# Patient Record
Sex: Male | Born: 1945
Health system: Southern US, Community
[De-identification: ages and names within clinical notes are randomized; demographics above are authoritative.]

## PROBLEM LIST (undated history)

## (undated) DIAGNOSIS — N189 Chronic kidney disease, unspecified: Secondary | ICD-10-CM

## (undated) DIAGNOSIS — G709 Myoneural disorder, unspecified: Secondary | ICD-10-CM

## (undated) DIAGNOSIS — E785 Hyperlipidemia, unspecified: Secondary | ICD-10-CM

## (undated) DIAGNOSIS — K635 Polyp of colon: Secondary | ICD-10-CM

## (undated) DIAGNOSIS — I639 Cerebral infarction, unspecified: Secondary | ICD-10-CM

## (undated) DIAGNOSIS — G4730 Sleep apnea, unspecified: Secondary | ICD-10-CM

## (undated) DIAGNOSIS — N400 Enlarged prostate without lower urinary tract symptoms: Secondary | ICD-10-CM

## (undated) DIAGNOSIS — K219 Gastro-esophageal reflux disease without esophagitis: Principal | ICD-10-CM

## (undated) DIAGNOSIS — I2510 Atherosclerotic heart disease of native coronary artery without angina pectoris: Secondary | ICD-10-CM

## (undated) DIAGNOSIS — G40909 Epilepsy, unspecified, not intractable, without status epilepticus: Secondary | ICD-10-CM

## (undated) DIAGNOSIS — H539 Unspecified visual disturbance: Secondary | ICD-10-CM

## (undated) DIAGNOSIS — M1990 Unspecified osteoarthritis, unspecified site: Secondary | ICD-10-CM

## (undated) DIAGNOSIS — T148XXA Other injury of unspecified body region, initial encounter: Secondary | ICD-10-CM

## (undated) DIAGNOSIS — K648 Other hemorrhoids: Secondary | ICD-10-CM

## (undated) DIAGNOSIS — F4329 Adjustment disorder with other symptoms: Secondary | ICD-10-CM

## (undated) DIAGNOSIS — E042 Nontoxic multinodular goiter: Secondary | ICD-10-CM

## (undated) DIAGNOSIS — I10 Essential (primary) hypertension: Secondary | ICD-10-CM

## (undated) DIAGNOSIS — K5790 Diverticulosis of intestine, part unspecified, without perforation or abscess without bleeding: Secondary | ICD-10-CM

## (undated) DIAGNOSIS — G4733 Obstructive sleep apnea (adult) (pediatric): Secondary | ICD-10-CM

## (undated) HISTORY — DX: Gastro-esophageal reflux disease without esophagitis: K21.9

## (undated) HISTORY — DX: Other hemorrhoids: K64.8

## (undated) HISTORY — DX: Cerebral infarction, unspecified: I63.9

## (undated) HISTORY — DX: Hyperlipidemia, unspecified: E78.5

## (undated) HISTORY — DX: Unspecified osteoarthritis, unspecified site: M19.90

## (undated) HISTORY — DX: Polyp of colon: K63.5

## (undated) HISTORY — DX: Sleep apnea, unspecified: G47.30

## (undated) HISTORY — DX: Diverticulosis of intestine, part unspecified, without perforation or abscess without bleeding: K57.90

## (undated) HISTORY — PX: CHOLECYSTECTOMY: SHX55

## (undated) HISTORY — DX: Unspecified visual disturbance: H53.9

## (undated) HISTORY — DX: Epilepsy, unspecified, not intractable, without status epilepticus: G40.909

## (undated) HISTORY — DX: Essential (primary) hypertension: I10

---

## 1997-11-13 DIAGNOSIS — E785 Hyperlipidemia, unspecified: Secondary | ICD-10-CM

## 1997-11-13 DIAGNOSIS — I10 Essential (primary) hypertension: Secondary | ICD-10-CM

## 1997-11-13 HISTORY — DX: Essential (primary) hypertension: I10

## 1997-11-13 HISTORY — DX: Hyperlipidemia, unspecified: E78.5

## 1999-03-01 ENCOUNTER — Encounter: Admission: RE | Admit: 1999-03-01 | Discharge: 1999-05-30 | Payer: Self-pay | Admitting: Cardiology

## 1999-06-15 ENCOUNTER — Encounter: Admission: RE | Admit: 1999-06-15 | Discharge: 1999-09-13 | Payer: Self-pay | Admitting: Internal Medicine

## 2002-11-15 ENCOUNTER — Emergency Department (HOSPITAL_COMMUNITY): Admission: EM | Admit: 2002-11-15 | Discharge: 2002-11-15 | Payer: Self-pay | Admitting: Emergency Medicine

## 2003-10-06 ENCOUNTER — Emergency Department (HOSPITAL_COMMUNITY): Admission: EM | Admit: 2003-10-06 | Discharge: 2003-10-06 | Payer: Self-pay | Admitting: Emergency Medicine

## 2003-11-14 DIAGNOSIS — K635 Polyp of colon: Secondary | ICD-10-CM

## 2003-11-14 HISTORY — DX: Polyp of colon: K63.5

## 2004-09-28 ENCOUNTER — Ambulatory Visit: Payer: Self-pay | Admitting: Internal Medicine

## 2004-10-20 ENCOUNTER — Ambulatory Visit: Payer: Self-pay | Admitting: Internal Medicine

## 2004-11-04 ENCOUNTER — Ambulatory Visit: Payer: Self-pay | Admitting: Internal Medicine

## 2006-03-16 ENCOUNTER — Ambulatory Visit (HOSPITAL_COMMUNITY): Admission: RE | Admit: 2006-03-16 | Discharge: 2006-03-16 | Payer: Self-pay | Admitting: Dermatology

## 2006-11-13 HISTORY — PX: KNEE SURGERY: SHX244

## 2007-07-05 ENCOUNTER — Ambulatory Visit (HOSPITAL_COMMUNITY): Admission: RE | Admit: 2007-07-05 | Discharge: 2007-07-05 | Payer: Self-pay | Admitting: Orthopedic Surgery

## 2007-07-31 ENCOUNTER — Ambulatory Visit (HOSPITAL_BASED_OUTPATIENT_CLINIC_OR_DEPARTMENT_OTHER): Admission: RE | Admit: 2007-07-31 | Discharge: 2007-07-31 | Payer: Self-pay | Admitting: Orthopedic Surgery

## 2007-08-13 ENCOUNTER — Encounter: Admission: RE | Admit: 2007-08-13 | Discharge: 2007-09-04 | Payer: Self-pay | Admitting: Orthopedic Surgery

## 2007-11-22 ENCOUNTER — Ambulatory Visit (HOSPITAL_COMMUNITY): Admission: RE | Admit: 2007-11-22 | Discharge: 2007-11-22 | Payer: Self-pay | Admitting: Dermatology

## 2008-01-07 ENCOUNTER — Ambulatory Visit (HOSPITAL_COMMUNITY): Admission: RE | Admit: 2008-01-07 | Discharge: 2008-01-07 | Payer: Self-pay | Admitting: Dermatology

## 2008-03-05 ENCOUNTER — Emergency Department (HOSPITAL_COMMUNITY): Admission: EM | Admit: 2008-03-05 | Discharge: 2008-03-05 | Payer: Self-pay | Admitting: Emergency Medicine

## 2008-03-27 ENCOUNTER — Ambulatory Visit (HOSPITAL_COMMUNITY): Admission: RE | Admit: 2008-03-27 | Discharge: 2008-03-27 | Payer: Self-pay | Admitting: Internal Medicine

## 2008-05-19 ENCOUNTER — Ambulatory Visit (HOSPITAL_COMMUNITY): Admission: RE | Admit: 2008-05-19 | Discharge: 2008-05-19 | Payer: Self-pay | Admitting: Internal Medicine

## 2008-05-21 ENCOUNTER — Ambulatory Visit (HOSPITAL_COMMUNITY): Admission: RE | Admit: 2008-05-21 | Discharge: 2008-05-21 | Payer: Self-pay | Admitting: Internal Medicine

## 2008-05-25 ENCOUNTER — Ambulatory Visit (HOSPITAL_COMMUNITY): Admission: RE | Admit: 2008-05-25 | Discharge: 2008-05-25 | Payer: Self-pay | Admitting: Internal Medicine

## 2008-06-25 ENCOUNTER — Emergency Department (HOSPITAL_COMMUNITY): Admission: EM | Admit: 2008-06-25 | Discharge: 2008-06-25 | Payer: Self-pay | Admitting: Emergency Medicine

## 2008-06-29 ENCOUNTER — Emergency Department (HOSPITAL_COMMUNITY): Admission: EM | Admit: 2008-06-29 | Discharge: 2008-06-29 | Payer: Self-pay | Admitting: Emergency Medicine

## 2008-09-08 ENCOUNTER — Emergency Department (HOSPITAL_COMMUNITY): Admission: EM | Admit: 2008-09-08 | Discharge: 2008-09-08 | Payer: Self-pay | Admitting: Emergency Medicine

## 2009-02-06 ENCOUNTER — Emergency Department (HOSPITAL_COMMUNITY): Admission: EM | Admit: 2009-02-06 | Discharge: 2009-02-06 | Payer: Self-pay | Admitting: Emergency Medicine

## 2009-02-10 ENCOUNTER — Emergency Department (HOSPITAL_COMMUNITY): Admission: EM | Admit: 2009-02-10 | Discharge: 2009-02-10 | Payer: Self-pay | Admitting: Emergency Medicine

## 2009-06-28 ENCOUNTER — Emergency Department (HOSPITAL_COMMUNITY): Admission: EM | Admit: 2009-06-28 | Discharge: 2009-06-28 | Payer: Self-pay | Admitting: Family Medicine

## 2009-06-29 ENCOUNTER — Emergency Department (HOSPITAL_COMMUNITY): Admission: EM | Admit: 2009-06-29 | Discharge: 2009-06-29 | Payer: Self-pay | Admitting: Emergency Medicine

## 2009-07-30 ENCOUNTER — Emergency Department (HOSPITAL_COMMUNITY): Admission: EM | Admit: 2009-07-30 | Discharge: 2009-07-30 | Payer: Self-pay | Admitting: Emergency Medicine

## 2009-07-30 ENCOUNTER — Ambulatory Visit: Payer: Self-pay | Admitting: Vascular Surgery

## 2009-07-30 ENCOUNTER — Encounter (INDEPENDENT_AMBULATORY_CARE_PROVIDER_SITE_OTHER): Payer: Self-pay | Admitting: Emergency Medicine

## 2009-08-17 ENCOUNTER — Ambulatory Visit (HOSPITAL_COMMUNITY): Admission: RE | Admit: 2009-08-17 | Discharge: 2009-08-17 | Payer: Self-pay | Admitting: Diagnostic Neuroimaging

## 2009-09-24 ENCOUNTER — Ambulatory Visit (HOSPITAL_COMMUNITY): Admission: RE | Admit: 2009-09-24 | Discharge: 2009-09-24 | Payer: Self-pay | Admitting: Diagnostic Neuroimaging

## 2009-10-27 ENCOUNTER — Encounter: Payer: Self-pay | Admitting: Internal Medicine

## 2009-10-27 ENCOUNTER — Telehealth: Payer: Self-pay | Admitting: Internal Medicine

## 2009-11-04 ENCOUNTER — Ambulatory Visit: Payer: Self-pay | Admitting: Internal Medicine

## 2009-11-04 DIAGNOSIS — K625 Hemorrhage of anus and rectum: Secondary | ICD-10-CM

## 2009-11-04 DIAGNOSIS — Z860100 Personal history of colon polyps, unspecified: Secondary | ICD-10-CM | POA: Insufficient documentation

## 2009-11-04 DIAGNOSIS — E119 Type 2 diabetes mellitus without complications: Secondary | ICD-10-CM

## 2009-11-04 DIAGNOSIS — K5900 Constipation, unspecified: Secondary | ICD-10-CM | POA: Insufficient documentation

## 2009-12-16 ENCOUNTER — Encounter (INDEPENDENT_AMBULATORY_CARE_PROVIDER_SITE_OTHER): Payer: Self-pay | Admitting: *Deleted

## 2009-12-24 ENCOUNTER — Ambulatory Visit: Payer: Self-pay | Admitting: Internal Medicine

## 2009-12-24 ENCOUNTER — Encounter (INDEPENDENT_AMBULATORY_CARE_PROVIDER_SITE_OTHER): Payer: Self-pay | Admitting: *Deleted

## 2009-12-28 ENCOUNTER — Ambulatory Visit: Payer: Self-pay | Admitting: Internal Medicine

## 2009-12-28 HISTORY — PX: COLONOSCOPY: SHX174

## 2010-01-10 ENCOUNTER — Encounter: Payer: Self-pay | Admitting: Cardiology

## 2010-01-10 ENCOUNTER — Encounter (INDEPENDENT_AMBULATORY_CARE_PROVIDER_SITE_OTHER): Payer: Self-pay | Admitting: *Deleted

## 2010-01-11 ENCOUNTER — Encounter (INDEPENDENT_AMBULATORY_CARE_PROVIDER_SITE_OTHER): Payer: Self-pay | Admitting: Internal Medicine

## 2010-01-11 ENCOUNTER — Inpatient Hospital Stay (HOSPITAL_COMMUNITY): Admission: RE | Admit: 2010-01-11 | Discharge: 2010-01-14 | Payer: Self-pay | Admitting: Internal Medicine

## 2010-01-11 ENCOUNTER — Encounter (INDEPENDENT_AMBULATORY_CARE_PROVIDER_SITE_OTHER): Payer: Self-pay | Admitting: *Deleted

## 2010-01-12 ENCOUNTER — Encounter (INDEPENDENT_AMBULATORY_CARE_PROVIDER_SITE_OTHER): Payer: Self-pay | Admitting: *Deleted

## 2010-01-20 ENCOUNTER — Encounter (INDEPENDENT_AMBULATORY_CARE_PROVIDER_SITE_OTHER): Payer: Self-pay | Admitting: *Deleted

## 2010-05-10 ENCOUNTER — Encounter: Payer: Self-pay | Admitting: Cardiology

## 2010-05-10 ENCOUNTER — Emergency Department (HOSPITAL_COMMUNITY): Admission: EM | Admit: 2010-05-10 | Discharge: 2010-05-10 | Payer: Self-pay | Admitting: Emergency Medicine

## 2010-05-20 ENCOUNTER — Encounter: Payer: Self-pay | Admitting: Cardiology

## 2010-05-25 ENCOUNTER — Emergency Department (HOSPITAL_COMMUNITY): Admission: EM | Admit: 2010-05-25 | Discharge: 2010-05-25 | Payer: Self-pay | Admitting: Emergency Medicine

## 2010-06-01 ENCOUNTER — Ambulatory Visit: Payer: Self-pay | Admitting: Cardiology

## 2010-06-01 DIAGNOSIS — I10 Essential (primary) hypertension: Secondary | ICD-10-CM

## 2010-06-01 DIAGNOSIS — R0602 Shortness of breath: Secondary | ICD-10-CM

## 2010-06-01 DIAGNOSIS — E785 Hyperlipidemia, unspecified: Secondary | ICD-10-CM | POA: Insufficient documentation

## 2010-07-22 ENCOUNTER — Ambulatory Visit: Payer: Self-pay

## 2010-07-22 ENCOUNTER — Ambulatory Visit: Payer: Self-pay | Admitting: Cardiology

## 2010-08-03 ENCOUNTER — Telehealth (INDEPENDENT_AMBULATORY_CARE_PROVIDER_SITE_OTHER): Payer: Self-pay | Admitting: *Deleted

## 2010-08-04 ENCOUNTER — Ambulatory Visit: Payer: Self-pay

## 2010-08-04 ENCOUNTER — Encounter: Payer: Self-pay | Admitting: Internal Medicine

## 2010-08-04 ENCOUNTER — Ambulatory Visit: Payer: Self-pay | Admitting: Internal Medicine

## 2010-08-04 ENCOUNTER — Encounter (HOSPITAL_COMMUNITY): Admission: RE | Admit: 2010-08-04 | Discharge: 2010-10-14 | Payer: Self-pay | Admitting: Cardiology

## 2010-12-01 ENCOUNTER — Encounter (INDEPENDENT_AMBULATORY_CARE_PROVIDER_SITE_OTHER): Payer: Self-pay | Admitting: *Deleted

## 2010-12-05 ENCOUNTER — Ambulatory Visit
Admission: RE | Admit: 2010-12-05 | Discharge: 2010-12-05 | Payer: Self-pay | Source: Home / Self Care | Attending: Internal Medicine | Admitting: Internal Medicine

## 2010-12-05 ENCOUNTER — Encounter: Payer: Self-pay | Admitting: Internal Medicine

## 2010-12-05 DIAGNOSIS — R142 Eructation: Secondary | ICD-10-CM

## 2010-12-05 DIAGNOSIS — R141 Gas pain: Secondary | ICD-10-CM | POA: Insufficient documentation

## 2010-12-05 DIAGNOSIS — R143 Flatulence: Secondary | ICD-10-CM

## 2010-12-05 DIAGNOSIS — R1084 Generalized abdominal pain: Secondary | ICD-10-CM | POA: Insufficient documentation

## 2010-12-05 DIAGNOSIS — R1012 Left upper quadrant pain: Secondary | ICD-10-CM | POA: Insufficient documentation

## 2010-12-07 ENCOUNTER — Other Ambulatory Visit: Payer: Self-pay | Admitting: Internal Medicine

## 2010-12-07 ENCOUNTER — Ambulatory Visit
Admission: RE | Admit: 2010-12-07 | Discharge: 2010-12-07 | Payer: Self-pay | Source: Home / Self Care | Attending: Internal Medicine | Admitting: Internal Medicine

## 2010-12-07 LAB — GLUCOSE, CAPILLARY
Glucose-Capillary: 179 mg/dL — ABNORMAL HIGH (ref 70–99)
Glucose-Capillary: 163 mg/dL — ABNORMAL HIGH (ref 70–99)

## 2010-12-12 ENCOUNTER — Encounter: Payer: Self-pay | Admitting: Internal Medicine

## 2010-12-13 NOTE — Letter (Signed)
Summary: Moviprep Instructions  [facility]  [address]   [place], [place] [address]   Phone: [phone]  Fax: [phone]       JATAVIUS Ziska    04-14-1964    MRN: [id number]        Procedure Day /Date: 12-28-09 Tuesday     Arrival Time: 7:30 a.m.      Procedure Time: 8:30 a.m.     Location of Procedure:                    x   [facility] (4th Floor)                        PREPARATION FOR COLONOSCOPY WITH MOVIPREP   Starting 5 days prior to your procedure 12-23-09  do not eat nuts, seeds, popcorn, corn, beans, peas,  salads, or any raw vegetables.  Do not take any fiber supplements (e.g. Metamucil, Citrucel, and Benefiber).  THE DAY BEFORE YOUR PROCEDURE         DATE: 12-27-09    DAY: Monday  1.  Drink clear liquids the entire day-NO SOLID FOOD  2.  Do not drink anything colored red or purple.  Avoid juices with pulp.  No orange juice.  3.  Drink at least 64 oz. (8 glasses) of fluid/clear liquids during the day to prevent dehydration and help the prep work efficiently.  CLEAR LIQUIDS INCLUDE: Water Jello Ice Popsicles Tea (sugar ok, no milk/cream) Powdered fruit flavored drinks Coffee (sugar ok, no milk/cream) Gatorade Juice: Prather, white grape, white cranberry  Lemonade Clear bullion, consomm, broth Carbonated beverages (any kind) Strained chicken noodle soup Hard Candy                             4.  In the morning, mix first dose of MoviPrep solution:    Empty 1 Pouch A and 1 Pouch B into the disposable container    Add lukewarm drinking water to the top line of the container. Mix to dissolve    Refrigerate (mixed solution should be used within 24 hrs)  5.  Begin drinking the prep at 5:00 p.m. The MoviPrep container is divided by 4 marks.   Every 15 minutes drink the solution down to the next mark (approximately 8 oz) until the full liter is complete.   6.  Follow completed prep with 16 oz of clear liquid of your choice  (Nothing red or purple).  Continue to drink clear liquids until bedtime.  7.  Before going to bed, mix second dose of MoviPrep solution:    Empty 1 Pouch A and 1 Pouch B into the disposable container    Add lukewarm drinking water to the top line of the container. Mix to dissolve    Refrigerate  THE DAY OF YOUR PROCEDURE      DATE: 12-28-09   DAY: Tuesday  Beginning at 3:30 a.m. (5 hours before procedure):         1. Every 15 minutes, drink the solution down to the next mark (approx 8 oz) until the full liter is complete.  2. Follow completed prep with 16 oz. of clear liquid of your choice.    3. You may drink clear liquids until 6:30 a.m.   (2 HOURS BEFORE PROCEDURE).   MEDICATION INSTRUCTIONS  Unless otherwise instructed, you should take regular prescription medications with a small sip of water  as early as possible the morning of your procedure.  Diabetic patients - see separate instructions.   Additional medication instructions: Be sure to take Dilantin the morning of procedure.         OTHER INSTRUCTIONS  You will need a responsible adult at least 65 years of age to accompany you and drive you home.   This person must remain in the waiting room during your procedure.  Wear loose fitting clothing that is easily removed.  Leave jewelry and other valuables at home.  However, you may wish to bring a book to read or  an iPod/MP3 player to listen to music as you wait for your procedure to start.  Remove all body piercing jewelry and leave at home.  Total time from sign-in until discharge is approximately 2-3 hours.  You should go home directly after your procedure and rest.  You can resume normal activities the  day after your procedure.  The day of your procedure you should not:   Drive   Make legal decisions   Operate machinery   Drink alcohol   Return to work  You will receive specific instructions about eating, activities and medications before you  leave.    The above instructions have been reviewed and explained to me by   Wyona Almas RN  December 24, 2009 4:37 PM     I fully understand and can verbalize these instructions _____________________________ Date _________

## 2010-12-13 NOTE — Procedures (Signed)
Summary: Colonoscopy  Patient: Arthur Berry Note: All result statuses are Final unless otherwise noted.  Tests: (1) Colonoscopy (COL)   COL Colonoscopy           DONE     [facility]     [address]     [place], [place]  [address]           COLONOSCOPY PROCEDURE REPORT           PATIENT:  Arthur, Berry  MR#:  [id number]     BIRTHDATE:  [birth year], 63 yrs. old  GENDER:  male           ENDOSCOPIST:  Wilhemina Bonito. Eda Keys, MD     Referred by:  Surveillance Program Recall,           PROCEDURE DATE:  12/28/2009     PROCEDURE:  Surveillance Colonoscopy     ASA CLASS:  Class II     INDICATIONS:  rectal bleeding, history of pre-cancerous     (adenomatous) colon polyps (index exam 10-2004)           MEDICATIONS:   Fentanyl 50 mcg IV, Versed 5 mg IV           DESCRIPTION OF PROCEDURE:   After the risks benefits and     alternatives of the procedure were thoroughly explained, informed     consent was obtained.  Digital rectal exam was performed and     revealed no abnormalities.   The LB CF-[id number] endoscope     was introduced through the anus and advanced to the cecum, which     was identified by both the appendix and ileocecal valve, without     limitations.Time to cecum = 2:50 min.  The quality of the prep was     good, using MoviPrep.  The instrument was then slowly withdrawn     (time = 12:21 min) as the colon was fully examined.     <<PROCEDUREIMAGES>>           FINDINGS:  Mild diverticulosis was found in the sigmoid colon.     This was otherwise a normal examination of the colon.  No polyps or     cancers were seen.   Retroflexed views in the rectum revealed     internal hemorrhoids.    The scope was then withdrawn from the     patient and the procedure completed.           COMPLICATIONS:  None           ENDOSCOPIC IMPRESSION:     1) Mild diverticulosis in the sigmoid colon     2) Otherwise normal examination     3) No polyps or cancers     4) Internal  hemorrhoids           RECOMMENDATIONS:     1) Follow up colonoscopy in 5 years           ______________________________     Wilhemina Bonito. Eda Keys, MD           CC:  Rodrigo Ran, MD; The Patient           n.     eSIGNED:   Wilhemina Bonito. Eda Keys at 12/28/2009 09:42 AM           Kitchen, Remi Deter, [id number]  Note: An exclamation mark (!) indicates a result that was not dispersed into the flowsheet. Document Creation Date: 12/28/2009 9:42 AM  _______________________________________________________________________  (1) Order result status: Final Collection or observation date-time: 12/28/2009 09:37 Requested date-time:  Receipt date-time:  Reported date-time:  Referring Physician:   Ordering Physician: Fransico Setters [phone]) Specimen Source:  Source: Launa Grill Order Number: [id number] Lab site:   Appended Document: Colonoscopy    Clinical Lists Changes  Observations: Added new observation of COLONNXTDUE: 12/2014 (12/28/2009 11:44)

## 2010-12-13 NOTE — Letter (Signed)
Summary: Diabetic Instructions  [facility]  [address] [place], [place] [address]   Phone: [phone]  Fax: [phone]    GILMER KAMINSKY 05/10/1946 MRN: [id number]   _X _   ORAL DIABETIC MEDICATION INSTRUCTIONS  The day before your procedure:   Take your diabetic pill as you do normally  The day of your procedure:   Do not take your diabetic pill    We will check your blood sugar levels during the admission process and again in Recovery before discharging you home  ________________________________________________________________________

## 2010-12-13 NOTE — Letter (Signed)
Summary: [facility]   Imported By: Marylou Mccoy 06/15/2010 11:04:17  _____________________________________________________________________  External Attachment:    Type:   Image     Comment:   External Document

## 2010-12-13 NOTE — Assessment & Plan Note (Signed)
Summary: np6/chest pain/risk factors   Visit Type:  Initial Consult Primary Provider:  Rodrigo Ran, MD  CC:  Diabeted, HTN, and Hyperlipidemia.  History of Present Illness: The patient presents for consultation. He has no prior cardiac history but multiple cardiovascular risk factors. He has had no prior cardiovascular testing.  He works at [employer].  With this he does some walking including stairs. He may get dyspneic climbing the stairs mildly. However, he has no chest discomfort, neck or arm discomfort. He does not report any palpitations, presyncope or syncope. He is not complaining of weight gain. He does have some mild lower extremity edema when he is on his feet. He has sleep apnea and sleeps with CPAP and on 2 pillows but is not describing PND or orthopnea.  Current Medications (verified): 1)  Aspirin 81 Mg Tabs (Aspirin) .Marland Kitchen.. 1 Tablet By Mouth Once Daily 2)  Phenytoin Sodium Extended 100 Mg Caps (Phenytoin Sodium Extended) .Marland Kitchen.. 1 Capsule By Mouth Three Times A Day 3)  Glyburide-Metformin 1.25-250 Mg Tabs (Glyburide-Metformin) .Marland Kitchen.. 1 Tablet By Mouth Two Times A Day 4)  Benazepril Hcl 40 Mg Tabs (Benazepril Hcl) .... One Tablet By Mouth Once Daily 5)  Lipitor 20 Mg Tabs (Atorvastatin Calcium) .... One Tablet By Mouth Once Daily At Bedtime 6)  Vitamin D 1000 Unit Tabs (Cholecalciferol) .... One Tablet By Mouth Once Daily 7)  Niacin 250 Mg Tabs (Niacin) .... One Tablet By Mouth Once Daily 8)  Fish Oil   Oil (Fish Oil) .... One Tablet By Mouth Once Daily 9)  Keppra 500 Mg Tabs (Levetiracetam) .Marland Kitchen.. 1 By Mouth Two Times A Day 10)  Flomax 0.4 Mg Caps (Tamsulosin Hcl) .Marland Kitchen.. 1 By Mouth Dialy  Allergies (verified): No Known Drug Allergies  Past History:  Past Medical History: Colon Polyp-Tubular Adenoma Diabetes 2001 Hypertension 1999 Arthritis Hyperlipidemia 1999 Epilepsy Sleep apnea  Past Surgical History: Left Knee Surgery  Cholecystectomy  Family History: Reviewed history  from 11/04/2009 and no changes required. Family History of Breast Cancer:Older Sister  No FH of Colon Cancer: Family History of Diabetes: Older Sister   Social History: Occupation: Museum/gallery exhibitions officer Divorced No childern Alcohol Use - no Patient is a former smoker. (Quit cigars in the 1980s)  Review of Systems       As stated in the HPI and negative for all other systems.   Vital Signs:  Patient profile:   65 year old male Height:      71 inches Weight:      203 pounds BMI:     28.42 Pulse rate:   78 / minute Resp:     16 per minute BP sitting:   150 / 86  (right arm)  Vitals Entered By: Marrion Coy, CNA (June 01, 2010 11:26 AM)  Physical Exam  General:  Well developed, well nourished, in no acute distress. Head:  normocephalic and atraumatic Eyes:  PERRLA/EOM intact; conjunctiva and lids normal. Mouth:  Teeth, gums and palate normal. Oral mucosa normal. Neck:  Neck supple, no JVD. No masses, thyromegaly or abnormal cervical nodes. Chest Wall:  no deformities or breast masses noted Lungs:  Clear bilaterally to auscultation and percussion. Abdomen:  Bowel sounds positive; abdomen soft and non-tender without masses, organomegaly, or hernias noted. No hepatosplenomegaly. Msk:  Back normal, normal gait. Muscle strength and tone normal. Extremities:  No clubbing or cyanosis. Neurologic:  Alert and oriented x 3. Skin:  Intact without lesions or rashes. Cervical Nodes:  no significant adenopathy  Axillary Nodes:  no significant adenopathy Inguinal Nodes:  no significant adenopathy Psych:  Normal affect.   Detailed Cardiovascular Exam  Neck    Carotids: Carotids full and equal bilaterally without bruits.      Neck Veins: Normal, no JVD.    Heart    Inspection: no deformities or lifts noted.      Palpation: normal PMI with no thrills palpable.      Auscultation: regular rate and rhythm, S1, S2 without murmurs, rubs, gallops, or clicks.    Vascular     Abdominal Aorta: no palpable masses, pulsations, or audible bruits.      Femoral Pulses: normal femoral pulses bilaterally.      Pedal Pulses: normal pedal pulses bilaterally.      Radial Pulses: normal radial pulses bilaterally.      Peripheral Circulation: no clubbing, cyanosis, or edema noted with normal capillary refill.     EKG  Procedure date:  05/10/2010  Findings:      Sinus rhythm, rate 92, questionable left atrial enlargement, axis within normal limits, intervals within normal limits, no acute ST-T wave changes.  Impression & Recommendations:  Problem # 1:  DYSPNEA (ICD-786.05) The patient isn't overly active though he does some walking at work. He may have some mild dyspnea climbing a flight of stairs. He has significant long-standing cardiovascular risk factors. Therefore, screening with an exercise treadmill test is indicated. This will allow me to rule out obstructive coronary disease with a reasonable degree of certainty, risk stratify and give him a prescription for exercise.  Problem # 2:  ESSENTIAL HYPERTENSION, BENIGN (ICD-401.1) He has a slightly elevated blood pressure. However, he is in the process of keeping a blood pressure diary for Dr. Waynard Edwards and I will defer to his management.  Certainly increasing exercise and losing a couple pounds will help in addition to any medication changes.  Problem # 3:  DYSLIPIDEMIA (ICD-272.4) He is on combination therapy and I suspect is having aggressive management. I will defer to his primary provider.  Other Orders: Treadmill (Treadmill)  Patient Instructions: 1)  Your physician recommends that you schedule a follow-up appointment at the time of your treadmill 2)  Your physician recommends that you continue on your current medications as directed. Please refer to the Current Medication list given to you today.

## 2010-12-13 NOTE — Miscellaneous (Signed)
Summary: [facility] Previsit/prep  Clinical Lists Changes  Observations: Added new observation of NKA: T (12/24/2009 16:12)

## 2010-12-13 NOTE — Progress Notes (Signed)
Summary: Nuclear Pre-Procedure  Phone Note Outgoing Call Call back at [facility] Phone [phone]   Call placed by: Stanton Kidney, EMT-P,  August 03, 2010 3:19 PM Action Taken: Phone Call Completed Summary of Call: Left message with information on [employer] Information Sheet (see scanned document for details).     Nuclear Med Background Indications for Stress Test: Evaluation for Ischemia  Indications Comments: Abnormal GXT  History: GXT  History Comments: 07/22/10 GXT: (+)  Symptoms: DOE    Nuclear Pre-Procedure Cardiac Risk Factors: Hypertension, Lipids, NIDDM Height (in): 71

## 2010-12-13 NOTE — Assessment & Plan Note (Signed)
Summary: Cardiology Nuclear Testing  Nuclear Med Background Indications for Stress Test: Evaluation for Ischemia  Indications Comments: Abnormal GXT  History: GXT  History Comments: 07/22/10 GXT: (+)  Symptoms: DOE    Nuclear Pre-Procedure Cardiac Risk Factors: Hypertension, Lipids, NIDDM Caffeine/Decaff Intake: None NPO After: 7:00 PM Lungs: clear IV 0.9% NS with Angio Cath: 22g     IV Site: R Wrist IV Started by: Irean Hong, RN Chest Size (in) 44     Height (in): 70.5 Weight (lb): 207 BMI: 29.39  Nuclear Med Study 1 or 2 day study:  1 day     Stress Test Type:  Stress Reading MD:  Arvilla Meres, MD     Referring MD:  Shela Commons.Hochrein Resting Radionuclide:  Technetium 9m Tetrofosmin     Resting Radionuclide Dose:  11 mCi  Stress Radionuclide:  Technetium 51m Tetrofosmin     Stress Radionuclide Dose:  32.9 mCi   Stress Protocol Exercise Time (min):  7:01 min     Max HR:  162 bpm     Predicted Max HR:  156 bpm  Max Systolic BP: 206 mm Hg     Percent Max HR:  103.85 %     METS: 8.5 Rate Pressure Product:  03474    Stress Test Technologist:  Milana Na, EMT-P     Nuclear Technologist:  Domenic Polite, CNMT  Rest Procedure  Myocardial perfusion imaging was performed at rest 45 minutes following the intravenous administration of Technetium 11m Tetrofosmin.  Stress Procedure  The patient exercised for 7:01. The patient stopped due to fatigue and chest pressue.  There were + significant ST-T wave changes.  Technetium 23m Tetrofosmin was injected at peak exercise and myocardial perfusion imaging was performed after a brief delay.  QPS Raw Data Images:  Normal; no motion artifact; normal heart/lung ratio. Stress Images:  Normal homogeneous uptake in all areas of the myocardium. Rest Images:  Normal homogeneous uptake in all areas of the myocardium. Subtraction (SDS):  Normal Transient Ischemic Dilatation:  1.02  (Normal <1.22)  Lung/Heart Ratio:  .32  (Normal  <0.45)  Quantitative Gated Spect Images QGS EDV:  98 ml QGS ESV:  39 ml QGS EF:  61 % QGS cine images:  Normal  Findings Normal nuclear study      Overall Impression  Exercise Capacity: Fair exercise capacity. BP Response: Hypertensive blood pressure response. Clinical Symptoms: 5/10 CP ECG Impression: Nondiagnostic upsloping ST segment depression. Overall Impression: Normal stress nuclear study.  Appended Document: Cardiology Nuclear Testing pt aware

## 2010-12-13 NOTE — Letter (Signed)
Summary: [facility]   Imported By: Marylou Mccoy 06/15/2010 11:30:24  _____________________________________________________________________  External Attachment:    Type:   Image     Comment:   External Document

## 2010-12-15 NOTE — Discharge Summary (Signed)
Summary: Discharge Summary    NAME:  Arthur Berry, Arthur Berry NO.:  [id number]      MEDICAL RECORD NO.:  [id number]          PATIENT TYPE:  INP      LOCATION:  1302                         FACILITY:  [facility]      PHYSICIAN:  Mark A. Perini, M.D.   DATE OF BIRTH:  11-Sep-1946      DATE OF ADMISSION:  01/10/2010   DATE OF DISCHARGE:  01/14/2010                                  DISCHARGE SUMMARY      DISCHARGE DIAGNOSES:   1. Acute cholecystitis status post laparoscopic cholecystectomy this       admission.   2. Seizure disorder, stable at this time.   3. Type 2 diabetes.   4. Hypertension.   5. Hyperlipidemia.   6. Microalbuminuria.   7. Protein calorie malnutrition.   8. Anemia, mild.   9. Hypokalemia replaced this admission.   10.Benign prostatic hypertrophy.   11.Osteoarthritis.   12.Depression.   13.Known right kidney stone.      PROCEDURE:  General surgery consultation and on January 11, 2010   laparoscopic cholecystectomy.      OTHER PROCEDURES:  A CT scan of the abdomen and pelvis on January 10, 2010 showing acute cholecystitis with several large stones in the right   kidney and small bowel malrotation.      DISCHARGE MEDICATIONS:   1. Tylenol 650 mg every 6 hours as needed.   2. Lipitor 1/2 of a 40 mg pill daily.   3. Tamsulosin 0.4 mg once daily.   4. Align over-the-counter 1 daily for 2 weeks, then stop.   5. Benazepril 1/2 of a 40 mg pill daily.   6. Lexapro 1/2 of a 10 mg pill daily.   7. Aspirin 81 mg daily.   8. Chlordiazepoxide 10 mg 1 pill twice daily.   9. Over-the-counter omega-3 fish oil 1000 mg 1 pill daily.  He may       resume this in a couple weeks.   10.Glyburide 1.25/metformin 250 one pill twice daily with food.  He is       to check his sugars twice daily before breakfast and supper and       especially watch for any hypoglycemia.   11.Niacin over-the-counter 250 mg 1 pill daily.  He should wait 2       weeks to resume this.   12.Phenytoin extended release 100 mg 1 pill 3 times daily.   13.Vitamin D 1000 units daily.   14.Vicodin 5/500 1-2 pills every 6 hours as needed for pain.      HISTORY OF PRESENT ILLNESS:  Arthur Berry is a pleasant 65 year old gentleman   who presented with 3-4 days of nausea, vomiting, and diarrhea.  He also   noted gross hematuria.  He was actually seen in the office for gross   hematuria.  He felt totally wiped out.  He denied any fevers, but he did   have a fever early in his course.  He was keeping some oral food down on   the day of  admission.  His last bowel movement was 4 days prior to   admission.  He had diarrhea until 24 hours prior to admission.  He did   have some upper back pain and some pain in his flanks bilaterally.  He   was initially felt that he may have some sort of kidney stone problem   with his gross hematuria.  However, CT scan done later that day showed   acute cholecystitis, and he was therefore admitted for further care.      HOSPITAL COURSE:  Arthur Berry was admitted to a regular bed.  He was felt to   be cleared medically for surgery from a cardiac standpoint and medical   standpoint.  He tolerated his surgery well.  Fortunately he was able to   resume his pills quickly, and had no seizure problems during his stay.   He gradually improved and his bowel function returned, and his diet was   gradually advanced.  He did require the pain pump until the day prior to   discharge.  However, on January 14, 2010 he was deemed stable for discharge   home.  He did have a total of 4 days of Zosyn while in the hospital, but   he did not need any further antibiotics upon discharge.      DISCHARGE PHYSICAL EXAM:  Temperature 98.7, afebrile, pulse 79,   respiratory rate 20, blood pressure 129/73, blood sugars ranged from 113-   217, 96% oxygen saturation on room air.   He was in no acute distress.  Alert and oriented x4.   LUNGS:  Clear to auscultation bilaterally with no wheezes, rales  or   rhonchi.   HEART:  Was regular rate and rhythm with no murmur, rub or gallop.   ABDOMEN:  Soft, nontender, nondistended with no mass or   hepatosplenomegaly.  There was no edema.      NOTABLE DISCHARGE LABORATORY DATA:  Sodium 141, potassium 3.3, chloride   106, CO2 29, BUN 10, creatinine 0.9.  GFR greater than 60.  Glucose 79.   Total bili 0.7, alk phos 80.  AST 23, ALT 20.  Total protein 5.8.   Albumin 2.4.  Calcium 8.2.  White count 8.1 with 60% segs, 19%   lymphocytes, 12% monocytes, hemoglobin 11.3, platelet count 175,000.   Hemoglobin A1c was 7.0 this admission.  Dilantin level was 11.9 on January 11, 2010.      DISCHARGE INSTRUCTIONS:  Arthur Berry is to increase his activity slowly.  He   is to follow up with Dr. Wenda Low in 1-2 weeks.  He is to follow up   with Dr. Waynard Edwards in 2 weeks.  He is to call if he his any recurrent   problems.  DISCHARGE LABORATORY DATA/>               Redge Gainer. Waynard Edwards, M.D.            MAP/MEDQ  D:  01/20/2010  T:  01/20/2010  Job:  [id number]      Electronically Signed by Rodrigo Ran M.D. on 01/21/2010 06:23:13 PM

## 2010-12-15 NOTE — Procedures (Addendum)
Summary: Upper Endoscopy  Patient: [name] Note: All result statuses are Final unless otherwise noted.  Tests: (1) Upper Endoscopy (EGD)   EGD Upper Endoscopy       DONE     [facility]     [address]     [place], [place]  [address]           ENDOSCOPY PROCEDURE REPORT           PATIENT:  Arthur Berry, Arthur Berry  MR#:  [id number]     BIRTHDATE:  05-Dec-1945, 64 yrs. old  GENDER:  male           ENDOSCOPIST:  Wilhemina Bonito. Eda Keys, MD     Referred by:  Office           PROCEDURE DATE:  12/07/2010     PROCEDURE:  EGD with biopsy, [id number]     ASA CLASS:  Class II     INDICATIONS:  abdominal pain, left upper quadrant, bloating           MEDICATIONS:   Fentanyl 50 mcg IV, Versed 5 mg IV     TOPICAL ANESTHETIC:  Exactacain Spray           DESCRIPTION OF PROCEDURE:   After the risks benefits and     alternatives of the procedure were thoroughly explained, informed     consent was obtained.  The LB GIF-H180 [id number] endoscope was     introduced through the mouth and advanced to the second portion of     the duodenum, without limitations.  The instrument was slowly     withdrawn as the mucosa was fully examined.     <<PROCEDUREIMAGES>>           The upper, middle, and distal third of the esophagus were     carefully inspected and no abnormalities were noted. The z-line     was well seen at the GEJ. The endoscope was pushed into the fundus     which was normal including a retroflexed view. The antrum,gastric     body, first and second part of the duodenum were unremarkable. Bx     of the duodenum taken.   Retroflexed views revealed no     abnormalities.    The scope was then withdrawn from the patient     and the procedure completed.           COMPLICATIONS:  None           ENDOSCOPIC IMPRESSION:     1) Normal EGD     RECOMMENDATIONS:     1) Await biopsy results     2) Continue PPI     3) Follow up in the office in 4-6 weeks if needed            ______________________________     Wilhemina Bonito. Eda Keys, MD           CC:  Rodrigo Ran, MD; The Patient           n.     eSIGNED:   Wilhemina Bonito. Eda Keys at 12/07/2010 10:13 AM           Ganas, Remi Deter, [phone]  Note: An exclamation mark (!) indicates a result that was not dispersed into the flowsheet. Document Creation Date: 12/07/2010 10:14 AM _______________________________________________________________________  (1) Order result status: Final Collection or observation date-time: 12/07/2010 10:07 Requested date-time:  Receipt date-time:  Reported date-time:  Referring Physician:  Ordering Physician: Fransico Setters [phone]) Specimen Source:  Source: Launa Grill Order Number: [id number] Lab site:

## 2010-12-15 NOTE — Op Note (Signed)
Summary: Operative Report    NAME:  MONTREAL, STEIDLE                ACCOUNT NO.:  [id number]      MEDICAL RECORD NO.:  [id number]          PATIENT TYPE:  INP      LOCATION:  1302                         FACILITY:  [facility]      PHYSICIAN:  Thornton Park. Daphine Deutscher, MD  DATE OF BIRTH:  [birth year]      DATE OF PROCEDURE:  01/11/2010   DATE OF DISCHARGE:                                  OPERATIVE REPORT      PREOPERATIVE DIAGNOSIS:  Acute cholecystitis      POSTOPERATIVE DIAGNOSIS:  Acute gangrenous cholecystitis with   gallbladder walled off with large omental pack, normal intraoperative   cholangiogram.      PROCEDURE:  Laparoscopic cholecystectomy with intraoperative   cholangiogram.      ASSISTANT:  Sharlet Salina T. Hoxworth, M.D.      DESCRIPTION OF PROCEDURE:  Mr. Sibert was taken to room 6 on the evening   of January 11, 2010, given general anesthesia.  The abdomen was prepped   with a chlorhexidine and alcohol based prep.  The abdomen was entered   through the left upper quadrant using a 5 mm Optiview and then I placed   4, 5s and changed out one of the 5s for a 10 and had a camera below the   umbilicus, operated through the left upper quadrant across to another in   the midline and then Dr. Johna Sheriff had two over on the right.  The   gallbladder was taken down from the omental packing and looked like an   avocado in that it was black and green.  I scaled away the big omental   pack and eventually decompressed it.  I worked for a long time to   dissect free, this was very difficult anatomy. I stripped it away and   identified cystic artery running up on the gallbladder.  I clipped the   __________ on the gallbladder peeled it away and followed it down to   find the cystic duct.  I dissected that free, put a clip on the   gallbladder, incised it, inserted a Reddick catheter and took a dynamic   cholangiogram.  This showed a modest amount of cystic duct leaving which   was kind of tortuous but  filled the common duct, free flow in the   duodenum.  I then double clipped this stump, removed the gallbladder   with hook and a spatula with a lot of difficulty because of the   inflammation, but eventually removed it and put in a bag and brought it   out through the 10 mm trocar site.      No bleeding or bile leaks noted up in the gallbladder bed which I had   irrigated.  I used a lot of irrigation just as I did hydrodissection.   When completed, I put a 19 mm Blake in the right upper through the right-   sided and put it up in the gallbladder bed.  The wounds were closed with   staples because of the  class IV nature of this nasty necrotic   gallbladder.  The patient will be maintained on Zosyn. He was taken to   the recovery room in satisfactory condition.               Thornton Park Daphine Deutscher, MD            MBM/MEDQ  D:  01/11/2010  T:  01/12/2010  Job:  [id number]      cc:   Loraine Leriche A. Perini, M.D.   Fax: [phone]      Electronically Signed by Luretha Murphy MD on 01/17/2010 02:14:43 PM

## 2010-12-15 NOTE — Assessment & Plan Note (Signed)
Summary: Severe stomach pain...   History of Present Illness Visit Type: Follow-up Visit Primary GI MD: Yancey Flemings MD Primary Provider: Rodrigo Ran, MD Requesting Provider: Rodrigo Ran, MD Chief Complaint: severe abdominal pain History of Present Illness:   65 year old with hypertension, hyperlipidemia, diabetes mellitus, sleep apnea, and adenomatous colon polyps. He presents today with a chief complaint of bloating. He is accompanied by his sister. Patient reports a one-year history of problems with bloating. Also a tendency toward constipation. Symptoms are worse with constipation. His last complete colonoscopy was December 28, 2009. This was normal except for mild diverticulosis and internal hemorrhoids. Several weeks later he developed cholecystitis and is now status post cholecystectomy. He denies nausea, vomiting, or weight loss. Minor intermittent rectal bleeding. Occasional loose stools with urgency when eating roughage. Recently placed on Prilosec 2 weeks ago. He thinks this is helping some. Plain films of the abdomen obtained September 23, 2010 revealed mild constipation and renal nephrolithiasis. Laboratories obtained January 9 revealed normal CBC, TSH, and liver tests (except for mild elevation of alkaline phosphatase).Marland Kitchen   GI Review of Systems    Reports abdominal pain and  bloating.     Location of  Abdominal pain: generalized.    Denies acid reflux, belching, chest pain, dysphagia with liquids, dysphagia with solids, heartburn, loss of appetite, nausea, vomiting, vomiting blood, weight loss, and  weight gain.      Reports constipation, diarrhea, light color stool, and  rectal bleeding.     Denies anal fissure, black tarry stools, change in bowel habit, diverticulosis, fecal incontinence, heme positive stool, hemorrhoids, irritable bowel syndrome, jaundice, liver problems, and  rectal pain.    Current Medications (verified): 1)  Aspirin 81 Mg Tabs (Aspirin) .Marland Kitchen.. 1 Tablet By Mouth  Once Daily 2)  Phenytoin Sodium Extended 100 Mg Caps (Phenytoin Sodium Extended) .Marland Kitchen.. 1 Capsule By Mouth Three Times A Day 3)  Glyburide-Metformin 1.25-250 Mg Tabs (Glyburide-Metformin) .Marland Kitchen.. 1 Tablet By Mouth Two Times A Day 4)  Benazepril Hcl 40 Mg Tabs (Benazepril Hcl) .... One Tablet By Mouth Once Daily 5)  Lipitor 20 Mg Tabs (Atorvastatin Calcium) .... One Tablet By Mouth Once Daily At Bedtime 6)  Vitamin D 1000 Unit Tabs (Cholecalciferol) .... One Tablet By Mouth Once Daily 7)  Niacin 250 Mg Tabs (Niacin) .... One Tablet By Mouth Once Daily 8)  Fish Oil   Oil (Fish Oil) .... One Tablet By Mouth Once Daily 9)  Keppra 500 Mg Tabs (Levetiracetam) .Marland Kitchen.. 1 By Mouth Two Times A Day 10)  Flomax 0.4 Mg Caps (Tamsulosin Hcl) .Marland Kitchen.. 1 By Mouth Dialy 11)  Klor-Con M20 20 Meq Cr-Tabs (Potassium Chloride Crys Cr) .... Take 1 Tablet By Mouth Every Other Day  Allergies (verified): No Known Drug Allergies  Past History:  Past Medical History: Reviewed history from 06/01/2010 and no changes required. Colon Polyp-Tubular Adenoma Diabetes 2001 Hypertension 1999 Arthritis Hyperlipidemia 1999 Epilepsy Sleep apnea  Past Surgical History: Reviewed history from 06/01/2010 and no changes required. Left Knee Surgery  Cholecystectomy  Family History: Reviewed history from 11/04/2009 and no changes required. Family History of Breast Cancer:Older Sister  No FH of Colon Cancer: Family History of Diabetes: Older Sister   Social History: Reviewed history from 06/01/2010 and no changes required. Occupation: Air cabin crew [employer] Divorced No childern Alcohol Use - no Patient is a former smoker. (Quit cigars in the 1980s)  Review of Systems       The patient complains of arthritis/joint pain.  The patient denies  allergy/sinus, anemia, anxiety-new, back pain, blood in urine, breast changes/lumps, change in vision, confusion, cough, coughing up blood, depression-new, fainting, fatigue,  fever, headaches-new, hearing problems, heart murmur, heart rhythm changes, itching, menstrual pain, muscle pains/cramps, night sweats, nosebleeds, pregnancy symptoms, shortness of breath, skin rash, sleeping problems, sore throat, swelling of feet/legs, swollen lymph glands, thirst - excessive , urination - excessive , urination changes/pain, urine leakage, vision changes, and voice change.    Vital Signs:  Patient profile:   65 year old male Height:      70.5 inches Weight:      204.25 pounds BMI:     29.00 Pulse rate:   80 / minute Pulse rhythm:   regular BP sitting:   136 / 80  (left arm) Cuff size:   regular  Vitals Entered By: June McMurray CMA Duncan Dull) (December 05, 2010 10:21 AM)  Physical Exam  General:  Well developed, well nourished, no acute distress. Head:  Normocephalic and atraumatic. Eyes:  PERRLA, no icterus. Ears:  Normal auditory acuity. Nose:  No deformity, discharge,  or lesions. Mouth:  No deformity or lesions, dentition normal. Neck:  Supple; no masses or thyromegaly. Lungs:  Clear throughout to auscultation. Heart:  Regular rate and rhythm; no murmurs, rubs,  or bruits. Abdomen:  Soft, nontender and nondistended. No masses, hepatosplenomegaly or hernias noted. Normal bowel sounds. No succussion splash. Msk:  Symmetrical with no gross deformities. Normal posture. Pulses:  Normal pulses noted. Extremities:  No clubbing, cyanosis, edema or deformities noted. Neurologic:  Alert and  oriented x4;  grossly normal neurologically. Skin:  Intact without significant lesions or rashes. Psych:  Alert and cooperative. Normal mood and affect.   Impression & Recommendations:  Problem # 1:  FLATULENCE-GAS-BLOATING (ICD-787.3) chronic bloating associated with constipation. Rule out symptoms secondary to constipation, bacterial overgrowth, sprue. Some abdominal discomfort as well. Seemingly improved on PPI. See below.  Plan: #1. MiraLax daily to achieve one to 2 bowel  movements daily #2. Upper endoscopy with biopsies #3. Continue PPI #4. Consider trial probiotic  Problem # 2:  ABDOMINAL PAIN -GENERALIZED (ICD-789.07) see above. Plan is to continue PPI and upper endoscopy  Problem # 3:  CONSTIPATION (ICD-564.00) sabove. Recommend MiraLax  Problem # 4:  PERSONAL HX COLONIC POLYPS (ICD-V12.72) surveillance up-to-date. Due for repeat surveillance February 2016.  Other Orders: EGD (EGD)  Patient Instructions: 1)  EGD [facility] 12/07/10 9:00 am arrive at 8:00 am on 4 th floor 2)  Upper Endoscopy brochure given.  3)  Miralax daily take 1 TBSP in 8 oz glass of water  You can buy over the counter 4)  Copy sent to : Rodrigo Ran, MD 5)  The medication list was reviewed and reconciled.  All changed / newly prescribed medications were explained.  A complete medication list was provided to the patient / caregiver.

## 2010-12-15 NOTE — Consult Note (Signed)
Summary: Consultation Report-Hospital    NAME:  Arthur Berry, Arthur Berry NO.:  [id number]      MEDICAL RECORD NO.:  [id number]          PATIENT TYPE:  INP      LOCATION:  0106                         FACILITY:  [facility]      PHYSICIAN:  Sandria Bales. Ezzard Standing, M.D.  DATE OF BIRTH:  10-16-1946      DATE OF CONSULTATION: 01/10/2010                                    CONSULTATION      HISTORY:  This is a 65 year old white male, who is a patient of Dr. Loraine Leriche   Perini's, who has had about a 3-day history of abdominal and back pain.   He has not had any nausea, vomiting or diarrhea.  He has had no prior   abdominal surgery, no history of peptic ulcer disease or liver disease.   He had a colonoscopy a few years ago by Dr. Yancey Flemings, which was   reported as negative.  He was seen Dr. Waynard Edwards today, who obtained a CT   scan tonight, read by Dr. 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      I was called by Dr. Eric Form who is on-call tonight for Dr. Waynard Edwards.      ALLERGIES:  HE HAS NO ALLERGIES.      CURRENT MEDICATIONS:   1. Glucophage,   2. Metformin.   3. Dilantin 100 mg three times a day.   4. Niacin.   5. Lotensin 20 mg daily for blood pressure.   6. Flomax 0.4 mg daily.   7. Lexapro 10 mg 1/2 tablet daily.   8. Lipitor 20 mg daily.   9. Aspirin.      REVIEW OF SYSTEMS:     NEUROLOGIC:  He was diagnosed with epilepsy I guess   in his youth some 30-40 years ago.  The exact etiology of his epilepsy   is unclear.  His last seizure was over 20 years ago.  He sees Dr.   Marjory Lies with [facility].  He also actually has had a recent   headache that has been bothering him and they are going to see him in   the next 3-4 weeks for this evaluation anyway.     PULMONARY:  He denies   cigarettes.  No history of pneumonia or tuberculosis.     CARDIAC:  He had   been hypertensive for about 6 months.  He had no chest pain, no cardiac   evaluation.     GASTROINTESTINAL:  See history of present illness.   UROLOGIC:  He had known kidney stones for a long time, he says since his   teenage years.  He sees Dr. Isabel Caprice, but has not seen him in 4-5 years.   At first, it was thought that he may be having kidney stones by some of   his symptoms, but these appear apparently stable and nonobstructing.      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  His sister,   Nita Sickle is at the bedside.      PHYSICAL EXAMINATION:  VITAL SIGNS:  His weight is 191 pounds, his pulse   is 88.   GENERAL:  He is a well-nourished older white male, alert and cooperative   on physical exam.   HEENT:  Unremarkable.   NECK:  Supple.  I felt no mass or thyromegaly.   LUNGS:  Clear to auscultation.   HEART:  Regular rate and rhythm without murmur or rub.   ABDOMEN:  I think he has a palpable gallbladder, surprisingly not much   tenderness on physical exam.  No guarding, no rebound.  His bowel   present are present, but decreased.   EXTREMITIES:  Had good strength in upper and lower extremities.   NEUROLOGIC:  Grossly intact.      LABORATORY DATA:  The labs that I have are from Dr. Laurey Morale office.   This includes a hemoglobin of 17, hematocrit 52, white blood count   23,600.  His total bilirubin is 1.5, alk phos is 102.  His glucose is   169, BUN 29, creatinine 1.4, sodium 138, potassium 3.2, chloride of 94.      I again reviewed his CT scan over the phone with Dr. Maryclare Bean.  This   shows significant inflammation of his gallbladder with possible   involvement of the duodenum in some form.      IMPRESSION:   1. Acute cholecystitis with cholelithiasis.  I discussed these       findings with the patient and his sister.  I think he will be best       served with surgery.     I will plan for surgery tomorrow.  I told him       that I am on-call  and  I may not  be the one doing the surgery       tomorrow.  I also talked about the potential risks of gallbladder       surgery, which include bleeding, bowel injury, common duct injury       and the possibility of open surgery.  I think they understand both       what is going on and the need for surgery for this.   2. Hematuria.   3. Kidney stones, which he has had for some time, but has been       followed remotely by Dr. Isabel Caprice.   4. History of epilepsy.  He is seeing Dr. Marjory Lies of [facility]       Neurologic.   5. Recent headaches, etiology unclear.   6. Hypertension.   7. Non-insulin dependent diabetes mellitus.  His blood sugars today       were 169.   8. Hyperlipidemia.   9. Hypokalemia.               Sandria Bales. Ezzard Standing, M.D.            DHN/MEDQ  D:  01/10/2010  T:  01/11/2010  Job:  [id number]      cc:   Loraine Leriche A. Perini, M.D.   Fax: [phone]      Valetta Fuller, M.D.   Fax: [phone]      Joycelyn Schmid, MD   Fax: [phone]      Electronically Signed by Ovidio Kin M.D. on 01/12/2010 08:06:55 AM

## 2010-12-15 NOTE — Letter (Signed)
Summary: EGD Instructions  [facility]  [address] [place], [place] [address]   Phone: [phone]  Fax: [phone]       Arthur Berry    03-17-1946    MRN: [id number]       Procedure Day /Date:WEDNESDAY, 12/07/10     Arrival Time: 8:00 AM     Procedure Time:9:00 AM     Location of Procedure:                    [facility] (4th Floor)   PREPARATION FOR ENDOSCOPY   On WEDNESDAY, 12/07/10 THE DAY OF THE PROCEDURE:  1.   No solid foods, milk or milk products are allowed after midnight the night before your procedure.  2.   Do not drink anything colored red or purple.  Avoid juices with pulp.  No orange juice.  3.  You may drink clear liquids until7:00 AM, which is 2 hours before your procedure.                                                                                                CLEAR LIQUIDS INCLUDE: Water Jello Ice Popsicles Tea (sugar ok, no milk/cream) Powdered fruit flavored drinks Coffee (sugar ok, no milk/cream) Gatorade Juice: [place], white grape, white cranberry  Lemonade Clear bullion, consomm, broth Carbonated beverages (any kind) Strained chicken noodle soup Hard Candy   MEDICATION INSTRUCTIONS  Unless otherwise instructed, you should take regular prescription medications with a small sip of water as early as possible the morning of your procedure.  Diabetic patients - see separate instructions.  Stop taking Plavix or Aggrenox on  _  (7 days before procedure).     Stop taking Coumadin on  _  (5 days before procedure).  Additional medication instructions: _             OTHER INSTRUCTIONS  You will need a responsible adult at least 65 years of age to accompany you and drive you home.   This person must remain in the waiting room during your procedure.  Wear loose fitting clothing that is easily removed.  Leave jewelry and other valuables at home.  However, you may wish to bring a book to read or an  iPod/MP3 player to listen to music as you wait for your procedure to start.  Remove all body piercing jewelry and leave at home.  Total time from sign-in until discharge is approximately 2-3 hours.  You should go home directly after your procedure and rest.  You can resume normal activities the day after your procedure.  The day of your procedure you should not:   Drive   Make legal decisions   Operate machinery   Drink alcohol   Return to work  You will receive specific instructions about eating, activities and medications before you leave.    The above instructions have been reviewed and explained to me by   _______________________    I fully understand and can verbalize these instructions _____________________________ Date _________

## 2010-12-21 NOTE — Letter (Signed)
Summary: Patient Notice-Endo Biopsy Results  [facility]  [address] [place], [place] [address]   Phone: [phone]  Fax: [phone]        December 12, 2010 MRN: [id number]    [facility] [address] [place], [place]  [address]    Dear Arthur Berry,  I am pleased to inform you that the biopsies taken during your recent endoscopic examination did not show any abnormalities. They were normal.   Additional information/recommendations:  __ Continue with the treatment plan as outlined on the day of your      exam.    Please call us if you are having persistent problems or have questions about your condition that have not been fully answered at this time.  Sincerely,  Hilarie Fredrickson MD  This letter has been electronically signed by your physician.  Appended Document: Patient Notice-Endo Biopsy Results LETTER MAILED

## 2011-01-29 LAB — CBC
MCV: 94.5 fL (ref 78.0–100.0)
Platelets: 142 10*3/uL — ABNORMAL LOW (ref 150–400)
RBC: 4.55 MIL/uL (ref 4.22–5.81)
RDW: 13.9 % (ref 11.5–15.5)
WBC: 7.8 10*3/uL (ref 4.0–10.5)

## 2011-01-29 LAB — BASIC METABOLIC PANEL
BUN: 15 mg/dL (ref 6–23)
Calcium: 9.9 mg/dL (ref 8.4–10.5)
Chloride: 101 mEq/L (ref 96–112)
Creatinine, Ser: 0.9 mg/dL (ref 0.4–1.5)
GFR calc Af Amer: >60 mL/min (ref >60)
GFR calc non Af Amer: >60 mL/min (ref >60)

## 2011-01-29 LAB — DIFFERENTIAL
Basophils Absolute: 0 10*3/uL (ref 0.0–0.1)
Eosinophils Relative: 7 % — ABNORMAL HIGH (ref 0–5)
Lymphocytes Relative: 26 % (ref 12–46)
Lymphs Abs: 2 10*3/uL (ref 0.7–4.0)
Neutro Abs: 4.4 10*3/uL (ref 1.7–7.7)
Neutrophils Relative %: 57 % (ref 43–77)

## 2011-01-29 LAB — GLUCOSE, CAPILLARY: Glucose-Capillary: 104 mg/dL — ABNORMAL HIGH (ref 70–99)

## 2011-01-29 LAB — POCT CARDIAC MARKERS
CKMB, poc: 1.3 ng/mL (ref 1.0–8.0)
Troponin i, poc: <0.05 ng/mL (ref 0.00–0.09)

## 2011-02-01 LAB — URINALYSIS, ROUTINE W REFLEX MICROSCOPIC
Glucose, UA: NEGATIVE mg/dL
Nitrite: NEGATIVE
Specific Gravity, Urine: 1.017 (ref 1.005–1.030)
pH: 5.5 (ref 5.0–8.0)

## 2011-02-01 LAB — URINE MICROSCOPIC-ADD ON

## 2011-02-01 LAB — GLUCOSE, CAPILLARY
Glucose-Capillary: 146 mg/dL — ABNORMAL HIGH (ref 70–99)
Glucose-Capillary: 179 mg/dL — ABNORMAL HIGH (ref 70–99)

## 2011-02-05 LAB — COMPREHENSIVE METABOLIC PANEL
ALT: 21 U/L (ref 0–53)
ALT: 22 U/L (ref 0–53)
ALT: 24 U/L (ref 0–53)
AST: 21 U/L (ref 0–37)
AST: 23 U/L (ref 0–37)
Albumin: 2.4 g/dL — ABNORMAL LOW (ref 3.5–5.2)
Albumin: 3.2 g/dL — ABNORMAL LOW (ref 3.5–5.2)
Alkaline Phosphatase: 72 U/L (ref 39–117)
Alkaline Phosphatase: 76 U/L (ref 39–117)
Alkaline Phosphatase: 91 U/L (ref 39–117)
BUN: 10 mg/dL (ref 6–23)
BUN: 16 mg/dL (ref 6–23)
CO2: 30 mEq/L (ref 19–32)
CO2: 30 mEq/L (ref 19–32)
CO2: 32 mEq/L (ref 19–32)
Calcium: 8.2 mg/dL — ABNORMAL LOW (ref 8.4–10.5)
Chloride: 101 mEq/L (ref 96–112)
Chloride: 106 mEq/L (ref 96–112)
Chloride: 96 mEq/L (ref 96–112)
Creatinine, Ser: 0.9 mg/dL (ref 0.4–1.5)
GFR calc Af Amer: >60 mL/min (ref >60)
GFR calc Af Amer: >60 mL/min (ref >60)
GFR calc non Af Amer: >60 mL/min (ref >60)
GFR calc non Af Amer: >60 mL/min (ref >60)
GFR calc non Af Amer: >60 mL/min (ref >60)
Glucose, Bld: 113 mg/dL — ABNORMAL HIGH (ref 70–99)
Glucose, Bld: 147 mg/dL — ABNORMAL HIGH (ref 70–99)
Potassium: 3 mEq/L — ABNORMAL LOW (ref 3.5–5.1)
Potassium: 3.2 mEq/L — ABNORMAL LOW (ref 3.5–5.1)
Potassium: 3.3 mEq/L — ABNORMAL LOW (ref 3.5–5.1)
Sodium: 139 mEq/L (ref 135–145)
Total Bilirubin: 0.7 mg/dL (ref 0.3–1.2)
Total Bilirubin: 1 mg/dL (ref 0.3–1.2)
Total Bilirubin: 1.2 mg/dL (ref 0.3–1.2)
Total Bilirubin: 1.5 mg/dL — ABNORMAL HIGH (ref 0.3–1.2)
Total Protein: 5.7 g/dL — ABNORMAL LOW (ref 6.0–8.3)

## 2011-02-05 LAB — DIFFERENTIAL
Basophils Absolute: 0 10*3/uL (ref 0.0–0.1)
Basophils Absolute: 0.1 10*3/uL (ref 0.0–0.1)
Basophils Absolute: 0.1 10*3/uL (ref 0.0–0.1)
Basophils Relative: 0 % (ref 0–1)
Basophils Relative: 0 % (ref 0–1)
Basophils Relative: 1 % (ref 0–1)
Eosinophils Absolute: 0.2 10*3/uL (ref 0.0–0.7)
Eosinophils Absolute: 0.3 10*3/uL (ref 0.0–0.7)
Eosinophils Absolute: 0.7 10*3/uL (ref 0.0–0.7)
Eosinophils Relative: 1 % (ref 0–5)
Lymphocytes Relative: 19 % (ref 12–46)
Lymphs Abs: 1.5 10*3/uL (ref 0.7–4.0)
Monocytes Absolute: 1.5 10*3/uL — ABNORMAL HIGH (ref 0.1–1.0)
Monocytes Relative: 13 % — ABNORMAL HIGH (ref 3–12)
Neutro Abs: 4.9 10*3/uL (ref 1.7–7.7)
Neutro Abs: 5.9 10*3/uL (ref 1.7–7.7)
Neutrophils Relative %: 64 % (ref 43–77)
Neutrophils Relative %: 75 % (ref 43–77)

## 2011-02-05 LAB — GLUCOSE, CAPILLARY
Glucose-Capillary: 108 mg/dL — ABNORMAL HIGH (ref 70–99)
Glucose-Capillary: 113 mg/dL — ABNORMAL HIGH (ref 70–99)
Glucose-Capillary: 123 mg/dL — ABNORMAL HIGH (ref 70–99)
Glucose-Capillary: 132 mg/dL — ABNORMAL HIGH (ref 70–99)
Glucose-Capillary: 134 mg/dL — ABNORMAL HIGH (ref 70–99)
Glucose-Capillary: 149 mg/dL — ABNORMAL HIGH (ref 70–99)
Glucose-Capillary: 156 mg/dL — ABNORMAL HIGH (ref 70–99)
Glucose-Capillary: 158 mg/dL — ABNORMAL HIGH (ref 70–99)
Glucose-Capillary: 163 mg/dL — ABNORMAL HIGH (ref 70–99)
Glucose-Capillary: 166 mg/dL — ABNORMAL HIGH (ref 70–99)
Glucose-Capillary: 178 mg/dL — ABNORMAL HIGH (ref 70–99)
Glucose-Capillary: 217 mg/dL — ABNORMAL HIGH (ref 70–99)

## 2011-02-05 LAB — CBC
HCT: 33.1 % — ABNORMAL LOW (ref 39.0–52.0)
HCT: 34.1 % — ABNORMAL LOW (ref 39.0–52.0)
Hemoglobin: 11.6 g/dL — ABNORMAL LOW (ref 13.0–17.0)
Hemoglobin: 13.1 g/dL (ref 13.0–17.0)
MCHC: 34 g/dL (ref 30.0–36.0)
MCHC: 34.6 g/dL (ref 30.0–36.0)
MCV: 94.9 fL (ref 78.0–100.0)
Platelets: 160 10*3/uL (ref 150–400)
Platelets: 175 10*3/uL (ref 150–400)
RBC: 3.6 MIL/uL — ABNORMAL LOW (ref 4.22–5.81)
RBC: 4.03 MIL/uL — ABNORMAL LOW (ref 4.22–5.81)
RBC: 4.67 MIL/uL (ref 4.22–5.81)
RDW: 13.5 % (ref 11.5–15.5)
WBC: 12.3 10*3/uL — ABNORMAL HIGH (ref 4.0–10.5)
WBC: 19.1 10*3/uL — ABNORMAL HIGH (ref 4.0–10.5)
WBC: 8.1 10*3/uL (ref 4.0–10.5)

## 2011-02-05 LAB — HEMOGLOBIN A1C
Hgb A1c MFr Bld: 7 % — ABNORMAL HIGH (ref 4.6–6.1)
Mean Plasma Glucose: 154 mg/dL

## 2011-02-05 LAB — AMYLASE: Amylase: 26 U/L (ref 0–105)

## 2011-02-17 LAB — CBC
HCT: 36.4 % — ABNORMAL LOW (ref 39.0–52.0)
Hemoglobin: 12.4 g/dL — ABNORMAL LOW (ref 13.0–17.0)
MCHC: 34.1 g/dL (ref 30.0–36.0)
MCV: 94.5 fL (ref 78.0–100.0)
RDW: 13.3 % (ref 11.5–15.5)

## 2011-02-17 LAB — DIFFERENTIAL
Basophils Absolute: 0 10*3/uL (ref 0.0–0.1)
Basophils Relative: 1 % (ref 0–1)
Eosinophils Absolute: 0.3 10*3/uL (ref 0.0–0.7)
Eosinophils Relative: 5 % (ref 0–5)
Lymphocytes Relative: 24 % (ref 12–46)
Monocytes Absolute: 0.5 10*3/uL (ref 0.1–1.0)

## 2011-02-17 LAB — BASIC METABOLIC PANEL
BUN: 14 mg/dL (ref 6–23)
CO2: 30 mEq/L (ref 19–32)
Chloride: 103 mEq/L (ref 96–112)
Glucose, Bld: 146 mg/dL — ABNORMAL HIGH (ref 70–99)
Potassium: 3.3 mEq/L — ABNORMAL LOW (ref 3.5–5.1)
Sodium: 141 mEq/L (ref 135–145)

## 2011-02-18 LAB — BASIC METABOLIC PANEL
BUN: 12 mg/dL (ref 6–23)
Creatinine, Ser: 0.79 mg/dL (ref 0.4–1.5)
GFR calc non Af Amer: >60 mL/min (ref >60)
Glucose, Bld: 162 mg/dL — ABNORMAL HIGH (ref 70–99)
Potassium: 2.8 mEq/L — ABNORMAL LOW (ref 3.5–5.1)

## 2011-02-18 LAB — DIFFERENTIAL
Basophils Absolute: 0 10*3/uL (ref 0.0–0.1)
Eosinophils Absolute: 0.2 10*3/uL (ref 0.0–0.7)
Eosinophils Relative: 3 % (ref 0–5)
Lymphocytes Relative: 19 % (ref 12–46)

## 2011-02-18 LAB — CBC
HCT: 38 % — ABNORMAL LOW (ref 39.0–52.0)
Platelets: 137 10*3/uL — ABNORMAL LOW (ref 150–400)
RDW: 14 % (ref 11.5–15.5)

## 2011-02-18 LAB — URINALYSIS, ROUTINE W REFLEX MICROSCOPIC
Ketones, ur: NEGATIVE mg/dL
Leukocytes, UA: NEGATIVE
Nitrite: NEGATIVE
Protein, ur: 30 mg/dL — AB
pH: 6.5 (ref 5.0–8.0)

## 2011-03-10 ENCOUNTER — Other Ambulatory Visit (HOSPITAL_COMMUNITY): Payer: Self-pay | Admitting: Internal Medicine

## 2011-03-10 DIAGNOSIS — K5790 Diverticulosis of intestine, part unspecified, without perforation or abscess without bleeding: Secondary | ICD-10-CM

## 2011-03-13 ENCOUNTER — Ambulatory Visit (HOSPITAL_COMMUNITY)
Admission: RE | Admit: 2011-03-13 | Discharge: 2011-03-13 | Disposition: A | Discharge status: Home / Self Care | Payer: Commercial Managed Care - PPO | Source: Ambulatory Visit | Attending: Internal Medicine | Admitting: Internal Medicine

## 2011-03-13 ENCOUNTER — Other Ambulatory Visit (HOSPITAL_COMMUNITY): Payer: Self-pay | Admitting: Internal Medicine

## 2011-03-13 DIAGNOSIS — K5792 Diverticulitis of intestine, part unspecified, without perforation or abscess without bleeding: Secondary | ICD-10-CM

## 2011-03-13 DIAGNOSIS — K5790 Diverticulosis of intestine, part unspecified, without perforation or abscess without bleeding: Secondary | ICD-10-CM

## 2011-03-14 ENCOUNTER — Ambulatory Visit (HOSPITAL_COMMUNITY)
Admission: RE | Admit: 2011-03-14 | Discharge: 2011-03-14 | Disposition: A | Discharge status: Home / Self Care | Payer: Commercial Managed Care - PPO | Source: Ambulatory Visit | Attending: Internal Medicine | Admitting: Internal Medicine

## 2011-03-14 DIAGNOSIS — K5792 Diverticulitis of intestine, part unspecified, without perforation or abscess without bleeding: Secondary | ICD-10-CM

## 2011-03-14 DIAGNOSIS — K4090 Unilateral inguinal hernia, without obstruction or gangrene, not specified as recurrent: Secondary | ICD-10-CM | POA: Insufficient documentation

## 2011-03-14 DIAGNOSIS — N200 Calculus of kidney: Secondary | ICD-10-CM | POA: Insufficient documentation

## 2011-03-14 DIAGNOSIS — K5730 Diverticulosis of large intestine without perforation or abscess without bleeding: Secondary | ICD-10-CM | POA: Insufficient documentation

## 2011-03-14 DIAGNOSIS — N1330 Unspecified hydronephrosis: Secondary | ICD-10-CM | POA: Insufficient documentation

## 2011-03-28 NOTE — Op Note (Signed)
NAME:  Arthur Berry, Arthur Berry                ACCOUNT NO.:  [id number]   MEDICAL RECORD NO.:  [id number]          PATIENT TYPE:  AMB   LOCATION:  DSC                          FACILITY:  [facility]   PHYSICIAN:  Mila Homer. Sherlean Foot, M.D. DATE OF BIRTH:  21-May-1946   DATE OF PROCEDURE:  07/31/2007  DATE OF DISCHARGE:                               OPERATIVE REPORT   SURGEON:  Mila Homer. Sherlean Foot, M.D.   ASSISTANT:  None.   ANESTHESIA:  General.   PREOPERATIVE DIAGNOSIS:  Left knee lateral meniscus tear.   POSTOPERATIVE DIAGNOSIS:  Left knee lateral meniscus tear.   PROCEDURE:  Left knee arthroscopy, partial medial meniscectomy,  chondroplasty in the medial compartment.   INDICATIONS FOR PROCEDURE:  The patient is 65 year old white male with  failure of conservative measures and mechanical symptoms with MRI  evidence of meniscus tear.  Informed consent was contained.   DESCRIPTION OF PROCEDURE:  The patient was laid supine, administered MAC  anesthesia.  The left leg was prepped and draped in usual sterile  fashion.  Inferolateral and inferomedial portal created with a #11  blade, blunt trocar and cannula.  Diagnostic arthroscopy revealed  chondromalacia of the medial compartment, grade 2 and grade 3 this was  debrided.  The trochlea and patella were normal.  Lateral compartment  was completely normal.  ACL, PCL were normal when probed.  The posterior  horn of medial meniscus had a complex tear.  Straight and upbiting  basket forceps were used as well as a 3.5 [place] shaver to perform  partial medial meniscectomy.  Chondroplasty was completed on the medial  femoral condyle.  Joint was then irrigated and closed with 4-0 nylon  sutures in each portal and the knee dressed with Xeroform dressing  sponges, sterile Webril and Ace wrap.   COMPLICATIONS:  None.   DRAINS:  None.           ______________________________  Mila Homer. Sherlean Foot, M.D.     SDL/MEDQ  D:  07/31/2007  T:  07/31/2007  Job:   [id number]

## 2011-04-18 ENCOUNTER — Other Ambulatory Visit: Payer: Self-pay | Admitting: Urology

## 2011-04-18 DIAGNOSIS — N200 Calculus of kidney: Secondary | ICD-10-CM

## 2011-05-03 ENCOUNTER — Encounter (HOSPITAL_COMMUNITY): Payer: 59

## 2011-05-03 ENCOUNTER — Other Ambulatory Visit: Payer: Self-pay | Admitting: Anesthesiology

## 2011-05-03 ENCOUNTER — Other Ambulatory Visit: Payer: Self-pay | Admitting: Urology

## 2011-05-03 LAB — BASIC METABOLIC PANEL
BUN: 20 mg/dL (ref 6–23)
CO2: 31 mEq/L (ref 19–32)
Chloride: 101 mEq/L (ref 96–112)
Creatinine, Ser: 0.7 mg/dL (ref 0.50–1.35)

## 2011-05-03 LAB — CBC
HCT: 38.8 % — ABNORMAL LOW (ref 39.0–52.0)
Hemoglobin: 13.6 g/dL (ref 13.0–17.0)
MCV: 87.8 fL (ref 78.0–100.0)
RBC: 4.42 MIL/uL (ref 4.22–5.81)
WBC: 7.3 10*3/uL (ref 4.0–10.5)

## 2011-05-03 LAB — SURGICAL PCR SCREEN: MRSA, PCR: NEGATIVE

## 2011-05-08 ENCOUNTER — Ambulatory Visit (HOSPITAL_COMMUNITY): Payer: 59

## 2011-05-08 ENCOUNTER — Ambulatory Visit (HOSPITAL_COMMUNITY)
Admission: RE | Admit: 2011-05-08 | Discharge: 2011-05-08 | Disposition: A | Discharge status: Home / Self Care | Payer: 59 | Source: Ambulatory Visit | Attending: Urology | Admitting: Urology

## 2011-05-08 ENCOUNTER — Ambulatory Visit (HOSPITAL_COMMUNITY)
Admission: RE | Admit: 2011-05-08 | Discharge: 2011-05-09 | Disposition: A | Discharge status: Home / Self Care | Payer: 59 | Source: Ambulatory Visit | Attending: Urology | Admitting: Urology

## 2011-05-08 DIAGNOSIS — I10 Essential (primary) hypertension: Secondary | ICD-10-CM | POA: Insufficient documentation

## 2011-05-08 DIAGNOSIS — Z79899 Other long term (current) drug therapy: Secondary | ICD-10-CM | POA: Insufficient documentation

## 2011-05-08 DIAGNOSIS — R109 Unspecified abdominal pain: Secondary | ICD-10-CM | POA: Insufficient documentation

## 2011-05-08 DIAGNOSIS — Z7982 Long term (current) use of aspirin: Secondary | ICD-10-CM | POA: Insufficient documentation

## 2011-05-08 DIAGNOSIS — N1330 Unspecified hydronephrosis: Secondary | ICD-10-CM | POA: Insufficient documentation

## 2011-05-08 DIAGNOSIS — Z01812 Encounter for preprocedural laboratory examination: Secondary | ICD-10-CM | POA: Insufficient documentation

## 2011-05-08 DIAGNOSIS — N200 Calculus of kidney: Secondary | ICD-10-CM | POA: Insufficient documentation

## 2011-05-08 DIAGNOSIS — E119 Type 2 diabetes mellitus without complications: Secondary | ICD-10-CM | POA: Insufficient documentation

## 2011-05-08 DIAGNOSIS — G4733 Obstructive sleep apnea (adult) (pediatric): Secondary | ICD-10-CM | POA: Insufficient documentation

## 2011-05-08 DIAGNOSIS — Z01818 Encounter for other preprocedural examination: Secondary | ICD-10-CM | POA: Insufficient documentation

## 2011-05-08 LAB — GLUCOSE, CAPILLARY
Glucose-Capillary: 160 mg/dL — ABNORMAL HIGH (ref 70–99)
Glucose-Capillary: 165 mg/dL — ABNORMAL HIGH (ref 70–99)
Glucose-Capillary: 176 mg/dL — ABNORMAL HIGH (ref 70–99)

## 2011-05-08 MED ORDER — IOHEXOL 300 MG/ML  SOLN
50.0000 mL | Freq: Once | INTRAMUSCULAR | Status: AC | PRN
  Administered 2011-05-08: 10 mL

## 2011-05-09 ENCOUNTER — Ambulatory Visit (HOSPITAL_COMMUNITY): Payer: 59

## 2011-05-09 LAB — CBC
Hemoglobin: 12.1 g/dL — ABNORMAL LOW (ref 13.0–17.0)
MCH: 30.8 pg (ref 26.0–34.0)
MCHC: 35.2 g/dL (ref 30.0–36.0)
Platelets: 148 10*3/uL — ABNORMAL LOW (ref 150–400)
RDW: 13.3 % (ref 11.5–15.5)

## 2011-05-09 LAB — BASIC METABOLIC PANEL
Calcium: 8.6 mg/dL (ref 8.4–10.5)
GFR calc Af Amer: >60 mL/min (ref >60)
GFR calc non Af Amer: >60 mL/min (ref >60)
Glucose, Bld: 230 mg/dL — ABNORMAL HIGH (ref 70–99)
Sodium: 138 mEq/L (ref 135–145)

## 2011-05-09 LAB — GLUCOSE, CAPILLARY: Glucose-Capillary: 218 mg/dL — ABNORMAL HIGH (ref 70–99)

## 2011-05-19 ENCOUNTER — Encounter: Payer: Self-pay | Admitting: Cardiology

## 2011-05-25 NOTE — Op Note (Signed)
  NAMEJERRELL, MANGEL NO.:  [id number]  MEDICAL RECORD NO.:  [id number]  LOCATION:  1438                         FACILITY:  [facility]  PHYSICIAN:  Valetta Fuller, M.D.  DATE OF BIRTH:  02-21-1946  DATE OF PROCEDURE:  05/08/2011 DATE OF DISCHARGE:  05/09/2011                              OPERATIVE REPORT   PREOPERATIVE DIAGNOSIS:  Multiple large right renal calculi.  POSTOPERATIVE DIAGNOSIS:  Multiple large right renal calculi.  PROCEDURE PERFORMED:  Right percutaneous nephrolithotomy.  SURGEON:  Valetta Fuller, MD  ASSISTANT:  Sharen Counter, MD, from Interventional Radiology.  ANESTHESIA:  General endotracheal.  INDICATIONS:  Mr. Turnage is 65 years of age.  The patient was recently seen in our office and had developed some intermittent right flank discomfort.  A CT was performed which showed mild right hydronephrosis and substantial increase stone burden in his right collecting system. The patient had a 16-18 mm stone in his right renal pelvis along with an additional 10-14 mm stone and a 3rd smaller stone.  Because of these issues, we discussed treatment options with him.  We felt that given the stone burden that a percutaneous procedure would be certainly most likely to render him stone-free.  We discussed this approach versus other options with him, advantages and disadvantages and potential complications.  The patient did receive perioperative medical clearance. The patient now presents for definitive surgical intervention.  The patient had placement of right nephrostomy tube by Interventional Radiology prior to the surgical procedure earlier this morning.  He has received perioperative ciprofloxacin and PAS compression boots were placed.  TECHNIQUE AND FINDINGS:  The patient was brought to the operating room where he had successful induction of general endotracheal anesthesia. He was then carefully placed in the prone position and then prepped  and draped in the usual manner.  Interventional Radiology and real time fluoroscopy was utilized.  Interventional Radiology initiated the procedure by placing a 2nd safety wire down to the bladder and then performing fascial dilation with insertion of the access sheath into the renal pelvis.  There, large stones were encountered.  The ultrasonic lithotrite was utilized to fracture the stone into multiple pieces which were then extracted.  Fluoroscopically, there did not appear to be any residual fragments.  At the completion of the procedure, we utilized one of the wires to place an access catheter down into the bladder and a Foley catheter was used for nephrostomy tube.  Of note, a Foley catheter had also been placed after induction of general anesthesia.  Urine was light pink in color.  Contrast was injected and the collecting system appeared to be intact.  A nephrostomy tube was secured to the skin.  Estimated blood loss was approximately 300 cc. The patient had no obvious complications and was brought to recovery room in stable condition.    Valetta Fuller, M.D.     DSG/MEDQ  D:  05/09/2011  T:  05/10/2011  Job:  [id number]  Electronically Signed by Barron Alvine M.D. on 05/25/2011 05:44:32 PM

## 2011-06-20 ENCOUNTER — Encounter (HOSPITAL_BASED_OUTPATIENT_CLINIC_OR_DEPARTMENT_OTHER)
Admission: RE | Admit: 2011-06-20 | Discharge: 2011-06-20 | Disposition: A | Discharge status: Home / Self Care | Payer: 59 | Source: Ambulatory Visit | Attending: Surgery | Admitting: Surgery

## 2011-06-20 LAB — BASIC METABOLIC PANEL
BUN: 16 mg/dL (ref 6–23)
Calcium: 9.3 mg/dL (ref 8.4–10.5)
Chloride: 104 mEq/L (ref 96–112)
Creatinine, Ser: 0.77 mg/dL (ref 0.50–1.35)
GFR calc Af Amer: >60 mL/min (ref >60)

## 2011-06-23 ENCOUNTER — Ambulatory Visit (HOSPITAL_BASED_OUTPATIENT_CLINIC_OR_DEPARTMENT_OTHER)
Admission: RE | Admit: 2011-06-23 | Discharge: 2011-06-23 | Disposition: A | Discharge status: Home / Self Care | Payer: 59 | Source: Ambulatory Visit | Attending: Surgery | Admitting: Surgery

## 2011-06-23 DIAGNOSIS — I10 Essential (primary) hypertension: Secondary | ICD-10-CM | POA: Insufficient documentation

## 2011-06-23 DIAGNOSIS — K4030 Unilateral inguinal hernia, with obstruction, without gangrene, not specified as recurrent: Secondary | ICD-10-CM | POA: Insufficient documentation

## 2011-06-23 DIAGNOSIS — Z01812 Encounter for preprocedural laboratory examination: Secondary | ICD-10-CM | POA: Insufficient documentation

## 2011-06-23 DIAGNOSIS — Z01810 Encounter for preprocedural cardiovascular examination: Secondary | ICD-10-CM | POA: Insufficient documentation

## 2011-06-23 DIAGNOSIS — K4090 Unilateral inguinal hernia, without obstruction or gangrene, not specified as recurrent: Secondary | ICD-10-CM

## 2011-06-23 DIAGNOSIS — G4733 Obstructive sleep apnea (adult) (pediatric): Secondary | ICD-10-CM | POA: Insufficient documentation

## 2011-06-27 HISTORY — PX: INGUINAL HERNIA REPAIR: SUR1180

## 2011-07-03 ENCOUNTER — Emergency Department (HOSPITAL_COMMUNITY)
Admission: EM | Admit: 2011-07-03 | Discharge: 2011-07-03 | Disposition: A | Discharge status: Home / Self Care | Payer: 59 | Attending: Emergency Medicine | Admitting: Emergency Medicine

## 2011-07-03 DIAGNOSIS — E119 Type 2 diabetes mellitus without complications: Secondary | ICD-10-CM | POA: Insufficient documentation

## 2011-07-03 DIAGNOSIS — E7800 Pure hypercholesterolemia, unspecified: Secondary | ICD-10-CM | POA: Insufficient documentation

## 2011-07-03 DIAGNOSIS — G40909 Epilepsy, unspecified, not intractable, without status epilepticus: Secondary | ICD-10-CM | POA: Insufficient documentation

## 2011-07-03 DIAGNOSIS — K4091 Unilateral inguinal hernia, without obstruction or gangrene, recurrent: Secondary | ICD-10-CM | POA: Insufficient documentation

## 2011-07-03 DIAGNOSIS — Z79899 Other long term (current) drug therapy: Secondary | ICD-10-CM | POA: Insufficient documentation

## 2011-07-12 ENCOUNTER — Telehealth (INDEPENDENT_AMBULATORY_CARE_PROVIDER_SITE_OTHER): Payer: Self-pay

## 2011-07-12 NOTE — Telephone Encounter (Signed)
Patient called and said he was having pain in abdomen and he has went to ER on the 10th for stitch popping lose. Patient said he hasn't taken any pain medication so I told him to try OTC medication every 4 to 6 hrs to see if it helped with the pain. I told patient to call back if pain did not go away or got worse.

## 2011-07-17 ENCOUNTER — Emergency Department (HOSPITAL_COMMUNITY)
Admission: EM | Admit: 2011-07-17 | Discharge: 2011-07-17 | Disposition: A | Discharge status: Home / Self Care | Payer: 59 | Attending: Emergency Medicine | Admitting: Emergency Medicine

## 2011-07-17 ENCOUNTER — Emergency Department (HOSPITAL_COMMUNITY): Payer: 59

## 2011-07-17 DIAGNOSIS — R000 Tachycardia, unspecified: Secondary | ICD-10-CM | POA: Insufficient documentation

## 2011-07-17 DIAGNOSIS — G40909 Epilepsy, unspecified, not intractable, without status epilepticus: Secondary | ICD-10-CM | POA: Insufficient documentation

## 2011-07-17 DIAGNOSIS — E119 Type 2 diabetes mellitus without complications: Secondary | ICD-10-CM | POA: Insufficient documentation

## 2011-07-17 DIAGNOSIS — Z79899 Other long term (current) drug therapy: Secondary | ICD-10-CM | POA: Insufficient documentation

## 2011-07-17 DIAGNOSIS — R05 Cough: Secondary | ICD-10-CM | POA: Insufficient documentation

## 2011-07-17 DIAGNOSIS — R21 Rash and other nonspecific skin eruption: Secondary | ICD-10-CM | POA: Insufficient documentation

## 2011-07-17 DIAGNOSIS — R059 Cough, unspecified: Secondary | ICD-10-CM | POA: Insufficient documentation

## 2011-07-17 DIAGNOSIS — E7800 Pure hypercholesterolemia, unspecified: Secondary | ICD-10-CM | POA: Insufficient documentation

## 2011-07-19 NOTE — Op Note (Signed)
  NAME:  AMIR, FICK NO.:  [id number]  MEDICAL RECORD NO.:  [id number]  LOCATION:                                 FACILITY:  PHYSICIAN:  Thornton Park. Daphine Deutscher, MD  DATE OF BIRTH:  [birth year]  DATE OF PROCEDURE:  06/23/2011 DATE OF DISCHARGE:                              OPERATIVE REPORT   PREOPERATIVE DIAGNOSIS:  Left inguinal hernia.  POSTOPERATIVE DIAGNOSIS:  Left indirect sliding inguinal hernia containing colon as a part of the wall.  PROCEDURE:  Left inguinal herniorrhaphy with takedown of colon and reduction, high ligation of sac, and repair of the floor with a piece of Marlex mesh sutured along the inguinal ligament medially.  SURGEON:  Thornton Park. Daphine Deutscher, MD  ANESTHESIA:  General endotracheal.  DESCRIPTION OF PROCEDURE:  This 65 year old white male was taken to room 3 in [facility] and given general anesthesia.  This was done endotracheally.  We previously marked the left groin.  An oblique incision was made and carried down through further generous fatty layer to the external oblique which was bulging.  I dissected free a prominent left inguinal indirect hernia; and after opening the sac, I went out and found colon was part of the wall.  I then with few sharp dissection to take it off the sac and then pushed it up inside and then sewed the sac with a running horizontal mattress suture of 2-0 silk.  This was tied down.  The excess sac was amputated and the sac retracted up in the abdomen.  I then repaired the floor with a piece of Marlex mesh cut to fit and sutured it running along the inguinal ligament.  I also took out a little properitoneal fat hernia from the floor which would be more of a direct hernia and then covered that with this mesh as well.  The mesh was sutured to itself beyond the ring and then tucked beneath the external oblique.  The external oblique was then closed with running 2-0 Vicryl.  4-0 Vicryl was used in subcutaneous  tissue and the skin was closed with running subcuticular 4-0 Monocryl and Dermabond on the __________ .  Prior to closing, 10 mL of 0.25% Marcaine with epi was injected in the wound.  The patient will be given some Percocet to take for pain.     Thornton Park Daphine Deutscher, MD     MBM/MEDQ  D:  06/23/2011  T:  06/23/2011  Job:  [id number]  Electronically Signed by Luretha Murphy MD on 07/19/2011 01:36:37 PM

## 2011-07-26 ENCOUNTER — Ambulatory Visit (INDEPENDENT_AMBULATORY_CARE_PROVIDER_SITE_OTHER): Payer: 59 | Admitting: Surgery

## 2011-07-26 ENCOUNTER — Encounter (INDEPENDENT_AMBULATORY_CARE_PROVIDER_SITE_OTHER): Payer: Self-pay | Admitting: Surgery

## 2011-07-26 VITALS — BP 164/80 | HR 88

## 2011-07-26 DIAGNOSIS — K4090 Unilateral inguinal hernia, without obstruction or gangrene, not specified as recurrent: Secondary | ICD-10-CM

## 2011-07-26 NOTE — Progress Notes (Signed)
Arthur Berry returns today after his left inguinal hernia repair with mesh. His incision is healed just fine. It was closed with Dermabond. Postop he felt like he thought a suture was opening up he went to the emergency room. They reassured him that everything was okay. I reassured him that the firmness he feels areas normal postoperative induration. He will return to work on Monday, July 31, 2011.

## 2011-08-03 LAB — DIFFERENTIAL
Basophils Absolute: 0.1
Basophils Relative: 1
Eosinophils Absolute: 0.4
Eosinophils Relative: 5
Neutrophils Relative %: 63

## 2011-08-03 LAB — CBC
HCT: 45.2
MCHC: 34.9
MCV: 91.2
Platelets: 173
RBC: 4.96
WBC: 8

## 2011-08-03 LAB — COMPREHENSIVE METABOLIC PANEL
BUN: 11
CO2: 31
Chloride: 98
Creatinine, Ser: 1.04
GFR calc non Af Amer: >60
Total Bilirubin: 1

## 2011-08-04 LAB — COMPREHENSIVE METABOLIC PANEL
ALT: 23
AST: 22
Albumin: 4
Alkaline Phosphatase: 97
CO2: 30
Chloride: 101
Creatinine, Ser: 0.8
GFR calc Af Amer: >60
Potassium: 3.8
Sodium: 140
Total Bilirubin: 0.7

## 2011-08-04 LAB — DIFFERENTIAL
Basophils Absolute: 0
Eosinophils Absolute: 0.4
Eosinophils Relative: 5
Lymphocytes Relative: 24
Monocytes Absolute: 0.6

## 2011-08-04 LAB — CBC
Platelets: 160
RBC: 4.62
WBC: 8.4

## 2011-08-08 LAB — POCT CARDIAC MARKERS
Myoglobin, poc: 106
Operator id: 146091

## 2011-08-08 LAB — CBC
HCT: 40.5
MCV: 90.6
Platelets: 158
RDW: 13.2

## 2011-08-08 LAB — POCT I-STAT, CHEM 8
Hemoglobin: 14.3
Potassium: 3.1 — ABNORMAL LOW
Sodium: 139
TCO2: 31

## 2011-08-08 LAB — DIFFERENTIAL
Basophils Absolute: 0.1
Eosinophils Absolute: 0.3
Eosinophils Relative: 5
Lymphocytes Relative: 22
Neutrophils Relative %: 60

## 2011-08-10 LAB — BASIC METABOLIC PANEL
BUN: 15
CO2: 32
Chloride: 101
Creatinine, Ser: 0.79
Glucose, Bld: 181 — ABNORMAL HIGH

## 2011-08-24 LAB — BASIC METABOLIC PANEL
BUN: 9
CO2: 30
Chloride: 100
Glucose, Bld: 253 — ABNORMAL HIGH
Potassium: 3.8

## 2011-08-24 LAB — POCT HEMOGLOBIN-HEMACUE: Operator id: 123881

## 2011-10-17 ENCOUNTER — Other Ambulatory Visit (HOSPITAL_BASED_OUTPATIENT_CLINIC_OR_DEPARTMENT_OTHER): Payer: Self-pay | Admitting: General Surgery

## 2011-10-17 ENCOUNTER — Ambulatory Visit (HOSPITAL_COMMUNITY)
Admission: RE | Admit: 2011-10-17 | Discharge: 2011-10-17 | Disposition: A | Discharge status: Home / Self Care | Payer: 59 | Source: Ambulatory Visit | Attending: General Surgery | Admitting: General Surgery

## 2011-10-17 ENCOUNTER — Encounter (HOSPITAL_BASED_OUTPATIENT_CLINIC_OR_DEPARTMENT_OTHER): Payer: 59 | Attending: General Surgery

## 2011-10-17 DIAGNOSIS — Z79899 Other long term (current) drug therapy: Secondary | ICD-10-CM | POA: Insufficient documentation

## 2011-10-17 DIAGNOSIS — G4730 Sleep apnea, unspecified: Secondary | ICD-10-CM | POA: Insufficient documentation

## 2011-10-17 DIAGNOSIS — Z7982 Long term (current) use of aspirin: Secondary | ICD-10-CM | POA: Insufficient documentation

## 2011-10-17 DIAGNOSIS — M25579 Pain in unspecified ankle and joints of unspecified foot: Secondary | ICD-10-CM | POA: Insufficient documentation

## 2011-10-17 DIAGNOSIS — T148XXA Other injury of unspecified body region, initial encounter: Secondary | ICD-10-CM

## 2011-10-17 DIAGNOSIS — R609 Edema, unspecified: Secondary | ICD-10-CM | POA: Insufficient documentation

## 2011-10-17 DIAGNOSIS — E119 Type 2 diabetes mellitus without complications: Secondary | ICD-10-CM | POA: Insufficient documentation

## 2011-10-17 DIAGNOSIS — L84 Corns and callosities: Secondary | ICD-10-CM | POA: Insufficient documentation

## 2011-10-17 DIAGNOSIS — G40909 Epilepsy, unspecified, not intractable, without status epilepticus: Secondary | ICD-10-CM | POA: Insufficient documentation

## 2011-10-17 DIAGNOSIS — M79609 Pain in unspecified limb: Secondary | ICD-10-CM | POA: Insufficient documentation

## 2011-10-17 DIAGNOSIS — I10 Essential (primary) hypertension: Secondary | ICD-10-CM | POA: Insufficient documentation

## 2011-10-18 ENCOUNTER — Encounter (HOSPITAL_BASED_OUTPATIENT_CLINIC_OR_DEPARTMENT_OTHER): Payer: 59

## 2011-10-18 NOTE — H&P (Signed)
NAME:  Arthur Berry, Arthur Berry                     ACCOUNT NO.:  MEDICAL RECORD NO.:  [id number]  LOCATION:                                 FACILITY:  PHYSICIAN:  Joanne Gavel, M.D.        DATE OF BIRTH:  [birth year]  DATE OF ADMISSION: DATE OF DISCHARGE:                             HISTORY & PHYSICAL   CHIEF COMPLAINT:  Pain, right foot.  HISTORY OF PRESENT ILLNESS:  This is a 65 year old male diabetic for approximately 10 years, on oral medication.  Several months ago developed pain at the base of the first toe.  He also noticed some callus formation.  There has been no skin breakdown, been no fevers, chills or sweating spells.  The pain is basically with walking.  He has no pain at rest.  There is no pain in the leg although there has been some swelling ankle.  PAST MEDICAL HISTORY:  Significant for diabetes type 2, epilepsy, hypertension, kidney stones, and sleep apnea.  PAST SURGICAL HISTORY:  Inguinal hernia repair, kidney stone surgery, cholecystectomy and arthroscopic left knee surgery.  Cigarettes none.  Alcohol none.  ALLERGIES:  None.  MEDICATIONS:  Dilantin, Keppra, Coreg, amlodipine, Lotensin aspirin and Glucovance.  REVIEW OF SYSTEMS:  As above.  PHYSICAL EXAMINATION:  HEAD, EYES, EARS, NOSE, AND THROAT:  Normal. Normal symmetrical strength. CHEST:  Clear. HEART:  Regular rhythm. ABDOMEN:  Not examined. EXTREMITIES:  Examination of the lower extremities reveals 1+ pitting edema of both ankles.  There is some loss of sensation of the right great toe but that is the only evidence of any neuropathy.  There is a small callus at the base of right great toe and a sizable bunion on the left foot is normal.  IMPRESSION:  No diabetic foot ulcers.  Pain possibly secondary to bunion or sesamoid bone.  PLAN:  X-ray foot and referred to orthopedics.     Joanne Gavel, M.D.     RA/MEDQ  D:  10/17/2011  T:  10/17/2011  Job:  [id number]

## 2012-01-11 DIAGNOSIS — N200 Calculus of kidney: Secondary | ICD-10-CM | POA: Diagnosis not present

## 2012-02-06 DIAGNOSIS — G4733 Obstructive sleep apnea (adult) (pediatric): Secondary | ICD-10-CM | POA: Diagnosis not present

## 2012-02-06 DIAGNOSIS — G4700 Insomnia, unspecified: Secondary | ICD-10-CM | POA: Diagnosis not present

## 2012-02-09 DIAGNOSIS — G4710 Hypersomnia, unspecified: Secondary | ICD-10-CM | POA: Diagnosis not present

## 2012-02-09 DIAGNOSIS — R569 Unspecified convulsions: Secondary | ICD-10-CM | POA: Diagnosis not present

## 2012-02-09 DIAGNOSIS — G4733 Obstructive sleep apnea (adult) (pediatric): Secondary | ICD-10-CM | POA: Diagnosis not present

## 2012-02-09 DIAGNOSIS — G4700 Insomnia, unspecified: Secondary | ICD-10-CM | POA: Diagnosis not present

## 2012-03-27 DIAGNOSIS — H903 Sensorineural hearing loss, bilateral: Secondary | ICD-10-CM | POA: Diagnosis not present

## 2012-04-14 ENCOUNTER — Inpatient Hospital Stay (HOSPITAL_COMMUNITY): Payer: 59

## 2012-04-14 ENCOUNTER — Inpatient Hospital Stay (HOSPITAL_COMMUNITY)
Admission: EM | Admit: 2012-04-14 | Discharge: 2012-04-18 | DRG: 557 | Disposition: A | Discharge status: Skilled Nursing Facility | Payer: 59 | Attending: Family Medicine | Admitting: Family Medicine

## 2012-04-14 ENCOUNTER — Emergency Department (HOSPITAL_COMMUNITY): Payer: 59

## 2012-04-14 ENCOUNTER — Encounter (HOSPITAL_COMMUNITY): Payer: Self-pay

## 2012-04-14 DIAGNOSIS — N179 Acute kidney failure, unspecified: Secondary | ICD-10-CM | POA: Diagnosis present

## 2012-04-14 DIAGNOSIS — E860 Dehydration: Secondary | ICD-10-CM | POA: Diagnosis present

## 2012-04-14 DIAGNOSIS — E119 Type 2 diabetes mellitus without complications: Secondary | ICD-10-CM | POA: Diagnosis not present

## 2012-04-14 DIAGNOSIS — M79609 Pain in unspecified limb: Secondary | ICD-10-CM | POA: Diagnosis not present

## 2012-04-14 DIAGNOSIS — L02419 Cutaneous abscess of limb, unspecified: Secondary | ICD-10-CM | POA: Diagnosis present

## 2012-04-14 DIAGNOSIS — G40909 Epilepsy, unspecified, not intractable, without status epilepticus: Secondary | ICD-10-CM | POA: Diagnosis present

## 2012-04-14 DIAGNOSIS — G4730 Sleep apnea, unspecified: Secondary | ICD-10-CM | POA: Diagnosis present

## 2012-04-14 DIAGNOSIS — M25559 Pain in unspecified hip: Secondary | ICD-10-CM | POA: Diagnosis not present

## 2012-04-14 DIAGNOSIS — E1300 Other specified diabetes mellitus with hyperosmolarity without nonketotic hyperglycemic-hyperosmolar coma (NKHHC): Secondary | ICD-10-CM | POA: Diagnosis present

## 2012-04-14 DIAGNOSIS — E118 Type 2 diabetes mellitus with unspecified complications: Secondary | ICD-10-CM

## 2012-04-14 DIAGNOSIS — Z9089 Acquired absence of other organs: Secondary | ICD-10-CM | POA: Diagnosis not present

## 2012-04-14 DIAGNOSIS — Z87891 Personal history of nicotine dependence: Secondary | ICD-10-CM

## 2012-04-14 DIAGNOSIS — M6282 Rhabdomyolysis: Secondary | ICD-10-CM | POA: Diagnosis not present

## 2012-04-14 DIAGNOSIS — S8990XA Unspecified injury of unspecified lower leg, initial encounter: Secondary | ICD-10-CM | POA: Diagnosis not present

## 2012-04-14 DIAGNOSIS — R6889 Other general symptoms and signs: Secondary | ICD-10-CM | POA: Diagnosis not present

## 2012-04-14 DIAGNOSIS — E1301 Other specified diabetes mellitus with hyperosmolarity with coma: Secondary | ICD-10-CM | POA: Diagnosis present

## 2012-04-14 DIAGNOSIS — R079 Chest pain, unspecified: Secondary | ICD-10-CM | POA: Diagnosis not present

## 2012-04-14 DIAGNOSIS — L02619 Cutaneous abscess of unspecified foot: Secondary | ICD-10-CM

## 2012-04-14 DIAGNOSIS — L03119 Cellulitis of unspecified part of limb: Secondary | ICD-10-CM | POA: Diagnosis present

## 2012-04-14 DIAGNOSIS — S8780XA Crushing injury of unspecified lower leg, initial encounter: Secondary | ICD-10-CM

## 2012-04-14 DIAGNOSIS — E1165 Type 2 diabetes mellitus with hyperglycemia: Secondary | ICD-10-CM | POA: Diagnosis not present

## 2012-04-14 DIAGNOSIS — I10 Essential (primary) hypertension: Secondary | ICD-10-CM | POA: Diagnosis not present

## 2012-04-14 DIAGNOSIS — D72829 Elevated white blood cell count, unspecified: Secondary | ICD-10-CM | POA: Diagnosis present

## 2012-04-14 DIAGNOSIS — M7989 Other specified soft tissue disorders: Secondary | ICD-10-CM | POA: Diagnosis not present

## 2012-04-14 DIAGNOSIS — L03115 Cellulitis of right lower limb: Secondary | ICD-10-CM | POA: Diagnosis present

## 2012-04-14 DIAGNOSIS — L03039 Cellulitis of unspecified toe: Secondary | ICD-10-CM | POA: Diagnosis not present

## 2012-04-14 HISTORY — DX: Chronic kidney disease, unspecified: N18.9

## 2012-04-14 HISTORY — DX: Myoneural disorder, unspecified: G70.9

## 2012-04-14 LAB — GLUCOSE, CAPILLARY

## 2012-04-14 LAB — CBC
Hemoglobin: 17.5 g/dL — ABNORMAL HIGH (ref 13.0–17.0)
MCH: 31.7 pg (ref 26.0–34.0)
MCV: 87.5 fL (ref 78.0–100.0)
RBC: 5.52 MIL/uL (ref 4.22–5.81)

## 2012-04-14 LAB — COMPREHENSIVE METABOLIC PANEL
Alkaline Phosphatase: 152 U/L — ABNORMAL HIGH (ref 39–117)
BUN: 30 mg/dL — ABNORMAL HIGH (ref 6–23)
GFR calc Af Amer: >90 mL/min (ref >90)
GFR calc non Af Amer: 89 mL/min — ABNORMAL LOW (ref >90)
Glucose, Bld: 342 mg/dL — ABNORMAL HIGH (ref 70–99)
Potassium: 3.4 mEq/L — ABNORMAL LOW (ref 3.5–5.1)
Total Bilirubin: 1.1 mg/dL (ref 0.3–1.2)
Total Protein: 8.7 g/dL — ABNORMAL HIGH (ref 6.0–8.3)

## 2012-04-14 LAB — CREATININE, SERUM: GFR calc Af Amer: >90 mL/min (ref >90)

## 2012-04-14 LAB — DIFFERENTIAL
Eosinophils Absolute: 0 10*3/uL (ref 0.0–0.7)
Lymphs Abs: 1 10*3/uL (ref 0.7–4.0)
Monocytes Relative: 8 % (ref 3–12)
Neutrophils Relative %: 88 % — ABNORMAL HIGH (ref 43–77)

## 2012-04-14 MED ORDER — HYDROCODONE-ACETAMINOPHEN 5-325 MG PO TABS: 1.0000 | ORAL_TABLET | ORAL | Status: DC | PRN

## 2012-04-14 MED ORDER — PHENYTOIN SODIUM EXTENDED 100 MG PO CAPS
300.0000 mg | ORAL_CAPSULE | Freq: Every day | ORAL | Status: DC
  Administered 2012-04-16 – 2012-04-17 (×3): 300 mg via ORAL
  Filled 2012-04-14 (×5): qty 3

## 2012-04-14 MED ORDER — LEVETIRACETAM 500 MG PO TABS
500.0000 mg | ORAL_TABLET | Freq: Two times a day (BID) | ORAL | Status: DC
  Administered 2012-04-14 – 2012-04-18 (×8): 500 mg via ORAL
  Filled 2012-04-14 (×10): qty 1

## 2012-04-14 MED ORDER — ASPIRIN EC 81 MG PO TBEC
81.0000 mg | DELAYED_RELEASE_TABLET | Freq: Every day | ORAL | Status: DC
  Administered 2012-04-15 – 2012-04-18 (×4): 81 mg via ORAL
  Filled 2012-04-14 (×4): qty 1

## 2012-04-14 MED ORDER — DOCUSATE SODIUM 100 MG PO CAPS
100.0000 mg | ORAL_CAPSULE | Freq: Two times a day (BID) | ORAL | Status: DC
  Administered 2012-04-14 – 2012-04-18 (×8): 100 mg via ORAL
  Filled 2012-04-14 (×9): qty 1

## 2012-04-14 MED ORDER — PANTOPRAZOLE SODIUM 40 MG PO TBEC
40.0000 mg | DELAYED_RELEASE_TABLET | Freq: Every day | ORAL | Status: DC
  Administered 2012-04-14 – 2012-04-18 (×5): 40 mg via ORAL
  Filled 2012-04-14 (×5): qty 1

## 2012-04-14 MED ORDER — ASPIRIN 81 MG PO CHEW
324.0000 mg | CHEWABLE_TABLET | Freq: Once | ORAL | Status: AC
  Administered 2012-04-14: 324 mg via ORAL
  Filled 2012-04-14: qty 2
  Filled 2012-04-14: qty 4

## 2012-04-14 MED ORDER — SODIUM CHLORIDE 0.9 % IV BOLUS (SEPSIS)
1000.0000 mL | Freq: Once | INTRAVENOUS | Status: AC
  Administered 2012-04-14: 1000 mL via INTRAVENOUS

## 2012-04-14 MED ORDER — ALUM & MAG HYDROXIDE-SIMETH 200-200-20 MG/5ML PO SUSP: 30.0000 mL | Freq: Four times a day (QID) | ORAL | Status: DC | PRN

## 2012-04-14 MED ORDER — INSULIN ASPART 100 UNIT/ML ~~LOC~~ SOLN
0.0000 [IU] | Freq: Three times a day (TID) | SUBCUTANEOUS | Status: DC
  Administered 2012-04-15 (×2): 3 [IU] via SUBCUTANEOUS
  Administered 2012-04-15 – 2012-04-16 (×2): 5 [IU] via SUBCUTANEOUS
  Administered 2012-04-16 – 2012-04-17 (×2): 3 [IU] via SUBCUTANEOUS
  Administered 2012-04-17 – 2012-04-18 (×4): 2 [IU] via SUBCUTANEOUS

## 2012-04-14 MED ORDER — POTASSIUM CHLORIDE 20 MEQ/15ML (10%) PO LIQD
40.0000 meq | Freq: Once | ORAL | Status: AC
  Administered 2012-04-14: 40 meq via ORAL
  Filled 2012-04-14: qty 30

## 2012-04-14 MED ORDER — ADULT MULTIVITAMIN W/MINERALS CH
1.0000 | ORAL_TABLET | Freq: Every day | ORAL | Status: DC
  Administered 2012-04-15 – 2012-04-18 (×4): 1 via ORAL
  Filled 2012-04-14 (×4): qty 1

## 2012-04-14 MED ORDER — SODIUM CHLORIDE 0.9 % IV BOLUS (SEPSIS)
500.0000 mL | Freq: Once | INTRAVENOUS | Status: AC
  Administered 2012-04-14: 500 mL via INTRAVENOUS

## 2012-04-14 MED ORDER — PIPERACILLIN-TAZOBACTAM 3.375 G IVPB
3.3750 g | Freq: Three times a day (TID) | INTRAVENOUS | Status: DC
  Administered 2012-04-15 (×3): 3.375 g via INTRAVENOUS
  Filled 2012-04-14 (×4): qty 50

## 2012-04-14 MED ORDER — ACETAMINOPHEN 325 MG PO TABS: 650.0000 mg | ORAL_TABLET | Freq: Four times a day (QID) | ORAL | Status: DC | PRN

## 2012-04-14 MED ORDER — INSULIN ASPART 100 UNIT/ML ~~LOC~~ SOLN
5.0000 [IU] | Freq: Once | SUBCUTANEOUS | Status: AC
  Administered 2012-04-14: 5 [IU] via INTRAVENOUS
  Filled 2012-04-14: qty 1

## 2012-04-14 MED ORDER — INSULIN ASPART 100 UNIT/ML ~~LOC~~ SOLN
3.0000 [IU] | Freq: Three times a day (TID) | SUBCUTANEOUS | Status: DC
  Administered 2012-04-15: 14:00:00 via SUBCUTANEOUS
  Administered 2012-04-15 (×2): 3 [IU] via SUBCUTANEOUS

## 2012-04-14 MED ORDER — VANCOMYCIN HCL 1000 MG IV SOLR
1250.0000 mg | INTRAVENOUS | Status: AC
  Administered 2012-04-14: 1250 mg via INTRAVENOUS
  Filled 2012-04-14: qty 1250

## 2012-04-14 MED ORDER — ACETAMINOPHEN 650 MG RE SUPP: 650.0000 mg | Freq: Four times a day (QID) | RECTAL | Status: DC | PRN

## 2012-04-14 MED ORDER — NIACIN 250 MG PO TABS
250.0000 mg | ORAL_TABLET | Freq: Every day | ORAL | Status: DC
  Administered 2012-04-15 – 2012-04-18 (×4): 250 mg via ORAL
  Filled 2012-04-14 (×4): qty 1

## 2012-04-14 MED ORDER — ZOLPIDEM TARTRATE 5 MG PO TABS: 5.0000 mg | ORAL_TABLET | Freq: Every evening | ORAL | Status: DC | PRN

## 2012-04-14 MED ORDER — LEVOFLOXACIN IN D5W 750 MG/150ML IV SOLN
750.0000 mg | INTRAVENOUS | Status: DC
  Administered 2012-04-15: 750 mg via INTRAVENOUS
  Filled 2012-04-14 (×2): qty 150

## 2012-04-14 MED ORDER — POTASSIUM CHLORIDE CRYS ER 20 MEQ PO TBCR
20.0000 meq | EXTENDED_RELEASE_TABLET | Freq: Every day | ORAL | Status: DC
  Administered 2012-04-15 – 2012-04-18 (×4): 20 meq via ORAL
  Filled 2012-04-14 (×5): qty 1

## 2012-04-14 MED ORDER — METOPROLOL TARTRATE 12.5 MG HALF TABLET
12.5000 mg | ORAL_TABLET | Freq: Two times a day (BID) | ORAL | Status: DC
  Administered 2012-04-14 – 2012-04-18 (×8): 12.5 mg via ORAL
  Filled 2012-04-14 (×11): qty 1

## 2012-04-14 MED ORDER — METOPROLOL TARTRATE 25 MG PO TABS: 12.5000 mg | ORAL_TABLET | Freq: Two times a day (BID) | ORAL | Status: DC

## 2012-04-14 MED ORDER — MORPHINE SULFATE 2 MG/ML IJ SOLN: 2.0000 mg | INTRAMUSCULAR | Status: DC | PRN

## 2012-04-14 MED ORDER — ENOXAPARIN SODIUM 40 MG/0.4ML ~~LOC~~ SOLN
40.0000 mg | SUBCUTANEOUS | Status: DC
  Administered 2012-04-14: 40 mg via SUBCUTANEOUS
  Filled 2012-04-14 (×2): qty 0.4

## 2012-04-14 MED ORDER — VANCOMYCIN HCL 1000 MG IV SOLR
1250.0000 mg | Freq: Two times a day (BID) | INTRAVENOUS | Status: DC
  Administered 2012-04-15 – 2012-04-16 (×3): 1250 mg via INTRAVENOUS
  Filled 2012-04-14 (×5): qty 1250

## 2012-04-14 MED ORDER — LEVOFLOXACIN IN D5W 750 MG/150ML IV SOLN
750.0000 mg | INTRAVENOUS | Status: AC
  Administered 2012-04-14: 750 mg via INTRAVENOUS
  Filled 2012-04-14: qty 150

## 2012-04-14 MED ORDER — POTASSIUM CHLORIDE IN NACL 20-0.9 MEQ/L-% IV SOLN
INTRAVENOUS | Status: DC
  Administered 2012-04-14 – 2012-04-15 (×2): via INTRAVENOUS
  Administered 2012-04-15: 125 mL/h via INTRAVENOUS
  Filled 2012-04-14 (×9): qty 1000

## 2012-04-14 MED ORDER — AMLODIPINE BESYLATE 5 MG PO TABS
5.0000 mg | ORAL_TABLET | Freq: Every day | ORAL | Status: DC
  Administered 2012-04-15 – 2012-04-18 (×4): 5 mg via ORAL
  Filled 2012-04-14 (×4): qty 1

## 2012-04-14 MED ORDER — PIPERACILLIN-TAZOBACTAM 3.375 G IVPB 30 MIN
3.3750 g | INTRAVENOUS | Status: AC
  Administered 2012-04-14: 3.375 g via INTRAVENOUS
  Filled 2012-04-14: qty 50

## 2012-04-14 MED ORDER — ONDANSETRON HCL 4 MG PO TABS: 4.0000 mg | ORAL_TABLET | Freq: Four times a day (QID) | ORAL | Status: DC | PRN

## 2012-04-14 MED ORDER — ONDANSETRON HCL 4 MG/2ML IJ SOLN: 4.0000 mg | Freq: Four times a day (QID) | INTRAMUSCULAR | Status: DC | PRN

## 2012-04-14 MED ORDER — INSULIN GLARGINE 100 UNIT/ML ~~LOC~~ SOLN
10.0000 [IU] | Freq: Every day | SUBCUTANEOUS | Status: DC
  Administered 2012-04-14: 10 [IU] via SUBCUTANEOUS

## 2012-04-14 MED ORDER — MORPHINE SULFATE 4 MG/ML IJ SOLN
4.0000 mg | Freq: Once | INTRAMUSCULAR | Status: AC
  Administered 2012-04-14: 4 mg via INTRAVENOUS
  Filled 2012-04-14: qty 1

## 2012-04-14 MED ORDER — SENNA 8.6 MG PO TABS
1.0000 | ORAL_TABLET | Freq: Two times a day (BID) | ORAL | Status: DC
  Administered 2012-04-14 – 2012-04-18 (×7): 8.6 mg via ORAL
  Filled 2012-04-14 (×9): qty 1

## 2012-04-14 NOTE — ED Notes (Signed)
On call vt called

## 2012-04-14 NOTE — ED Notes (Signed)
Pt knows that urine is needed 

## 2012-04-14 NOTE — Consult Note (Signed)
Reason for Consult:right leg pain Referring Physician: Dr. Tina Griffiths Berry is an 66 y.o. male.  HPI: Arthur Berry is a 66 year old patient whose right leg has been trapped between a book shelf in a chair for the past 3 days. He reports right leg pain and swelling. He reports some numbness in the right leg but no excruciating pain. He does describe some weakness when trying to lift the right leg. He denies any other orthopedic complaints.  Past Medical History  Diagnosis Date  . Colon polyp     Tubular Adenoma  . Diabetes mellitus 2001  . Hypertension 1999  . Arthritis   . Hyperlipidemia 1999  . Epilepsy   . Sleep apnea     Past Surgical History  Procedure Date  . Knee surgery     Left  . Cholecystectomy   . Hernia repair 06/27/11    [facility]    Family History  Problem Relation Age of Onset  . Breast cancer Sister   . Colon cancer    . Diabetes Sister     Social History:  reports that he has quit smoking. His smoking use included Cigars. He does not have any smokeless tobacco history on file. He reports that he does not drink alcohol. His drug history not on file.  Allergies: No Known Allergies  Medications: I have reviewed the patient's current medications.  Results for orders placed during the hospital encounter of 04/14/12 (from the past 48 hour(s))  CBC     Status: Abnormal   Collection Time   04/14/12  4:03 PM      Component Value Range Comment   WBC 22.9 (*) 4.0 - 10.5 (K/uL)    RBC 5.52  4.22 - 5.81 (MIL/uL)    Hemoglobin 17.5 (*) 13.0 - 17.0 (g/dL)    HCT 09.8  11.9 - 14.7 (%)    MCV 87.5  78.0 - 100.0 (fL)    MCH 31.7  26.0 - 34.0 (pg)    MCHC 36.2 (*) 30.0 - 36.0 (g/dL)    RDW 82.9  56.2 - 13.0 (%)    Platelets 258  150 - 400 (K/uL)   DIFFERENTIAL     Status: Abnormal   Collection Time   04/14/12  4:03 PM      Component Value Range Comment   Neutrophils Relative 88 (*) 43 - 77 (%)    Neutro Abs 20.1 (*) 1.7 - 7.7 (K/uL)    Lymphocytes Relative 4 (*) 12  - 46 (%)    Lymphs Abs 1.0  0.7 - 4.0 (K/uL)    Monocytes Relative 8  3 - 12 (%)    Monocytes Absolute 1.7 (*) 0.1 - 1.0 (K/uL)    Eosinophils Relative 0  0 - 5 (%)    Eosinophils Absolute 0.0  0.0 - 0.7 (K/uL)    Basophils Relative 0  0 - 1 (%)    Basophils Absolute 0.0  0.0 - 0.1 (K/uL)   COMPREHENSIVE METABOLIC PANEL     Status: Abnormal   Collection Time   04/14/12  4:03 PM      Component Value Range Comment   Sodium 141  135 - 145 (mEq/L)    Potassium 3.4 (*) 3.5 - 5.1 (mEq/L)    Chloride 96  96 - 112 (mEq/L)    CO2 28  19 - 32 (mEq/L)    Glucose, Bld 342 (*) 70 - 99 (mg/dL)    BUN 30 (*) 6 - 23 (mg/dL)  Creatinine, Ser 0.84  0.50 - 1.35 (mg/dL)    Calcium 16.1  8.4 - 10.5 (mg/dL)    Total Protein 8.7 (*) 6.0 - 8.3 (g/dL)    Albumin 4.3  3.5 - 5.2 (g/dL)    AST 79 (*) 0 - 37 (U/L)    ALT 32  0 - 53 (U/L)    Alkaline Phosphatase 152 (*) 39 - 117 (U/L)    Total Bilirubin 1.1  0.3 - 1.2 (mg/dL)    GFR calc non Af Amer 89 (*) >90 (mL/min)    GFR calc Af Amer >90  >90 (mL/min)   CARDIAC PANEL(CRET KIN+CKTOT+MB+TROPI)     Status: Abnormal   Collection Time   04/14/12  4:03 PM      Component Value Range Comment   Total CK 7500 (*) 7 - 232 (U/L)    CK, MB 68.9 (*) 0.3 - 4.0 (ng/mL)    Troponin I <0.30  <0.30 (ng/mL)    Relative Index 0.9  0.0 - 2.5      Dg Chest 2 View  04/14/2012  *RADIOLOGY REPORT*  Clinical Data: Chest pain.  History of diabetes.  CHEST - 2 VIEW  Comparison: Chest x-ray 07/17/2011.  Findings: Lung volumes are normal.  No consolidative airspace disease.  No pleural effusions.  No pneumothorax.  No pulmonary nodule or mass noted.  Pulmonary vasculature and the cardiomediastinal silhouette are within normal limits.  Lateral view and demonstrates a potential compression fracture in the mid lumbar spine which is incompletely visualized.  IMPRESSION: 1. No radiographic evidence of acute cardiopulmonary disease. 2.  Potential compression fracture the mid lumbar spine  incompletely visualized. If in fact this is real, this would be new compared to prior CT of abdomen and pelvis dated 05/09/2011. Clinical correlation for point tenderness in this region is recommended.  This could be better evaluated with dedicated lumbar spine radiographs if clinically indicated.  Original Report Authenticated By: Florencia Reasons, M.D.    Review of Systems  Constitutional: Negative.   HENT: Negative.   Eyes: Negative.   Respiratory: Negative.   Cardiovascular: Negative.   Gastrointestinal: Negative.   Musculoskeletal: Positive for joint pain.  Skin: Negative.   Neurological: Negative.   Endo/Heme/Allergies: Negative.   Psychiatric/Behavioral: Negative.    Blood pressure 152/82, temperature 98.2 F (36.8 C), temperature source Oral, resp. rate 22, SpO2 100.00%. Physical Exam  Constitutional: He appears well-developed.  HENT:  Head: Normocephalic.  Eyes: Pupils are equal, round, and reactive to light.  Neck: Normal range of motion.  Cardiovascular: Normal rate.   Respiratory: Effort normal.  Neurological: He is alert.  Skin: Skin is warm.  Psychiatric: He has a normal mood and affect.   examination of the right lower extremity demonstrates mild decrease in sensation the lateral and dorsal aspect of the foot and calf. No change in sensation on the medial aspect of the foot compared to the left-hand side. The right lower extremity does have swelling up to the mid thigh area compared to the left. His compartments are soft to palpation on the right-hand side in the thigh and foot. There is no pain with active or passive dorsiflexion of the foot and no pain with active or passive knee flexion and extension. Multiple blisters are present on the dorsal and ventral aspect of the leg. Multiple abrasions are present on the anterior tibial crest on the right. A muscle-type indentation is present mid thigh on the right. This is where the leg was weighed chest. Pedal  pulses are  palpable bilaterally. No groin pain within the internal and external rotation of the leg.  Assessment/Plan: Impression is right lower extremity crushtype injury with no evidence of compartment syndrome. CK is elevated as would be expected. Creatinine is normal. Blistering is present on the leg but there is no evidence of compartment syndrome. I anticipate that the leg swelling will resolve over time. I would favor blister treatment with Bactroban cream Telfa and wrap Ace wrap of the leg followed by calf and thigh SCDs and CPM machine when the patient is in bed to facilitate movement of the extremity. Physical therapy consult for weightbearing as tolerated right lower extremity. Elevate the leg as much as possible when the patient is recumbent. We'll check femur and tibia x-rays for completeness.  Shima Compere SCOTT 04/14/2012, 7:48 PM

## 2012-04-14 NOTE — Progress Notes (Signed)
VASCULAR LAB PRELIMINARY  PRELIMINARY  PRELIMINARY  PRELIMINARY  Right lower extremity venous Doppler completed.    Preliminary report:  There is no DVT or SVT noted in the right lower extremity.  Sherren Kerns [place], 04/14/2012, 5:57 PM

## 2012-04-14 NOTE — ED Notes (Signed)
Per primary RN Egwuatu, [facility] bed request upgraded to [facility]

## 2012-04-14 NOTE — ED Provider Notes (Addendum)
66 year old male who tripped and fell at home and got wedged between his CPAP machine and the wound was unable to get up. He has been tapped there for 2 days. Is complaining of pain in his right leg and in his left shoulder. Exam shows a markedly swollen right leg with strong pulses capillary refill is delayed to 5 seconds but this is symmetric compared with the left side. There is some formation and some areas were he has had some desquamation in his right leg. There is an area of the anterior aspect of the proximal thigh which is dark in appearance and may indeed be a small area of necrosis of skin. He'll need to be evaluated for rhabdomyolysis, possible DVT. He also will need to be evaluated for possibility of compartment syndrome although his level of pain is not so high that I have a strong index of suspicion.   Date: 04/14/2012  Rate: 109  Rhythm: sinus tachycardia  QRS Axis: normal  Intervals: normal  ST/T Wave abnormalities: ST depression in the inferior and anterolateral leads  Conduction Disutrbances:none  Narrative Interpretation: Left atrial hypertrophy and right atrial hypertrophy. ST depression in the inferior and anterolateral leads which is somewhat more pronounced than on prior ECG of 06/20/2011.  Old EKG Reviewed: changes noted    Dione Booze, MD 04/14/12 1548  Laboratory workup confirms presence of rhabdomyolysis. Saline infusion is started and arrangements are made to admit the patient. Orthopedic consultation will also be obtained.  CRITICAL CARE Performed by: Dione Booze   Total critical care time: 60 minutes  Critical care time was exclusive of separately billable procedures and treating other patients.  Critical care was necessary to treat or prevent imminent or life-threatening deterioration.  Critical care was time spent personally by me on the following activities: development of treatment plan with patient and/or surrogate as well as nursing, discussions with  consultants, evaluation of patient's response to treatment, examination of patient, obtaining history from patient or surrogate, ordering and performing treatments and interventions, ordering and review of laboratory studies, ordering and review of radiographic studies, pulse oximetry and re-evaluation of patient's condition.   Dione Booze, MD 04/14/12 1745

## 2012-04-14 NOTE — ED Notes (Signed)
Attempted to call report nurse busy will call me back

## 2012-04-14 NOTE — ED Notes (Signed)
CBG 344, BP 190/120, cool

## 2012-04-14 NOTE — Consult Note (Signed)
Reason for Consult:cellulitis right lower extremity  And swelling Referring Physician: Ransome Helwig Arthur Berry is an 66 y.o. Berry.  HPI: Asked to see pt at the request of Dr Venetia Constable of [facility] due to swollen right lower extremity.  Pt had right leg trapped in a recliner chair for 2 days before being found.  C/O swelling and moderate discomfort RLE.  Past Medical History  Diagnosis Date  . Colon polyp     Tubular Adenoma  . Diabetes mellitus 2001  . Hypertension 1999  . Arthritis   . Hyperlipidemia 1999  . Epilepsy   . Sleep apnea     Past Surgical History  Procedure Date  . Knee surgery     Left  . Cholecystectomy   . Hernia repair 06/27/11    [facility]    Family History  Problem Relation Age of Onset  . Breast cancer Sister   . Colon cancer    . Diabetes Sister     Social History:  reports that he has quit smoking. His smoking use included Cigars. He does not have any smokeless tobacco history on file. He reports that he does not drink alcohol. His drug history not on file.  Allergies: No Known Allergies  Medications:  I have reviewed the patient's current medications. Prior to Admission:  (Not in a hospital admission) Scheduled:   . aspirin  324 mg Oral Once  . insulin aspart  0-15 Units Subcutaneous TID WC  . insulin aspart  3 Units Subcutaneous TID WC  . insulin aspart  5 Units Intravenous Once  . insulin glargine  10 Units Subcutaneous QHS  . levofloxacin (LEVAQUIN) IV  750 mg Intravenous To Major  . levofloxacin (LEVAQUIN) IV  750 mg Intravenous Q24H  .  morphine injection  4 mg Intravenous Once  . piperacillin-tazobactam  3.375 g Intravenous To Major  . piperacillin-tazobactam (ZOSYN)  IV  3.375 g Intravenous Q8H  . potassium chloride  40 mEq Oral Once  . sodium chloride  1,000 mL Intravenous Once  . sodium chloride  500 mL Intravenous Once  . sodium chloride  500 mL Intravenous Once  . vancomycin  1,250 mg Intravenous To Major  . vancomycin  1,250 mg  Intravenous Q12H    Results for orders placed during the hospital encounter of 04/14/12 (from the past 48 hour(s))  CBC     Status: Abnormal   Collection Time   04/14/12  4:03 PM      Component Value Range Comment   WBC 22.9 (*) 4.0 - 10.5 (K/uL)    RBC 5.52  4.22 - 5.81 (MIL/uL)    Hemoglobin 17.5 (*) 13.0 - 17.0 (g/dL)    HCT 78.2  95.6 - 21.3 (%)    MCV 87.5  78.0 - 100.0 (fL)    MCH 31.7  26.0 - 34.0 (pg)    MCHC 36.2 (*) 30.0 - 36.0 (g/dL)    RDW 08.6  57.8 - 46.9 (%)    Platelets 258  150 - 400 (K/uL)   DIFFERENTIAL     Status: Abnormal   Collection Time   04/14/12  4:03 PM      Component Value Range Comment   Neutrophils Relative 88 (*) 43 - 77 (%)    Neutro Abs 20.1 (*) 1.7 - 7.7 (K/uL)    Lymphocytes Relative 4 (*) 12 - 46 (%)    Lymphs Abs 1.0  0.7 - 4.0 (K/uL)    Monocytes Relative 8  3 - 12 (%)  Monocytes Absolute 1.7 (*) 0.1 - 1.0 (K/uL)    Eosinophils Relative 0  0 - 5 (%)    Eosinophils Absolute 0.0  0.0 - 0.7 (K/uL)    Basophils Relative 0  0 - 1 (%)    Basophils Absolute 0.0  0.0 - 0.1 (K/uL)   COMPREHENSIVE METABOLIC PANEL     Status: Abnormal   Collection Time   04/14/12  4:03 PM      Component Value Range Comment   Sodium 141  135 - 145 (mEq/L)    Potassium 3.4 (*) 3.5 - 5.1 (mEq/L)    Chloride 96  96 - 112 (mEq/L)    CO2 28  19 - 32 (mEq/L)    Glucose, Bld 342 (*) 70 - 99 (mg/dL)    BUN 30 (*) 6 - 23 (mg/dL)    Creatinine, Ser 3.08  0.50 - 1.35 (mg/dL)    Calcium 65.7  8.4 - 10.5 (mg/dL)    Total Protein 8.7 (*) 6.0 - 8.3 (g/dL)    Albumin 4.3  3.5 - 5.2 (g/dL)    AST 79 (*) 0 - 37 (U/L)    ALT 32  0 - 53 (U/L)    Alkaline Phosphatase 152 (*) 39 - 117 (U/L)    Total Bilirubin 1.1  0.3 - 1.2 (mg/dL)    GFR calc non Af Amer 89 (*) >90 (mL/min)    GFR calc Af Amer >90  >90 (mL/min)   CARDIAC PANEL(CRET KIN+CKTOT+MB+TROPI)     Status: Abnormal   Collection Time   04/14/12  4:03 PM      Component Value Range Comment   Total CK 7500 (*) 7 - 232 (U/L)      CK, MB 68.9 (*) 0.3 - 4.0 (ng/mL)    Troponin I <0.30  <0.30 (ng/mL)    Relative Index 0.9  0.0 - 2.5      Dg Chest 2 View  04/14/2012  *RADIOLOGY REPORT*  Clinical Data: Chest pain.  History of diabetes.  CHEST - 2 VIEW  Comparison: Chest x-ray 07/17/2011.  Findings: Lung volumes are normal.  No consolidative airspace disease.  No pleural effusions.  No pneumothorax.  No pulmonary nodule or mass noted.  Pulmonary vasculature and the cardiomediastinal silhouette are within normal limits.  Lateral view and demonstrates a potential compression fracture in the mid lumbar spine which is incompletely visualized.  IMPRESSION: 1. No radiographic evidence of acute cardiopulmonary disease. 2.  Potential compression fracture the mid lumbar spine incompletely visualized. If in fact this is real, this would be new compared to prior CT of abdomen and pelvis dated 05/09/2011. Clinical correlation for point tenderness in this region is recommended.  This could be better evaluated with dedicated lumbar spine radiographs if clinically indicated.  Original Report Authenticated By: Florencia Reasons, M.D.    Review of Systems  Constitutional: Negative for fever and chills.  HENT: Negative.   Eyes: Negative.   Respiratory: Negative.   Cardiovascular: Negative.   Gastrointestinal: Negative.   Genitourinary: Negative.   Musculoskeletal:       Right leg pain  Skin: Negative.   Neurological: Positive for weakness.  Endo/Heme/Allergies: Negative.    Blood pressure 152/82, temperature 98.2 F (36.8 C), temperature source Oral, resp. rate 22, SpO2 100.00%. Physical Exam  Constitutional: He is oriented to person, place, and time. He appears well-developed and well-nourished.  HENT:  Head: Normocephalic and atraumatic.  Eyes: EOM are normal. Pupils are equal, round, and reactive to light.  Neck:  Normal range of motion. Neck supple.  Musculoskeletal:       Right upper leg: He exhibits swelling and edema.        Right lower leg: He exhibits swelling and edema.       Legs: Neurological: He is alert and oriented to person, place, and time.  Psychiatric: He has a normal mood and affect. His behavior is normal. Judgment and thought content normal.    Assessment/Plan: Swollen Right lower extremity secondary to prolonged period of limb trapped in recliner chair.  Agree with IVF and control of diabetes.  Agree with watching for rhabdomyolysis.  Agree with duplex to evaluate for DVT.  Would consider heparin if not contraindicated.  The blisters are superficial and require no treatment  Except keep clean.  Will follow up Monday.  Eduarda Scrivens A. 04/14/2012, 7:04 PM

## 2012-04-14 NOTE — Progress Notes (Signed)
ANTIBIOTIC CONSULT NOTE - INITIAL  Pharmacy Consult for vanc/zosyn/levaquin Indication: Cellulitis  No Known Allergies  Vital Signs: Temp: 98.2 F (36.8 C) (06/02 1532) Temp src: Oral (06/02 1532) BP: 152/82 mmHg (06/02 1752)  Labs:  Basename 04/14/12 1603  WBC 22.9*  HGB 17.5*  PLT 258  LABCREA --  CREATININE 0.84   Microbiology: No results found for this or any previous visit (from the past 720 hour(s)).  Medical History: Past Medical History  Diagnosis Date  . Colon polyp     Tubular Adenoma  . Diabetes mellitus 2001  . Hypertension 1999  . Arthritis   . Hyperlipidemia 1999  . Epilepsy   . Sleep apnea     Medications:  Scheduled:    . aspirin  324 mg Oral Once  . insulin aspart  5 Units Intravenous Once  .  morphine injection  4 mg Intravenous Once  . potassium chloride  40 mEq Oral Once  . sodium chloride  1,000 mL Intravenous Once  . sodium chloride  500 mL Intravenous Once  . sodium chloride  500 mL Intravenous Once   Assessment: 66 yo who was admitted for a lower extremity cellulitis. Pt was found down after 2 days. Also being r/o for rhabdo. Empiric abx with vanc  Goal of Therapy:  Vancomycin trough level 10-15 mcg/ml  Plan:  1. Vanc 1250mg  IV q12 2. Zosyn 3.375g IV q8 3. Levaquin 750mg  IV q24  Arthur Berry 04/14/2012,6:33 PM

## 2012-04-14 NOTE — ED Notes (Signed)
Patient being transported upstairs with Christia Reading, Charity fundraiser.

## 2012-04-14 NOTE — ED Notes (Addendum)
Pt return to room from xray. 

## 2012-04-14 NOTE — ED Provider Notes (Signed)
History     CSN: [id number]  Arrival date & time 04/14/12  1518   First MD Initiated Contact with Patient 04/14/12 1520      Chief Complaint  Patient presents with  . Fall    (Consider location/radiation/quality/duration/timing/severity/associated sxs/prior treatment) HPI Comments: Patient reports that he fell and was trapped between two parts of his recliner at 6pm Friday evening, approximately 45 hours prior to arrival in the ED.  He was found by his children just prior to arrival in the ED.  He lives by himself and was unable to reach a telephone while stuck.    He is now having significant right lower extremity edema and pain  from his right upper thigh distally.  He currently rates the pain of his lower extremity as a 5 out of 10.  He reports that his leg feels cold.  He denies any numbness or tingling.  He reports that he has also had some anterior chest pain for the past 2 days.  He reports that the pain has decreased since onset. Pain has been constant.  He denies nausea or vomiting.  He denies fever or chills.  He denies shortness of breath.  Patient is diabetic.  Patient is a 66 y.o. male presenting with fall. The history is provided by the patient.  Fall Pertinent negatives include no fever, no nausea, no vomiting and no headaches.    Past Medical History  Diagnosis Date  . Colon polyp     Tubular Adenoma  . Diabetes mellitus 2001  . Hypertension 1999  . Arthritis   . Hyperlipidemia 1999  . Epilepsy   . Sleep apnea     Past Surgical History  Procedure Date  . Knee surgery     Left  . Cholecystectomy   . Hernia repair 06/27/11    [facility]    Family History  Problem Relation Age of Onset  . Breast cancer Sister   . Colon cancer    . Diabetes Sister     History  Substance Use Topics  . Smoking status: Former Smoker    Types: Cigars  . Smokeless tobacco: Not on file   Comment: Quit cigars in the 1980s  . Alcohol Use: No      Review of Systems    Constitutional: Negative for fever, chills and diaphoresis.  Respiratory: Negative for shortness of breath.   Cardiovascular: Positive for chest pain.  Gastrointestinal: Negative for nausea and vomiting.  Genitourinary: Negative for decreased urine volume.  Neurological: Negative for dizziness, syncope, light-headedness and headaches.  All other systems reviewed and are negative.    Allergies  Review of patient's allergies indicates no known allergies.  Home Medications   Current Outpatient Rx  Name Route Sig Dispense Refill  . AMLODIPINE BESYLATE 5 MG PO TABS Oral Take 5 mg by mouth daily.    . ASPIRIN EC 81 MG PO TBEC Oral Take 81 mg by mouth daily.    Marland Kitchen BENAZEPRIL HCL 40 MG PO TABS Oral Take 40 mg by mouth daily.      Marland Kitchen VITAMIN D 1000 UNITS PO TABS Oral Take 1,000 Units by mouth daily.      Marland Kitchen FISH OIL OIL Oral Take 1 tablet by mouth daily.      . GLYBURIDE-METFORMIN 2.5-500 MG PO TABS Oral Take 1 tablet by mouth 2 (two) times daily with a meal.    . LEVETIRACETAM 500 MG PO TABS Oral Take 500 mg by mouth 2 (two) times daily.      Marland Kitchen  NIACIN 250 MG PO TABS Oral Take 250 mg by mouth daily.      Marland Kitchen PHENYTOIN SODIUM EXTENDED 100 MG PO CAPS Oral Take 300 mg by mouth at bedtime.     Marland Kitchen POTASSIUM CHLORIDE CRYS ER 20 MEQ PO TBCR Oral Take 20 mEq by mouth daily.       BP 180/99  Temp(Src) 98.2 F (36.8 C) (Oral)  Resp 18  SpO2 100%  Physical Exam  Nursing note and vitals reviewed. Constitutional: He appears well-developed and well-nourished. No distress.  HENT:  Head: Normocephalic and atraumatic.  Mouth/Throat: Oropharynx is clear and moist.  Neck: Normal range of motion. Neck supple.  Cardiovascular: Normal rate, regular rhythm and normal heart sounds.   Pulses:      Femoral pulses are 2+ on the right side, and 2+ on the left side.      Dorsalis pedis pulses are 2+ on the right side, and 2+ on the left side.       Delayed capillary refill bilaterally  Pulmonary/Chest: Effort  normal and breath sounds normal. No respiratory distress. He has no wheezes. He has no rales.  Abdominal: Soft.  Neurological: He is alert. No sensory deficit.  Skin: He is not diaphoretic.          Significant lower extremity edema and erythema.  Some desquamation of the skin.  Several bullae present on the skin. Lower extremity cold to touch bilaterally.  Psychiatric: He has a normal mood and affect.    ED Course  Procedures (including critical care time)   Labs Reviewed  CBC  DIFFERENTIAL  COMPREHENSIVE METABOLIC PANEL  URINALYSIS, ROUTINE W REFLEX MICROSCOPIC  CARDIAC PANEL(CRET KIN+CKTOT+MB+TROPI)   No results found.   No diagnosis found. 5:35 PM Discussed with [facility].  He reports that he will admit patient.  He is requesting a CXR.  5:41 PM Discussed with Dr. August Saucer with Orthopedics.  He reports that he will come see the patient shortly. MDM  Patient presenting today with significant lower extremity edema, desquamation of the skin, and erythema of the right leg after being wedged between two parts of his recliner for approximately 45 hours.  Labs done in the ED demonstrate Rhabdomyolysis.  Lower extremity ultrasound negative for DVT.  Patient does have 2+ DP pulse on the right.  However, Dr. August Saucer with Orthopedics will come evaluate patient further for possible compartment syndrome.  Patient started on IVF and will be admitted to Hospitalist service for further management.         Pascal Lux [place], PA-C 04/17/12 1450

## 2012-04-14 NOTE — ED Notes (Signed)
IV team at bedside 

## 2012-04-14 NOTE — ED Notes (Signed)
Paged and spoke with IV team and they are on way to see patient.

## 2012-04-14 NOTE — H&P (Signed)
PCP:   [name], MD, MD   Chief Complaint:  Painful and swollen leg after being trapped in recliner chair since Friday evening.  HPI: Mr Spooner had his right leg trapped in a recliner chair at home since Friday, around 6 pm. The leg somehow got trapped and he could not extricate himself. At the same time, he says he could not get to the phone, although it was within arm's reach. 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 He is unsure why he just could not get to the phone to call for help. His family found him today, and released him from the recliner. He had not eaten anything or taken any medications till presenting to the ED today. His leg is swollen, weeping and painful. He denies fever or sweats. Labs in ED showed [place] wbc of [address], with 88% neutrophils, cpk 7500, bun/cr 30/0.84, Bs 342, alkaline phosphatase 152, ast 79. He was started on ivf and referred for admission.  Review of Systems:  Unremarkable except as highlighted in the hpi.  Past Medical History: Past Medical History  Diagnosis Date  . Colon polyp     Tubular Adenoma  . Diabetes mellitus 2001  . Hypertension 1999  . Arthritis   . Hyperlipidemia 1999  . Epilepsy   . Sleep apnea    Past Surgical History  Procedure Date  . Knee surgery     Left  . Cholecystectomy   . Hernia repair 06/27/11    [facility]    Medications: Prior to Admission medications   Medication Sig Start Date End Date Taking? Authorizing Provider  amLODipine (NORVASC) 5 MG tablet Take 5 mg by mouth daily.   Yes Historical Provider, MD  aspirin EC 81 MG tablet Take 81 mg by mouth daily.   Yes Historical Provider, MD  benazepril (LOTENSIN) 40 MG tablet Take 40 mg by mouth daily.     Yes Historical Provider, MD  cholecalciferol (VITAMIN D) 1000 UNITS tablet Take 1,000 Units by mouth daily.     Yes Historical Provider, MD  Fish Oil OIL Take 1 tablet by mouth  daily.     Yes Historical Provider, MD  glyBURIDE-metformin (GLUCOVANCE) 2.5-500 MG per tablet Take 1 tablet by mouth 2 (two) times daily with a meal.   Yes Historical Provider, MD  levETIRAcetam (KEPPRA) 500 MG tablet Take 500 mg by mouth 2 (two) times daily.     Yes Historical Provider, MD  niacin 250 MG tablet Take 250 mg by mouth daily.     Yes Historical Provider, MD  phenytoin (DILANTIN) 100 MG ER capsule Take 300 mg by mouth at bedtime.    Yes Historical Provider, MD  potassium chloride SA (K-DUR,KLOR-CON) 20 MEQ tablet Take 20 mEq by mouth daily.    Yes Historical Provider, MD    Allergies:  No Known Allergies  Social History:  reports that he has quit smoking. His smoking use included Cigars. He does not have any smokeless tobacco history on file. He reports that he does not drink alcohol. His drug history not on file. lives alone.  Family History: Family History  Problem Relation Age of Onset  . Breast cancer Sister   . Colon cancer    . Diabetes Sister     Physical Exam: Filed Vitals:   04/14/12 1532 04/14/12 1701 04/14/12 1752  BP: 180/99 158/87 152/82  Temp: 98.2  F (36.8 C)    TempSrc: Oral    Resp: 18 22 22   SpO2: 100% 99% 100%   Lethargic but not in distress. Surrounded by family members at bed side. Dry oral mucosa. PERRLA. No jvd. No carotid bruits. Good air entry bilaterally. No rhonchi or rales S1S2 heard. No murmurs. RRR. Abdomen- soft, non tender. +BS. CNS- grossly Intact. Extremities- Right leg swollen with bullous lesions, as well as weeping, and inflamed aupto upper thigh where there is a demarcation from the trapping. Good peripheral pulses bilaterally.   Labs on Admission:   [facility] 04/14/12 1603  NA 141  K 3.4*  CL 96  CO2 28  GLUCOSE 342*  BUN 30*  CREATININE 0.84  CALCIUM 10.2  MG --  PHOS --    Basename 04/14/12 1603  AST 79*  ALT 32  ALKPHOS 152*  BILITOT 1.1  PROT 8.7*  ALBUMIN 4.3   No results found for this basename:  LIPASE:2,AMYLASE:2 in the last 72 hours  Basename 04/14/12 1603  WBC 22.9*  NEUTROABS 20.1*  HGB 17.5*  HCT 48.3  MCV 87.5  PLT 258    Basename 04/14/12 1603  CKTOTAL 7500*  CKMB 68.9*  CKMBINDEX --  TROPONINI <0.30   No results found for this basename: TSH,T4TOTAL,FREET3,T3FREE,THYROIDAB in the last 72 hours No results found for this basename: VITAMINB12:2,FOLATE:2,FERRITIN:2,TIBC:2,IRON:2,RETICCTPCT:2 in the last 72 hours  Radiological Exams on Admission: Dg Chest 2 View  04/14/2012  *RADIOLOGY REPORT*  Clinical Data: Chest pain.  History of diabetes.  CHEST - 2 VIEW  Comparison: Chest x-ray 07/17/2011.  Findings: Lung volumes are normal.  No consolidative airspace disease.  No pleural effusions.  No pneumothorax.  No pulmonary nodule or mass noted.  Pulmonary vasculature and the cardiomediastinal silhouette are within normal limits.  Lateral view and demonstrates a potential compression fracture in the mid lumbar spine which is incompletely visualized.  IMPRESSION: 1. No radiographic evidence of acute cardiopulmonary disease. 2.  Potential compression fracture the mid lumbar spine incompletely visualized. If in fact this is real, this would be new compared to prior CT of abdomen and pelvis dated 05/09/2011. Clinical correlation for point tenderness in this region is recommended.  This could be better evaluated with dedicated lumbar spine radiographs if clinically indicated.  Original Report Authenticated By: Florencia Reasons, M.D.    Assessment  66 year old male with rhabdomyolysis, and cellulitis of right leg after getting leg trapped in recliner for more than 36 hours. 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 Patient also has non ketotic hyperglycemia.  Plan   .Cellulitis of leg, right- will ask surgery to see. Dr Luisa Hart will graciously see patient later today(?do we need to worry about managing like burns, given degree of skin involvement?).  Meanwhile, will admit med/surg, rehydrate, give analgesics as necessary wound culture, cover broadly with vanc/zosyn/levaquin, deescalate abx as more information available. This gentleman needs aggressive and meticulous management of the leg, given he is diabetic, and is at high risk for resistant nosocomial infections as he works in the health care setting. .Rhabdomyolysis- ivf, trend cpk/k/mg/phosphorus/rrenal function. .Secondary diabetes with hyperglycemia hyperosmolar non-ketotic coma- hold oral hypoglycemics. Lantus/aspart, ivf. Check Hb A1c. .Leukocytosis- likely due to cellulitis. Follow septic work up, including UC/BC/cxr. Marland KitchenHTN (hypertension)- resume norvasc. Hold acei till renal function more stable. Marland KitchenAKI (acute kidney injury)- due to rhabdomyolysis and dehydration Dvt/gi prophylaxis.  Condition very closely guarded. Discussed plan of care with sister at bed side.  Apostolos Blagg [phone]. 04/14/2012, 6:49 PM

## 2012-04-14 NOTE — ED Notes (Signed)
Printed two old ekgs

## 2012-04-14 NOTE — ED Notes (Signed)
Patient transported to X-ray 

## 2012-04-14 NOTE — ED Notes (Signed)
EMS reports pt from home, tripped over CPAP machine and wedged leg, stuck since Friday night at 6pm, cold, edema to the right leg where it was wedged

## 2012-04-15 DIAGNOSIS — E1165 Type 2 diabetes mellitus with hyperglycemia: Secondary | ICD-10-CM | POA: Diagnosis not present

## 2012-04-15 DIAGNOSIS — L02619 Cutaneous abscess of unspecified foot: Secondary | ICD-10-CM

## 2012-04-15 DIAGNOSIS — I10 Essential (primary) hypertension: Secondary | ICD-10-CM

## 2012-04-15 DIAGNOSIS — E118 Type 2 diabetes mellitus with unspecified complications: Secondary | ICD-10-CM

## 2012-04-15 DIAGNOSIS — L03039 Cellulitis of unspecified toe: Secondary | ICD-10-CM

## 2012-04-15 DIAGNOSIS — M6282 Rhabdomyolysis: Secondary | ICD-10-CM | POA: Diagnosis not present

## 2012-04-15 DIAGNOSIS — E860 Dehydration: Secondary | ICD-10-CM | POA: Diagnosis present

## 2012-04-15 LAB — URINE MICROSCOPIC-ADD ON

## 2012-04-15 LAB — COMPREHENSIVE METABOLIC PANEL
Albumin: 2.8 g/dL — ABNORMAL LOW (ref 3.5–5.2)
BUN: 30 mg/dL — ABNORMAL HIGH (ref 6–23)
Chloride: 105 mEq/L (ref 96–112)
Creatinine, Ser: 0.86 mg/dL (ref 0.50–1.35)
GFR calc Af Amer: >90 mL/min (ref >90)
Glucose, Bld: 200 mg/dL — ABNORMAL HIGH (ref 70–99)
Total Bilirubin: 0.9 mg/dL (ref 0.3–1.2)
Total Protein: 6.2 g/dL (ref 6.0–8.3)

## 2012-04-15 LAB — DIFFERENTIAL
Lymphocytes Relative: 9 % — ABNORMAL LOW (ref 12–46)
Lymphs Abs: 1.3 10*3/uL (ref 0.7–4.0)
Neutro Abs: 12.3 10*3/uL — ABNORMAL HIGH (ref 1.7–7.7)
Neutrophils Relative %: 82 % — ABNORMAL HIGH (ref 43–77)

## 2012-04-15 LAB — URINALYSIS, ROUTINE W REFLEX MICROSCOPIC
Ketones, ur: 15 mg/dL — AB
Nitrite: NEGATIVE
Protein, ur: 100 mg/dL — AB
Urobilinogen, UA: 1 mg/dL (ref 0.0–1.0)

## 2012-04-15 LAB — LIPID PANEL
Cholesterol: 156 mg/dL (ref 0–200)
LDL Cholesterol: 79 mg/dL (ref 0–99)
Total CHOL/HDL Ratio: 3.5 RATIO
VLDL: 32 mg/dL (ref 0–40)

## 2012-04-15 LAB — CARDIAC PANEL(CRET KIN+CKTOT+MB+TROPI)
CK, MB: 19.5 ng/mL (ref 0.3–4.0)
Relative Index: 0.4 (ref 0.0–2.5)
Total CK: 4172 U/L — ABNORMAL HIGH (ref 7–232)
Total CK: 3629 U/L — ABNORMAL HIGH (ref 7–232)

## 2012-04-15 LAB — CBC
Platelets: 204 10*3/uL (ref 150–400)
RBC: 4.48 MIL/uL (ref 4.22–5.81)
WBC: 15.1 10*3/uL — ABNORMAL HIGH (ref 4.0–10.5)

## 2012-04-15 LAB — GLUCOSE, CAPILLARY
Glucose-Capillary: 210 mg/dL — ABNORMAL HIGH (ref 70–99)
Glucose-Capillary: 178 mg/dL — ABNORMAL HIGH (ref 70–99)
Glucose-Capillary: 165 mg/dL — ABNORMAL HIGH (ref 70–99)
Glucose-Capillary: 140 mg/dL — ABNORMAL HIGH (ref 70–99)

## 2012-04-15 LAB — HEMOGLOBIN A1C
Hgb A1c MFr Bld: 6.9 % — ABNORMAL HIGH (ref <5.7)
Mean Plasma Glucose: 151 mg/dL — ABNORMAL HIGH (ref <117)

## 2012-04-15 LAB — APTT: aPTT: 43 seconds — ABNORMAL HIGH (ref 24–37)

## 2012-04-15 LAB — PROTIME-INR
INR: 1.25 (ref 0.00–1.49)
Prothrombin Time: 16 seconds — ABNORMAL HIGH (ref 11.6–15.2)

## 2012-04-15 LAB — MRSA PCR SCREENING: MRSA by PCR: NEGATIVE

## 2012-04-15 MED ORDER — MUPIROCIN 2 % EX OINT
TOPICAL_OINTMENT | Freq: Two times a day (BID) | CUTANEOUS | Status: DC
  Administered 2012-04-15 – 2012-04-18 (×6): via TOPICAL
  Filled 2012-04-15 (×3): qty 22

## 2012-04-15 MED ORDER — OXYCODONE HCL 5 MG PO TABS
5.0000 mg | ORAL_TABLET | ORAL | Status: DC | PRN
  Administered 2012-04-16 (×2): 5 mg via ORAL
  Administered 2012-04-17 – 2012-04-18 (×5): 10 mg via ORAL
  Filled 2012-04-15 (×2): qty 2
  Filled 2012-04-15: qty 1
  Filled 2012-04-15: qty 2
  Filled 2012-04-15: qty 1
  Filled 2012-04-15 (×2): qty 2

## 2012-04-15 MED ORDER — INSULIN GLARGINE 100 UNIT/ML ~~LOC~~ SOLN
14.0000 [IU] | Freq: Every day | SUBCUTANEOUS | Status: DC
  Administered 2012-04-15 – 2012-04-17 (×3): 14 [IU] via SUBCUTANEOUS

## 2012-04-15 MED ORDER — INSULIN ASPART 100 UNIT/ML ~~LOC~~ SOLN
6.0000 [IU] | Freq: Three times a day (TID) | SUBCUTANEOUS | Status: DC
  Administered 2012-04-16 – 2012-04-18 (×7): 6 [IU] via SUBCUTANEOUS

## 2012-04-15 MED ORDER — INSULIN GLARGINE 100 UNIT/ML ~~LOC~~ SOLN: 20.0000 [IU] | Freq: Every day | SUBCUTANEOUS | Status: DC

## 2012-04-15 NOTE — Consult Note (Signed)
WOC consult Note Reason for Consult: blisters/lesions to RLE s/p fall with entrapment x2days Wound type: multiple, diffuse partial thickness trauma and blistering Measurement: diffuse areas of blisters both intact and unroofed, with linear trauma nearest to groin Wound bed: all areas do not look suspicious of infection, in various stages of healing Drainage (amount, consistency, odor): serous, no odor  Periwound: intact Dressing procedure/placement/frequency Bactroban to R groin linear wound; all areas covered with petroleum gauze, wrapped in kerlex & secured with ACE  Re consult if needed, will not follow at this time. Thanks  Arthur Mixer, RN BSN Wound care student [employer] [facility] [place], [place] [phone]

## 2012-04-15 NOTE — Progress Notes (Addendum)
Right leg improved but still weak Knee rom ok Foot perfused and mobile Mobilize with PT - cpm and scd when in bed CK decreased today

## 2012-04-15 NOTE — Evaluation (Signed)
Physical Therapy Evaluation Patient Details Name: Arthur Berry MRN: [id number] DOB: 10-18-1946 Today's Date: 04/15/2012 Time: 5784-6962 PT Time Calculation (min): 43 min  PT Assessment / Plan / Recommendation Clinical Impression  pt admitted with R leg injury/ cellulitis from being closed up in a recliner for a prolonged period.  Pt is weak in UE's and R LE, but surprisingly was able to mobilize on his feet with RW.  Bed Mobility was difficult.  Pt should make good progress and either go to a families home temporarily with HHPT or needs to go to [facility] for rehab.    PT Assessment  Patient needs continued PT services    Follow Up Recommendations  Inpatient Rehab;Other (comment);Home health PT (or home with his siblings and HHPT)    Barriers to Discharge Decreased caregiver support;Other (comment) (at home)      lEquipment Recommendations  Defer to next venue    Recommendations for Other Services Rehab consult   Frequency Min 3X/week    Precautions / Restrictions Precautions Precautions: Fall Restrictions Weight Bearing Restrictions: No   Pertinent Vitals/Pain       Mobility  Bed Mobility Bed Mobility: Supine to Sit;Sitting - Scoot to Edge of Bed Supine to Sit: 3: Mod assist;HOB flat Sitting - Scoot to Edge of Bed: 5: Supervision Details for Bed Mobility Assistance: vc's for hand placement; truncal assist to come forward Transfers Transfers: Sit to Stand;Stand to Sit Sit to Stand: 4: Min assist;With upper extremity assist;From bed Stand to Sit: 4: Min guard;With upper extremity assist;To chair/3-in-1 Details for Transfer Assistance: vc's  for hand placement up and sitting; minor lifting assist standing Ambulation/Gait Ambulation/Gait Assistance: 4: Min assist (initially then min guard once underway) Ambulation Distance (Feet): 130 Feet Assistive device: Rolling walker Ambulation/Gait Assistance Details: vc's for sequencing to decr pain and better use of the RW.; assist  to help control gait speed and RW Gait Pattern: Step-through pattern;Decreased step length - right;Decreased step length - left;Decreased stride length;Antalgic Stairs: No Wheelchair Mobility Wheelchair Mobility: No    Exercises     PT Diagnosis: Difficulty walking;Generalized weakness;Acute pain  PT Problem List: Decreased strength;Decreased range of motion;Decreased activity tolerance;Decreased mobility;Decreased knowledge of use of DME;Pain PT Treatment Interventions: DME instruction;Gait training;Stair training;Functional mobility training;Therapeutic activities;Patient/family education   PT Goals Acute Rehab PT Goals PT Goal Formulation: With patient Time For Goal Achievement: 04/15/12 Potential to Achieve Goals: Good Pt will go Supine/Side to Sit: with supervision;with HOB 0 degrees PT Goal: Supine/Side to Sit - Progress: Goal set today Pt will go Sit to Stand: with supervision PT Goal: Sit to Stand - Progress: Goal set today Pt will Transfer Bed to Chair/Chair to Bed: with supervision PT Transfer Goal: Bed to Chair/Chair to Bed - Progress: Goal set today Pt will Ambulate: >150 feet;with supervision;with least restrictive assistive device PT Goal: Ambulate - Progress: Goal set today Pt will Go Up / Down Stairs: 1-2 stairs;with supervision;with rail(s);Other (comment) (if to go directly home) PT Goal: Up/Down Stairs - Progress: Goal set today  Visit Information  Last PT Received On: 04/15/12 Assistance Needed: +1    Subjective Data  Subjective: By accident I entered the recliner head first and got my leg caught to the leg rest bar. Patient Stated Goal: Home I   Prior Functioning  Home Living Lives With: Alone Available Help at Discharge: Other (Comment) (may be able to go to a siblings home temporarily) Type of Home: House Home Access: Stairs to enter [employer] of Steps:  3 Entrance Stairs-Rails: Right;Left Home Layout: One level;Able to live on main level  with bedroom/bathroom Bathroom Shower/Tub: Tub/shower unit;[employer]: Standard Home Adaptive Equipment: Straight cane Prior Function Level of Independence: Independent Able to Take Stairs?: Yes Driving: Yes Vocation: Full time employment Communication Communication: No difficulties    Cognition  Overall Cognitive Status: Appears within functional limits for tasks assessed/performed Arousal/Alertness: Awake/alert Orientation Level: Appears intact for tasks assessed Behavior During Session: [facility] for tasks performed    Extremity/Trunk Assessment Right Upper Extremity Assessment RUE ROM/Strength/Tone: Deficits RUE ROM/Strength/Tone Deficits: Grossly weak bil at >=3+/5 and painful Left Upper Extremity Assessment LUE ROM/Strength/Tone: Deficits LUE ROM/Strength/Tone Deficits: weaker than right at 3+/5 Right Lower Extremity Assessment RLE ROM/Strength/Tone: Deficits RLE ROM/Strength/Tone Deficits: grossly 3/5 flexion and >3+/5 extension Left Lower Extremity Assessment LLE ROM/Strength/Tone: Within functional levels Trunk Assessment Trunk Assessment: Normal   Balance Balance Balance Assessed: Yes Static Sitting Balance Static Sitting - Balance Support: Feet supported;No upper extremity supported Static Sitting - Level of Assistance: 6: Modified independent (Device/Increase time) Static Sitting - Comment/# of Minutes: 7 Static Standing Balance Static Standing - Balance Support: Bilateral upper extremity supported;During functional activity Static Standing - Level of Assistance: 5: Stand by assistance  End of Session PT - End of Session Activity Tolerance: Patient tolerated treatment well Patient left: in chair;with call bell/phone within reach Nurse Communication: Mobility status CPM Right Knee CPM Right Knee: Off   Koriana Stepien, Eliseo Gum 04/15/2012, 2:03 PM  04/15/2012  [place] Bing, PT [phone] (pager)

## 2012-04-15 NOTE — Progress Notes (Signed)
[facility] [place] TEAM 1 - Stepdown/ICU TEAM  Subjective: Alert and denies significant pain in the right lower extremity but currently is not weight bearing on this leg. No other complaints verbalized.  Objective: Blood pressure 151/72, pulse 98, temperature 98.9 F (37.2 C), temperature source Oral, resp. rate 20, height 5\' 10"  (1.778 m), weight 90.1 kg (198 lb 10.2 oz), SpO2 97.00%.  Intake/Output from previous day: 06/02 0701 - 06/03 0700 In: 1237.9 [P.O.:240; I.V.:997.9] Out: 525 [Urine:525] Intake/Output this shift: Total I/O In: 125 [P.O.:125] Out: -   General appearance: alert, cooperative, appears stated age and no distress Resp: clear to auscultation bilaterally Cardio: regular rate and rhythm, S1, S2 normal, no murmur, click, rub or gallop GI: soft, non-tender; bowel sounds normal; no masses,  no organomegaly Extremities: Right lower extremity markedly swollen and covered with a dressing but pulses intact and foot is warm, noted with 2+ edema right foot, left lower extremity unremarkable Neurologic: Grossly normal  Lab Results:  Basename 04/15/12 0430 04/14/12 1603  WBC 15.1* 22.9*  HGB 14.1 17.5*  HCT 39.4 48.3  PLT 204 258   BMET  Basename 04/15/12 0830 04/14/12 2218 04/14/12 1603  NA 143 -- 141  K 3.6 -- 3.4*  CL 105 -- 96  CO2 27 -- 28  GLUCOSE 200* -- 342*  BUN 30* -- 30*  CREATININE 0.86 0.83 --  CALCIUM 8.8 -- 10.2    Studies/Results: Dg Chest 2 View  04/14/2012  *RADIOLOGY REPORT*  Clinical Data: Chest pain.  History of diabetes.  CHEST - 2 VIEW  Comparison: Chest x-ray 07/17/2011.  Findings: Lung volumes are normal.  No consolidative airspace disease.  No pleural effusions.  No pneumothorax.  No pulmonary nodule or mass noted.  Pulmonary vasculature and the cardiomediastinal silhouette are within normal limits.  Lateral view and demonstrates a potential compression fracture in the mid lumbar spine which is incompletely visualized.   IMPRESSION: 1. No radiographic evidence of acute cardiopulmonary disease. 2.  Potential compression fracture the mid lumbar spine incompletely visualized. If in fact this is real, this would be new compared to prior CT of abdomen and pelvis dated 05/09/2011. Clinical correlation for point tenderness in this region is recommended.  This could be better evaluated with dedicated lumbar spine radiographs if clinically indicated.  Original Report Authenticated By: Florencia Reasons, M.D.   Dg Femur Right  04/14/2012  *RADIOLOGY REPORT*  Clinical Data: Larey Seat.  Right hip pain.  RIGHT FEMUR - 2 VIEW  Comparison: None  Findings: The hip joint is maintained.  There are mild degenerative changes.  No acute fracture.  The knee joint demonstrates moderate degenerative changes.  No femur fracture.  IMPRESSION: No acute femur fracture.  Original Report Authenticated By: P. Loralie Champagne, M.D.   Dg Tibia/fibula Right  04/14/2012  *RADIOLOGY REPORT*  Clinical Data: Larey Seat.  Right leg pain.  RIGHT TIBIA AND FIBULA - 2 VIEW  Comparison: None  Findings: The knee and ankle joints are maintained.  Moderate degenerative changes involving the knee and ankle.  No fracture of the tibia or fibula is identified.  Probable remote trauma involving the ankle.  IMPRESSION: No acute bony findings.  Original Report Authenticated By: P. Loralie Champagne, M.D.   Dg Knee Complete 4 Views Right  04/14/2012  *RADIOLOGY REPORT*  Clinical Data: Larey Seat.  RIGHT KNEE - COMPLETE 4+ VIEW  Comparison: None.  Findings: There are moderate tricompartmental degenerative changes but no acute fracture.  Possible small joint effusion.  Multiple skin  lesions could be due to neuro fibromatosis.  IMPRESSION:  1.  No acute fracture. 2.  Moderate degenerative changes. 3.  Possible small joint effusion.  Original Report Authenticated By: P. Loralie Champagne, M.D.   Medications: I have reviewed the patient's current medications.  Assessment/Plan:  Rhabdomyolysis with  entrapment/stasis injury of R LE *CPK 7500 at presentation and has now trended down to 4100 *Continue IV fluid rehydration *Creatinine stable *Followup on lower extremity Doppler studies *Appreciate wound ostomy nurse assistance *Felt to have diffuse partial thickness related traumatic wounds sustained after fall and entrapment in recliner chair *Continue empiric broad-spectrum antibiotic coverage since is diabetic with Vancomycin and Levaquin *Appreciate orthopedics as well as general surgery consultations *No evidence of compartment syndrome and no indications for surgical intervention at this time  AKI (acute kidney injury) / Dehydration *Precipitated by dehydration and associated dysmobility after fall with entrapment  *Creatinine stable  Leukocytosis *Secondary to injury of right lower extremity as well as dehydration and acute kidney injury *Trend is downward  Hypophosphatemia *Repeat lab in am - likely related to acute dehydration and volume shifting  DIABETES MELLITUS-TYPE II *CBG elevated greater than 300 initially and now trended downward to 200 *adjust Lantus and sliding scale insulin  HTN (hypertension) *Continue Norvasc and metoprolol  Seizure disorder *Continue Keppra and Dilantin  Disposition *Transfer to 5000 - ? CIR after acute stay complete   LOS: 1 day   Junious Silk, ANP pager [phone]  [facility] [email or web address] Password: [email or web address]  04/15/2012, 1:00 PM  I have personally examined this patient and reviewed the entire database. I have reviewed the above note, made any necessary editorial changes, and agree with its content.  Lonia Blood, MD [facility]

## 2012-04-15 NOTE — Progress Notes (Signed)
Pt. Being trsferred to 5041 via wheelchair. Phone report called to Lauren, rn. Patient aware of the transfer.

## 2012-04-15 NOTE — Progress Notes (Signed)
Subjective: Alert male status normal. Comfortable. Currently up in chair and right lower extremity is wrapped. Since he is not having any pain in the leg and has normal sensation in his foot. He was able to angulate with PT.  Dopplers have been ordered to rule out DVT but they have not been done yet.  He is being followed by Dr. Dorene Grebe, as well    Objective: Vital signs in last 24 hours: Temp:  [97.6 F (36.4 C)-98.8 F (37.1 C)] 97.6 F (36.4 C) (06/03 1109) Pulse Rate:  [91-108] 102  (06/03 0345) Resp:  [18-23] 23  (06/03 0345) BP: (132-180)/(69-99) 148/77 mmHg (06/03 0700) SpO2:  [96 %-100 %] 96 % (06/03 0345) Weight:  [198 lb 10.2 oz (90.1 kg)-198 lb 13.7 oz (90.2 kg)] 198 lb 10.2 oz (90.1 kg) (06/03 0500) Last BM Date: 04/11/12  Intake/Output from previous day: 06/02 0701 - 06/03 0700 In: 1237.9 [P.O.:240; I.V.:997.9] Out: 525 [Urine:525] Intake/Output this shift: Total I/O In: 125 [P.O.:125] Out: -   General appearance: alert. Comfortable. Middle status normal. In no distress. Extremities: reasonable range of motion right hip right knee and right ankle. No neurovascular compromise foot. Palpable right dorsalis pedis pulse. Calf and thigh compartments soft and nontender. Superficial blistering. Wound care is underway.  Lab Results:   Basename 04/15/12 0430 04/14/12 1603  WBC 15.1* 22.9*  HGB 14.1 17.5*  HCT 39.4 48.3  PLT 204 258   BMET  Basename 04/15/12 0830 04/14/12 2218 04/14/12 1603  NA 143 -- 141  K 3.6 -- 3.4*  CL 105 -- 96  CO2 27 -- 28  GLUCOSE 200* -- 342*  BUN 30* -- 30*  CREATININE 0.86 0.83 --  CALCIUM 8.8 -- 10.2   PT/INR  Basename 04/15/12 0430  LABPROT 16.0*  INR 1.25   ABG No results found for this basename: PHART:2,PCO2:2,PO2:2,HCO3:2 in the last 72 hours  Studies/Results: Dg Chest 2 View  04/14/2012  *RADIOLOGY REPORT*  Clinical Data: Chest pain.  History of diabetes.  CHEST - 2 VIEW  Comparison: Chest x-ray 07/17/2011.   Findings: Lung volumes are normal.  No consolidative airspace disease.  No pleural effusions.  No pneumothorax.  No pulmonary nodule or mass noted.  Pulmonary vasculature and the cardiomediastinal silhouette are within normal limits.  Lateral view and demonstrates a potential compression fracture in the mid lumbar spine which is incompletely visualized.  IMPRESSION: 1. No radiographic evidence of acute cardiopulmonary disease. 2.  Potential compression fracture the mid lumbar spine incompletely visualized. If in fact this is real, this would be new compared to prior CT of abdomen and pelvis dated 05/09/2011. Clinical correlation for point tenderness in this region is recommended.  This could be better evaluated with dedicated lumbar spine radiographs if clinically indicated.  Original Report Authenticated By: Florencia Reasons, M.D.   Dg Femur Right  04/14/2012  *RADIOLOGY REPORT*  Clinical Data: Larey Seat.  Right hip pain.  RIGHT FEMUR - 2 VIEW  Comparison: None  Findings: The hip joint is maintained.  There are mild degenerative changes.  No acute fracture.  The knee joint demonstrates moderate degenerative changes.  No femur fracture.  IMPRESSION: No acute femur fracture.  Original Report Authenticated By: P. Loralie Champagne, M.D.   Dg Tibia/fibula Right  04/14/2012  *RADIOLOGY REPORT*  Clinical Data: Larey Seat.  Right leg pain.  RIGHT TIBIA AND FIBULA - 2 VIEW  Comparison: None  Findings: The knee and ankle joints are maintained.  Moderate degenerative changes involving the knee  and ankle.  No fracture of the tibia or fibula is identified.  Probable remote trauma involving the ankle.  IMPRESSION: No acute bony findings.  Original Report Authenticated By: P. Loralie Champagne, M.D.   Dg Knee Complete 4 Views Right  04/14/2012  *RADIOLOGY REPORT*  Clinical Data: Larey Seat.  RIGHT KNEE - COMPLETE 4+ VIEW  Comparison: None.  Findings: There are moderate tricompartmental degenerative changes but no acute fracture.  Possible  small joint effusion.  Multiple skin lesions could be due to neuro fibromatosis.  IMPRESSION:  1.  No acute fracture. 2.  Moderate degenerative changes. 3.  Possible small joint effusion.  Original Report Authenticated By: P. Loralie Champagne, M.D.    Anti-infectives: Anti-infectives     Start     Dose/Rate Route Frequency Ordered Stop   04/15/12 2000   levofloxacin (LEVAQUIN) IVPB 750 mg        750 mg 100 mL/hr over 90 Minutes Intravenous Every 24 hours 04/14/12 1849     04/15/12 0800   vancomycin (VANCOCIN) 1,250 mg in sodium chloride 0.9 % 250 mL IVPB        1,250 mg 166.7 mL/hr over 90 Minutes Intravenous Every 12 hours 04/14/12 1849     04/15/12 0200  piperacillin-tazobactam (ZOSYN) IVPB 3.375 g       3.375 g 12.5 mL/hr over 240 Minutes Intravenous Every 8 hours 04/14/12 1849     04/14/12 1900   vancomycin (VANCOCIN) 1,250 mg in sodium chloride 0.9 % 250 mL IVPB        1,250 mg 166.7 mL/hr over 90 Minutes Intravenous To [facility] 04/14/12 1849 04/14/12 2310   04/14/12 1900  piperacillin-tazobactam (ZOSYN) IVPB 3.375 g       3.375 g 100 mL/hr over 30 Minutes Intravenous To [facility] 04/14/12 1849 04/14/12 2129   04/14/12 1900   levofloxacin (LEVAQUIN) IVPB 750 mg        750 mg 100 mL/hr over 90 Minutes Intravenous To [facility] 04/14/12 1849 04/14/12 2057          Assessment/Plan:  Right lower extremity crush type injury. No evidence of compartment syndrome. Elevated CK noted, normal creatinine.  No indication for debridement of superficial blistering. No evidence of deep space infection. Agree with watching for rhabdomyolysis, but doubt.  Needs duplex scanning to rule out DVT and that is going to be done today. On a low dose Lovenox currently.     LOS: 1 day    Diamon Reddinger M. Derrell Lolling, M.D., [facility] General and Minimally invasive Surgery Breast and Colorectal Surgery Office:   [phone] Pager:    [phone]  04/15/2012

## 2012-04-15 NOTE — Progress Notes (Signed)
Utilization review completed.  

## 2012-04-15 NOTE — Consult Note (Signed)
Physical Medicine and Rehabilitation Consult Reason for Consult: LLE crush injury Referring Physician:  Dr. Sharon Seller   HPI: Arthur Berry is a 66 y.o. male with history of DM, HTN, admitted 04/14/12 after being found by family with RLE trapped in a recliner since 05/31 pm. Apparently, he tripped over his CPAP and fell into his recliner where he was wedged between the seat and the leg rest. Upon admission, the patient noted to have painful, weeping, swollen leg with WBC 22000 with dehydration BUN-30 and CK 7500. Evaluated by Dr. Luisa Hart who recommended checking dopplers to r/o DVT and watching for rhabdomyolysis.  Evaluated by Dr. August Saucer who felt that no signs of compartment syndrome evident and recommended compressive wraps for edema and CPM to help with ROM. Xrays of femur, Knee and tib-fib negative for fracture, moderate degenerative changes in L-knee and ankle.  PT evaluation done today. PT, MD recommending CIR.   ROS Past Medical History  Diagnosis Date  . Colon polyp     Tubular Adenoma  . Diabetes mellitus 2001  . Hypertension 1999  . Arthritis   . Hyperlipidemia 1999  . Epilepsy   . Sleep apnea   . Angina   . Shortness of breath   . Chronic kidney disease   . Neuromuscular disorder    Past Surgical History  Procedure Date  . Knee surgery     Left  . Cholecystectomy   . Hernia repair 06/27/11    [facility]   Family History  Problem Relation Age of Onset  . Breast cancer Sister   . Colon cancer    . Diabetes Sister    Social History:  Lives alone. Works for [employer].  He  reports that he quit smoking about 26 years ago. His smoking use included Cigars. He does not have any smokeless tobacco history on file. He reports that he does not drink alcohol. His drug history not on file.  Allergies: No Known Allergies  Medications Prior to Admission  Medication Sig Dispense Refill  . amLODipine (NORVASC) 5 MG tablet Take 5 mg by mouth daily.      Marland Kitchen aspirin EC 81 MG tablet  Take 81 mg by mouth daily.      . benazepril (LOTENSIN) 40 MG tablet Take 40 mg by mouth daily.        . cholecalciferol (VITAMIN D) 1000 UNITS tablet Take 1,000 Units by mouth daily.        . Fish Oil OIL Take 1 tablet by mouth daily.        Marland Kitchen glyBURIDE-metformin (GLUCOVANCE) 2.5-500 MG per tablet Take 1 tablet by mouth 2 (two) times daily with a meal.      . levETIRAcetam (KEPPRA) 500 MG tablet Take 500 mg by mouth 2 (two) times daily.        . niacin 250 MG tablet Take 250 mg by mouth daily.        . phenytoin (DILANTIN) 100 MG ER capsule Take 300 mg by mouth at bedtime.       . potassium chloride SA (K-DUR,KLOR-CON) 20 MEQ tablet Take 20 mEq by mouth daily.         Home: Home Living Lives With: Alone Available Help at Discharge: Other (Comment) (may be able to go to a siblings home temporarily) Type of Home: House Home Access: Stairs to enter [employer] of Steps: 3 Entrance Stairs-Rails: Right;Left Home Layout: One level;Able to live on main level with bedroom/bathroom Bathroom Shower/Tub: Tub/shower unit;Curtain Bathroom  Toilet: Standard Home Adaptive Equipment: Straight cane  Functional History: Prior Function Able to Take Stairs?: Yes Driving: Yes Vocation: Full time employment Functional Status:  Mobility: Bed Mobility Bed Mobility: Supine to Sit;Sitting - Scoot to Edge of Bed Supine to Sit: 3: Mod assist;HOB flat Sitting - Scoot to Edge of Bed: 5: Supervision Transfers Transfers: Sit to Stand;Stand to Sit Sit to Stand: 4: Min assist;With upper extremity assist;From bed Stand to Sit: 4: Min guard;With upper extremity assist;To chair/3-in-1 Ambulation/Gait Ambulation/Gait Assistance: 4: Min assist (initially then min guard once underway) Ambulation Distance (Feet): 130 Feet Assistive device: Rolling walker Ambulation/Gait Assistance Details: vc's for sequencing to decr pain and better use of the RW.; assist to help control gait speed and RW Gait Pattern:  Step-through pattern;Decreased step length - right;Decreased step length - left;Decreased stride length;Antalgic Stairs: No Wheelchair Mobility Wheelchair Mobility: No  ADL:    Cognition: Cognition Arousal/Alertness: Awake/alert Orientation Level: Oriented X4 Cognition Overall Cognitive Status: Appears within functional limits for tasks assessed/performed Arousal/Alertness: Awake/alert Orientation Level: Appears intact for tasks assessed Behavior During Session: [facility] for tasks performed  Blood pressure 150/76, pulse 102, temperature 97.6 F (36.4 C), temperature source Oral, resp. rate 23, height 5\' 10"  (1.778 m), weight 90.1 kg (198 lb 10.2 oz), SpO2 96.00%. Physical Exam  Nursing note and vitals reviewed. Constitutional: He is oriented to person, place, and time.  Musculoskeletal:       Right leg with ACE wrap and appropriately tender. Mild edema seen. Left LE with a few bruises.  Neurological: He is alert and oriented to person, place, and time. No cranial nerve deficit or sensory deficit.       Able to move RLE with 1-2/5 strength (pain component also) LLE grossly 3-4/5  Psychiatric: He has a normal mood and affect. His behavior is normal. Judgment and thought content normal.    Results for orders placed during the hospital encounter of 04/14/12 (from the past 24 hour(s))  CBC     Status: Abnormal   Collection Time   04/14/12  4:03 PM      Component Value Range   WBC 22.9 (*) 4.0 - 10.5 (K/uL)   RBC 5.52  4.22 - 5.81 (MIL/uL)   Hemoglobin 17.5 (*) 13.0 - 17.0 (g/dL)   HCT 16.1  09.6 - 04.5 (%)   MCV 87.5  78.0 - 100.0 (fL)   MCH 31.7  26.0 - 34.0 (pg)   MCHC 36.2 (*) 30.0 - 36.0 (g/dL)   RDW 40.9  81.1 - 91.4 (%)   Platelets 258  150 - 400 (K/uL)  DIFFERENTIAL     Status: Abnormal   Collection Time   04/14/12  4:03 PM      Component Value Range   Neutrophils Relative 88 (*) 43 - 77 (%)   Neutro Abs 20.1 (*) 1.7 - 7.7 (K/uL)   Lymphocytes Relative 4 (*) 12 - 46 (%)    Lymphs Abs 1.0  0.7 - 4.0 (K/uL)   Monocytes Relative 8  3 - 12 (%)   Monocytes Absolute 1.7 (*) 0.1 - 1.0 (K/uL)   Eosinophils Relative 0  0 - 5 (%)   Eosinophils Absolute 0.0  0.0 - 0.7 (K/uL)   Basophils Relative 0  0 - 1 (%)   Basophils Absolute 0.0  0.0 - 0.1 (K/uL)  COMPREHENSIVE METABOLIC PANEL     Status: Abnormal   Collection Time   04/14/12  4:03 PM      Component Value Range  Sodium 141  135 - 145 (mEq/L)   Potassium 3.4 (*) 3.5 - 5.1 (mEq/L)   Chloride 96  96 - 112 (mEq/L)   CO2 28  19 - 32 (mEq/L)   Glucose, Bld 342 (*) 70 - 99 (mg/dL)   BUN 30 (*) 6 - 23 (mg/dL)   Creatinine, Ser 1.19  0.50 - 1.35 (mg/dL)   Calcium 14.7  8.4 - 10.5 (mg/dL)   Total Protein 8.7 (*) 6.0 - 8.3 (g/dL)   Albumin 4.3  3.5 - 5.2 (g/dL)   AST 79 (*) 0 - 37 (U/L)   ALT 32  0 - 53 (U/L)   Alkaline Phosphatase 152 (*) 39 - 117 (U/L)   Total Bilirubin 1.1  0.3 - 1.2 (mg/dL)   GFR calc non Af Amer 89 (*) >90 (mL/min)   GFR calc Af Amer >90  >90 (mL/min)  CARDIAC PANEL(CRET KIN+CKTOT+MB+TROPI)     Status: Abnormal   Collection Time   04/14/12  4:03 PM      Component Value Range   Total CK 7500 (*) 7 - 232 (U/L)   CK, MB 68.9 (*) 0.3 - 4.0 (ng/mL)   Troponin I <0.30  <0.30 (ng/mL)   Relative Index 0.9  0.0 - 2.5   GLUCOSE, CAPILLARY     Status: Abnormal   Collection Time   04/14/12  9:02 PM      Component Value Range   Glucose-Capillary 259 (*) 70 - 99 (mg/dL)  CREATININE, SERUM     Status: Abnormal   Collection Time   04/14/12 10:18 PM      Component Value Range   Creatinine, Ser 0.83  0.50 - 1.35 (mg/dL)   GFR calc non Af Amer 90 (*) >90 (mL/min)   GFR calc Af Amer >90  >90 (mL/min)  GLUCOSE, CAPILLARY     Status: Abnormal   Collection Time   04/14/12 10:22 PM      Component Value Range   Glucose-Capillary 252 (*) 70 - 99 (mg/dL)   Comment 1 Notify RN     Comment 2 Documented in Chart    MRSA PCR SCREENING     Status: Normal   Collection Time   04/14/12 11:16 PM      Component Value  Range   MRSA by PCR NEGATIVE  NEGATIVE   URINALYSIS, ROUTINE W REFLEX MICROSCOPIC     Status: Abnormal   Collection Time   04/14/12 11:45 PM      Component Value Range   Color, Urine AMBER (*) YELLOW    APPearance CLOUDY (*) CLEAR    Specific Gravity, Urine 1.036 (*) 1.005 - 1.030    pH 6.0  5.0 - 8.0    Glucose, UA >1000 (*) NEGATIVE (mg/dL)   Hgb urine dipstick LARGE (*) NEGATIVE    Bilirubin Urine SMALL (*) NEGATIVE    Ketones, ur 15 (*) NEGATIVE (mg/dL)   Protein, ur 829 (*) NEGATIVE (mg/dL)   Urobilinogen, UA 1.0  0.0 - 1.0 (mg/dL)   Nitrite NEGATIVE  NEGATIVE    Leukocytes, UA TRACE (*) NEGATIVE   URINE MICROSCOPIC-ADD ON     Status: Abnormal   Collection Time   04/14/12 11:45 PM      Component Value Range   Squamous Epithelial / LPF FEW (*) RARE    WBC, UA 3-6  <3 (WBC/hpf)   RBC / HPF 0-2  <3 (RBC/hpf)   Bacteria, UA RARE  RARE    Casts HYALINE CASTS (*) NEGATIVE   LIPID  PANEL     Status: Abnormal   Collection Time   04/15/12  4:30 AM      Component Value Range   Cholesterol 156  0 - 200 (mg/dL)   Triglycerides 161 (*) <150 (mg/dL)   HDL 45  >09 (mg/dL)   Total CHOL/HDL Ratio 3.5     VLDL 32  0 - 40 (mg/dL)   LDL Cholesterol 79  0 - 99 (mg/dL)  MAGNESIUM     Status: Normal   Collection Time   04/15/12  4:30 AM      Component Value Range   Magnesium 1.9  1.5 - 2.5 (mg/dL)  PHOSPHORUS     Status: Abnormal   Collection Time   04/15/12  4:30 AM      Component Value Range   Phosphorus 2.1 (*) 2.3 - 4.6 (mg/dL)  TSH     Status: Normal   Collection Time   04/15/12  4:30 AM      Component Value Range   TSH 0.373  0.350 - 4.500 (uIU/mL)  PROTIME-INR     Status: Abnormal   Collection Time   04/15/12  4:30 AM      Component Value Range   Prothrombin Time 16.0 (*) 11.6 - 15.2 (seconds)   INR 1.25  0.00 - 1.49   APTT     Status: Abnormal   Collection Time   04/15/12  4:30 AM      Component Value Range   aPTT 43 (*) 24 - 37 (seconds)  CBC     Status: Abnormal   Collection  Time   04/15/12  4:30 AM      Component Value Range   WBC 15.1 (*) 4.0 - 10.5 (K/uL)   RBC 4.48  4.22 - 5.81 (MIL/uL)   Hemoglobin 14.1  13.0 - 17.0 (g/dL)   HCT 60.4  54.0 - 98.1 (%)   MCV 87.9  78.0 - 100.0 (fL)   MCH 31.5  26.0 - 34.0 (pg)   MCHC 35.8  30.0 - 36.0 (g/dL)   RDW 19.1  47.8 - 29.5 (%)   Platelets 204  150 - 400 (K/uL)  DIFFERENTIAL     Status: Abnormal   Collection Time   04/15/12  4:30 AM      Component Value Range   Neutrophils Relative 82 (*) 43 - 77 (%)   Neutro Abs 12.3 (*) 1.7 - 7.7 (K/uL)   Lymphocytes Relative 9 (*) 12 - 46 (%)   Lymphs Abs 1.3  0.7 - 4.0 (K/uL)   Monocytes Relative 9  3 - 12 (%)   Monocytes Absolute 1.4 (*) 0.1 - 1.0 (K/uL)   Eosinophils Relative 0  0 - 5 (%)   Eosinophils Absolute 0.0  0.0 - 0.7 (K/uL)   Basophils Relative 0  0 - 1 (%)   Basophils Absolute 0.0  0.0 - 0.1 (K/uL)  HEMOGLOBIN A1C     Status: Abnormal   Collection Time   04/15/12  4:30 AM      Component Value Range   Hemoglobin A1C 6.9 (*) <5.7 (%)   Mean Plasma Glucose 151 (*) <117 (mg/dL)  GLUCOSE, CAPILLARY     Status: Abnormal   Collection Time   04/15/12  8:17 AM      Component Value Range   Glucose-Capillary 210 (*) 70 - 99 (mg/dL)   Comment 1 Notify RN     Comment 2 Documented in Chart    COMPREHENSIVE METABOLIC PANEL     Status:  Abnormal   Collection Time   04/15/12  8:30 AM      Component Value Range   Sodium 143  135 - 145 (mEq/L)   Potassium 3.6  3.5 - 5.1 (mEq/L)   Chloride 105  96 - 112 (mEq/L)   CO2 27  19 - 32 (mEq/L)   Glucose, Bld 200 (*) 70 - 99 (mg/dL)   BUN 30 (*) 6 - 23 (mg/dL)   Creatinine, Ser 7.82  0.50 - 1.35 (mg/dL)   Calcium 8.8  8.4 - 95.6 (mg/dL)   Total Protein 6.2  6.0 - 8.3 (g/dL)   Albumin 2.8 (*) 3.5 - 5.2 (g/dL)   AST 67 (*) 0 - 37 (U/L)   ALT 28  0 - 53 (U/L)   Alkaline Phosphatase 107  39 - 117 (U/L)   Total Bilirubin 0.9  0.3 - 1.2 (mg/dL)   GFR calc non Af Amer 88 (*) >90 (mL/min)   GFR calc Af Amer >90  >90 (mL/min)    CARDIAC PANEL(CRET KIN+CKTOT+MB+TROPI)     Status: Abnormal   Collection Time   04/15/12  8:30 AM      Component Value Range   Total CK 4172 (*) 7 - 232 (U/L)   CK, MB 19.5 (*) 0.3 - 4.0 (ng/mL)   Troponin I <0.30  <0.30 (ng/mL)   Relative Index 0.5  0.0 - 2.5   GLUCOSE, CAPILLARY     Status: Abnormal   Collection Time   04/15/12 11:54 AM      Component Value Range   Glucose-Capillary 178 (*) 70 - 99 (mg/dL)   Comment 1 Notify RN     Comment 2 Documented in Chart     Dg Chest 2 View  04/14/2012  *RADIOLOGY REPORT*  Clinical Data: Chest pain.  History of diabetes.  CHEST - 2 VIEW  Comparison: Chest x-ray 07/17/2011.  Findings: Lung volumes are normal.  No consolidative airspace disease.  No pleural effusions.  No pneumothorax.  No pulmonary nodule or mass noted.  Pulmonary vasculature and the cardiomediastinal silhouette are within normal limits.  Lateral view and demonstrates a potential compression fracture in the mid lumbar spine which is incompletely visualized.  IMPRESSION: 1. No radiographic evidence of acute cardiopulmonary disease. 2.  Potential compression fracture the mid lumbar spine incompletely visualized. If in fact this is real, this would be new compared to prior CT of abdomen and pelvis dated 05/09/2011. Clinical correlation for point tenderness in this region is recommended.  This could be better evaluated with dedicated lumbar spine radiographs if clinically indicated.  Original Report Authenticated By: Florencia Reasons, M.D.   Dg Femur Right  04/14/2012  *RADIOLOGY REPORT*  Clinical Data: Larey Seat.  Right hip pain.  RIGHT FEMUR - 2 VIEW  Comparison: None  Findings: The hip joint is maintained.  There are mild degenerative changes.  No acute fracture.  The knee joint demonstrates moderate degenerative changes.  No femur fracture.  IMPRESSION: No acute femur fracture.  Original Report Authenticated By: P. Loralie Champagne, M.D.   Dg Tibia/fibula Right  04/14/2012  *RADIOLOGY REPORT*   Clinical Data: Larey Seat.  Right leg pain.  RIGHT TIBIA AND FIBULA - 2 VIEW  Comparison: None  Findings: The knee and ankle joints are maintained.  Moderate degenerative changes involving the knee and ankle.  No fracture of the tibia or fibula is identified.  Probable remote trauma involving the ankle.  IMPRESSION: No acute bony findings.  Original Report Authenticated By: P. Loralie Champagne, M.D.  Dg Knee Complete 4 Views Right  04/14/2012  *RADIOLOGY REPORT*  Clinical Data: Larey Seat.  RIGHT KNEE - COMPLETE 4+ VIEW  Comparison: None.  Findings: There are moderate tricompartmental degenerative changes but no acute fracture.  Possible small joint effusion.  Multiple skin lesions could be due to neuro fibromatosis.  IMPRESSION:  1.  No acute fracture. 2.  Moderate degenerative changes. 3.  Possible small joint effusion.  Original Report Authenticated By: P. Loralie Champagne, M.D.    Assessment/Plan: Diagnosis: Rhabdomyolysis, right leg wounds 1. Does the need for close, 24 hr/day medical supervision in concert with the patient's rehab needs make it unreasonable for this patient to be served in a less intensive setting? No and Potentially 2. Co-Morbidities requiring supervision/potential complications: DM, HTN,  3. Due to bladder management, bowel management, safety, skin/wound care, disease management, medication administration, pain management and patient education, does the patient require 24 hr/day rehab nursing? No 4. Does the patient require coordinated care of a physician, rehab nurse, PT (1-2 hrs/day, 5 days/week) and OT (1-2 hrs/day, 5 days/week) to address physical and functional deficits in the context of the above medical diagnosis(es)? No Addressing deficits in the following areas: balance, endurance, locomotion, strength, transferring, bowel/bladder control, bathing, dressing, feeding, grooming, toileting and psychosocial support 5. Can the patient actively participate in an intensive therapy program of  at least 3 hrs of therapy per day at least 5 days per week? Potentially 6. The potential for patient to make measurable gains while on inpatient rehab is fair 7. Anticipated functional outcomes upon discharge from inpatient rehab are n/a or TBD. 8. Estimated rehab length of stay to reach the above functional goals is: n/a or TBD 9. Does the patient have adequate social supports to accommodate these discharge functional goals? Yes 10. Anticipated D/C setting: Home 11. Anticipated post D/C treatments: HH therapy 12. Overall Rehab/Functional Prognosis: excellent  RECOMMENDATIONS: This patient's condition is appropriate for continued rehabilitative care in the following setting: [facility] Patient has agreed to participate in recommended program. Yes and Potentially Note that insurance prior authorization may be required for reimbursement for recommended care.  Comment: It's very early in this admission. Based on his premorbid functional level and his performance with PT today, it is likely that he will progress quickly and be able to go home with [facility].  Rehab RN to follow up.  Ivory Broad, MD    04/15/2012

## 2012-04-16 DIAGNOSIS — L03039 Cellulitis of unspecified toe: Secondary | ICD-10-CM | POA: Diagnosis not present

## 2012-04-16 DIAGNOSIS — I10 Essential (primary) hypertension: Secondary | ICD-10-CM | POA: Diagnosis not present

## 2012-04-16 DIAGNOSIS — E1165 Type 2 diabetes mellitus with hyperglycemia: Secondary | ICD-10-CM | POA: Diagnosis not present

## 2012-04-16 DIAGNOSIS — L02619 Cutaneous abscess of unspecified foot: Secondary | ICD-10-CM

## 2012-04-16 DIAGNOSIS — M6282 Rhabdomyolysis: Secondary | ICD-10-CM

## 2012-04-16 DIAGNOSIS — L02419 Cutaneous abscess of limb, unspecified: Secondary | ICD-10-CM

## 2012-04-16 DIAGNOSIS — E118 Type 2 diabetes mellitus with unspecified complications: Secondary | ICD-10-CM

## 2012-04-16 DIAGNOSIS — L03119 Cellulitis of unspecified part of limb: Secondary | ICD-10-CM

## 2012-04-16 LAB — BASIC METABOLIC PANEL
CO2: 26 mEq/L (ref 19–32)
Calcium: 8.1 mg/dL — ABNORMAL LOW (ref 8.4–10.5)
GFR calc non Af Amer: >90 mL/min (ref >90)
Glucose, Bld: 161 mg/dL — ABNORMAL HIGH (ref 70–99)
Potassium: 3.6 mEq/L (ref 3.5–5.1)
Sodium: 141 mEq/L (ref 135–145)

## 2012-04-16 LAB — URINE CULTURE: Culture  Setup Time: 201306031012

## 2012-04-16 LAB — CK: Total CK: 2657 U/L — ABNORMAL HIGH (ref 7–232)

## 2012-04-16 LAB — GLUCOSE, CAPILLARY: Glucose-Capillary: 234 mg/dL — ABNORMAL HIGH (ref 70–99)

## 2012-04-16 MED ORDER — DOXYCYCLINE HYCLATE 100 MG PO TABS
100.0000 mg | ORAL_TABLET | Freq: Two times a day (BID) | ORAL | Status: DC
  Administered 2012-04-16 – 2012-04-18 (×5): 100 mg via ORAL
  Filled 2012-04-16 (×6): qty 1

## 2012-04-16 MED ORDER — POTASSIUM CHLORIDE IN NACL 20-0.9 MEQ/L-% IV SOLN
INTRAVENOUS | Status: DC
  Administered 2012-04-16 – 2012-04-18 (×3): via INTRAVENOUS
  Filled 2012-04-16 (×6): qty 1000

## 2012-04-16 NOTE — Progress Notes (Addendum)
CARE MANAGEMENT NOTE 04/16/2012  Comments:  04/16/12 11:31 Vance Peper, RN BSN Case Manager [phone] Spoke with patient. Provided him with information for the [employer]. Will follow for any home health needs.

## 2012-04-16 NOTE — Progress Notes (Signed)
Physical Therapy Treatment Patient Details Name: Arthur Berry MRN: [id number] DOB: 12-03-1945 Today's Date: 04/16/2012 Time: 4098-1191 PT Time Calculation (min): 19 min  PT Assessment / Plan / Recommendation Comments on Treatment Session  Pt progressing with PT goals + mobility.   Initiated stair training this session- pt did well with stairs.   Encouraged pt to ambulate 2-3x's/day with Nsing.      Follow Up Recommendations  Inpatient Rehab;Other (comment);Home health PT    Barriers to Discharge        Equipment Recommendations  Defer to next venue    Recommendations for Other Services    Frequency Min 3X/week   Plan Discharge plan remains appropriate    Precautions / Restrictions Precautions Precautions: Fall Restrictions Weight Bearing Restrictions: No       Mobility  Bed Mobility Bed Mobility: Not assessed Rolling Right: 4: Min assist Right Sidelying to Sit: With rails;4: Min assist Sitting - Scoot to Edge of Bed: 4: Min assist Details for Bed Mobility Assistance: Pt sitting in recliner before & after session Transfers Transfers: Sit to Stand;Stand to Sit Sit to Stand: 4: Min assist;With upper extremity assist;With armrests;From chair/3-in-1 Stand to Sit: 5: Supervision;With upper extremity assist;With armrests;To chair/3-in-1 Details for Transfer Assistance: (A) to achieve standing, balance.  Cues for safest hand placement & use of UE's to control descent with stand>sit.  Ambulation/Gait Ambulation/Gait Assistance: 4: Min guard Ambulation Distance (Feet): 150 Feet Assistive device: Rolling walker Ambulation/Gait Assistance Details: Guarding for safety due to RLE weakness & pt with fall last night due to Rt knee giving out per pt.  Cues for tall posture, & stay inside RW.   Gait Pattern: Step-through pattern;Decreased step length - right;Decreased step length - left;Decreased stride length;Antalgic Stairs: Yes Stairs Assistance: 4: Min assist Stairs Assistance  Details (indicate cue type and reason): Cues for sequencing & technique.  Performed 2 different ways:  Rail on Rt with Lt HHA; Bil UE support on Rt rail.   Stair Management Technique: One rail Right;Forwards;Other (comment) (HHA on Lt) Number of Stairs: 2  (2x's) Wheelchair Mobility Wheelchair Mobility: No    Exercises     PT Diagnosis:    PT Problem List:   PT Treatment Interventions:     PT Goals Acute Rehab PT Goals Time For Goal Achievement: 04/15/12 Potential to Achieve Goals: Good PT Goal: Sit to Stand - Progress: Progressing toward goal PT Goal: Ambulate - Progress: Progressing toward goal PT Goal: Up/Down Stairs - Progress: Progressing toward goal  Visit Information  Last PT Received On: 04/16/12 Assistance Needed: +1    Subjective Data      Cognition  Overall Cognitive Status: Appears within functional limits for tasks assessed/performed Arousal/Alertness: Awake/alert Orientation Level: Appears intact for tasks assessed Behavior During Session: [facility] for tasks performed    Balance  Balance Balance Assessed: No  End of Session PT - End of Session Equipment Utilized During Treatment: Gait belt Activity Tolerance: Patient tolerated treatment well Patient left: in chair;with call bell/phone within reach    Lara Mulch 04/16/2012, 11:16 AM  Verdell Face, PTA [phone] 04/16/2012

## 2012-04-16 NOTE — Progress Notes (Signed)
ANTIBIOTIC CONSULT NOTE - FOLLOW UP  Pharmacy Consult Indication: Vancomycin/Levoquin  No Known Allergies  Patient Measurements: Height: 5\' 10"  (177.8 cm) Weight: 207 lb 10.8 oz (94.2 kg) IBW/kg (Calculated) : 73    Vital Signs: Temp: 97.7 F (36.5 C) (06/04 0621) BP: 145/66 mmHg (06/04 0621) Pulse Rate: 95  (06/04 0621) Intake/Output from previous day: 06/03 0701 - 06/04 0700 In: 2105 [P.O.:605; I.V.:1500] Out: 300 [Urine:300] Intake/Output from this shift: Total I/O In: 360 [P.O.:360] Out: 450 [Urine:450]  Labs:  [facility] 04/16/12 0612 04/15/12 0830 04/15/12 0430 04/14/12 2218 04/14/12 1603  WBC -- -- 15.1* -- 22.9*  HGB -- -- 14.1 -- 17.5*  PLT -- -- 204 -- 258  LABCREA -- -- -- -- --  CREATININE 0.71 0.86 -- 0.83 --   Estimated Creatinine Clearance: 104.7 ml/min (by C-G formula based on Cr of 0.71). No results found for this basename: VANCOTROUGH:2,VANCOPEAK:2,VANCORANDOM:2,GENTTROUGH:2,GENTPEAK:2,GENTRANDOM:2,TOBRATROUGH:2,TOBRAPEAK:2,TOBRARND:2,AMIKACINPEAK:2,AMIKACINTROU:2,AMIKACIN:2, in the last 72 hours   Microbiology: Recent Results (from the past 720 hour(s))  MRSA PCR SCREENING     Status: Normal   Collection Time   04/14/12 11:16 PM      Component Value Range Status Comment   MRSA by PCR NEGATIVE  NEGATIVE  Final   WOUND CULTURE     Status: Normal (Preliminary result)   Collection Time   04/15/12  3:35 AM      Component Value Range Status Comment   Specimen Description WOUND RIGHT LEG   Final    Special Requests NONE   Final    Gram Stain     Final    Value: RARE WBC PRESENT,BOTH PMN AND MONONUCLEAR     NO SQUAMOUS EPITHELIAL CELLS SEEN     NO ORGANISMS SEEN   Culture PENDING   Incomplete    Report Status PENDING   Incomplete     Anti-infectives     Start     Dose/Rate Route Frequency Ordered Stop   04/15/12 2000   levofloxacin (LEVAQUIN) IVPB 750 mg        750 mg 100 mL/hr over 90 Minutes Intravenous Every 24 hours 04/14/12 1849     04/15/12  0800   vancomycin (VANCOCIN) 1,250 mg in sodium chloride 0.9 % 250 mL IVPB        1,250 mg 166.7 mL/hr over 90 Minutes Intravenous Every 12 hours 04/14/12 1849     04/15/12 0200   piperacillin-tazobactam (ZOSYN) IVPB 3.375 g  Status:  Discontinued        3.375 g 12.5 mL/hr over 240 Minutes Intravenous Every 8 hours 04/14/12 1849 04/15/12 1821   04/14/12 1900   vancomycin (VANCOCIN) 1,250 mg in sodium chloride 0.9 % 250 mL IVPB        1,250 mg 166.7 mL/hr over 90 Minutes Intravenous To [facility] 04/14/12 1849 04/14/12 2310   04/14/12 1900  piperacillin-tazobactam (ZOSYN) IVPB 3.375 g       3.375 g 100 mL/hr over 30 Minutes Intravenous [facility] 04/14/12 1849 04/14/12 2129   04/14/12 1900   levofloxacin (LEVAQUIN) IVPB 750 mg        750 mg 100 mL/hr over 90 Minutes Intravenous To [facility] 04/14/12 1849 04/14/12 2057          Assessment: 66 yo who was admitted for a lower extremity cellulitis. Pt was found down after 2 days. Also being r/o for rhabdo. Empiric abx with vanc Zosyn dced 6/3  Goal of Therapy:  Vancomycin trough level 10-15 mcg/ml  Plan:  Cont vanc 1250mg  IV q12h Vanc trough 1930 Cont levaquin 750 mg iv q24h  Lucille Passy 04/16/2012,9:24 AM

## 2012-04-16 NOTE — Progress Notes (Signed)
2055     04/15/12------  Pt fell  while coming out of the BR doorway, was ambulating  using the walker.  No injuries.   Rt knee gave way and pt went down on his rt side. Fall was witnessed by  RN who was helping the pt.  RN   Pt's POA advised.  MD and unit director advised.

## 2012-04-16 NOTE — Progress Notes (Signed)
[facility]  Subjective: Sitting on chair when he was interviewed, denies any significant pain  Objective: Blood pressure 145/66, pulse 95, temperature 97.7 F (36.5 C), temperature source Oral, resp. rate 18, height 5\' 10"  (1.778 m), weight 94.2 kg (207 lb 10.8 oz), SpO2 97.00%.  Intake/Output from previous day: 06/03 0701 - 06/04 0700 In: 2105 [P.O.:605; I.V.:1500] Out: 300 [Urine:300] Intake/Output this shift: Total I/O In: 360 [P.O.:360] Out: 450 [Urine:450]  General appearance: alert, cooperative, appears stated age and no distress Resp: clear to auscultation bilaterally Cardio: regular rate and rhythm, S1, S2 normal, no murmur, click, rub or gallop GI: soft, non-tender; bowel sounds normal; no masses,  no organomegaly Extremities: Right lower extremity markedly swollen and covered with a dressing but pulses intact and foot is warm, noted with 2+ edema right foot, left lower extremity unremarkable Neurologic: Grossly normal  Lab Results:  Basename 04/15/12 0430 04/14/12 1603  WBC 15.1* 22.9*  HGB 14.1 17.5*  HCT 39.4 48.3  PLT 204 258   BMET  Basename 04/16/12 0612 04/15/12 0830  NA 141 143  K 3.6 3.6  CL 107 105  CO2 26 27  GLUCOSE 161* 200*  BUN 18 30*  CREATININE 0.71 0.86  CALCIUM 8.1* 8.8    Studies/Results: Dg Chest 2 View  04/14/2012  *RADIOLOGY REPORT*  Clinical Data: Chest pain.  History of diabetes.  CHEST - 2 VIEW  Comparison: Chest x-ray 07/17/2011.  Findings: Lung volumes are normal.  No consolidative airspace disease.  No pleural effusions.  No pneumothorax.  No pulmonary nodule or mass noted.  Pulmonary vasculature and the cardiomediastinal silhouette are within normal limits.  Lateral view and demonstrates a potential compression fracture in the mid lumbar spine which is incompletely visualized.  IMPRESSION: 1. No radiographic evidence of acute cardiopulmonary disease. 2.  Potential compression fracture the mid lumbar spine incompletely  visualized. If in fact this is real, this would be new compared to prior CT of abdomen and pelvis dated 05/09/2011. Clinical correlation for point tenderness in this region is recommended.  This could be better evaluated with dedicated lumbar spine radiographs if clinically indicated.  Original Report Authenticated By: Florencia Reasons, M.D.   Dg Femur Right  04/14/2012  *RADIOLOGY REPORT*  Clinical Data: Larey Seat.  Right hip pain.  RIGHT FEMUR - 2 VIEW  Comparison: None  Findings: The hip joint is maintained.  There are mild degenerative changes.  No acute fracture.  The knee joint demonstrates moderate degenerative changes.  No femur fracture.  IMPRESSION: No acute femur fracture.  Original Report Authenticated By: P. Loralie Champagne, M.D.   Dg Tibia/fibula Right  04/14/2012  *RADIOLOGY REPORT*  Clinical Data: Larey Seat.  Right leg pain.  RIGHT TIBIA AND FIBULA - 2 VIEW  Comparison: None  Findings: The knee and ankle joints are maintained.  Moderate degenerative changes involving the knee and ankle.  No fracture of the tibia or fibula is identified.  Probable remote trauma involving the ankle.  IMPRESSION: No acute bony findings.  Original Report Authenticated By: P. Loralie Champagne, M.D.   Dg Knee Complete 4 Views Right  04/14/2012  *RADIOLOGY REPORT*  Clinical Data: Larey Seat.  RIGHT KNEE - COMPLETE 4+ VIEW  Comparison: None.  Findings: There are moderate tricompartmental degenerative changes but no acute fracture.  Possible small joint effusion.  Multiple skin lesions could be due to neuro fibromatosis.  IMPRESSION:  1.  No acute fracture. 2.  Moderate degenerative changes. 3.  Possible small joint effusion.  Original Report  Authenticated By: Demetrius Charity. Loralie Champagne, M.D.   Medications: I have reviewed the patient's current medications.  Assessment/Plan:  Rhabdomyolysis with entrapment/stasis injury of R LE *CPK 7500 at presentation and has now trended down to 4100, we'll not check CPK again *Continue IV fluid  rehydration, stable creatinine *Right lower extremity Doppler studies is negative for DVTs. *Appreciate wound ostomy nurse assistance *Felt to have diffuse partial thickness related traumatic wounds sustained after fall and entrapment in recliner chair *Continue empiric broad-spectrum antibiotic coverage since is diabetic with Vancomycin and Levaquin *Appreciate orthopedics as well as general surgery consultations *No evidence of compartment syndrome and no indications for surgical intervention at this time  AKI (acute kidney injury) / Dehydration *Precipitated by dehydration and associated dysmobility after fall with entrapment  *Creatinine stable  Leukocytosis *Secondary to injury of right lower extremity as well as dehydration and acute kidney injury *I'll discontinue the IV antibiotics, place him on doxycycline for total of 10 days.  Hypophosphatemia *Repeat lab in am - likely related to acute dehydration and volume shifting  DIABETES MELLITUS-TYPE II *CBG elevated greater than 300 initially and now trended downward to 200 *adjust Lantus and sliding scale insulin  HTN (hypertension) *Continue Norvasc and metoprolol  Seizure disorder *Continue Keppra and Dilantin  Disposition *Stays as inpatient, likely home in the morning with home health PT, versus CIR.   LOS: 2 days   Arthur Berry A 04/16/2012, 11:48 AM

## 2012-04-16 NOTE — Progress Notes (Signed)
Occupational Therapy Evaluation Patient Details Name: Arthur Berry MRN: [id number] DOB: [birth year] Today's Date: 04/16/2012 Time: 4098-1191 OT Time Calculation (min): 43 min  OT Assessment / Plan / Recommendation Clinical Impression  66 y.o. pt admitted with R leg injury/ cellulitis from being closed up in a recliner for a prolonged period.  Pt is weak in UE's, but was able to mobilize with RW.  Bed Mobility was difficult. Pt. experienced a fall last night in the bathroom-OT worked on toilet transfer and ambulating in bathroom with RW. Pt. would benefit from OT services to maximize independence in ADLs.    OT Assessment  Patient needs continued OT Services    Follow Up Recommendations  Inpatient Rehab    Barriers to Discharge      Equipment Recommendations  Defer to next venue    Recommendations for Other Services    Frequency  Min 2X/week    Precautions / Restrictions Precautions Precautions: Fall Restrictions Weight Bearing Restrictions: No   Pertinent Vitals/Pain Vitals Monitored and Stable. Pain reported 6/10 in RLE and nurse notified.     ADL  Grooming: Performed;Teeth care;Min guard Where Assessed - Grooming: Supported standing Toilet Transfer: Performed;Min Pension scheme manager Method: Surveyor, minerals: Raised toilet seat with arms (or 3-in-1 over toilet) Toileting - Clothing Manipulation and Hygiene: Performed;Min guard Where Assessed - Engineer, mining and Hygiene: Standing Equipment Used: Rolling walker;Gait belt ADL Comments: Pt. did well in session. He ambulated to sink with RW and stood to brush his teeth. He proceeded to walk to bathroom and practice toilet transfer. He required MinGuard.   Pt provided education on crossing thresholds with RW and body position in RW with transfers to increase safety.   OT Diagnosis:    OT Problem List: Decreased strength;Impaired balance (sitting and/or standing);Decreased knowledge of  use of DME or AE;Pain OT Treatment Interventions: Self-care/ADL training;Therapeutic exercise;DME and/or AE instruction;Patient/family education;Balance training   OT Goals Acute Rehab OT Goals OT Goal Formulation: With patient Time For Goal Achievement: 04/30/12 Potential to Achieve Goals: Good ADL Goals Pt Will Perform Upper Body Bathing: with modified independence;Sitting in shower ADL Goal: Upper Body Bathing - Progress: Goal set today Pt Will Perform Lower Body Bathing: Sit to stand in shower;with supervision ADL Goal: Lower Body Bathing - Progress: Goal set today Pt Will Perform Upper Body Dressing: with modified independence;Sitting, bed;Unsupported ADL Goal: Upper Body Dressing - Progress: Goal set today Pt Will Perform Lower Body Dressing: Sit to stand from bed;Supported;with supervision ADL Goal: Lower Body Dressing - Progress: Goal set today Pt Will Transfer to Toilet: with modified independence;3-in-1;Stand pivot transfer ADL Goal: Toilet Transfer - Progress: Goal set today Pt Will Perform Toileting - Clothing Manipulation: with modified independence;Standing ADL Goal: Toileting - Clothing Manipulation - Progress: Goal set today Pt Will Perform Toileting - Hygiene: with modified independence;Leaning right and/or left on 3-in-1/toilet ADL Goal: Toileting - Hygiene - Progress: Goal set today  Visit Information  Last OT Received On: 04/16/12 Assistance Needed: +1    Subjective Data  Subjective: "My sister looks out for me."   Prior Functioning  Home Living Lives With: Alone Available Help at Discharge: Other (Comment) Type of Home: House Home Access: Stairs to enter [employer] of Steps: 3 Entrance Stairs-Rails: Right;Left Home Layout: One level;Able to live on main level with bedroom/bathroom Bathroom Shower/Tub: Tub/shower unit;Curtain Bathroom Toilet: Standard Bathroom Accessibility: Yes How Accessible: Accessible via walker Home Adaptive Equipment:  Straight cane Prior Function Level of Independence: Independent  Able to Take Stairs?: Yes Driving: Yes Vocation: Full time employment Communication Communication: No difficulties Dominant Hand: Left    Cognition  Overall Cognitive Status: Appears within functional limits for tasks assessed/performed Arousal/Alertness: Awake/alert Orientation Level: Appears intact for tasks assessed Behavior During Session: [facility] for tasks performed    Extremity/Trunk Assessment Right Upper Extremity Assessment RUE ROM/Strength/Tone: [facility] for tasks assessed (grossly assessed. Demonstrates 3/5.) Left Upper Extremity Assessment LUE ROM/Strength/Tone: WFL for tasks assessed (Grossly assessed 3/5.)   Mobility Bed Mobility Bed Mobility: Rolling Right;Right Sidelying to Sit;Sitting - Scoot to Edge of Bed Rolling Right: 4: Min assist Right Sidelying to Sit: With rails;4: Min assist Sitting - Scoot to Edge of Bed: 4: Min assist Details for Bed Mobility Assistance: required verbal cues for hand placement and minimal physical assit. Transfers Transfers: Sit to Stand;Stand to Sit Sit to Stand: From bed;With upper extremity assist;From toilet;4: Min assist Stand to Sit: 4: Min guard;With upper extremity assist;To chair/3-in-1           End of Session OT - End of Session Activity Tolerance: Patient tolerated treatment well Patient left: in chair;with call bell/phone within reach     Jenell Milliner 04/16/2012, 10:43 AM   Lucile Shutters   OTR/L Pager: [phone] Office: [phone] . Marland Kitchen

## 2012-04-16 NOTE — Progress Notes (Signed)
Recommend Home with home health PT and OT and assist of family. Not in need of inpt rehab at this time. Please call  [phone] with questions.

## 2012-04-16 NOTE — Progress Notes (Signed)
Subjective: Doing well this morning, no new complaints voiced by patient.  Objective: Vital signs in last 24 hours: Temp:  [97.6 F (36.4 C)-99.1 F (37.3 C)] 97.7 F (36.5 C) (06/04 0621) Pulse Rate:  [95-99] 95  (06/04 0621) Resp:  [18-20] 18  (06/04 0621) BP: (145-158)/(66-76) 145/66 mmHg (06/04 0621) SpO2:  [97 %-99 %] 97 % (06/04 0621) Weight:  [207 lb 10.8 oz (94.2 kg)] 207 lb 10.8 oz (94.2 kg) (06/04 0621) Last BM Date: 04/11/12  Intake/Output from previous day: 06/03 0701 - 06/04 0700 In: 2105 [P.O.:605; I.V.:1500] Out: 300 [Urine:300] Intake/Output this shift: Total I/O In: 360 [P.O.:360] Out: 450 [Urine:450]  Incision/Wound:wound was examined,still with some small blisters in the upper portion of the thigh, serous drainage on bandage noted. No increased erythema noted, abrasion wound on inner thigh appears to be healing well.   Lab Results:   Basename 04/15/12 0430 04/14/12 1603  WBC 15.1* 22.9*  HGB 14.1 17.5*  HCT 39.4 48.3  PLT 204 258   BMET  Basename 04/16/12 0612 04/15/12 0830  NA 141 143  K 3.6 3.6  CL 107 105  CO2 26 27  GLUCOSE 161* 200*  BUN 18 30*  CREATININE 0.71 0.86  CALCIUM 8.1* 8.8   PT/INR  Basename 04/15/12 0430  LABPROT 16.0*  INR 1.25   ABG No results found for this basename: PHART:2,PCO2:2,PO2:2,HCO3:2 in the last 72 hours  Studies/Results: Dg Chest 2 View  04/14/2012  *RADIOLOGY REPORT*  Clinical Data: Chest pain.  History of diabetes.  CHEST - 2 VIEW  Comparison: Chest x-ray 07/17/2011.  Findings: Lung volumes are normal.  No consolidative airspace disease.  No pleural effusions.  No pneumothorax.  No pulmonary nodule or mass noted.  Pulmonary vasculature and the cardiomediastinal silhouette are within normal limits.  Lateral view and demonstrates a potential compression fracture in the mid lumbar spine which is incompletely visualized.  IMPRESSION: 1. No radiographic evidence of acute cardiopulmonary disease. 2.  Potential  compression fracture the mid lumbar spine incompletely visualized. If in fact this is real, this would be new compared to prior CT of abdomen and pelvis dated 05/09/2011. Clinical correlation for point tenderness in this region is recommended.  This could be better evaluated with dedicated lumbar spine radiographs if clinically indicated.  Original Report Authenticated By: Florencia Reasons, M.D.   Dg Femur Right  04/14/2012  *RADIOLOGY REPORT*  Clinical Data: Larey Seat.  Right hip pain.  RIGHT FEMUR - 2 VIEW  Comparison: None  Findings: The hip joint is maintained.  There are mild degenerative changes.  No acute fracture.  The knee joint demonstrates moderate degenerative changes.  No femur fracture.  IMPRESSION: No acute femur fracture.  Original Report Authenticated By: P. Loralie Champagne, M.D.   Dg Tibia/fibula Right  04/14/2012  *RADIOLOGY REPORT*  Clinical Data: Larey Seat.  Right leg pain.  RIGHT TIBIA AND FIBULA - 2 VIEW  Comparison: None  Findings: The knee and ankle joints are maintained.  Moderate degenerative changes involving the knee and ankle.  No fracture of the tibia or fibula is identified.  Probable remote trauma involving the ankle.  IMPRESSION: No acute bony findings.  Original Report Authenticated By: P. Loralie Champagne, M.D.   Dg Knee Complete 4 Views Right  04/14/2012  *RADIOLOGY REPORT*  Clinical Data: Larey Seat.  RIGHT KNEE - COMPLETE 4+ VIEW  Comparison: None.  Findings: There are moderate tricompartmental degenerative changes but no acute fracture.  Possible small joint effusion.  Multiple skin lesions could  be due to neuro fibromatosis.  IMPRESSION:  1.  No acute fracture. 2.  Moderate degenerative changes. 3.  Possible small joint effusion.  Original Report Authenticated By: P. Loralie Champagne, M.D.    Anti-infectives: Anti-infectives     Start     Dose/Rate Route Frequency Ordered Stop   04/15/12 2000   levofloxacin (LEVAQUIN) IVPB 750 mg        750 mg 100 mL/hr over 90 Minutes  Intravenous Every 24 hours 04/14/12 1849     04/15/12 0800   vancomycin (VANCOCIN) 1,250 mg in sodium chloride 0.9 % 250 mL IVPB        1,250 mg 166.7 mL/hr over 90 Minutes Intravenous Every 12 hours 04/14/12 1849     04/15/12 0200   piperacillin-tazobactam (ZOSYN) IVPB 3.375 g  Status:  Discontinued        3.375 g 12.5 mL/hr over 240 Minutes Intravenous Every 8 hours 04/14/12 1849 04/15/12 1821   04/14/12 1900   vancomycin (VANCOCIN) 1,250 mg in sodium chloride 0.9 % 250 mL IVPB        1,250 mg 166.7 mL/hr over 90 Minutes Intravenous To [facility] 04/14/12 1849 04/14/12 2310   04/14/12 1900   piperacillin-tazobactam (ZOSYN) IVPB 3.375 g        3.375 g 100 mL/hr over 30 Minutes Intravenous To [facility] 04/14/12 1849 04/14/12 2129   04/14/12 1900   levofloxacin (LEVAQUIN) IVPB 750 mg        750 mg 100 mL/hr over 90 Minutes Intravenous To [facility] 04/14/12 1849 04/14/12 2057          Assessment/Plan: s/p * No surgery found * Cellulitus of right leg with Rhabdomylytis  Plan:  1. Continue with wound care as per wound care recommendations. 2. Continue antibiotics 3.Continue OT/PT  LOS: 2 days    Mehdi Gironda 04/16/2012

## 2012-04-16 NOTE — Progress Notes (Signed)
Patient worked with OT this morning. Was minguard assist to sink and patient reports he feels much more confident after working with OT. Noted fall coming out of bathroom last night. Patient progressing well and I would recommend home with home health and family support at discharge. Please call with any questions. [phone]

## 2012-04-16 NOTE — Progress Notes (Signed)
Patient interviewed and examined. Agree with assessment and plans as outlined.  Leg compartments soft, neuro vascular exam right foot intact.  Blisters being appropriately dressed. No sinif. Infection. No debridement indicated.  Will follow.   Angelia Mould. Derrell Lolling, M.D., [facility] General and Minimally invasive Surgery Breast and Colorectal Surgery Office:   [phone] Pager:   [phone]

## 2012-04-17 DIAGNOSIS — S8780XA Crushing injury of unspecified lower leg, initial encounter: Secondary | ICD-10-CM | POA: Diagnosis not present

## 2012-04-17 DIAGNOSIS — L02619 Cutaneous abscess of unspecified foot: Secondary | ICD-10-CM | POA: Diagnosis not present

## 2012-04-17 DIAGNOSIS — E1165 Type 2 diabetes mellitus with hyperglycemia: Secondary | ICD-10-CM

## 2012-04-17 DIAGNOSIS — I10 Essential (primary) hypertension: Secondary | ICD-10-CM

## 2012-04-17 DIAGNOSIS — E118 Type 2 diabetes mellitus with unspecified complications: Secondary | ICD-10-CM

## 2012-04-17 DIAGNOSIS — M6282 Rhabdomyolysis: Secondary | ICD-10-CM | POA: Diagnosis not present

## 2012-04-17 DIAGNOSIS — L03039 Cellulitis of unspecified toe: Secondary | ICD-10-CM

## 2012-04-17 LAB — GLUCOSE, CAPILLARY
Glucose-Capillary: 164 mg/dL — ABNORMAL HIGH (ref 70–99)
Glucose-Capillary: 127 mg/dL — ABNORMAL HIGH (ref 70–99)

## 2012-04-17 LAB — BASIC METABOLIC PANEL
BUN: 14 mg/dL (ref 6–23)
Chloride: 106 mEq/L (ref 96–112)
Creatinine, Ser: 0.62 mg/dL (ref 0.50–1.35)
GFR calc Af Amer: >90 mL/min (ref >90)
Glucose, Bld: 165 mg/dL — ABNORMAL HIGH (ref 70–99)
Potassium: 3.5 mEq/L (ref 3.5–5.1)

## 2012-04-17 LAB — WOUND CULTURE

## 2012-04-17 NOTE — Progress Notes (Signed)
[facility] Progress Note  04/17/2012 Subjective: Pt says he is feeling much better today.  Wound healing.  He is willing to go to rehab.    Objective:  Vital signs in last 24 hours: Filed Vitals:   04/16/12 0621 04/16/12 1409 04/17/12 0010 04/17/12 0600  BP: 145/66 145/67  148/71  Pulse: 95 86 87 85  Temp: 97.7 F (36.5 C) 98.2 F (36.8 C)  98.7 F (37.1 C)  TempSrc:    Oral  Resp: 18 18 18 16   Height:      Weight: 94.2 kg (207 lb 10.8 oz)   94.983 kg (209 lb 6.4 oz)  SpO2: 97% 100% 99% 100%   Weight change: 0.783 kg (1 lb 11.6 oz)  Intake/Output Summary (Last 24 hours) at 04/17/12 1203 Last data filed at 04/17/12 0700  Gross per 24 hour  Intake    875 ml  Output    750 ml  Net    125 ml   Lab Results  Component Value Date   HGBA1C 6.9* 04/15/2012   HGBA1C  Value: 7.0 (NOTE) The ADA recommends the following therapeutic goal for glycemic control related to Hgb A1c measurement: Goal of therapy: <6.5 Hgb A1c  Reference: American Diabetes Association: Clinical Practice Recommendations 2010, Diabetes Care, 2010, 33: (Suppl  1).* 01/12/2010   Lab Results  Component Value Date   LDLCALC 79 04/15/2012   CREATININE 0.62 04/17/2012    Review of Systems As above, otherwise all reviewed and reported negative  Physical Exam General appearance: alert, cooperative, appears stated age and no distress  Resp: clear to auscultation bilaterally  Cardio: regular rate and rhythm, S1, S2 normal, no murmur, click, rub or gallop  GI: soft, non-tender; bowel sounds normal; no masses, no organomegaly  Extremities: Right lower extremity markedly swollen and covered with a dressing but pulses intact and foot is warm, noted with 2+ edema right foot, left lower extremity unremarkable  Neurologic: Grossly normal  Lab Results: Results for orders placed during the hospital encounter of 04/14/12 (from the past 24 hour(s))  GLUCOSE, CAPILLARY     Status: Abnormal   Collection Time   04/16/12  4:31  PM      Component Value Range   Glucose-Capillary 110 (*) 70 - 99 (mg/dL)  GLUCOSE, CAPILLARY     Status: Abnormal   Collection Time   04/16/12 10:44 PM      Component Value Range   Glucose-Capillary 190 (*) 70 - 99 (mg/dL)   Comment 1 Documented in Chart     Comment 2 Notify RN    BASIC METABOLIC PANEL     Status: Abnormal   Collection Time   04/17/12  5:35 AM      Component Value Range   Sodium 139  135 - 145 (mEq/L)   Potassium 3.5  3.5 - 5.1 (mEq/L)   Chloride 106  96 - 112 (mEq/L)   CO2 25  19 - 32 (mEq/L)   Glucose, Bld 165 (*) 70 - 99 (mg/dL)   BUN 14  6 - 23 (mg/dL)   Creatinine, Ser 4.54  0.50 - 1.35 (mg/dL)   Calcium 8.2 (*) 8.4 - 10.5 (mg/dL)   GFR calc non Af Amer >90  >90 (mL/min)   GFR calc Af Amer >90  >90 (mL/min)  GLUCOSE, CAPILLARY     Status: Abnormal   Collection Time   04/17/12  6:45 AM      Component Value Range   Glucose-Capillary 164 (*) 70 - 99 (  mg/dL)   Comment 1 Documented in Chart     Comment 2 Notify RN    GLUCOSE, CAPILLARY     Status: Abnormal   Collection Time   04/17/12 11:34 AM      Component Value Range   Glucose-Capillary 127 (*) 70 - 99 (mg/dL)   Comment 1 Notify RN      Micro Results: Recent Results (from the past 240 hour(s))  MRSA PCR SCREENING     Status: Normal   Collection Time   04/14/12 11:16 PM      Component Value Range Status Comment   MRSA by PCR NEGATIVE  NEGATIVE  Final   URINE CULTURE     Status: Normal   Collection Time   04/14/12 11:45 PM      Component Value Range Status Comment   Specimen Description URINE, CLEAN CATCH   Final    Special Requests NONE   Final    Culture  Setup Time [id number]   Final    Colony Count NO GROWTH   Final    Culture NO GROWTH   Final    Report Status 04/16/2012 FINAL   Final   WOUND CULTURE     Status: Normal   Collection Time   04/15/12  3:35 AM      Component Value Range Status Comment   Specimen Description WOUND RIGHT LEG   Final    Special Requests NONE   Final    Gram Stain      Final    Value: RARE WBC PRESENT,BOTH PMN AND MONONUCLEAR     NO SQUAMOUS EPITHELIAL CELLS SEEN     NO ORGANISMS SEEN   Culture     Final    Value: MULTIPLE ORGANISMS PRESENT, NONE PREDOMINANT     Note: NO STAPHYLOCOCCUS AUREUS ISOLATED NO GROUP A STREP (S.PYOGENES) ISOLATED   Report Status 04/17/2012 FINAL   Final     Medications:  Scheduled Meds:   . amLODipine  5 mg Oral Daily  . aspirin EC  81 mg Oral Daily  . docusate sodium  100 mg Oral BID  . doxycycline  100 mg Oral Q12H  . insulin aspart  0-15 Units Subcutaneous TID WC  . insulin aspart  6 Units Subcutaneous TID WC  . insulin glargine  14 Units Subcutaneous QHS  . levETIRAcetam  500 mg Oral BID  . metoprolol tartrate  12.5 mg Oral BID  . mulitivitamin with minerals  1 tablet Oral Daily  . mupirocin ointment   Topical BID  . niacin  250 mg Oral Daily  . pantoprazole  40 mg Oral Q1200  . phenytoin  300 mg Oral QHS  . potassium chloride SA  20 mEq Oral Daily  . senna  1 tablet Oral BID   Continuous Infusions:   . 0.9 % NaCl with KCl 20 mEq / L 75 mL/hr at 04/16/12 1206   PRN Meds:.acetaminophen, acetaminophen, alum & mag hydroxide-simeth, morphine injection, ondansetron (ZOFRAN) IV, ondansetron, oxyCODONE, zolpidem  Assessment/Plan: Rhabdomyolysis with entrapment/stasis injury of R LE  *CPK 7500 at presentation and has now trended down to 4100, *Right lower extremity Doppler studies is negative for DVTs.  *Appreciate wound ostomy nurse assistance  *Felt to have diffuse partial thickness related traumatic wounds sustained after fall and entrapment in recliner chair  *Appreciate orthopedics as well as general surgery consultations  *No evidence of compartment syndrome and no indications for surgical intervention at this time  AKI (acute kidney injury) / Dehydration  *  improved *Precipitated by dehydration and associated dysmobility after fall with entrapment  *Creatinine stable   Leukocytosis - resolved *Secondary  to injury of right lower extremity as well as dehydration and acute kidney injury  *Discontinued IV antibiotics,  on doxycycline for total of 10 days.    DIABETES MELLITUS-TYPE II  *CBG elevated greater than 300, now much improved *continue Lantus and sliding scale insulin   HTN (hypertension)  *Continue Norvasc and metoprolol   Seizure disorder  *Continue Keppra and Dilantin   Disposition  *Pt to rehab when bed available   LOS: 3 days   Aleesha Ringstad 04/17/2012, 12:03 PM  Cleora Fleet, MD, CDE, FAAFP [facility] [place], [place]  [phone]

## 2012-04-17 NOTE — Progress Notes (Signed)
  Subjective:  Feels good, no new complaints. Resting quietly in bed.  Objective: Vital signs in last 24 hours: Temp:  [98.2 F (36.8 C)-98.7 F (37.1 C)] 98.7 F (37.1 C) (06/05 0600) Pulse Rate:  [85-87] 85  (06/05 0600) Resp:  [16-18] 16  (06/05 0600) BP: (145-148)/(67-71) 148/71 mmHg (06/05 0600) SpO2:  [99 %-100 %] 100 % (06/05 0600) Weight:  [209 lb 6.4 oz (94.983 kg)] 209 lb 6.4 oz (94.983 kg) (06/05 0600) Last BM Date: 04/15/12  Intake/Output from previous day: 06/04 0701 - 06/05 0700 In: 1595 [P.O.:720; I.V.:875] Out: 1200 [Urine:1200] Intake/Output this shift:    General appearance: alert, cooperative and no distress Incision/Wound:wound was examined, blisters in the upper inner portion of the thigh, which appear to be larger today compared to yesterday. serous drainage on bandage noted. No increased erythema noted, abrasion wound on inner thigh appears to be healing well.    Lab Results:   Basename 04/15/12 0430 04/14/12 1603  WBC 15.1* 22.9*  HGB 14.1 17.5*  HCT 39.4 48.3  PLT 204 258   BMET  Basename 04/17/12 0535 04/16/12 0612  NA 139 141  K 3.5 3.6  CL 106 107  CO2 25 26  GLUCOSE 165* 161*  BUN 14 18  CREATININE 0.62 0.71  CALCIUM 8.2* 8.1*   PT/INR  Basename 04/15/12 0430  LABPROT 16.0*  INR 1.25   ABG No results found for this basename: PHART:2,PCO2:2,PO2:2,HCO3:2 in the last 72 hours  Studies/Results: No results found.  Anti-infectives: Anti-infectives     Start     Dose/Rate Route Frequency Ordered Stop   04/16/12 1300   doxycycline (VIBRA-TABS) tablet 100 mg        100 mg Oral Every 12 hours 04/16/12 1151     04/15/12 2000   levofloxacin (LEVAQUIN) IVPB 750 mg  Status:  Discontinued        750 mg 100 mL/hr over 90 Minutes Intravenous Every 24 hours 04/14/12 1849 04/16/12 1151   04/15/12 0800   vancomycin (VANCOCIN) 1,250 mg in sodium chloride 0.9 % 250 mL IVPB  Status:  Discontinued        1,250 mg 166.7 mL/hr over 90  Minutes Intravenous Every 12 hours 04/14/12 1849 04/16/12 1151   04/15/12 0200   piperacillin-tazobactam (ZOSYN) IVPB 3.375 g  Status:  Discontinued        3.375 g 12.5 mL/hr over 240 Minutes Intravenous Every 8 hours 04/14/12 1849 04/15/12 1821   04/14/12 1900   vancomycin (VANCOCIN) 1,250 mg in sodium chloride 0.9 % 250 mL IVPB        1,250 mg 166.7 mL/hr over 90 Minutes Intravenous To [facility] 04/14/12 1849 04/14/12 2310   04/14/12 1900  piperacillin-tazobactam (ZOSYN) IVPB 3.375 g       3.375 g 100 mL/hr over 30 Minutes Intravenous [facility] 04/14/12 1849 04/14/12 2129   04/14/12 1900   levofloxacin (LEVAQUIN) IVPB 750 mg        750 mg 100 mL/hr over 90 Minutes Intravenous [facility] 04/14/12 1849 04/14/12 2057          Assessment/Plan: s/p * No surgery found * Cellulitus of right leg with Rhabdomylytis  Plan:  1. Continue with wound care as per wound care recommendations.  2. Continue antibiotics  3.Continue OT/PT    LOS: 3 days    Arthur Berry 04/17/2012

## 2012-04-17 NOTE — Progress Notes (Signed)
CARE MANAGEMENT NOTE 04/17/2012  04/17/12 1100 Vance Peper, RN BSN Case Manager Patient is exploring options of possible SNF or inpt rehab if eligible. Has had multiple falls in past few weeks, concerned he wont be safe at home. Social Lorrin Mais has been notified and has spoken with patient. Will follow.

## 2012-04-17 NOTE — Progress Notes (Signed)
General Surgery Attending note:   Agree with evaluation as dictated by Mr. Golda Acre, NP, [facility].  There does not appear to be and acute surgical need or indication for drainage or debridement.   Plan:   Will sign off.             Defer to Dr. Catalina Antigua. Derrell Lolling, M.D., [facility] General and Minimally invasive Surgery Breast and Colorectal Surgery Office:   [phone] Pager:   [phone]

## 2012-04-17 NOTE — Progress Notes (Signed)
Occupational Therapy Treatment Patient Details Name: Arthur Berry MRN: [id number] DOB: [birth year] Today's Date: 04/17/2012 Time: 2536-6440 OT Time Calculation (min): 39 min  OT Assessment / Plan / Recommendation Comments on Treatment Session Pt. brushed his teeth at sink. He requested to walk down hallway to get icecream. Pt. did great in session and reported no pain.     Follow Up Recommendations  SNF   Barriers to Discharge       Equipment Recommendations  Defer to next venue    Recommendations for Other Services    Frequency Min 2X/week   Plan Discharge plan remains appropriate    Precautions / Restrictions Precautions Precautions: Fall Restrictions Weight Bearing Restrictions: No   Pertinent Vitals/Pain Vitals Monitored and Stable. Pt. Reported no pain.     ADL  Eating/Feeding: Performed;Independent Where Assessed - Eating/Feeding: Bed level Grooming: Performed;Independent;Teeth care Where Assessed - Grooming: Supported standing Toilet Transfer: Simulated;Min Pension scheme manager Method: Sit to stand Equipment Used: Rolling walker;Gait belt ADL Comments: Pt. brushed teeth while standing supported at sink with RW. He ambulated down hall >100 ft. He did not report any pain in session and was motivated to participate to get better.       OT Goals Acute Rehab OT Goals OT Goal Formulation: With patient Time For Goal Achievement: 04/30/12 Potential to Achieve Goals: Good ADL Goals Pt Will Perform Upper Body Bathing: with modified independence;Sitting in shower Pt Will Perform Lower Body Bathing: Sit to stand in shower;with supervision Pt Will Perform Upper Body Dressing: with modified independence;Sitting, bed;Unsupported Pt Will Perform Lower Body Dressing: Sit to stand from bed;Supported;with supervision Pt Will Transfer to Toilet: with modified independence;3-in-1;Stand pivot transfer ADL Goal: Toilet Transfer - Progress: Progressing toward goals Pt Will  Perform Toileting - Clothing Manipulation: with modified independence;Standing Pt Will Perform Toileting - Hygiene: with modified independence;Leaning right and/or left on 3-in-1/toilet  Visit Information  Last OT Received On: 04/17/12 Assistance Needed: +1    Subjective Data      Prior Functioning       Cognition  Overall Cognitive Status: Appears within functional limits for tasks assessed/performed Arousal/Alertness: Awake/alert Orientation Level: Appears intact for tasks assessed Behavior During Session: [facility] for tasks performed    Mobility Bed Mobility Bed Mobility: Rolling Right;Right Sidelying to Sit;Sitting - Scoot to [place] of Bed;Sit to Sidelying Right;Scooting to [facility] Rolling Right: 4: Min assist;With rail Right Sidelying to Sit: 4: Min assist;With rails;HOB flat Sitting - Scoot to [place] of Bed: 4: Min assist;With rail Sit to Sidelying Right: 4: Min guard Scooting to [facility]: With rail;5: Supervision Transfers Transfers: Sit to Stand;Stand to Sit Sit to Stand: With upper extremity assist;4: Min guard;From bed Stand to Sit: 6: Modified independent (Device/Increase time);With upper extremity assist;To bed   Exercises    Balance    End of Session OT - End of Session Activity Tolerance: Patient tolerated treatment well Patient left: in bed;with call bell/phone within reach   Straub, Mardella Layman 04/17/2012, 2:50 PM   Lucile Shutters   OTR/L Pager: [phone] Office: [phone] .

## 2012-04-17 NOTE — Progress Notes (Signed)
Leg improving  Pt ambulating Anticipate oow for 4 w forms filled out Fu 10 days

## 2012-04-18 DIAGNOSIS — E118 Type 2 diabetes mellitus with unspecified complications: Secondary | ICD-10-CM

## 2012-04-18 DIAGNOSIS — S8780XA Crushing injury of unspecified lower leg, initial encounter: Secondary | ICD-10-CM | POA: Diagnosis not present

## 2012-04-18 DIAGNOSIS — M6282 Rhabdomyolysis: Secondary | ICD-10-CM | POA: Diagnosis not present

## 2012-04-18 DIAGNOSIS — L03039 Cellulitis of unspecified toe: Secondary | ICD-10-CM | POA: Diagnosis not present

## 2012-04-18 DIAGNOSIS — I10 Essential (primary) hypertension: Secondary | ICD-10-CM | POA: Diagnosis not present

## 2012-04-18 DIAGNOSIS — E1165 Type 2 diabetes mellitus with hyperglycemia: Secondary | ICD-10-CM | POA: Diagnosis not present

## 2012-04-18 DIAGNOSIS — L02619 Cutaneous abscess of unspecified foot: Secondary | ICD-10-CM

## 2012-04-18 LAB — GLUCOSE, CAPILLARY: Glucose-Capillary: 136 mg/dL — ABNORMAL HIGH (ref 70–99)

## 2012-04-18 MED ORDER — DSS 100 MG PO CAPS: 100.0000 mg | ORAL_CAPSULE | Freq: Two times a day (BID) | ORAL | Status: AC | PRN

## 2012-04-18 MED ORDER — ADULT MULTIVITAMIN W/MINERALS CH: 1.0000 | ORAL_TABLET | Freq: Every day | ORAL | Status: DC

## 2012-04-18 MED ORDER — DOXYCYCLINE HYCLATE 100 MG PO TABS: 100.0000 mg | ORAL_TABLET | Freq: Two times a day (BID) | ORAL | Status: AC

## 2012-04-18 MED ORDER — MUPIROCIN 2 % EX OINT: TOPICAL_OINTMENT | Freq: Two times a day (BID) | CUTANEOUS | Status: AC

## 2012-04-18 MED ORDER — OXYCODONE HCL 5 MG PO TABS: 5.0000 mg | ORAL_TABLET | Freq: Four times a day (QID) | ORAL | Status: AC | PRN

## 2012-04-18 NOTE — Clinical Social Work Psychosocial (Signed)
     Clinical Social Work Department BRIEF PSYCHOSOCIAL ASSESSMENT 04/18/2012  Patient:  Arthur Berry, Arthur Berry     Account Number:  [id number]     Admit date:  04/14/2012  Clinical Social Worker:  Burnard Hawthorne  Date/Time:  04/17/2012 10:15 AM  Referred by:  Care Management  Date Referred:  04/17/2012 Referred for  SNF Placement   Other Referral:   SNF vs home with home care/DME   Interview type:  Patient Other interview type:    PSYCHOSOCIAL DATA Living Status:  ALONE Admitted from facility:   Level of care:   Primary support name:  Sister- Ms. McEntire [phone] Primary support relationship to patient:  SIBLING Degree of support available:   Good support. She is pt's HCPOA;  pt's brother is finanical POA.    CURRENT CONCERNS Current Concerns  Post-Acute Placement   Other Concerns:   Patient is undecided as to what he wants to do- he will talk to his sister and other family this evening and will decide in the a.m.    SOCIAL WORK ASSESSMENT / PLAN Patient referred to CSW for d/c disposition. Original plan was for patient to go home with HH/DME but there is concern over pt's ability to manage safely at home.  Patient stated that he thinks he can go stay with his older sister for about a week but states that she is in poor health (she is 66 years old). Patient also has a friend who is a CNA who he feels he can call on to help at home.  He will consider short term SNF but, as an employee of [employer] he has [employer] with [facility]. He will have a 20% deductible if he goes to SNF. Patient agrees to SNF search- wants to look at [facility].  Search intiated.   Assessment/plan status:  Psychosocial Support/Ongoing Assessment of Needs Other assessment/ plan:   Information/referral to community resources:   SNF List provided  Discussed alternative services such as PACE, Private duty care  ALF list provided for possible future placement  Need for Medicaid and process  discussed with patient and sister    PATIENTS/FAMILYS RESPONSE TO PLAN OF CARE: Patient is very appreciative of CSW visit; states that he will talk to his family tonight and determine best course of action for d/c.

## 2012-04-18 NOTE — Discharge Summary (Signed)
Physician Discharge Summary  Patient ID: Arthur Berry MRN: [id number] DOB/AGE: 08-04-1946 66 y.o.  Admit date: 04/14/2012 Discharge date: 04/18/2012  Admission Diagnoses: Cellulitis of leg, right- .Rhabdomyolysis .Secondary diabetes with hyperglycemia hyperosmolar non-ketotic coma- .Leukocytosis-  .HTN (hypertension) .AKI (acute kidney injury)- due to rhabdomyolysis and dehydration   Discharge Diagnoses:  Principal Problem:  *Rhabdomyolysis Active Problems:  DIABETES MELLITUS-TYPE II  Cellulitis of leg, right  Leukocytosis  HTN (hypertension)  AKI (acute kidney injury)  Dehydration   Discharged Condition: good  Hospital Course:  Rhabdomyolysis with entrapment/stasis injury of R LE  *CPK 7500 at presentation and has now trended down,  *Right lower extremity Doppler studies is negative for DVTs.  *Appreciate wound ostomy nurse assistance  *Felt to have diffuse partial thickness related traumatic wounds sustained after fall and entrapment in recliner chair  *Appreciate orthopedics as well as general surgery consultations  *No evidence of compartment syndrome and no indications for surgical intervention at this time   AKI (acute kidney injury) / Dehydration  *improved and resolved - renal function back to normal *Precipitated by dehydration and associated dysmobility after fall with entrapment  *Creatinine stable   Leukocytosis - resolved  *Secondary to injury of right lower extremity as well as dehydration and acute kidney injury  *Discontinued IV antibiotics, on doxycycline for total of 10 days.   DIABETES MELLITUS-TYPE II  *CBG elevated greater than 300 on admission, now much improved  *treated with  Lantus and sliding scale insulin in hospital, pt is being discharged home on home oral DM medications  HTN (hypertension)  *Continue Norvasc and benazepril as he normally takes at home  Seizure disorder  *Continue Keppra and Dilantin - pt had no seizure activity in  hospital.   Disposition - Pt to go to acute rehab SNF facility.  He has fallen a couple of times in hospital and lives alone with little help.  It is felt that it is safer for him to go to rehab until more physically stable on his feet.      Consults: wound care, orthopedics, gen surgery  Discharge Exam:  Pt reports that he is feeling much better, pain controlled, wound healing well Blood pressure 157/78, pulse 62, temperature 98.2 F (36.8 C), temperature source Oral, resp. rate 18, height 5\' 10"  (1.778 m), weight 95.8 kg (211 lb 3.2 oz), SpO2 100.00%. General appearance: alert, cooperative, appears stated age and no distress  Resp: clear to auscultation bilaterally  Cardio: regular rate and rhythm, S1, S2 normal, no murmur, click, rub or gallop  GI: soft, non-tender; bowel sounds normal; no masses, no organomegaly  Extremities: Right lower extremity much less swollen and bullous lesion on right thigh near groin has burst, but pulses intact and foot is warm, noted with 1+ edema right foot, left lower extremity unremarkable  Neurologic: Grossly normal   Disposition: Skilled Nursing Rehab Facility  Discharge Orders    Future Orders Please Complete By Expires   Increase activity slowly        Medication List  As of 04/18/2012  9:48 AM   TAKE these medications         amLODipine 5 MG tablet   Commonly known as: NORVASC   Take 5 mg by mouth daily.      aspirin EC 81 MG tablet   Take 81 mg by mouth daily.      benazepril 40 MG tablet   Commonly known as: LOTENSIN   Take 40 mg by mouth daily.  cholecalciferol 1000 UNITS tablet   Commonly known as: VITAMIN D   Take 1,000 Units by mouth daily.      doxycycline 100 MG tablet   Commonly known as: VIBRA-TABS   Take 1 tablet (100 mg total) by mouth 2 (two) times daily.      DSS 100 MG Caps   Take 100 mg by mouth 2 (two) times daily as needed for constipation.      Fish Oil Oil   Take 1 tablet by mouth daily.       glyBURIDE-metformin 2.5-500 MG per tablet   Commonly known as: GLUCOVANCE   Take 1 tablet by mouth 2 (two) times daily with a meal.      levETIRAcetam 500 MG tablet   Commonly known as: KEPPRA   Take 500 mg by mouth 2 (two) times daily.      multivitamin with minerals Tabs   Take 1 tablet by mouth daily.      mupirocin ointment 2 %   Commonly known as: BACTROBAN   Apply topically 2 (two) times daily. Apply topically 2 (two) times daily. Apply to Blisters with Dressing Changes Twice Daily on R Leg      niacin 250 MG tablet   Take 250 mg by mouth daily.      oxyCODONE 5 MG immediate release tablet   Commonly known as: Oxy IR/ROXICODONE   Take 1 tablet (5 mg total) by mouth every 6 (six) hours as needed for pain.      phenytoin 100 MG ER capsule   Commonly known as: DILANTIN   Take 300 mg by mouth at bedtime.      potassium chloride SA 20 MEQ tablet   Commonly known as: K-DUR,KLOR-CON   Take 20 mEq by mouth daily.           Follow-up Information    Follow up with [name], MD. Schedule an appointment as soon as possible for a visit in 8 days. (hospital follow up)    Contact information:   [facility] [address] [place] [address] [phone]       Follow up with [name], MD. Schedule an appointment as soon as possible for a visit in 1 week. (hospital follow up)    Contact information:   [address] [facility] [place] [address] [phone]         I spent 40 mins preparing discharge summary, reviewing labs, consult note, records, writing prescriptions, dictating summary.    Signed: [name] 04/18/2012, 9:48 AM

## 2012-04-18 NOTE — ED Provider Notes (Signed)
Medical screening examination/treatment/procedure(s) were conducted as a shared visit with non-physician practitioner(s) and myself.  I personally evaluated the patient during the encounter   Dione Booze, MD 04/18/12 [phone]

## 2012-04-18 NOTE — Discharge Instructions (Signed)
Please check blood sugars 3 times per day and as needed Change dressings Bactroban to R groin linear wound; all areas covered with petroleum gauze, wrapped in kerlex & secured with ACE 2 times per day and keep wound clean   Rhabdomyolysis Rhabdomyolysis is the breakdown of muscle fibers due to injury. The injury may come from physical damage to the muscle like an injury but other causes are:  High fever (hyperthermia).   Seizures (convulsions).   Low phosphate levels.   Diseases of metabolism.   Heatstroke.   Drug toxicity.   Over exertion.   Alcoholism.   Muscle is cut off from oxygen (anoxia).   The squeezing of nerves and blood vessels (compartment syndrome).  Some drugs which may cause the breakdown of muscle are:  Antibiotics.   Statins.   Alcohol.   Animal toxins.  Myoglobin is a substance which helps muscle use oxygen. When the muscle is damaged, the myoglobin is released into the bloodstream. It is filtered out of the bloodstream by the kidneys. Myoglobin may block up the kidneys. This may cause damage, such as kidney failure. It also breaks down into other damaging toxic parts, which also cause kidney failure.  SYMPTOMS   Dark, red, or tea colored urine.   Weakness of affected muscles.   Weight gain from water retention.   Joint aches and pains.   Irregular heart from high potassium in the blood.   Muscle tenderness or aching.   Generalized weakness.   Seizures.   Feeling tired (fatigue).  DIAGNOSIS  Your caregiver may find muscle tenderness on exam and suspect the problem. Urine tests and blood work can confirm the problem. TREATMENT   Early and aggressive treatment with large amounts of fluids may help prevent kidney failure.   Water producing medicine (diuretic) may be used to help flush the kidneys.   High potassium and calcium problems (electrolyte) in your blood may need treatment.  HOME CARE INSTRUCTIONS  This problem is usually  cared for in a hospital. If you are allowed to go home and require dialysis, make sure you keep all appointments for lab work and dialysis. Not doing so could result in death. Document Released: 10/20/2004 Document Revised: 10/19/2011 Document Reviewed: 04/26/2009 [facility] Patient Information 2012 [place], [place]. Rhabdomyolysis Rhabdomyolysis is the breakdown of muscle fibers due to injury. The injury may come from physical damage to the muscle like an injury but other causes are:  High fever (hyperthermia).   Seizures (convulsions).   Low phosphate levels.   Diseases of metabolism.   Heatstroke.   Drug toxicity.   Over exertion.   Alcoholism.   Muscle is cut off from oxygen (anoxia).   The squeezing of nerves and blood vessels (compartment syndrome).  Some drugs which may cause the breakdown of muscle are:  Antibiotics.   Statins.   Alcohol.   Animal toxins.  Myoglobin is a substance which helps muscle use oxygen. When the muscle is damaged, the myoglobin is released into the bloodstream. It is filtered out of the bloodstream by the kidneys. Myoglobin may block up the kidneys. This may cause damage, such as kidney failure. It also breaks down into other damaging toxic parts, which also cause kidney failure.  SYMPTOMS   Dark, red, or tea colored urine.   Weakness of affected muscles.   Weight gain from water retention.   Joint aches and pains.   Irregular heart from high potassium in the blood.   Muscle tenderness or aching.   Generalized  weakness.   Seizures.   Feeling tired (fatigue).  DIAGNOSIS  Your caregiver may find muscle tenderness on exam and suspect the problem. Urine tests and blood work can confirm the problem. TREATMENT   Early and aggressive treatment with large amounts of fluids may help prevent kidney failure.   Water producing medicine (diuretic) may be used to help flush the kidneys.   High potassium and calcium problems  (electrolyte) in your blood may need treatment.  HOME CARE INSTRUCTIONS  This problem is usually cared for in a hospital. If you are allowed to go home and require dialysis, make sure you keep all appointments for lab work and dialysis. Not doing so could result in death. Document Released: 18-Oct-2004 Document Revised: 10/19/2011 Document Reviewed: 04/26/2009 [facility] Patient Information 2012 [place], [place].

## 2012-04-18 NOTE — Clinical Social Work Placement (Signed)
     Clinical Social Work Department CLINICAL SOCIAL WORK PLACEMENT NOTE 04/18/2012  Patient:  Arthur Berry, Arthur Berry  Account Number:  [id number] Admit date:  04/14/2012  Clinical Social Worker:  Lupita Leash Creedon Danielski, BSW  Date/time:  04/17/2012 02:00 PM  Clinical Social Work is seeking post-discharge placement for this patient at the following level of care:   SKILLED NURSING   (*CSW will update this form in Epic as items are completed)   04/17/2012  Patient/family provided with [facility] Department of Clinical Social Works list of facilities offering this level of care within the geographic area requested by the patient (or if unable, by the patients family).  04/17/2012  Patient/family informed of their freedom to choose among providers that offer the needed level of care, that participate in Medicare, Medicaid or managed care program needed by the patient, have an available bed and are willing to accept the patient.  04/17/2012  Patient/family informed of [facility] ownership interest in [facility], as well as of the fact that they are under no obligation to receive care at this facility.  PASARR submitted to EDS on 04/17/2012 PASARR number received from EDS on 04/18/2012  FL2 transmitted to all facilities in geographic area requested by pt/family on  04/17/2012 FL2 transmitted to all facilities within larger geographic area on   Patient informed that his/her managed care company has contracts with or will negotiate with  certain facilities, including the following:   Patient has UMR coverage through his employement with [employer].  He requested placement at [facility]- spoke with facility- they would accept [facility] insurance.  Thus- bed search focused on [facility].     Patient/family informed of bed offers received:  04/17/2012 Patient chooses bed at [facility] [place] Physician recommends and patient chooses bed at    [facility] [place] on  04/18/2012 Patient to be transferred to facility by [facility]  The following physician request were entered in Epic:   Additional Comments: Patient and sister are very pleased with DC plan. Notified UMR coordinator- Heather  [phone] of d/c plan. Also notified SNF and pt's nurse of d/c plan.

## 2012-04-18 NOTE — Progress Notes (Signed)
Pt followed for progression of care as a benefit of the UMR/[employer] insurance plan. D/C plan discussed with the pt. It appears that he is too high functioning to participate in inpatient rehab. He states that he has elderly family members that could supervise him with visits in his home, but would not be able to do much hands on care and he lives alone. His multiple falls while inpatient and at home were discussed with him as a safety issue and he has already considered short term SNF care. He stated a preference for [facility] in [place] for SNF. Case discussed with Lovette Cliche, SW to facilitate d/c as needed. UMR contact information given to Lupita Leash. Will follow up with the pt at the SNF in the community for continued progression of care. Livia Snellen, RN, CCM, Assistant Clinical Director/Hospital Liaison, [facility] Care Management program, # [phone].

## 2012-04-25 DIAGNOSIS — S7000XA Contusion of unspecified hip, initial encounter: Secondary | ICD-10-CM | POA: Diagnosis not present

## 2012-04-25 DIAGNOSIS — S8000XA Contusion of unspecified knee, initial encounter: Secondary | ICD-10-CM | POA: Diagnosis not present

## 2012-05-09 DIAGNOSIS — S8000XA Contusion of unspecified knee, initial encounter: Secondary | ICD-10-CM | POA: Diagnosis not present

## 2012-05-09 DIAGNOSIS — S7000XA Contusion of unspecified hip, initial encounter: Secondary | ICD-10-CM | POA: Diagnosis not present

## 2012-05-09 DIAGNOSIS — S8780XA Crushing injury of unspecified lower leg, initial encounter: Secondary | ICD-10-CM | POA: Diagnosis not present

## 2012-06-07 DIAGNOSIS — S8000XA Contusion of unspecified knee, initial encounter: Secondary | ICD-10-CM | POA: Diagnosis not present

## 2012-06-07 DIAGNOSIS — S7000XA Contusion of unspecified hip, initial encounter: Secondary | ICD-10-CM | POA: Diagnosis not present

## 2012-07-03 ENCOUNTER — Other Ambulatory Visit (HOSPITAL_COMMUNITY): Payer: Self-pay | Admitting: Internal Medicine

## 2012-07-03 DIAGNOSIS — R4182 Altered mental status, unspecified: Secondary | ICD-10-CM

## 2012-07-04 ENCOUNTER — Ambulatory Visit (HOSPITAL_COMMUNITY)
Admission: RE | Admit: 2012-07-04 | Discharge: 2012-07-04 | Disposition: A | Discharge status: Home / Self Care | Payer: 59 | Source: Ambulatory Visit | Attending: Internal Medicine | Admitting: Internal Medicine

## 2012-07-04 DIAGNOSIS — G319 Degenerative disease of nervous system, unspecified: Secondary | ICD-10-CM | POA: Insufficient documentation

## 2012-07-04 DIAGNOSIS — R4182 Altered mental status, unspecified: Secondary | ICD-10-CM | POA: Diagnosis not present

## 2012-07-04 DIAGNOSIS — E119 Type 2 diabetes mellitus without complications: Secondary | ICD-10-CM | POA: Insufficient documentation

## 2012-07-04 DIAGNOSIS — N189 Chronic kidney disease, unspecified: Secondary | ICD-10-CM | POA: Insufficient documentation

## 2012-07-04 DIAGNOSIS — I129 Hypertensive chronic kidney disease with stage 1 through stage 4 chronic kidney disease, or unspecified chronic kidney disease: Secondary | ICD-10-CM | POA: Insufficient documentation

## 2012-07-04 DIAGNOSIS — R569 Unspecified convulsions: Secondary | ICD-10-CM | POA: Insufficient documentation

## 2012-07-04 DIAGNOSIS — G930 Cerebral cysts: Secondary | ICD-10-CM | POA: Insufficient documentation

## 2012-07-04 DIAGNOSIS — Q043 Other reduction deformities of brain: Secondary | ICD-10-CM | POA: Insufficient documentation

## 2012-07-04 DIAGNOSIS — G9389 Other specified disorders of brain: Secondary | ICD-10-CM | POA: Diagnosis not present

## 2012-07-04 DIAGNOSIS — M4802 Spinal stenosis, cervical region: Secondary | ICD-10-CM | POA: Insufficient documentation

## 2012-07-24 DIAGNOSIS — R569 Unspecified convulsions: Secondary | ICD-10-CM | POA: Diagnosis not present

## 2012-07-31 DIAGNOSIS — G4733 Obstructive sleep apnea (adult) (pediatric): Secondary | ICD-10-CM | POA: Diagnosis not present

## 2012-07-31 DIAGNOSIS — G4700 Insomnia, unspecified: Secondary | ICD-10-CM | POA: Diagnosis not present

## 2012-07-31 DIAGNOSIS — R569 Unspecified convulsions: Secondary | ICD-10-CM | POA: Diagnosis not present

## 2012-07-31 DIAGNOSIS — G4710 Hypersomnia, unspecified: Secondary | ICD-10-CM | POA: Diagnosis not present

## 2012-08-14 DIAGNOSIS — S7000XA Contusion of unspecified hip, initial encounter: Secondary | ICD-10-CM | POA: Diagnosis not present

## 2012-08-14 DIAGNOSIS — S8780XA Crushing injury of unspecified lower leg, initial encounter: Secondary | ICD-10-CM | POA: Diagnosis not present

## 2012-08-14 DIAGNOSIS — M25559 Pain in unspecified hip: Secondary | ICD-10-CM | POA: Diagnosis not present

## 2012-08-14 DIAGNOSIS — S8000XA Contusion of unspecified knee, initial encounter: Secondary | ICD-10-CM | POA: Diagnosis not present

## 2012-09-04 ENCOUNTER — Encounter: Payer: Self-pay | Admitting: Internal Medicine

## 2012-09-04 ENCOUNTER — Ambulatory Visit (INDEPENDENT_AMBULATORY_CARE_PROVIDER_SITE_OTHER): Payer: Medicare Other | Admitting: Internal Medicine

## 2012-09-04 VITALS — BP 146/80 | HR 80 | Ht 68.25 in | Wt 201.1 lb

## 2012-09-04 DIAGNOSIS — E119 Type 2 diabetes mellitus without complications: Secondary | ICD-10-CM | POA: Diagnosis not present

## 2012-09-04 DIAGNOSIS — R1084 Generalized abdominal pain: Secondary | ICD-10-CM

## 2012-09-04 MED ORDER — OMEPRAZOLE 20 MG PO CPDR: 20.0000 mg | DELAYED_RELEASE_CAPSULE | Freq: Every day | ORAL | Status: DC

## 2012-09-04 NOTE — Progress Notes (Signed)
HISTORY OF PRESENT ILLNESS:  Arthur Berry is a 66 y.o. male with multiple medical problems as listed below. He has been seen in this office for colon polyps, chronic abdominal bloating with constipation, and cholecystitis for which she underwent cholecystectomy. He was last seen in the office January 2012 regarding bloating. See that dictation for details. His last colonoscopy was February 2011 revealing mild sigmoid diverticulosis and internal hemorrhoids only. Followup in 5 years because of a history of adenomatous polyps (tubular adenoma 10 mm on colonoscopy 2005) recommended. He has had interval problems with kidney stones requiring surgery. His last upper endoscopy January 2012 was normal. He was continued on PPI and symptoms improved. Biopsies of the small bowel were normal. His chief complaint today is that of the abdominal pain described as tearing. He states that this has been occurring intermittently for 2-3 months. It occurs most days and generally lasts between 15 and 30 minutes. He will occasionally he is the discomfort. He denies nausea, vomiting, weight loss, bleeding, new medications, change in diet, or change in bowel habits. No change in symptoms with defecation. He denies that the discomfort isn't all similar to his biliary stone pain. He occasionally is awoken at night with the discomfort.  REVIEW OF SYSTEMS:  All non-GI ROS negative except for arthritis, leg pain  Past Medical History  Diagnosis Date  . Colon polyp     Tubular Adenoma  . Diabetes mellitus 2001  . Hypertension 1999  . Arthritis   . Hyperlipidemia 1999  . Epilepsy   . Sleep apnea   . Angina   . Shortness of breath   . Chronic kidney disease   . Neuromuscular disorder     Past Surgical History  Procedure Date  . Knee surgery 2008    Left  . Cholecystectomy   . Inguinal hernia repair 06/27/11    left    Social History Arthur Berry  reports that he quit smoking about 26 years ago. His smoking use  included Cigars. He has never used smokeless tobacco. He reports that he does not drink alcohol or use illicit drugs.  family history includes Breast cancer in his sister; Colon cancer in an unspecified family member; and Diabetes in his sister.  No Known Allergies     PHYSICAL EXAMINATION: Vital signs: BP 146/80  Pulse 80  Ht 5' 8.25" (1.734 m)  Wt 201 lb 2 oz (91.23 kg)  BMI 30.36 kg/m2  Constitutional: generally well-appearing, no acute distress Psychiatric: alert and oriented x3, cooperative Eyes: extraocular movements intact, anicteric, conjunctiva pink Mouth: oral pharynx moist, no lesions Neck: supple no lymphadenopathy Cardiovascular: heart regular rate and rhythm, no murmur Lungs: clear to auscultation bilaterally Abdomen: soft, nontender, nondistended, no obvious ascites, no peritoneal signs, normal bowel sounds, no organomegaly Rectal:omitted Extremities: no lower extremity edema bilaterally Skin: no lesions on visible extremities Neuro: No focal deficits.   ASSESSMENT:  #1. Chronic abdominal pain. Etiology unclear. Suspect functional #2. History of bloating and constipation #3. History of adenomatous colon polyps. Last colonoscopy 2011 #4. Multiple medical problems including diabetes  PLAN:  #1. Prescribe omeprazole 20 mg daily #2. Schedule diagnostic upper endoscopy.The nature of the procedure, as well as the risks, benefits, and alternatives were carefully and thoroughly reviewed with the patient. Ample time for discussion and questions allowed. The patient understood, was satisfied, and agreed to proceed.  #3. Hold diabetic medications the day of the examination until by mouth intake resumed.

## 2012-09-04 NOTE — Patient Instructions (Addendum)
You have been scheduled for an endoscopy with propofol. Please follow written instructions given to you at your visit today. If you use inhalers (even only as needed), please bring them with you on the day of your procedure.  We have sent the following medications to your pharmacy for you to pick up at your convenience:  Omeprazole

## 2012-09-10 ENCOUNTER — Encounter: Payer: Medicare Other | Admitting: Internal Medicine

## 2012-10-07 DIAGNOSIS — E119 Type 2 diabetes mellitus without complications: Secondary | ICD-10-CM | POA: Diagnosis not present

## 2012-10-07 DIAGNOSIS — R569 Unspecified convulsions: Secondary | ICD-10-CM | POA: Diagnosis not present

## 2012-10-07 DIAGNOSIS — F329 Major depressive disorder, single episode, unspecified: Secondary | ICD-10-CM | POA: Diagnosis not present

## 2012-10-07 DIAGNOSIS — R109 Unspecified abdominal pain: Secondary | ICD-10-CM | POA: Diagnosis not present

## 2012-10-07 DIAGNOSIS — I10 Essential (primary) hypertension: Secondary | ICD-10-CM | POA: Diagnosis not present

## 2012-10-14 ENCOUNTER — Ambulatory Visit (AMBULATORY_SURGERY_CENTER): Payer: Medicare Other | Admitting: Internal Medicine

## 2012-10-14 ENCOUNTER — Encounter: Payer: Self-pay | Admitting: Internal Medicine

## 2012-10-14 VITALS — BP 149/89 | HR 75 | Temp 98.3°F | Resp 15 | Ht 68.0 in | Wt 201.0 lb

## 2012-10-14 DIAGNOSIS — K219 Gastro-esophageal reflux disease without esophagitis: Secondary | ICD-10-CM | POA: Diagnosis not present

## 2012-10-14 DIAGNOSIS — R1084 Generalized abdominal pain: Secondary | ICD-10-CM

## 2012-10-14 DIAGNOSIS — R109 Unspecified abdominal pain: Secondary | ICD-10-CM | POA: Diagnosis not present

## 2012-10-14 HISTORY — PX: ESOPHAGOGASTRODUODENOSCOPY: SHX1529

## 2012-10-14 LAB — GLUCOSE, CAPILLARY: Glucose-Capillary: 101 mg/dL — ABNORMAL HIGH (ref 70–99)

## 2012-10-14 MED ORDER — SODIUM CHLORIDE 0.9 % IV SOLN: 500.0000 mL | INTRAVENOUS | Status: DC

## 2012-10-14 NOTE — Op Note (Signed)
[facility] [address] [place], [address]   ENDOSCOPY PROCEDURE REPORT  PATIENT: Arthur, Berry  MR#: [id number] BIRTHDATE: Feb 28, 1946 , 66  yrs. old GENDER: Male ENDOSCOPIST: Roxy Cedar, MD REFERRED BY:  .  Self / Office PROCEDURE DATE:  10/14/2012 PROCEDURE:  EGD, diagnostic ASA CLASS:     Class II INDICATIONS:  abdominal pain. MEDICATIONS: MAC sedation, administered by CRNA and propofol (Diprivan) 180mg  IV TOPICAL ANESTHETIC: none  DESCRIPTION OF PROCEDURE: After the risks benefits and alternatives of the procedure were thoroughly explained, informed consent was obtained.  The LB GIF-H180 [id number] endoscope was introduced through the mouth and advanced to the second portion of the duodenum. Without limitations.  The instrument was slowly withdrawn as the mucosa was fully examined.      The proximal and midesophagus appeared normal.  the distal esophagus revealed mild esophagitis as manifested by friability and edema at the mucosal Z line.  the stomach, including retroflexed view, was normal.  the duodenal bulb and post bulbar duodenum were normal. The proximal and midesophagus appeared normal.  the distal esophagus revealed mild esophagitis as manifested by friability and edema at the mucosal Z line.  the stomach, including retroflexed view, was normal.  the duodenal bulb and post bulbar duodenum were normal.         The scope was then withdrawn from the patient and the procedure completed.  COMPLICATIONS: There were no complications. ENDOSCOPIC IMPRESSION: 1.  Reflux esophagitis , mild 2. Otherwise normal exam  RECOMMENDATIONS: 1.  Anti-reflux regimen to be followed 2.  Continue PPI daily (Omeprazole)  REPEAT EXAM:  eSigned:  Roxy Cedar, MD 10/14/2012 3:39 PM   AO:ZHYQ Perini, MD and The Patient

## 2012-10-14 NOTE — Progress Notes (Signed)
#  24 inserted by T. Ennis RN left inner wrist.  One attempt right wrist unsuccessful by Ardeen Jourdain RN

## 2012-10-14 NOTE — Progress Notes (Signed)
Patient did not experience any of the following events: a burn prior to discharge; a fall within the facility; wrong site/side/patient/procedure/implant event; or a hospital transfer or hospital admission upon discharge from the facility. (G8907) Patient did not have preoperative order for IV antibiotic SSI prophylaxis. (G8918)  

## 2012-10-14 NOTE — Patient Instructions (Addendum)
YOU HAD AN ENDOSCOPIC PROCEDURE TODAY AT THE [facility]: Refer to the procedure report that was given to you for any specific questions about what was found during the examination.  If the procedure report does not answer your questions, please call your gastroenterologist to clarify.  If you requested that your care partner not be given the details of your procedure findings, then the procedure report has been included in a sealed envelope for you to review at your convenience later.  YOU SHOULD EXPECT: Some feelings of bloating in the abdomen. Passage of more gas than usual.  Walking can help get rid of the air that was put into your GI tract during the procedure and reduce the bloating.  DIET: Your first meal following the procedure should be a light meal and then it is ok to progress to your normal diet.  A half-sandwich or bowl of soup is an example of a good first meal.  Heavy or fried foods are harder to digest and may make you feel nauseous or bloated.  Likewise meals heavy in dairy and vegetables can cause extra gas to form and this can also increase the bloating.  Drink plenty of fluids but you should avoid alcoholic beverages for 24 hours.  ACTIVITY: Your care partner should take you home directly after the procedure.  You should plan to take it easy, moving slowly for the rest of the day.  You can resume normal activity the day after the procedure however you should NOT DRIVE or use heavy machinery for 24 hours (because of the sedation medicines used during the test).    SYMPTOMS TO REPORT IMMEDIATELY: A gastroenterologist can be reached at any hour.  During normal business hours, 8:30 AM to 5:00 PM Monday through Friday, call [phone].  After hours and on weekends, please call the GI answering service at [phone] who will take a message and have the physician on call contact you.   Following upper endoscopy (EGD)  Vomiting of blood or coffee ground material  New  chest pain or pain under the shoulder blades  Painful or persistently difficult swallowing  New shortness of breath  Fever of 100F or higher  Black, tarry-looking stools  FOLLOW UP: Our staff will call the home number listed on your records the next business day following your procedure to check on you and address any questions or concerns that you may have at that time regarding the information given to you following your procedure. This is a courtesy call and so if there is no answer at the home number and we have not heard from you through the emergency physician on call, we will assume that you have returned to your regular daily activities without incident.  SIGNATURES/CONFIDENTIALITY: You and/or your care partner have signed paperwork which will be entered into your electronic medical record.  These signatures attest to the fact that that the information above on your After Visit Summary has been reviewed and is understood.  Full responsibility of the confidentiality of this discharge information lies with you and/or your care-partner.   RESUME YOUR NORMAL MEDICATIONS

## 2012-10-15 ENCOUNTER — Telehealth: Payer: Self-pay | Admitting: *Deleted

## 2012-10-15 LAB — GLUCOSE, CAPILLARY: Glucose-Capillary: 104 mg/dL — ABNORMAL HIGH (ref 70–99)

## 2012-10-15 NOTE — Telephone Encounter (Signed)
NO ANSWER, MESSAGE LEFT FOR THE PATIENT. 

## 2012-10-29 DIAGNOSIS — Z23 Encounter for immunization: Secondary | ICD-10-CM | POA: Diagnosis not present

## 2012-12-10 ENCOUNTER — Emergency Department (HOSPITAL_COMMUNITY): Payer: Medicare Other

## 2012-12-10 ENCOUNTER — Inpatient Hospital Stay (HOSPITAL_COMMUNITY)
Admission: EM | Admit: 2012-12-10 | Discharge: 2012-12-12 | DRG: 204 | Disposition: A | Discharge status: Home / Self Care | Payer: Medicare Other | Attending: Emergency Medicine | Admitting: Emergency Medicine

## 2012-12-10 ENCOUNTER — Encounter (HOSPITAL_COMMUNITY): Payer: Self-pay | Admitting: Emergency Medicine

## 2012-12-10 DIAGNOSIS — G4730 Sleep apnea, unspecified: Secondary | ICD-10-CM | POA: Diagnosis not present

## 2012-12-10 DIAGNOSIS — R079 Chest pain, unspecified: Secondary | ICD-10-CM | POA: Diagnosis not present

## 2012-12-10 DIAGNOSIS — Z79899 Other long term (current) drug therapy: Secondary | ICD-10-CM

## 2012-12-10 DIAGNOSIS — E876 Hypokalemia: Secondary | ICD-10-CM | POA: Diagnosis not present

## 2012-12-10 DIAGNOSIS — Z860100 Personal history of colon polyps, unspecified: Secondary | ICD-10-CM

## 2012-12-10 DIAGNOSIS — R071 Chest pain on breathing: Principal | ICD-10-CM | POA: Diagnosis present

## 2012-12-10 DIAGNOSIS — I2510 Atherosclerotic heart disease of native coronary artery without angina pectoris: Secondary | ICD-10-CM | POA: Diagnosis present

## 2012-12-10 DIAGNOSIS — K209 Esophagitis, unspecified without bleeding: Secondary | ICD-10-CM | POA: Diagnosis present

## 2012-12-10 DIAGNOSIS — E042 Nontoxic multinodular goiter: Secondary | ICD-10-CM | POA: Diagnosis present

## 2012-12-10 DIAGNOSIS — R072 Precordial pain: Secondary | ICD-10-CM | POA: Diagnosis present

## 2012-12-10 DIAGNOSIS — Z23 Encounter for immunization: Secondary | ICD-10-CM

## 2012-12-10 DIAGNOSIS — E119 Type 2 diabetes mellitus without complications: Secondary | ICD-10-CM | POA: Diagnosis present

## 2012-12-10 DIAGNOSIS — Z7982 Long term (current) use of aspirin: Secondary | ICD-10-CM

## 2012-12-10 DIAGNOSIS — I10 Essential (primary) hypertension: Secondary | ICD-10-CM | POA: Diagnosis not present

## 2012-12-10 DIAGNOSIS — R05 Cough: Secondary | ICD-10-CM | POA: Diagnosis not present

## 2012-12-10 DIAGNOSIS — E785 Hyperlipidemia, unspecified: Secondary | ICD-10-CM | POA: Diagnosis present

## 2012-12-10 DIAGNOSIS — G40909 Epilepsy, unspecified, not intractable, without status epilepticus: Secondary | ICD-10-CM | POA: Diagnosis present

## 2012-12-10 HISTORY — DX: Nontoxic multinodular goiter: E04.2

## 2012-12-10 HISTORY — DX: Atherosclerotic heart disease of native coronary artery without angina pectoris: I25.10

## 2012-12-10 LAB — GLUCOSE, CAPILLARY
Glucose-Capillary: 104 mg/dL — ABNORMAL HIGH (ref 70–99)
Glucose-Capillary: 215 mg/dL — ABNORMAL HIGH (ref 70–99)
Glucose-Capillary: 105 mg/dL — ABNORMAL HIGH (ref 70–99)

## 2012-12-10 LAB — CBC WITH DIFFERENTIAL/PLATELET
Basophils Absolute: 0.1 10*3/uL (ref 0.0–0.1)
Basophils Relative: 1 % (ref 0–1)
HCT: 39.1 % (ref 39.0–52.0)
Hemoglobin: 14 g/dL (ref 13.0–17.0)
Lymphocytes Relative: 24 % (ref 12–46)
MCHC: 35.8 g/dL (ref 30.0–36.0)
Monocytes Absolute: 0.9 10*3/uL (ref 0.1–1.0)
Monocytes Relative: 9 % (ref 3–12)
Neutro Abs: 6.2 10*3/uL (ref 1.7–7.7)
Neutrophils Relative %: 60 % (ref 43–77)
RDW: 13.5 % (ref 11.5–15.5)
WBC: 10.3 10*3/uL (ref 4.0–10.5)

## 2012-12-10 LAB — COMPREHENSIVE METABOLIC PANEL
ALT: 15 U/L (ref 0–53)
AST: 15 U/L (ref 0–37)
CO2: 30 mEq/L (ref 19–32)
Chloride: 101 mEq/L (ref 96–112)
Creatinine, Ser: 0.77 mg/dL (ref 0.50–1.35)
GFR calc non Af Amer: >90 mL/min (ref >90)
Glucose, Bld: 138 mg/dL — ABNORMAL HIGH (ref 70–99)
Total Bilirubin: 0.8 mg/dL (ref 0.3–1.2)

## 2012-12-10 LAB — CBC
HCT: 39 % (ref 39.0–52.0)
Hemoglobin: 14.1 g/dL (ref 13.0–17.0)
MCV: 86.9 fL (ref 78.0–100.0)
RDW: 13.4 % (ref 11.5–15.5)
WBC: 9 10*3/uL (ref 4.0–10.5)

## 2012-12-10 LAB — CK TOTAL AND CKMB (NOT AT ARMC)
CK, MB: 2.3 ng/mL (ref 0.3–4.0)
Relative Index: INVALID (ref 0.0–2.5)
Total CK: 62 U/L (ref 7–232)

## 2012-12-10 LAB — HEMOGLOBIN A1C: Mean Plasma Glucose: 154 mg/dL — ABNORMAL HIGH (ref <117)

## 2012-12-10 LAB — CREATININE, SERUM: GFR calc Af Amer: >90 mL/min (ref >90)

## 2012-12-10 MED ORDER — SODIUM CHLORIDE 0.9 % IV SOLN: 250.0000 mL | INTRAVENOUS | Status: DC | PRN

## 2012-12-10 MED ORDER — SODIUM CHLORIDE 0.9 % IJ SOLN: 3.0000 mL | INTRAMUSCULAR | Status: DC | PRN

## 2012-12-10 MED ORDER — NITROGLYCERIN 0.4 MG SL SUBL: 0.4000 mg | SUBLINGUAL_TABLET | SUBLINGUAL | Status: DC | PRN

## 2012-12-10 MED ORDER — ACETAMINOPHEN 325 MG PO TABS
650.0000 mg | ORAL_TABLET | ORAL | Status: DC | PRN
  Administered 2012-12-11: 650 mg via ORAL
  Filled 2012-12-10: qty 2

## 2012-12-10 MED ORDER — ASPIRIN 81 MG PO CHEW
CHEWABLE_TABLET | ORAL | Status: AC
  Filled 2012-12-10: qty 4

## 2012-12-10 MED ORDER — SODIUM CHLORIDE 0.9 % IV SOLN
INTRAVENOUS | Status: DC
  Administered 2012-12-10: 07:00:00 via INTRAVENOUS

## 2012-12-10 MED ORDER — PNEUMOCOCCAL VAC POLYVALENT 25 MCG/0.5ML IJ INJ
0.5000 mL | INJECTION | INTRAMUSCULAR | Status: AC
  Administered 2012-12-11: 0.5 mL via INTRAMUSCULAR
  Filled 2012-12-10: qty 0.5

## 2012-12-10 MED ORDER — ONDANSETRON HCL 4 MG/2ML IJ SOLN: 4.0000 mg | Freq: Four times a day (QID) | INTRAMUSCULAR | Status: DC | PRN

## 2012-12-10 MED ORDER — SODIUM CHLORIDE 0.9 % IV SOLN
INTRAVENOUS | Status: DC
  Administered 2012-12-11: 04:00:00 via INTRAVENOUS

## 2012-12-10 MED ORDER — INSULIN ASPART 100 UNIT/ML ~~LOC~~ SOLN: 0.0000 [IU] | Freq: Every day | SUBCUTANEOUS | Status: DC

## 2012-12-10 MED ORDER — INSULIN ASPART 100 UNIT/ML ~~LOC~~ SOLN
0.0000 [IU] | Freq: Three times a day (TID) | SUBCUTANEOUS | Status: DC
  Administered 2012-12-10: 5 [IU] via SUBCUTANEOUS
  Administered 2012-12-11 – 2012-12-12 (×3): 2 [IU] via SUBCUTANEOUS

## 2012-12-10 MED ORDER — SODIUM CHLORIDE 0.9 % IJ SOLN: 3.0000 mL | Freq: Two times a day (BID) | INTRAMUSCULAR | Status: DC

## 2012-12-10 MED ORDER — ZOLPIDEM TARTRATE 5 MG PO TABS
5.0000 mg | ORAL_TABLET | Freq: Every evening | ORAL | Status: DC | PRN
  Administered 2012-12-10: 5 mg via ORAL
  Filled 2012-12-10: qty 1

## 2012-12-10 MED ORDER — SODIUM CHLORIDE 0.9 % IJ SOLN
3.0000 mL | Freq: Two times a day (BID) | INTRAMUSCULAR | Status: DC
  Administered 2012-12-10: 3 mL via INTRAVENOUS

## 2012-12-10 MED ORDER — ASPIRIN 81 MG PO CHEW
324.0000 mg | CHEWABLE_TABLET | ORAL | Status: AC
  Administered 2012-12-10: 324 mg via ORAL

## 2012-12-10 MED ORDER — ENOXAPARIN SODIUM 40 MG/0.4ML ~~LOC~~ SOLN
40.0000 mg | SUBCUTANEOUS | Status: DC
  Administered 2012-12-10: 40 mg via SUBCUTANEOUS
  Filled 2012-12-10 (×3): qty 0.4

## 2012-12-10 MED ORDER — ALPRAZOLAM 0.25 MG PO TABS
0.2500 mg | ORAL_TABLET | Freq: Two times a day (BID) | ORAL | Status: DC | PRN
  Administered 2012-12-11: 0.25 mg via ORAL
  Filled 2012-12-10: qty 1

## 2012-12-10 MED ORDER — SALINE SPRAY 0.65 % NA SOLN
1.0000 | NASAL | Status: DC | PRN
  Filled 2012-12-10 (×2): qty 44

## 2012-12-10 MED ORDER — NITROGLYCERIN 0.4 MG SL SUBL
0.4000 mg | SUBLINGUAL_TABLET | SUBLINGUAL | Status: AC | PRN
  Administered 2012-12-10 (×3): 0.4 mg via SUBLINGUAL
  Filled 2012-12-10: qty 25

## 2012-12-10 MED ORDER — POTASSIUM CHLORIDE CRYS ER 20 MEQ PO TBCR
40.0000 meq | EXTENDED_RELEASE_TABLET | Freq: Once | ORAL | Status: AC
  Administered 2012-12-10: 40 meq via ORAL
  Filled 2012-12-10: qty 2

## 2012-12-10 NOTE — ED Notes (Signed)
PA at bedside assessing patient.

## 2012-12-10 NOTE — Consult Note (Signed)
CARDIOLOGY CONSULT NOTE   Patient ID: Arthur Berry MRN: [id number] DOB/AGE: May 26, 1946 67 y.o.  Admit date: 12/10/2012  Primary Physician   Arthur Kayser, Berry Primary Cardiologist   Dr. Antoine Berry Reason for Consultation   Chest pain  HPI: Arthur Berry is a 67 year old male with past history of HTN, HL, DM, CKD, sleep apnea, and epilepsy who presents to the ED with chest pain. He has had some intermittent chest pain at rest for the past month. It occurred a few times and the last episode was 2 weeks ago. He rates the pain at a 5/10 and it is a throbbing sensation. He denies feeling SOB or other associated symptoms with the chest pain. This morning at 4 am he was wakened by throbbing chest pain, rated 10/10, so he called EMS.  He was given ASA 324 mg by EMS and then was given SL nitroglycerin x 3 in the ED, which decreased his pain. He rates his current pain at a 2/10 and a throbbing sensation. He denies radiation, worsening with inspiration, or worsening of pain on palpation. He denies palpitations, SOB, diaphoresis, nausea, vomiting, dizziness, and lightheadedness.  His sister-in-law stepped out to let me know he seems to be unhappy with his living situation. She states he calls more often and she believes he is trying to do what he can to get out of his house. He told her that there are funny smells, which she and her husband were unable to appreciate. He also has been having some abdominal discomfort, but the gastroenterologist hasn't found etiology of chronic abdominal pain. He was prescribed some omeprazole for mild esophagitis. He denies any abdominal complaints on questioning.  Past Medical History  Diagnosis Date  . Colon polyp     Tubular Adenoma  . Diabetes mellitus 2001  . Hypertension 1999  . Arthritis   . Hyperlipidemia 1999  . Epilepsy   . Sleep apnea   . Angina   . Shortness of breath   . Chronic kidney disease - BUN/Cr currently normal   . Neuromuscular disorder       Past Surgical History  Procedure Date  . Knee surgery 2008    Left  . Cholecystectomy   . Inguinal hernia repair 06/27/11    left    No Known Allergies  I have reviewed the patient's current medications      . sodium chloride 20 mL/hr at 12/10/12 0646     Prior to Admission medications   Medication Sig Start Date End Date Taking? Authorizing Berry  amLODipine (NORVASC) 5 MG tablet Take 5 mg by mouth daily.   Yes Arthur Berry  aspirin EC 81 MG tablet Take 81 mg by mouth daily.   Yes Arthur Berry  benazepril (LOTENSIN) 40 MG tablet Take 40 mg by mouth daily.     Yes Arthur Berry  cholecalciferol (VITAMIN D) 1000 UNITS tablet Take 1,000 Units by mouth daily.     Yes Arthur Berry  Cyanocobalamin (VITAMIN B-12 CR PO) Take 1 tablet by mouth daily.   Yes Arthur Berry  glyBURIDE-metformin (GLUCOVANCE) 2.5-500 MG per tablet Take 1 tablet by mouth 2 (two) times daily with a meal.   Yes Arthur Berry  levETIRAcetam (KEPPRA) 500 MG tablet Take 500 mg by mouth 2 (two) times daily.     Yes Arthur Berry  Multiple Vitamin (MULTIVITAMIN WITH MINERALS) TABS Take 1 tablet by mouth daily. 04/18/12  Yes Arthur L Laural Benes,  Berry  niacin 250 MG tablet Take 250 mg by mouth daily.     Yes Arthur Berry  omeprazole (PRILOSEC) 20 MG capsule Take 1 capsule (20 mg total) by mouth daily. 09/04/12  Yes Arthur Fredrickson, Berry  phenytoin (DILANTIN) 100 MG ER capsule Take 300 mg by mouth at bedtime.    Yes Arthur Berry  potassium chloride SA (K-DUR,KLOR-CON) 20 MEQ tablet Take 20 mEq by mouth daily.    Yes Arthur Berry     History   Social History  . Marital Status: Divorced    Spouse Name: N/A    Number of Children: 0  . Years of Education: N/A   Occupational History  . Retired Medical laboratory scientific officer [employer]   Social History Main Topics  . Smoking status: Former Smoker    Types: Cigars    Quit  date: 04/14/1986  . Smokeless tobacco: Never Used     Comment: Quit cigars in the 1980s  . Alcohol Use: No  . Drug Use: No  . Sexually Active: No   Social History Narrative  . Pt lives alone, has Life-alert.     Family History  Problem Relation Age of Onset  . Breast cancer Sister   . Colon cancer      unknown  . Diabetes Sister      ROS: Full 14 point review of systems complete and found to be negative unless listed above.  Physical Exam: Blood pressure 128/71, pulse 65, temperature 98.1 F (36.7 C), temperature source Oral, resp. rate 13, SpO2 100.00%.  General: Well developed, well nourished, male in no acute distress Head: Eyes PERRLA, No xanthomas.   Normocephalic and atraumatic, oropharynx without edema or exudate. Dentition fair. Lungs: Respirations unlabored. CTA bilaterally. Heart: HRRR S1 S2, no rub/gallop. Pulses are 2+ all 4 extremities. Neck: No carotid bruits. No lymphadenopathy. No JVD. Abdomen: Bowel sounds present, abdomen soft and non-tender without masses or hernias noted. Msk:  No spine or cva tenderness. No weakness, no joint deformities or effusions. Extremities: No clubbing or cyanosis. The right lower leg is slightly larger than the left leg. I could not see any true pitting edema. He has evidence of a previous injury. Neuro: Alert and oriented X 3. No focal deficits noted. Psych:  Good affect, responds appropriately. Skin: No rashes or lesions noted.  Labs:   Lab Results  Component Value Date   WBC 10.3 12/10/2012   HGB 14.0 12/10/2012   HCT 39.1 12/10/2012   MCV 85.7 12/10/2012   PLT 157 12/10/2012     Lab 12/10/12 0643  NA 142  K 3.1*  CL 101  CO2 30  BUN 9  CREATININE 0.77  CALCIUM 9.5  PROT 7.6  BILITOT 0.8  ALKPHOS 115  ALT 15  AST 15  GLUCOSE 138*   No results found for this basename: CKTOTAL:4,CKMB:4,TROPONINI:4 in the last 72 hours  Basename 12/10/12 0650  TROPIPOC 0.00   Lab Results  Component Value Date   CHOL 156  04/15/2012   HDL 45 04/15/2012   LDLCALC 79 04/15/2012   TRIG 161* 04/15/2012   ECG:   10-Dec-2012 06:19:13  AGE IS NOT ENTERED, ASSUMED TO BE 67 YEARS OLD FOR PURPOSE OF ECG INTERPRETATION SINUS RHYTHM ~ normal P axis, V-rate 50- 99 Standard 12 Lead Report ~ Unconfirmed Interpretation Normal ECG 55mm/s 61mm/mV 150Hz  8.0.1 12SL 235 CID: [id number] Referred by: Unconfirmed Vent. rate 75 BPM PR interval 160 ms QRS duration 92 ms QT/QTc 404/451 ms  P-R-T axes 72 63 52  Radiology:  Dg Chest 2 View 12/10/2012  *RADIOLOGY REPORT*  Clinical Data: Chest pain, hypertension, sleep apnea  CHEST - 2 VIEW  Comparison: 04/14/2012; 07/17/2011; 05/15/2008  Findings: Grossly unchanged cardiac silhouette and mediastinal contours.  There is persistent rightward deviation of the tracheal air column at the level of the thoracic inlet, similar to the 06/2008 examination.  Unchanged left basilar heterogeneous opacities.  No new focal airspace opacities favored to represent atelectasis or scar.  No new focal airspace opacities.  No pleural effusion or pneumothorax.  There is multilevel flowing osteophytosis within the thoracic spine.  No acute osseous abnormalities.  Post cholecystectomy.  IMPRESSION: 1.  Left basilar atelectasis/scar without acute cardiopulmonary disease. 2.  Chronic rightward deviation of the tracheal air column, similar to the 2009 examination and possibly attributable to an enlarged left lobe of the thyroid.  Further evaluation with dedicated thyroid ultrasound may be performed as clinically indicated.   Original Report Authenticated By: Tacey Ruiz, Berry     ASSESSMENT AND PLAN:    Primary problem: 1. Chest pain - Pain mostly relieved with ASA and SL nitroglycerin. Troponin negative x 1. EKG shows NSR and rate 75 bpm. ECG slightly abnormal but is unchanged from ECG dating back to 2011. Admit and continue cycling enzymes. Get CK-MB as well x 1.  2. Hypokalemia - K 3.1 this am. K-dur 40 mEq, recheck in am,  may need to increase daily dose to 30 meq.  3. DIABETES MELLITUS-TYPE II - hold metformin, SSI, ck A1C  4.  DYSLIPIDEMIA - continue niacin, ck profile  5. Essential hypertension, benign - continue home Rx, add low-dose BB.    Signed: Caroline More PA-S2 12/10/2012, 9:49 AM Co-Sign Berry  Seen and agree with changes made. Theodore Demark, PA 12/10/2012  9:49 AM  Attending Note:   The patient was seen and examined.  Agree with assessment and plan as noted above. Changes are made as needed.  Sam was a previous employee of [employer]. He worked at the [employer] outside [employer]  He presents today with episodes of chest pressure and chest heaviness that woke him up from sleep. The pain lasted for about 30-45 minutes and was finally eased significantly when he received nitroglycerin in the emergency room. He still has slight 2/10 chest pain.  His exam is basically unremarkable and is noted above.  His EKG is mildly abnormal but he has had these diagnoses for the past several years. He had a stress test performed in 2011 which did not reveal any ischemia.  I would favor doing a cardiac catheterization for further evaluation. We will try to arrange that for today. We'll keep him n.p.o. Otherwise we can do this tomorrow morning.  Vesta Mixer, Montez Hageman., Berry, [facility] 12/10/2012, 10:10 AM

## 2012-12-10 NOTE — ED Notes (Signed)
Per EMS, patient complaining of left sided chest pain.  Describes chest pain as "sharp"; denies radiation of pain.  Denies associated symptoms included shortness of breath, nausea, vomiting, and diaphoresis.  History of Hypertension and Hyperlipidemia.  NSR on monitor.

## 2012-12-10 NOTE — ED Provider Notes (Signed)
Medical screening examination/treatment/procedure(s) were performed by non-physician practitioner and as supervising physician I was immediately available for consultation/collaboration.  Daphene Chisholm M Deretha Ertle, MD 12/10/12 1529 

## 2012-12-10 NOTE — ED Notes (Signed)
Cardiac arrhythmia  noted on the cardiac monitor. Strips printed and given to EDP for evaluation. Cardiology to see the pt.

## 2012-12-10 NOTE — Progress Notes (Signed)
Utilization review completed.  P.J. Amandalee Lacap,RN,BSN Case Manager [phone]  

## 2012-12-10 NOTE — ED Notes (Signed)
Patient complaining of left sided chest pain that started around 0600.  Patient reports that he has had chest pain in the past, but that it has not felt as bad at this.  Denies any associated symptoms, including shortness of breath, nausea, vomiting, diaphoresis, and radiation of pain.  Rates pain 4/10 on the numerical pain scale at this time; describes pain as "pressure".  Patient alert and oriented x4; PERRL present.  Upon arrival to room, patient connected to continuous cardiac, pulse ox, and blood pressure monitor.  Will continue to monitor.

## 2012-12-10 NOTE — ED Provider Notes (Signed)
History     CSN: [id number]  Arrival date & time 12/10/12  0607   First MD Initiated Contact with Patient 12/10/12 [phone]      Chief Complaint  Patient presents with  . Chest Pain    (Consider location/radiation/quality/duration/timing/severity/associated sxs/prior treatment) HPI  Arthur Berry is a 67 y.o. male c/o left chest pain, non-radiating described as sharp, rated at moderate at worst, 5/10 now. Pain started x2.5hours ago while pt was sleeping. Pain has been constant, non-exertional, non-pleuritic or positional. Denies SOB, N/V, diaphoresis, fever, back pain, syncope, prior similar episodes. Dry cough x1 week. Denies recent travel, leg swelling, hemoptysis.  Had ASA en route.   RF:  DM, hyperlipidemia, hypertension, former smoker, age. Cath: ?  Last Stress test: x66yr Cardiologost: Hochrein @ [facility] PCP: Perrini   Past Medical History  Diagnosis Date  . Colon polyp     Tubular Adenoma  . Diabetes mellitus 2001  . Hypertension 1999  . Arthritis   . Hyperlipidemia 1999  . Epilepsy   . Sleep apnea   . Angina   . Shortness of breath   . Chronic kidney disease   . Neuromuscular disorder     Past Surgical History  Procedure Date  . Knee surgery 2008    Left  . Cholecystectomy   . Inguinal hernia repair 06/27/11    left    Family History  Problem Relation Age of Onset  . Breast cancer Sister   . Colon cancer      unknown  . Diabetes Sister     History  Substance Use Topics  . Smoking status: Former Smoker    Types: Cigars    Quit date: 04/14/1986  . Smokeless tobacco: Never Used     Comment: Quit cigars in the 1980s  . Alcohol Use: No      Review of Systems  Constitutional: Negative for fever.  Respiratory: Positive for cough. Negative for shortness of breath.   Cardiovascular: Positive for chest pain.  Gastrointestinal: Negative for nausea, vomiting, abdominal pain and diarrhea.  All other systems reviewed and are negative.    Allergies    Review of patient's allergies indicates no known allergies.  Home Medications   Current Outpatient Rx  Name  Route  Sig  Dispense  Refill  . AMLODIPINE BESYLATE 5 MG PO TABS   Oral   Take 5 mg by mouth daily.         . ASPIRIN EC 81 MG PO TBEC   Oral   Take 81 mg by mouth daily.         Marland Kitchen BENAZEPRIL HCL 40 MG PO TABS   Oral   Take 40 mg by mouth daily.           Marland Kitchen VITAMIN D 1000 UNITS PO TABS   Oral   Take 1,000 Units by mouth daily.           Marland Kitchen VITAMIN B-12 CR PO   Oral   Take 1 tablet by mouth daily.         . GLYBURIDE-METFORMIN 2.5-500 MG PO TABS   Oral   Take 1 tablet by mouth 2 (two) times daily with a meal.         . LEVETIRACETAM 500 MG PO TABS   Oral   Take 500 mg by mouth 2 (two) times daily.           . ADULT MULTIVITAMIN W/MINERALS CH   Oral   Take 1 tablet  by mouth daily.   30 tablet   0   . NIACIN 250 MG PO TABS   Oral   Take 250 mg by mouth daily.           Marland Kitchen OMEPRAZOLE 20 MG PO CPDR   Oral   Take 1 capsule (20 mg total) by mouth daily.   30 capsule   11   . PHENYTOIN SODIUM EXTENDED 100 MG PO CAPS   Oral   Take 300 mg by mouth at bedtime.          Marland Kitchen POTASSIUM CHLORIDE CRYS ER 20 MEQ PO TBCR   Oral   Take 20 mEq by mouth daily.            BP 128/71  Pulse 65  Temp 98.1 F (36.7 C) (Oral)  Resp 13  SpO2 100%  Physical Exam  Nursing note and vitals reviewed. Constitutional: He is oriented to person, place, and time. He appears well-developed and well-nourished. No distress.  HENT:  Head: Normocephalic.  Mouth/Throat: Oropharynx is clear and moist.  Eyes: Conjunctivae normal and EOM are normal. Pupils are equal, round, and reactive to light.  Neck: Normal range of motion. No JVD present.  Cardiovascular: Normal rate, regular rhythm, normal heart sounds and intact distal pulses.   Pulmonary/Chest: Effort normal and breath sounds normal. No stridor. No respiratory distress. He has no wheezes. He has no rales.  He exhibits no tenderness.  Abdominal: Soft. Bowel sounds are normal. He exhibits no distension and no mass. There is no tenderness. There is no rebound and no guarding.  Musculoskeletal: Normal range of motion. He exhibits no edema.  Neurological: He is alert and oriented to person, place, and time.  Skin: Skin is warm.  Psychiatric: He has a normal mood and affect.    ED Course  Procedures (including critical care time)  Labs Reviewed  CBC WITH DIFFERENTIAL - Abnormal; Notable for the following:    Eosinophils Relative 6 (*)     All other components within normal limits  COMPREHENSIVE METABOLIC PANEL - Abnormal; Notable for the following:    Potassium 3.1 (*)     Glucose, Bld 138 (*)     All other components within normal limits  POCT I-STAT TROPONIN I   Dg Chest 2 View  12/10/2012  *RADIOLOGY REPORT*  Clinical Data: Chest pain, hypertension, sleep apnea  CHEST - 2 VIEW  Comparison: 04/14/2012; 07/17/2011; 05/15/2008  Findings: Grossly unchanged cardiac silhouette and mediastinal contours.  There is persistent rightward deviation of the tracheal air column at the level of the thoracic inlet, similar to the 06/2008 examination.  Unchanged left basilar heterogeneous opacities.  No new focal airspace opacities favored to represent atelectasis or scar.  No new focal airspace opacities.  No pleural effusion or pneumothorax.  There is multilevel flowing osteophytosis within the thoracic spine.  No acute osseous abnormalities.  Post cholecystectomy.  IMPRESSION: 1.  Left basilar atelectasis/scar without acute cardiopulmonary disease. 2.  Chronic rightward deviation of the tracheal air column, similar to the 2009 examination and possibly attributable to an enlarged left lobe of the thyroid.  Further evaluation with dedicated thyroid ultrasound may be performed as clinically indicated.   Original Report Authenticated By: Tacey Ruiz, MD      Date: 12/10/2012  Rate: 75  Rhythm: normal sinus  rhythm  QRS Axis: normal  Intervals: normal  ST/T Wave abnormalities: ST elevations diffusely  Conduction Disutrbances:none  Narrative Interpretation: Diffuse ST depressions with no reciprocal  changes, unchanged from prior.  Old EKG Reviewed: unchanged  7:10 AM Pain is 2/10 after 3x SL NTG  8:25 AM patient was noted to have a run of asymptomatic nonsustained V. tach    1. Chest pain   2. Essential hypertension, benign       MDM  EKG is nonischemic and troponin is negative. Chest x-ray shows no acute abnormalities however they do note an enlarged left lobe thyroid. Blood work is normal.  Multiple cardiac risk factors. [facility] to admit.        Wynetta Emery, PA-C 12/10/12 1155

## 2012-12-11 ENCOUNTER — Observation Stay (HOSPITAL_COMMUNITY): Payer: Medicare Other

## 2012-12-11 ENCOUNTER — Encounter (HOSPITAL_COMMUNITY): Admission: EM | Disposition: A | Discharge status: Home / Self Care | Payer: Self-pay | Source: Home / Self Care

## 2012-12-11 DIAGNOSIS — Z23 Encounter for immunization: Secondary | ICD-10-CM | POA: Diagnosis not present

## 2012-12-11 DIAGNOSIS — R071 Chest pain on breathing: Secondary | ICD-10-CM | POA: Diagnosis present

## 2012-12-11 DIAGNOSIS — R072 Precordial pain: Secondary | ICD-10-CM | POA: Diagnosis not present

## 2012-12-11 DIAGNOSIS — E042 Nontoxic multinodular goiter: Secondary | ICD-10-CM | POA: Diagnosis present

## 2012-12-11 DIAGNOSIS — R079 Chest pain, unspecified: Secondary | ICD-10-CM | POA: Diagnosis not present

## 2012-12-11 DIAGNOSIS — Z79899 Other long term (current) drug therapy: Secondary | ICD-10-CM | POA: Diagnosis not present

## 2012-12-11 DIAGNOSIS — E876 Hypokalemia: Secondary | ICD-10-CM | POA: Diagnosis not present

## 2012-12-11 DIAGNOSIS — G40909 Epilepsy, unspecified, not intractable, without status epilepticus: Secondary | ICD-10-CM | POA: Diagnosis present

## 2012-12-11 DIAGNOSIS — Z7982 Long term (current) use of aspirin: Secondary | ICD-10-CM | POA: Diagnosis not present

## 2012-12-11 DIAGNOSIS — E785 Hyperlipidemia, unspecified: Secondary | ICD-10-CM

## 2012-12-11 DIAGNOSIS — K209 Esophagitis, unspecified without bleeding: Secondary | ICD-10-CM | POA: Diagnosis present

## 2012-12-11 DIAGNOSIS — I10 Essential (primary) hypertension: Secondary | ICD-10-CM

## 2012-12-11 DIAGNOSIS — Z86010 Personal history of colonic polyps: Secondary | ICD-10-CM | POA: Diagnosis not present

## 2012-12-11 DIAGNOSIS — E119 Type 2 diabetes mellitus without complications: Secondary | ICD-10-CM

## 2012-12-11 DIAGNOSIS — G4730 Sleep apnea, unspecified: Secondary | ICD-10-CM | POA: Diagnosis present

## 2012-12-11 DIAGNOSIS — I2510 Atherosclerotic heart disease of native coronary artery without angina pectoris: Secondary | ICD-10-CM | POA: Diagnosis present

## 2012-12-11 HISTORY — PX: LEFT HEART CATHETERIZATION WITH CORONARY ANGIOGRAM: SHX5451

## 2012-12-11 LAB — CBC
Hemoglobin: 14.1 g/dL (ref 13.0–17.0)
MCH: 30.6 pg (ref 26.0–34.0)
Platelets: 145 10*3/uL — ABNORMAL LOW (ref 150–400)
RBC: 4.61 MIL/uL (ref 4.22–5.81)
WBC: 8.8 10*3/uL (ref 4.0–10.5)

## 2012-12-11 LAB — BASIC METABOLIC PANEL
BUN: 8 mg/dL (ref 6–23)
CO2: 28 mEq/L (ref 19–32)
GFR calc Af Amer: >90 mL/min (ref >90)
GFR calc non Af Amer: >90 mL/min (ref >90)
GFR calc non Af Amer: >90 mL/min (ref >90)
Glucose, Bld: 121 mg/dL — ABNORMAL HIGH (ref 70–99)
Glucose, Bld: 112 mg/dL — ABNORMAL HIGH (ref 70–99)
Potassium: 2.8 mEq/L — ABNORMAL LOW (ref 3.5–5.1)
Potassium: 3 mEq/L — ABNORMAL LOW (ref 3.5–5.1)
Sodium: 142 mEq/L (ref 135–145)

## 2012-12-11 LAB — GLUCOSE, CAPILLARY
Glucose-Capillary: 99 mg/dL (ref 70–99)
Glucose-Capillary: 148 mg/dL — ABNORMAL HIGH (ref 70–99)

## 2012-12-11 LAB — CREATININE, SERUM
Creatinine, Ser: 0.61 mg/dL (ref 0.50–1.35)
GFR calc non Af Amer: >90 mL/min (ref >90)

## 2012-12-11 LAB — PROTIME-INR
INR: 1.15 (ref 0.00–1.49)
Prothrombin Time: 14.5 seconds (ref 11.6–15.2)

## 2012-12-11 SURGERY — LEFT HEART CATHETERIZATION WITH CORONARY ANGIOGRAM
Anesthesia: LOCAL

## 2012-12-11 MED ORDER — POTASSIUM CHLORIDE CRYS ER 20 MEQ PO TBCR
40.0000 meq | EXTENDED_RELEASE_TABLET | Freq: Once | ORAL | Status: AC
  Administered 2012-12-11: 40 meq via ORAL
  Filled 2012-12-11: qty 2

## 2012-12-11 MED ORDER — SODIUM CHLORIDE 0.9 % IJ SOLN: 3.0000 mL | Freq: Two times a day (BID) | INTRAMUSCULAR | Status: DC

## 2012-12-11 MED ORDER — BENAZEPRIL HCL 40 MG PO TABS
40.0000 mg | ORAL_TABLET | Freq: Every day | ORAL | Status: DC
  Administered 2012-12-11 – 2012-12-12 (×2): 40 mg via ORAL
  Filled 2012-12-11 (×2): qty 1

## 2012-12-11 MED ORDER — ACETAMINOPHEN 325 MG PO TABS: 650.0000 mg | ORAL_TABLET | ORAL | Status: DC | PRN

## 2012-12-11 MED ORDER — ATORVASTATIN CALCIUM 40 MG PO TABS
40.0000 mg | ORAL_TABLET | Freq: Every day | ORAL | Status: DC
  Administered 2012-12-11: 40 mg via ORAL
  Filled 2012-12-11 (×3): qty 1

## 2012-12-11 MED ORDER — BENAZEPRIL HCL 40 MG PO TABS: 40.0000 mg | ORAL_TABLET | Freq: Every day | ORAL | Status: DC

## 2012-12-11 MED ORDER — POTASSIUM CHLORIDE CRYS ER 20 MEQ PO TBCR
40.0000 meq | EXTENDED_RELEASE_TABLET | ORAL | Status: AC
  Administered 2012-12-11 (×2): 40 meq via ORAL
  Filled 2012-12-11 (×2): qty 2

## 2012-12-11 MED ORDER — SODIUM CHLORIDE 0.9 % IV SOLN
1.0000 mL/kg/h | INTRAVENOUS | Status: AC
  Administered 2012-12-11: 1 mL/kg/h via INTRAVENOUS

## 2012-12-11 MED ORDER — AMLODIPINE BESYLATE 5 MG PO TABS
5.0000 mg | ORAL_TABLET | Freq: Every day | ORAL | Status: DC
  Administered 2012-12-11 – 2012-12-12 (×2): 5 mg via ORAL
  Filled 2012-12-11 (×2): qty 1

## 2012-12-11 MED ORDER — SODIUM CHLORIDE 0.9 % IV SOLN: 250.0000 mL | INTRAVENOUS | Status: DC | PRN

## 2012-12-11 MED ORDER — NIACIN 250 MG PO TABS
250.0000 mg | ORAL_TABLET | Freq: Every day | ORAL | Status: DC
  Administered 2012-12-12: 11:00:00 250 mg via ORAL
  Filled 2012-12-11 (×2): qty 1

## 2012-12-11 MED ORDER — PANTOPRAZOLE SODIUM 40 MG PO TBEC
40.0000 mg | DELAYED_RELEASE_TABLET | Freq: Every day | ORAL | Status: DC
  Administered 2012-12-11 – 2012-12-12 (×2): 40 mg via ORAL
  Filled 2012-12-11 (×2): qty 1

## 2012-12-11 MED ORDER — FENTANYL CITRATE 0.05 MG/ML IJ SOLN
INTRAMUSCULAR | Status: AC
  Filled 2012-12-11: qty 2

## 2012-12-11 MED ORDER — PHENYTOIN SODIUM EXTENDED 100 MG PO CAPS
300.0000 mg | ORAL_CAPSULE | Freq: Every day | ORAL | Status: DC
  Administered 2012-12-11: 23:00:00 300 mg via ORAL
  Filled 2012-12-11 (×2): qty 3

## 2012-12-11 MED ORDER — MIDAZOLAM HCL 2 MG/2ML IJ SOLN
INTRAMUSCULAR | Status: AC
  Filled 2012-12-11: qty 2

## 2012-12-11 MED ORDER — HEPARIN (PORCINE) IN NACL 2-0.9 UNIT/ML-% IJ SOLN
INTRAMUSCULAR | Status: AC
  Filled 2012-12-11: qty 1000

## 2012-12-11 MED ORDER — ASPIRIN 81 MG PO CHEW
CHEWABLE_TABLET | ORAL | Status: AC
  Filled 2012-12-11: qty 4

## 2012-12-11 MED ORDER — POTASSIUM CHLORIDE CRYS ER 20 MEQ PO TBCR
20.0000 meq | EXTENDED_RELEASE_TABLET | Freq: Every day | ORAL | Status: DC
  Administered 2012-12-11 – 2012-12-12 (×2): 20 meq via ORAL
  Filled 2012-12-11 (×2): qty 1

## 2012-12-11 MED ORDER — SODIUM CHLORIDE 0.9 % IJ SOLN: 3.0000 mL | INTRAMUSCULAR | Status: DC | PRN

## 2012-12-11 MED ORDER — ONDANSETRON HCL 4 MG/2ML IJ SOLN: 4.0000 mg | Freq: Four times a day (QID) | INTRAMUSCULAR | Status: DC | PRN

## 2012-12-11 MED ORDER — LIDOCAINE HCL (PF) 1 % IJ SOLN
INTRAMUSCULAR | Status: AC
  Filled 2012-12-11: qty 30

## 2012-12-11 MED ORDER — ASPIRIN EC 81 MG PO TBEC
81.0000 mg | DELAYED_RELEASE_TABLET | Freq: Every day | ORAL | Status: DC
  Administered 2012-12-12: 11:00:00 81 mg via ORAL
  Filled 2012-12-11 (×2): qty 1

## 2012-12-11 MED ORDER — LEVETIRACETAM 500 MG PO TABS
500.0000 mg | ORAL_TABLET | Freq: Two times a day (BID) | ORAL | Status: DC
  Administered 2012-12-11 – 2012-12-12 (×2): 500 mg via ORAL
  Filled 2012-12-11 (×3): qty 1

## 2012-12-11 MED ORDER — ASPIRIN 81 MG PO CHEW
324.0000 mg | CHEWABLE_TABLET | ORAL | Status: AC
  Administered 2012-12-11: 324 mg via ORAL

## 2012-12-11 MED ORDER — NITROGLYCERIN 0.2 MG/ML ON CALL CATH LAB
INTRAVENOUS | Status: AC
  Filled 2012-12-11: qty 1

## 2012-12-11 NOTE — Interval H&P Note (Signed)
History and Physical Interval Note:  12/11/2012 3:48 PM  Arthur Berry  has presented today for surgery, with the diagnosis of cp  The various methods of treatment have been discussed with the patient and family. After consideration of risks, benefits and other options for treatment, the patient has consented to  Procedure(s) (LRB) with comments: LEFT HEART CATHETERIZATION WITH CORONARY ANGIOGRAM (N/A) as a surgical intervention .  The patient's history has been reviewed, patient examined, no change in status, stable for surgery.  I have reviewed the patient's chart and labs.  Questions were answered to the patient's satisfaction.     Tonny Bollman

## 2012-12-11 NOTE — CV Procedure (Signed)
   Cardiac Catheterization Procedure Note  Name: Arthur Berry MRN: [id number] DOB: 1946-05-07  Procedure: Left Heart Cath, Selective Coronary Angiography, LV angiography  Indication: Chest pain   Procedural Details: The right wrist was prepped, draped, and anesthetized with 1% lidocaine. Using the modified Seldinger technique, a 5 French sheath was introduced into the right radial artery. 3 mg of verapamil was administered through the sheath, weight-based unfractionated heparin was administered intravenously. Standard Judkins catheters were used for selective coronary angiography and left ventriculography. An AR-1 catheter was used for right coronary artery angiography Catheter exchanges were performed over an exchange length guidewire. There were no immediate procedural complications. A TR band was used for radial hemostasis at the completion of the procedure.  The patient was transferred to the post catheterization recovery area for further monitoring.  Procedural Findings: Hemodynamics: AO 156/79 LV 157/15  Coronary angiography: Coronary dominance: right  Left mainstem: Widely patent without obstructive disease.  Left anterior descending (LAD): The LAD is patent throughout its course. There is no significant obstructive disease. The vessel reaches the left ventricular apex. The diagonal branches are patent without significant disease.  Left circumflex (LCx): The left circumflex gives off a large intermediate branch. There is no significant stenosis noted. The AV groove circumflex supplies 2 obtuse marginal branches without significant stenoses.  Right coronary artery (RCA): This is a large, dominant vessel. The mid vessel has minor irregularity with up to 20% stenosis. The PDA and PLA branches are patent without significant stenosis.  Left ventriculography: Left ventricular systolic function is at the lower limits of normal, LVEF is estimated at 55%, there is no significant mitral  regurgitation   Final Conclusions:   1. Minimal nonobstructive coronary artery disease 2. Normal left ventricular systolic function  Recommendations: Suspect noncardiac chest pain.  Tonny Bollman 12/11/2012, 4:41 PM

## 2012-12-11 NOTE — Progress Notes (Signed)
Patient is for cath today, at 1:30pm to 2:30pm , patient was due for SQ Lovenox , spoke to [facility], [place] who stated to hold 1400 dose of Lovenox today.

## 2012-12-11 NOTE — Progress Notes (Signed)
SUBJECTIVE:  Denies any chest pain this am.  No SOB.     PHYSICAL EXAM Filed Vitals:   12/10/12 1000 12/10/12 1243 12/10/12 2100 12/11/12 0520  BP: 126/77 134/72 135/72 150/74  Pulse: 66 57 65 69  Temp:  97.7 F (36.5 C) 98.2 F (36.8 C) 98.6 F (37 C)  TempSrc:  Oral Oral   Resp:  16 18 18   Height:  5\' 10"  (1.778 m)    Weight:  197 lb 15.6 oz (89.8 kg)  198 lb (89.812 kg)  SpO2: 99% 99% 97% 96%   General:  No disttress HEENT:  PERRL Lungs:  Clear Heart:  RRR Abdomen:  Positive bowel sounds, no rebound no guarding Extremities:  No edema Neuro:  Nonfocal.  LABS: Lab Results  Component Value Date   CKTOTAL 62 12/10/2012   CKMB 2.3 12/10/2012   TROPONINI <0.30 12/11/2012   Results for orders placed during the hospital encounter of 12/10/12 (from the past 24 hour(s))  HEMOGLOBIN A1C     Status: Abnormal   Collection Time   12/10/12 10:15 AM      Component Value Range   Hemoglobin A1C 7.0 (*) <5.7 %   Mean Plasma Glucose 154 (*) <117 mg/dL  CK TOTAL AND CKMB     Status: Normal   Collection Time   12/10/12 10:25 AM      Component Value Range   Total CK 62  7 - 232 U/L   CK, MB 2.3  0.3 - 4.0 ng/mL   Relative Index RELATIVE INDEX IS INVALID  0.0 - 2.5  GLUCOSE, CAPILLARY     Status: Abnormal   Collection Time   12/10/12  1:01 PM      Component Value Range   Glucose-Capillary 104 (*) 70 - 99 mg/dL  TROPONIN I     Status: Normal   Collection Time   12/10/12  1:36 PM      Component Value Range   Troponin I <0.30  <0.30 ng/mL  CBC     Status: Abnormal   Collection Time   12/10/12  1:36 PM      Component Value Range   WBC 9.0  4.0 - 10.5 K/uL   RBC 4.49  4.22 - 5.81 MIL/uL   Hemoglobin 14.1  13.0 - 17.0 g/dL   HCT 24.4  01.0 - 27.2 %   MCV 86.9  78.0 - 100.0 fL   MCH 31.4  26.0 - 34.0 pg   MCHC 36.2 (*) 30.0 - 36.0 g/dL   RDW 53.6  64.4 - 03.4 %   Platelets 166  150 - 400 K/uL  CREATININE, SERUM     Status: Normal   Collection Time   12/10/12  1:36 PM   Component Value Range   Creatinine, Ser 0.74  0.50 - 1.35 mg/dL   GFR calc non Af Amer >90  >90 mL/min   GFR calc Af Amer >90  >90 mL/min  GLUCOSE, CAPILLARY     Status: Abnormal   Collection Time   12/10/12  4:34 PM      Component Value Range   Glucose-Capillary 215 (*) 70 - 99 mg/dL  TROPONIN I     Status: Normal   Collection Time   12/10/12  7:20 PM      Component Value Range   Troponin I <0.30  <0.30 ng/mL  GLUCOSE, CAPILLARY     Status: Abnormal   Collection Time   12/10/12  8:52 PM  Component Value Range   Glucose-Capillary 105 (*) 70 - 99 mg/dL   Comment 1 Notify RN    TROPONIN I     Status: Normal   Collection Time   12/11/12 12:13 AM      Component Value Range   Troponin I <0.30  <0.30 ng/mL  PROTIME-INR     Status: Normal   Collection Time   12/11/12 12:13 AM      Component Value Range   Prothrombin Time 14.5  11.6 - 15.2 seconds   INR 1.15  0.00 - 1.49  BASIC METABOLIC PANEL     Status: Abnormal   Collection Time   12/11/12  6:05 AM      Component Value Range   Sodium 142  135 - 145 mEq/L   Potassium 2.8 (*) 3.5 - 5.1 mEq/L   Chloride 102  96 - 112 mEq/L   CO2 28  19 - 32 mEq/L   Glucose, Bld 121 (*) 70 - 99 mg/dL   BUN 9  6 - 23 mg/dL   Creatinine, Ser 1.61  0.50 - 1.35 mg/dL   Calcium 8.7  8.4 - 09.6 mg/dL   GFR calc non Af Amer >90  >90 mL/min   GFR calc Af Amer >90  >90 mL/min  GLUCOSE, CAPILLARY     Status: Abnormal   Collection Time   12/11/12  7:36 AM      Component Value Range   Glucose-Capillary 124 (*) 70 - 99 mg/dL    Intake/Output Summary (Last 24 hours) at 12/11/12 0825 Last data filed at 12/11/12 0700  Gross per 24 hour  Intake 630.42 ml  Output      0 ml  Net 630.42 ml   Telemetry:  NSR. 12/11/2012  ASSESSMENT AND PLAN:  Precordial chest pain:  Cath today. Of note given a description of palpitations, if he does not have obstructive CAD.  He will need an out patient monitor 21 days.   Hypokalemia:  Supplement.  Repeat BMET later  today.   Diabetes:  A1c 7.0.  Continue therapy and further plans per PERINI,MARK A, MD  Dyslipidemia:  Given diabetes, I will start him on moderate dose statin per guidelines.   HTN:  I will restart Lotensin  Abnormal CXR:  Rightward tracheal deviation.  He does not report any prior thyroid imaging. I will order a thyroid ultrasound.      Fayrene Fearing Keionna Kinnaird 12/11/2012 8:25 AM

## 2012-12-11 NOTE — H&P (View-Only) (Signed)
 SUBJECTIVE:  Denies any chest pain this am.  No SOB.     PHYSICAL EXAM Filed Vitals:   12/10/12 1000 12/10/12 1243 12/10/12 2100 12/11/12 0520  BP: 126/77 134/72 135/72 150/74  Pulse: 66 57 65 69  Temp:  97.7 F (36.5 C) 98.2 F (36.8 C) 98.6 F (37 C)  TempSrc:  Oral Oral   Resp:  16 18 18  Height:  5' 10" (1.778 m)    Weight:  197 lb 15.6 oz (89.8 kg)  198 lb (89.812 kg)  SpO2: 99% 99% 97% 96%   General:  No disttress HEENT:  PERRL Lungs:  Clear Heart:  RRR Abdomen:  Positive bowel sounds, no rebound no guarding Extremities:  No edema Neuro:  Nonfocal.  LABS: Lab Results  Component Value Date   CKTOTAL 62 12/10/2012   CKMB 2.3 12/10/2012   TROPONINI <0.30 12/11/2012   Results for orders placed during the hospital encounter of 12/10/12 (from the past 24 hour(s))  HEMOGLOBIN A1C     Status: Abnormal   Collection Time   12/10/12 10:15 AM      Component Value Range   Hemoglobin A1C 7.0 (*) <5.7 %   Mean Plasma Glucose 154 (*) <117 mg/dL  CK TOTAL AND CKMB     Status: Normal   Collection Time   12/10/12 10:25 AM      Component Value Range   Total CK 62  7 - 232 U/L   CK, MB 2.3  0.3 - 4.0 ng/mL   Relative Index RELATIVE INDEX IS INVALID  0.0 - 2.5  GLUCOSE, CAPILLARY     Status: Abnormal   Collection Time   12/10/12  1:01 PM      Component Value Range   Glucose-Capillary 104 (*) 70 - 99 mg/dL  TROPONIN I     Status: Normal   Collection Time   12/10/12  1:36 PM      Component Value Range   Troponin I <0.30  <0.30 ng/mL  CBC     Status: Abnormal   Collection Time   12/10/12  1:36 PM      Component Value Range   WBC 9.0  4.0 - 10.5 K/uL   RBC 4.49  4.22 - 5.81 MIL/uL   Hemoglobin 14.1  13.0 - 17.0 g/dL   HCT 39.0  39.0 - 52.0 %   MCV 86.9  78.0 - 100.0 fL   MCH 31.4  26.0 - 34.0 pg   MCHC 36.2 (*) 30.0 - 36.0 g/dL   RDW 13.4  11.5 - 15.5 %   Platelets 166  150 - 400 K/uL  CREATININE, SERUM     Status: Normal   Collection Time   12/10/12  1:36 PM   Component Value Range   Creatinine, Ser 0.74  0.50 - 1.35 mg/dL   GFR calc non Af Amer >90  >90 mL/min   GFR calc Af Amer >90  >90 mL/min  GLUCOSE, CAPILLARY     Status: Abnormal   Collection Time   12/10/12  4:34 PM      Component Value Range   Glucose-Capillary 215 (*) 70 - 99 mg/dL  TROPONIN I     Status: Normal   Collection Time   12/10/12  7:20 PM      Component Value Range   Troponin I <0.30  <0.30 ng/mL  GLUCOSE, CAPILLARY     Status: Abnormal   Collection Time   12/10/12  8:52 PM        Component Value Range   Glucose-Capillary 105 (*) 70 - 99 mg/dL   Comment 1 Notify RN    TROPONIN I     Status: Normal   Collection Time   12/11/12 12:13 AM      Component Value Range   Troponin I <0.30  <0.30 ng/mL  PROTIME-INR     Status: Normal   Collection Time   12/11/12 12:13 AM      Component Value Range   Prothrombin Time 14.5  11.6 - 15.2 seconds   INR 1.15  0.00 - 1.49  BASIC METABOLIC PANEL     Status: Abnormal   Collection Time   12/11/12  6:05 AM      Component Value Range   Sodium 142  135 - 145 mEq/L   Potassium 2.8 (*) 3.5 - 5.1 mEq/L   Chloride 102  96 - 112 mEq/L   CO2 28  19 - 32 mEq/L   Glucose, Bld 121 (*) 70 - 99 mg/dL   BUN 9  6 - 23 mg/dL   Creatinine, Ser 0.69  0.50 - 1.35 mg/dL   Calcium 8.7  8.4 - 10.5 mg/dL   GFR calc non Af Amer >90  >90 mL/min   GFR calc Af Amer >90  >90 mL/min  GLUCOSE, CAPILLARY     Status: Abnormal   Collection Time   12/11/12  7:36 AM      Component Value Range   Glucose-Capillary 124 (*) 70 - 99 mg/dL    Intake/Output Summary (Last 24 hours) at 12/11/12 0825 Last data filed at 12/11/12 0700  Gross per 24 hour  Intake 630.42 ml  Output      0 ml  Net 630.42 ml   Telemetry:  NSR. 12/11/2012  ASSESSMENT AND PLAN:  Precordial chest pain:  Cath today. Of note given a description of palpitations, if he does not have obstructive CAD.  He will need an out patient monitor 21 days.   Hypokalemia:  Supplement.  Repeat BMET later  today.   Diabetes:  A1c 7.0.  Continue therapy and further plans per PERINI,MARK A, MD  Dyslipidemia:  Given diabetes, I will start him on moderate dose statin per guidelines.   HTN:  I will restart Lotensin  Abnormal CXR:  Rightward tracheal deviation.  He does not report any prior thyroid imaging. I will order a thyroid ultrasound.      Hilarie Sinha 12/11/2012 8:25 AM   

## 2012-12-12 ENCOUNTER — Encounter (HOSPITAL_COMMUNITY): Payer: Self-pay | Admitting: Cardiology

## 2012-12-12 DIAGNOSIS — R072 Precordial pain: Secondary | ICD-10-CM | POA: Diagnosis not present

## 2012-12-12 LAB — BASIC METABOLIC PANEL
Calcium: 8.9 mg/dL (ref 8.4–10.5)
GFR calc Af Amer: >90 mL/min (ref >90)
GFR calc non Af Amer: >90 mL/min (ref >90)
Glucose, Bld: 140 mg/dL — ABNORMAL HIGH (ref 70–99)
Potassium: 3 mEq/L — ABNORMAL LOW (ref 3.5–5.1)
Sodium: 143 mEq/L (ref 135–145)

## 2012-12-12 LAB — TSH: TSH: 0.8 u[IU]/mL (ref 0.350–4.500)

## 2012-12-12 LAB — GLUCOSE, CAPILLARY: Glucose-Capillary: 147 mg/dL — ABNORMAL HIGH (ref 70–99)

## 2012-12-12 MED ORDER — VERAPAMIL HCL 2.5 MG/ML IV SOLN
INTRAVENOUS | Status: AC
  Filled 2012-12-12: qty 2

## 2012-12-12 MED ORDER — POTASSIUM CHLORIDE CRYS ER 20 MEQ PO TBCR: 40.0000 meq | EXTENDED_RELEASE_TABLET | Freq: Once | ORAL | Status: DC

## 2012-12-12 MED ORDER — HEPARIN SODIUM (PORCINE) 1000 UNIT/ML IJ SOLN
INTRAMUSCULAR | Status: AC
  Filled 2012-12-12: qty 1

## 2012-12-12 MED ORDER — ATORVASTATIN CALCIUM 40 MG PO TABS: 40.0000 mg | ORAL_TABLET | Freq: Every day | ORAL | Status: DC

## 2012-12-12 MED ORDER — GLYBURIDE-METFORMIN 2.5-500 MG PO TABS: 1.0000 | ORAL_TABLET | Freq: Two times a day (BID) | ORAL | Status: DC

## 2012-12-12 MED ORDER — POTASSIUM CHLORIDE CRYS ER 20 MEQ PO TBCR: 40.0000 meq | EXTENDED_RELEASE_TABLET | Freq: Every day | ORAL | Status: DC

## 2012-12-12 NOTE — Progress Notes (Signed)
Concurrent Utilization Review Completed Prue Lingenfelter J. Tavious Griesinger, RN, BSN, NCM [phone]  

## 2012-12-12 NOTE — Progress Notes (Signed)
Cardiology Progress Note Patient Name: Arthur Berry Date of Encounter: 12/12/2012, 6:00 AM     Subjective  No chest pain or sob. No palpitations   Objective   Telemetry: sinus rhythm 70-80s, no arrhythmias   Medications: . amLODipine  5 mg Oral Daily  . aspirin EC  81 mg Oral Daily  . atorvastatin  40 mg Oral q1800  . benazepril  40 mg Oral Daily  . enoxaparin (LOVENOX) injection  40 mg Subcutaneous Q24H  . insulin aspart  0-15 Units Subcutaneous TID WC  . insulin aspart  0-5 Units Subcutaneous QHS  . levETIRAcetam  500 mg Oral BID  . niacin  250 mg Oral Daily  . pantoprazole  40 mg Oral Daily  . phenytoin  300 mg Oral QHS  . potassium chloride SA  20 mEq Oral Daily  . sodium chloride  3 mL Intravenous Q12H  . sodium chloride  3 mL Intravenous Q12H      Physical Exam: Temp:  [97.8 F (36.6 C)-98.6 F (37 C)] 98.6 F (37 C) (01/30 0425) Pulse Rate:  [72-88] 84  (01/29 2000) Resp:  [17-23] 23  (01/29 2000) BP: (135-150)/(71-85) 141/71 mmHg (01/29 2000) SpO2:  [95 %-100 %] 95 % (01/30 0425) Weight:  [187 lb 2.7 oz (84.9 kg)] 187 lb 2.7 oz (84.9 kg) (01/30 0022)  General: Well developed white male, in no acute distress. Head: Normocephalic, atraumatic, sclera non-icteric, nares are without discharge.  Neck: Supple. Negative for carotid bruits or JVD Lungs: Clear bilaterally to auscultation without wheezes, rales, or rhonchi. Breathing is unlabored. Heart: RRR S1 S2 without murmurs, rubs, or gallops.  Abdomen: Soft, non-tender, non-distended with normoactive bowel sounds. No rebound/guarding. No obvious abdominal masses. Msk:  Strength and tone appear normal for age. Extremities: Right wrist with dry/intact dressing. No hematoma. No edema. No clubbing or cyanosis. Distal pedal pulses are intact and equal bilaterally. Neuro: Alert and oriented X 3. Moves all extremities spontaneously. Psych:  Responds to questions appropriately with a normal  affect.   Intake/Output Summary (Last 24 hours) at 12/12/12 0600 Last data filed at 12/12/12 0426  Gross per 24 hour  Intake   2415 ml  Output   1650 ml  Net    765 ml    Labs:  [facility] 12/11/12 1806 12/11/12 1154 12/11/12 0605  NA -- 141 142  K -- 3.0* 2.8*  CL -- 100 102  CO2 -- 28 28  GLUCOSE -- 112* 121*  BUN -- 8 9  CREATININE 0.61 0.70 --  CALCIUM -- 8.8 8.7   Basename 12/10/12 0643  AST 15  ALT 15  ALKPHOS 115  BILITOT 0.8  PROT 7.6  ALBUMIN 4.1   Basename 12/11/12 1806 12/10/12 1336 12/10/12 0643  WBC 8.8 9.0 --  NEUTROABS -- -- 6.2  HGB 14.1 14.1 --  HCT 39.8 39.0 --  MCV 86.3 86.9 --  PLT 145* 166 --   Basename 12/11/12 0013 12/10/12 1920 12/10/12 1336 12/10/12 1025  CKTOTAL -- -- -- 62  CKMB -- -- -- 2.3  TROPONINI <0.30 <0.30 <0.30 --   Basename 12/10/12 1015  HGBA1C 7.0*   Radiology/Studies:   12/10/2012 -  CHEST - 2 VIEW    Findings: Grossly unchanged cardiac silhouette and mediastinal contours.  There is persistent rightward deviation of the tracheal air column at the level of the thoracic inlet, similar to the 06/2008 examination.  Unchanged left basilar heterogeneous opacities.  No new focal airspace  opacities favored to represent atelectasis or scar.  No new focal airspace opacities.  No pleural effusion or pneumothorax.  There is multilevel flowing osteophytosis within the thoracic spine.  No acute osseous abnormalities.  Post cholecystectomy.  IMPRESSION: 1.  Left basilar atelectasis/scar without acute cardiopulmonary disease. 2.  Chronic rightward deviation of the tracheal air column, similar to the 2009 examination and possibly attributable to an enlarged left lobe of the thyroid.  Further evaluation with dedicated thyroid ultrasound may be performed as clinically indicated.     12/11/2012  - THYROID ULTRASOUND Findings:  Right thyroid lobe:  5.2 x 2.2 x 2.2 cm. Left thyroid lobe:  7.1 x 3.6 x 3.3 cm. Isthmus:  0.5 cm  Focal nodules:   Hypoechoic 1.0 x 0.4 x 0.8 cm nodule in the mid right lobe.  Hypoechoic 1.2 x 1.0 x 1.1 cm nodule in the mid right lobe.  Isoechoic 1.2 x 1.1 x 0.9 cm solid nodule in the right lower pole.  Associated coarse calcification.  Inhomogeneous 1.5 x 1.7 x 1.8 cm nodule in the medial aspect of the mid left lobe with coarse calcifications. Inhomogeneous 2.1 x 1.8 x 2.0 cm nodule in the lateral aspect of the mid left lobe. Homogeneous hypoechoic solid nodule in the left lower pole measuring 3.3 x 2.5 x 2.0 cm.  Lymphadenopathy:  None visualized.  IMPRESSION: Multi nodular goiter.  Dominant nodules in the inferior aspect of the left lobe are felt to account for the deviation of the trachea on chest x-ray.  Several of these nodules fit criteria for fine needle aspiration biopsy if not previously evaluated.      12/11/12 - Cardiac Cath Hemodynamics:  AO 156/79  LV 157/15  Coronary angiography:  Coronary dominance: right  Left mainstem: Widely patent without obstructive disease.  Left anterior descending (LAD): The LAD is patent throughout its course. There is no significant obstructive disease. The vessel reaches the left ventricular apex. The diagonal branches are patent without significant disease.  Left circumflex (LCx): The left circumflex gives off a large intermediate branch. There is no significant stenosis noted. The AV groove circumflex supplies 2 obtuse marginal branches without significant stenoses.  Right coronary artery (RCA): This is a large, dominant vessel. The mid vessel has minor irregularity with up to 20% stenosis. The PDA and PLA branches are patent without significant stenosis.  Left ventriculography: Left ventricular systolic function is at the lower limits of normal, LVEF is estimated at 55%, there is no significant mitral regurgitation  Final Conclusions:  1. Minimal nonobstructive coronary artery disease  2. Normal left ventricular systolic function  Recommendations: Suspect noncardiac  chest pain    Assessment and Plan   1. Precordial chest pain: Minimal nonobstructive CAD by cath yesterday. Suspect noncardiac chest pain. Cath site ok. Plans for outpatient 21 day event monitor given c/o palpitations.   2. Hypokalemia: Supplemented yesterday. BMET pending this am. May need to increase home dose at discharge  3. Diabetes Mellitus, Type 2: A1c 7.0. Hold metformin for 48hrs post cath, then resume.  4. Dyslipidemia: Statin initiated this admission. Will need f/u LFTs and lipid panel in 6 weeks.  5. Hypertension: BP ok. Cont Norvasc and Lotensin. Follow as outpatient  6. Multi nodular goiter: Chronic rightward tracheal deviation noted on CXR. Thyroid US demonstrated multi nodular goiter with dominant nodules in the inferior aspect of the left lobe likely accounting for deviation noted on CXR. Recommendations for fine needle aspiration biopsy. Check TSH   Signed, HOPE, JESSICA  PA-C  History and all data above reviewed.  Patient examined.  I agree with the findings as above.  No chest pain.  Ready for discharge.  The patient exam reveals COR:RRR  ,  Lungs: Clear  ,  Abd: Positive bowel sounds, no rebound no guarding, Ext Right wrist with slight bruising  .  All available labs, radiology testing, previous records reviewed. Agree with documented assessment and plan. Chest pain:  Non anginal.  No further cardiac work up.  Thyroid nodules.  I spoke with [name], MD who will follow up with the appropriate work up.    Fayrene Fearing [facility]  10:23 AM  12/12/2012

## 2012-12-12 NOTE — Discharge Summary (Signed)
Discharge Summary   Patient ID: Arthur Berry MRN: [id number], DOB/AGE: 01-18-1946 67 y.o.  Primary MD: [name], MD Primary Cardiologist: Rollene Rotunda MD Admit date: 12/10/2012 D/C date:     12/12/2012      Primary Discharge Diagnoses:  1. Precordial chest pain   - Minimal nonobstructive CAD, nl LV systolic fxn by cath 12/11/12  - Will arrange for outpatient 21 day event monitor for palpitations  2. Dyslipidemia  - Given h/o Diabetes was initiated on statin  - Will need f/u LFTs and Lipid panel in 6 weeks  3. Multi nodular goiter  - Thyroid US demonstrated multi nodular goiter with dominant nodules in the inferior aspect of the left lobe   - Recommendations for fine needle aspiration biopsy  - TSH pending at time of discharge; F/u w/ PCP  4. Hypokalemia  - Supplemented  - KDur increased to daily, F/u w/ PCP  Patient seen and examined.  Plan as discussed in my rounding note for today and outlined above. Rollene Rotunda  12/12/2012  2:20 PM    Secondary Discharge Diagnoses:  . Colon polyp     Tubular Adenoma  . Diabetes mellitus 2001  . Hypertension 1999  . Arthritis   . Epilepsy   . Sleep apnea   . Chronic kidney disease   . Neuromuscular disorder     Allergies No Known Allergies  Diagnostic Studies/Procedures:   12/11/2012 - THYROID ULTRASOUND  Findings: Right thyroid lobe: 5.2 x 2.2 x 2.2 cm. Left thyroid lobe: 7.1 x 3.6 x 3.3 cm. Isthmus: 0.5 cm Focal nodules: Hypoechoic 1.0 x 0.4 x 0.8 cm nodule in the mid right lobe. Hypoechoic 1.2 x 1.0 x 1.1 cm nodule in the mid right lobe. Isoechoic 1.2 x 1.1 x 0.9 cm solid nodule in the right lower pole. Associated coarse calcification. Inhomogeneous 1.5 x 1.7 x 1.8 cm nodule in the medial aspect of the mid left lobe with coarse calcifications. Inhomogeneous 2.1 x 1.8 x 2.0 cm nodule in the lateral aspect of the mid left lobe. Homogeneous hypoechoic solid nodule in the left lower pole measuring 3.3 x 2.5 x 2.0  cm. Lymphadenopathy: None visualized. IMPRESSION: Multi nodular goiter. Dominant nodules in the inferior aspect of the left lobe are felt to account for the deviation of the trachea on chest x-ray. Several of these nodules fit criteria for fine needle aspiration biopsy if not previously evaluated.   12/11/12 - Cardiac Cath  Hemodynamics:  AO 156/79  LV 157/15  Coronary angiography:  Coronary dominance: right  Left mainstem: Widely patent without obstructive disease.  Left anterior descending (LAD): The LAD is patent throughout its course. There is no significant obstructive disease. The vessel reaches the left ventricular apex. The diagonal branches are patent without significant disease.  Left circumflex (LCx): The left circumflex gives off a large intermediate branch. There is no significant stenosis noted. The AV groove circumflex supplies 2 obtuse marginal branches without significant stenoses.  Right coronary artery (RCA): This is a large, dominant vessel. The mid vessel has minor irregularity with up to 20% stenosis. The PDA and PLA branches are patent without significant stenosis.  Left ventriculography: Left ventricular systolic function is at the lower limits of normal, LVEF is estimated at 55%, there is no significant mitral regurgitation  Final Conclusions:  1. Minimal nonobstructive coronary artery disease  2. Normal left ventricular systolic function  Recommendations: Suspect noncardiac chest pain    History of Present Illness: 67 y.o. male w/  the above medical problems who presented to [facility] on 12/10[facility]12 with complaints of chest pain.  Hospital Course: EKG revealed NSR with no acute ST/T changes. CXR was without acute cardiopulmonary abnormalities, but did show chronic rightward deviation of the trachea. Labs were significant for normal troponin, K+ 3.1. He was admitted for further evaluation and treatment. Potassium was supplemented. Cardiac enzymes were cycled and  remained negative. Cardiac cath was performed demonstrating minimal nonobstructive CAD and nl LV systolic function. He tolerated the procedure well without complications. It was felt his chest pain was noncardiac in origin. Given his history of diabetes and other cardiac risk factors he was initiated on a statin and will therefore need follow up lipid panel and LFTs in 6 weeks. He reported complaints of palpitations recently. He was monitored on telemetry without signs of arrhythmias. A 21 day event monitor will be placed as an outpatient. Due to the tracheal abnormality on his CXR a thyroid US was obtained demonstrating multi nodular goiter with dominant nodules in the inferior aspect of the left lobe. Recommendations were made for fine needle aspiration biopsy which he will discuss with his primary care provider. TSH was obtained, but was pending at the time of discharge and will need to be followed up. He was seen and evaluated by Dr. Antoine Poche who felt he was stable for discharge home with plans for follow up as scheduled below.  Discharge Vitals: Blood pressure 146/75, pulse 77, temperature 98.6 F (37 C), temperature source Oral, resp. rate 15, height 5\' 10"  (1.778 m), weight 187 lb 2.7 oz (84.9 kg), SpO2 96.00%.  Labs: Component Value Date   WBC 8.8 12/11/2012   HGB 14.1 12/11/2012   HCT 39.8 12/11/2012   MCV 86.3 12/11/2012   PLT 145* 12/11/2012    Lab 12/12/12 0600 12/10/12 0643  NA 143 --  K 3.0* --  CL 105 --  CO2 24 --  BUN 11 --  CREATININE 0.73 --  CALCIUM 8.9 --  PROT -- 7.6  BILITOT -- 0.8  ALKPHOS -- 115  ALT -- 15  AST -- 15  GLUCOSE 140* --   Basename  12/11/12 0013  12/10/12 1920  12/10/12 1336  12/10/12 1025   CKTOTAL  --  --  --  62   CKMB  --  --  --  2.3   TROPONINI  <0.30  <0.30  <0.30  --    Basename  12/10/12 1015   HGBA1C  7.0*      Discharge Medications     Medication List     As of 12/12/2012  2:04 PM    TAKE these medications         amLODipine 5  MG tablet   Commonly known as: NORVASC   Take 5 mg by mouth daily.      aspirin EC 81 MG tablet   Take 81 mg by mouth daily.      atorvastatin 40 MG tablet   Commonly known as: LIPITOR   Take 1 tablet (40 mg total) by mouth at bedtime.      benazepril 40 MG tablet   Commonly known as: LOTENSIN   Take 40 mg by mouth daily.      cholecalciferol 1000 UNITS tablet   Commonly known as: VITAMIN D   Take 1,000 Units by mouth daily.      glyBURIDE-metformin 2.5-500 MG per tablet   Commonly known as: GLUCOVANCE   Take 1 tablet by mouth 2 (two) times daily  with a meal. Please hold for 48hrs after cardiac catheterization. Resume on 12/14/12      levETIRAcetam 500 MG tablet   Commonly known as: KEPPRA   Take 500 mg by mouth 2 (two) times daily.      multivitamin with minerals Tabs   Take 1 tablet by mouth daily.      niacin 250 MG tablet   Take 250 mg by mouth daily.      omeprazole 20 MG capsule   Commonly known as: PRILOSEC   Take 1 capsule (20 mg total) by mouth daily.      phenytoin 100 MG ER capsule   Commonly known as: DILANTIN   Take 300 mg by mouth at bedtime.      potassium chloride SA 20 MEQ tablet   Commonly known as: K-DUR,KLOR-CON   Take 2 tablets (40 mEq total) by mouth daily.      VITAMIN B-12 CR PO   Take 1 tablet by mouth daily.          Disposition   Discharge Orders    Future Orders Please Complete By Expires   Diet - low sodium heart healthy      Increase activity slowly      Discharge instructions      Comments:   * Please take all medications as prescribed and bring them with you to your office visits  *Please do not take your Glucovance for 48hrs after your cardiac catheterization. You may resume on 12/14/12  * You were started on a cholesterol medication (Lipitor) this hospitalization and will need follow up blood work (lipid panel and liver function tests) in 6wks  * Please follow up with your primary care provider in the next week to have  your potassium level checked and to discuss the abnormal findings on your thyroid.  * Our office will call you when your heart monitor is ready for pick up.     Follow-up Information    Follow up with [facility]. (Our office will call you to pick up your heart monitor and for your follow up appointment )    Contact information:   [address] [place] [address] [phone]       Schedule an appointment as soon as possible for a visit with [name], MD.   Contact information:   [address] [place] [address] [phone]           Outstanding Labs/Studies:  1. 21 day Event Monitor 2. f/u LFTs and Lipid panel in 6 weeks 3. TSH pending at time of discharge; F/u abnormal Thyroid US  Duration of Discharge Encounter: Greater than 30 minutes including physician and PA time. Signed, HOPE, JESSICA PA-C 12/12/2012, 2:04 PM  Patient seen and examined.  Plan as discussed in my rounding note for today and outlined above. Fayrene Fearing Catherene Kaleta  12/12/2012  2:20 PM

## 2012-12-13 ENCOUNTER — Other Ambulatory Visit: Payer: Self-pay | Admitting: Internal Medicine

## 2012-12-13 DIAGNOSIS — E042 Nontoxic multinodular goiter: Secondary | ICD-10-CM

## 2012-12-19 ENCOUNTER — Other Ambulatory Visit: Payer: Self-pay

## 2012-12-19 DIAGNOSIS — E042 Nontoxic multinodular goiter: Secondary | ICD-10-CM

## 2012-12-20 ENCOUNTER — Ambulatory Visit (INDEPENDENT_AMBULATORY_CARE_PROVIDER_SITE_OTHER): Payer: Medicare Other

## 2012-12-20 DIAGNOSIS — R079 Chest pain, unspecified: Secondary | ICD-10-CM | POA: Diagnosis not present

## 2012-12-20 DIAGNOSIS — I2510 Atherosclerotic heart disease of native coronary artery without angina pectoris: Secondary | ICD-10-CM

## 2012-12-20 NOTE — Progress Notes (Signed)
Placed a 21 days  monitor on patient and went over instruction on how to use and when to return it

## 2012-12-31 ENCOUNTER — Inpatient Hospital Stay
Admission: RE | Admit: 2012-12-31 | Discharge: 2012-12-31 | Disposition: A | Discharge status: Home / Self Care | Payer: Medicare Other | Source: Ambulatory Visit | Attending: Internal Medicine | Admitting: Internal Medicine

## 2013-01-07 ENCOUNTER — Ambulatory Visit
Admission: RE | Admit: 2013-01-07 | Discharge: 2013-01-07 | Disposition: A | Discharge status: Home / Self Care | Payer: Medicare Other | Source: Ambulatory Visit | Attending: Internal Medicine | Admitting: Internal Medicine

## 2013-01-07 ENCOUNTER — Other Ambulatory Visit (HOSPITAL_COMMUNITY)
Admission: RE | Admit: 2013-01-07 | Discharge: 2013-01-07 | Disposition: A | Discharge status: Home / Self Care | Payer: Medicare Other | Source: Ambulatory Visit | Attending: Interventional Radiology | Admitting: Interventional Radiology

## 2013-01-07 ENCOUNTER — Encounter: Payer: Self-pay | Admitting: Cardiology

## 2013-01-07 DIAGNOSIS — E0789 Other specified disorders of thyroid: Secondary | ICD-10-CM | POA: Diagnosis not present

## 2013-01-07 DIAGNOSIS — E049 Nontoxic goiter, unspecified: Secondary | ICD-10-CM | POA: Insufficient documentation

## 2013-01-16 DIAGNOSIS — I10 Essential (primary) hypertension: Secondary | ICD-10-CM | POA: Diagnosis not present

## 2013-01-16 DIAGNOSIS — E119 Type 2 diabetes mellitus without complications: Secondary | ICD-10-CM | POA: Diagnosis not present

## 2013-01-16 DIAGNOSIS — E785 Hyperlipidemia, unspecified: Secondary | ICD-10-CM | POA: Diagnosis not present

## 2013-01-16 DIAGNOSIS — R569 Unspecified convulsions: Secondary | ICD-10-CM | POA: Diagnosis not present

## 2013-01-16 DIAGNOSIS — Z125 Encounter for screening for malignant neoplasm of prostate: Secondary | ICD-10-CM | POA: Diagnosis not present

## 2013-01-21 DIAGNOSIS — E119 Type 2 diabetes mellitus without complications: Secondary | ICD-10-CM | POA: Diagnosis not present

## 2013-01-21 DIAGNOSIS — R569 Unspecified convulsions: Secondary | ICD-10-CM | POA: Diagnosis not present

## 2013-01-21 DIAGNOSIS — I10 Essential (primary) hypertension: Secondary | ICD-10-CM | POA: Diagnosis not present

## 2013-01-21 DIAGNOSIS — E785 Hyperlipidemia, unspecified: Secondary | ICD-10-CM | POA: Diagnosis not present

## 2013-01-21 DIAGNOSIS — Z0000 Encounter for general adult medical examination without abnormal findings: Secondary | ICD-10-CM | POA: Diagnosis not present

## 2013-01-23 ENCOUNTER — Ambulatory Visit: Payer: Medicare Other | Admitting: Cardiology

## 2013-02-17 ENCOUNTER — Encounter: Payer: Self-pay | Admitting: Cardiology

## 2013-02-17 ENCOUNTER — Ambulatory Visit (INDEPENDENT_AMBULATORY_CARE_PROVIDER_SITE_OTHER): Payer: Medicare Other | Admitting: Cardiology

## 2013-02-17 VITALS — BP 130/74 | HR 62 | Ht 70.0 in | Wt 211.4 lb

## 2013-02-17 DIAGNOSIS — R072 Precordial pain: Secondary | ICD-10-CM

## 2013-02-17 DIAGNOSIS — I10 Essential (primary) hypertension: Secondary | ICD-10-CM | POA: Diagnosis not present

## 2013-02-17 NOTE — Patient Instructions (Addendum)
The current medical regimen is effective;  continue present plan and medications.  Follow up in 1 year with Dr Hochrein.  You will receive a letter in the mail 2 months before you are due.  Please call us when you receive this letter to schedule your follow up appointment.  

## 2013-02-17 NOTE — Progress Notes (Signed)
HPI The patient presents after recent cath for evaluation of chest pain.    This demonstrated no significant coronary disease. He had normal LV function. He subsequently wore a 21 day monitor because of palpitations but this demonstrated sinus rhythm. Since that time the patient has had no further symptoms. The patient denies any new symptoms such as chest discomfort, neck or arm discomfort. There has been no new shortness of breath, PND or orthopnea. There have been no reported palpitations, presyncope or syncope.  No Known Allergies  Current Outpatient Prescriptions  Medication Sig Dispense Refill  . amLODipine (NORVASC) 5 MG tablet Take 5 mg by mouth daily.      Marland Kitchen aspirin EC 81 MG tablet Take 81 mg by mouth daily.      Marland Kitchen atorvastatin (LIPITOR) 40 MG tablet Take 1 tablet (40 mg total) by mouth at bedtime.  30 tablet  2  . benazepril (LOTENSIN) 40 MG tablet Take 40 mg by mouth daily.        . cholecalciferol (VITAMIN D) 1000 UNITS tablet Take 1,000 Units by mouth daily.        . Cyanocobalamin (VITAMIN B-12 CR PO) Take 1 tablet by mouth daily.      Marland Kitchen glyBURIDE-metformin (GLUCOVANCE) 2.5-500 MG per tablet Take 1 tablet by mouth 2 (two) times daily with a meal. Please hold for 48hrs after cardiac catheterization. Resume on 12/14/12      . levETIRAcetam (KEPPRA) 500 MG tablet Take 500 mg by mouth 2 (two) times daily.        . Multiple Vitamin (MULTIVITAMIN WITH MINERALS) TABS Take 1 tablet by mouth daily.  30 tablet  0  . niacin 250 MG tablet Take 250 mg by mouth daily.        Marland Kitchen omeprazole (PRILOSEC) 20 MG capsule Take 1 capsule (20 mg total) by mouth daily.  30 capsule  11  . phenytoin (DILANTIN) 100 MG ER capsule Take 300 mg by mouth at bedtime.       . potassium chloride SA (K-DUR,KLOR-CON) 20 MEQ tablet Take 2 tablets (40 mEq total) by mouth daily.  60 tablet  1   No current facility-administered medications for this visit.    Past Medical History  Diagnosis Date  . Colon polyp    Tubular Adenoma  . Diabetes mellitus 2001  . Hypertension 1999  . Arthritis   . Hyperlipidemia 1999  . Epilepsy   . Sleep apnea   . Chronic kidney disease   . Neuromuscular disorder   . Coronary artery disease, non-occlusive     Minimal nonobstructive CAD, nl LV systolic fxn by cath 12/11/12  . Goiter, nontoxic, multinodular     Thyroid US 11/2012    Past Surgical History  Procedure Laterality Date  . Knee surgery  2008    Left  . Cholecystectomy    . Inguinal hernia repair  06/27/11    left    ROS:  As stated in the HPI and negative for all other systems.  PHYSICAL EXAM BP 130/74  Pulse 62  Ht 5\' 10"  (1.778 m)  Wt 211 lb 6.4 oz (95.89 kg)  BMI 30.33 kg/m2 GENERAL:  Well appearing HEENT:  Pupils equal round and reactive, fundi not visualized, oral mucosa unremarkable NECK:  No jugular venous distention, waveform within normal limits, carotid upstroke brisk and symmetric, no bruits, no thyromegaly LUNGS:  Clear to auscultation bilaterally CHEST:  Unremarkable HEART:  PMI not displaced or sustained,S1 and S2 within normal limits, no  S3, no S4, no clicks, no rubs, no murmurs ABD:  Flat, positive bowel sounds normal in frequency in pitch, no bruits, no rebound, no guarding, no midline pulsatile mass, no hepatomegaly, no splenomegaly EXT:  2 plus pulses throughout, no edema, no cyanosis no clubbing   ASSESSMENT AND PLAN  CHEST PAIN:  He is no longer bothered by this. There is no obstructive coronary disease. No further workup is suggested.  PALPITATIONS:  He has no new palpitations and his monitor was unremarkable. No further workup is planned.  HTN:  The blood pressure is at target. No change in medications is indicated. We will continue with therapeutic lifestyle changes (TLC).

## 2013-02-26 DIAGNOSIS — B351 Tinea unguium: Secondary | ICD-10-CM | POA: Diagnosis not present

## 2013-02-26 DIAGNOSIS — M79609 Pain in unspecified limb: Secondary | ICD-10-CM | POA: Diagnosis not present

## 2013-04-11 DIAGNOSIS — R351 Nocturia: Secondary | ICD-10-CM | POA: Diagnosis not present

## 2013-04-11 DIAGNOSIS — N200 Calculus of kidney: Secondary | ICD-10-CM | POA: Diagnosis not present

## 2013-04-11 DIAGNOSIS — N401 Enlarged prostate with lower urinary tract symptoms: Secondary | ICD-10-CM | POA: Diagnosis not present

## 2013-04-17 ENCOUNTER — Other Ambulatory Visit: Payer: Self-pay | Admitting: Urology

## 2013-04-22 ENCOUNTER — Encounter (HOSPITAL_COMMUNITY): Payer: Self-pay | Admitting: Pharmacy Technician

## 2013-04-23 ENCOUNTER — Encounter (HOSPITAL_COMMUNITY): Payer: Self-pay | Admitting: *Deleted

## 2013-04-27 ENCOUNTER — Encounter (HOSPITAL_COMMUNITY): Payer: Self-pay | Admitting: *Deleted

## 2013-04-27 ENCOUNTER — Emergency Department (HOSPITAL_COMMUNITY)
Admission: EM | Admit: 2013-04-27 | Discharge: 2013-04-27 | Disposition: A | Discharge status: Home / Self Care | Payer: Medicare Other | Attending: Emergency Medicine | Admitting: Emergency Medicine

## 2013-04-27 ENCOUNTER — Emergency Department (HOSPITAL_COMMUNITY): Payer: Medicare Other

## 2013-04-27 DIAGNOSIS — E049 Nontoxic goiter, unspecified: Secondary | ICD-10-CM | POA: Diagnosis not present

## 2013-04-27 DIAGNOSIS — Y939 Activity, unspecified: Secondary | ICD-10-CM | POA: Insufficient documentation

## 2013-04-27 DIAGNOSIS — X58XXXA Exposure to other specified factors, initial encounter: Secondary | ICD-10-CM | POA: Insufficient documentation

## 2013-04-27 DIAGNOSIS — Z7982 Long term (current) use of aspirin: Secondary | ICD-10-CM | POA: Diagnosis not present

## 2013-04-27 DIAGNOSIS — Z79899 Other long term (current) drug therapy: Secondary | ICD-10-CM | POA: Diagnosis not present

## 2013-04-27 DIAGNOSIS — S60459A Superficial foreign body of unspecified finger, initial encounter: Secondary | ICD-10-CM

## 2013-04-27 DIAGNOSIS — Z8669 Personal history of other diseases of the nervous system and sense organs: Secondary | ICD-10-CM | POA: Insufficient documentation

## 2013-04-27 DIAGNOSIS — Z8719 Personal history of other diseases of the digestive system: Secondary | ICD-10-CM | POA: Insufficient documentation

## 2013-04-27 DIAGNOSIS — M1990 Unspecified osteoarthritis, unspecified site: Secondary | ICD-10-CM | POA: Insufficient documentation

## 2013-04-27 DIAGNOSIS — Y929 Unspecified place or not applicable: Secondary | ICD-10-CM | POA: Insufficient documentation

## 2013-04-27 DIAGNOSIS — T148XXA Other injury of unspecified body region, initial encounter: Secondary | ICD-10-CM

## 2013-04-27 DIAGNOSIS — G4730 Sleep apnea, unspecified: Secondary | ICD-10-CM | POA: Insufficient documentation

## 2013-04-27 DIAGNOSIS — E119 Type 2 diabetes mellitus without complications: Secondary | ICD-10-CM | POA: Diagnosis not present

## 2013-04-27 DIAGNOSIS — E785 Hyperlipidemia, unspecified: Secondary | ICD-10-CM | POA: Insufficient documentation

## 2013-04-27 DIAGNOSIS — G40909 Epilepsy, unspecified, not intractable, without status epilepticus: Secondary | ICD-10-CM | POA: Insufficient documentation

## 2013-04-27 DIAGNOSIS — I129 Hypertensive chronic kidney disease with stage 1 through stage 4 chronic kidney disease, or unspecified chronic kidney disease: Secondary | ICD-10-CM | POA: Diagnosis not present

## 2013-04-27 DIAGNOSIS — Z87891 Personal history of nicotine dependence: Secondary | ICD-10-CM | POA: Diagnosis not present

## 2013-04-27 DIAGNOSIS — N189 Chronic kidney disease, unspecified: Secondary | ICD-10-CM | POA: Diagnosis not present

## 2013-04-27 DIAGNOSIS — M25849 Other specified joint disorders, unspecified hand: Secondary | ICD-10-CM | POA: Diagnosis not present

## 2013-04-27 DIAGNOSIS — I2510 Atherosclerotic heart disease of native coronary artery without angina pectoris: Secondary | ICD-10-CM | POA: Diagnosis not present

## 2013-04-27 HISTORY — DX: Other injury of unspecified body region, initial encounter: T14.8XXA

## 2013-04-27 NOTE — ED Notes (Signed)
Patient has been called x3 times with no response

## 2013-04-27 NOTE — ED Provider Notes (Signed)
History    This chart was scribed for Doran Durand, non-physician practitioner working with Richardean Canal, MD by Leone Payor, ED Scribe. This patient was seen in room TR10C/TR10C and the patient's care was started at 1442.   CSN: [id number]  Arrival date & time 04/27/13  1442   First MD Initiated Contact with Patient 04/27/13 1708      Chief Complaint  Patient presents with  . Foreign Body in Skin     The history is provided by the patient. No language interpreter was used.    HPI Comments: Arthur Berry is a 67 y.o. male who presents to the Emergency Department complaining of a splinter to the R index finger for about 1-2 days. He is concerned that it may be a metal sliver. He has not attempted to removed the sliver by himself. He has not noticed any drainage from the area.  No surrounding erythema or edema.  Sliver came from his CPAP machine.  He denies fever, chills. Pt is a former smoker but denies alcohol use.     Past Medical History  Diagnosis Date  . Colon polyp     Tubular Adenoma  . Hypertension 1999  . Arthritis   . Hyperlipidemia 1999  . Epilepsy   . Sleep apnea   . Chronic kidney disease   . Coronary artery disease, non-occlusive     Minimal nonobstructive CAD, nl LV systolic fxn by cath 12/11/12  . Goiter, nontoxic, multinodular     Thyroid US 11/2012  . Diabetes mellitus 2001    TYPE 2  . Neuromuscular disorder     Past Surgical History  Procedure Laterality Date  . Knee surgery  2008    Left  . Cholecystectomy    . Inguinal hernia repair  06/27/11    left    Family History  Problem Relation Age of Onset  . Breast cancer Sister   . Colon cancer      unknown  . Diabetes Sister     History  Substance Use Topics  . Smoking status: Former Smoker    Types: Cigars    Quit date: 04/14/1986  . Smokeless tobacco: Never Used     Comment: Quit cigars in the 1980s  . Alcohol Use: No      Review of Systems  Skin: Positive for wound (foreign  body-splinter).  All other systems reviewed and are negative.    Allergies  Review of patient's allergies indicates no known allergies.  Home Medications   Current Outpatient Rx  Name  Route  Sig  Dispense  Refill  . amLODipine (NORVASC) 5 MG tablet   Oral   Take 5 mg by mouth every morning.          Marland Kitchen aspirin EC 81 MG tablet   Oral   Take 81 mg by mouth daily.         . Cyanocobalamin (VITAMIN B-12 CR PO)   Oral   Take 1 tablet by mouth daily.         Marland Kitchen escitalopram (LEXAPRO) 10 MG tablet   Oral   Take 10 mg by mouth every morning.         . glyBURIDE-metformin (GLUCOVANCE) 2.5-500 MG per tablet   Oral   Take 1 tablet by mouth 2 (two) times daily with a meal.         . levETIRAcetam (KEPPRA) 500 MG tablet   Oral   Take 500 mg by mouth 2 (two) times  daily.           . Multiple Vitamin (MULTIVITAMIN WITH MINERALS) TABS   Oral   Take 1 tablet by mouth daily.         . niacin 250 MG tablet   Oral   Take 250 mg by mouth daily.           Marland Kitchen omeprazole (PRILOSEC) 20 MG capsule   Oral   Take 1 capsule (20 mg total) by mouth daily.   30 capsule   11   . phenytoin (DILANTIN) 100 MG ER capsule   Oral   Take 400 mg by mouth at bedtime as needed (for seizures).          . potassium chloride SA (K-DUR,KLOR-CON) 20 MEQ tablet   Oral   Take 20 mEq by mouth 2 (two) times daily.         . sodium chloride (OCEAN) 0.65 % nasal spray   Nasal   Place 1 spray into the nose daily as needed for congestion (if cpap not available).         . tamsulosin (FLOMAX) 0.4 MG CAPS   Oral   Take 0.4 mg by mouth daily.         . vitamin C (ASCORBIC ACID) 500 MG tablet   Oral   Take 500 mg by mouth daily.           BP 164/80  Pulse 97  Temp(Src) 98.4 F (36.9 C)  Resp 18  SpO2 97%  Physical Exam  Nursing note and vitals reviewed. Constitutional: He appears well-developed and well-nourished.  HENT:  Head: Normocephalic and atraumatic.   Mouth/Throat: Oropharynx is clear and moist.  Eyes: EOM are normal. Pupils are equal, round, and reactive to light.  Neck: Normal range of motion. Neck supple.  Cardiovascular: Normal rate, regular rhythm and normal heart sounds.   Pulmonary/Chest: Effort normal and breath sounds normal. He has no wheezes.  Musculoskeletal: Normal range of motion.  Neurological: He is alert.  Skin: Skin is warm and dry.  Sliver to the R index finger, palmer surface at the level of DIP. No surrounding erythema, edema. No drainage noted.   Psychiatric: He has a normal mood and affect. His behavior is normal.    ED Course  Procedures (including critical care time)  DIAGNOSTIC STUDIES: Oxygen Saturation is 97% on RA, normal by my interpretation.    COORDINATION OF CARE: 6:37 PM Discussed treatment plan with pt at bedside and pt agreed to plan.   6:49 PM Foreign body removed with 18 gauge needle.   Labs Reviewed - No data to display Dg Finger Index Right  04/27/2013   *RADIOLOGY REPORT*  Clinical Data: Evaluate for foreign body.  RIGHT INDEX FINGER 2+V  Comparison: None.  Findings: Three views of the index finger were obtained.  No evidence for a radiopaque or metallic foreign body.  No evidence for acute fracture or dislocation.  Mild joint space narrowing at the DIP joint. Punctate density along the palmar aspect of the middle phalanx probably represents an ossification.  IMPRESSION: No acute bony abnormality.  No evidence for a radiopaque foreign body.   Original Report Authenticated By: Richarda Overlie, M.D.     No diagnosis found.    MDM  Patient presenting with sliver of his right index finger.  Sliver removed in the ED.  No signs of infection at this time.  I personally performed the services described in this documentation, which was scribed in  my presence. The recorded information has been reviewed and is accurate.   Pascal Lux Garland, PA-C 04/28/13 [phone]

## 2013-04-27 NOTE — ED Notes (Signed)
The pt is c/o a splinter in the rt index  1-2 days.  He thinks it may be metal because he was working with metal

## 2013-04-28 ENCOUNTER — Encounter (HOSPITAL_COMMUNITY)
Admission: RE | Disposition: A | Discharge status: Home / Self Care | Payer: Self-pay | Source: Ambulatory Visit | Attending: Urology

## 2013-04-28 ENCOUNTER — Encounter (HOSPITAL_COMMUNITY): Payer: Self-pay

## 2013-04-28 ENCOUNTER — Ambulatory Visit (HOSPITAL_COMMUNITY): Payer: Medicare Other

## 2013-04-28 ENCOUNTER — Ambulatory Visit (HOSPITAL_COMMUNITY)
Admission: RE | Admit: 2013-04-28 | Discharge: 2013-04-28 | Disposition: A | Discharge status: Home / Self Care | Payer: Medicare Other | Source: Ambulatory Visit | Attending: Urology | Admitting: Urology

## 2013-04-28 DIAGNOSIS — E119 Type 2 diabetes mellitus without complications: Secondary | ICD-10-CM | POA: Insufficient documentation

## 2013-04-28 DIAGNOSIS — E7800 Pure hypercholesterolemia, unspecified: Secondary | ICD-10-CM | POA: Insufficient documentation

## 2013-04-28 DIAGNOSIS — Z7982 Long term (current) use of aspirin: Secondary | ICD-10-CM | POA: Insufficient documentation

## 2013-04-28 DIAGNOSIS — I10 Essential (primary) hypertension: Secondary | ICD-10-CM | POA: Diagnosis not present

## 2013-04-28 DIAGNOSIS — G4730 Sleep apnea, unspecified: Secondary | ICD-10-CM | POA: Insufficient documentation

## 2013-04-28 DIAGNOSIS — Z79899 Other long term (current) drug therapy: Secondary | ICD-10-CM | POA: Diagnosis not present

## 2013-04-28 DIAGNOSIS — N200 Calculus of kidney: Secondary | ICD-10-CM | POA: Diagnosis not present

## 2013-04-28 HISTORY — DX: Other injury of unspecified body region, initial encounter: T14.8XXA

## 2013-04-28 LAB — GLUCOSE, CAPILLARY: Glucose-Capillary: 158 mg/dL — ABNORMAL HIGH (ref 70–99)

## 2013-04-28 SURGERY — LITHOTRIPSY, ESWL
Anesthesia: LOCAL | Laterality: Right

## 2013-04-28 MED ORDER — DEXTROSE-NACL 5-0.45 % IV SOLN
INTRAVENOUS | Status: DC
  Administered 2013-04-28: 125 mL via INTRAVENOUS

## 2013-04-28 MED ORDER — CIPROFLOXACIN HCL 500 MG PO TABS
500.0000 mg | ORAL_TABLET | ORAL | Status: AC
  Administered 2013-04-28: 500 mg via ORAL
  Filled 2013-04-28: qty 1

## 2013-04-28 MED ORDER — DIPHENHYDRAMINE HCL 25 MG PO CAPS
25.0000 mg | ORAL_CAPSULE | ORAL | Status: AC
  Administered 2013-04-28: 25 mg via ORAL
  Filled 2013-04-28: qty 1

## 2013-04-28 MED ORDER — DIAZEPAM 5 MG PO TABS
10.0000 mg | ORAL_TABLET | ORAL | Status: AC
  Administered 2013-04-28: 10 mg via ORAL
  Filled 2013-04-28: qty 2

## 2013-04-28 NOTE — Op Note (Signed)
See [facility] OP note scanned into chart. 

## 2013-04-28 NOTE — H&P (Signed)
History of Present Illness     [name] returns for routine follow-up. He is status post a right-sided percutaneous nephrolithotomy done in June of 2012 for a fairly substantial stone burden in the right side.  Follow-up imaging showed resolution of all the stone.  He did not have a previous history of clinically symptomatic stone issues but had been diagnosed with a 1 cm stone in the lower pole of his right kidney years before this procedure. He has had no recurrent abdominal pain. Urinalysis today is crystal clear.  He has not had any other urologic issues.  He tells me that Dr. Waynard Berry does do routine PSA testing on him. Previous stone analysis and revealed primarily calcium oxalate composition.  On KUB followup in February of 2013 I did not see any obvious suspicious calcifications over either kidney or ureter.      Past Medical History Problems  1. History of  Anxiety (Symptom) 300.00 2. History of  Arthritis V13.4 3. History of  Cholelithiasis 574.20 4. History of  Diabetes Mellitus 250.00 5. History of  Hypercholesterolemia 272.0 6. History of  Hypertension 401.9 7. History of  Seizure 8. History of  Sleep Apnea 780.57  Surgical History Problems  1. History of  Bladder Surgery 2. History of  Knee Arthroscopy 3. History of  Percutaneous Catheter Placement Into Renal Pelvis 4. History of  Percutaneous Lithotomy For Stone Over 2cm.  Current Meds 1. AmLODIPine Besylate 5 MG Oral Tablet; TAKE 1 TABLET DAILY; Therapy: (Recorded:29May2012)  to 2. Aspirin 81 MG Oral Tablet; 1 per day; Therapy: (Recorded:29May2012) to 3. Benazepril HCl 40 MG Oral Tablet; TAKE 1 TABLET DAILY; Therapy: (Recorded:29May2012) to 4. Fish Oil CAPS; 1200mg ; 1 capsule twice daily; Therapy: (Recorded:29May2012) to 5. GlyBURIDE TABS; 2.5-500; Take 1 tablet twice daily; Therapy: (Recorded:29May2012) to 6. Klor-Con M20 20 MEQ Oral Tablet Extended Release; Monday, Wednesday, Friday; Therapy:  (Recorded:29May2012)  to 7. LevETIRAcetam 500 MG Oral Tablet; 1 tablet twice daily; Therapy: (Recorded:29May2012) to 8. Lipitor 20 MG Oral Tablet; 1 per day; Therapy: (Recorded:29May2012) to 9. Niacin 500 MG Oral Tablet; 1 tablet daily; Therapy: (Recorded:29May2012) to 10. Phenytoin Sodium Extended 100 MG Oral Capsule; 3 capsules at night; Therapy:   (Recorded:29May2012) to 11. Phenytoin Sodium Extended 100 MG Oral Capsule; Therapy: (Recorded:29May2012) to 12. Tamsulosin HCl 0.4 MG Oral Capsule; TAKE 1 CAPSULE Daily; Therapy: (Recorded:29May2012)   to 13. Vitamin D TABS; 1000u 1 per day; Therapy: (Recorded:29May2012) to  Allergies Medication  1. No Known Drug Allergies  Family History Problems  1. Fraternal history of  Cholelithiasis 2. Maternal history of  Father Deceased At Age ____ 80 / Dementia 3. Maternal history of  Mother Deceased At Age ____ 64 / Blood Clot in lungs  Social History Problems  1. Marital History - Single 2. Never A Smoker 3. Occupation: Medical laboratory scientific officer at [employer] Denied  4. History of  Alcohol Use 5. History of  Caffeine Use  Review of Systems Genitourinary, constitutional, skin, eye, otolaryngeal, hematologic/lymphatic, cardiovascular, pulmonary, endocrine, musculoskeletal, gastrointestinal, neurological and psychiatric system(s) were reviewed and pertinent findings if present are noted.  Genitourinary: urinary frequency, nocturia (4-6x. ), weak urinary stream and erectile dysfunction.  Gastrointestinal: abdominal pain and heartburn.  Integumentary: skin rash/lesion and pruritus.  ENT: sinus problems.  Cardiovascular: leg swelling.  Musculoskeletal: joint pain.  Neurological: headache.  Psychiatric: anxiety.    Vitals Vital Signs [Data Includes: Last 1 Day]  30May2014 11:17AM  BMI Calculated: 30.36 BSA Calculated: 2.09 Height: 5 ft  9 in Weight: 205 lb  Blood Pressure: 152 / 86 Temperature: 97.3 F Heart Rate: 65  Physical Exam Constitutional:  Well nourished and well developed . No acute distress.  Neck: The appearance of the neck is normal and no neck mass is present.  Pulmonary: No respiratory distress and normal respiratory rhythm and effort.  Cardiovascular: Heart rate and rhythm are normal . No peripheral edema.  Abdomen: The abdomen is soft and nontender. No masses are palpated. No CVA tenderness. No hernias are palpable. No hepatosplenomegaly noted.  Rectal: Estimated prostate size is 2+. Normal rectal tone, no rectal masses, prostate is smooth, symmetric and non-tender. The prostate has no nodularity and is not indurated.  Skin: Normal skin turgor, no visible rash and no visible skin lesions.  Neuro/Psych:. Mood and affect are appropriate.    Results/Data Urine [Data Includes: Last 1 Day]   30May2014  COLOR YELLOW   APPEARANCE CLEAR   SPECIFIC GRAVITY 1.015   pH 6.0   GLUCOSE NEG mg/dL  BILIRUBIN NEG   KETONE NEG mg/dL  BLOOD TRACE   PROTEIN TRACE mg/dL  UROBILINOGEN 0.2 mg/dL  NITRITE NEG   LEUKOCYTE ESTERASE NEG   SQUAMOUS EPITHELIAL/HPF NONE SEEN   WBC NONE SEEN WBC/hpf  RBC 0-2 RBC/hpf  BACTERIA NONE SEEN   CRYSTALS Calcium Oxalate crystals noted   CASTS NONE SEEN     [name] underwent renal ultrasonography as well as KUB imaging. The right kidney measured 11.86 cm. There were several echogenic foci primarily in the lower pole. It appeared that there might be 3 discrete stones in the 5-7 mm size range. No hydronephrosis. On the left kidney there was a single echogenic foci of about 5 mm in the lower pole. Again, no significant hydronephrosis. Postvoid residual was minimal.  On KUB imaging the patient clearly has 2-3 distinct stones in the lower pole of the right kidney. Overall stone burden is close to 10 mm, given the combined stone size. On the left there appears to be a single 4 mm stone.     Assessment Assessed  1. Nephrolithiasis 592.0 2. Benign Prostatic Hypertrophy With Urinary Obstruction  600.01 3. Nocturia 788.43  Plan Benign Prostatic Hypertrophy With Urinary Obstruction (600.01)  1. Tamsulosin HCl 0.4 MG Oral Capsule; TAKE 1 CAPSULE Daily; Therapy: 30May2014 to  (Evaluate:26Dec2014); Last Rx:30May2014 2. Follow-up Schedule Surgery Office  Follow-up  Requested for: 30May2014  Discussion/Summary          1. Recurrent nephrolithiasis. Again, [name] is now 2 years status post his percutaneous procedure for quite a large stone. CT scan immediately after that procedure showed complete resolution of that stone with no residual fragments. Back in February of last year, KUB imaging revealed no obvious stones, but clearly in the last 14 months, he has developed recurrent stone burden. He now has what appears to be 3 stones in the lower pole of his right kidney. There was enough of his stone burden here that I do recommend that we proceed with ESWL to try to prevent the stone from increasing to a size where a percutaneous procedure would again be necessary. We do need to do some additional testing with regard to stone prevent for [name]. I am going to recommend that we do some 48-hour urine collections down the road. The left-sided stone can be monitored for now.   2. Increased voiding symptoms. His biggest complaint is nocturia of up to 4-6 times per evening. He has also had some urgency but  denies incontinence with dysuria. He has been on Flomax in the past and would like to restart that. Certainly, that may facilitate passage of any fragments after ESWL, and we will restart his Flomax. Prostate exam reveals a ____ and moderately enlarged prostate.

## 2013-05-02 NOTE — ED Provider Notes (Signed)
Medical screening examination/treatment/procedure(s) were performed by non-physician practitioner and as supervising physician I was immediately available for consultation/collaboration.   David H Yao, MD 05/02/13 0921 

## 2013-05-19 DIAGNOSIS — R351 Nocturia: Secondary | ICD-10-CM | POA: Diagnosis not present

## 2013-05-19 DIAGNOSIS — N200 Calculus of kidney: Secondary | ICD-10-CM | POA: Diagnosis not present

## 2013-05-21 ENCOUNTER — Emergency Department (HOSPITAL_COMMUNITY)
Admission: EM | Admit: 2013-05-21 | Discharge: 2013-05-21 | Disposition: A | Discharge status: Home / Self Care | Payer: Medicare Other | Attending: Emergency Medicine | Admitting: Emergency Medicine

## 2013-05-21 ENCOUNTER — Encounter (HOSPITAL_COMMUNITY): Payer: Self-pay | Admitting: Emergency Medicine

## 2013-05-21 ENCOUNTER — Emergency Department (HOSPITAL_COMMUNITY): Payer: Medicare Other

## 2013-05-21 DIAGNOSIS — Z8719 Personal history of other diseases of the digestive system: Secondary | ICD-10-CM | POA: Diagnosis not present

## 2013-05-21 DIAGNOSIS — J438 Other emphysema: Secondary | ICD-10-CM | POA: Diagnosis not present

## 2013-05-21 DIAGNOSIS — Z79899 Other long term (current) drug therapy: Secondary | ICD-10-CM | POA: Insufficient documentation

## 2013-05-21 DIAGNOSIS — I129 Hypertensive chronic kidney disease with stage 1 through stage 4 chronic kidney disease, or unspecified chronic kidney disease: Secondary | ICD-10-CM | POA: Insufficient documentation

## 2013-05-21 DIAGNOSIS — R51 Headache: Secondary | ICD-10-CM | POA: Insufficient documentation

## 2013-05-21 DIAGNOSIS — R209 Unspecified disturbances of skin sensation: Secondary | ICD-10-CM | POA: Diagnosis not present

## 2013-05-21 DIAGNOSIS — I2510 Atherosclerotic heart disease of native coronary artery without angina pectoris: Secondary | ICD-10-CM | POA: Diagnosis not present

## 2013-05-21 DIAGNOSIS — E785 Hyperlipidemia, unspecified: Secondary | ICD-10-CM | POA: Diagnosis not present

## 2013-05-21 DIAGNOSIS — G709 Myoneural disorder, unspecified: Secondary | ICD-10-CM | POA: Insufficient documentation

## 2013-05-21 DIAGNOSIS — N189 Chronic kidney disease, unspecified: Secondary | ICD-10-CM | POA: Insufficient documentation

## 2013-05-21 DIAGNOSIS — G40909 Epilepsy, unspecified, not intractable, without status epilepticus: Secondary | ICD-10-CM | POA: Insufficient documentation

## 2013-05-21 DIAGNOSIS — R002 Palpitations: Secondary | ICD-10-CM | POA: Insufficient documentation

## 2013-05-21 DIAGNOSIS — G4730 Sleep apnea, unspecified: Secondary | ICD-10-CM | POA: Insufficient documentation

## 2013-05-21 DIAGNOSIS — M129 Arthropathy, unspecified: Secondary | ICD-10-CM | POA: Diagnosis not present

## 2013-05-21 DIAGNOSIS — Z87891 Personal history of nicotine dependence: Secondary | ICD-10-CM | POA: Diagnosis not present

## 2013-05-21 DIAGNOSIS — R42 Dizziness and giddiness: Secondary | ICD-10-CM | POA: Diagnosis not present

## 2013-05-21 DIAGNOSIS — R079 Chest pain, unspecified: Secondary | ICD-10-CM | POA: Diagnosis not present

## 2013-05-21 DIAGNOSIS — E119 Type 2 diabetes mellitus without complications: Secondary | ICD-10-CM | POA: Insufficient documentation

## 2013-05-21 LAB — COMPREHENSIVE METABOLIC PANEL
Albumin: 3.8 g/dL (ref 3.5–5.2)
Alkaline Phosphatase: 110 U/L (ref 39–117)
BUN: 15 mg/dL (ref 6–23)
Calcium: 9.4 mg/dL (ref 8.4–10.5)
Creatinine, Ser: 0.88 mg/dL (ref 0.50–1.35)
GFR calc Af Amer: >90 mL/min (ref >90)
Glucose, Bld: 127 mg/dL — ABNORMAL HIGH (ref 70–99)
Total Protein: 7.1 g/dL (ref 6.0–8.3)

## 2013-05-21 LAB — POCT I-STAT TROPONIN I: Troponin i, poc: 0 ng/mL (ref 0.00–0.08)

## 2013-05-21 LAB — CBC WITH DIFFERENTIAL/PLATELET
Basophils Relative: 1 % (ref 0–1)
Eosinophils Absolute: 0.6 10*3/uL (ref 0.0–0.7)
Eosinophils Relative: 7 % — ABNORMAL HIGH (ref 0–5)
Hemoglobin: 13.1 g/dL (ref 13.0–17.0)
Lymphs Abs: 2.7 10*3/uL (ref 0.7–4.0)
MCH: 31 pg (ref 26.0–34.0)
MCHC: 36.4 g/dL — ABNORMAL HIGH (ref 30.0–36.0)
MCV: 85.1 fL (ref 78.0–100.0)
Monocytes Relative: 9 % (ref 3–12)
RBC: 4.23 MIL/uL (ref 4.22–5.81)

## 2013-05-21 LAB — PHENYTOIN LEVEL, TOTAL: Phenytoin Lvl: 4 ug/mL — ABNORMAL LOW (ref 10.0–20.0)

## 2013-05-21 MED ORDER — PHENYTOIN SODIUM EXTENDED 100 MG PO CAPS
300.0000 mg | ORAL_CAPSULE | Freq: Once | ORAL | Status: AC
  Administered 2013-05-21: 300 mg via ORAL
  Filled 2013-05-21: qty 3

## 2013-05-21 NOTE — ED Notes (Signed)
Per EMS, Pt was awake this morning and had sudden onset of sub-sternal non radiating chest pain. Pt states he felt dizzy when it happened. Denies n/v, sob, diaphoresis. Upon EMS arrival pt stated the pain was going away. During transport to ED pain came back. EMS gave 1 nitro and 324 asa.  BP 144/87 HR 74 RR 16 96% RA CBG 113.

## 2013-05-21 NOTE — ED Notes (Signed)
Pt. returned from XR. 

## 2013-05-21 NOTE — ED Provider Notes (Signed)
History    CSN: [id number] Arrival date & time 05/21/13  4098  First MD Initiated Contact with Patient 05/21/13 [phone]     Chief Complaint  Patient presents with  . Chest Pain   (Consider location/radiation/quality/duration/timing/severity/associated sxs/prior Treatment) HPI Comments: Patient reports he was sitting up in his armchair this morning when he develop burning pain over the top of his head, lasting approximately 45 minutes, then felt his heart race increase, then developed sharp central chest pain, nonradiating, lasting 35-45 minutes.  No associated symptoms.  He pressed his life alert button to come to the ED.  He is symptom free at present. Denies fever, chills, body aches, SOB, cough, nausea, lightheadedness, clamminess, leg swelling, recent illness.  Has had similar symptoms recurrently "since the weather turned hot." Pt had a cardiac cath 12/11/12 that showed minimal nonobstructive disease and normal LV function.  Card- [employer], English as a foreign language).  PCP [place].  Denies recent immobilization.  Denies hx blood clots.   Patient is a 67 y.o. male presenting with chest pain. The history is provided by the patient.  Chest Pain Associated symptoms: headache and palpitations   Associated symptoms: no abdominal pain, no cough, no diaphoresis, no fever, no nausea, no numbness, no shortness of breath, not vomiting and no weakness    Past Medical History  Diagnosis Date  . Colon polyp     Tubular Adenoma  . Hypertension 1999  . Arthritis   . Hyperlipidemia 1999  . Epilepsy   . Sleep apnea   . Chronic kidney disease   . Coronary artery disease, non-occlusive     Minimal nonobstructive CAD, nl LV systolic fxn by cath 12/11/12  . Goiter, nontoxic, multinodular     Thyroid US 11/2012  . Diabetes mellitus 2001    TYPE 2  . Neuromuscular disorder   . Splinter 04/27/13    metal plinter removed rt pointer finger   Past Surgical History  Procedure Laterality Date  . Knee surgery  2008   Left  . Cholecystectomy    . Inguinal hernia repair  06/27/11    left   Family History  Problem Relation Age of Onset  . Breast cancer Sister   . Colon cancer      unknown  . Diabetes Sister    History  Substance Use Topics  . Smoking status: Former Smoker    Types: Cigars    Quit date: 04/14/1986  . Smokeless tobacco: Never Used     Comment: Quit cigars in the 1980s  . Alcohol Use: No    Review of Systems  Constitutional: Negative for fever, chills and diaphoresis.  HENT: Negative for sore throat.   Respiratory: Negative for cough and shortness of breath.   Cardiovascular: Positive for chest pain and palpitations. Negative for leg swelling.  Gastrointestinal: Negative for nausea, vomiting, abdominal pain, diarrhea and blood in stool.  Genitourinary: Negative for dysuria, urgency and frequency.  Neurological: Positive for headaches. Negative for syncope, weakness, light-headedness and numbness.    Allergies  Review of patient's allergies indicates no known allergies.  Home Medications   Current Outpatient Rx  Name  Route  Sig  Dispense  Refill  . amLODipine (NORVASC) 5 MG tablet   Oral   Take 5 mg by mouth every morning.          . Cyanocobalamin (VITAMIN B-12 CR PO)   Oral   Take 1 tablet by mouth daily.         Marland Kitchen escitalopram (LEXAPRO) 10 MG  tablet   Oral   Take 10 mg by mouth every morning.         . glyBURIDE-metformin (GLUCOVANCE) 2.5-500 MG per tablet   Oral   Take 1 tablet by mouth 2 (two) times daily with a meal.         . levETIRAcetam (KEPPRA) 500 MG tablet   Oral   Take 500 mg by mouth 2 (two) times daily.           . Multiple Vitamin (MULTIVITAMIN WITH MINERALS) TABS   Oral   Take 1 tablet by mouth daily.         . niacin 250 MG tablet   Oral   Take 250 mg by mouth daily.           Marland Kitchen omeprazole (PRILOSEC) 20 MG capsule   Oral   Take 1 capsule (20 mg total) by mouth daily.   30 capsule   11   . phenytoin (DILANTIN) 100  MG ER capsule   Oral   Take 400 mg by mouth at bedtime as needed (for seizures).          . potassium chloride SA (K-DUR,KLOR-CON) 20 MEQ tablet   Oral   Take 20 mEq by mouth 2 (two) times daily.         . sodium chloride (OCEAN) 0.65 % nasal spray   Nasal   Place 1 spray into the nose daily as needed for congestion (if cpap not available).         . tamsulosin (FLOMAX) 0.4 MG CAPS   Oral   Take 0.4 mg by mouth daily.         . vitamin C (ASCORBIC ACID) 500 MG tablet   Oral   Take 500 mg by mouth daily.          BP 157/83  Pulse 72  Temp(Src) 98.9 F (37.2 C) (Oral)  Resp 17  SpO2 99% Physical Exam  Nursing note and vitals reviewed. Constitutional: He appears well-developed and well-nourished. No distress.  HENT:  Head: Normocephalic and atraumatic.  Neck: Neck supple.  Cardiovascular: Normal rate, regular rhythm and intact distal pulses.   Pulmonary/Chest: Effort normal and breath sounds normal. No respiratory distress. He has no wheezes. He has no rales.  Abdominal: Soft. He exhibits no distension and no mass. There is no tenderness. There is no rebound and no guarding.  Musculoskeletal: He exhibits no edema.  Neurological: He is alert. He exhibits normal muscle tone.  Skin: He is not diaphoretic.    ED Course  Procedures (including critical care time) Labs Reviewed  CBC WITH DIFFERENTIAL - Abnormal; Notable for the following:    HCT 36.0 (*)    MCHC 36.4 (*)    Platelets 149 (*)    Eosinophils Relative 7 (*)    All other components within normal limits  COMPREHENSIVE METABOLIC PANEL - Abnormal; Notable for the following:    Glucose, Bld 127 (*)    GFR calc non Af Amer 87 (*)    All other components within normal limits  POCT I-STAT TROPONIN I   Dg Chest 2 View  05/21/2013   *RADIOLOGY REPORT*  Clinical Data: Chest pain and headache.  CHEST - 2 VIEW  Comparison: 12/10/2012  Findings: Pulmonary hyperinflation consistent with emphysema. Normal heart  size and pulmonary vascularity.  No focal airspace consolidation in the lungs.  No blunting of costophrenic angles. No pneumothorax.  Displacement of the trachea towards the right suggest thyroid goiter.  Old right rib fractures.  Degenerative changes in the thoracic spine.  No significant changes since the previous study.  IMPRESSION: Probable emphysematous changes in the lungs.  No evidence of active pulmonary disease.   Original Report Authenticated By: Burman Nieves, M.D.    6:41 AM Dr Rulon Abide made aware of the patient.     Date: 05/21/2013  Rate: 73  Rhythm: normal sinus rhythm  QRS Axis: normal  Intervals: normal  ST/T Wave abnormalities: ST elevations laterally and ST depressions inferiorly  Conduction Disutrbances:none  Narrative Interpretation:   Old EKG Reviewed: unchanged   8:03 AM Discussed pt with Dr Freida Busman, who will see the patient.  Per discussion with Dr Freida Busman, pt has been having these symptoms for awhile now.  Will add dilantin level.  If normal, pt to be d/c home.    1. Chest pain   2. Headache     MDM   Pt with hx HTN and January 2014 clean catheterization p/w chest pain that was preceded by"burning" sensation over scalp and palpitations.  Pt in normal sinus rhythm, unchanged EKG, negative troponin.  No risk factors for PE, no s/s DVT. Dilantin level is low, will give home dose in ED. Pt remained asymptomatic throughout visit.  Pt also seen and evaluated by Dr Freida Busman.  Pt d/c home with PCP follow up, he has appt scheduled with Dr Waynard Edwards.  Discussed all results with patient.  Pt given return precautions.  Pt verbalizes understanding and agrees with plan.     [place], PA-C 05/21/13 [phone]

## 2013-05-21 NOTE — ED Notes (Signed)
Pt transported to XR.  

## 2013-05-21 NOTE — ED Provider Notes (Signed)
Medical screening examination/treatment/procedure(s) were conducted as a shared visit with non-physician practitioner(s) and myself.  I personally evaluated the patient during the encounter  Patient describes multiple episodes of head burning radiating to his neck and chest. Has seen his Dr. For same and without a diagnosis. Had a recent cardiac cath 5 months ago which was essentially negative. This does not psych ACS. No concern for stroke. Will check labs and blood work in the discharge  Toy Baker, MD 05/21/13 [phone]

## 2013-05-25 NOTE — ED Provider Notes (Signed)
Medical screening examination/treatment/procedure(s) were performed by non-physician practitioner and as supervising physician I was immediately available for consultation/collaboration.  Toy Baker, MD 05/25/13 [phone]

## 2013-06-03 DIAGNOSIS — E119 Type 2 diabetes mellitus without complications: Secondary | ICD-10-CM | POA: Diagnosis not present

## 2013-06-03 DIAGNOSIS — I10 Essential (primary) hypertension: Secondary | ICD-10-CM | POA: Diagnosis not present

## 2013-06-03 DIAGNOSIS — Z1331 Encounter for screening for depression: Secondary | ICD-10-CM | POA: Diagnosis not present

## 2013-06-03 DIAGNOSIS — Z6829 Body mass index (BMI) 29.0-29.9, adult: Secondary | ICD-10-CM | POA: Diagnosis not present

## 2013-06-03 DIAGNOSIS — R569 Unspecified convulsions: Secondary | ICD-10-CM | POA: Diagnosis not present

## 2013-06-03 DIAGNOSIS — E785 Hyperlipidemia, unspecified: Secondary | ICD-10-CM | POA: Diagnosis not present

## 2013-06-04 DIAGNOSIS — M79609 Pain in unspecified limb: Secondary | ICD-10-CM | POA: Diagnosis not present

## 2013-06-04 DIAGNOSIS — B351 Tinea unguium: Secondary | ICD-10-CM | POA: Diagnosis not present

## 2013-06-10 DIAGNOSIS — E119 Type 2 diabetes mellitus without complications: Secondary | ICD-10-CM | POA: Diagnosis not present

## 2013-06-10 DIAGNOSIS — H353190 Nonexudative age-related macular degeneration, unspecified eye, stage unspecified: Secondary | ICD-10-CM | POA: Diagnosis not present

## 2013-06-10 DIAGNOSIS — H40009 Preglaucoma, unspecified, unspecified eye: Secondary | ICD-10-CM | POA: Diagnosis not present

## 2013-06-10 DIAGNOSIS — H11159 Pinguecula, unspecified eye: Secondary | ICD-10-CM | POA: Diagnosis not present

## 2013-06-12 DIAGNOSIS — N200 Calculus of kidney: Secondary | ICD-10-CM | POA: Diagnosis not present

## 2013-09-03 ENCOUNTER — Ambulatory Visit: Payer: Self-pay | Admitting: Podiatry

## 2013-09-29 ENCOUNTER — Ambulatory Visit (INDEPENDENT_AMBULATORY_CARE_PROVIDER_SITE_OTHER): Payer: Medicare Other | Admitting: Podiatry

## 2013-09-29 ENCOUNTER — Encounter: Payer: Self-pay | Admitting: Podiatry

## 2013-09-29 VITALS — BP 186/101 | HR 73 | Resp 24 | Ht 70.0 in | Wt 203.0 lb

## 2013-09-29 DIAGNOSIS — B351 Tinea unguium: Secondary | ICD-10-CM

## 2013-09-29 DIAGNOSIS — M79609 Pain in unspecified limb: Secondary | ICD-10-CM

## 2013-09-29 NOTE — Progress Notes (Signed)
Patient ID: Arthur Berry, male   DOB: 08-05-1946, 67 y.o.   MRN: [id number] Subjective: This 67 year old diabetic white male presents for ongoing debridement of painful mycotic toenails.  Objective: Incurvated, hypertrophic, discolored toenails with palpable tenderness in all nail plates.  Assessment: Symptomatic onychomycoses Diabetes  Plan: All 67 toenails are debrided back without any bleeding. Reappoint at three-month intervals.

## 2013-09-29 NOTE — Patient Instructions (Signed)
Apply topical antibiotic to the left great toenail area daily until healed.

## 2013-10-27 DIAGNOSIS — E119 Type 2 diabetes mellitus without complications: Secondary | ICD-10-CM | POA: Diagnosis not present

## 2013-10-27 DIAGNOSIS — G4733 Obstructive sleep apnea (adult) (pediatric): Secondary | ICD-10-CM | POA: Diagnosis not present

## 2013-10-27 DIAGNOSIS — E785 Hyperlipidemia, unspecified: Secondary | ICD-10-CM | POA: Diagnosis not present

## 2013-10-27 DIAGNOSIS — R569 Unspecified convulsions: Secondary | ICD-10-CM | POA: Diagnosis not present

## 2013-10-27 DIAGNOSIS — Z6829 Body mass index (BMI) 29.0-29.9, adult: Secondary | ICD-10-CM | POA: Diagnosis not present

## 2013-10-27 DIAGNOSIS — R809 Proteinuria, unspecified: Secondary | ICD-10-CM | POA: Diagnosis not present

## 2013-10-27 DIAGNOSIS — I10 Essential (primary) hypertension: Secondary | ICD-10-CM | POA: Diagnosis not present

## 2013-12-29 ENCOUNTER — Ambulatory Visit: Payer: BLUE CROSS/BLUE SHIELD | Admitting: Podiatry

## 2013-12-29 ENCOUNTER — Ambulatory Visit: Payer: Medicare Other | Admitting: Podiatry

## 2014-01-12 ENCOUNTER — Ambulatory Visit: Payer: Medicare Other | Admitting: Podiatry

## 2014-01-26 DIAGNOSIS — R569 Unspecified convulsions: Secondary | ICD-10-CM | POA: Diagnosis not present

## 2014-01-26 DIAGNOSIS — E785 Hyperlipidemia, unspecified: Secondary | ICD-10-CM | POA: Diagnosis not present

## 2014-01-26 DIAGNOSIS — E119 Type 2 diabetes mellitus without complications: Secondary | ICD-10-CM | POA: Diagnosis not present

## 2014-01-26 DIAGNOSIS — Z125 Encounter for screening for malignant neoplasm of prostate: Secondary | ICD-10-CM | POA: Diagnosis not present

## 2014-01-26 DIAGNOSIS — I10 Essential (primary) hypertension: Secondary | ICD-10-CM | POA: Diagnosis not present

## 2014-02-02 DIAGNOSIS — E669 Obesity, unspecified: Secondary | ICD-10-CM | POA: Diagnosis not present

## 2014-02-02 DIAGNOSIS — I10 Essential (primary) hypertension: Secondary | ICD-10-CM | POA: Diagnosis not present

## 2014-02-02 DIAGNOSIS — E876 Hypokalemia: Secondary | ICD-10-CM | POA: Diagnosis not present

## 2014-02-02 DIAGNOSIS — R972 Elevated prostate specific antigen [PSA]: Secondary | ICD-10-CM | POA: Diagnosis not present

## 2014-02-02 DIAGNOSIS — E785 Hyperlipidemia, unspecified: Secondary | ICD-10-CM | POA: Diagnosis not present

## 2014-02-02 DIAGNOSIS — E119 Type 2 diabetes mellitus without complications: Secondary | ICD-10-CM | POA: Diagnosis not present

## 2014-02-02 DIAGNOSIS — IMO0002 Reserved for concepts with insufficient information to code with codable children: Secondary | ICD-10-CM | POA: Diagnosis not present

## 2014-02-02 DIAGNOSIS — Z0000 Encounter for general adult medical examination without abnormal findings: Secondary | ICD-10-CM | POA: Diagnosis not present

## 2014-02-09 ENCOUNTER — Encounter: Payer: Self-pay | Admitting: Podiatry

## 2014-02-09 ENCOUNTER — Ambulatory Visit (INDEPENDENT_AMBULATORY_CARE_PROVIDER_SITE_OTHER): Payer: Medicare Other | Admitting: Podiatry

## 2014-02-09 VITALS — BP 146/88 | HR 74 | Resp 12

## 2014-02-09 DIAGNOSIS — M79609 Pain in unspecified limb: Secondary | ICD-10-CM | POA: Diagnosis not present

## 2014-02-09 DIAGNOSIS — B351 Tinea unguium: Secondary | ICD-10-CM

## 2014-02-10 NOTE — Progress Notes (Signed)
Patient ID: Arthur BurySamuel W Kleckley, male   DOB: 06-Feb-1946, 68 y.o.   MRN: [id number]   Subjective: This 68 year old diabetic white male presents for ongoing debridement of painful mycotic toenails.   Objective: Incurvated, hypertrophic, discolored toenails with palpable tenderness in all nail plates.   Assessment: Symptomatic onychomycoses   Diabetes  Plan: All 10 toenails are debrided back without any bleeding. Reappoint at three-month intervals.

## 2014-02-16 DIAGNOSIS — R569 Unspecified convulsions: Secondary | ICD-10-CM | POA: Diagnosis not present

## 2014-02-16 DIAGNOSIS — Z79899 Other long term (current) drug therapy: Secondary | ICD-10-CM | POA: Diagnosis not present

## 2014-02-16 DIAGNOSIS — E119 Type 2 diabetes mellitus without complications: Secondary | ICD-10-CM | POA: Diagnosis not present

## 2014-04-16 ENCOUNTER — Encounter (HOSPITAL_COMMUNITY): Payer: Self-pay | Admitting: Emergency Medicine

## 2014-04-16 DIAGNOSIS — R1013 Epigastric pain: Secondary | ICD-10-CM | POA: Diagnosis not present

## 2014-04-16 DIAGNOSIS — Z79899 Other long term (current) drug therapy: Secondary | ICD-10-CM | POA: Insufficient documentation

## 2014-04-16 DIAGNOSIS — E785 Hyperlipidemia, unspecified: Secondary | ICD-10-CM | POA: Diagnosis not present

## 2014-04-16 DIAGNOSIS — M129 Arthropathy, unspecified: Secondary | ICD-10-CM | POA: Diagnosis not present

## 2014-04-16 DIAGNOSIS — Z9089 Acquired absence of other organs: Secondary | ICD-10-CM | POA: Diagnosis not present

## 2014-04-16 DIAGNOSIS — Z87891 Personal history of nicotine dependence: Secondary | ICD-10-CM | POA: Diagnosis not present

## 2014-04-16 DIAGNOSIS — Z7982 Long term (current) use of aspirin: Secondary | ICD-10-CM | POA: Diagnosis not present

## 2014-04-16 DIAGNOSIS — Z9889 Other specified postprocedural states: Secondary | ICD-10-CM | POA: Insufficient documentation

## 2014-04-16 DIAGNOSIS — N189 Chronic kidney disease, unspecified: Secondary | ICD-10-CM | POA: Insufficient documentation

## 2014-04-16 DIAGNOSIS — E119 Type 2 diabetes mellitus without complications: Secondary | ICD-10-CM | POA: Insufficient documentation

## 2014-04-16 DIAGNOSIS — I2510 Atherosclerotic heart disease of native coronary artery without angina pectoris: Secondary | ICD-10-CM | POA: Diagnosis not present

## 2014-04-16 DIAGNOSIS — K219 Gastro-esophageal reflux disease without esophagitis: Secondary | ICD-10-CM | POA: Diagnosis not present

## 2014-04-16 DIAGNOSIS — I10 Essential (primary) hypertension: Secondary | ICD-10-CM | POA: Diagnosis not present

## 2014-04-16 DIAGNOSIS — I129 Hypertensive chronic kidney disease with stage 1 through stage 4 chronic kidney disease, or unspecified chronic kidney disease: Secondary | ICD-10-CM | POA: Insufficient documentation

## 2014-04-16 DIAGNOSIS — Z87828 Personal history of other (healed) physical injury and trauma: Secondary | ICD-10-CM | POA: Diagnosis not present

## 2014-04-16 LAB — COMPREHENSIVE METABOLIC PANEL
ALBUMIN: 3.9 g/dL (ref 3.5–5.2)
ALK PHOS: 111 U/L (ref 39–117)
ALT: 19 U/L (ref 0–53)
AST: 20 U/L (ref 0–37)
BILIRUBIN TOTAL: 0.3 mg/dL (ref 0.3–1.2)
BUN: 19 mg/dL (ref 6–23)
CHLORIDE: 96 meq/L (ref 96–112)
CO2: 32 mEq/L (ref 19–32)
CREATININE: 0.85 mg/dL (ref 0.50–1.35)
Calcium: 9.6 mg/dL (ref 8.4–10.5)
GFR calc non Af Amer: 88 mL/min — ABNORMAL LOW (ref >90)
GLUCOSE: 286 mg/dL — AB (ref 70–99)
Potassium: 3.5 mEq/L — ABNORMAL LOW (ref 3.7–5.3)
Sodium: 140 mEq/L (ref 137–147)
Total Protein: 7.5 g/dL (ref 6.0–8.3)

## 2014-04-16 LAB — CBC WITH DIFFERENTIAL/PLATELET
BASOS PCT: 1 % (ref 0–1)
Basophils Absolute: 0.1 10*3/uL (ref 0.0–0.1)
EOS ABS: 0.5 10*3/uL (ref 0.0–0.7)
Eosinophils Relative: 5 % (ref 0–5)
HEMATOCRIT: 37.5 % — AB (ref 39.0–52.0)
HEMOGLOBIN: 13.4 g/dL (ref 13.0–17.0)
LYMPHS ABS: 2.4 10*3/uL (ref 0.7–4.0)
Lymphocytes Relative: 25 % (ref 12–46)
MCH: 31.3 pg (ref 26.0–34.0)
MCHC: 35.7 g/dL (ref 30.0–36.0)
MCV: 87.6 fL (ref 78.0–100.0)
MONO ABS: 0.7 10*3/uL (ref 0.1–1.0)
MONOS PCT: 8 % (ref 3–12)
NEUTROS ABS: 6 10*3/uL (ref 1.7–7.7)
Neutrophils Relative %: 61 % (ref 43–77)
Platelets: 157 10*3/uL (ref 150–400)
RBC: 4.28 MIL/uL (ref 4.22–5.81)
RDW: 13.3 % (ref 11.5–15.5)
WBC: 9.6 10*3/uL (ref 4.0–10.5)

## 2014-04-16 LAB — LIPASE, BLOOD: LIPASE: 41 U/L (ref 11–59)

## 2014-04-16 NOTE — ED Notes (Signed)
Pt. reports black stools with " burning" sensation at throat and mid abdomen for 1 week .

## 2014-04-17 ENCOUNTER — Emergency Department (HOSPITAL_COMMUNITY)
Admission: EM | Admit: 2014-04-17 | Discharge: 2014-04-17 | Disposition: A | Discharge status: Home / Self Care | Payer: Medicare Other | Attending: Emergency Medicine | Admitting: Emergency Medicine

## 2014-04-17 DIAGNOSIS — K219 Gastro-esophageal reflux disease without esophagitis: Secondary | ICD-10-CM

## 2014-04-17 LAB — URINALYSIS, ROUTINE W REFLEX MICROSCOPIC
BILIRUBIN URINE: NEGATIVE
Glucose, UA: >1000 mg/dL — AB
Hgb urine dipstick: NEGATIVE
KETONES UR: NEGATIVE mg/dL
LEUKOCYTES UA: NEGATIVE
NITRITE: NEGATIVE
PH: 6 (ref 5.0–8.0)
PROTEIN: NEGATIVE mg/dL
Specific Gravity, Urine: 1.035 — ABNORMAL HIGH (ref 1.005–1.030)
UROBILINOGEN UA: 0.2 mg/dL (ref 0.0–1.0)

## 2014-04-17 LAB — POC OCCULT BLOOD, ED: Fecal Occult Bld: NEGATIVE

## 2014-04-17 LAB — URINE MICROSCOPIC-ADD ON

## 2014-04-17 MED ORDER — GI COCKTAIL ~~LOC~~
30.0000 mL | Freq: Once | ORAL | Status: AC
  Administered 2014-04-17: 30 mL via ORAL
  Filled 2014-04-17: qty 30

## 2014-04-17 MED ORDER — PANTOPRAZOLE SODIUM 40 MG PO TBEC: 40.0000 mg | DELAYED_RELEASE_TABLET | Freq: Every day | ORAL | Status: DC

## 2014-04-17 MED ORDER — PANTOPRAZOLE SODIUM 40 MG PO TBEC
40.0000 mg | DELAYED_RELEASE_TABLET | Freq: Once | ORAL | Status: AC
  Administered 2014-04-17: 40 mg via ORAL
  Filled 2014-04-17: qty 1

## 2014-04-17 NOTE — Discharge Instructions (Signed)
Your workup today did not show any signs of blood loss, and no blood was noted in your stool.  Given your symptoms, please see your gastroenterologist next week for recheck.  Take medications as prescribed.  Return to the ER for worsening condition or new concerning symptoms.   Diet for Gastroesophageal Reflux Disease, Adult Reflux (acid reflux) is when acid from your stomach flows up into the esophagus. When acid comes in contact with the esophagus, the acid causes irritation and soreness (inflammation) in the esophagus. When reflux happens often or so severely that it causes damage to the esophagus, it is called gastroesophageal reflux disease (GERD). Nutrition therapy can help ease the discomfort of GERD. FOODS OR DRINKS TO AVOID OR LIMIT  Smoking or chewing tobacco. Nicotine is one of the most potent stimulants to acid production in the gastrointestinal tract.  Caffeinated and decaffeinated coffee and black tea.  Regular or low-calorie carbonated beverages or energy drinks (caffeine-free carbonated beverages are allowed).   Strong spices, such as black pepper, white pepper, red pepper, cayenne, curry powder, and chili powder.  Peppermint or spearmint.  Chocolate.  High-fat foods, including meats and fried foods. Extra added fats including oils, butter, salad dressings, and nuts. Limit these to less than 8 tsp per day.  Fruits and vegetables if they are not tolerated, such as citrus fruits or tomatoes.  Alcohol.  Any food that seems to aggravate your condition. If you have questions regarding your diet, call your caregiver or a registered dietitian. OTHER THINGS THAT MAY HELP GERD INCLUDE:   Eating your meals slowly, in a relaxed setting.  Eating 5 to 6 small meals per day instead of 3 large meals.  Eliminating food for a period of time if it causes distress.  Not lying down until 3 hours after eating a meal.  Keeping the head of your bed raised 6 to 9 inches (15 to 23 cm) by  using a foam wedge or blocks under the legs of the bed. Lying flat may make symptoms worse.  Being physically active. Weight loss may be helpful in reducing reflux in overweight or obese adults.  Wear loose fitting clothing EXAMPLE MEAL PLAN This meal plan is approximately 2,000 calories based on https://www.[place].org/ChooseMyPlate.gov meal planning guidelines. Breakfast   cup cooked oatmeal.  1 cup strawberries.  1 cup low-fat milk.  1 oz almonds. Snack  1 cup cucumber slices.  6 oz yogurt (made from low-fat or fat-free milk). Lunch  2 slice whole-wheat bread.  2 oz sliced [place].  2 tsp mayonnaise.  1 cup blueberries.  1 cup snap peas. Snack  6 whole-wheat crackers.  1 oz string cheese. Dinner   cup brown rice.  1 cup mixed veggies.  1 tsp olive oil.  3 oz grilled fish. Document Released: 10/30/2005 Document Revised: 01/22/2012 Document Reviewed: 09/15/2011 [facility] Patient Information 2014 [place], [place].  Gastroesophageal Reflux Disease, Adult Gastroesophageal reflux disease (GERD) happens when acid from your stomach flows up into the esophagus. When acid comes in contact with the esophagus, the acid causes soreness (inflammation) in the esophagus. Over time, GERD may create small holes (ulcers) in the lining of the esophagus. CAUSES   Increased body weight. This puts pressure on the stomach, making acid rise from the stomach into the esophagus.  Smoking. This increases acid production in the stomach.  Drinking alcohol. This causes decreased pressure in the lower esophageal sphincter (valve or ring of muscle between the esophagus and stomach), allowing acid from the stomach into the  esophagus.  Late evening meals and a full stomach. This increases pressure and acid production in the stomach.  A malformed lower esophageal sphincter. Sometimes, no cause is found. SYMPTOMS   Burning pain in the lower part of the mid-chest behind the breastbone and in the mid-stomach  area. This may occur twice a week or more often.  Trouble swallowing.  Sore throat.  Dry cough.  Asthma-like symptoms including chest tightness, shortness of breath, or wheezing. DIAGNOSIS  Your caregiver may be able to diagnose GERD based on your symptoms. In some cases, X-rays and other tests may be done to check for complications or to check the condition of your stomach and esophagus. TREATMENT  Your caregiver may recommend over-the-counter or prescription medicines to help decrease acid production. Ask your caregiver before starting or adding any new medicines.  HOME CARE INSTRUCTIONS   Change the factors that you can control. Ask your caregiver for guidance concerning weight loss, quitting smoking, and alcohol consumption.  Avoid foods and drinks that make your symptoms worse, such as:  Caffeine or alcoholic drinks.  Chocolate.  Peppermint or mint flavorings.  Garlic and onions.  Spicy foods.  Citrus fruits, such as oranges, lemons, or limes.  Tomato-based foods such as sauce, chili, salsa, and pizza.  Fried and fatty foods.  Avoid lying down for the 3 hours prior to your bedtime or prior to taking a nap.  Eat small, frequent meals instead of large meals.  Wear loose-fitting clothing. Do not wear anything tight around your waist that causes pressure on your stomach.  Raise the head of your bed 6 to 8 inches with wood blocks to help you sleep. Extra pillows will not help.  Only take over-the-counter or prescription medicines for pain, discomfort, or fever as directed by your caregiver.  Do not take aspirin, ibuprofen, or other nonsteroidal anti-inflammatory drugs (NSAIDs). SEEK IMMEDIATE MEDICAL CARE IF:   You have pain in your arms, neck, jaw, teeth, or back.  Your pain increases or changes in intensity or duration.  You develop nausea, vomiting, or sweating (diaphoresis).  You develop shortness of breath, or you faint.  Your vomit is green, yellow,  black, or looks like coffee grounds or blood.  Your stool is red, bloody, or black. These symptoms could be signs of other problems, such as heart disease, gastric bleeding, or esophageal bleeding. MAKE SURE YOU:   Understand these instructions.  Will watch your condition.  Will get help right away if you are not doing well or get worse. Document Released: 08/09/2005 Document Revised: 01/22/2012 Document Reviewed: 05/19/2011 [facility] Patient Information 2014 [place], [place].

## 2014-04-17 NOTE — ED Provider Notes (Signed)
CSN: [id number]     Arrival date & time 04/16/14  2055 History   First MD Initiated Contact with Patient 04/17/14 0021     Chief Complaint  Patient presents with  . Melena     (Consider location/radiation/quality/duration/timing/severity/associated sxs/prior Treatment) HPI 68 yo male presents to the ER from home with complaint of one week of pain from epigastrium to throat burning in nature.  Pain is intermittent, none currently.  Pt reports h/o similar on past, had been on prilosec but has not taken in some time.  Pt also reports dark/black stools x 1 week.  He denies any OTC medications such as iron, peptobismol, tums, etc.  No prior h/o GI bleeding.  No shortness of breath, weakness, dizziness.  No n/v/d.  Pt has had EGD and colonoscopy in the past.   Past Medical History  Diagnosis Date  . Colon polyp     Tubular Adenoma  . Hypertension 1999  . Arthritis   . Hyperlipidemia 1999  . Epilepsy   . Sleep apnea   . Chronic kidney disease   . Coronary artery disease, non-occlusive     Minimal nonobstructive CAD, nl LV systolic fxn by cath 12/11/12  . Goiter, nontoxic, multinodular     Thyroid US 11/2012  . Diabetes mellitus 2001    TYPE 2  . Neuromuscular disorder   . Splinter 04/27/13    metal plinter removed rt pointer finger   Past Surgical History  Procedure Laterality Date  . Knee surgery  2008    Left  . Cholecystectomy    . Inguinal hernia repair  06/27/11    left   Family History  Problem Relation Age of Onset  . Breast cancer Sister   . Colon cancer      unknown  . Diabetes Sister   . Heart disease Mother   . Alzheimer's disease Father    History  Substance Use Topics  . Smoking status: Former Smoker    Types: Cigars    Quit date: 04/14/1986  . Smokeless tobacco: Never Used     Comment: Quit cigars in the 1980s  . Alcohol Use: No    Review of Systems  Unable to perform ROS     Allergies  Review of patient's allergies indicates no known  allergies.  Home Medications   Prior to Admission medications   Medication Sig Start Date End Date Taking? Authorizing Provider  amLODipine (NORVASC) 5 MG tablet Take 5 mg by mouth every morning.    Yes Historical Provider, MD  aspirin EC 81 MG tablet Take 81 mg by mouth daily.   Yes Historical Provider, MD  atorvastatin (LIPITOR) 40 MG tablet Take 40 mg by mouth daily.   Yes Historical Provider, MD  benazepril (LOTENSIN) 40 MG tablet Take 40 mg by mouth daily.   Yes Historical Provider, MD  cholecalciferol (VITAMIN D) 1000 UNITS tablet Take 1,000 Units by mouth daily.   Yes Historical Provider, MD  Cyanocobalamin (VITAMIN B-12 CR PO) Take 1 tablet by mouth daily.   Yes Historical Provider, MD  escitalopram (LEXAPRO) 10 MG tablet Take 10 mg by mouth every morning.   Yes Historical Provider, MD  glyBURIDE-metformin (GLUCOVANCE) 2.5-500 MG per tablet Take 1 tablet by mouth 2 (two) times daily with a meal.   Yes Historical Provider, MD  levETIRAcetam (KEPPRA) 500 MG tablet Take 500 mg by mouth 2 (two) times daily.     Yes Historical Provider, MD  Multiple Vitamin (MULTIVITAMIN WITH MINERALS) TABS Take  1 tablet by mouth daily.   Yes Historical Provider, MD  niacin 250 MG tablet Take 250 mg by mouth daily.     Yes Historical Provider, MD  omeprazole (PRILOSEC) 20 MG capsule Take 1 capsule (20 mg total) by mouth daily. 09/04/12  Yes Hilarie Fredrickson, MD  phenytoin (DILANTIN) 100 MG ER capsule Take 100-400 mg by mouth at bedtime.    Yes Historical Provider, MD  potassium chloride SA (K-DUR,KLOR-CON) 20 MEQ tablet Take 20 mEq by mouth 2 (two) times daily.   Yes Historical Provider, MD  vitamin C (ASCORBIC ACID) 500 MG tablet Take 500 mg by mouth daily.   Yes Historical Provider, MD  pantoprazole (PROTONIX) 40 MG tablet Take 1 tablet (40 mg total) by mouth daily. 04/17/14   Olivia Mackie, MD   BP 169/88  Pulse 84  Temp(Src) 98.1 F (36.7 C) (Oral)  Resp 23  Ht 5\' 9"  (1.753 m)  Wt 192 lb (87.091 kg)   BMI 28.34 kg/m2  SpO2 99% Physical Exam  Nursing note and vitals reviewed. Constitutional: He is oriented to person, place, and time. He appears well-developed and well-nourished. No distress.  HENT:  Head: Normocephalic and atraumatic.  Nose: Nose normal.  Mouth/Throat: Oropharynx is clear and moist.  Eyes: Conjunctivae and EOM are normal. Pupils are equal, round, and reactive to light.  Neck: Normal range of motion. Neck supple. No JVD present. No tracheal deviation present. No thyromegaly present.  Cardiovascular: Normal rate, regular rhythm, normal heart sounds and intact distal pulses.  Exam reveals no gallop and no friction rub.   No murmur heard. Pulmonary/Chest: Effort normal and breath sounds normal. No stridor. No respiratory distress. He has no wheezes. He has no rales. He exhibits no tenderness.  Abdominal: Soft. Bowel sounds are normal. He exhibits no distension and no mass. There is no tenderness. There is no rebound and no guarding.  Genitourinary: Guaiac negative stool.  Musculoskeletal: Normal range of motion. He exhibits no edema and no tenderness.  Lymphadenopathy:    He has no cervical adenopathy.  Neurological: He is alert and oriented to person, place, and time. He exhibits normal muscle tone. Coordination normal.  Skin: Skin is warm and dry. No rash noted. No erythema. No pallor.  Psychiatric: He has a normal mood and affect. His behavior is normal. Judgment and thought content normal.    ED Course  Procedures (including critical care time) Labs Review Labs Reviewed  CBC WITH DIFFERENTIAL - Abnormal; Notable for the following:    HCT 37.5 (*)    All other components within normal limits  COMPREHENSIVE METABOLIC PANEL - Abnormal; Notable for the following:    Potassium 3.5 (*)    Glucose, Bld 286 (*)    GFR calc non Af Amer 88 (*)    All other components within normal limits  URINALYSIS, ROUTINE W REFLEX MICROSCOPIC - Abnormal; Notable for the following:     Specific Gravity, Urine 1.035 (*)    Glucose, UA >1000 (*)    All other components within normal limits  LIPASE, BLOOD  URINE MICROSCOPIC-ADD ON  POC OCCULT BLOOD, ED    Imaging Review No results found.   EKG Interpretation None      MDM   Final diagnoses:  GERD (gastroesophageal reflux disease)    68 yo male with 1 week of burning pain to throat, chest, abd with black stools.  H/h stable, vitals stable.  Pt has good f/u with pcm and has been seen by  [place].  Plan to d/c home with good precautions to him and his brother with close f/u with GI.    Olivia Mackielga M Gradyn Shein, MD 04/17/14 [phone]

## 2014-04-23 ENCOUNTER — Ambulatory Visit (INDEPENDENT_AMBULATORY_CARE_PROVIDER_SITE_OTHER): Payer: Medicare Other | Admitting: Gastroenterology

## 2014-04-23 ENCOUNTER — Encounter: Payer: Self-pay | Admitting: Gastroenterology

## 2014-04-23 VITALS — BP 146/80 | HR 52 | Ht 69.0 in | Wt 197.0 lb

## 2014-04-23 DIAGNOSIS — K219 Gastro-esophageal reflux disease without esophagitis: Secondary | ICD-10-CM | POA: Diagnosis not present

## 2014-04-23 HISTORY — DX: Gastro-esophageal reflux disease without esophagitis: K21.9

## 2014-04-23 MED ORDER — PANTOPRAZOLE SODIUM 40 MG PO TBEC: 40.0000 mg | DELAYED_RELEASE_TABLET | Freq: Two times a day (BID) | ORAL | Status: DC

## 2014-04-23 NOTE — Patient Instructions (Signed)
Increase Pantoprazole to one tablet twice daily   Follow up visit is scheduled with Dr. Marina Goodell for 06-09-2014 for 830 am

## 2014-04-23 NOTE — Progress Notes (Signed)
Agree with initial assessment and plans 

## 2014-04-23 NOTE — Progress Notes (Signed)
     04/23/2014 Arthur Berry [id number] 10-11-1946   History of Present Illness:  This is a 68 year old male who is known to Dr. Marina Goodell for previous EGD in December 2013 at which time he was found to have reflux esophagitis. He was previously on Prilosec, but states he had not been taking that regularly for quite some time. He was in the emergency department last week on June 5 for complaints of reflux and epigastric abdominal pain. He also reported that he was having black stools intermittently for quite some time. His hemoglobin was normal at 13.4 grams, which is also stable compared to one year ago. He was Hemoccult-negative in the emergency department.  He was placed on pantoprazole 40 mg daily and was given reflux dietary measures to follow. He says that he has been feeling better, but it is not feeling 100% as of yet. He has not had any black or dark-colored stools in the last 3-4 days; says that they are normal brown color now.  Denies taking any NSAID's at home.  Just of note, he had a colonoscopy in February 2011 at which time he was found to have mild diverticulosis and internal hemorrhoids.   Current Medications, Allergies, Past Medical History, Past Surgical History, Family History and Social History were reviewed in [employer] record.   Physical Exam: BP 146/80  Pulse 52  Ht 5\' 9"  (1.753 m)  Wt 197 lb (89.359 kg)  BMI 29.08 kg/m2 General: Well developed, white male in no acute distress Head: Normocephalic and atraumatic Eyes:  Sclerae anicteric, conjunctiva pink  Ears: Normal auditory acuity Lungs: Clear throughout to auscultation Heart:  Bradycardic but regular rhythm Abdomen: Soft, non-distended.  Normal bowel sounds.  Non-tender. Musculoskeletal: Symmetrical with no gross deformities  Extremities: No edema  Neurological: Alert oriented x 4, grossly non-focal Psychological:  Alert and cooperative. Normal mood and affect  Assessment and  Recommendations: -GERD with epigastric pain:  Reflux esophagitis seen on EGD in 10/2012. -Black stools:  Patient was heme negative in the ED and Hgb was normal.  *Will increase his pantoprazole to 40 mg daily for now to optimize symptom control.  Will likely be able to decrease back to once daily in 4-8 weeks.  Will schedule follow-up office visit in that time frame as well.

## 2014-05-11 ENCOUNTER — Encounter: Payer: Self-pay | Admitting: Podiatry

## 2014-05-11 ENCOUNTER — Ambulatory Visit (INDEPENDENT_AMBULATORY_CARE_PROVIDER_SITE_OTHER): Payer: Medicare Other | Admitting: Podiatry

## 2014-05-11 VITALS — BP 145/77 | HR 86 | Resp 18

## 2014-05-11 DIAGNOSIS — M79673 Pain in unspecified foot: Secondary | ICD-10-CM

## 2014-05-11 DIAGNOSIS — B351 Tinea unguium: Secondary | ICD-10-CM

## 2014-05-11 DIAGNOSIS — M79609 Pain in unspecified limb: Secondary | ICD-10-CM

## 2014-05-12 NOTE — Progress Notes (Signed)
Patient ID: Arthur Berry, male   DOB: [birth year], 68 y.o.   MRN: [id number]  Subjective: Orientated x3 white male presents complaining of painful toenails  Objective: Hypertrophic, incurvated, discolored toenails x10  Assessment: Symptomatic onychomycoses x10 Diabetes  Plan: Nails x10 are debrided without a bleeding  Reappoint at three-month intervals

## 2014-05-14 DIAGNOSIS — N200 Calculus of kidney: Secondary | ICD-10-CM | POA: Diagnosis not present

## 2014-05-14 DIAGNOSIS — N401 Enlarged prostate with lower urinary tract symptoms: Secondary | ICD-10-CM | POA: Diagnosis not present

## 2014-05-14 DIAGNOSIS — N138 Other obstructive and reflux uropathy: Secondary | ICD-10-CM | POA: Diagnosis not present

## 2014-05-14 DIAGNOSIS — R972 Elevated prostate specific antigen [PSA]: Secondary | ICD-10-CM | POA: Diagnosis not present

## 2014-05-20 DIAGNOSIS — I10 Essential (primary) hypertension: Secondary | ICD-10-CM | POA: Diagnosis not present

## 2014-05-20 DIAGNOSIS — E119 Type 2 diabetes mellitus without complications: Secondary | ICD-10-CM | POA: Diagnosis not present

## 2014-05-20 DIAGNOSIS — R569 Unspecified convulsions: Secondary | ICD-10-CM | POA: Diagnosis not present

## 2014-05-20 DIAGNOSIS — Z23 Encounter for immunization: Secondary | ICD-10-CM | POA: Diagnosis not present

## 2014-05-20 DIAGNOSIS — K219 Gastro-esophageal reflux disease without esophagitis: Secondary | ICD-10-CM | POA: Diagnosis not present

## 2014-05-20 DIAGNOSIS — R972 Elevated prostate specific antigen [PSA]: Secondary | ICD-10-CM | POA: Diagnosis not present

## 2014-05-20 DIAGNOSIS — Z6828 Body mass index (BMI) 28.0-28.9, adult: Secondary | ICD-10-CM | POA: Diagnosis not present

## 2014-06-09 ENCOUNTER — Encounter: Payer: Self-pay | Admitting: Internal Medicine

## 2014-06-09 ENCOUNTER — Ambulatory Visit (INDEPENDENT_AMBULATORY_CARE_PROVIDER_SITE_OTHER): Payer: Medicare Other | Admitting: Internal Medicine

## 2014-06-09 VITALS — BP 120/80 | HR 68 | Ht 69.0 in | Wt 195.0 lb

## 2014-06-09 DIAGNOSIS — K219 Gastro-esophageal reflux disease without esophagitis: Secondary | ICD-10-CM

## 2014-06-09 DIAGNOSIS — R1084 Generalized abdominal pain: Secondary | ICD-10-CM | POA: Diagnosis not present

## 2014-06-09 DIAGNOSIS — Z86010 Personal history of colonic polyps: Secondary | ICD-10-CM

## 2014-06-09 NOTE — Patient Instructions (Signed)
Follow up with Dr. Marina GoodellPerry as needed.  cc: Rodrigo RanMark Perini, MD

## 2014-06-09 NOTE — Progress Notes (Signed)
HISTORY OF PRESENT ILLNESS:  Arthur Berry is a 68 y.o. male with multiple medical problems as listed below. He presents today for followup regarding reflux symptoms, abdominal discomfort, and reports of dark stools (heme-negative with normal hemoglobin unchanged). He was evaluated in the emergency department on 04/17/2014 with complaints of reflux and epigastric pain. As well intermittent dark stools. He was placed on pantoprazole 40 mg daily. He is known to have GERD, but was not on PPI prior to that evaluation. He was seen by the physician assistant 04/23/2014. He was significantly better with some residual symptoms. PPI was increased to twice daily. He presents today for followup. He did undergo upper endoscopy 10/14/2012 for abdominal pain. This revealed mild reflux esophagitis but was otherwise normal. His last complete colonoscopy in February 2011 was negative for neoplasia. Patient reports that his symptoms have resolved. No new complaints.  REVIEW OF SYSTEMS:  All non-GI ROS negative upon comprehensive review  Past Medical History  Diagnosis Date  . Colon polyp 2005    Tubular Adenoma  . Hypertension 1999  . Arthritis   . Hyperlipidemia 1999  . Epilepsy   . Sleep apnea   . Chronic kidney disease   . Coronary artery disease, non-occlusive     Minimal nonobstructive CAD, nl LV systolic fxn by cath 12/11/12  . Goiter, nontoxic, multinodular     Thyroid US 11/2012  . Diabetes mellitus 2001    TYPE 2  . Neuromuscular disorder   . Splinter 04/27/13    metal plinter removed rt pointer finger  . GERD (gastroesophageal reflux disease) 04/23/2014  . Diverticulosis   . Internal hemorrhoids     Past Surgical History  Procedure Laterality Date  . Knee surgery  2008    Left  . Cholecystectomy    . Inguinal hernia repair  06/27/11    left  . Esophagogastroduodenoscopy  10/14/2012    normal   . Colonoscopy  12/28/2009    Social History Mendel RyderSamuel W Fennelly  reports that he quit smoking  about 28 years ago. His smoking use included Cigars. He has never used smokeless tobacco. He reports that he does not drink alcohol or use illicit drugs.  family history includes Alzheimer's disease in his father; Breast cancer in his sister; Colon cancer in an other family member; Diabetes in his sister; Heart disease in his mother.  No Known Allergies     PHYSICAL EXAMINATION: Vital signs: BP 120/80  Pulse 68  Ht 5\' 9"  (1.753 m)  Wt 195 lb (88.451 kg)  BMI 28.78 kg/m2 General: Well-developed, well-nourished, no acute distress HEENT: Sclerae are anicteric, conjunctiva pink. Oral mucosa intact Lungs: Clear Heart: Regular Abdomen: soft, nontender, nondistended, no obvious ascites, no peritoneal signs, normal bowel sounds. No organomegaly. Extremities: No edema Psychiatric: alert and oriented x3. Cooperative   ASSESSMENT:  #1. GERD. Recent exacerbation off PPI. Symptoms resolved on PPI. Previous EGD with mild esophagitis only #2. Reports of dark stools without evidence of GI bleeding (stable hemoglobin and Hemoccult negative stool) #3. Colonoscopy 2011 without neoplasia. Previous history of adenomatous colon polyps December 2005 #4. Multiple medical problems   PLAN:  #1. Reflux precautions #2. Continue PPI, but can decrease to 40 mg daily #3. Surveillance colonoscopy around February 2016 #4. Return to the care of your PCP. Interval GI followup as needed

## 2014-06-29 DIAGNOSIS — N139 Obstructive and reflux uropathy, unspecified: Secondary | ICD-10-CM | POA: Diagnosis not present

## 2014-06-29 DIAGNOSIS — N200 Calculus of kidney: Secondary | ICD-10-CM | POA: Diagnosis not present

## 2014-06-29 DIAGNOSIS — N138 Other obstructive and reflux uropathy: Secondary | ICD-10-CM | POA: Diagnosis not present

## 2014-06-29 DIAGNOSIS — R972 Elevated prostate specific antigen [PSA]: Secondary | ICD-10-CM | POA: Diagnosis not present

## 2014-06-29 DIAGNOSIS — N401 Enlarged prostate with lower urinary tract symptoms: Secondary | ICD-10-CM | POA: Diagnosis not present

## 2014-08-11 DIAGNOSIS — R4182 Altered mental status, unspecified: Secondary | ICD-10-CM | POA: Diagnosis not present

## 2014-08-11 DIAGNOSIS — R609 Edema, unspecified: Secondary | ICD-10-CM | POA: Diagnosis not present

## 2014-08-11 DIAGNOSIS — Z6829 Body mass index (BMI) 29.0-29.9, adult: Secondary | ICD-10-CM | POA: Diagnosis not present

## 2014-08-11 DIAGNOSIS — Z111 Encounter for screening for respiratory tuberculosis: Secondary | ICD-10-CM | POA: Diagnosis not present

## 2014-08-11 DIAGNOSIS — E119 Type 2 diabetes mellitus without complications: Secondary | ICD-10-CM | POA: Diagnosis not present

## 2014-08-11 DIAGNOSIS — I10 Essential (primary) hypertension: Secondary | ICD-10-CM | POA: Diagnosis not present

## 2014-08-11 DIAGNOSIS — E785 Hyperlipidemia, unspecified: Secondary | ICD-10-CM | POA: Diagnosis not present

## 2014-08-13 DIAGNOSIS — R569 Unspecified convulsions: Secondary | ICD-10-CM | POA: Diagnosis not present

## 2014-08-13 DIAGNOSIS — I10 Essential (primary) hypertension: Secondary | ICD-10-CM | POA: Diagnosis not present

## 2014-08-13 DIAGNOSIS — R4182 Altered mental status, unspecified: Secondary | ICD-10-CM | POA: Diagnosis not present

## 2014-08-13 DIAGNOSIS — E785 Hyperlipidemia, unspecified: Secondary | ICD-10-CM | POA: Diagnosis not present

## 2014-08-13 DIAGNOSIS — E119 Type 2 diabetes mellitus without complications: Secondary | ICD-10-CM | POA: Diagnosis not present

## 2014-08-13 DIAGNOSIS — Z6829 Body mass index (BMI) 29.0-29.9, adult: Secondary | ICD-10-CM | POA: Diagnosis not present

## 2014-08-13 DIAGNOSIS — Z111 Encounter for screening for respiratory tuberculosis: Secondary | ICD-10-CM | POA: Diagnosis not present

## 2014-08-13 DIAGNOSIS — G4733 Obstructive sleep apnea (adult) (pediatric): Secondary | ICD-10-CM | POA: Diagnosis not present

## 2014-08-17 ENCOUNTER — Ambulatory Visit: Payer: Medicare Other | Admitting: Podiatry

## 2014-08-18 DIAGNOSIS — Z6829 Body mass index (BMI) 29.0-29.9, adult: Secondary | ICD-10-CM | POA: Diagnosis not present

## 2014-08-18 DIAGNOSIS — Z23 Encounter for immunization: Secondary | ICD-10-CM | POA: Diagnosis not present

## 2014-08-18 DIAGNOSIS — R972 Elevated prostate specific antigen [PSA]: Secondary | ICD-10-CM | POA: Diagnosis not present

## 2014-08-18 DIAGNOSIS — E119 Type 2 diabetes mellitus without complications: Secondary | ICD-10-CM | POA: Diagnosis not present

## 2014-08-18 DIAGNOSIS — I10 Essential (primary) hypertension: Secondary | ICD-10-CM | POA: Diagnosis not present

## 2014-08-18 DIAGNOSIS — R569 Unspecified convulsions: Secondary | ICD-10-CM | POA: Diagnosis not present

## 2014-08-18 DIAGNOSIS — E785 Hyperlipidemia, unspecified: Secondary | ICD-10-CM | POA: Diagnosis not present

## 2014-10-22 ENCOUNTER — Encounter (HOSPITAL_COMMUNITY): Payer: Self-pay | Admitting: Cardiovascular Disease

## 2014-12-12 ENCOUNTER — Encounter: Payer: Self-pay | Admitting: Internal Medicine

## 2015-02-08 ENCOUNTER — Encounter: Payer: Self-pay | Admitting: Podiatry

## 2015-02-08 ENCOUNTER — Ambulatory Visit (INDEPENDENT_AMBULATORY_CARE_PROVIDER_SITE_OTHER): Payer: Medicare Other | Admitting: Podiatry

## 2015-02-08 DIAGNOSIS — B351 Tinea unguium: Secondary | ICD-10-CM | POA: Diagnosis not present

## 2015-02-08 DIAGNOSIS — M79676 Pain in unspecified toe(s): Secondary | ICD-10-CM | POA: Diagnosis not present

## 2015-02-08 NOTE — Progress Notes (Signed)
Patient ID: Arthur BurySamuel W Geyer, male   DOB: [birth year], 69 y.o.   MRN: [id number]  Subjective: Patient presents today complaining of painful toenails and requests nail debridement  Objective: The toenails are elongated, hypertrophic, incurvated, discolored and tender to direct palpation 6-10  Assessment: Diabetic Symptomatic onychomycoses 6-10  Plan: Debridement toenails 10 without any bleeding  Reappoint 3 months

## 2015-02-08 NOTE — Patient Instructions (Signed)
Diabetes and Foot Care Diabetes may cause you to have problems because of poor blood supply (circulation) to your feet and legs. This may cause the skin on your feet to become thinner, break easier, and heal more slowly. Your skin may become dry, and the skin may peel and crack. You may also have nerve damage in your legs and feet causing decreased feeling in them. You may not notice minor injuries to your feet that could lead to infections or more serious problems. Taking care of your feet is one of the most important things you can do for yourself.  HOME CARE INSTRUCTIONS  Wear shoes at all times, even in the house. Do not go barefoot. Bare feet are easily injured.  Check your feet daily for blisters, cuts, and redness. If you cannot see the bottom of your feet, use a mirror or ask someone for help.  Wash your feet with warm water (do not use hot water) and mild soap. Then pat your feet and the areas between your toes until they are completely dry. Do not soak your feet as this can dry your skin.  Apply a moisturizing lotion or petroleum jelly (that does not contain alcohol and is unscented) to the skin on your feet and to dry, brittle toenails. Do not apply lotion between your toes.  Trim your toenails straight across. Do not dig under them or around the cuticle. File the edges of your nails with an emery board or nail file.  Do not cut corns or calluses or try to remove them with medicine.  Wear clean socks or stockings every day. Make sure they are not too tight. Do not wear knee-high stockings since they may decrease blood flow to your legs.  Wear shoes that fit properly and have enough cushioning. To break in new shoes, wear them for just a few hours a day. This prevents you from injuring your feet. Always look in your shoes before you put them on to be sure there are no objects inside.  Do not cross your legs. This may decrease the blood flow to your feet.  If you find a minor scrape,  cut, or break in the skin on your feet, keep it and the skin around it clean and dry. These areas may be cleansed with mild soap and water. Do not cleanse the area with peroxide, alcohol, or iodine.  When you remove an adhesive bandage, be sure not to damage the skin around it.  If you have a wound, look at it several times a day to make sure it is healing.  Do not use heating pads or hot water bottles. They may burn your skin. If you have lost feeling in your feet or legs, you may not know it is happening until it is too late.  Make sure your health care provider performs a complete foot exam at least annually or more often if you have foot problems. Report any cuts, sores, or bruises to your health care provider immediately. SEEK MEDICAL CARE IF:   You have an injury that is not healing.  You have cuts or breaks in the skin.  You have an ingrown nail.  You notice redness on your legs or feet.  You feel burning or tingling in your legs or feet.  You have pain or cramps in your legs and feet.  Your legs or feet are numb.  Your feet always feel cold. SEEK IMMEDIATE MEDICAL CARE IF:   There is increasing redness,   swelling, or pain in or around a wound.  There is a red line that goes up your leg.  Pus is coming from a wound.  You develop a fever or as directed by your health care provider.  You notice a bad smell coming from an ulcer or wound. Document Released: 10/27/2000 Document Revised: 07/02/2013 Document Reviewed: 04/08/2013 [employer] Patient Information 2015 [employer]. This information is not intended to replace advice given to you by your health care provider. Make sure you discuss any questions you have with your health care provider.  

## 2015-02-17 DIAGNOSIS — E119 Type 2 diabetes mellitus without complications: Secondary | ICD-10-CM | POA: Diagnosis not present

## 2015-02-17 DIAGNOSIS — R569 Unspecified convulsions: Secondary | ICD-10-CM | POA: Diagnosis not present

## 2015-02-17 DIAGNOSIS — E785 Hyperlipidemia, unspecified: Secondary | ICD-10-CM | POA: Diagnosis not present

## 2015-02-17 DIAGNOSIS — I10 Essential (primary) hypertension: Secondary | ICD-10-CM | POA: Diagnosis not present

## 2015-02-17 DIAGNOSIS — Z125 Encounter for screening for malignant neoplasm of prostate: Secondary | ICD-10-CM | POA: Diagnosis not present

## 2015-02-23 ENCOUNTER — Encounter: Payer: Self-pay | Admitting: Internal Medicine

## 2015-02-23 DIAGNOSIS — M5380 Other specified dorsopathies, site unspecified: Secondary | ICD-10-CM | POA: Diagnosis not present

## 2015-02-23 DIAGNOSIS — R310 Gross hematuria: Secondary | ICD-10-CM | POA: Diagnosis not present

## 2015-02-23 DIAGNOSIS — G4733 Obstructive sleep apnea (adult) (pediatric): Secondary | ICD-10-CM | POA: Diagnosis not present

## 2015-02-23 DIAGNOSIS — Z6828 Body mass index (BMI) 28.0-28.9, adult: Secondary | ICD-10-CM | POA: Diagnosis not present

## 2015-02-23 DIAGNOSIS — R972 Elevated prostate specific antigen [PSA]: Secondary | ICD-10-CM | POA: Diagnosis not present

## 2015-02-23 DIAGNOSIS — Z0000 Encounter for general adult medical examination without abnormal findings: Secondary | ICD-10-CM | POA: Diagnosis not present

## 2015-02-23 DIAGNOSIS — Z1389 Encounter for screening for other disorder: Secondary | ICD-10-CM | POA: Diagnosis not present

## 2015-02-23 DIAGNOSIS — H9190 Unspecified hearing loss, unspecified ear: Secondary | ICD-10-CM | POA: Diagnosis not present

## 2015-02-23 DIAGNOSIS — K219 Gastro-esophageal reflux disease without esophagitis: Secondary | ICD-10-CM | POA: Diagnosis not present

## 2015-02-23 DIAGNOSIS — K432 Incisional hernia without obstruction or gangrene: Secondary | ICD-10-CM | POA: Diagnosis not present

## 2015-02-23 DIAGNOSIS — N200 Calculus of kidney: Secondary | ICD-10-CM | POA: Diagnosis not present

## 2015-02-23 DIAGNOSIS — E669 Obesity, unspecified: Secondary | ICD-10-CM | POA: Diagnosis not present

## 2015-03-18 ENCOUNTER — Encounter: Payer: Self-pay | Admitting: Internal Medicine

## 2015-04-20 ENCOUNTER — Encounter: Payer: Self-pay | Admitting: Internal Medicine

## 2015-04-23 ENCOUNTER — Encounter: Payer: BLUE CROSS/BLUE SHIELD | Admitting: Internal Medicine

## 2015-04-28 DIAGNOSIS — Z111 Encounter for screening for respiratory tuberculosis: Secondary | ICD-10-CM | POA: Diagnosis not present

## 2015-05-05 ENCOUNTER — Ambulatory Visit: Payer: Medicare Other | Admitting: Podiatry

## 2015-06-14 ENCOUNTER — Emergency Department (HOSPITAL_COMMUNITY)
Admission: EM | Admit: 2015-06-14 | Discharge: 2015-06-14 | Disposition: A | Discharge status: Home / Self Care | Payer: Medicare Other | Source: Home / Self Care | Attending: Emergency Medicine | Admitting: Emergency Medicine

## 2015-06-14 ENCOUNTER — Encounter (HOSPITAL_COMMUNITY): Payer: Self-pay | Admitting: Emergency Medicine

## 2015-06-14 ENCOUNTER — Emergency Department (HOSPITAL_COMMUNITY): Payer: Medicare Other

## 2015-06-14 DIAGNOSIS — R270 Ataxia, unspecified: Secondary | ICD-10-CM | POA: Diagnosis present

## 2015-06-14 DIAGNOSIS — S3992XA Unspecified injury of lower back, initial encounter: Secondary | ICD-10-CM | POA: Diagnosis not present

## 2015-06-14 DIAGNOSIS — R55 Syncope and collapse: Secondary | ICD-10-CM

## 2015-06-14 DIAGNOSIS — E042 Nontoxic multinodular goiter: Secondary | ICD-10-CM | POA: Diagnosis present

## 2015-06-14 DIAGNOSIS — Z86010 Personal history of colonic polyps: Secondary | ICD-10-CM

## 2015-06-14 DIAGNOSIS — Z8249 Family history of ischemic heart disease and other diseases of the circulatory system: Secondary | ICD-10-CM

## 2015-06-14 DIAGNOSIS — N189 Chronic kidney disease, unspecified: Secondary | ICD-10-CM

## 2015-06-14 DIAGNOSIS — S0003XA Contusion of scalp, initial encounter: Secondary | ICD-10-CM | POA: Diagnosis not present

## 2015-06-14 DIAGNOSIS — D696 Thrombocytopenia, unspecified: Secondary | ICD-10-CM | POA: Diagnosis present

## 2015-06-14 DIAGNOSIS — Z820 Family history of epilepsy and other diseases of the nervous system: Secondary | ICD-10-CM

## 2015-06-14 DIAGNOSIS — S199XXA Unspecified injury of neck, initial encounter: Secondary | ICD-10-CM | POA: Diagnosis not present

## 2015-06-14 DIAGNOSIS — E119 Type 2 diabetes mellitus without complications: Secondary | ICD-10-CM | POA: Insufficient documentation

## 2015-06-14 DIAGNOSIS — Z9861 Coronary angioplasty status: Secondary | ICD-10-CM | POA: Insufficient documentation

## 2015-06-14 DIAGNOSIS — R319 Hematuria, unspecified: Secondary | ICD-10-CM

## 2015-06-14 DIAGNOSIS — G4733 Obstructive sleep apnea (adult) (pediatric): Secondary | ICD-10-CM | POA: Diagnosis present

## 2015-06-14 DIAGNOSIS — D72829 Elevated white blood cell count, unspecified: Secondary | ICD-10-CM | POA: Diagnosis present

## 2015-06-14 DIAGNOSIS — Z87891 Personal history of nicotine dependence: Secondary | ICD-10-CM

## 2015-06-14 DIAGNOSIS — S0990XA Unspecified injury of head, initial encounter: Secondary | ICD-10-CM | POA: Diagnosis not present

## 2015-06-14 DIAGNOSIS — G40909 Epilepsy, unspecified, not intractable, without status epilepticus: Secondary | ICD-10-CM

## 2015-06-14 DIAGNOSIS — I63539 Cerebral infarction due to unspecified occlusion or stenosis of unspecified posterior cerebral artery: Secondary | ICD-10-CM | POA: Diagnosis not present

## 2015-06-14 DIAGNOSIS — I639 Cerebral infarction, unspecified: Secondary | ICD-10-CM

## 2015-06-14 DIAGNOSIS — I2510 Atherosclerotic heart disease of native coronary artery without angina pectoris: Secondary | ICD-10-CM | POA: Diagnosis present

## 2015-06-14 DIAGNOSIS — Z79899 Other long term (current) drug therapy: Secondary | ICD-10-CM

## 2015-06-14 DIAGNOSIS — Z7982 Long term (current) use of aspirin: Secondary | ICD-10-CM | POA: Insufficient documentation

## 2015-06-14 DIAGNOSIS — E785 Hyperlipidemia, unspecified: Secondary | ICD-10-CM | POA: Insufficient documentation

## 2015-06-14 DIAGNOSIS — K219 Gastro-esophageal reflux disease without esophagitis: Secondary | ICD-10-CM | POA: Diagnosis present

## 2015-06-14 DIAGNOSIS — R296 Repeated falls: Secondary | ICD-10-CM | POA: Diagnosis present

## 2015-06-14 DIAGNOSIS — T420X5A Adverse effect of hydantoin derivatives, initial encounter: Secondary | ICD-10-CM | POA: Diagnosis present

## 2015-06-14 DIAGNOSIS — E1122 Type 2 diabetes mellitus with diabetic chronic kidney disease: Secondary | ICD-10-CM | POA: Diagnosis not present

## 2015-06-14 DIAGNOSIS — Z833 Family history of diabetes mellitus: Secondary | ICD-10-CM

## 2015-06-14 DIAGNOSIS — I129 Hypertensive chronic kidney disease with stage 1 through stage 4 chronic kidney disease, or unspecified chronic kidney disease: Secondary | ICD-10-CM

## 2015-06-14 DIAGNOSIS — Z803 Family history of malignant neoplasm of breast: Secondary | ICD-10-CM

## 2015-06-14 DIAGNOSIS — M1990 Unspecified osteoarthritis, unspecified site: Secondary | ICD-10-CM | POA: Insufficient documentation

## 2015-06-14 DIAGNOSIS — I638 Other cerebral infarction: Secondary | ICD-10-CM | POA: Diagnosis not present

## 2015-06-14 DIAGNOSIS — W050XXA Fall from non-moving wheelchair, initial encounter: Secondary | ICD-10-CM | POA: Diagnosis present

## 2015-06-14 DIAGNOSIS — T420X1A Poisoning by hydantoin derivatives, accidental (unintentional), initial encounter: Secondary | ICD-10-CM | POA: Diagnosis not present

## 2015-06-14 DIAGNOSIS — R42 Dizziness and giddiness: Secondary | ICD-10-CM | POA: Diagnosis present

## 2015-06-14 DIAGNOSIS — R404 Transient alteration of awareness: Secondary | ICD-10-CM | POA: Diagnosis not present

## 2015-06-14 DIAGNOSIS — M545 Low back pain: Secondary | ICD-10-CM | POA: Diagnosis not present

## 2015-06-14 DIAGNOSIS — H53462 Homonymous bilateral field defects, left side: Secondary | ICD-10-CM | POA: Diagnosis present

## 2015-06-14 DIAGNOSIS — N400 Enlarged prostate without lower urinary tract symptoms: Secondary | ICD-10-CM | POA: Diagnosis present

## 2015-06-14 DIAGNOSIS — H539 Unspecified visual disturbance: Secondary | ICD-10-CM

## 2015-06-14 HISTORY — DX: Cerebral infarction, unspecified: I63.9

## 2015-06-14 HISTORY — DX: Unspecified visual disturbance: H53.9

## 2015-06-14 LAB — CBC WITH DIFFERENTIAL/PLATELET
BASOS PCT: 1 % (ref 0–1)
Basophils Absolute: 0.1 10*3/uL (ref 0.0–0.1)
EOS ABS: 0.6 10*3/uL (ref 0.0–0.7)
EOS PCT: 5 % (ref 0–5)
HCT: 36.8 % — ABNORMAL LOW (ref 39.0–52.0)
Hemoglobin: 12.5 g/dL — ABNORMAL LOW (ref 13.0–17.0)
LYMPHS ABS: 2.2 10*3/uL (ref 0.7–4.0)
Lymphocytes Relative: 21 % (ref 12–46)
MCH: 30.3 pg (ref 26.0–34.0)
MCHC: 34 g/dL (ref 30.0–36.0)
MCV: 89.3 fL (ref 78.0–100.0)
Monocytes Absolute: 1 10*3/uL (ref 0.1–1.0)
Monocytes Relative: 9 % (ref 3–12)
Neutro Abs: 6.9 10*3/uL (ref 1.7–7.7)
Neutrophils Relative %: 64 % (ref 43–77)
Platelets: 147 10*3/uL — ABNORMAL LOW (ref 150–400)
RBC: 4.12 MIL/uL — AB (ref 4.22–5.81)
RDW: 12.8 % (ref 11.5–15.5)
WBC: 10.7 10*3/uL — ABNORMAL HIGH (ref 4.0–10.5)

## 2015-06-14 LAB — BASIC METABOLIC PANEL
Anion gap: 8 (ref 5–15)
BUN: 24 mg/dL — ABNORMAL HIGH (ref 6–20)
CHLORIDE: 105 mmol/L (ref 101–111)
CO2: 28 mmol/L (ref 22–32)
CREATININE: 1.18 mg/dL (ref 0.61–1.24)
Calcium: 8.9 mg/dL (ref 8.9–10.3)
GFR calc Af Amer: >60 mL/min (ref >60)
GFR calc non Af Amer: >60 mL/min (ref >60)
GLUCOSE: 85 mg/dL (ref 65–99)
POTASSIUM: 4.3 mmol/L (ref 3.5–5.1)
SODIUM: 141 mmol/L (ref 135–145)

## 2015-06-14 LAB — URINALYSIS, ROUTINE W REFLEX MICROSCOPIC
Bilirubin Urine: NEGATIVE
Glucose, UA: NEGATIVE mg/dL
KETONES UR: NEGATIVE mg/dL
Leukocytes, UA: NEGATIVE
NITRITE: NEGATIVE
PROTEIN: NEGATIVE mg/dL
SPECIFIC GRAVITY, URINE: 1.01 (ref 1.005–1.030)
Urobilinogen, UA: 0.2 mg/dL (ref 0.0–1.0)
pH: 5.5 (ref 5.0–8.0)

## 2015-06-14 LAB — I-STAT TROPONIN, ED: TROPONIN I, POC: 0 ng/mL (ref 0.00–0.08)

## 2015-06-14 LAB — URINE MICROSCOPIC-ADD ON

## 2015-06-14 MED ORDER — ZIPRASIDONE MESYLATE 20 MG IM SOLR
INTRAMUSCULAR | Status: AC
  Filled 2015-06-14: qty 20

## 2015-06-14 MED ORDER — SODIUM CHLORIDE 0.9 % IV BOLUS (SEPSIS)
1000.0000 mL | Freq: Once | INTRAVENOUS | Status: AC
  Administered 2015-06-14: 1000 mL via INTRAVENOUS

## 2015-06-14 NOTE — Discharge Instructions (Signed)

## 2015-06-14 NOTE — ED Provider Notes (Signed)
CSN: [id number]     Arrival date & time 06/14/15  1451 History   First MD Initiated Contact with Patient 06/14/15 1504     Chief Complaint  Patient presents with  . Near Syncope     (Consider location/radiation/quality/duration/timing/severity/associated sxs/prior Treatment) Patient is a 69 y.o. male presenting with syncope. The history is provided by the patient.  Loss of Consciousness Episode history:  Multiple Most recent episode:  Today Duration:  2 hours Timing:  Constant Progression:  Unchanged Chronicity:  New Witnessed: yes   Relieved by:  Nothing Worsened by:  Nothing tried Ineffective treatments:  None tried Associated symptoms: confusion   Associated symptoms: no chest pain, no fever, no headaches, no palpitations, no shortness of breath and no vomiting     69 yo M with a chief complaint of near syncope. Patient does not remember the event. Per the facility patient has had recurrent episodes of this so that he fell down today they deny head injury loss of consciousness. Patient denies any current chest pain headaches neck pain. Denies fevers cough chills dark stools or blood in stool.   Past Medical History  Diagnosis Date  . Colon polyp 2005    Tubular Adenoma  . Hypertension 1999  . Arthritis   . Hyperlipidemia 1999  . Epilepsy   . Sleep apnea   . Chronic kidney disease   . Coronary artery disease, non-occlusive     Minimal nonobstructive CAD, nl LV systolic fxn by cath 12/11/12  . Goiter, nontoxic, multinodular     Thyroid US 11/2012  . Diabetes mellitus 2001    TYPE 2  . Neuromuscular disorder   . Splinter 04/27/13    metal plinter removed rt pointer finger  . GERD (gastroesophageal reflux disease) 04/23/2014  . Diverticulosis   . Internal hemorrhoids    Past Surgical History  Procedure Laterality Date  . Knee surgery  2008    Left  . Cholecystectomy    . Inguinal hernia repair  06/27/11    left  . Esophagogastroduodenoscopy  10/14/2012    normal    . Colonoscopy  12/28/2009  . Left heart catheterization with coronary angiogram N/A 12/11/2012    Procedure: LEFT HEART CATHETERIZATION WITH CORONARY ANGIOGRAM;  Surgeon: Tonny Bollman, MD;  Location: [facility] CATH LAB;  Service: Cardiovascular;  Laterality: N/A;   Family History  Problem Relation Age of Onset  . Breast cancer Sister   . Colon cancer      unknown  . Diabetes Sister   . Heart disease Mother   . Alzheimer's disease Father    History  Substance Use Topics  . Smoking status: Former Smoker    Types: Cigars    Quit date: 04/14/1986  . Smokeless tobacco: Never Used     Comment: Quit cigars in the 1980s  . Alcohol Use: No    Review of Systems  Constitutional: Negative for fever and chills.  HENT: Negative for congestion and facial swelling.   Eyes: Negative for discharge and visual disturbance.  Respiratory: Negative for shortness of breath.   Cardiovascular: Positive for syncope. Negative for chest pain and palpitations.  Gastrointestinal: Negative for vomiting, abdominal pain and diarrhea.  Musculoskeletal: Negative for myalgias and arthralgias.  Skin: Negative for color change and rash.  Neurological: Negative for tremors, syncope and headaches.  Psychiatric/Behavioral: Positive for confusion. Negative for dysphoric mood.      Allergies  Review of patient's allergies indicates no known allergies.  Home Medications   Prior to Admission  medications   Medication Sig Start Date End Date Taking? Authorizing Provider  amLODipine (NORVASC) 5 MG tablet Take 5 mg by mouth every morning.    Yes Historical Provider, MD  aspirin EC 81 MG tablet Take 81 mg by mouth daily.   Yes Historical Provider, MD  atorvastatin (LIPITOR) 40 MG tablet Take 40 mg by mouth daily.   Yes Historical Provider, MD  carvedilol (COREG) 6.25 MG tablet Take 6.25 mg by mouth 2 (two) times daily with a meal.   Yes Historical Provider, MD  escitalopram (LEXAPRO) 5 MG tablet Take 5 mg by mouth daily.    Yes Historical Provider, MD  glyBURIDE-metformin (GLUCOVANCE) 2.5-500 MG per tablet Take 1 tablet by mouth 2 (two) times daily with a meal.   Yes Historical Provider, MD  levETIRAcetam (KEPPRA) 500 MG tablet Take 500 mg by mouth 2 (two) times daily.     Yes Historical Provider, MD  lisinopril (PRINIVIL,ZESTRIL) 40 MG tablet Take 40 mg by mouth daily.   Yes Historical Provider, MD  pantoprazole (PROTONIX) 40 MG tablet Take 1 tablet (40 mg total) by mouth 2 (two) times daily. Patient taking differently: Take 40 mg by mouth daily.  04/23/14  Yes Jessica D Zehr, PA-C  phenytoin (DILANTIN) 100 MG ER capsule Take 400 mg by mouth daily.    Yes Historical Provider, MD  Polyethyl Glycol-Propyl Glycol (SYSTANE ULTRA OP) Place 1 drop into both eyes daily.   Yes Historical Provider, MD  potassium chloride SA (K-DUR,KLOR-CON) 20 MEQ tablet Take 20 mEq by mouth daily.    Yes Historical Provider, MD  risperiDONE (RISPERDAL) 1 MG tablet Take 1 mg by mouth at bedtime.   Yes Historical Provider, MD  tamsulosin (FLOMAX) 0.4 MG CAPS capsule Take 0.4 mg by mouth daily after supper.   Yes Historical Provider, MD   BP 143/79 mmHg  Pulse 72  Temp(Src) 98.2 F (36.8 C) (Oral)  Resp 19  SpO2 100% Physical Exam  Constitutional: He is oriented to person, place, and time. He appears well-developed and well-nourished.  HENT:  Head: Normocephalic and atraumatic.  Eyes: EOM are normal. Pupils are equal, round, and reactive to light.  Neck: Normal range of motion. Neck supple. No JVD present.  Cardiovascular: Normal rate and regular rhythm.  Exam reveals no gallop and no friction rub.   No murmur heard. Pulmonary/Chest: No respiratory distress. He has no wheezes.  Abdominal: He exhibits no distension. There is no rebound and no guarding.  Musculoskeletal: Normal range of motion.  Neurological: He is alert and oriented to person, place, and time. He has normal strength. No cranial nerve deficit or sensory deficit. GCS eye  subscore is 4. GCS verbal subscore is 4. GCS motor subscore is 6. He displays no Babinski's sign on the right side. He displays no Babinski's sign on the left side.  Skin: No rash noted. No pallor.  Psychiatric: He has a normal mood and affect. His behavior is normal.    ED Course  Procedures (including critical care time) Labs Review Labs Reviewed  CBC WITH DIFFERENTIAL/PLATELET - Abnormal; Notable for the following:    WBC 10.7 (*)    RBC 4.12 (*)    Hemoglobin 12.5 (*)    HCT 36.8 (*)    Platelets 147 (*)    All other components within normal limits  BASIC METABOLIC PANEL - Abnormal; Notable for the following:    BUN 24 (*)    All other components within normal limits  URINALYSIS, ROUTINE W  REFLEX MICROSCOPIC (NOT AT [facility]) - Abnormal; Notable for the following:    APPearance CLOUDY (*)    Hgb urine dipstick MODERATE (*)    All other components within normal limits  URINE MICROSCOPIC-ADD ON  I-STAT TROPOININ, ED    Imaging Review Ct Head Wo Contrast  06/14/2015   CLINICAL DATA:  [facility], patient denies loss of consciousness  EXAM: CT HEAD WITHOUT CONTRAST  TECHNIQUE: Contiguous axial images were obtained from the base of the skull through the vertex without intravenous contrast.  COMPARISON:  MRI performed 07/04/2012  FINDINGS: Again identified is agenesis of the corpus callosum with colpocephaly of the ventricles. There is a large dorsal interhemispheric cyst projecting to the left of midline. These findings are stable. There is cerebellar atrophy which is unchanged. There is mild low attenuation in the periventricular white matter, similar to prior study. No evidence of hemorrhage or extra-axial fluid. No skull fracture.  IMPRESSION: Stable chronic ventricular dilatation and colpocephaly, with chronic age-related involutional change and no acute findings.   Electronically Signed   By: Esperanza Heir M.D.   On: 06/14/2015 16:37      EKG Interpretation   Date/Time:  Monday June 14 2015 15:08:16 EDT Ventricular Rate:  72 PR Interval:  148 QRS Duration: 91 QT Interval:  413 QTC Calculation: 452 R Axis:   63 Text Interpretation:  Sinus rhythm no prolonged qt, wpw, brugada No  significant change was found Confirmed by Avyan Livesay MD, DANIEL [phone]) on  06/14/2015 3:42:07 PM      MDM   Final diagnoses:  Syncope and collapse  Hematuria    69 yo M with a chief complaint of frequent falls. Larey Seat today at nursing home voiding of sudden dizziness possible syncopal event. Patient unsure of events. Benign neuro exam. We'll check urine CT head EKG i-STAT Chem-8.    CT head negative UA negative for infection troponin negative EKG unremarkable we'll discharge the patient home.   11:19 PM:  I have discussed the diagnosis/risks/treatment options with the patient and family and believe the pt to be eligible for discharge home to follow-up with PCP. We also discussed returning to the ED immediately if new or worsening sx occur. We discussed the sx which are most concerning (e.g., repeat episode) that necessitate immediate return. Medications administered to the patient during their visit and any new prescriptions provided to the patient are listed below.  Medications given during this visit Medications  sodium chloride 0.9 % bolus 1,000 mL (0 mLs Intravenous Stopped 06/14/15 1720)    Discharge Medication List as of 06/14/2015  5:02 PM       The patient appears reasonably screen and/or stabilized for discharge and I doubt any other medical condition or other [facility] requiring further screening, evaluation, or treatment in the ED at this time prior to discharge.     Melene Plan, DO 06/14/15 2320

## 2015-06-14 NOTE — ED Notes (Signed)
Pt arrived via EMS from [facility] with report of pt having near syncope episode with fall. Facility denies pt hitting head, LOC, dizziness/lightheadedness, headache, or visual disturbances. Facility reported that pt has the near syncope episodes often and requesting to find out the etiology.

## 2015-06-14 NOTE — Progress Notes (Signed)
Pt has male visitor who has [facility] ED staqff Debra to call to verify pt was at [facility] . Cm spoke with pt and he verifies that male family able to visit him at [facility]

## 2015-06-15 ENCOUNTER — Emergency Department (HOSPITAL_COMMUNITY): Payer: Medicare Other

## 2015-06-15 ENCOUNTER — Inpatient Hospital Stay (HOSPITAL_COMMUNITY)
Admission: EM | Admit: 2015-06-15 | Discharge: 2015-06-22 | DRG: 066 | Disposition: A | Discharge status: Home Health | Payer: Medicare Other | Attending: Internal Medicine | Admitting: Internal Medicine

## 2015-06-15 ENCOUNTER — Encounter (HOSPITAL_COMMUNITY): Payer: Self-pay | Admitting: Emergency Medicine

## 2015-06-15 DIAGNOSIS — Z8249 Family history of ischemic heart disease and other diseases of the circulatory system: Secondary | ICD-10-CM | POA: Diagnosis not present

## 2015-06-15 DIAGNOSIS — S3992XA Unspecified injury of lower back, initial encounter: Secondary | ICD-10-CM | POA: Diagnosis not present

## 2015-06-15 DIAGNOSIS — I10 Essential (primary) hypertension: Secondary | ICD-10-CM | POA: Diagnosis not present

## 2015-06-15 DIAGNOSIS — G40409 Other generalized epilepsy and epileptic syndromes, not intractable, without status epilepticus: Secondary | ICD-10-CM

## 2015-06-15 DIAGNOSIS — G40909 Epilepsy, unspecified, not intractable, without status epilepticus: Secondary | ICD-10-CM

## 2015-06-15 DIAGNOSIS — Z79899 Other long term (current) drug therapy: Secondary | ICD-10-CM | POA: Diagnosis not present

## 2015-06-15 DIAGNOSIS — I638 Other cerebral infarction: Secondary | ICD-10-CM | POA: Diagnosis not present

## 2015-06-15 DIAGNOSIS — K219 Gastro-esophageal reflux disease without esophagitis: Secondary | ICD-10-CM | POA: Diagnosis present

## 2015-06-15 DIAGNOSIS — S0003XA Contusion of scalp, initial encounter: Secondary | ICD-10-CM

## 2015-06-15 DIAGNOSIS — E1122 Type 2 diabetes mellitus with diabetic chronic kidney disease: Secondary | ICD-10-CM | POA: Diagnosis present

## 2015-06-15 DIAGNOSIS — D696 Thrombocytopenia, unspecified: Secondary | ICD-10-CM | POA: Diagnosis not present

## 2015-06-15 DIAGNOSIS — I639 Cerebral infarction, unspecified: Secondary | ICD-10-CM | POA: Diagnosis not present

## 2015-06-15 DIAGNOSIS — Z803 Family history of malignant neoplasm of breast: Secondary | ICD-10-CM | POA: Diagnosis not present

## 2015-06-15 DIAGNOSIS — N400 Enlarged prostate without lower urinary tract symptoms: Secondary | ICD-10-CM | POA: Diagnosis present

## 2015-06-15 DIAGNOSIS — E042 Nontoxic multinodular goiter: Secondary | ICD-10-CM | POA: Diagnosis present

## 2015-06-15 DIAGNOSIS — I129 Hypertensive chronic kidney disease with stage 1 through stage 4 chronic kidney disease, or unspecified chronic kidney disease: Secondary | ICD-10-CM | POA: Diagnosis present

## 2015-06-15 DIAGNOSIS — Z87891 Personal history of nicotine dependence: Secondary | ICD-10-CM | POA: Diagnosis not present

## 2015-06-15 DIAGNOSIS — E785 Hyperlipidemia, unspecified: Secondary | ICD-10-CM

## 2015-06-15 DIAGNOSIS — E876 Hypokalemia: Secondary | ICD-10-CM | POA: Diagnosis present

## 2015-06-15 DIAGNOSIS — R296 Repeated falls: Secondary | ICD-10-CM | POA: Diagnosis present

## 2015-06-15 DIAGNOSIS — G4733 Obstructive sleep apnea (adult) (pediatric): Secondary | ICD-10-CM | POA: Diagnosis present

## 2015-06-15 DIAGNOSIS — H53462 Homonymous bilateral field defects, left side: Secondary | ICD-10-CM | POA: Diagnosis present

## 2015-06-15 DIAGNOSIS — I63532 Cerebral infarction due to unspecified occlusion or stenosis of left posterior cerebral artery: Secondary | ICD-10-CM | POA: Diagnosis not present

## 2015-06-15 DIAGNOSIS — N189 Chronic kidney disease, unspecified: Secondary | ICD-10-CM | POA: Diagnosis present

## 2015-06-15 DIAGNOSIS — E1159 Type 2 diabetes mellitus with other circulatory complications: Secondary | ICD-10-CM

## 2015-06-15 DIAGNOSIS — W050XXA Fall from non-moving wheelchair, initial encounter: Secondary | ICD-10-CM | POA: Diagnosis present

## 2015-06-15 DIAGNOSIS — T420X1S Poisoning by hydantoin derivatives, accidental (unintentional), sequela: Secondary | ICD-10-CM | POA: Diagnosis not present

## 2015-06-15 DIAGNOSIS — I6621 Occlusion and stenosis of right posterior cerebral artery: Secondary | ICD-10-CM | POA: Diagnosis not present

## 2015-06-15 DIAGNOSIS — T420X5A Adverse effect of hydantoin derivatives, initial encounter: Secondary | ICD-10-CM | POA: Diagnosis present

## 2015-06-15 DIAGNOSIS — R270 Ataxia, unspecified: Secondary | ICD-10-CM | POA: Diagnosis present

## 2015-06-15 DIAGNOSIS — T420X1A Poisoning by hydantoin derivatives, accidental (unintentional), initial encounter: Secondary | ICD-10-CM | POA: Diagnosis not present

## 2015-06-15 DIAGNOSIS — E119 Type 2 diabetes mellitus without complications: Secondary | ICD-10-CM | POA: Diagnosis not present

## 2015-06-15 DIAGNOSIS — I63539 Cerebral infarction due to unspecified occlusion or stenosis of unspecified posterior cerebral artery: Secondary | ICD-10-CM | POA: Diagnosis present

## 2015-06-15 DIAGNOSIS — W19XXXA Unspecified fall, initial encounter: Secondary | ICD-10-CM | POA: Diagnosis present

## 2015-06-15 DIAGNOSIS — Z7982 Long term (current) use of aspirin: Secondary | ICD-10-CM | POA: Diagnosis not present

## 2015-06-15 DIAGNOSIS — I2510 Atherosclerotic heart disease of native coronary artery without angina pectoris: Secondary | ICD-10-CM | POA: Diagnosis present

## 2015-06-15 DIAGNOSIS — S199XXA Unspecified injury of neck, initial encounter: Secondary | ICD-10-CM | POA: Diagnosis not present

## 2015-06-15 DIAGNOSIS — D72829 Elevated white blood cell count, unspecified: Secondary | ICD-10-CM | POA: Diagnosis present

## 2015-06-15 DIAGNOSIS — T420X1D Poisoning by hydantoin derivatives, accidental (unintentional), subsequent encounter: Secondary | ICD-10-CM | POA: Diagnosis not present

## 2015-06-15 DIAGNOSIS — R55 Syncope and collapse: Secondary | ICD-10-CM | POA: Diagnosis not present

## 2015-06-15 DIAGNOSIS — R51 Headache: Secondary | ICD-10-CM | POA: Diagnosis not present

## 2015-06-15 DIAGNOSIS — Z86010 Personal history of colonic polyps: Secondary | ICD-10-CM | POA: Diagnosis not present

## 2015-06-15 DIAGNOSIS — Z820 Family history of epilepsy and other diseases of the nervous system: Secondary | ICD-10-CM | POA: Diagnosis not present

## 2015-06-15 DIAGNOSIS — Z833 Family history of diabetes mellitus: Secondary | ICD-10-CM | POA: Diagnosis not present

## 2015-06-15 DIAGNOSIS — S0990XA Unspecified injury of head, initial encounter: Secondary | ICD-10-CM | POA: Diagnosis not present

## 2015-06-15 DIAGNOSIS — S098XXA Other specified injuries of head, initial encounter: Secondary | ICD-10-CM | POA: Diagnosis not present

## 2015-06-15 DIAGNOSIS — M545 Low back pain: Secondary | ICD-10-CM | POA: Diagnosis not present

## 2015-06-15 DIAGNOSIS — R42 Dizziness and giddiness: Secondary | ICD-10-CM | POA: Diagnosis present

## 2015-06-15 DIAGNOSIS — I6789 Other cerebrovascular disease: Secondary | ICD-10-CM | POA: Diagnosis not present

## 2015-06-15 LAB — BASIC METABOLIC PANEL
Anion gap: 11 (ref 5–15)
BUN: 20 mg/dL (ref 6–20)
CHLORIDE: 101 mmol/L (ref 101–111)
CO2: 29 mmol/L (ref 22–32)
CREATININE: 1.14 mg/dL (ref 0.61–1.24)
Calcium: 9.4 mg/dL (ref 8.9–10.3)
GLUCOSE: 133 mg/dL — AB (ref 65–99)
Potassium: 4.1 mmol/L (ref 3.5–5.1)
Sodium: 141 mmol/L (ref 135–145)

## 2015-06-15 LAB — URINALYSIS, ROUTINE W REFLEX MICROSCOPIC
Bilirubin Urine: NEGATIVE
Glucose, UA: NEGATIVE mg/dL
KETONES UR: NEGATIVE mg/dL
Leukocytes, UA: NEGATIVE
Nitrite: NEGATIVE
Protein, ur: NEGATIVE mg/dL
SPECIFIC GRAVITY, URINE: 1.009 (ref 1.005–1.030)
Urobilinogen, UA: 0.2 mg/dL (ref 0.0–1.0)
pH: 7 (ref 5.0–8.0)

## 2015-06-15 LAB — CBC WITH DIFFERENTIAL/PLATELET
BASOS ABS: 0 10*3/uL (ref 0.0–0.1)
BASOS PCT: 0 % (ref 0–1)
EOS PCT: 4 % (ref 0–5)
Eosinophils Absolute: 0.4 10*3/uL (ref 0.0–0.7)
HEMATOCRIT: 36.5 % — AB (ref 39.0–52.0)
Hemoglobin: 12.8 g/dL — ABNORMAL LOW (ref 13.0–17.0)
Lymphocytes Relative: 21 % (ref 12–46)
Lymphs Abs: 2.3 10*3/uL (ref 0.7–4.0)
MCH: 30.8 pg (ref 26.0–34.0)
MCHC: 35.1 g/dL (ref 30.0–36.0)
MCV: 88 fL (ref 78.0–100.0)
MONOS PCT: 9 % (ref 3–12)
Monocytes Absolute: 1 10*3/uL (ref 0.1–1.0)
NEUTROS PCT: 66 % (ref 43–77)
Neutro Abs: 7.3 10*3/uL (ref 1.7–7.7)
Platelets: 141 10*3/uL — ABNORMAL LOW (ref 150–400)
RBC: 4.15 MIL/uL — ABNORMAL LOW (ref 4.22–5.81)
RDW: 12.8 % (ref 11.5–15.5)
WBC: 11 10*3/uL — ABNORMAL HIGH (ref 4.0–10.5)

## 2015-06-15 LAB — URINE MICROSCOPIC-ADD ON

## 2015-06-15 LAB — TROPONIN I: Troponin I: <0.03 ng/mL (ref <0.031)

## 2015-06-15 NOTE — ED Notes (Signed)
CRITICAL VALUE ALERT  Critical value received:  Dilantin 44.4  Date of notification:  06/15/2015  Time of notification:  2320  Critical value read back:Yes.    Nurse who received alert:  MG Jenelle Mages, RN  MD notified (1st page):    Time of first page:  2320  MD notified (2nd page):  Time of second page:  Responding MD:  Dr. Loretha Stapler  Time MD responded:  2320

## 2015-06-15 NOTE — ED Notes (Signed)
Per [facility], pt from [facility]. Pt was watching TV in wheelchair. Stated that patient tried to get out of W/c and fell to the ground on carpeted floor. Pt has abraision above right eye. HX of dementia. Pt was seen here yesterday for a fall as well. Pt states he normally can walk. Pt arrived in C collar and LSB. No LOC reported. Pt mentation is per baseline from the facility. Pt is alert and asnwering questions, in NAD. Denies neck or back pain.

## 2015-06-15 NOTE — ED Notes (Signed)
Pt urinated on himself, sheets changed and patient cleaned up.

## 2015-06-15 NOTE — ED Notes (Signed)
Per Dr. Loretha Stapler, C collar can be removed.

## 2015-06-15 NOTE — ED Notes (Signed)
Family at bedside. 

## 2015-06-15 NOTE — ED Provider Notes (Signed)
CSN: [id number]     Arrival date & time 06/15/15  1926 History   First MD Initiated Contact with Patient 06/15/15 2003     Chief Complaint  Patient presents with  . Fall     (Consider location/radiation/quality/duration/timing/severity/associated sxs/prior Treatment) Patient is a 69 y.o. male presenting with fall and altered mental status.  Fall This is a recurrent problem. Episode onset: today. Episode frequency: once. The problem has been resolved. Pertinent negatives include no chest pain, no abdominal pain and no shortness of breath. Associated symptoms comments: Head contusion. .  Altered Mental Status Presenting symptoms: confusion   Severity:  Severe Most recent episode: 1 week. Episode history:  Continuous Timing:  Constant Progression:  Worsening (significantly worse today) Associated symptoms: no abdominal pain, no fever, no nausea and no vomiting   Associated symptoms comment:  Decreased memory.  dizziness   Past Medical History  Diagnosis Date  . Colon polyp 2005    Tubular Adenoma  . Hypertension 1999  . Arthritis   . Hyperlipidemia 1999  . Epilepsy   . Sleep apnea   . Chronic kidney disease   . Coronary artery disease, non-occlusive     Minimal nonobstructive CAD, nl LV systolic fxn by cath 12/11/12  . Goiter, nontoxic, multinodular     Thyroid US 11/2012  . Diabetes mellitus 2001    TYPE 2  . Neuromuscular disorder   . Splinter 04/27/13    metal plinter removed rt pointer finger  . GERD (gastroesophageal reflux disease) 04/23/2014  . Diverticulosis   . Internal hemorrhoids    Past Surgical History  Procedure Laterality Date  . Knee surgery  2008    Left  . Cholecystectomy    . Inguinal hernia repair  06/27/11    left  . Esophagogastroduodenoscopy  10/14/2012    normal   . Colonoscopy  12/28/2009  . Left heart catheterization with coronary angiogram N/A 12/11/2012    Procedure: LEFT HEART CATHETERIZATION WITH CORONARY ANGIOGRAM;  Surgeon: Tonny Bollman,  MD;  Location: [facility] CATH LAB;  Service: Cardiovascular;  Laterality: N/A;   Family History  Problem Relation Age of Onset  . Breast cancer Sister   . Colon cancer      unknown  . Diabetes Sister   . Heart disease Mother   . Alzheimer's disease Father    History  Substance Use Topics  . Smoking status: Former Smoker    Types: Cigars    Quit date: 04/14/1986  . Smokeless tobacco: Never Used     Comment: Quit cigars in the 1980s  . Alcohol Use: No    Review of Systems  Unable to perform ROS: Mental status change  Constitutional: Negative for fever.  Respiratory: Negative for shortness of breath.   Cardiovascular: Negative for chest pain.  Gastrointestinal: Negative for nausea, vomiting and abdominal pain.  Psychiatric/Behavioral: Positive for confusion.      Allergies  Review of patient's allergies indicates no known allergies.  Home Medications   Prior to Admission medications   Medication Sig Start Date End Date Taking? Authorizing Provider  amLODipine (NORVASC) 5 MG tablet Take 5 mg by mouth every morning.     Historical Provider, MD  aspirin EC 81 MG tablet Take 81 mg by mouth daily.    Historical Provider, MD  atorvastatin (LIPITOR) 40 MG tablet Take 40 mg by mouth daily.    Historical Provider, MD  carvedilol (COREG) 6.25 MG tablet Take 6.25 mg by mouth 2 (two) times daily with a meal.  Historical Provider, MD  escitalopram (LEXAPRO) 5 MG tablet Take 5 mg by mouth daily.    Historical Provider, MD  glyBURIDE-metformin (GLUCOVANCE) 2.5-500 MG per tablet Take 1 tablet by mouth 2 (two) times daily with a meal.    Historical Provider, MD  levETIRAcetam (KEPPRA) 500 MG tablet Take 500 mg by mouth 2 (two) times daily.      Historical Provider, MD  lisinopril (PRINIVIL,ZESTRIL) 40 MG tablet Take 40 mg by mouth daily.    Historical Provider, MD  pantoprazole (PROTONIX) 40 MG tablet Take 1 tablet (40 mg total) by mouth 2 (two) times daily. Patient taking differently: Take 40  mg by mouth daily.  04/23/14   Princella Pellegrini Zehr, PA-C  phenytoin (DILANTIN) 100 MG ER capsule Take 400 mg by mouth daily.     Historical Provider, MD  Polyethyl Glycol-Propyl Glycol (SYSTANE ULTRA OP) Place 1 drop into both eyes daily.    Historical Provider, MD  potassium chloride SA (K-DUR,KLOR-CON) 20 MEQ tablet Take 20 mEq by mouth daily.     Historical Provider, MD  risperiDONE (RISPERDAL) 1 MG tablet Take 1 mg by mouth at bedtime.    Historical Provider, MD  tamsulosin (FLOMAX) 0.4 MG CAPS capsule Take 0.4 mg by mouth daily after supper.    Historical Provider, MD   BP 147/80 mmHg  Pulse 74  Temp(Src) 98.3 F (36.8 C) (Oral)  Resp 12  SpO2 100% Physical Exam  Constitutional: He is oriented to person, place, and time. He appears well-developed and well-nourished. No distress.  HENT:  Head: Normocephalic and atraumatic. Head is without raccoon's eyes and without Battle's sign.    Nose: Nose normal.  Eyes: Conjunctivae and EOM are normal. Pupils are equal, round, and reactive to light. No scleral icterus.  Neck: Spinous process tenderness and muscular tenderness present.  Cardiovascular: Normal rate, regular rhythm, normal heart sounds and intact distal pulses.   No murmur heard. Pulmonary/Chest: Effort normal and breath sounds normal. He has no rales. He exhibits no tenderness.  Abdominal: Soft. There is no tenderness. There is no rebound and no guarding.  Musculoskeletal: Normal range of motion. He exhibits no edema.       Thoracic back: He exhibits no tenderness and no bony tenderness.       Lumbar back: He exhibits tenderness and bony tenderness.  No evidence of trauma to extremities, except as noted.  2+ distal pulses.    Neurological: He is alert and oriented to person, place, and time.  Heel to shin grossly intact. Mild weakness of RUE compared to LUE. Gait not tested.   Skin: Skin is warm and dry. No rash noted.  Psychiatric: He has a normal mood and affect.  Nursing note  and vitals reviewed.   ED Course  Procedures (including critical care time) Labs Review Labs Reviewed  CBC WITH DIFFERENTIAL/PLATELET - Abnormal; Notable for the following:    WBC 11.0 (*)    RBC 4.15 (*)    Hemoglobin 12.8 (*)    HCT 36.5 (*)    Platelets 141 (*)    All other components within normal limits  BASIC METABOLIC PANEL - Abnormal; Notable for the following:    Glucose, Bld 133 (*)    All other components within normal limits  URINALYSIS, ROUTINE W REFLEX MICROSCOPIC (NOT AT [facility]) - Abnormal; Notable for the following:    Hgb urine dipstick TRACE (*)    All other components within normal limits  TROPONIN I  PHENYTOIN LEVEL, TOTAL  URINE MICROSCOPIC-ADD ON  LEVETIRACETAM LEVEL  PHENYTOIN LEVEL, TOTAL  HEMOGLOBIN A1C  LIPID PANEL  PROTIME-INR  APTT    Imaging Review Dg Lumbar Spine Complete  06/15/2015   CLINICAL DATA:  Status post fall, lower back pain. Initial encounter.  EXAM: LUMBAR SPINE - COMPLETE 4+ VIEW  COMPARISON:  Abdominal radiograph performed 06/29/2014  FINDINGS: There is no evidence of fracture or subluxation. Vertebral bodies demonstrate normal height and alignment. Intervertebral disc spaces are preserved. The visualized neural foramina are grossly unremarkable in appearance. Anterior osteophytes are noted along the lower thoracic and lumbar spine.  The visualized bowel gas pattern is unremarkable in appearance; air and stool are noted within the colon. The sacroiliac joints are within normal limits.  IMPRESSION: No evidence of fracture or subluxation along the lumbar spine.   Electronically Signed   By: Roanna Raider M.D.   On: 06/15/2015 21:28   Ct Head Wo Contrast  06/15/2015   ADDENDUM REPORT: 06/15/2015 22:56  ADDENDUM: CT - CERVICAL SPINE WITHOUT CONTRAST:  The vertebral column, pedicles and facet articulations are intact. There is no evidence of acute fracture. No acute soft tissue abnormalities are evident.  There are moderately severe degenerative  disc changes with small posterior osteophytes at multiple levels. The thyroid goiter is again evident. It is incompletely imaged.  IMPRESSION: 1. Negative for acute cervical spine fracture 2. Incompletely imaged thyroid goiter. This was evaluated with ultrasonography on 12/11/2012.   Electronically Signed   By: Ellery Plunk M.D.   On: 06/15/2015 22:56   06/15/2015   CLINICAL DATA:  Larey Seat from wheelchair to carpeted floor. Abrasion above the right eye. Patient was seen yesterday for a different fall.  EXAM: CT HEAD WITHOUT CONTRAST  TECHNIQUE: Contiguous axial images were obtained from the base of the skull through the vertex without intravenous contrast.  COMPARISON:  06/14/2015, 07/04/2012.  FINDINGS: There is no intracranial hemorrhage. There is agenesis of the corpus callosum and a dorsal interhemispheric cyst extending to the left. There is new hypodensity in the medial right occipital lobe which likely represents cytotoxic edema associated with a subacute infarction. No other significant interval changes are evident. There is mild mass effect on the right occipital horn. There is no midline shift.  There is a right frontal scalp hematoma. Calvarium and skullbase are intact.  IMPRESSION: 1. Subacute nonhemorrhagic right occipital infarction. No midline shift. Mild mass effect on the right occipital horn. 2. Congenital agenesis of the corpus callosum with a dorsal interhemispheric cyst extending to the left. 3. Right frontal scalp hematoma. No evidence of acute intracranial traumatic injury.  Electronically Signed: By: Ellery Plunk M.D. On: 06/15/2015 21:21   Ct Head Wo Contrast  06/14/2015   CLINICAL DATA:  [facility], patient denies loss of consciousness  EXAM: CT HEAD WITHOUT CONTRAST  TECHNIQUE: Contiguous axial images were obtained from the base of the skull through the vertex without intravenous contrast.  COMPARISON:  MRI performed 07/04/2012   FINDINGS: Again identified is agenesis of the corpus callosum with colpocephaly of the ventricles. There is a large dorsal interhemispheric cyst projecting to the left of midline. These findings are stable. There is cerebellar atrophy which is unchanged. There is mild low attenuation in the periventricular white matter, similar to prior study. No evidence of hemorrhage or extra-axial fluid. No skull fracture.  IMPRESSION: Stable chronic ventricular dilatation and colpocephaly, with chronic age-related involutional change and no acute findings.   Electronically Signed  By: Esperanza Heir M.D.   On: 06/14/2015 16:37   Ct Cervical Spine Wo Contrast  06/15/2015   ADDENDUM REPORT: 06/15/2015 22:56  ADDENDUM: CT - CERVICAL SPINE WITHOUT CONTRAST:  The vertebral column, pedicles and facet articulations are intact. There is no evidence of acute fracture. No acute soft tissue abnormalities are evident.  There are moderately severe degenerative disc changes with small posterior osteophytes at multiple levels. The thyroid goiter is again evident. It is incompletely imaged.  IMPRESSION: 1. Negative for acute cervical spine fracture 2. Incompletely imaged thyroid goiter. This was evaluated with ultrasonography on 12/11/2012.   Electronically Signed   By: Ellery Plunk M.D.   On: 06/15/2015 22:56   06/15/2015   CLINICAL DATA:  Larey Seat from wheelchair to carpeted floor. Abrasion above the right eye. Patient was seen yesterday for a different fall.  EXAM: CT HEAD WITHOUT CONTRAST  TECHNIQUE: Contiguous axial images were obtained from the base of the skull through the vertex without intravenous contrast.  COMPARISON:  06/14/2015, 07/04/2012.  FINDINGS: There is no intracranial hemorrhage. There is agenesis of the corpus callosum and a dorsal interhemispheric cyst extending to the left. There is new hypodensity in the medial right occipital lobe which likely represents cytotoxic edema associated with a subacute infarction. No  other significant interval changes are evident. There is mild mass effect on the right occipital horn. There is no midline shift.  There is a right frontal scalp hematoma. Calvarium and skullbase are intact.  IMPRESSION: 1. Subacute nonhemorrhagic right occipital infarction. No midline shift. Mild mass effect on the right occipital horn. 2. Congenital agenesis of the corpus callosum with a dorsal interhemispheric cyst extending to the left. 3. Right frontal scalp hematoma. No evidence of acute intracranial traumatic injury.  Electronically Signed: By: Ellery Plunk M.D. On: 06/15/2015 21:21     EKG Interpretation None      MDM   Final diagnoses:  Fall  Cerebral infarction due to unspecified mechanism  Phenytoin toxicity, accidental or unintentional, initial encounter  Scalp hematoma, initial encounter    69 year old male with multiple recent falls who presents after a fall out of his wheelchair tonight. CT of his head demonstrated no intracranial hemorrhage did show subacute occipital infarct. His lab work was also notable for an elevated phenytoin level. This likely explains his recent dizziness and ataxia. I suspect that he has been getting this medication more consistently since entering the nursing facility. Consulted neurology and internal medicine. Dr. Clyde Lundborg will admit.    Blake Divine, MD 06/16/15 [phone]

## 2015-06-15 NOTE — H&P (Signed)
[facility] History and Physical  Arthur Berry ZOX:[id number] DOB: [birth year] DOA: 06/15/2015  Referring physician: ED physician PCP: [name], MD  Specialists:   Chief Complaint: fall and near syncope  HPI: Arthur Berry is a 69 y.o. male with PMH of hypertension, hyperlipidemia, diabetes mellitus, GERD, seizure, CAD, goiter, BPH, who presents with fall and near syncope  Patient is a resident in assisted living facility. The patient reports that he had fall and feeling of almost passed out, not did not yesterday. Patient was evaluated at [facility] hospital and released. Today brother visited him. Patient told brother that he had blurry vision and dizziness.He had fall again when he got up from wheelchair today. He injured his right frontal area. He also states that "I am being used by somebody". No vision or hearing hallucination. No unilateral weakness, numbness or tingling sensations. Patient does not have chest pain, shortness breath, cough, abdominal pain, diarrhea, symptoms for UTI. Patient denies having seizures.  In ED, patient was found to have negative troponin, negative urinalysis, WBC 11.0, temperature normal, no tachycardia, electrolytes okay. CT C-spine is negative for bony fracture. CT-head showed subacute nonhemorrhagic right occipital infarction. No midline shift. Mild mass effect on the right occipital horn. Congenital agenesis of the corpus callosum with a dorsal interhemispheric cyst extending to the left. Right frontal scalp hematoma. No evidence of acute intracranial traumatic injury. Initial dilantin level is >50 per ED and repeated level test is pending. Patient is admitted to inpatient for further evaluation and treatment. A neurology was consulted.  Where does patient live?  Assisted living facility Can patient participate in ADLs? barely Review of Systems:   General: no fevers, chills, no changes in body weight, has fatigue HEENT: no blurry vision, hearing  changes or sore throat Pulm: no dyspnea, coughing, wheezing CV: no chest pain, palpitations Abd: no nausea, vomiting, abdominal pain, diarrhea, constipation GU: no dysuria, burning on urination, increased urinary frequency, hematuria  Ext: no leg edema Neuro: no unilateral weakness, numbness, or tingling, no vision change or hearing loss. Has dizziness and fall. Skin: has bruise over R frontal area. MSK: No muscle spasm, no deformity, no limitation of range of movement in spin Heme: no easy bruising.  Travel history: No recent long distant travel.  Allergy: No Known Allergies  Past Medical History  Diagnosis Date  . Colon polyp 2005    Tubular Adenoma  . Hypertension 1999  . Arthritis   . Hyperlipidemia 1999  . Epilepsy   . Sleep apnea   . Chronic kidney disease   . Coronary artery disease, non-occlusive     Minimal nonobstructive CAD, nl LV systolic fxn by cath 12/11/12  . Goiter, nontoxic, multinodular     Thyroid US 11/2012  . Diabetes mellitus 2001    TYPE 2  . Neuromuscular disorder   . Splinter 04/27/13    metal plinter removed rt pointer finger  . GERD (gastroesophageal reflux disease) 04/23/2014  . Diverticulosis   . Internal hemorrhoids     Past Surgical History  Procedure Laterality Date  . Knee surgery  2008    Left  . Cholecystectomy    . Inguinal hernia repair  06/27/11    left  . Esophagogastroduodenoscopy  10/14/2012    normal   . Colonoscopy  12/28/2009  . Left heart catheterization with coronary angiogram N/A 12/11/2012    Procedure: LEFT HEART CATHETERIZATION WITH CORONARY ANGIOGRAM;  Surgeon: Tonny Bollman, MD;  Location: [facility] CATH LAB;  Service: Cardiovascular;  Laterality:  N/A;    Social History:  reports that he quit smoking about 29 years ago. His smoking use included Cigars. He has never used smokeless tobacco. He reports that he does not drink alcohol or use illicit drugs.  Family History:  Family History  Problem Relation Age of Onset  .  Breast cancer Sister   . Colon cancer      unknown  . Diabetes Sister   . Heart disease Mother   . Alzheimer's disease Father      Prior to Admission medications   Medication Sig Start Date End Date Taking? Authorizing Provider  amLODipine (NORVASC) 5 MG tablet Take 5 mg by mouth every morning.    Yes Historical Provider, MD  aspirin EC 81 MG tablet Take 81 mg by mouth daily.   Yes Historical Provider, MD  atorvastatin (LIPITOR) 40 MG tablet Take 40 mg by mouth daily.   Yes Historical Provider, MD  carvedilol (COREG) 6.25 MG tablet Take 3.125 mg by mouth 2 (two) times daily with a meal.    Yes Historical Provider, MD  escitalopram (LEXAPRO) 5 MG tablet Take 5 mg by mouth daily.   Yes Historical Provider, MD  glyBURIDE-metformin (GLUCOVANCE) 2.5-500 MG per tablet Take 1 tablet by mouth 2 (two) times daily with a meal.   Yes Historical Provider, MD  levETIRAcetam (KEPPRA) 500 MG tablet Take 500 mg by mouth 2 (two) times daily.     Yes Historical Provider, MD  lisinopril (PRINIVIL,ZESTRIL) 40 MG tablet Take 40 mg by mouth daily.   Yes Historical Provider, MD  pantoprazole (PROTONIX) 40 MG tablet Take 1 tablet (40 mg total) by mouth 2 (two) times daily. Patient taking differently: Take 40 mg by mouth daily.  04/23/14  Yes Jessica D Zehr, PA-C  phenytoin (DILANTIN) 100 MG ER capsule Take 400 mg by mouth daily.    Yes Historical Provider, MD  Polyethyl Glycol-Propyl Glycol (SYSTANE ULTRA OP) Place 1 drop into both eyes daily.   Yes Historical Provider, MD  potassium chloride SA (K-DUR,KLOR-CON) 20 MEQ tablet Take 20 mEq by mouth daily.    Yes Historical Provider, MD  risperiDONE (RISPERDAL) 1 MG tablet Take 1 mg by mouth at bedtime.   Yes Historical Provider, MD  tamsulosin (FLOMAX) 0.4 MG CAPS capsule Take 0.4 mg by mouth daily after supper.   Yes Historical Provider, MD    Physical Exam: Filed Vitals:   06/16/15 0200 06/16/15 0230 06/16/15 0245 06/16/15 0300  BP: 127/62 132/67 121/62 126/63   Pulse: 77 76 73 77  Temp:      TempSrc:      Resp: 14 17 16 16   SpO2: 92% 93% 89% 89%   General: Not in acute distress HEENT: has hematoma over right frontal area.       Eyes: PERRL, EOMI, no scleral icterus.       ENT: No discharge from the ears and nose, no pharynx injection, no tonsillar enlargement.        Neck: No JVD, no bruit, no mass felt. Heme: No neck lymph node enlargement. Cardiac: S1/S2, RRR, No murmurs, No gallops or rubs. Pulm: No rales, wheezing, rhonchi or rubs. Abd: Soft, nondistended, nontender, no rebound pain, no organomegaly, BS present. Ext: No pitting leg edema bilaterally. 2+DP/PT pulse bilaterally. Musculoskeletal: No joint deformities, No joint redness or warmth, no limitation of ROM in spin. Skin: No rashes. has bruise over right frontal area. Neuro: Alert, oriented X3, cranial nerves II-XII grossly intact, muscle strength 5/5 in all  extremities, sensation to light touch intact. Brachial reflex 1+ bilaterally. Knee reflex 1+ bilaterally. Negative Babinski's sign. Normal finger to nose test. Psych: Patient is not psychotic, no suicidal or hemocidal ideation.  Labs on Admission:  Basic Metabolic Panel:  Recent Labs Lab 06/14/15 1622 06/15/15 2130  NA 141 141  K 4.3 4.1  CL 105 101  CO2 28 29  GLUCOSE 85 133*  BUN 24* 20  CREATININE 1.18 1.14  CALCIUM 8.9 9.4   Liver Function Tests: No results for input(s): AST, ALT, ALKPHOS, BILITOT, PROT, ALBUMIN in the last 168 hours. No results for input(s): LIPASE, AMYLASE in the last 168 hours. No results for input(s): AMMONIA in the last 168 hours. CBC:  Recent Labs Lab 06/14/15 1622 06/15/15 2130  WBC 10.7* 11.0*  NEUTROABS 6.9 7.3  HGB 12.5* 12.8*  HCT 36.8* 36.5*  MCV 89.3 88.0  PLT 147* 141*   Cardiac Enzymes:  Recent Labs Lab 06/15/15 2130  TROPONINI <0.03    BNP (last 3 results) No results for input(s): BNP in the last 8760 hours.  ProBNP (last 3 results) No results for  input(s): PROBNP in the last 8760 hours.  CBG: No results for input(s): GLUCAP in the last 168 hours.  Radiological Exams on Admission: Dg Lumbar Spine Complete  06/15/2015   CLINICAL DATA:  Status post fall, lower back pain. Initial encounter.  EXAM: LUMBAR SPINE - COMPLETE 4+ VIEW  COMPARISON:  Abdominal radiograph performed 06/29/2014  FINDINGS: There is no evidence of fracture or subluxation. Vertebral bodies demonstrate normal height and alignment. Intervertebral disc spaces are preserved. The visualized neural foramina are grossly unremarkable in appearance. Anterior osteophytes are noted along the lower thoracic and lumbar spine.  The visualized bowel gas pattern is unremarkable in appearance; air and stool are noted within the colon. The sacroiliac joints are within normal limits.  IMPRESSION: No evidence of fracture or subluxation along the lumbar spine.   Electronically Signed   By: Roanna Raider M.D.   On: 06/15/2015 21:28   Ct Head Wo Contrast  06/15/2015   ADDENDUM REPORT: 06/15/2015 22:56  ADDENDUM: CT - CERVICAL SPINE WITHOUT CONTRAST:  The vertebral column, pedicles and facet articulations are intact. There is no evidence of acute fracture. No acute soft tissue abnormalities are evident.  There are moderately severe degenerative disc changes with small posterior osteophytes at multiple levels. The thyroid goiter is again evident. It is incompletely imaged.  IMPRESSION: 1. Negative for acute cervical spine fracture 2. Incompletely imaged thyroid goiter. This was evaluated with ultrasonography on 12/11/2012.   Electronically Signed   By: Ellery Plunk M.D.   On: 06/15/2015 22:56   06/15/2015   CLINICAL DATA:  Larey Seat from wheelchair to carpeted floor. Abrasion above the right eye. Patient was seen yesterday for a different fall.  EXAM: CT HEAD WITHOUT CONTRAST  TECHNIQUE: Contiguous axial images were obtained from the base of the skull through the vertex without intravenous contrast.   COMPARISON:  06/14/2015, 07/04/2012.  FINDINGS: There is no intracranial hemorrhage. There is agenesis of the corpus callosum and a dorsal interhemispheric cyst extending to the left. There is new hypodensity in the medial right occipital lobe which likely represents cytotoxic edema associated with a subacute infarction. No other significant interval changes are evident. There is mild mass effect on the right occipital horn. There is no midline shift.  There is a right frontal scalp hematoma. Calvarium and skullbase are intact.  IMPRESSION: 1. Subacute nonhemorrhagic right occipital infarction. No midline  shift. Mild mass effect on the right occipital horn. 2. Congenital agenesis of the corpus callosum with a dorsal interhemispheric cyst extending to the left. 3. Right frontal scalp hematoma. No evidence of acute intracranial traumatic injury.  Electronically Signed: By: Ellery Plunk M.D. On: 06/15/2015 21:21   Ct Head Wo Contrast  06/14/2015   CLINICAL DATA:  [facility], [facility] denies loss of consciousness  EXAM: CT HEAD WITHOUT CONTRAST  TECHNIQUE: Contiguous axial images were obtained from the base of the skull through the vertex without intravenous contrast.  COMPARISON:  MRI performed 07/04/2012  FINDINGS: Again identified is agenesis of the corpus callosum with colpocephaly of the ventricles. There is a large dorsal interhemispheric cyst projecting to the left of midline. These findings are stable. There is cerebellar atrophy which is unchanged. There is mild low attenuation in the periventricular white matter, similar to prior study. No evidence of hemorrhage or extra-axial fluid. No skull fracture.  IMPRESSION: Stable chronic ventricular dilatation and colpocephaly, with chronic age-related involutional change and no acute findings.   Electronically Signed   By: Esperanza Heir M.D.   On: 06/14/2015 16:37   Ct Cervical Spine Wo Contrast  06/15/2015    ADDENDUM REPORT: 06/15/2015 22:56  ADDENDUM: CT - CERVICAL SPINE WITHOUT CONTRAST:  The vertebral column, pedicles and facet articulations are intact. There is no evidence of acute fracture. No acute soft tissue abnormalities are evident.  There are moderately severe degenerative disc changes with small posterior osteophytes at multiple levels. The thyroid goiter is again evident. It is incompletely imaged.  IMPRESSION: 1. Negative for acute cervical spine fracture 2. Incompletely imaged thyroid goiter. This was evaluated with ultrasonography on 12/11/2012.   Electronically Signed   By: Ellery Plunk M.D.   On: 06/15/2015 22:56   06/15/2015   CLINICAL DATA:  Larey Seat from wheelchair to carpeted floor. Abrasion above the right eye. Patient was seen yesterday for a different fall.  EXAM: CT HEAD WITHOUT CONTRAST  TECHNIQUE: Contiguous axial images were obtained from the base of the skull through the vertex without intravenous contrast.  COMPARISON:  06/14/2015, 07/04/2012.  FINDINGS: There is no intracranial hemorrhage. There is agenesis of the corpus callosum and a dorsal interhemispheric cyst extending to the left. There is new hypodensity in the medial right occipital lobe which likely represents cytotoxic edema associated with a subacute infarction. No other significant interval changes are evident. There is mild mass effect on the right occipital horn. There is no midline shift.  There is a right frontal scalp hematoma. Calvarium and skullbase are intact.  IMPRESSION: 1. Subacute nonhemorrhagic right occipital infarction. No midline shift. Mild mass effect on the right occipital horn. 2. Congenital agenesis of the corpus callosum with a dorsal interhemispheric cyst extending to the left. 3. Right frontal scalp hematoma. No evidence of acute intracranial traumatic injury.  Electronically Signed: By: Ellery Plunk M.D. On: 06/15/2015 21:21   [name]  06/16/2015   CLINICAL DATA:  Initial  evaluation for acute confusion, altered mental status. Recent fall.  EXAM: MRI HEAD WITHOUT CONTRAST  TECHNIQUE: Multiplanar, multiecho pulse sequences of the brain and surrounding structures were obtained without intravenous contrast.  COMPARISON:  Prior CT from 06/15/2015 as well as previous MRI from 07/04/2012.  FINDINGS: Study is limited as the patient was unable to tolerate the full length of the exam. Only diffusion weighted sequences and sagittal T1 weighted sequence were performed.  Axial diffusion-weighted sequence demonstrates  abnormal restricted diffusion involving the medial right occipital lobe, consistent with acute right PCA territory infarct. No significant mass effect.  Additional tiny 6 mm ischemic infarct within the left occipital lobe (series 3, image 12).  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 No other definite acute abnormality.  IMPRESSION: 1. Limited study with only diffusion-weighted sequences performed. 2. Acute ischemic right PCA territory infarct without significant mass effect. 3. Additional small 6 mm acute ischemic infarct within the left occipital lobe. 4. 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 5. Small right frontal scalp contusion.   Electronically Signed   By: Rise Mu M.D.   On: 06/16/2015 01:47    EKG: Independently reviewed.  No ischemic changes.  Assessment/Plan Principal Problem:   Stroke Active Problems:   Diabetes mellitus without complication   HLD (hyperlipidemia)   Essential hypertension, benign   Potassium (K) deficiency   GERD (gastroesophageal reflux disease)   Fall   Epilepsy   BPH (benign prostatic hyperplasia)   Leukocytosis  Ischemic stroke:  CT-head showed subacute nonhemorrhagic right occipital infarction., with mild mass effect on the right occipital horn, no midline shift. Neurology was  consulted. Dr. Thad Ranger saw patient.  -will admit to SDU. -Appreciate Dr. Thad Ranger recommendations, with follow-up recommendations as follows: 1. HgbA1c, fasting lipid panel 2. MRI, MRA of the brain without contrast 3. PT consult, OT consult, Speech consult 4. Echocardiogram 5. Carotid dopplers 6. Prophylactic therapy-Antiplatelet med: Aspirin - dose 325mg  daily 7. NPO until RN stroke swallow screen 8. Telemetry monitoring 9. Frequent neuro checks 10. Hold Dilantin with daily Dilantin levels. Once therapeutic would restart at 200mg  daily.  11. Seizure precautions 12. Ativan prn seizures.  13. Continue Keppra at current dose  Near syncope: most likely due to ischemic stroke. -PT/OT  Epilepsy: No seizure episodes. Dilantin level is supratherapeutic. -Hold dilantin and repeat level -continue Keppra and check keppra level -prn Ativan for seizure -seizure precaution.  DM-II: Last A1c 7.0 on 12/10/12, well controled. Patient is taking glyburide and metformin at home -SSI -Check A1c  HLD: Last LDL was 79 on 04/15/12 -Continue home medications: Lipitor -Check FLP  HTN: -Continue home amlodipine, Coreg, lisinopril   Potassium (K) deficiency: K=4.1 -hold KCl -check BMP in AM  GERD: -Protonix  BPH: stable - Continue Flomax  Leukocytosis: No signs of infection. Likely due to stress induced de-margination -follow up by CBC   DVT ppx: SQ Heparin     Code Status: Full code Family Communication:  Yes, patient's brother   at bed side Disposition Plan: Admit to inpatient   Date of Service 06/16/2015    Lorretta Harp [facility] Pager [phone]  If 7PM-7AM, please contact night-coverage [email or web address] Password [email or web address] 06/16/2015, 3:18 AM

## 2015-06-16 ENCOUNTER — Inpatient Hospital Stay (HOSPITAL_COMMUNITY): Payer: Medicare Other

## 2015-06-16 DIAGNOSIS — I639 Cerebral infarction, unspecified: Secondary | ICD-10-CM | POA: Diagnosis present

## 2015-06-16 DIAGNOSIS — W19XXXA Unspecified fall, initial encounter: Secondary | ICD-10-CM | POA: Diagnosis present

## 2015-06-16 DIAGNOSIS — T420X1A Poisoning by hydantoin derivatives, accidental (unintentional), initial encounter: Secondary | ICD-10-CM

## 2015-06-16 DIAGNOSIS — D72829 Elevated white blood cell count, unspecified: Secondary | ICD-10-CM | POA: Diagnosis present

## 2015-06-16 DIAGNOSIS — N400 Enlarged prostate without lower urinary tract symptoms: Secondary | ICD-10-CM | POA: Diagnosis present

## 2015-06-16 DIAGNOSIS — G40909 Epilepsy, unspecified, not intractable, without status epilepticus: Secondary | ICD-10-CM

## 2015-06-16 LAB — CBG MONITORING, ED
GLUCOSE-CAPILLARY: 121 mg/dL — AB (ref 65–99)
Glucose-Capillary: 125 mg/dL — ABNORMAL HIGH (ref 65–99)
Glucose-Capillary: 130 mg/dL — ABNORMAL HIGH (ref 65–99)

## 2015-06-16 LAB — HEPATIC FUNCTION PANEL
ALK PHOS: 125 U/L (ref 38–126)
ALT: 17 U/L (ref 17–63)
AST: 27 U/L (ref 15–41)
Albumin: 3.8 g/dL (ref 3.5–5.0)
BILIRUBIN DIRECT: 0.1 mg/dL (ref 0.1–0.5)
BILIRUBIN INDIRECT: 0.6 mg/dL (ref 0.3–0.9)
BILIRUBIN TOTAL: 0.7 mg/dL (ref 0.3–1.2)
TOTAL PROTEIN: 6.9 g/dL (ref 6.5–8.1)

## 2015-06-16 LAB — PROTIME-INR
INR: 1.22 (ref 0.00–1.49)
Prothrombin Time: 15.5 seconds — ABNORMAL HIGH (ref 11.6–15.2)

## 2015-06-16 LAB — GLUCOSE, CAPILLARY
GLUCOSE-CAPILLARY: 128 mg/dL — AB (ref 65–99)
Glucose-Capillary: 127 mg/dL — ABNORMAL HIGH (ref 65–99)

## 2015-06-16 LAB — PHENYTOIN LEVEL, TOTAL
Phenytoin Lvl: 44.4 ug/mL (ref 10.0–20.0)
Phenytoin Lvl: 39.8 ug/mL (ref 10.0–20.0)

## 2015-06-16 LAB — APTT: aPTT: 38 seconds — ABNORMAL HIGH (ref 24–37)

## 2015-06-16 LAB — LIPID PANEL
Cholesterol: 101 mg/dL (ref 0–200)
HDL: 33 mg/dL — ABNORMAL LOW (ref >40)
LDL CALC: 51 mg/dL (ref 0–99)
TRIGLYCERIDES: 85 mg/dL (ref <150)
Total CHOL/HDL Ratio: 3.1 RATIO
VLDL: 17 mg/dL (ref 0–40)

## 2015-06-16 MED ORDER — ATORVASTATIN CALCIUM 40 MG PO TABS: 40.0000 mg | ORAL_TABLET | Freq: Every day | ORAL | Status: DC

## 2015-06-16 MED ORDER — LEVETIRACETAM 500 MG PO TABS
500.0000 mg | ORAL_TABLET | Freq: Once | ORAL | Status: DC
  Filled 2015-06-16: qty 1

## 2015-06-16 MED ORDER — STROKE: EARLY STAGES OF RECOVERY BOOK
Freq: Once | Status: DC
  Filled 2015-06-16: qty 1

## 2015-06-16 MED ORDER — TAMSULOSIN HCL 0.4 MG PO CAPS: 0.4000 mg | ORAL_CAPSULE | Freq: Every day | ORAL | Status: DC

## 2015-06-16 MED ORDER — LEVETIRACETAM 500 MG PO TABS: 500.0000 mg | ORAL_TABLET | Freq: Two times a day (BID) | ORAL | Status: DC

## 2015-06-16 MED ORDER — SODIUM CHLORIDE 0.9 % IV SOLN
INTRAVENOUS | Status: DC
  Administered 2015-06-16 – 2015-06-19 (×4): via INTRAVENOUS

## 2015-06-16 MED ORDER — POLYVINYL ALCOHOL 1.4 % OP SOLN
1.0000 [drp] | OPHTHALMIC | Status: DC | PRN
  Filled 2015-06-16: qty 15

## 2015-06-16 MED ORDER — ESCITALOPRAM OXALATE 10 MG PO TABS
5.0000 mg | ORAL_TABLET | Freq: Every day | ORAL | Status: DC
Start: 2015-06-16 — End: 2015-06-16

## 2015-06-16 MED ORDER — ASPIRIN EC 81 MG PO TBEC: 81.0000 mg | DELAYED_RELEASE_TABLET | Freq: Every day | ORAL | Status: DC

## 2015-06-16 MED ORDER — HEPARIN SODIUM (PORCINE) 5000 UNIT/ML IJ SOLN
5000.0000 [IU] | Freq: Three times a day (TID) | INTRAMUSCULAR | Status: DC
  Administered 2015-06-16 – 2015-06-22 (×20): 5000 [IU] via SUBCUTANEOUS
  Filled 2015-06-16 (×20): qty 1

## 2015-06-16 MED ORDER — CARVEDILOL 3.125 MG PO TABS: 3.1250 mg | ORAL_TABLET | Freq: Two times a day (BID) | ORAL | Status: DC

## 2015-06-16 MED ORDER — LISINOPRIL 20 MG PO TABS: 40.0000 mg | ORAL_TABLET | Freq: Every day | ORAL | Status: DC

## 2015-06-16 MED ORDER — ASPIRIN 325 MG PO TABS
325.0000 mg | ORAL_TABLET | Freq: Every day | ORAL | Status: DC
  Administered 2015-06-17 – 2015-06-22 (×6): 325 mg via ORAL
  Filled 2015-06-16 (×6): qty 1

## 2015-06-16 MED ORDER — HYDRALAZINE HCL 20 MG/ML IJ SOLN
10.0000 mg | Freq: Four times a day (QID) | INTRAMUSCULAR | Status: DC | PRN
  Administered 2015-06-16: 10 mg via INTRAVENOUS
  Filled 2015-06-16: qty 1

## 2015-06-16 MED ORDER — INSULIN ASPART 100 UNIT/ML ~~LOC~~ SOLN
0.0000 [IU] | SUBCUTANEOUS | Status: DC
  Administered 2015-06-16 – 2015-06-17 (×2): 1 [IU] via SUBCUTANEOUS

## 2015-06-16 MED ORDER — POLYETHYL GLYCOL-PROPYL GLYCOL 0.4-0.3 % OP SOLN: 1.0000 [drp] | Freq: Every day | OPHTHALMIC | Status: DC

## 2015-06-16 MED ORDER — LORAZEPAM 2 MG/ML IJ SOLN
1.0000 mg | INTRAMUSCULAR | Status: DC | PRN
  Administered 2015-06-16: 1 mg via INTRAVENOUS
  Filled 2015-06-16: qty 1

## 2015-06-16 MED ORDER — AMLODIPINE BESYLATE 5 MG PO TABS: 5.0000 mg | ORAL_TABLET | Freq: Every morning | ORAL | Status: DC

## 2015-06-16 MED ORDER — ASPIRIN 300 MG RE SUPP
300.0000 mg | Freq: Every day | RECTAL | Status: DC
  Administered 2015-06-16: 300 mg via RECTAL
  Filled 2015-06-16 (×2): qty 1

## 2015-06-16 MED ORDER — HALOPERIDOL LACTATE 5 MG/ML IJ SOLN
1.0000 mg | Freq: Four times a day (QID) | INTRAMUSCULAR | Status: DC | PRN
  Administered 2015-06-16: 2 mg via INTRAVENOUS
  Filled 2015-06-16: qty 1

## 2015-06-16 MED ORDER — INSULIN ASPART 100 UNIT/ML ~~LOC~~ SOLN: 0.0000 [IU] | Freq: Three times a day (TID) | SUBCUTANEOUS | Status: DC

## 2015-06-16 MED ORDER — SODIUM CHLORIDE 0.9 % IV SOLN
500.0000 mg | Freq: Two times a day (BID) | INTRAVENOUS | Status: DC
  Administered 2015-06-16 – 2015-06-18 (×4): 500 mg via INTRAVENOUS
  Filled 2015-06-16 (×5): qty 5

## 2015-06-16 MED ORDER — PANTOPRAZOLE SODIUM 40 MG PO TBEC: 40.0000 mg | DELAYED_RELEASE_TABLET | Freq: Every day | ORAL | Status: DC

## 2015-06-16 MED ORDER — SENNOSIDES-DOCUSATE SODIUM 8.6-50 MG PO TABS
1.0000 | ORAL_TABLET | Freq: Every evening | ORAL | Status: DC | PRN
Start: 2015-06-16 — End: 2015-06-16
  Filled 2015-06-16: qty 1

## 2015-06-16 MED ORDER — RISPERIDONE 0.5 MG PO TABS: 1.0000 mg | ORAL_TABLET | Freq: Every day | ORAL | Status: DC

## 2015-06-16 NOTE — ED Notes (Signed)
Critical lab given to Dr. Youlanda Roys

## 2015-06-16 NOTE — Consult Note (Signed)
Reason for Consult:Falls Referring Physician: Loretha Stapler  CC: Falls  HPI: Arthur Berry is an 70 y.o. male SNF resident who has been in his facility since 7/2.  Reports that on 8/1 had a near syncopal event.  No head injury was incurred.  Has been having frequent falls.  Patient was evaluated at [facility] hospital and released.  Today when brother visited had blurry vision and complaints of dizziness.  Needed help for transfers, etc due to feeling off balance.  Was in a wheelchair when in the television room.  Patient got up and fell forward.  Head injury incurred.  Patient brought back to the ED for further evaluation.    LKW: unknown tPA given;  No; unknown LKW  Past Medical History  Diagnosis Date  . Colon polyp 2005    Tubular Adenoma  . Hypertension 1999  . Arthritis   . Hyperlipidemia 1999  . Epilepsy   . Sleep apnea   . Chronic kidney disease   . Coronary artery disease, non-occlusive     Minimal nonobstructive CAD, nl LV systolic fxn by cath 12/11/12  . Goiter, nontoxic, multinodular     Thyroid US 11/2012  . Diabetes mellitus 2001    TYPE 2  . Neuromuscular disorder   . Splinter 04/27/13    metal plinter removed rt pointer finger  . GERD (gastroesophageal reflux disease) 04/23/2014  . Diverticulosis   . Internal hemorrhoids     Past Surgical History  Procedure Laterality Date  . Knee surgery  2008    Left  . Cholecystectomy    . Inguinal hernia repair  06/27/11    left  . Esophagogastroduodenoscopy  10/14/2012    normal   . Colonoscopy  12/28/2009  . Left heart catheterization with coronary angiogram N/A 12/11/2012    Procedure: LEFT HEART CATHETERIZATION WITH CORONARY ANGIOGRAM;  Surgeon: Tonny Bollman, MD;  Location: [facility] CATH LAB;  Service: Cardiovascular;  Laterality: N/A;    Family History  Problem Relation Age of Onset  . Breast cancer Sister   . Colon cancer      unknown  . Diabetes Sister   . Heart disease Mother   . Alzheimer's disease Father     Social  History:  reports that he quit smoking about 29 years ago. His smoking use included Cigars. He has never used smokeless tobacco. He reports that he does not drink alcohol or use illicit drugs.  No Known Allergies  Medications: I have reviewed the patient's current medications. Prior to Admission:  Prior to Admission medications   Medication Sig Start Date End Date Taking? Authorizing Provider  amLODipine (NORVASC) 5 MG tablet Take 5 mg by mouth every morning.    Yes Historical Provider, MD  aspirin EC 81 MG tablet Take 81 mg by mouth daily.   Yes Historical Provider, MD  atorvastatin (LIPITOR) 40 MG tablet Take 40 mg by mouth daily.   Yes Historical Provider, MD  carvedilol (COREG) 6.25 MG tablet Take 3.125 mg by mouth 2 (two) times daily with a meal.    Yes Historical Provider, MD  escitalopram (LEXAPRO) 5 MG tablet Take 5 mg by mouth daily.   Yes Historical Provider, MD  glyBURIDE-metformin (GLUCOVANCE) 2.5-500 MG per tablet Take 1 tablet by mouth 2 (two) times daily with a meal.   Yes Historical Provider, MD  levETIRAcetam (KEPPRA) 500 MG tablet Take 500 mg by mouth 2 (two) times daily.     Yes Historical Provider, MD  lisinopril (PRINIVIL,ZESTRIL) 40 MG tablet  Take 40 mg by mouth daily.   Yes Historical Provider, MD  pantoprazole (PROTONIX) 40 MG tablet Take 1 tablet (40 mg total) by mouth 2 (two) times daily. Patient taking differently: Take 40 mg by mouth daily.  04/23/14  Yes Jessica D Zehr, PA-C  phenytoin (DILANTIN) 100 MG ER capsule Take 400 mg by mouth daily.    Yes Historical Provider, MD  Polyethyl Glycol-Propyl Glycol (SYSTANE ULTRA OP) Place 1 drop into both eyes daily.   Yes Historical Provider, MD  potassium chloride SA (K-DUR,KLOR-CON) 20 MEQ tablet Take 20 mEq by mouth daily.    Yes Historical Provider, MD  risperiDONE (RISPERDAL) 1 MG tablet Take 1 mg by mouth at bedtime.   Yes Historical Provider, MD  tamsulosin (FLOMAX) 0.4 MG CAPS capsule Take 0.4 mg by mouth daily after  supper.   Yes Historical Provider, MD    ROS: History obtained from the patient  General ROS: negative for - chills, fatigue, fever, night sweats, weight gain or weight loss Psychological ROS: negative for - behavioral disorder, hallucinations, memory difficulties, mood swings or suicidal ideation Ophthalmic ROS: as noted in HPI ENT ROS: as noted in HPI Allergy and Immunology ROS: negative for - hives or itchy/watery eyes Hematological and Lymphatic ROS: negative for - bleeding problems, bruising or swollen lymph nodes Endocrine ROS: negative for - galactorrhea, hair pattern changes, polydipsia/polyuria or temperature intolerance Respiratory ROS: negative for - cough, hemoptysis, shortness of breath or wheezing Cardiovascular ROS: negative for - chest pain, dyspnea on exertion, edema or irregular heartbeat Gastrointestinal ROS: negative for - abdominal pain, diarrhea, hematemesis, nausea/vomiting or stool incontinence Genito-Urinary ROS: negative for - dysuria, hematuria, incontinence or urinary frequency/urgency Musculoskeletal ROS: negative for - joint swelling or muscular weakness Neurological ROS: as noted in HPI Dermatological ROS: negative for rash and skin lesion changes  Physical Examination: Blood pressure 151/81, pulse 76, temperature 98.3 F (36.8 C), temperature source Oral, resp. rate 18, SpO2 97 %.  HEENT-  Bruising noted on the right forehead and face.  Normal external eye and conjunctiva.  Normal TM's bilaterally.  Normal auditory canals and external ears. Normal external nose, mucus membranes and septum.  Normal pharynx. Cardiovascular- S1, S2 normal, pulses palpable throughout   Lungs- chest clear, no wheezing, rales, normal symmetric air entry Abdomen- soft, non-tender; bowel sounds normal; no masses,  no organomegaly Extremities- no edema Lymph-no adenopathy palpable Musculoskeletal-no joint tenderness, deformity or swelling Skin-bruising on lower  extremities  Neurological Examination Mental Status: Alert.  Speech fluent without evidence of aphasia.  Needs reinforcement to follow 3 step commands. Cranial Nerves: II: Discs flat bilaterally; Vision blurry with difficulty finding my fingers for coordination testing, pupils equal, round, reactive to light and accommodation III,IV, VI: ptosis not present, extra-ocular motions intact bilaterally with bilateral nystagmus V,VII: smile symmetric, facial light touch sensation normal bilaterally VIII: hearing normal bilaterally IX,X: gag reflex present XI: bilateral shoulder shrug XII: midline tongue extension Motor: Right : Upper extremity   5/5    Left:     Upper extremity   5/5  Lower extremity   5/5     Lower extremity   5/5 Increased tone on the right Sensory: Pinprick and light touch intact throughout, bilaterally Deep Tendon Reflexes: 2+ and symmetric in the upper extremities and at the knees.  AJ's absent bilaterally Plantars: Right: upgoing   Left: upgoing Cerebellar: Normal finger-to-nose testing bilaterally. Dysmetria with heel-to-shin testing, left greater than right Gait: not tested due to safety concerns   Laboratory  Studies:   Basic Metabolic Panel:  Recent Labs Lab 06/14/15 1622 06/15/15 2130  NA 141 141  K 4.3 4.1  CL 105 101  CO2 28 29  GLUCOSE 85 133*  BUN 24* 20  CREATININE 1.18 1.14  CALCIUM 8.9 9.4    Liver Function Tests: No results for input(s): AST, ALT, ALKPHOS, BILITOT, PROT, ALBUMIN in the last 168 hours. No results for input(s): LIPASE, AMYLASE in the last 168 hours. No results for input(s): AMMONIA in the last 168 hours.  CBC:  Recent Labs Lab 06/14/15 1622 06/15/15 2130  WBC 10.7* 11.0*  NEUTROABS 6.9 7.3  HGB 12.5* 12.8*  HCT 36.8* 36.5*  MCV 89.3 88.0  PLT 147* 141*    Cardiac Enzymes:  Recent Labs Lab 06/15/15 2130  TROPONINI <0.03    BNP: Invalid input(s): POCBNP  CBG: No results for input(s): GLUCAP in the last  168 hours.  Microbiology: Results for orders placed or performed during the hospital encounter of 04/14/12  MRSA PCR Screening     Status: None   Collection Time: 04/14/12 11:16 PM  Result Value Ref Range Status   MRSA by PCR NEGATIVE NEGATIVE Final    Comment:        The GeneXpert MRSA Assay (FDA approved for NASAL specimens only), is one component of a comprehensive MRSA colonization surveillance program. It is not intended to diagnose MRSA infection nor to guide or monitor treatment for MRSA infections.  Urine culture     Status: None   Collection Time: 04/14/12 11:45 PM  Result Value Ref Range Status   Specimen Description URINE, CLEAN CATCH  Final   Special Requests NONE  Final   Culture  Setup Time [id number]  Final   Colony Count NO GROWTH  Final   Culture NO GROWTH  Final   Report Status 04/16/2012 FINAL  Final  Wound culture     Status: None   Collection Time: 04/15/12  3:35 AM  Result Value Ref Range Status   Specimen Description WOUND RIGHT LEG  Final   Special Requests NONE  Final   Gram Stain   Final    RARE WBC PRESENT,BOTH PMN AND MONONUCLEAR NO SQUAMOUS EPITHELIAL CELLS SEEN NO ORGANISMS SEEN   Culture   Final    MULTIPLE ORGANISMS PRESENT, NONE PREDOMINANT Note: NO STAPHYLOCOCCUS AUREUS ISOLATED NO GROUP A STREP (S.PYOGENES) ISOLATED   Report Status 04/17/2012 FINAL  Final    Coagulation Studies: No results for input(s): LABPROT, INR in the last 72 hours.  Urinalysis:  Recent Labs Lab 06/14/15 1530 06/15/15 2200  COLORURINE YELLOW YELLOW  LABSPEC 1.010 1.009  PHURINE 5.5 7.0  GLUCOSEU NEGATIVE NEGATIVE  HGBUR MODERATE* TRACE*  BILIRUBINUR NEGATIVE NEGATIVE  KETONESUR NEGATIVE NEGATIVE  PROTEINUR NEGATIVE NEGATIVE  UROBILINOGEN 0.2 0.2  NITRITE NEGATIVE NEGATIVE  LEUKOCYTESUR NEGATIVE NEGATIVE    Lipid Panel:     Component Value Date/Time   CHOL 156 04/15/2012 0430   TRIG 161* 04/15/2012 0430   HDL 45 04/15/2012 0430   CHOLHDL 3.5  04/15/2012 0430   VLDL 32 04/15/2012 0430   LDLCALC 79 04/15/2012 0430    HgbA1C:  Lab Results  Component Value Date   HGBA1C 7.0* 12/10/2012    Urine Drug Screen:  No results found for: LABOPIA, COCAINSCRNUR, LABBENZ, AMPHETMU, THCU, LABBARB  Alcohol Level: No results for input(s): ETH in the last 168 hours.  Other results: EKG: sinus rhythm at 75 bpm.  Imaging: Dg Lumbar Spine Complete  06/15/2015   CLINICAL  DATA:  Status post fall, lower back pain. Initial encounter.  EXAM: LUMBAR SPINE - COMPLETE 4+ VIEW  COMPARISON:  Abdominal radiograph performed 06/29/2014  FINDINGS: There is no evidence of fracture or subluxation. Vertebral bodies demonstrate normal height and alignment. Intervertebral disc spaces are preserved. The visualized neural foramina are grossly unremarkable in appearance. Anterior osteophytes are noted along the lower thoracic and lumbar spine.  The visualized bowel gas pattern is unremarkable in appearance; air and stool are noted within the colon. The sacroiliac joints are within normal limits.  IMPRESSION: No evidence of fracture or subluxation along the lumbar spine.   Electronically Signed   By: Roanna Raider M.D.   On: 06/15/2015 21:28   Ct Head Wo Contrast  06/15/2015   ADDENDUM REPORT: 06/15/2015 22:56  ADDENDUM: CT - CERVICAL SPINE WITHOUT CONTRAST:  The vertebral column, pedicles and facet articulations are intact. There is no evidence of acute fracture. No acute soft tissue abnormalities are evident.  There are moderately severe degenerative disc changes with small posterior osteophytes at multiple levels. The thyroid goiter is again evident. It is incompletely imaged.  IMPRESSION: 1. Negative for acute cervical spine fracture 2. Incompletely imaged thyroid goiter. This was evaluated with ultrasonography on 12/11/2012.   Electronically Signed   By: Ellery Plunk M.D.   On: 06/15/2015 22:56   06/15/2015   CLINICAL DATA:  Larey Seat from wheelchair to carpeted floor.  Abrasion above the right eye. Patient was seen yesterday for a different fall.  EXAM: CT HEAD WITHOUT CONTRAST  TECHNIQUE: Contiguous axial images were obtained from the base of the skull through the vertex without intravenous contrast.  COMPARISON:  06/14/2015, 07/04/2012.  FINDINGS: There is no intracranial hemorrhage. There is agenesis of the corpus callosum and a dorsal interhemispheric cyst extending to the left. There is new hypodensity in the medial right occipital lobe which likely represents cytotoxic edema associated with a subacute infarction. No other significant interval changes are evident. There is mild mass effect on the right occipital horn. There is no midline shift.  There is a right frontal scalp hematoma. Calvarium and skullbase are intact.  IMPRESSION: 1. Subacute nonhemorrhagic right occipital infarction. No midline shift. Mild mass effect on the right occipital horn. 2. Congenital agenesis of the corpus callosum with a dorsal interhemispheric cyst extending to the left. 3. Right frontal scalp hematoma. No evidence of acute intracranial traumatic injury.  Electronically Signed: By: Ellery Plunk M.D. On: 06/15/2015 21:21   Ct Head Wo Contrast  06/14/2015   CLINICAL DATA:  [facility], patient denies loss of consciousness  EXAM: CT HEAD WITHOUT CONTRAST  TECHNIQUE: Contiguous axial images were obtained from the base of the skull through the vertex without intravenous contrast.  COMPARISON:  MRI performed 07/04/2012  FINDINGS: Again identified is agenesis of the corpus callosum with colpocephaly of the ventricles. There is a large dorsal interhemispheric cyst projecting to the left of midline. These findings are stable. There is cerebellar atrophy which is unchanged. There is mild low attenuation in the periventricular white matter, similar to prior study. No evidence of hemorrhage or extra-axial fluid. No skull fracture.  IMPRESSION: Stable  chronic ventricular dilatation and colpocephaly, with chronic age-related involutional change and no acute findings.   Electronically Signed   By: Esperanza Heir M.D.   On: 06/14/2015 16:37   Ct Cervical Spine Wo Contrast  06/15/2015   ADDENDUM REPORT: 06/15/2015 22:56  ADDENDUM: CT - CERVICAL SPINE WITHOUT CONTRAST:  The vertebral column, pedicles and facet articulations are intact. There is no evidence of acute fracture. No acute soft tissue abnormalities are evident.  There are moderately severe degenerative disc changes with small posterior osteophytes at multiple levels. The thyroid goiter is again evident. It is incompletely imaged.  IMPRESSION: 1. Negative for acute cervical spine fracture 2. Incompletely imaged thyroid goiter. This was evaluated with ultrasonography on 12/11/2012.   Electronically Signed   By: Ellery Plunk M.D.   On: 06/15/2015 22:56   06/15/2015   CLINICAL DATA:  Larey Seat from wheelchair to carpeted floor. Abrasion above the right eye. Patient was seen yesterday for a different fall.  EXAM: CT HEAD WITHOUT CONTRAST  TECHNIQUE: Contiguous axial images were obtained from the base of the skull through the vertex without intravenous contrast.  COMPARISON:  06/14/2015, 07/04/2012.  FINDINGS: There is no intracranial hemorrhage. There is agenesis of the corpus callosum and a dorsal interhemispheric cyst extending to the left. There is new hypodensity in the medial right occipital lobe which likely represents cytotoxic edema associated with a subacute infarction. No other significant interval changes are evident. There is mild mass effect on the right occipital horn. There is no midline shift.  There is a right frontal scalp hematoma. Calvarium and skullbase are intact.  IMPRESSION: 1. Subacute nonhemorrhagic right occipital infarction. No midline shift. Mild mass effect on the right occipital horn. 2. Congenital agenesis of the corpus callosum with a dorsal interhemispheric cyst extending to  the left. 3. Right frontal scalp hematoma. No evidence of acute intracranial traumatic injury.  Electronically Signed: By: Ellery Plunk M.D. On: 06/15/2015 21:21     Assessment/Plan: 69 year old male presenting with frequent falls.  Dilantin level is elevated at 44.  Head CT has been independently reviewed and shows congential agenesis of the corpus callosum with a dorsal interhemispheric cyst extending to the left.  There is also an areas of hypodensity in the right occipital lobe concerning for a subacute infarct.  Suspicious that falls are related to high Dilantin level.  Patient may have had some noncompliance issues prior to placement.  Will further work up stroke as well.  Patient with risk factors of hypertension, hyperlipidemia and diabetes.  Recommendations: 1. HgbA1c, fasting lipid panel, LFT's 2. MRI, MRA  of the brain without contrast 3. PT consult, OT consult, Speech consult 4. Echocardiogram 5. Carotid dopplers 6. Prophylactic therapy-Antiplatelet med: Aspirin - dose 325mg  daily 7. NPO until RN stroke swallow screen 8. Telemetry monitoring 9. Frequent neuro checks 10.  Hold Dilantin with daily Dilantin levels.  Once therapeutic would restart at 200mg  daily.   11.  Seizure precautions 12.  Ativan prn seizures.   13.  Continue Keppra at current dose   Thana Farr, MD [facility] [phone] 06/16/2015, 12:28 AM

## 2015-06-16 NOTE — Social Work (Signed)
9:57AM Patient is from Assisted Living Facility. CSW attempted to speak with patient to complete social work assessment. Patient is currently resting. No family at the bedside. CSW attempted to contact patient's sister Nita Sickle) via telephone at [phone] but received no answer. CSW left voice message at 9:52am. CSW also attempted to contact patient's brother [name] Delfavero) via telephone at [phone]. Left voice message at 9:53am. CSW to follow up with patient and family.   Fernande Boyden, LCSWA Clinical Social Worker [facility] Ph: [phone]

## 2015-06-16 NOTE — ED Notes (Addendum)
Dr. Sharon Seller paged and notified of bp trend elevation,  last systolic blood pressure 163/114 relayed to MD. Md will place order in computer for IV hydralazine since patient is NPO

## 2015-06-16 NOTE — Clinical Social Work Note (Signed)
Clinical Social Work Assessment  Patient Details  Name: Arthur Berry MRN: [id number] Date of Birth: November 25, 1945  Date of referral:  06/16/15               Reason for consult:  Facility Placement                Permission sought to share information with:  Facility Medical sales representative, Family Supports Permission granted to share information::  Yes, Verbal Permission Granted  Name::     Arthur Berry: POA  Agency::  [facility]   Relationship::     Contact Information:     Housing/Transportation Living arrangements for the past 2 months:  Assisted Dealer of Information:  Patient, Power of Pensions consultant, Facility Patient Interpreter Needed:  None Criminal Activity/Legal Involvement Pertinent to Current Situation/Hospitalization:  No - Comment as needed Significant Relationships:    Lives with:  Facility Resident Do you feel safe going back to the place where you live?  Yes Need for family participation in patient care:  Yes (Comment)  Care giving concerns: Patient's brother Arthur Berry is the POA. Brother currently reports no concerns regarding the patient returning back to [facility]. Facility Representative Tammy currently reports no concerns regarding the patient returning back to the facility.    Social Worker assessment / plan:  Patient is from [facility]. Per brother, patient has been at the [facility] since May 18, 2015. CSW spoke with patient who reports he has been at [facility] for more than a month, and would like to return to that facility once medically stable and ready for discharge. CSW made contact with patient's brother who reports no concerns at this time. CSW contacted [facility] to inform of patient's admission into the hospital. Facility is willing to take the patient back once medically stable. CSW to complete FL2 and handoff to unit social worker.   Employment status:   Retired Health and safety inspector:  Medicare (Medicare Part A and B; BCBS) PT Recommendations:  Not assessed at this time Information / Referral to community resources:  Skilled Nursing Facility  Patient/Family's Response to care:  Brother Arthur Nimrod Rossman) was informed of patient's admission into the hospital. Brother states he is hopeful that the patient would receive the treatment he needs. Brother is not concerned with patient returning back to [facility], and states that the facility has been helpful to many family and church members.   Patient/Family's Understanding of and Emotional Response to Diagnosis, Current Treatment, and Prognosis:  Patient and brother is understanding of diagnosis,current treatment, and prognosis.   Emotional Assessment Appearance:  Appears stated age Attitude/Demeanor/Rapport:  Other (Calm and cooperative ) Affect (typically observed):  Appropriate, Pleasant, Calm Orientation:   (Not assessed at this time) Alcohol / Substance use:  Not Applicable Psych involvement (Current and /or in the community):  No (Comment)  Discharge Needs  Concerns to be addressed:  Denies Needs/Concerns at this time Readmission within the last 30 days:  No Current discharge risk:  None Barriers to Discharge:  Barriers Resolved   Loleta Dicker, LCSW 06/16/2015, 11:00 AM

## 2015-06-16 NOTE — ED Notes (Signed)
Pt is in room screaming and cursing saying he is leaving and we can not hold him against his will. This RN explained the delay to the patient as well as the plan of care.

## 2015-06-16 NOTE — ED Notes (Signed)
Patient transported to MRI 

## 2015-06-16 NOTE — ED Notes (Signed)
Family at bedside. 

## 2015-06-16 NOTE — Progress Notes (Signed)
Pt arrived to 4N31.  Alert with intermittent confusion.  Denies any pain.  Bruising noted to left hip and contusion to rt forehead above eye.  Family at bedside.  Pt is NPO.  Dr. Sharon Seller notified of request for IV fluids.  Bed alarm set, call bell within reach and pt verbalizes understanding to call before attempting to get out of bed.  Telemetry placed on patient and CCMD called.  Will continue to monitor and assess.

## 2015-06-16 NOTE — ED Notes (Signed)
CBG = 121  RN Nikki informed of results.

## 2015-06-16 NOTE — Progress Notes (Signed)
[place] TEAM 1 - Stepdown/ICU TEAM Progress Note  Arthur Berry:[id number] DOB: 04-Jun-1946 DOA: 06/15/2015 PCP: [name], MD  Admit HPI / Brief Narrative: 69 y.o. male with Hx of hypertension, hyperlipidemia, diabetes mellitus, GERD, seizure, CAD, goiter, BPH, who presented with near syncope and a fall.  In ED, patient was found to have negative troponin, negative urinalysis, WBC 11.0, temperature normal, no tachycardia, electrolytes okay. CT C-spine was negative for bony fracture. CT head showed subacute nonhemorrhagic right occipital infarction. No midline shift. Mild mass effect on the right occipital horn. Congenital agenesis of the corpus callosum with a dorsal interhemispheric cyst extending to the left. Right frontal scalp hematoma. No evidence of acute intracranial traumatic injury. Initial dilantin level was >50.   HPI/Subjective: The patient is alert and conversant.  Remarkably he denies headache.  There is no chest pain shortness of breath fevers chills or abdominal pain.  He is anxious to be discharged the hospital.  Assessment/Plan:  B occipital lobe CVAs Stroke team following and directing workup - MRA was not able to be accomplished today due to patient agitation - will reorder tomorrow if mental status improves  Dilantin toxicity Level 44 at presentation - holding further dosing and following serial levels with gentle hydration  Seizure d/o Change Keppra to IV dosing until patient passes his swallowing eval  DM2 CBGs well controlled while patient nothing by mouth - follow with sliding scale only for now  HLD Unable to take oral medications at this time  HTN BP reasonably controlled - avoid overaggressive correction in setting of acute CVAs  GERD  CAD Quiescent  BPH  Code Status: FULL Family Communication: no family present at time of exam Disposition Plan: Stable for telemetry/neuro  bed  Consultants: Neurology  Procedures: none  Antibiotics: none  DVT prophylaxis: SCDs  Objective: Blood pressure 143/80, pulse 86, temperature 98.3 F (36.8 C), temperature source Oral, resp. rate 14, SpO2 97 %.  Intake/Output Summary (Last 24 hours) at 06/16/15 1208 Last data filed at 06/16/15 1000  Gross per 24 hour  Intake      0 ml  Output    350 ml  Net   -350 ml   Exam: General: No acute respiratory distress - hematoma/contusion right periorbital/4 head region Lungs: Clear to auscultation bilaterally without wheezes or crackles Cardiovascular: Regular rate and rhythm without murmur gallop or rub normal S1 and S2 Abdomen: Nontender, nondistended, soft, bowel sounds positive, no rebound, no ascites, no appreciable mass Extremities: No significant cyanosis, clubbing, or edema bilateral lower extremities  Data Reviewed: Basic Metabolic Panel:  Recent Labs Lab 06/14/15 1622 06/15/15 2130  NA 141 141  K 4.3 4.1  CL 105 101  CO2 28 29  GLUCOSE 85 133*  BUN 24* 20  CREATININE 1.18 1.14  CALCIUM 8.9 9.4    CBC:  Recent Labs Lab 06/14/15 1622 06/15/15 2130  WBC 10.7* 11.0*  NEUTROABS 6.9 7.3  HGB 12.5* 12.8*  HCT 36.8* 36.5*  MCV 89.3 88.0  PLT 147* 141*    Liver Function Tests:  Recent Labs Lab 06/16/15 0212  AST 27  ALT 17  ALKPHOS 125  BILITOT 0.7  PROT 6.9  ALBUMIN 3.8   Coags:  Recent Labs Lab 06/16/15 0212  INR 1.22    Recent Labs Lab 06/16/15 0212  APTT 38*    Cardiac Enzymes:  Recent Labs Lab 06/15/15 2130  TROPONINI <0.03    CBG:  Recent Labs Lab 06/16/15 0733  GLUCAP 121*  No results found for this or any previous visit (from the past 240 hour(s)).   Studies:   Recent x-ray studies have been reviewed in detail by the Attending Physician  Scheduled Meds:  Scheduled Meds: .  stroke: mapping our early stages of recovery book   Does not apply Once  . amLODipine  5 mg Oral q morning - 10a  .  aspirin  300 mg Rectal Daily   Or  . aspirin  325 mg Oral Daily  . atorvastatin  40 mg Oral Daily  . carvedilol  3.125 mg Oral BID WC  . escitalopram  5 mg Oral Daily  . heparin  5,000 Units Subcutaneous 3 times per day  . insulin aspart  0-9 Units Subcutaneous TID WC  . levETIRAcetam  500 mg Oral BID  . lisinopril  40 mg Oral Daily  . pantoprazole  40 mg Oral Daily  . Polyethyl Glycol-Propyl Glycol  1 drop Both Eyes Daily  . risperiDONE  1 mg Oral QHS  . tamsulosin  0.4 mg Oral QPC supper    Time spent on care of this patient: 35 mins   MCCLUNG,JEFFREY T , MD   [facility] Office  [phone] Pager - Text Page per Loretha Stapler as per below:  On-Call/Text Page:      [email or web address]      password [email or web address]  If 7PM-7AM, please contact night-coverage [email or web address] Password [email or web address] 06/16/2015, 12:08 PM   LOS: 1 day

## 2015-06-17 ENCOUNTER — Inpatient Hospital Stay (HOSPITAL_COMMUNITY): Payer: Medicare Other

## 2015-06-17 DIAGNOSIS — I63532 Cerebral infarction due to unspecified occlusion or stenosis of left posterior cerebral artery: Secondary | ICD-10-CM

## 2015-06-17 DIAGNOSIS — I639 Cerebral infarction, unspecified: Secondary | ICD-10-CM

## 2015-06-17 DIAGNOSIS — I6789 Other cerebrovascular disease: Secondary | ICD-10-CM

## 2015-06-17 LAB — CBC
HEMATOCRIT: 35.1 % — AB (ref 39.0–52.0)
Hemoglobin: 12.4 g/dL — ABNORMAL LOW (ref 13.0–17.0)
MCH: 31.1 pg (ref 26.0–34.0)
MCHC: 35.3 g/dL (ref 30.0–36.0)
MCV: 88 fL (ref 78.0–100.0)
PLATELETS: 139 10*3/uL — AB (ref 150–400)
RBC: 3.99 MIL/uL — ABNORMAL LOW (ref 4.22–5.81)
RDW: 12.5 % (ref 11.5–15.5)
WBC: 9.2 10*3/uL (ref 4.0–10.5)

## 2015-06-17 LAB — GLUCOSE, CAPILLARY
GLUCOSE-CAPILLARY: 103 mg/dL — AB (ref 65–99)
GLUCOSE-CAPILLARY: 102 mg/dL — AB (ref 65–99)
Glucose-Capillary: 108 mg/dL — ABNORMAL HIGH (ref 65–99)
Glucose-Capillary: 113 mg/dL — ABNORMAL HIGH (ref 65–99)
Glucose-Capillary: 126 mg/dL — ABNORMAL HIGH (ref 65–99)
Glucose-Capillary: 128 mg/dL — ABNORMAL HIGH (ref 65–99)
Glucose-Capillary: 139 mg/dL — ABNORMAL HIGH (ref 65–99)

## 2015-06-17 LAB — BASIC METABOLIC PANEL
Anion gap: 11 (ref 5–15)
BUN: 12 mg/dL (ref 6–20)
CO2: 28 mmol/L (ref 22–32)
CREATININE: 0.99 mg/dL (ref 0.61–1.24)
Calcium: 8.5 mg/dL — ABNORMAL LOW (ref 8.9–10.3)
Chloride: 99 mmol/L — ABNORMAL LOW (ref 101–111)
GLUCOSE: 114 mg/dL — AB (ref 65–99)
Potassium: 3.5 mmol/L (ref 3.5–5.1)
SODIUM: 138 mmol/L (ref 135–145)

## 2015-06-17 LAB — HEMOGLOBIN A1C
Hgb A1c MFr Bld: 7 % — ABNORMAL HIGH (ref 4.8–5.6)
MEAN PLASMA GLUCOSE: 154 mg/dL

## 2015-06-17 LAB — PHENYTOIN LEVEL, TOTAL: PHENYTOIN LVL: 36.2 ug/mL — AB (ref 10.0–20.0)

## 2015-06-17 MED ORDER — INSULIN ASPART 100 UNIT/ML ~~LOC~~ SOLN: 0.0000 [IU] | Freq: Every day | SUBCUTANEOUS | Status: DC

## 2015-06-17 MED ORDER — IOHEXOL 350 MG/ML SOLN
50.0000 mL | Freq: Once | INTRAVENOUS | Status: AC | PRN
  Administered 2015-06-17: 50 mL via INTRAVENOUS

## 2015-06-17 MED ORDER — ACETAMINOPHEN 325 MG PO TABS
650.0000 mg | ORAL_TABLET | Freq: Four times a day (QID) | ORAL | Status: DC | PRN
  Administered 2015-06-17 – 2015-06-19 (×2): 650 mg via ORAL
  Filled 2015-06-17 (×2): qty 2

## 2015-06-17 MED ORDER — INSULIN ASPART 100 UNIT/ML ~~LOC~~ SOLN
0.0000 [IU] | Freq: Three times a day (TID) | SUBCUTANEOUS | Status: DC
  Administered 2015-06-18 (×2): 1 [IU] via SUBCUTANEOUS
  Administered 2015-06-19: 2 [IU] via SUBCUTANEOUS
  Administered 2015-06-19: 1 [IU] via SUBCUTANEOUS
  Administered 2015-06-20: 2 [IU] via SUBCUTANEOUS
  Administered 2015-06-21: 3 [IU] via SUBCUTANEOUS

## 2015-06-17 NOTE — Progress Notes (Signed)
EEG Completed; Results Pending  

## 2015-06-17 NOTE — Care Management Important Message (Signed)
Important Message  Patient Details  Name: Arthur Berry MRN: [id number] Date of Birth: 1946-10-21   Medicare Important Message Given:  Yes-second notification given    Orson Aloe 06/17/2015, 2:22 PM

## 2015-06-17 NOTE — Clinical Documentation Improvement (Signed)
  06/16/15 H&P:  "Past Medical History of Chronic Kidney Disease"  Please specify the disease stage if possible. GFR= > 60 BUN= 12 Crea= .99  CKD Stage I - GFR  > or = 90 CKD Stage II - GFR 60-80 CKD Stage III - GFR 30-59 CKD Stage IV - GFR 15-29 CKD Stage V - GFR < 15 ESRD Unable to suspect or determine  Thank you!  Sharyn Creamer, BSN, RN [place] HIM/Clinical Documentation Specialist [email or web address] [phone]

## 2015-06-17 NOTE — Evaluation (Signed)
Clinical/Bedside Swallow Evaluation Patient Details  Name: BRAIAN TIJERINA MRN: [id number] Date of Birth: 1946-08-31  Today's Date: 06/17/2015 Time: SLP Start Time (ACUTE ONLY): 7829 SLP Stop Time (ACUTE ONLY): 0759 SLP Time Calculation (min) (ACUTE ONLY): 11 min  Past Medical History:  Past Medical History  Diagnosis Date  . Colon polyp 2005    Tubular Adenoma  . Hypertension 1999  . Arthritis   . Hyperlipidemia 1999  . Epilepsy   . Sleep apnea   . Chronic kidney disease   . Coronary artery disease, non-occlusive     Minimal nonobstructive CAD, nl LV systolic fxn by cath 12/11/12  . Goiter, nontoxic, multinodular     Thyroid US 11/2012  . Diabetes mellitus 2001    TYPE 2  . Neuromuscular disorder   . Splinter 04/27/13    metal plinter removed rt pointer finger  . GERD (gastroesophageal reflux disease) 04/23/2014  . Diverticulosis   . Internal hemorrhoids    Past Surgical History:  Past Surgical History  Procedure Laterality Date  . Knee surgery  2008    Left  . Cholecystectomy    . Inguinal hernia repair  06/27/11    left  . Esophagogastroduodenoscopy  10/14/2012    normal   . Colonoscopy  12/28/2009  . Left heart catheterization with coronary angiogram N/A 12/11/2012    Procedure: LEFT HEART CATHETERIZATION WITH CORONARY ANGIOGRAM;  Surgeon: Tonny Bollman, MD;  [facility] CATH LAB;  Service: Cardiovascular;  Laterality: N/A;   HPI:  69 yo male resident of [facility] presents to [facility] after vision changes and fall.  Pt has been at his facility sine 7/2 per neurology note.   CT head showed subacute nonhemmorhagic right occipital lobe CVA.  Pt unable to tolerate MRI yesterday due to agitation.  PMH + for GERD, goiter, CAD, fall, epilepsy, BPH.      Assessment / Plan / Recommendation Clinical Impression  Pt presents with functional oropharyngeal swallow based on clinical evaluation.  CN exam unremarkable and pt with no s/s of aspiration.  SLP observed pt  consuming graham cracker, applesauce and water.  Vertical mastication pattern with pt taking small bites independently.  Even with trial of 3 ounce water test, pt did not sequentially swallow water indicating natural small bolus selection.  Recommend regular/thin diet with general precautions.  Pt does admit to occasional coughing with foods - has h/o goiter and GERD.  No SlP follow up indicated re: swallowing.    Aspiration Risk  Mild    Diet Recommendation Age appropriate regular solids;Thin   Medication Administration: Whole meds with liquid    Other  Recommendations Oral Care Recommendations: Oral care BID   Follow Up Recommendations       Frequency and Duration        Pertinent Vitals/Pain Afebrile, decreased     Swallow Study Prior Functional Status   see hhx     General Date of Onset: 06/16/15 Other Pertinent Information: 69 yo male resident of [facility] presents to [facility] after vision changes and fall.  Pt has been at his facility sine 7/2 per neurology note.   CT head showed subacute nonhemmorhagic right occipital lobe CVA.  Pt unable to tolerate MRI yesterday due to agitation.  PMH + for GERD, goiter, CAD, fall, epilepsy, BPH.    Type of Study: Bedside swallow evaluation Diet Prior to this Study: NPO Temperature Spikes Noted: No Respiratory Status: Room air History of Recent Intubation: No Behavior/Cognition: Alert;Cooperative;Pleasant mood Oral  Cavity - Dentition: Adequate natural dentition/normal for age Self-Feeding Abilities: Able to feed self Patient Positioning: Upright in bed Baseline Vocal Quality: Normal Volitional Cough: Strong Volitional Swallow: Able to elicit    Oral/Motor/Sensory Function Overall Oral Motor/Sensory Function: Appears within functional limits for tasks assessed   Ice Chips Ice chips: Within functional limits   Thin Liquid Thin Liquid: Within functional limits Presentation: Cup;Straw    Nectar Thick Nectar Thick Liquid:  Not tested   Honey Thick Honey Thick Liquid: Not tested   Puree Puree: Within functional limits Presentation: Self Fed;Spoon   Solid   GO    Solid: Impaired Presentation: Self Fed Oral Phase Impairments: Impaired mastication (slow mastication, vertical mastication pattern intermittently noted)      Donavan Burnet, MS [facility] SLP [phone]
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
## 2015-06-17 NOTE — Progress Notes (Signed)
*  PRELIMINARY RESULTS* Echocardiogram 2D Echocardiogram has been performed.  Arthur Berry 06/17/2015, 3:37 PM

## 2015-06-17 NOTE — Progress Notes (Addendum)
[facility] PROGRESS NOTE  Arthur Berry ZOX:[id number] DOB: 1946/04/07 DOA: 06/15/2015 PCP: [name], MD  Assessment/Plan:  Principal Problem:   Stroke workup underway Active Problems:   Diabetes mellitus without complication   HLD (hyperlipidemia)   Essential hypertension, benign   Potassium (K) deficiency   GERD (gastroesophageal reflux disease)   Fall secondary to dilatntin toxicity   Epilepsy   BPH (benign prostatic hyperplasia) Delirium  D/w sister HPI/Subjective: No complaints  Objective: Filed Vitals:   06/17/15 1800  BP: 138/68  Pulse: 74  Temp: 99 F (37.2 C)  Resp: 20    Intake/Output Summary (Last 24 hours) at 06/17/15 1935 Last data filed at 06/17/15 8119  Gross per 24 hour  Intake    900 ml  Output    550 ml  Net    350 ml   There were no vitals filed for this visit.  Exam:   General:  Disoriented to time.  Heent: right frontal contusion  Cardiovascular: RRR without mgr  Respiratory: CTA without WRR  Abdomen: S, nt, nd  Ext: no CCE  Basic Metabolic Panel:  Recent Labs Lab 06/14/15 1622 06/15/15 2130 06/17/15 0749  NA 141 141 138  K 4.3 4.1 3.5  CL 105 101 99*  CO2 28 29 28   GLUCOSE 85 133* 114*  BUN 24* 20 12  CREATININE 1.18 1.14 0.99  CALCIUM 8.9 9.4 8.5*   Liver Function Tests:  Recent Labs Lab 06/16/15 0212  AST 27  ALT 17  ALKPHOS 125  BILITOT 0.7  PROT 6.9  ALBUMIN 3.8   No results for input(s): LIPASE, AMYLASE in the last 168 hours. No results for input(s): AMMONIA in the last 168 hours. CBC:  Recent Labs Lab 06/14/15 1622 06/15/15 2130 06/17/15 0749  WBC 10.7* 11.0* 9.2  NEUTROABS 6.9 7.3  --   HGB 12.5* 12.8* 12.4*  HCT 36.8* 36.5* 35.1*  MCV 89.3 88.0 88.0  PLT 147* 141* 139*   Cardiac Enzymes:  Recent Labs Lab 06/15/15 2130  TROPONINI <0.03   BNP (last 3 results) No results for input(s): BNP in the last 8760 hours.  ProBNP (last 3 results) No results for input(s):  PROBNP in the last 8760 hours.  CBG:  Recent Labs Lab 06/17/15 0009 06/17/15 0403 06/17/15 0759 06/17/15 1139 06/17/15 1629  GLUCAP 108* 103* 102* 113* 139*    No results found for this or any previous visit (from the past 240 hour(s)).   Studies: Ct Angio Head W/cm &/or Wo Cm  06/17/2015   CLINICAL DATA:  Recent fall.  Visual changes.  EXAM: CT ANGIOGRAPHY HEAD AND NECK  TECHNIQUE: Multidetector CT imaging of the head and neck was performed using the standard protocol during bolus administration of intravenous contrast. Multiplanar CT image reconstructions and MIPs were obtained to evaluate the vascular anatomy. Carotid stenosis measurements (when applicable) are obtained utilizing NASCET criteria, using the distal internal carotid diameter as the denominator.  CONTRAST:  50mL OMNIPAQUE IOHEXOL 350 MG/ML SOLN  COMPARISON:  MRI brain 06/16/2015. CT head without contrast 06/15/2015.  FINDINGS: CT HEAD  Brain: The medial right occipital lobe infarct is again noted. There is no evidence for hemorrhage. Left ACA territory encephalomalacia is stable. A prominent left hemispheric cyst is again seen with mass effect.  Right supraorbital scalp soft tissue swelling is evident.  Calvarium and skull base: The calvarium is intact. Skullbase is unremarkable.  Paranasal sinuses: Clear  Orbits: The globes and orbits are intact.  CTA NECK  Aortic  arch: A 3 vessel arch configuration is present without significant atherosclerotic calcification or stenosis.  Right carotid system: The right common carotid artery is within normal limits. Mild atherosclerotic changes are noted at the right carotid bifurcation without significant stenosis. The more distal cervical right ICA is within normal limits.  Left carotid system: The left common carotid artery is within normal limits. Atherosclerotic changes are present at the bifurcation without a significant stenosis relative to the more distal vessel. The cervical left ICA is  within normal limits.  Vertebral arteries:The vertebral arteries originate from the subclavian arteries bilaterally. The right vertebral artery is the dominant vessel. There are no significant stenoses or evidence for focal injury within the vertebral arteries of the neck.  Skeleton: Multilevel endplate degenerative change is present. Prominent endplate osteophyte formation and uncovertebral spurring is most pronounced at C3-4 and C5-6. Multilevel endplate sclerotic changes are degenerative. No other focal lytic or blastic lesions are evident.  Other neck: Asymmetric left-sided thyroid goiter is again noted without significant interval change.  CTA HEAD  Anterior circulation: Minimal atherosclerotic calcifications are present within the cavernous internal carotid arteries bilaterally without significant stenosis. The A1 and M1 segments are within normal limits. The right A1 segment is dominant. The anterior communicating artery is patent. The MCA bifurcations are intact. There is moderate attenuation of more distal MCA branch vessels bilaterally, particularly on the left. ACA branch vessels are displaced by the midline cyst.  Posterior circulation: The vertebral arteries are within normal limits bilaterally. PICA origins are visualized and normal. Both posterior cerebral arteries originate from the basilar tip. A prominent right posterior communicating artery contributes. Moderate attenuation of PCA branch vessels is evident bilaterally. There is a moderate focal right P2 segment stenosis of 50-70% relative to the more distal vessel.  Venous sinuses: The dural sinuses are patent. The right transverse sinus is dominant. The straight sinus and deep cerebral veins are patent. Cortical veins are intact.  Anatomic variants: None  Delayed phase: The postcontrast images demonstrate no pathologic enhancement.  IMPRESSION: 1. Moderate proximal right PCA stenosis corresponds with the right occipital lobe infarct. 2. No  significant change in the size the right occipital lobe infarct or hemorrhage. 3. Remote encephalomalacia of the anterior left frontal lobe. 4. Midline cyst on the left with mass effect as described. 5. Mild diffuse small vessel disease evident on the MRA. 6. Multilevel degenerative changes throughout the cervical spine are most pronounced at C3-4 and C5-6. 7. Atherosclerotic changes within the carotid bifurcations bilaterally without significant stenosis.   Electronically Signed   By: Marin Roberts M.D.   On: 06/17/2015 09:48   Dg Lumbar Spine Complete  06/15/2015   CLINICAL DATA:  Status post fall, lower back pain. Initial encounter.  EXAM: LUMBAR SPINE - COMPLETE 4+ VIEW  COMPARISON:  Abdominal radiograph performed 06/29/2014  FINDINGS: There is no evidence of fracture or subluxation. Vertebral bodies demonstrate normal height and alignment. Intervertebral disc spaces are preserved. The visualized neural foramina are grossly unremarkable in appearance. Anterior osteophytes are noted along the lower thoracic and lumbar spine.  The visualized bowel gas pattern is unremarkable in appearance; air and stool are noted within the colon. The sacroiliac joints are within normal limits.  IMPRESSION: No evidence of fracture or subluxation along the lumbar spine.   Electronically Signed   By: Roanna Raider M.D.   On: 06/15/2015 21:28   Ct Head Wo Contrast  06/15/2015   ADDENDUM REPORT: 06/15/2015 22:56  ADDENDUM: CT - CERVICAL SPINE  WITHOUT CONTRAST:  The vertebral column, pedicles and facet articulations are intact. There is no evidence of acute fracture. No acute soft tissue abnormalities are evident.  There are moderately severe degenerative disc changes with small posterior osteophytes at multiple levels. The thyroid goiter is again evident. It is incompletely imaged.  IMPRESSION: 1. Negative for acute cervical spine fracture 2. Incompletely imaged thyroid goiter. This was evaluated with ultrasonography on  12/11/2012.   Electronically Signed   By: Ellery Plunk M.D.   On: 06/15/2015 22:56   06/15/2015   CLINICAL DATA:  Larey Seat from wheelchair to carpeted floor. Abrasion above the right eye. Patient was seen yesterday for a different fall.  EXAM: CT HEAD WITHOUT CONTRAST  TECHNIQUE: Contiguous axial images were obtained from the base of the skull through the vertex without intravenous contrast.  COMPARISON:  06/14/2015, 07/04/2012.  FINDINGS: There is no intracranial hemorrhage. There is agenesis of the corpus callosum and a dorsal interhemispheric cyst extending to the left. There is new hypodensity in the medial right occipital lobe which likely represents cytotoxic edema associated with a subacute infarction. No other significant interval changes are evident. There is mild mass effect on the right occipital horn. There is no midline shift.  There is a right frontal scalp hematoma. Calvarium and skullbase are intact.  IMPRESSION: 1. Subacute nonhemorrhagic right occipital infarction. No midline shift. Mild mass effect on the right occipital horn. 2. Congenital agenesis of the corpus callosum with a dorsal interhemispheric cyst extending to the left. 3. Right frontal scalp hematoma. No evidence of acute intracranial traumatic injury.  Electronically Signed: By: Ellery Plunk M.D. On: 06/15/2015 21:21   Ct Angio Neck W/cm &/or Wo/cm  06/17/2015   CLINICAL DATA:  Recent fall.  Visual changes.  EXAM: CT ANGIOGRAPHY HEAD AND NECK  TECHNIQUE: Multidetector CT imaging of the head and neck was performed using the standard protocol during bolus administration of intravenous contrast. Multiplanar CT image reconstructions and MIPs were obtained to evaluate the vascular anatomy. Carotid stenosis measurements (when applicable) are obtained utilizing NASCET criteria, using the distal internal carotid diameter as the denominator.  CONTRAST:  50mL OMNIPAQUE IOHEXOL 350 MG/ML SOLN  COMPARISON:  MRI brain 06/16/2015. CT head  without contrast 06/15/2015.  FINDINGS: CT HEAD  Brain: The medial right occipital lobe infarct is again noted. There is no evidence for hemorrhage. Left ACA territory encephalomalacia is stable. A prominent left hemispheric cyst is again seen with mass effect.  Right supraorbital scalp soft tissue swelling is evident.  Calvarium and skull base: The calvarium is intact. Skullbase is unremarkable.  Paranasal sinuses: Clear  Orbits: The globes and orbits are intact.  CTA NECK  Aortic arch: A 3 vessel arch configuration is present without significant atherosclerotic calcification or stenosis.  Right carotid system: The right common carotid artery is within normal limits. Mild atherosclerotic changes are noted at the right carotid bifurcation without significant stenosis. The more distal cervical right ICA is within normal limits.  Left carotid system: The left common carotid artery is within normal limits. Atherosclerotic changes are present at the bifurcation without a significant stenosis relative to the more distal vessel. The cervical left ICA is within normal limits.  Vertebral arteries:The vertebral arteries originate from the subclavian arteries bilaterally. The right vertebral artery is the dominant vessel. There are no significant stenoses or evidence for focal injury within the vertebral arteries of the neck.  Skeleton: Multilevel endplate degenerative change is present. Prominent endplate osteophyte formation and uncovertebral spurring is  most pronounced at C3-4 and C5-6. Multilevel endplate sclerotic changes are degenerative. No other focal lytic or blastic lesions are evident.  Other neck: Asymmetric left-sided thyroid goiter is again noted without significant interval change.  CTA HEAD  Anterior circulation: Minimal atherosclerotic calcifications are present within the cavernous internal carotid arteries bilaterally without significant stenosis. The A1 and M1 segments are within normal limits. The right  A1 segment is dominant. The anterior communicating artery is patent. The MCA bifurcations are intact. There is moderate attenuation of more distal MCA branch vessels bilaterally, particularly on the left. ACA branch vessels are displaced by the midline cyst.  Posterior circulation: The vertebral arteries are within normal limits bilaterally. PICA origins are visualized and normal. Both posterior cerebral arteries originate from the basilar tip. A prominent right posterior communicating artery contributes. Moderate attenuation of PCA branch vessels is evident bilaterally. There is a moderate focal right P2 segment stenosis of 50-70% relative to the more distal vessel.  Venous sinuses: The dural sinuses are patent. The right transverse sinus is dominant. The straight sinus and deep cerebral veins are patent. Cortical veins are intact.  Anatomic variants: None  Delayed phase: The postcontrast images demonstrate no pathologic enhancement.  IMPRESSION: 1. Moderate proximal right PCA stenosis corresponds with the right occipital lobe infarct. 2. No significant change in the size the right occipital lobe infarct or hemorrhage. 3. Remote encephalomalacia of the anterior left frontal lobe. 4. Midline cyst on the left with mass effect as described. 5. Mild diffuse small vessel disease evident on the MRA. 6. Multilevel degenerative changes throughout the cervical spine are most pronounced at C3-4 and C5-6. 7. Atherosclerotic changes within the carotid bifurcations bilaterally without significant stenosis.   Electronically Signed   By: Marin Roberts M.D.   On: 06/17/2015 09:48   Ct Cervical Spine Wo Contrast  06/15/2015   ADDENDUM REPORT: 06/15/2015 22:56  ADDENDUM: CT - CERVICAL SPINE WITHOUT CONTRAST:  The vertebral column, pedicles and facet articulations are intact. There is no evidence of acute fracture. No acute soft tissue abnormalities are evident.  There are moderately severe degenerative disc changes with  small posterior osteophytes at multiple levels. The thyroid goiter is again evident. It is incompletely imaged.  IMPRESSION: 1. Negative for acute cervical spine fracture 2. Incompletely imaged thyroid goiter. This was evaluated with ultrasonography on 12/11/2012.   Electronically Signed   By: Ellery Plunk M.D.   On: 06/15/2015 22:56   06/15/2015   CLINICAL DATA:  Larey Seat from wheelchair to carpeted floor. Abrasion above the right eye. Patient was seen yesterday for a different fall.  EXAM: CT HEAD WITHOUT CONTRAST  TECHNIQUE: Contiguous axial images were obtained from the base of the skull through the vertex without intravenous contrast.  COMPARISON:  06/14/2015, 07/04/2012.  FINDINGS: There is no intracranial hemorrhage. There is agenesis of the corpus callosum and a dorsal interhemispheric cyst extending to the left. There is new hypodensity in the medial right occipital lobe which likely represents cytotoxic edema associated with a subacute infarction. No other significant interval changes are evident. There is mild mass effect on the right occipital horn. There is no midline shift.  There is a right frontal scalp hematoma. Calvarium and skullbase are intact.  IMPRESSION: 1. Subacute nonhemorrhagic right occipital infarction. No midline shift. Mild mass effect on the right occipital horn. 2. Congenital agenesis of the corpus callosum with a dorsal interhemispheric cyst extending to the left. 3. Right frontal scalp hematoma. No evidence of acute intracranial traumatic injury.  Electronically Signed: By: Ellery Plunk M.D. On: 06/15/2015 21:21   [name]  06/16/2015   CLINICAL DATA:  Initial evaluation for acute confusion, altered mental status. Recent fall.  EXAM: MRI HEAD WITHOUT CONTRAST  TECHNIQUE: Multiplanar, multiecho pulse sequences of the brain and surrounding structures were obtained without intravenous contrast.  COMPARISON:  Prior CT from 06/15/2015 as well as previous MRI from  07/04/2012.  FINDINGS: Study is limited as the patient was unable to tolerate the full length of the exam. Only diffusion weighted sequences and sagittal T1 weighted sequence were performed.  Axial diffusion-weighted sequence demonstrates abnormal restricted diffusion involving the medial right occipital lobe, consistent with acute right PCA territory infarct. No significant mass effect.  Additional tiny 6 mm ischemic infarct within the left occipital lobe (series 3, image 12).  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 No other definite acute abnormality.  IMPRESSION: 1. Limited study with only diffusion-weighted sequences performed. 2. Acute ischemic right PCA territory infarct without significant mass effect. 3. Additional small 6 mm acute ischemic infarct within the left occipital lobe. 4. 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 5. Small right frontal scalp contusion.   Electronically Signed   By: Rise Mu M.D.   On: 06/16/2015 01:47    Scheduled Meds: .  stroke: mapping our early stages of recovery book   Does not apply Once  . aspirin  300 mg Rectal Daily   Or  . aspirin  325 mg Oral Daily  . heparin  5,000 Units Subcutaneous 3 times per day  . insulin aspart  0-9 Units Subcutaneous 6 times per day  . levETIRAcetam  500 mg Intravenous Q12H   Continuous Infusions: . sodium chloride 75 mL/hr at 06/17/15 0535    Time spent: 35 minutes  Kearie Mennen L  [facility] [email or web address], password [email or web address] 06/17/2015, 7:35 PM  LOS: 2 days

## 2015-06-17 NOTE — Evaluation (Signed)
Physical Therapy Evaluation Patient Details Name: Arthur Berry MRN: [id number] DOB: 25-May-1946 Today's Date: 06/17/2015   History of Present Illness  Patient is a 69 yo male admitted from [facility] (ALF) on 06/15/15 with vision changes and fall.  Per chart, patient has had multiple falls.  MRI:  Acute ischemic right PCA territory infarct and Additional small 6 mm acute ischemic infarct within the left occipital lobe.  PMH:  HTN, HLD, DM, seizures, CAD  Clinical Impression  Patient presents with problems listed below.  Will benefit from acute PT to maximize functional mobility prior to discharge.  Per SW note, patient returning to same facility. Recommend f/u PT at discharge.    Follow Up Recommendations Supervision/Assistance - 24 hour;Home health PT (If facility is ALF, then will need HHPT.)    Equipment Recommendations  None recommended by PT    Recommendations for Other Services       Precautions / Restrictions Precautions Precautions: Fall Precaution Comments: Per chart, patient had multiple falls.  Bruising noted on face.  Patient reports "none that I recall" when asked about any falls. Restrictions Weight Bearing Restrictions: No      Mobility  Bed Mobility Overal bed mobility: Needs Assistance Bed Mobility: Supine to Sit;Sit to Supine     Supine to sit: Min assist Sit to supine: Min assist   General bed mobility comments: Verbal cues for technique and to use hand rail.  Assist to scoot forward to EOB.  Good sitting balance once upright.  Assist to bring LE's onto bed to return to supine.  Transfers Overall transfer level: Needs assistance Equipment used: Rolling walker (2 wheeled) Transfers: Sit to/from Stand Sit to Stand: Mod assist         General transfer comment: Verbal cues for hand placement.  Assist to rise to standing and for balance.  Patient stood x 60 seconds.  Patient c/o feeling dizzy/faint.  Returned to sitting.  Ambulation/Gait              General Gait Details: Patient declined ambulation.  "Let's do one step at a time".  Stairs            Wheelchair Mobility    Modified Rankin (Stroke Patients Only) Modified Rankin (Stroke Patients Only) Pre-Morbid Rankin Score: Moderate disability Modified Rankin: Moderately severe disability     Balance Overall balance assessment: Needs assistance Sitting-balance support: No upper extremity supported;Feet supported Sitting balance-Leahy Scale: Fair     Standing balance support: Bilateral upper extremity supported Standing balance-Leahy Scale: Poor                               Pertinent Vitals/Pain Pain Assessment: No/denies pain    Home Living Family/patient expects to be discharged to:: Assisted living (Per SW note, patient returning to same facility)     Type of Home: Assisted living         Home Equipment: Walker - 2 wheels      Prior Function Level of Independence: Needs assistance   Gait / Transfers Assistance Needed: Per chart, using RW and w/c for mobility           Hand Dominance   Dominant Hand: Left    Extremity/Trunk Assessment   Upper Extremity Assessment: Defer to OT evaluation           Lower Extremity Assessment: Generalized weakness         Communication   Communication:  No difficulties (Lt visual field cut)  Cognition Arousal/Alertness: Awake/alert Behavior During Therapy: Flat affect Overall Cognitive Status: No family/caregiver present to determine baseline cognitive functioning       Memory: Decreased short-term memory              General Comments      Exercises        Assessment/Plan    PT Assessment Patient needs continued PT services  PT Diagnosis Difficulty walking;Generalized weakness;Altered mental status   PT Problem List Decreased strength;Decreased activity tolerance;Decreased balance;Decreased mobility;Decreased cognition;Decreased safety awareness;Decreased  knowledge of use of DME  PT Treatment Interventions DME instruction;Gait training;Functional mobility training;Therapeutic activities;Balance training;Cognitive remediation;Patient/family education   PT Goals (Current goals can be found in the Care Plan section) Acute Rehab PT Goals Patient Stated Goal: None stated PT Goal Formulation: With patient Time For Goal Achievement: 06/24/15 Potential to Achieve Goals: Fair    Frequency Min 3X/week   Barriers to discharge        Co-evaluation               End of Session Equipment Utilized During Treatment: Gait belt Activity Tolerance: Patient limited by fatigue Patient left: in bed;with call bell/phone within reach;with nursing/sitter in room Nurse Communication: Mobility status         Time: 1610-9604 PT Time Calculation (min) (ACUTE ONLY): 11 min   Charges:   PT Evaluation $Initial PT Evaluation Tier I: 1 Procedure     PT G CodesVena Austria 07-05-15, 8:34 PM Durenda Hurt. Renaldo Fiddler, [facility] Acute Rehab Services Pager [phone]

## 2015-06-17 NOTE — Progress Notes (Signed)
Pt was very confused and agitated around 2100 during  neuro assess and was uncooperative.was medicated with haldol 2 mg  prior to NIH SS., Noted an increase in NIH to 4. MD on call made aware. RN will redo NIH SS when pt fully. RN will continue to monitor pt's neuro status.

## 2015-06-17 NOTE — Evaluation (Signed)
Speech Language Pathology Evaluation Patient Details Name: Arthur Berry MRN: [id number] DOB: 09-Aug-1946 Today's Date: 06/17/2015 Time: 0800-0810 SLP Time Calculation (min) (ACUTE ONLY): 10 min  Problem List:  Patient Active Problem List   Diagnosis Date Noted  . Stroke 06/16/2015  . Fall 06/16/2015  . Epilepsy 06/16/2015  . BPH (benign prostatic hyperplasia) 06/16/2015  . Leukocytosis 06/16/2015  . CVA (cerebral infarction) 06/16/2015  . CVA (cerebral vascular accident) 06/15/2015  . GERD (gastroesophageal reflux disease) 04/23/2014  . Potassium (K) deficiency 12/11/2012  . Precordial pain 12/11/2012  . Dehydration 04/15/2012  . AKI (acute kidney injury) 04/14/2012  . HLD (hyperlipidemia) 06/01/2010  . Essential hypertension, benign 06/01/2010  . DYSPNEA 06/01/2010  . Diabetes mellitus without complication 11/04/2009  . RECTAL BLEEDING 11/04/2009  . PERSONAL HX COLONIC POLYPS 11/04/2009   Past Medical History:  Past Medical History  Diagnosis Date  . Colon polyp 2005    Tubular Adenoma  . Hypertension 1999  . Arthritis   . Hyperlipidemia 1999  . Epilepsy   . Sleep apnea   . Chronic kidney disease   . Coronary artery disease, non-occlusive     Minimal nonobstructive CAD, nl LV systolic fxn by cath 12/11/12  . Goiter, nontoxic, multinodular     Thyroid US 11/2012  . Diabetes mellitus 2001    TYPE 2  . Neuromuscular disorder   . Splinter 04/27/13    metal plinter removed rt pointer finger  . GERD (gastroesophageal reflux disease) 04/23/2014  . Diverticulosis   . Internal hemorrhoids    Past Surgical History:  Past Surgical History  Procedure Laterality Date  . Knee surgery  2008    Left  . Cholecystectomy    . Inguinal hernia repair  06/27/11    left  . Esophagogastroduodenoscopy  10/14/2012    normal   . Colonoscopy  12/28/2009  . Left heart catheterization with coronary angiogram N/A 12/11/2012    Procedure: LEFT HEART CATHETERIZATION WITH CORONARY ANGIOGRAM;   Surgeon: Tonny Bollman, MD;  Location: [facility] CATH LAB;  Service: Cardiovascular;  Laterality: N/A;   HPI:  69 yo male resident of [facility] presents to [facility] after vision changes and fall.  Pt has been at his facility since 7/2 per neurology note.   CT head showed subacute nonhemmorhagic right occipital lobe CVA.  Pt unable to tolerate MRI yesterday due to agitation.  PMH + for GERD, goiter, CAD, fall, epilepsy, BPH.       Assessment / Plan / Recommendation Clinical Impression  Uncertain to pt's baseline function as no family is present, however note pt resides in a retirement community and has h/o seizure disorder.  Pt's speech was clear with no indications of dysarthria.  Pt currently presents with cognitive-linguistic defictis impacting memory, awareness to deficits, as well as problem solving.   Pt was oriented to self only today= chose correct year from choice of 2.  Observed occasional word finding difficulty during testing, he reports this to be present since he was "knee high to a grasshopper".  Pt appears to compensate/mitigate deficits by using evasive language eg: "I need to investigate to locate my nurse".  Educated pt to need to use call bell for assistance due to fall risk, despite his statement that he could get up on his own.  Pt denies vision changes demonstrating decreased awareness, however after planned failure, pt verbalized change. Will follow up to determine cog-ling baseline and help maximize pt's rehab to mitigate caregiver burden.  SLP Assessment  Patient needs continued Speech Lanaguage Pathology Services    Follow Up Recommendations   (tba)    Frequency and Duration min 1 x/week  1 week   Pertinent Vitals/Pain Pain Assessment: No/denies pain   SLP Goals  Progression toward goals: Progressing toward goals Patient/Family Stated Goal: none stated Potential to Achieve Goals (ACUTE ONLY): Fair Potential Considerations (ACUTE ONLY): Ability to  learn/carryover information;Previous level of function  SLP Evaluation Prior Functioning  Cognitive/Linguistic Baseline: Information not available (? baseline cognitive deficits) Type of Home:  [facility]) Education: 8th grade education per pt   Cognition  Overall Cognitive Status: No family/caregiver present to determine baseline cognitive functioning Arousal/Alertness: Awake/alert Orientation Level: Oriented to person;Disoriented to place;Disoriented to time;Disoriented to situation Attention: Sustained Sustained Attention: Appears intact Memory: Impaired Memory Impairment:  (did not recall name of place pt resides nor possible medical diagnosis) Awareness: Impaired Awareness Impairment: Intellectual impairment (decreased awareness to vision deficits *locating items within right lateral visual field was difficult for pt- after cued, pt verbalized changes) Problem Solving: Impaired (need to locate calla bell for assistance if nurse or sitter not present) Safety/Judgment: Impaired (pt state able to get up on own - after review, admitted to need to call to prevent fall)    Comprehension  Auditory Comprehension Overall Auditory Comprehension: Appears within functional limits for tasks assessed Commands: Within Functional Limits Conversation: Complex Visual Recognition/Discrimination Discrimination: Not tested Reading Comprehension Reading Status:  (pt read clock adequately, unable to read "speech" but read therapy)    Expression Expression Primary Mode of Expression: Verbal Verbal Expression Overall Verbal Expression: Other (comment) Initiation: No impairment Level of Generative/Spontaneous Verbalization: Sentence Repetition: No impairment Level of Impairment: Sentence level Naming: Not tested Pragmatics: No impairment Other Verbal Expression Comments: pt with occasional word finding difficulty during testing, he reports this to be present since he was "knee  high to a grasshopper", pt appears to compensate by using evasive language eg: "I need to investigate to locate my nurse" Written Expression Dominant Hand: Left Written Expression: Not tested   Oral / Motor Oral Motor/Sensory Function Overall Oral Motor/Sensory Function: Appears within functional limits for tasks assessed Motor Speech Overall Motor Speech: Appears within functional limits for tasks assessed Respiration: Within functional limits Resonance: Within functional limits Articulation: Within functional limitis Intelligibility: Intelligible Motor Planning: Witnin functional limits Motor Speech Errors: Not applicable   GO     Donavan Burnet, MS [facility] SLP [phone]

## 2015-06-17 NOTE — Progress Notes (Signed)
*  PRELIMINARY RESULTS* Vascular Ultrasound Lower extremity venous duplex has been completed.  Preliminary findings: negative for DVT.   Farrel Demark, RDMS, RVT  06/17/2015, 11:15 AM

## 2015-06-17 NOTE — Progress Notes (Signed)
Critical lab value received from the laboratory.  Dilantin 36.2.  MD Lendell Caprice made aware. Sondra Come, RN

## 2015-06-18 DIAGNOSIS — T420X1A Poisoning by hydantoin derivatives, accidental (unintentional), initial encounter: Secondary | ICD-10-CM | POA: Diagnosis present

## 2015-06-18 LAB — GLUCOSE, CAPILLARY
GLUCOSE-CAPILLARY: 102 mg/dL — AB (ref 65–99)
GLUCOSE-CAPILLARY: 137 mg/dL — AB (ref 65–99)
GLUCOSE-CAPILLARY: 132 mg/dL — AB (ref 65–99)
Glucose-Capillary: 99 mg/dL (ref 65–99)
Glucose-Capillary: 116 mg/dL — ABNORMAL HIGH (ref 65–99)

## 2015-06-18 LAB — ALBUMIN: ALBUMIN: 3.6 g/dL (ref 3.5–5.0)

## 2015-06-18 LAB — LEVETIRACETAM LEVEL: Levetiracetam Lvl: 5.3 ug/mL — ABNORMAL LOW (ref 10.0–40.0)

## 2015-06-18 LAB — PHENYTOIN LEVEL, TOTAL: PHENYTOIN LVL: 34.7 ug/mL — AB (ref 10.0–20.0)

## 2015-06-18 MED ORDER — ESCITALOPRAM OXALATE 10 MG PO TABS
5.0000 mg | ORAL_TABLET | Freq: Every day | ORAL | Status: DC
Start: 2015-06-18 — End: 2015-06-22
  Administered 2015-06-18 – 2015-06-22 (×5): 5 mg via ORAL
  Filled 2015-06-18 (×5): qty 1

## 2015-06-18 MED ORDER — POTASSIUM CHLORIDE CRYS ER 20 MEQ PO TBCR
40.0000 meq | EXTENDED_RELEASE_TABLET | Freq: Once | ORAL | Status: AC
  Administered 2015-06-18: 40 meq via ORAL
  Filled 2015-06-18: qty 2

## 2015-06-18 MED ORDER — POTASSIUM CHLORIDE CRYS ER 20 MEQ PO TBCR
20.0000 meq | EXTENDED_RELEASE_TABLET | Freq: Every day | ORAL | Status: DC
  Administered 2015-06-19 – 2015-06-22 (×4): 20 meq via ORAL
  Filled 2015-06-18 (×4): qty 1

## 2015-06-18 MED ORDER — TAMSULOSIN HCL 0.4 MG PO CAPS
0.4000 mg | ORAL_CAPSULE | Freq: Every day | ORAL | Status: DC
  Administered 2015-06-18 – 2015-06-21 (×4): 0.4 mg via ORAL
  Filled 2015-06-18 (×4): qty 1

## 2015-06-18 MED ORDER — ATORVASTATIN CALCIUM 40 MG PO TABS
40.0000 mg | ORAL_TABLET | Freq: Every day | ORAL | Status: DC
  Administered 2015-06-18 – 2015-06-22 (×5): 40 mg via ORAL
  Filled 2015-06-18 (×5): qty 1

## 2015-06-18 MED ORDER — PANTOPRAZOLE SODIUM 40 MG PO TBEC
40.0000 mg | DELAYED_RELEASE_TABLET | Freq: Every day | ORAL | Status: DC
  Administered 2015-06-18 – 2015-06-22 (×5): 40 mg via ORAL
  Filled 2015-06-18 (×5): qty 1

## 2015-06-18 MED ORDER — LEVETIRACETAM 500 MG PO TABS
500.0000 mg | ORAL_TABLET | Freq: Two times a day (BID) | ORAL | Status: DC
Start: 2015-06-18 — End: 2015-06-22
  Administered 2015-06-18 – 2015-06-22 (×8): 500 mg via ORAL
  Filled 2015-06-18 (×8): qty 1

## 2015-06-18 NOTE — Evaluation (Signed)
Occupational Therapy Evaluation Patient Details Name: Arthur Berry MRN: [id number] DOB: 1946/07/07 Today's Date: 06/18/2015    History of Present Illness Patient is a 69 yo male admitted from [facility] (ALF) on 06/15/15 with vision changes and fall.  Per chart, patient has had multiple falls.  MRI:  Acute ischemic right PCA territory infarct and Additional small 6 mm acute ischemic infarct within the left occipital lobe.  PMH:  HTN, HLD, DM, seizures, CAD   Clinical Impression    Pt admitted with above.  Will benefit from OT services acutely to address Lt visual impairments and their effects on his ability to complete self-care tasks and maximize functional mobility. Pt required min assist with sit > stand and ambulation and transfers with RW with verbal cues to scan to Lt. Per SW note, pt to return to same facility.  Recommend follow up OT at discharge.    Follow Up Recommendations  Home health OT;Supervision/Assistance - 24 hour (Plan is to return to ALF, recommend home health OT follow up)    Equipment Recommendations  None recommended by OT    Recommendations for Other Services       Precautions / Restrictions Precautions Precautions: Fall Precaution Comments: Per chart, patient had multiple falls.  Bruising noted on face.   Restrictions Weight Bearing Restrictions: No      Mobility Bed Mobility               General bed mobility comments: In chair  Transfers Overall transfer level: Needs assistance Equipment used: Rolling walker (2 wheeled) Transfers: Sit to/from [employer] Sit to Stand: Min assist Stand pivot transfers: Min assist       General transfer comment: Min assist for boost to stand from recliner, leaning posteriorly. Using back of knees on chair for support. VC for hand placement, to scoot to edge of chair, and weight shift anteriorly to rise.         ADL Overall ADL's : Needs assistance/impaired      Grooming: Wash/dry hands;Oral care;Min guard;Cueing for sequencing Grooming Details (indicate cue type and reason): Cues to scan to Lt to locate items                 Toilet Transfer: Minimal assistance;Ambulation;Comfort height toilet;Grab bars;RW           Functional mobility during ADLs: Minimal assistance;Cueing for safety;Cueing for sequencing;Rolling walker General ADL Comments: Verbal cues for visual attention and scanning to Lt with grooming, mobility and transfers     Vision Vision Assessment?: Yes Eye Alignment: Impaired (comment) Ocular Range of Motion: Impaired-to be further tested in functional context Alignment/Gaze Preference: Head turned;Gaze right Tracking/Visual Pursuits: Decreased smoothness of horizontal tracking;Requires cues, head turns, or add eye shifts to track;Unable to hold eye position out of midline Saccades: Undershoots;Decreased speed of saccadic movement Visual Fields: Left visual field deficit Depth Perception: Air cabin crew Tested?: Yes Perception Deficits: Inattention/neglect Inattention/Neglect: Does not attend to left visual field       Pertinent Vitals/Pain Pain Assessment: No/denies pain     Hand Dominance Left   Extremity/Trunk Assessment Upper Extremity Assessment Upper Extremity Assessment: Generalized weakness (limited shoulder ROM approx 140 degrees)   Lower Extremity Assessment Lower Extremity Assessment: Generalized weakness       Communication Communication Communication: No difficulties   Cognition Arousal/Alertness: Awake/alert Behavior During Therapy: Flat affect Overall Cognitive Status: Impaired/Different from baseline Area of Impairment: Orientation;Memory;Awareness Orientation Level: Disoriented to;Time (required increased time to  recall DOB, sister reports yesterday only able to recall year of birth)   Memory: Decreased short-term memory (Pt with difficulty recalling  setup of room at ALF and required increased time to recall DOB)     Awareness: Intellectual                  Home Living Family/patient expects to be discharged to:: Assisted living (Per SW note, pt to return to same facility)     Type of Home: Assisted living                       Home Equipment: Walker - 2 wheels   Additional Comments: pt's sister present and reports elevated toilet seats and grab bars in bathroom, communal typ bathroom with walk-in shower and walk-in tub both with seats      Prior Functioning/Environment Level of Independence: Needs assistance  Gait / Transfers Assistance Needed: Per chart, using RW for mobility ADL's / Homemaking Assistance Needed: Mod I with self-care tasks, facility prepared all meals and family assisted with laundry and meals        OT Diagnosis: Generalized weakness;Cognitive deficits;Disturbance of vision   OT Problem List: Decreased strength;Decreased activity tolerance;Impaired balance (sitting and/or standing);Impaired vision/perception;Decreased coordination;Decreased cognition;Decreased safety awareness;Decreased knowledge of use of DME or AE   OT Treatment/Interventions: Self-care/ADL training;Neuromuscular education;DME and/or AE instruction;Therapeutic activities;Cognitive remediation/compensation;Visual/perceptual remediation/compensation;Patient/family education;Balance training    OT Goals(Current goals can be found in the care plan section) Acute Rehab OT Goals Patient Stated Goal: None stated  OT Frequency: Min 3X/week    End of Session Equipment Utilized During Treatment: Gait belt;Rolling walker  Activity Tolerance: Patient tolerated treatment well;No increased pain Patient left: in chair;with call bell/phone within reach;with family/visitor present   Time: 1610-9604 OT Time Calculation (min): 39 min Charges:  OT General Charges $OT Visit: 1 Procedure OT Evaluation $Initial OT Evaluation Tier I: 1  Procedure OT Treatments $Self Care/Home Management : 23-37 mins G-CodesRosalio Loud, [phone] 06/18/2015, 2:35 PM
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
## 2015-06-18 NOTE — Progress Notes (Signed)
Physical Therapy Treatment Patient Details Name: Arthur Berry MRN: [id number] DOB: 1946/08/07 Today's Date: 06/18/2015    History of Present Illness Patient is a 70 yo male admitted from [facility] (ALF) on 06/15/15 with vision changes and fall.  Per chart, patient has had multiple falls.  MRI:  Acute ischemic right PCA territory infarct and Additional small 6 mm acute ischemic infarct within the left occipital lobe.  PMH:  HTN, HLD, DM, seizures, CAD    PT Comments    Progressing towards physical therapy goals. Focused on gait training with a rolling walker, required frequent cues for awareness of drifting to Lt and Rt while avoiding objects in hallway. No overt balance loss or buckling noted. Motivated to work with therapy. Poor ability to scan environment without cues. Patient will continue to benefit from skilled physical therapy services to further improve independence with functional mobility.   Follow Up Recommendations  Supervision/Assistance - 24 hour;Home health PT (If facility is ALF, then will need HHPT.)     Equipment Recommendations  None recommended by PT    Recommendations for Other Services       Precautions / Restrictions Precautions Precautions: Fall Precaution Comments: Per chart, patient had multiple falls.  Bruising noted on face.  Patient reports "none that I recall" when asked about any falls. Restrictions Weight Bearing Restrictions: No    Mobility  Bed Mobility               General bed mobility comments: In chair  Transfers Overall transfer level: Needs assistance Equipment used: Rolling walker (2 wheeled) Transfers: Sit to/from Stand Sit to Stand: Min assist         General transfer comment: Min assist for boost to stand from recliner, leaning posteriorly. Using back of knees on chair for support. VC for hand placement, to scoot to edge of chair, and weight shift anteriorly to rise.  Ambulation/Gait Ambulation/Gait  assistance: Min assist;+2 safety/equipment Ambulation Distance (Feet): 85 Feet (x2) Assistive device: Rolling walker (2 wheeled) Gait Pattern/deviations: Step-through pattern;Decreased stride length;Drifts right/left;Trunk flexed Gait velocity: decreased   General Gait Details: VC for walker placement for proximity and max cues for awareness of drift which is noted to left and right. No buckling or loss of balance noted. Min assist for walker control at times to correct drift. +2 for chair follow   Stairs            Wheelchair Mobility    Modified Rankin (Stroke Patients Only) Modified Rankin (Stroke Patients Only) Pre-Morbid Rankin Score: Moderate disability Modified Rankin: Moderately severe disability     Balance                                    Cognition Arousal/Alertness: Awake/alert Behavior During Therapy: Flat affect Overall Cognitive Status: No family/caregiver present to determine baseline cognitive functioning       Memory: Decreased short-term memory (decreased long-term. Unsure of month and year. knows locatio)              Exercises      General Comments General comments (skin integrity, edema, etc.): Able to read objects on wall when cued but needs to stop ambulating to turn head and locate objects.      Pertinent Vitals/Pain Pain Assessment: No/denies pain    Home Living  Prior Function            PT Goals (current goals can now be found in the care plan section) Acute Rehab PT Goals Patient Stated Goal: None stated PT Goal Formulation: With patient Time For Goal Achievement: 06/24/15 Potential to Achieve Goals: Fair Progress towards PT goals: Progressing toward goals    Frequency  Min 3X/week    PT Plan Current plan remains appropriate    Co-evaluation             End of Session Equipment Utilized During Treatment: Gait belt Activity Tolerance: Patient tolerated treatment  well Patient left: with call bell/phone within reach;with nursing/sitter in room;in chair     Time: 7829-5621 PT Time Calculation (min) (ACUTE ONLY): 16 min  Charges:  $Gait Training: 8-22 mins                    G Codes:      Berton Mount 2015/07/12, 11:38 AM Charlsie Merles, PT [phone]

## 2015-06-18 NOTE — Clinical Social Work Note (Signed)
CSW reviewed chart and spoke with MD. Once pt is medically stable he can return back to [facility]. CSW will continue to follow and assist.   Kennieth Plotts, MSW, LCSWA [phone]

## 2015-06-18 NOTE — Care Management Note (Signed)
Case Management Note  Patient Details  Name: AIRRION OTTING MRN: [id number] Date of Birth: 1946/02/10  Subjective/Objective:    69 y.o. Male who was admitted 06/15/15 with blurred vision and dizziness after falling and hitting head at the [facility] where he had lived since 05/2014. Positive for Non-hemorhagic Stroke. PT/OT are both recommending home therapies at this time. May require higher level of care, disposition is pending. Will continue to follow.           Action/Plan:   Expected Discharge Date:                  Expected Discharge Plan:  Home w Home Health Services [facility])  In-House Referral:  Clinical Social Work  Discharge planning Services  CM Consult  Post Acute Care Choice:    Choice offered to:     DME Arranged:    DME Agency:     HH Arranged:    HH Agency:     Status of Service:  In process, will continue to follow  Medicare Important Message Given:  Yes-second notification given Date Medicare IM Given:    Medicare IM give by:    Date Additional Medicare IM Given:    Additional Medicare Important Message give by:     If discussed at Long Length of Stay Meetings, dates discussed:    Additional Comments:  Yvone Neu, RN 06/18/2015, 5:22 PM

## 2015-06-18 NOTE — Progress Notes (Signed)
[facility] PROGRESS NOTE  Arthur Berry:[id number] DOB: 04-Jan-1946 DOA: 06/15/2015 PCP: [name], MD  Summary 69 y.o. male with Hx of hypertension, hyperlipidemia, diabetes mellitus, GERD, seizure, CAD, goiter, BPH, who presented with near syncope and a fall.  In ED, patient was found to have negative troponin, negative urinalysis, WBC 11.0, temperature normal, no tachycardia, electrolytes okay. CT C-spine was negative for bony fracture. CT head showed subacute nonhemorrhagic right occipital infarction. No midline shift. Mild mass effect on the right occipital horn. Congenital agenesis of the corpus callosum with a dorsal interhemispheric cyst extending to the left. Right frontal scalp hematoma. No evidence of acute intracranial traumatic injury. Initial dilantin level was >50.   Assessment/Plan:  Principal Problem:    Stroke workup echo without source of embolus.CTA head and neck - Moderate proximal right PCA stenosis corresponds with the right occipital lobe infarct.  EEG - pending  Lower extremity venous ultrasound - negative for DVT.  LDL 51  HgbA1c 7.0  No antithrombotics prior to admission as per pt, now on aspirin 325 mg orally every day. Continue ASA on discharge.  Recommend 30 day cardiac event monitoring to rule out afib given bilateral involvement. The patient is not felt to be an anticoagulation candidate. To ALF with PT/OT when dilantin level within therapeutic range.  Dilantin toxicity: still high. Held since admission.    Diabetes mellitus without complication   HLD (hyperlipidemia)   Essential hypertension, benign   Potassium (K) deficiency   GERD (gastroesophageal reflux disease)   Fall secondary to dilatntin toxicity   Epilepsy   BPH (benign prostatic hyperplasia) Delirium improving  D/w sister HPI/Subjective: No complaints  Objective: Filed Vitals:   06/18/15 1320  BP: 126/64  Pulse: 69  Temp: 98.3 F (36.8 C)  Resp: 18     Intake/Output Summary (Last 24 hours) at 06/18/15 1814 Last data filed at 06/18/15 0901  Gross per 24 hour  Intake      0 ml  Output    775 ml  Net   -775 ml   There were no vitals filed for this visit.  Exam:   General:  Disoriented to time.  Heent: right frontal contusion  Cardiovascular: RRR without mgr  Respiratory: CTA without WRR  Abdomen: S, nt, nd  Ext: no CCE  Neurology left hemianopsia  Basic Metabolic Panel:  Recent Labs Lab 06/14/15 1622 06/15/15 2130 06/17/15 0749  NA 141 141 138  K 4.3 4.1 3.5  CL 105 101 99*  CO2 GLUCOSE 85 133* 114*  BUN 24* 20 12  CREATININE 1.18 1.14 0.99  CALCIUM 8.9 9.4 8.5*   Liver Function Tests:  Recent Labs Lab 06/16/15 0212 06/18/15 1124  AST 27  --   ALT 17  --   ALKPHOS 125  --   BILITOT 0.7  --   PROT 6.9  --   ALBUMIN 3.8 3.6   No results for input(s): LIPASE, AMYLASE in the last 168 hours. No results for input(s): AMMONIA in the last 168 hours. CBC:  Recent Labs Lab 06/14/15 1622 06/15/15 2130 06/17/15 0749  WBC 10.7* 11.0* 9.2  NEUTROABS 6.9 7.3  --   HGB 12.5* 12.8* 12.4*  HCT 36.8* 36.5* 35.1*  MCV 89.3 88.0 88.0  PLT 147* 141* 139*   Cardiac Enzymes:  Recent Labs Lab 06/15/15 2130  TROPONINI <0.03   BNP (last 3 results) No results for input(s): BNP in the last 8760 hours.  ProBNP (last 3 results)  No results for input(s): PROBNP in the last 8760 hours.  CBG:  Recent Labs Lab 06/17/15 2211 06/18/15 0619 06/18/15 0632 06/18/15 1127 06/18/15 1621  GLUCAP 128* 102* 99 137* 132*    No results found for this or any previous visit (from the past 240 hour(s)).   Studies: Ct Angio Head W/cm &/or Wo Cm  06/17/2015   CLINICAL DATA:  Recent fall.  Visual changes.  EXAM: CT ANGIOGRAPHY HEAD AND NECK  TECHNIQUE: Multidetector CT imaging of the head and neck was performed using the standard protocol during bolus administration of intravenous contrast. Multiplanar CT  image reconstructions and MIPs were obtained to evaluate the vascular anatomy. Carotid stenosis measurements (when applicable) are obtained utilizing NASCET criteria, using the distal internal carotid diameter as the denominator.  CONTRAST:  50mL OMNIPAQUE IOHEXOL 350 MG/ML SOLN  COMPARISON:  MRI brain 06/16/2015. CT head without contrast 06/15/2015.  FINDINGS: CT HEAD  Brain: The medial right occipital lobe infarct is again noted. There is no evidence for hemorrhage. Left ACA territory encephalomalacia is stable. A prominent left hemispheric cyst is again seen with mass effect.  Right supraorbital scalp soft tissue swelling is evident.  Calvarium and skull base: The calvarium is intact. Skullbase is unremarkable.  Paranasal sinuses: Clear  Orbits: The globes and orbits are intact.  CTA NECK  Aortic arch: A 3 vessel arch configuration is present without significant atherosclerotic calcification or stenosis.  Right carotid system: The right common carotid artery is within normal limits. Mild atherosclerotic changes are noted at the right carotid bifurcation without significant stenosis. The more distal cervical right ICA is within normal limits.  Left carotid system: The left common carotid artery is within normal limits. Atherosclerotic changes are present at the bifurcation without a significant stenosis relative to the more distal vessel. The cervical left ICA is within normal limits.  Vertebral arteries:The vertebral arteries originate from the subclavian arteries bilaterally. The right vertebral artery is the dominant vessel. There are no significant stenoses or evidence for focal injury within the vertebral arteries of the neck.  Skeleton: Multilevel endplate degenerative change is present. Prominent endplate osteophyte formation and uncovertebral spurring is most pronounced at C3-4 and C5-6. Multilevel endplate sclerotic changes are degenerative. No other focal lytic or blastic lesions are evident.  Other  neck: Asymmetric left-sided thyroid goiter is again noted without significant interval change.  CTA HEAD  Anterior circulation: Minimal atherosclerotic calcifications are present within the cavernous internal carotid arteries bilaterally without significant stenosis. The A1 and M1 segments are within normal limits. The right A1 segment is dominant. The anterior communicating artery is patent. The MCA bifurcations are intact. There is moderate attenuation of more distal MCA branch vessels bilaterally, particularly on the left. ACA branch vessels are displaced by the midline cyst.  Posterior circulation: The vertebral arteries are within normal limits bilaterally. PICA origins are visualized and normal. Both posterior cerebral arteries originate from the basilar tip. A prominent right posterior communicating artery contributes. Moderate attenuation of PCA branch vessels is evident bilaterally. There is a moderate focal right P2 segment stenosis of 50-70% relative to the more distal vessel.  Venous sinuses: The dural sinuses are patent. The right transverse sinus is dominant. The straight sinus and deep cerebral veins are patent. Cortical veins are intact.  Anatomic variants: None  Delayed phase: The postcontrast images demonstrate no pathologic enhancement.  IMPRESSION: 1. Moderate proximal right PCA stenosis corresponds with the right occipital lobe infarct. 2. No significant change in the size the  right occipital lobe infarct or hemorrhage. 3. Remote encephalomalacia of the anterior left frontal lobe. 4. Midline cyst on the left with mass effect as described. 5. Mild diffuse small vessel disease evident on the MRA. 6. Multilevel degenerative changes throughout the cervical spine are most pronounced at C3-4 and C5-6. 7. Atherosclerotic changes within the carotid bifurcations bilaterally without significant stenosis.   Electronically Signed   By: Marin Roberts M.D.   On: 06/17/2015 09:48   Ct Angio Neck W/cm  &/or Wo/cm  06/17/2015   CLINICAL DATA:  Recent fall.  Visual changes.  EXAM: CT ANGIOGRAPHY HEAD AND NECK  TECHNIQUE: Multidetector CT imaging of the head and neck was performed using the standard protocol during bolus administration of intravenous contrast. Multiplanar CT image reconstructions and MIPs were obtained to evaluate the vascular anatomy. Carotid stenosis measurements (when applicable) are obtained utilizing NASCET criteria, using the distal internal carotid diameter as the denominator.  CONTRAST:  50mL OMNIPAQUE IOHEXOL 350 MG/ML SOLN  COMPARISON:  MRI brain 06/16/2015. CT head without contrast 06/15/2015.  FINDINGS: CT HEAD  Brain: The medial right occipital lobe infarct is again noted. There is no evidence for hemorrhage. Left ACA territory encephalomalacia is stable. A prominent left hemispheric cyst is again seen with mass effect.  Right supraorbital scalp soft tissue swelling is evident.  Calvarium and skull base: The calvarium is intact. Skullbase is unremarkable.  Paranasal sinuses: Clear  Orbits: The globes and orbits are intact.  CTA NECK  Aortic arch: A 3 vessel arch configuration is present without significant atherosclerotic calcification or stenosis.  Right carotid system: The right common carotid artery is within normal limits. Mild atherosclerotic changes are noted at the right carotid bifurcation without significant stenosis. The more distal cervical right ICA is within normal limits.  Left carotid system: The left common carotid artery is within normal limits. Atherosclerotic changes are present at the bifurcation without a significant stenosis relative to the more distal vessel. The cervical left ICA is within normal limits.  Vertebral arteries:The vertebral arteries originate from the subclavian arteries bilaterally. The right vertebral artery is the dominant vessel. There are no significant stenoses or evidence for focal injury within the vertebral arteries of the neck.  Skeleton:  Multilevel endplate degenerative change is present. Prominent endplate osteophyte formation and uncovertebral spurring is most pronounced at C3-4 and C5-6. Multilevel endplate sclerotic changes are degenerative. No other focal lytic or blastic lesions are evident.  Other neck: Asymmetric left-sided thyroid goiter is again noted without significant interval change.  CTA HEAD  Anterior circulation: Minimal atherosclerotic calcifications are present within the cavernous internal carotid arteries bilaterally without significant stenosis. The A1 and M1 segments are within normal limits. The right A1 segment is dominant. The anterior communicating artery is patent. The MCA bifurcations are intact. There is moderate attenuation of more distal MCA branch vessels bilaterally, particularly on the left. ACA branch vessels are displaced by the midline cyst.  Posterior circulation: The vertebral arteries are within normal limits bilaterally. PICA origins are visualized and normal. Both posterior cerebral arteries originate from the basilar tip. A prominent right posterior communicating artery contributes. Moderate attenuation of PCA branch vessels is evident bilaterally. There is a moderate focal right P2 segment stenosis of 50-70% relative to the more distal vessel.  Venous sinuses: The dural sinuses are patent. The right transverse sinus is dominant. The straight sinus and deep cerebral veins are patent. Cortical veins are intact.  Anatomic variants: None  Delayed phase: The postcontrast images demonstrate  no pathologic enhancement.  IMPRESSION: 1. Moderate proximal right PCA stenosis corresponds with the right occipital lobe infarct. 2. No significant change in the size the right occipital lobe infarct or hemorrhage. 3. Remote encephalomalacia of the anterior left frontal lobe. 4. Midline cyst on the left with mass effect as described. 5. Mild diffuse small vessel disease evident on the MRA. 6. Multilevel degenerative changes  throughout the cervical spine are most pronounced at C3-4 and C5-6. 7. Atherosclerotic changes within the carotid bifurcations bilaterally without significant stenosis.   Electronically Signed   By: Marin Roberts M.D.   On: 06/17/2015 09:48   Echo 2D echo - - Left ventricle: The cavity size was normal. There was moderate concentric hypertrophy. Systolic function was normal. The estimated ejection fraction was in the range of 60% to 65%. Wall motion was normal; there were no regional wall motion abnormalities. Doppler parameters are consistent with abnormal left ventricular relaxation (grade 1 diastolic dysfunction).   Scheduled Meds: .  stroke: mapping our early stages of recovery book   Does not apply Once  . aspirin  300 mg Rectal Daily   Or  . aspirin  325 mg Oral Daily  . atorvastatin  40 mg Oral Daily  . escitalopram  5 mg Oral Daily  . heparin  5,000 Units Subcutaneous 3 times per day  . insulin aspart  0-5 Units Subcutaneous QHS  . insulin aspart  0-9 Units Subcutaneous TID WC  . levETIRAcetam  500 mg Oral BID  . pantoprazole  40 mg Oral Daily  . [START ON 06/19/2015] potassium chloride SA  20 mEq Oral Daily  . tamsulosin  0.4 mg Oral QPC supper   Continuous Infusions: . sodium chloride 75 mL/hr at 06/18/15 0305    Time spent: 25 minutes  Vaughn Frieze L  [facility] [email or web address], password [facility] 06/18/2015, 6:14 PM  LOS: 3 days

## 2015-06-19 ENCOUNTER — Other Ambulatory Visit: Payer: Self-pay | Admitting: Cardiology

## 2015-06-19 DIAGNOSIS — T420X1D Poisoning by hydantoin derivatives, accidental (unintentional), subsequent encounter: Secondary | ICD-10-CM

## 2015-06-19 DIAGNOSIS — E1159 Type 2 diabetes mellitus with other circulatory complications: Secondary | ICD-10-CM

## 2015-06-19 DIAGNOSIS — I10 Essential (primary) hypertension: Secondary | ICD-10-CM

## 2015-06-19 DIAGNOSIS — I639 Cerebral infarction, unspecified: Secondary | ICD-10-CM | POA: Insufficient documentation

## 2015-06-19 DIAGNOSIS — D696 Thrombocytopenia, unspecified: Secondary | ICD-10-CM

## 2015-06-19 LAB — GLUCOSE, CAPILLARY
GLUCOSE-CAPILLARY: 106 mg/dL — AB (ref 65–99)
GLUCOSE-CAPILLARY: 170 mg/dL — AB (ref 65–99)
Glucose-Capillary: 139 mg/dL — ABNORMAL HIGH (ref 65–99)
Glucose-Capillary: 99 mg/dL (ref 65–99)

## 2015-06-19 LAB — PHENYTOIN LEVEL, TOTAL: PHENYTOIN LVL: 31.6 ug/mL — AB (ref 10.0–20.0)

## 2015-06-19 MED ORDER — CARVEDILOL 3.125 MG PO TABS
3.1250 mg | ORAL_TABLET | Freq: Two times a day (BID) | ORAL | Status: DC
  Administered 2015-06-19 – 2015-06-22 (×6): 3.125 mg via ORAL
  Filled 2015-06-19 (×6): qty 1

## 2015-06-19 MED ORDER — AMLODIPINE BESYLATE 5 MG PO TABS
5.0000 mg | ORAL_TABLET | Freq: Every day | ORAL | Status: DC
  Administered 2015-06-19 – 2015-06-22 (×4): 5 mg via ORAL
  Filled 2015-06-19 (×4): qty 1

## 2015-06-19 MED ORDER — DOCUSATE SODIUM 100 MG PO CAPS
100.0000 mg | ORAL_CAPSULE | Freq: Two times a day (BID) | ORAL | Status: DC
  Administered 2015-06-19 – 2015-06-22 (×6): 100 mg via ORAL
  Filled 2015-06-19 (×6): qty 1

## 2015-06-19 MED ORDER — SENNA 8.6 MG PO TABS
2.0000 | ORAL_TABLET | Freq: Every day | ORAL | Status: DC
  Administered 2015-06-19 – 2015-06-22 (×4): 17.2 mg via ORAL
  Filled 2015-06-19 (×4): qty 2

## 2015-06-19 NOTE — Discharge Instructions (Signed)
You will have a monitor placed for 30 days, Dr. Jenene Slicker office will call to give you date and time. And be placed at our [address] office.

## 2015-06-19 NOTE — Progress Notes (Addendum)
PROGRESS NOTE  Arthur Berry:[id number] DOB: 16-Feb-1946 DOA: 06/15/2015 PCP: [name], MD  Brief history 69 y.o. male SNF resident who has been in his facility since 7/2. Reports that on 8/1 had a near syncopal event.  Has been having frequent falls. Patient was evaluated at [facility] hospital on 06/14/15 and released. On 8/2 when brother visited, pt had blurry vision and complaints of dizziness. Needed help for transfers, etc due to feeling off balance. Was in a wheelchair when in the television room. Patient got up and fell forward.Patient brought back to the ED for further evaluation. LKW: unknown. Patient was not administered TPA secondary to unknown LKW. He was admitted for further evaluation and treatment Assessment/Plan: Acute ischemic stroke -Etiology unclear--intracranial atherosclerosis versus embolic -Neurology was consulted and evaluated the patient; full stroke workup was performed -MRI brain--acute ischemic right PCA territory infarct with a small 6 mm acute infarct in the left occipital lobe -CT angiogram head &neck--moderate proximal right PCA stenosis corresponding to the right occipital lobe infarct -Lower extremity venous duplex negative for DVT -LDL 51 -Hemoglobin A1c 7.0 -Neurology recommends 30 day cardiac event monitor--I spoke with cardiology on 06/19/15 [place] [facility] office will contact pt to come in once it is approved -Continue aspirin 325 mg per neurology recommendations Hypertension -Restart amlodipine 5 mg daily -Restart carvedilol 3.125 mg twice a day Dilantin toxicity -Dilantin level 44 on admission -Daily Dilantin level  -Restart Dilantin when level less than 20  -Dilantin ER 300 mg daily after d/c Seizure disorder  -Continue Keppra and Dilantin  Hyperlipidemia  -Continue Lipitor 40 mg daily  -LDL 51  Diabetes mellitus type 2  -Hold Glucovance -NovoLog sliding scale while in-house -Hemoglobin A1c 7.0  GERD  -Continue  Protonix  Thrombocytopenia -This appears to be chronic -Check B12 and the setting of unsteady gait  Family Communication:   Pt at beside Disposition Plan:   SNF 1-2 day     Procedures/Studies: Ct Angio Head W/cm &/or Wo Cm  06/17/2015   CLINICAL DATA:  Recent fall.  Visual changes.  EXAM: CT ANGIOGRAPHY HEAD AND NECK  TECHNIQUE: Multidetector CT imaging of the head and neck was performed using the standard protocol during bolus administration of intravenous contrast. Multiplanar CT image reconstructions and MIPs were obtained to evaluate the vascular anatomy. Carotid stenosis measurements (when applicable) are obtained utilizing NASCET criteria, using the distal internal carotid diameter as the denominator.  CONTRAST:  50mL OMNIPAQUE IOHEXOL 350 MG/ML SOLN  COMPARISON:  MRI brain 06/16/2015. CT head without contrast 06/15/2015.  FINDINGS: CT HEAD  Brain: The medial right occipital lobe infarct is again noted. There is no evidence for hemorrhage. Left ACA territory encephalomalacia is stable. A prominent left hemispheric cyst is again seen with mass effect.  Right supraorbital scalp soft tissue swelling is evident.  Calvarium and skull base: The calvarium is intact. Skullbase is unremarkable.  Paranasal sinuses: Clear  Orbits: The globes and orbits are intact.  CTA NECK  Aortic arch: A 3 vessel arch configuration is present without significant atherosclerotic calcification or stenosis.  Right carotid system: The right common carotid artery is within normal limits. Mild atherosclerotic changes are noted at the right carotid bifurcation without significant stenosis. The more distal cervical right ICA is within normal limits.  Left carotid system: The left common carotid artery is within normal limits. Atherosclerotic changes are present at the bifurcation without a significant stenosis relative to the more distal vessel. The  cervical left ICA is within normal limits.  Vertebral arteries:The vertebral  arteries originate from the subclavian arteries bilaterally. The right vertebral artery is the dominant vessel. There are no significant stenoses or evidence for focal injury within the vertebral arteries of the neck.  Skeleton: Multilevel endplate degenerative change is present. Prominent endplate osteophyte formation and uncovertebral spurring is most pronounced at C3-4 and C5-6. Multilevel endplate sclerotic changes are degenerative. No other focal lytic or blastic lesions are evident.  Other neck: Asymmetric left-sided thyroid goiter is again noted without significant interval change.  CTA HEAD  Anterior circulation: Minimal atherosclerotic calcifications are present within the cavernous internal carotid arteries bilaterally without significant stenosis. The A1 and M1 segments are within normal limits. The right A1 segment is dominant. The anterior communicating artery is patent. The MCA bifurcations are intact. There is moderate attenuation of more distal MCA branch vessels bilaterally, particularly on the left. ACA branch vessels are displaced by the midline cyst.  Posterior circulation: The vertebral arteries are within normal limits bilaterally. PICA origins are visualized and normal. Both posterior cerebral arteries originate from the basilar tip. A prominent right posterior communicating artery contributes. Moderate attenuation of PCA branch vessels is evident bilaterally. There is a moderate focal right P2 segment stenosis of 50-70% relative to the more distal vessel.  Venous sinuses: The dural sinuses are patent. The right transverse sinus is dominant. The straight sinus and deep cerebral veins are patent. Cortical veins are intact.  Anatomic variants: None  Delayed phase: The postcontrast images demonstrate no pathologic enhancement.  IMPRESSION: 1. Moderate proximal right PCA stenosis corresponds with the right occipital lobe infarct. 2. No significant change in the size the right occipital lobe  infarct or hemorrhage. 3. Remote encephalomalacia of the anterior left frontal lobe. 4. Midline cyst on the left with mass effect as described. 5. Mild diffuse small vessel disease evident on the MRA. 6. Multilevel degenerative changes throughout the cervical spine are most pronounced at C3-4 and C5-6. 7. Atherosclerotic changes within the carotid bifurcations bilaterally without significant stenosis.   Electronically Signed   By: Marin Roberts M.D.   On: 06/17/2015 09:48   Dg Lumbar Spine Complete  06/15/2015   CLINICAL DATA:  Status post fall, lower back pain. Initial encounter.  EXAM: LUMBAR SPINE - COMPLETE 4+ VIEW  COMPARISON:  Abdominal radiograph performed 06/29/2014  FINDINGS: There is no evidence of fracture or subluxation. Vertebral bodies demonstrate normal height and alignment. Intervertebral disc spaces are preserved. The visualized neural foramina are grossly unremarkable in appearance. Anterior osteophytes are noted along the lower thoracic and lumbar spine.  The visualized bowel gas pattern is unremarkable in appearance; air and stool are noted within the colon. The sacroiliac joints are within normal limits.  IMPRESSION: No evidence of fracture or subluxation along the lumbar spine.   Electronically Signed   By: Roanna Raider M.D.   On: 06/15/2015 21:28   Ct Head Wo Contrast  06/15/2015   ADDENDUM REPORT: 06/15/2015 22:56  ADDENDUM: CT - CERVICAL SPINE WITHOUT CONTRAST:  The vertebral column, pedicles and facet articulations are intact. There is no evidence of acute fracture. No acute soft tissue abnormalities are evident.  There are moderately severe degenerative disc changes with small posterior osteophytes at multiple levels. The thyroid goiter is again evident. It is incompletely imaged.  IMPRESSION: 1. Negative for acute cervical spine fracture 2. Incompletely imaged thyroid goiter. This was evaluated with ultrasonography on 12/11/2012.   Electronically Signed   By: Bevelyn Buckles  Clovis Riley M.D.   On: 06/15/2015 22:56   06/15/2015   CLINICAL DATA:  Larey Seat from wheelchair to carpeted floor. Abrasion above the right eye. Patient was seen yesterday for a different fall.  EXAM: CT HEAD WITHOUT CONTRAST  TECHNIQUE: Contiguous axial images were obtained from the base of the skull through the vertex without intravenous contrast.  COMPARISON:  06/14/2015, 07/04/2012.  FINDINGS: There is no intracranial hemorrhage. There is agenesis of the corpus callosum and a dorsal interhemispheric cyst extending to the left. There is new hypodensity in the medial right occipital lobe which likely represents cytotoxic edema associated with a subacute infarction. No other significant interval changes are evident. There is mild mass effect on the right occipital horn. There is no midline shift.  There is a right frontal scalp hematoma. Calvarium and skullbase are intact.  IMPRESSION: 1. Subacute nonhemorrhagic right occipital infarction. No midline shift. Mild mass effect on the right occipital horn. 2. Congenital agenesis of the corpus callosum with a dorsal interhemispheric cyst extending to the left. 3. Right frontal scalp hematoma. No evidence of acute intracranial traumatic injury.  Electronically Signed: By: Ellery Plunk M.D. On: 06/15/2015 21:21   Ct Head Wo Contrast  06/14/2015   CLINICAL DATA:  [facility], patient denies loss of consciousness  EXAM: CT HEAD WITHOUT CONTRAST  TECHNIQUE: Contiguous axial images were obtained from the base of the skull through the vertex without intravenous contrast.  COMPARISON:  MRI performed 07/04/2012  FINDINGS: Again identified is agenesis of the corpus callosum with colpocephaly of the ventricles. There is a large dorsal interhemispheric cyst projecting to the left of midline. These findings are stable. There is cerebellar atrophy which is unchanged. There is mild low attenuation in the periventricular white matter,  similar to prior study. No evidence of hemorrhage or extra-axial fluid. No skull fracture.  IMPRESSION: Stable chronic ventricular dilatation and colpocephaly, with chronic age-related involutional change and no acute findings.   Electronically Signed   By: Esperanza Heir M.D.   On: 06/14/2015 16:37   Ct Angio Neck W/cm &/or Wo/cm  06/17/2015   CLINICAL DATA:  Recent fall.  Visual changes.  EXAM: CT ANGIOGRAPHY HEAD AND NECK  TECHNIQUE: Multidetector CT imaging of the head and neck was performed using the standard protocol during bolus administration of intravenous contrast. Multiplanar CT image reconstructions and MIPs were obtained to evaluate the vascular anatomy. Carotid stenosis measurements (when applicable) are obtained utilizing NASCET criteria, using the distal internal carotid diameter as the denominator.  CONTRAST:  50mL OMNIPAQUE IOHEXOL 350 MG/ML SOLN  COMPARISON:  MRI brain 06/16/2015. CT head without contrast 06/15/2015.  FINDINGS: CT HEAD  Brain: The medial right occipital lobe infarct is again noted. There is no evidence for hemorrhage. Left ACA territory encephalomalacia is stable. A prominent left hemispheric cyst is again seen with mass effect.  Right supraorbital scalp soft tissue swelling is evident.  Calvarium and skull base: The calvarium is intact. Skullbase is unremarkable.  Paranasal sinuses: Clear  Orbits: The globes and orbits are intact.  CTA NECK  Aortic arch: A 3 vessel arch configuration is present without significant atherosclerotic calcification or stenosis.  Right carotid system: The right common carotid artery is within normal limits. Mild atherosclerotic changes are noted at the right carotid bifurcation without significant stenosis. The more distal cervical right ICA is within normal limits.  Left carotid system: The left common carotid artery is within normal limits. Atherosclerotic changes are present at  the bifurcation without a significant stenosis relative to the more  distal vessel. The cervical left ICA is within normal limits.  Vertebral arteries:The vertebral arteries originate from the subclavian arteries bilaterally. The right vertebral artery is the dominant vessel. There are no significant stenoses or evidence for focal injury within the vertebral arteries of the neck.  Skeleton: Multilevel endplate degenerative change is present. Prominent endplate osteophyte formation and uncovertebral spurring is most pronounced at C3-4 and C5-6. Multilevel endplate sclerotic changes are degenerative. No other focal lytic or blastic lesions are evident.  Other neck: Asymmetric left-sided thyroid goiter is again noted without significant interval change.  CTA HEAD  Anterior circulation: Minimal atherosclerotic calcifications are present within the cavernous internal carotid arteries bilaterally without significant stenosis. The A1 and M1 segments are within normal limits. The right A1 segment is dominant. The anterior communicating artery is patent. The MCA bifurcations are intact. There is moderate attenuation of more distal MCA branch vessels bilaterally, particularly on the left. ACA branch vessels are displaced by the midline cyst.  Posterior circulation: The vertebral arteries are within normal limits bilaterally. PICA origins are visualized and normal. Both posterior cerebral arteries originate from the basilar tip. A prominent right posterior communicating artery contributes. Moderate attenuation of PCA branch vessels is evident bilaterally. There is a moderate focal right P2 segment stenosis of 50-70% relative to the more distal vessel.  Venous sinuses: The dural sinuses are patent. The right transverse sinus is dominant. The straight sinus and deep cerebral veins are patent. Cortical veins are intact.  Anatomic variants: None  Delayed phase: The postcontrast images demonstrate no pathologic enhancement.  IMPRESSION: 1. Moderate proximal right PCA stenosis corresponds with the  right occipital lobe infarct. 2. No significant change in the size the right occipital lobe infarct or hemorrhage. 3. Remote encephalomalacia of the anterior left frontal lobe. 4. Midline cyst on the left with mass effect as described. 5. Mild diffuse small vessel disease evident on the MRA. 6. Multilevel degenerative changes throughout the cervical spine are most pronounced at C3-4 and C5-6. 7. Atherosclerotic changes within the carotid bifurcations bilaterally without significant stenosis.   Electronically Signed   By: Marin Roberts M.D.   On: 06/17/2015 09:48   Ct Cervical Spine Wo Contrast  06/15/2015   ADDENDUM REPORT: 06/15/2015 22:56  ADDENDUM: CT - CERVICAL SPINE WITHOUT CONTRAST:  The vertebral column, pedicles and facet articulations are intact. There is no evidence of acute fracture. No acute soft tissue abnormalities are evident.  There are moderately severe degenerative disc changes with small posterior osteophytes at multiple levels. The thyroid goiter is again evident. It is incompletely imaged.  IMPRESSION: 1. Negative for acute cervical spine fracture 2. Incompletely imaged thyroid goiter. This was evaluated with ultrasonography on 12/11/2012.   Electronically Signed   By: Ellery Plunk M.D.   On: 06/15/2015 22:56   06/15/2015   CLINICAL DATA:  Larey Seat from wheelchair to carpeted floor. Abrasion above the right eye. Patient was seen yesterday for a different fall.  EXAM: CT HEAD WITHOUT CONTRAST  TECHNIQUE: Contiguous axial images were obtained from the base of the skull through the vertex without intravenous contrast.  COMPARISON:  06/14/2015, 07/04/2012.  FINDINGS: There is no intracranial hemorrhage. There is agenesis of the corpus callosum and a dorsal interhemispheric cyst extending to the left. There is new hypodensity in the medial right occipital lobe which likely represents cytotoxic edema associated with a subacute infarction. No other significant interval changes are evident.  There is  mild mass effect on the right occipital horn. There is no midline shift.  There is a right frontal scalp hematoma. Calvarium and skullbase are intact.  IMPRESSION: 1. Subacute nonhemorrhagic right occipital infarction. No midline shift. Mild mass effect on the right occipital horn. 2. Congenital agenesis of the corpus callosum with a dorsal interhemispheric cyst extending to the left. 3. Right frontal scalp hematoma. No evidence of acute intracranial traumatic injury.  Electronically Signed: By: Ellery Plunk M.D. On: 06/15/2015 21:21   [name]  06/16/2015   CLINICAL DATA:  Initial evaluation for acute confusion, altered mental status. Recent fall.  EXAM: MRI HEAD WITHOUT CONTRAST  TECHNIQUE: Multiplanar, multiecho pulse sequences of the brain and surrounding structures were obtained without intravenous contrast.  COMPARISON:  Prior CT from 06/15/2015 as well as previous MRI from 07/04/2012.  FINDINGS: Study is limited as the patient was unable to tolerate the full length of the exam. Only diffusion weighted sequences and sagittal T1 weighted sequence were performed.  Axial diffusion-weighted sequence demonstrates abnormal restricted diffusion involving the medial right occipital lobe, consistent with acute right PCA territory infarct. No significant mass effect.  Additional tiny 6 mm ischemic infarct within the left occipital lobe (series 3, image 12).  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 No other definite acute abnormality.  IMPRESSION: 1. Limited study with only diffusion-weighted sequences performed. 2. Acute ischemic right PCA territory infarct without significant mass effect. 3. Additional small 6 mm acute ischemic infarct within the left occipital lobe. 4. 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 5. Small right frontal scalp contusion.    Electronically Signed   By: Rise Mu M.D.   On: 06/16/2015 01:47         Subjective: Patient denies fevers, chills, headache, chest pain, dyspnea, nausea, vomiting, diarrhea, abdominal pain, dysuria, hematuria. Denies any HA   Objective: Filed Vitals:   06/19/15 0113 06/19/15 0345 06/19/15 0529 06/19/15 0954  BP: 144/77  149/64 136/73  Pulse: 69  68 66  Temp: 98.5 F (36.9 C)  98.6 F (37 C) 98.1 F (36.7 C)  TempSrc: Oral  Oral Oral  Resp: SpO2: 97%  98% 99%    Intake/Output Summary (Last 24 hours) at 06/19/15 1316 Last data filed at 06/18/15 2015  Gross per 24 hour  Intake      0 ml  Output    200 ml  Net   -200 ml   Weight change:  Exam:   General:  Pt is alert, follows commands appropriately, not in acute distress  HEENT: No icterus, No thrush, No meningismus, [place]/AT  Cardiovascular: RRR, S1/S2, no rubs, no gallops  Respiratory: CTA bilaterally, no wheezing, no crackles, no rhonchi  Abdomen: Soft/+BS, non tender, non distended, no guarding  Extremities: No edema, No lymphangitis, No petechiae, No rashes, no synovitis  Data Reviewed: Basic Metabolic Panel:  Recent Labs Lab 06/14/15 1622 06/15/15 2130 06/17/15 0749  NA 141 141 138  K 4.3 4.1 3.5  CL 105 101 99*  CO2 GLUCOSE 85 133* 114*  BUN 24* 20 12  CREATININE 1.18 1.14 0.99  CALCIUM 8.9 9.4 8.5*   Liver Function Tests:  Recent Labs Lab 06/16/15 0212 06/18/15 1124  AST 27  --   ALT 17  --   ALKPHOS 125  --   BILITOT 0.7  --   PROT  6.9  --   ALBUMIN 3.8 3.6   No results for input(s): LIPASE, AMYLASE in the last 168 hours. No results for input(s): AMMONIA in the last 168 hours. CBC:  Recent Labs Lab 06/14/15 1622 06/15/15 2130 06/17/15 0749  WBC 10.7* 11.0* 9.2  NEUTROABS 6.9 7.3  --   HGB 12.5* 12.8* 12.4*  HCT 36.8* 36.5* 35.1*  MCV 89.3 88.0 88.0  PLT 147* 141* 139*   Cardiac Enzymes:  Recent Labs Lab 06/15/15 2130  TROPONINI  <0.03   BNP: Invalid input(s): POCBNP CBG:  Recent Labs Lab 06/18/15 1127 06/18/15 1621 06/18/15 2151 06/19/15 0631 06/19/15 1143  GLUCAP 137* 132* 116* 106* 139*    No results found for this or any previous visit (from the past 240 hour(s)).   Scheduled Meds: .  stroke: mapping our early stages of recovery book   Does not apply Once  . aspirin  300 mg Rectal Daily   Or  . aspirin  325 mg Oral Daily  . atorvastatin  40 mg Oral Daily  . escitalopram  5 mg Oral Daily  . heparin  5,000 Units Subcutaneous 3 times per day  . insulin aspart  0-5 Units Subcutaneous QHS  . insulin aspart  0-9 Units Subcutaneous TID WC  . levETIRAcetam  500 mg Oral BID  . pantoprazole  40 mg Oral Daily  . potassium chloride SA  20 mEq Oral Daily  . tamsulosin  0.4 mg Oral QPC supper   Continuous Infusions: . sodium chloride 75 mL/hr at 06/18/15 0305     Arietta Eisenstein, DO  [facility] Pager [phone]  If 7PM-7AM, please contact night-coverage [email or web address] Password [email or web address] 06/19/2015, 1:16 PM   LOS: 4 days

## 2015-06-19 NOTE — Progress Notes (Signed)
Occupational Therapy Treatment Patient Details Name: Arthur Berry MRN: [id number] DOB: 1946-05-14 Today's Date: 06/19/2015    History of present illness Patient is a 69 yo male admitted from [facility] (ALF) on 06/15/15 with vision changes and fall.  Per chart, patient has had multiple falls.  MRI:  Acute ischemic right PCA territory infarct and Additional small 6 mm acute ischemic infarct within the left occipital lobe.  PMH:  HTN, HLD, DM, seizures, CAD   OT comments  Pt making progress with functional goals, continues to require cues for safety, L side attention and memory deficits. Pt should continue with acute OT services to address impairments to increase level of function and safety to return to ALF  Follow Up Recommendations  Home health OT;Supervision/Assistance - 24 hour    Equipment Recommendations  None recommended by OT    Recommendations for Other Services      Precautions / Restrictions Precautions Precautions: Fall Precaution Comments: Per chart, patient had multiple falls.    Restrictions Weight Bearing Restrictions: No       Mobility Bed Mobility Overal bed mobility: Needs Assistance Bed Mobility: Supine to Sit;Sit to Supine     Supine to sit: Min guard Sit to supine: Min guard      Transfers Overall transfer level: Needs assistance Equipment used: Rolling walker (2 wheeled) Transfers: Sit to/from Stand Sit to Stand: Min assist         General transfer comment:  VC for hand placement, to scoot to edge of chair, and weight shift anteriorly to rise.    Balance   Sitting-balance support: No upper extremity supported;Feet supported Sitting balance-Leahy Scale: Fair     Standing balance support: During functional activity;Bilateral upper extremity supported Standing balance-Leahy Scale: Poor                     ADL   Eating/Feeding: Set up;Bed level   Grooming: Wash/dry hands;Oral care;Min guard;Cueing for  sequencing;Cueing for safety   Upper Body Bathing: Sitting;Min guard   Lower Body Bathing: Min guard;Minimal assistance;Sitting/lateral leans;Sit to/from stand   Upper Body Dressing : Sitting;Min guard   Lower Body Dressing: Minimal assistance;Sitting/lateral leans   Toilet Transfer: Minimal assistance;Ambulation;Comfort height toilet;Grab bars;RW   Toileting- Clothing Manipulation and Hygiene: Min guard       Functional mobility during ADLs: Minimal assistance;Cueing for safety;Cueing for sequencing;Rolling walker General ADL Comments: Verbal cues for visual attention and scanning to Lt with grooming, mobility and transfers      Vision   Alignment/Gaze Preference: Head turned;Gaze right             Additional Comments: approached pt from L side and spoke to pt and provided activity from L side              Cognition   Behavior During Therapy: Flat affect Overall Cognitive Status: Impaired/Different from baseline Area of Impairment: Orientation;Memory;Awareness     Memory: Decreased short-term memory               Extremity/Trunk Assessment   WFL                        General Comments  pt pleasant and cooperative    Pertinent Vitals/ Pain       Pain Assessment: No/denies pain  Home Living  ALF  Prior Functioning/Environment  sup           Frequency Min 2X/week     Progress Toward Goals  OT Goals(current goals can now be found in the care plan section)  Progress towards OT goals: Progressing toward goals     Plan Discharge plan remains appropriate                     End of Session Equipment Utilized During Treatment: Gait belt;Rolling walker   Activity Tolerance Patient tolerated treatment well;No increased pain   Patient Left with call bell/phone within reach;with family/visitor present;in bed   Nurse Communication          Time: 1610-9604 OT Time  Calculation (min): 27 min  Charges: OT General Charges $OT Visit: 1 Procedure OT Treatments $Self Care/Home Management : 8-22 mins $Therapeutic Activity: 8-22 mins  Galen Manila 06/19/2015, 1:00 PM

## 2015-06-19 NOTE — Progress Notes (Signed)
CRITICAL VALUE ALERT  Critical value received: Dilantin Level 31.7  Date of notification: 06/19/2015  Time of notification:  635  Critical value read back:Yes  Nurse who received alert:  Susie Cassette BSN, RN  MD notified (1st page):  Expected Lab Value Time of first page:    MD notified (2nd page):  Time of second page:  Responding MD:    Time MD responded:

## 2015-06-20 LAB — PHENYTOIN LEVEL, TOTAL: PHENYTOIN LVL: 25.3 ug/mL — AB (ref 10.0–20.0)

## 2015-06-20 LAB — BASIC METABOLIC PANEL
Anion gap: 10 (ref 5–15)
BUN: 8 mg/dL (ref 6–20)
CHLORIDE: 104 mmol/L (ref 101–111)
CO2: 26 mmol/L (ref 22–32)
Calcium: 8.6 mg/dL — ABNORMAL LOW (ref 8.9–10.3)
Creatinine, Ser: 0.91 mg/dL (ref 0.61–1.24)
GFR calc non Af Amer: >60 mL/min (ref >60)
GLUCOSE: 108 mg/dL — AB (ref 65–99)
Potassium: 3.4 mmol/L — ABNORMAL LOW (ref 3.5–5.1)
SODIUM: 140 mmol/L (ref 135–145)

## 2015-06-20 LAB — CBC
HCT: 32.6 % — ABNORMAL LOW (ref 39.0–52.0)
HEMOGLOBIN: 11.5 g/dL — AB (ref 13.0–17.0)
MCH: 30.7 pg (ref 26.0–34.0)
MCHC: 35.3 g/dL (ref 30.0–36.0)
MCV: 86.9 fL (ref 78.0–100.0)
Platelets: 146 10*3/uL — ABNORMAL LOW (ref 150–400)
RBC: 3.75 MIL/uL — AB (ref 4.22–5.81)
RDW: 12.8 % (ref 11.5–15.5)
WBC: 7.6 10*3/uL (ref 4.0–10.5)

## 2015-06-20 LAB — GLUCOSE, CAPILLARY
Glucose-Capillary: 102 mg/dL — ABNORMAL HIGH (ref 65–99)
Glucose-Capillary: 121 mg/dL — ABNORMAL HIGH (ref 65–99)
Glucose-Capillary: 172 mg/dL — ABNORMAL HIGH (ref 65–99)
Glucose-Capillary: 108 mg/dL — ABNORMAL HIGH (ref 65–99)

## 2015-06-20 LAB — MAGNESIUM: Magnesium: 1.3 mg/dL — ABNORMAL LOW (ref 1.7–2.4)

## 2015-06-20 LAB — VITAMIN B12: Vitamin B-12: 487 pg/mL (ref 180–914)

## 2015-06-20 MED ORDER — LISINOPRIL 10 MG PO TABS
10.0000 mg | ORAL_TABLET | Freq: Every day | ORAL | Status: DC
  Administered 2015-06-21: 10 mg via ORAL
  Filled 2015-06-20: qty 1

## 2015-06-20 NOTE — Progress Notes (Signed)
PROGRESS NOTE  Arthur Berry WUJ:[id number] DOB: 10/27/1946 DOA: 06/15/2015 PCP: [name], MD   Brief history 69 y.o. male SN[facility] resident who has been in his facility since 7/2. Reports that on 8/1 had a near syncopal event.  Has been having frequent falls. Patient was evaluated at [facility] hospital on 06/14/15 and released. On 8/2 when brother visited, pt had blurry vision and complaints of dizziness. Needed help for transfers, etc due to feeling off balance. Was in a wheelchair when in the television/living room. Patient got up and fell forward.Patient brought back to the ED for further evaluation. LKW: unknown. Patient was not administered TPA secondary to unknown LKW. He was admitted for further evaluation and treatment Assessment/Plan: Acute ischemic stroke -Etiology unclear--intracranial atherosclerosis versus embolic -Neurology was consulted and evaluated the patient; full stroke workup was performed -MRI brain--acute ischemic right PCA territory infarct with a small 6 mm acute infarct in the left occipital lobe (L-MCA/PCA) -CT angiogram head &neck--moderate proximal right PCA stenosis corresponding to the right occipital lobe infarct -Lower extremity venous duplex negative for DVT -LDL 51 -Hemoglobin A1c 7.0 Echocardiogram--EF 60-65%, no wall motion abnormality, grade 1 diastolic dysfunction- -Neurology recommends 30 day cardiac event monitor--I spoke with cardiology on 06/19/15 Nada Boozer, NP)--their office will contact pt to come in once it is approved -Continue aspirin 325 mg per neurology recommendations Hypertension -Continue amlodipine 5 mg daily -Continue carvedilol 3.125 mg twice a day -restart low dose lisinopril Dilantin toxicity -Dilantin level 44 on admission -This likely contributed to his dizziness and ataxia with falling -Daily Dilantin level  -Restart Dilantin when level less than 20  -Dilantin ER 300 mg daily after d/c Seizure disorder    -Continue Keppra and Dilantin  Hyperlipidemia  -Continue Lipitor 40 mg daily  -LDL 51  Diabetes mellitus type 2  -Hold Glucovance -NovoLog sliding scale while in-house -Hemoglobin A1c 7.0  GERD  -Continue Protonix  Thrombocytopenia -This appears to be chronic -Check B12 and the setting of unsteady gait--487  Family Communication: Pt at beside Disposition Plan: ALF if Dilantin level <20 on 8/8     Procedures/Studies: Ct Angio Head W/cm &/or Wo Cm  06/17/2015   CLINICAL DATA:  Recent fall.  Visual changes.  EXAM: CT ANGIOGRAPHY HEAD AND NECK  TECHNIQUE: Multidetector CT imaging of the head and neck was performed using the standard protocol during bolus administration of intravenous contrast. Multiplanar CT image reconstructions and MIPs were obtained to evaluate the vascular anatomy. Carotid stenosis measurements (when applicable) are obtained utilizing NASCET criteria, using the distal internal carotid diameter as the denominator.  CONTRAST:  50mL OMNIPAQUE IOHEXOL 350 MG/ML SOLN  COMPARISON:  MRI brain 06/16/2015. CT head without contrast 06/15/2015.  FINDINGS: CT HEAD  Brain: The medial right occipital lobe infarct is again noted. There is no evidence for hemorrhage. Left ACA territory encephalomalacia is stable. A prominent left hemispheric cyst is again seen with mass effect.  Right supraorbital scalp soft tissue swelling is evident.  Calvarium and skull base: The calvarium is intact. Skullbase is unremarkable.  Paranasal sinuses: Clear  Orbits: The globes and orbits are intact.  CTA NECK  Aortic arch: A 3 vessel arch configuration is present without significant atherosclerotic calcification or stenosis.  Right carotid system: The right common carotid artery is within normal limits. Mild atherosclerotic changes are noted at the right carotid bifurcation without significant stenosis. The more distal cervical right ICA is within normal limits.  Left carotid  system: The left common  carotid artery is within normal limits. Atherosclerotic changes are present at the bifurcation without a significant stenosis relative to the more distal vessel. The cervical left ICA is within normal limits.  Vertebral arteries:The vertebral arteries originate from the subclavian arteries bilaterally. The right vertebral artery is the dominant vessel. There are no significant stenoses or evidence for focal injury within the vertebral arteries of the neck.  Skeleton: Multilevel endplate degenerative change is present. Prominent endplate osteophyte formation and uncovertebral spurring is most pronounced at C3-4 and C5-6. Multilevel endplate sclerotic changes are degenerative. No other focal lytic or blastic lesions are evident.  Other neck: Asymmetric left-sided thyroid goiter is again noted without significant interval change.  CTA HEAD  Anterior circulation: Minimal atherosclerotic calcifications are present within the cavernous internal carotid arteries bilaterally without significant stenosis. The A1 and M1 segments are within normal limits. The right A1 segment is dominant. The anterior communicating artery is patent. The MCA bifurcations are intact. There is moderate attenuation of more distal MCA branch vessels bilaterally, particularly on the left. ACA branch vessels are displaced by the midline cyst.  Posterior circulation: The vertebral arteries are within normal limits bilaterally. PICA origins are visualized and normal. Both posterior cerebral arteries originate from the basilar tip. A prominent right posterior communicating artery contributes. Moderate attenuation of PCA branch vessels is evident bilaterally. There is a moderate focal right P2 segment stenosis of 50-70% relative to the more distal vessel.  Venous sinuses: The dural sinuses are patent. The right transverse sinus is dominant. The straight sinus and deep cerebral veins are patent. Cortical veins are intact.  Anatomic variants: None  Delayed  phase: The postcontrast images demonstrate no pathologic enhancement.  IMPRESSION: 1. Moderate proximal right PCA stenosis corresponds with the right occipital lobe infarct. 2. No significant change in the size the right occipital lobe infarct or hemorrhage. 3. Remote encephalomalacia of the anterior left frontal lobe. 4. Midline cyst on the left with mass effect as described. 5. Mild diffuse small vessel disease evident on the MRA. 6. Multilevel degenerative changes throughout the cervical spine are most pronounced at C3-4 and C5-6. 7. Atherosclerotic changes within the carotid bifurcations bilaterally without significant stenosis.   Electronically Signed   By: Marin Roberts M.D.   On: 06/17/2015 09:48   Dg Lumbar Spine Complete  06/15/2015   CLINICAL DATA:  Status post fall, lower back pain. Initial encounter.  EXAM: LUMBAR SPINE - COMPLETE 4+ VIEW  COMPARISON:  Abdominal radiograph performed 06/29/2014  FINDINGS: There is no evidence of fracture or subluxation. Vertebral bodies demonstrate normal height and alignment. Intervertebral disc spaces are preserved. The visualized neural foramina are grossly unremarkable in appearance. Anterior osteophytes are noted along the lower thoracic and lumbar spine.  The visualized bowel gas pattern is unremarkable in appearance; air and stool are noted within the colon. The sacroiliac joints are within normal limits.  IMPRESSION: No evidence of fracture or subluxation along the lumbar spine.   Electronically Signed   By: Roanna Raider M.D.   On: 06/15/2015 21:28   Ct Head Wo Contrast  06/15/2015   ADDENDUM REPORT: 06/15/2015 22:56  ADDENDUM: CT - CERVICAL SPINE WITHOUT CONTRAST:  The vertebral column, pedicles and facet articulations are intact. There is no evidence of acute fracture. No acute soft tissue abnormalities are evident.  There are moderately severe degenerative disc changes with small posterior osteophytes at multiple levels. The thyroid goiter is again  evident. It is incompletely imaged.  IMPRESSION: 1. Negative for acute cervical spine fracture 2. Incompletely imaged thyroid goiter. This was evaluated with ultrasonography on 12/11/2012.   Electronically Signed   By: Ellery Plunk M.D.   On: 06/15/2015 22:56   06/15/2015   CLINICAL DATA:  Larey Seat from wheelchair to carpeted floor. Abrasion above the right eye. Patient was seen yesterday for a different fall.  EXAM: CT HEAD WITHOUT CONTRAST  TECHNIQUE: Contiguous axial images were obtained from the base of the skull through the vertex without intravenous contrast.  COMPARISON:  06/14/2015, 07/04/2012.  FINDINGS: There is no intracranial hemorrhage. There is agenesis of the corpus callosum and a dorsal interhemispheric cyst extending to the left. There is new hypodensity in the medial right occipital lobe which likely represents cytotoxic edema associated with a subacute infarction. No other significant interval changes are evident. There is mild mass effect on the right occipital horn. There is no midline shift.  There is a right frontal scalp hematoma. Calvarium and skullbase are intact.  IMPRESSION: 1. Subacute nonhemorrhagic right occipital infarction. No midline shift. Mild mass effect on the right occipital horn. 2. Congenital agenesis of the corpus callosum with a dorsal interhemispheric cyst extending to the left. 3. Right frontal scalp hematoma. No evidence of acute intracranial traumatic injury.  Electronically Signed: By: Ellery Plunk M.D. On: 06/15/2015 21:21   Ct Head Wo Contrast  06/14/2015   CLINICAL DATA:  [facility] denies loss of consciousness  EXAM: CT HEAD WITHOUT CONTRAST  TECHNIQUE: Contiguous axial images were obtained from the base of the skull through the vertex without intravenous contrast.  COMPARISON:  MRI performed 07/04/2012  FINDINGS: Again identified is agenesis of the corpus callosum with colpocephaly of the ventricles.  There is a large dorsal interhemispheric cyst projecting to the left of midline. These findings are stable. There is cerebellar atrophy which is unchanged. There is mild low attenuation in the periventricular white matter, similar to prior study. No evidence of hemorrhage or extra-axial fluid. No skull fracture.  IMPRESSION: Stable chronic ventricular dilatation and colpocephaly, with chronic age-related involutional change and no acute findings.   Electronically Signed   By: Esperanza Heir M.D.   On: 06/14/2015 16:37   Ct Angio Neck W/cm &/or Wo/cm  06/17/2015   CLINICAL DATA:  Recent fall.  Visual changes.  EXAM: CT ANGIOGRAPHY HEAD AND NECK  TECHNIQUE: Multidetector CT imaging of the head and neck was performed using the standard protocol during bolus administration of intravenous contrast. Multiplanar CT image reconstructions and MIPs were obtained to evaluate the vascular anatomy. Carotid stenosis measurements (when applicable) are obtained utilizing NASCET criteria, using the distal internal carotid diameter as the denominator.  CONTRAST:  50mL OMNIPAQUE IOHEXOL 350 MG/ML SOLN  COMPARISON:  MRI brain 06/16/2015. CT head without contrast 06/15/2015.  FINDINGS: CT HEAD  Brain: The medial right occipital lobe infarct is again noted. There is no evidence for hemorrhage. Left ACA territory encephalomalacia is stable. A prominent left hemispheric cyst is again seen with mass effect.  Right supraorbital scalp soft tissue swelling is evident.  Calvarium and skull base: The calvarium is intact. Skullbase is unremarkable.  Paranasal sinuses: Clear  Orbits: The globes and orbits are intact.  CTA NECK  Aortic arch: A 3 vessel arch configuration is present without significant atherosclerotic calcification or stenosis.  Right carotid system: The right common carotid artery is within normal limits. Mild atherosclerotic changes are noted at the right carotid bifurcation without significant  stenosis. The more distal  cervical right ICA is within normal limits.  Left carotid system: The left common carotid artery is within normal limits. Atherosclerotic changes are present at the bifurcation without a significant stenosis relative to the more distal vessel. The cervical left ICA is within normal limits.  Vertebral arteries:The vertebral arteries originate from the subclavian arteries bilaterally. The right vertebral artery is the dominant vessel. There are no significant stenoses or evidence for focal injury within the vertebral arteries of the neck.  Skeleton: Multilevel endplate degenerative change is present. Prominent endplate osteophyte formation and uncovertebral spurring is most pronounced at C3-4 and C5-6. Multilevel endplate sclerotic changes are degenerative. No other focal lytic or blastic lesions are evident.  Other neck: Asymmetric left-sided thyroid goiter is again noted without significant interval change.  CTA HEAD  Anterior circulation: Minimal atherosclerotic calcifications are present within the cavernous internal carotid arteries bilaterally without significant stenosis. The A1 and M1 segments are within normal limits. The right A1 segment is dominant. The anterior communicating artery is patent. The MCA bifurcations are intact. There is moderate attenuation of more distal MCA branch vessels bilaterally, particularly on the left. ACA branch vessels are displaced by the midline cyst.  Posterior circulation: The vertebral arteries are within normal limits bilaterally. PICA origins are visualized and normal. Both posterior cerebral arteries originate from the basilar tip. A prominent right posterior communicating artery contributes. Moderate attenuation of PCA branch vessels is evident bilaterally. There is a moderate focal right P2 segment stenosis of 50-70% relative to the more distal vessel.  Venous sinuses: The dural sinuses are patent. The right transverse sinus is dominant. The straight sinus and deep  cerebral veins are patent. Cortical veins are intact.  Anatomic variants: None  Delayed phase: The postcontrast images demonstrate no pathologic enhancement.  IMPRESSION: 1. Moderate proximal right PCA stenosis corresponds with the right occipital lobe infarct. 2. No significant change in the size the right occipital lobe infarct or hemorrhage. 3. Remote encephalomalacia of the anterior left frontal lobe. 4. Midline cyst on the left with mass effect as described. 5. Mild diffuse small vessel disease evident on the MRA. 6. Multilevel degenerative changes throughout the cervical spine are most pronounced at C3-4 and C5-6. 7. Atherosclerotic changes within the carotid bifurcations bilaterally without significant stenosis.   Electronically Signed   By: Marin Roberts M.D.   On: 06/17/2015 09:48   Ct Cervical Spine Wo Contrast  06/15/2015   ADDENDUM REPORT: 06/15/2015 22:56  ADDENDUM: CT - CERVICAL SPINE WITHOUT CONTRAST:  The vertebral column, pedicles and facet articulations are intact. There is no evidence of acute fracture. No acute soft tissue abnormalities are evident.  There are moderately severe degenerative disc changes with small posterior osteophytes at multiple levels. The thyroid goiter is again evident. It is incompletely imaged.  IMPRESSION: 1. Negative for acute cervical spine fracture 2. Incompletely imaged thyroid goiter. This was evaluated with ultrasonography on 12/11/2012.   Electronically Signed   By: Ellery Plunk M.D.   On: 06/15/2015 22:56   06/15/2015   CLINICAL DATA:  Larey Seat from wheelchair to carpeted floor. Abrasion above the right eye. Patient was seen yesterday for a different fall.  EXAM: CT HEAD WITHOUT CONTRAST  TECHNIQUE: Contiguous axial images were obtained from the base of the skull through the vertex without intravenous contrast.  COMPARISON:  06/14/2015, 07/04/2012.  FINDINGS: There is no intracranial hemorrhage. There is agenesis of the corpus callosum and a dorsal  interhemispheric cyst extending to the left.  There is new hypodensity in the medial right occipital lobe which likely represents cytotoxic edema associated with a subacute infarction. No other significant interval changes are evident. There is mild mass effect on the right occipital horn. There is no midline shift.  There is a right frontal scalp hematoma. Calvarium and skullbase are intact.  IMPRESSION: 1. Subacute nonhemorrhagic right occipital infarction. No midline shift. Mild mass effect on the right occipital horn. 2. Congenital agenesis of the corpus callosum with a dorsal interhemispheric cyst extending to the left. 3. Right frontal scalp hematoma. No evidence of acute intracranial traumatic injury.  Electronically Signed: By: Ellery Plunk M.D. On: 06/15/2015 21:21   [name]  06/16/2015   CLINICAL DATA:  Initial evaluation for acute confusion, altered mental status. Recent fall.  EXAM: MRI HEAD WITHOUT CONTRAST  TECHNIQUE: Multiplanar, multiecho pulse sequences of the brain and surrounding structures were obtained without intravenous contrast.  COMPARISON:  Prior CT from 06/15/2015 as well as previous MRI from 07/04/2012.  FINDINGS: Study is limited as the patient was unable to tolerate the full length of the exam. Only diffusion weighted sequences and sagittal T1 weighted sequence were performed.  Axial diffusion-weighted sequence demonstrates abnormal restricted diffusion involving the medial right occipital lobe, consistent with acute right PCA territory infarct. No significant mass effect.  Additional tiny 6 mm ischemic infarct within the left occipital lobe (series 3, image 12).  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 No other definite acute abnormality.  IMPRESSION: 1. Limited study with only diffusion-weighted sequences performed. 2. Acute ischemic right PCA territory infarct without significant mass  effect. 3. Additional small 6 mm acute ischemic infarct within the left occipital lobe. 4. 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 5. Small right frontal scalp contusion.   Electronically Signed   By: Rise Mu M.D.   On: 06/16/2015 01:47        Subjective: Patient denies fevers, chills, headache, chest pain, dyspnea, nausea, vomiting, diarrhea, abdominal pain, dysuria, hematuria   Objective: Filed Vitals:   06/20/15 0145 06/20/15 0501 06/20/15 1011 06/20/15 1330  BP: 129/74 154/76 135/79 140/69  Pulse: 65 64 63 67  Temp: 98.1 F (36.7 C) 98.1 F (36.7 C) 98.2 F (36.8 C) 98.2 F (36.8 C)  TempSrc: Oral Oral Oral Oral  Resp: 19 18 18 18   SpO2: 94% 97% 96% 98%    Intake/Output Summary (Last 24 hours) at 06/20/15 1435 Last data filed at 06/20/15 0800  Gross per 24 hour  Intake    240 ml  Output   1250 ml  Net  -1010 ml   Weight change:  Exam:   General:  Pt is alert, follows commands appropriately, not in acute distress  HEENT: No icterus, No thrush, No neck mass, [place]/AT  Cardiovascular: RRR, S1/S2, no rubs, no gallops  Respiratory: CTA bilaterally, no wheezing, no crackles, no rhonchi  Abdomen: Soft/+BS, non tender, non distended, no guarding  Extremities: No edema, No lymphangitis, No petechiae, No rashes, no synovitis  Data Reviewed: Basic Metabolic Panel:  Recent Labs Lab 06/14/15 1622 06/15/15 2130 06/17/15 0749 06/20/15 0820  NA 141 141 138 140  K 4.3 4.1 3.5 3.4*  CL 105 101 99* 104  CO2 28 29 28 26   GLUCOSE 85 133* 114* 108*  BUN 24* 20 12 8   CREATININE 1.18 1.14 0.99 0.91  CALCIUM 8.9 9.4 8.5* 8.6*  MG  --   --   --  1.3*   Liver Function Tests:  Recent Labs Lab 06/16/15 0212 06/18/15 1124  AST 27  --   ALT 17  --   ALKPHOS 125  --   BILITOT 0.7  --   PROT 6.9  --   ALBUMIN 3.8 3.6   No results for input(s): LIPASE, AMYLASE in the last 168 hours. No results  for input(s): AMMONIA in the last 168 hours. CBC:  Recent Labs Lab 06/14/15 1622 06/15/15 2130 06/17/15 0749 06/20/15 0820  WBC 10.7* 11.0* 9.2 7.6  NEUTROABS 6.9 7.3  --   --   HGB 12.5* 12.8* 12.4* 11.5*  HCT 36.8* 36.5* 35.1* 32.6*  MCV 89.3 88.0 88.0 86.9  PLT 147* 141* 139* 146*   Cardiac Enzymes:  Recent Labs Lab 06/15/15 2130  TROPONINI <0.03   BNP: Invalid input(s): POCBNP CBG:  Recent Labs Lab 06/19/15 1143 06/19/15 1642 06/19/15 2140 06/20/15 0643 06/20/15 1127  GLUCAP 139* 170* 99 102* 121*    No results found for this or any previous visit (from the past 240 hour(s)).   Scheduled Meds: .  stroke: mapping our early stages of recovery book   Does not apply Once  . amLODipine  5 mg Oral Daily  . aspirin  300 mg Rectal Daily   Or  . aspirin  325 mg Oral Daily  . atorvastatin  40 mg Oral Daily  . carvedilol  3.125 mg Oral BID WC  . docusate sodium  100 mg Oral BID  . escitalopram  5 mg Oral Daily  . heparin  5,000 Units Subcutaneous 3 times per day  . insulin aspart  0-5 Units Subcutaneous QHS  . insulin aspart  0-9 Units Subcutaneous TID WC  . levETIRAcetam  500 mg Oral BID  . lisinopril  10 mg Oral Daily  . pantoprazole  40 mg Oral Daily  . potassium chloride SA  20 mEq Oral Daily  . senna  2 tablet Oral Daily  . tamsulosin  0.4 mg Oral QPC supper   Continuous Infusions: . sodium chloride 75 mL/hr at 06/19/15 1944     Salle Brandle, DO  [facility] Pager [phone]  If 7PM-7AM, please contact night-coverage [email or web address] Password [email or web address] 06/20/2015, 2:35 PM   LOS: 5 days

## 2015-06-21 ENCOUNTER — Telehealth: Payer: Self-pay | Admitting: Cardiology

## 2015-06-21 DIAGNOSIS — T420X1S Poisoning by hydantoin derivatives, accidental (unintentional), sequela: Secondary | ICD-10-CM

## 2015-06-21 LAB — BASIC METABOLIC PANEL
ANION GAP: 12 (ref 5–15)
BUN: 8 mg/dL (ref 6–20)
CO2: 25 mmol/L (ref 22–32)
CREATININE: 0.94 mg/dL (ref 0.61–1.24)
Calcium: 8.7 mg/dL — ABNORMAL LOW (ref 8.9–10.3)
Chloride: 101 mmol/L (ref 101–111)
GFR calc non Af Amer: >60 mL/min (ref >60)
Glucose, Bld: 109 mg/dL — ABNORMAL HIGH (ref 65–99)
Potassium: 3.3 mmol/L — ABNORMAL LOW (ref 3.5–5.1)
SODIUM: 138 mmol/L (ref 135–145)

## 2015-06-21 LAB — GLUCOSE, CAPILLARY
GLUCOSE-CAPILLARY: 112 mg/dL — AB (ref 65–99)
GLUCOSE-CAPILLARY: 226 mg/dL — AB (ref 65–99)
GLUCOSE-CAPILLARY: 119 mg/dL — AB (ref 65–99)
Glucose-Capillary: 119 mg/dL — ABNORMAL HIGH (ref 65–99)

## 2015-06-21 LAB — PHENYTOIN LEVEL, TOTAL: PHENYTOIN LVL: 22.7 ug/mL — AB (ref 10.0–20.0)

## 2015-06-21 MED ORDER — POTASSIUM CHLORIDE CRYS ER 20 MEQ PO TBCR
20.0000 meq | EXTENDED_RELEASE_TABLET | Freq: Once | ORAL | Status: AC
  Administered 2015-06-21: 20 meq via ORAL
  Filled 2015-06-21: qty 1

## 2015-06-21 NOTE — Progress Notes (Signed)
PROGRESS NOTE  Arthur Berry ZOX:[id number] DOB: [birth year] DOA: 06/15/2015 PCP: [name], MD  Brief history 69 y.o. male SNF resident who has been in his facility since 7/2. Reports that on 8/1 had a near syncopal event.  Has been having frequent falls. Patient was evaluated at [facility] on 06/14/15 and released. On 8/2 when brother visited, pt had blurry vision and complaints of dizziness. Needed help for transfers, etc due to feeling off balance. Was in a wheelchair when in the television/living room. Patient got up and fell forward.Patient brought back to the ED for further evaluation. LKW: unknown. Patient was not administered TPA secondary to unknown LKW. He was admitted for further evaluation and treatment Assessment/Plan: Acute ischemic stroke -Etiology unclear--intracranial atherosclerosis versus embolic -Neurology was consulted and evaluated the patient; full stroke workup was performed -MRI brain--acute ischemic right PCA territory infarct with a small 6 mm acute infarct in the left occipital lobe (L-MCA/PCA) -CT angiogram head &neck--moderate proximal right PCA stenosis corresponding to the right occipital lobe infarct -Lower extremity venous duplex negative for DVT -LDL 51 -Hemoglobin A1c 7.0 Echocardiogram--EF 60-65%, no wall motion abnormality, grade 1 diastolic dysfunction- -Neurology recommends 30 day cardiac event monitor--I spoke with cardiology on 06/19/15 Nada Boozer, NP)--their office will contact pt to come in once it is approved -Continue aspirin 325 mg per neurology recommendations Hypertension -Continue amlodipine 5 mg daily -Continue carvedilol 3.125 mg twice a day -restart low dose lisinopril 10 mg daily Dilantin toxicity -Dilantin level 44 on admission-->22.7 -This likely contributed to his dizziness and ataxia with falling -Daily Dilantin level  -Restart Dilantin when level less than 20  -Dilantin ER 300 mg daily after d/c Seizure  disorder  -Continue Keppra and Dilantin  Hyperlipidemia  -Continue Lipitor 40 mg daily  -LDL 51  Diabetes mellitus type 2  -Hold Glucovance -NovoLog sliding scale while in-house -Hemoglobin A1c 7.0  GERD  -Continue Protonix  Thrombocytopenia -This appears to be chronic -Check B12 and the setting of unsteady gait--487  Family Communication: Pt at beside Disposition Plan: ALF if Dilantin level <20 on 8/9   Procedures/Studies: Ct Angio Head W/cm &/or Wo Cm  06/17/2015   CLINICAL DATA:  Recent fall.  Visual changes.  EXAM: CT ANGIOGRAPHY HEAD AND NECK  TECHNIQUE: Multidetector CT imaging of the head and neck was performed using the standard protocol during bolus administration of intravenous contrast. Multiplanar CT image reconstructions and MIPs were obtained to evaluate the vascular anatomy. Carotid stenosis measurements (when applicable) are obtained utilizing NASCET criteria, using the distal internal carotid diameter as the denominator.  CONTRAST:  50mL OMNIPAQUE IOHEXOL 350 MG/ML SOLN  COMPARISON:  MRI brain 06/16/2015. CT head without contrast 06/15/2015.  FINDINGS: CT HEAD  Brain: The medial right occipital lobe infarct is again noted. There is no evidence for hemorrhage. Left ACA territory encephalomalacia is stable. A prominent left hemispheric cyst is again seen with mass effect.  Right supraorbital scalp soft tissue swelling is evident.  Calvarium and skull base: The calvarium is intact. Skullbase is unremarkable.  Paranasal sinuses: Clear  Orbits: The globes and orbits are intact.  CTA NECK  Aortic arch: A 3 vessel arch configuration is present without significant atherosclerotic calcification or stenosis.  Right carotid system: The right common carotid artery is within normal limits. Mild atherosclerotic changes are noted at the right carotid bifurcation without significant stenosis. The more distal cervical right ICA is within normal limits.  Left carotid system:  The left  common carotid artery is within normal limits. Atherosclerotic changes are present at the bifurcation without a significant stenosis relative to the more distal vessel. The cervical left ICA is within normal limits.  Vertebral arteries:The vertebral arteries originate from the subclavian arteries bilaterally. The right vertebral artery is the dominant vessel. There are no significant stenoses or evidence for focal injury within the vertebral arteries of the neck.  Skeleton: Multilevel endplate degenerative change is present. Prominent endplate osteophyte formation and uncovertebral spurring is most pronounced at C3-4 and C5-6. Multilevel endplate sclerotic changes are degenerative. No other focal lytic or blastic lesions are evident.  Other neck: Asymmetric left-sided thyroid goiter is again noted without significant interval change.  CTA HEAD  Anterior circulation: Minimal atherosclerotic calcifications are present within the cavernous internal carotid arteries bilaterally without significant stenosis. The A1 and M1 segments are within normal limits. The right A1 segment is dominant. The anterior communicating artery is patent. The MCA bifurcations are intact. There is moderate attenuation of more distal MCA branch vessels bilaterally, particularly on the left. ACA branch vessels are displaced by the midline cyst.  Posterior circulation: The vertebral arteries are within normal limits bilaterally. PICA origins are visualized and normal. Both posterior cerebral arteries originate from the basilar tip. A prominent right posterior communicating artery contributes. Moderate attenuation of PCA branch vessels is evident bilaterally. There is a moderate focal right P2 segment stenosis of 50-70% relative to the more distal vessel.  Venous sinuses: The dural sinuses are patent. The right transverse sinus is dominant. The straight sinus and deep cerebral veins are patent. Cortical veins are intact.  Anatomic variants: None   Delayed phase: The postcontrast images demonstrate no pathologic enhancement.  IMPRESSION: 1. Moderate proximal right PCA stenosis corresponds with the right occipital lobe infarct. 2. No significant change in the size the right occipital lobe infarct or hemorrhage. 3. Remote encephalomalacia of the anterior left frontal lobe. 4. Midline cyst on the left with mass effect as described. 5. Mild diffuse small vessel disease evident on the MRA. 6. Multilevel degenerative changes throughout the cervical spine are most pronounced at C3-4 and C5-6. 7. Atherosclerotic changes within the carotid bifurcations bilaterally without significant stenosis.   Electronically Signed   By: Marin Roberts M.D.   On: 06/17/2015 09:48   Dg Lumbar Spine Complete  06/15/2015   CLINICAL DATA:  Status post fall, lower back pain. Initial encounter.  EXAM: LUMBAR SPINE - COMPLETE 4+ VIEW  COMPARISON:  Abdominal radiograph performed 06/29/2014  FINDINGS: There is no evidence of fracture or subluxation. Vertebral bodies demonstrate normal height and alignment. Intervertebral disc spaces are preserved. The visualized neural foramina are grossly unremarkable in appearance. Anterior osteophytes are noted along the lower thoracic and lumbar spine.  The visualized bowel gas pattern is unremarkable in appearance; air and stool are noted within the colon. The sacroiliac joints are within normal limits.  IMPRESSION: No evidence of fracture or subluxation along the lumbar spine.   Electronically Signed   By: Roanna Raider M.D.   On: 06/15/2015 21:28   Ct Head Wo Contrast  06/15/2015   ADDENDUM REPORT: 06/15/2015 22:56  ADDENDUM: CT - CERVICAL SPINE WITHOUT CONTRAST:  The vertebral column, pedicles and facet articulations are intact. There is no evidence of acute fracture. No acute soft tissue abnormalities are evident.  There are moderately severe degenerative disc changes with small posterior osteophytes at multiple levels. The thyroid goiter  is again evident. It is incompletely imaged.  IMPRESSION:  1. Negative for acute cervical spine fracture 2. Incompletely imaged thyroid goiter. This was evaluated with ultrasonography on 12/11/2012.   Electronically Signed   By: Ellery Plunk M.D.   On: 06/15/2015 22:56   06/15/2015   CLINICAL DATA:  Larey Seat from wheelchair to carpeted floor. Abrasion above the right eye. Patient was seen yesterday for a different fall.  EXAM: CT HEAD WITHOUT CONTRAST  TECHNIQUE: Contiguous axial images were obtained from the base of the skull through the vertex without intravenous contrast.  COMPARISON:  06/14/2015, 07/04/2012.  FINDINGS: There is no intracranial hemorrhage. There is agenesis of the corpus callosum and a dorsal interhemispheric cyst extending to the left. There is new hypodensity in the medial right occipital lobe which likely represents cytotoxic edema associated with a subacute infarction. No other significant interval changes are evident. There is mild mass effect on the right occipital horn. There is no midline shift.  There is a right frontal scalp hematoma. Calvarium and skullbase are intact.  IMPRESSION: 1. Subacute nonhemorrhagic right occipital infarction. No midline shift. Mild mass effect on the right occipital horn. 2. Congenital agenesis of the corpus callosum with a dorsal interhemispheric cyst extending to the left. 3. Right frontal scalp hematoma. No evidence of acute intracranial traumatic injury.  Electronically Signed: By: Ellery Plunk M.D. On: 06/15/2015 21:21   Ct Head Wo Contrast  06/14/2015   CLINICAL DATA:  [facility], patient denies loss of consciousness  EXAM: CT HEAD WITHOUT CONTRAST  TECHNIQUE: Contiguous axial images were obtained from the base of the skull through the vertex without intravenous contrast.  COMPARISON:  MRI performed 07/04/2012  FINDINGS: Again identified is agenesis of the corpus callosum with colpocephaly of the  ventricles. There is a large dorsal interhemispheric cyst projecting to the left of midline. These findings are stable. There is cerebellar atrophy which is unchanged. There is mild low attenuation in the periventricular white matter, similar to prior study. No evidence of hemorrhage or extra-axial fluid. No skull fracture.  IMPRESSION: Stable chronic ventricular dilatation and colpocephaly, with chronic age-related involutional change and no acute findings.   Electronically Signed   By: Esperanza Heir M.D.   On: 06/14/2015 16:37   Ct Angio Neck W/cm &/or Wo/cm  06/17/2015   CLINICAL DATA:  Recent fall.  Visual changes.  EXAM: CT ANGIOGRAPHY HEAD AND NECK  TECHNIQUE: Multidetector CT imaging of the head and neck was performed using the standard protocol during bolus administration of intravenous contrast. Multiplanar CT image reconstructions and MIPs were obtained to evaluate the vascular anatomy. Carotid stenosis measurements (when applicable) are obtained utilizing NASCET criteria, using the distal internal carotid diameter as the denominator.  CONTRAST:  50mL OMNIPAQUE IOHEXOL 350 MG/ML SOLN  COMPARISON:  MRI brain 06/16/2015. CT head without contrast 06/15/2015.  FINDINGS: CT HEAD  Brain: The medial right occipital lobe infarct is again noted. There is no evidence for hemorrhage. Left ACA territory encephalomalacia is stable. A prominent left hemispheric cyst is again seen with mass effect.  Right supraorbital scalp soft tissue swelling is evident.  Calvarium and skull base: The calvarium is intact. Skullbase is unremarkable.  Paranasal sinuses: Clear  Orbits: The globes and orbits are intact.  CTA NECK  Aortic arch: A 3 vessel arch configuration is present without significant atherosclerotic calcification or stenosis.  Right carotid system: The right common carotid artery is within normal limits. Mild atherosclerotic changes are noted at the right carotid bifurcation without significant stenosis.  The more  distal cervical right ICA is within normal limits.  Left carotid system: The left common carotid artery is within normal limits. Atherosclerotic changes are present at the bifurcation without a significant stenosis relative to the more distal vessel. The cervical left ICA is within normal limits.  Vertebral arteries:The vertebral arteries originate from the subclavian arteries bilaterally. The right vertebral artery is the dominant vessel. There are no significant stenoses or evidence for focal injury within the vertebral arteries of the neck.  Skeleton: Multilevel endplate degenerative change is present. Prominent endplate osteophyte formation and uncovertebral spurring is most pronounced at C3-4 and C5-6. Multilevel endplate sclerotic changes are degenerative. No other focal lytic or blastic lesions are evident.  Other neck: Asymmetric left-sided thyroid goiter is again noted without significant interval change.  CTA HEAD  Anterior circulation: Minimal atherosclerotic calcifications are present within the cavernous internal carotid arteries bilaterally without significant stenosis. The A1 and M1 segments are within normal limits. The right A1 segment is dominant. The anterior communicating artery is patent. The MCA bifurcations are intact. There is moderate attenuation of more distal MCA branch vessels bilaterally, particularly on the left. ACA branch vessels are displaced by the midline cyst.  Posterior circulation: The vertebral arteries are within normal limits bilaterally. PICA origins are visualized and normal. Both posterior cerebral arteries originate from the basilar tip. A prominent right posterior communicating artery contributes. Moderate attenuation of PCA branch vessels is evident bilaterally. There is a moderate focal right P2 segment stenosis of 50-70% relative to the more distal vessel.  Venous sinuses: The dural sinuses are patent. The right transverse sinus is dominant. The straight sinus and  deep cerebral veins are patent. Cortical veins are intact.  Anatomic variants: None  Delayed phase: The postcontrast images demonstrate no pathologic enhancement.  IMPRESSION: 1. Moderate proximal right PCA stenosis corresponds with the right occipital lobe infarct. 2. No significant change in the size the right occipital lobe infarct or hemorrhage. 3. Remote encephalomalacia of the anterior left frontal lobe. 4. Midline cyst on the left with mass effect as described. 5. Mild diffuse small vessel disease evident on the MRA. 6. Multilevel degenerative changes throughout the cervical spine are most pronounced at C3-4 and C5-6. 7. Atherosclerotic changes within the carotid bifurcations bilaterally without significant stenosis.   Electronically Signed   By: Marin Roberts M.D.   On: 06/17/2015 09:48   Ct Cervical Spine Wo Contrast  06/15/2015   ADDENDUM REPORT: 06/15/2015 22:56  ADDENDUM: CT - CERVICAL SPINE WITHOUT CONTRAST:  The vertebral column, pedicles and facet articulations are intact. There is no evidence of acute fracture. No acute soft tissue abnormalities are evident.  There are moderately severe degenerative disc changes with small posterior osteophytes at multiple levels. The thyroid goiter is again evident. It is incompletely imaged.  IMPRESSION: 1. Negative for acute cervical spine fracture 2. Incompletely imaged thyroid goiter. This was evaluated with ultrasonography on 12/11/2012.   Electronically Signed   By: Ellery Plunk M.D.   On: 06/15/2015 22:56   06/15/2015   CLINICAL DATA:  Larey Seat from wheelchair to carpeted floor. Abrasion above the right eye. Patient was seen yesterday for a different fall.  EXAM: CT HEAD WITHOUT CONTRAST  TECHNIQUE: Contiguous axial images were obtained from the base of the skull through the vertex without intravenous contrast.  COMPARISON:  06/14/2015, 07/04/2012.  FINDINGS: There is no intracranial hemorrhage. There is agenesis of the corpus callosum and a dorsal  interhemispheric cyst extending to the left. There  is new hypodensity in the medial right occipital lobe which likely represents cytotoxic edema associated with a subacute infarction. No other significant interval changes are evident. There is mild mass effect on the right occipital horn. There is no midline shift.  There is a right frontal scalp hematoma. Calvarium and skullbase are intact.  IMPRESSION: 1. Subacute nonhemorrhagic right occipital infarction. No midline shift. Mild mass effect on the right occipital horn. 2. Congenital agenesis of the corpus callosum with a dorsal interhemispheric cyst extending to the left. 3. Right frontal scalp hematoma. No evidence of acute intracranial traumatic injury.  Electronically Signed: By: Ellery Plunk M.D. On: 06/15/2015 21:21   [name]  06/16/2015   CLINICAL DATA:  Initial evaluation for acute confusion, altered mental status. Recent fall.  EXAM: MRI HEAD WITHOUT CONTRAST  TECHNIQUE: Multiplanar, multiecho pulse sequences of the brain and surrounding structures were obtained without intravenous contrast.  COMPARISON:  Prior CT from 06/15/2015 as well as previous MRI from 07/04/2012.  FINDINGS: Study is limited as the patient was unable to tolerate the full length of the exam. Only diffusion weighted sequences and sagittal T1 weighted sequence were performed.  Axial diffusion-weighted sequence demonstrates abnormal restricted diffusion involving the medial right occipital lobe, consistent with acute right PCA territory infarct. No significant mass effect.  Additional tiny 6 mm ischemic infarct within the left occipital lobe (series 3, image 12).  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 No other definite acute abnormality.  IMPRESSION: 1. Limited study with only diffusion-weighted sequences performed. 2. Acute ischemic right PCA territory infarct without significant mass  effect. 3. Additional small 6 mm acute ischemic infarct within the left occipital lobe. 4. 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 5. Small right frontal scalp contusion.   Electronically Signed   By: Rise Mu M.D.   On: 06/16/2015 01:47         Subjective: Patient denies fevers, chills, headache, chest pain, dyspnea, nausea, vomiting, diarrhea, abdominal pain, dysuria, hematuria   Objective: Filed Vitals:   06/21/15 0158 06/21/15 0559 06/21/15 0934 06/21/15 1433  BP: 140/70 131/73 140/66 142/95  Pulse: 67 65 83 68  Temp: 99.1 F (37.3 C) 98.9 F (37.2 C) 98.5 F (36.9 C) 98 F (36.7 C)  TempSrc: Oral Oral Oral Oral  Resp: 19 18 20 20   SpO2: 93% 97% 98% 95%    Intake/Output Summary (Last 24 hours) at 06/21/15 1444 Last data filed at 06/20/15 2100  Gross per 24 hour  Intake      0 ml  Output    600 ml  Net   -600 ml   Weight change:  Exam:   General:  Pt is alert, follows commands appropriately, not in acute distress  HEENT: No icterus, No thrush, No neck mass, [place]/AT  Cardiovascular: RRR, S1/S2, no rubs, no gallops  Respiratory: CTA bilaterally, no wheezing, no crackles, no rhonchi  Abdomen: Soft/+BS, non tender, non distended, no guarding  Extremities: No edema, No lymphangitis, No petechiae, No rashes, no synovitis  Data Reviewed: Basic Metabolic Panel:  Recent Labs Lab 06/14/15 1622 06/15/15 2130 06/17/15 0749 06/20/15 0820 06/21/15 0748  NA 141 141 138 140 138  K 4.3 4.1 3.5 3.4* 3.3*  CL 105 101 99* 104 101  CO2 28 29 28 26 25   GLUCOSE 85 133* 114* 108* 109*  BUN 24* 20 12 8 8   CREATININE 1.18 1.14  0.99 0.91 0.94  CALCIUM 8.9 9.4 8.5* 8.6* 8.7*  MG  --   --   --  1.3*  --    Liver Function Tests:  Recent Labs Lab 06/16/15 0212 06/18/15 1124  AST 27  --   ALT 17  --   ALKPHOS 125  --   BILITOT 0.7  --   PROT 6.9  --   ALBUMIN 3.8 3.6   No results for  input(s): LIPASE, AMYLASE in the last 168 hours. No results for input(s): AMMONIA in the last 168 hours. CBC:  Recent Labs Lab 06/14/15 1622 06/15/15 2130 06/17/15 0749 06/20/15 0820  WBC 10.7* 11.0* 9.2 7.6  NEUTROABS 6.9 7.3  --   --   HGB 12.5* 12.8* 12.4* 11.5*  HCT 36.8* 36.5* 35.1* 32.6*  MCV 89.3 88.0 88.0 86.9  PLT 147* 141* 139* 146*   Cardiac Enzymes:  Recent Labs Lab 06/15/15 2130  TROPONINI <0.03   BNP: Invalid input(s): POCBNP CBG:  Recent Labs Lab 06/20/15 1127 06/20/15 1618 06/20/15 2210 06/21/15 0645 06/21/15 1126  GLUCAP 121* 172* 108* 112* 226*    No results found for this or any previous visit (from the past 240 hour(s)).   Scheduled Meds: .  stroke: mapping our early stages of recovery book   Does not apply Once  . amLODipine  5 mg Oral Daily  . aspirin  300 mg Rectal Daily   Or  . aspirin  325 mg Oral Daily  . atorvastatin  40 mg Oral Daily  . carvedilol  3.125 mg Oral BID WC  . docusate sodium  100 mg Oral BID  . escitalopram  5 mg Oral Daily  . heparin  5,000 Units Subcutaneous 3 times per day  . insulin aspart  0-5 Units Subcutaneous QHS  . insulin aspart  0-9 Units Subcutaneous TID WC  . levETIRAcetam  500 mg Oral BID  . lisinopril  10 mg Oral Daily  . pantoprazole  40 mg Oral Daily  . potassium chloride SA  20 mEq Oral Daily  . senna  2 tablet Oral Daily  . tamsulosin  0.4 mg Oral QPC supper   Continuous Infusions:    Tynisa Vohs, DO  [facility] Pager [phone]  If 7PM-7AM, please contact night-coverage [email or web address] Password [email or web address] 06/21/2015, 2:44 PM   LOS: 6 days

## 2015-06-21 NOTE — Care Management Note (Signed)
Case Management Note  Patient Details  Name: GARDY MONTANARI MRN: [id number] Date of Birth: 28-Nov-1945  Subjective/Objective:                    Action/Plan: Per Tammy at [facility]  Expected Discharge Date:                  Expected Discharge Plan:  Home w Home Health Services ( [facility])  In-House Referral:  Clinical Social Work  Discharge planning Services  CM Consult  Post Acute Care Choice:  Home Health Choice offered to:     DME Arranged:    DME Agency:     HH Arranged:  PT, OT HH Agency:  [facility]  Status of Service:  Completed, signed off  Medicare Important Message Given:  Yes-second notification given Date Medicare IM Given:    Medicare IM give by:    Date Additional Medicare IM Given:    Additional Medicare Important Message give by:     If discussed at Long Length of Stay Meetings, dates discussed:    Additional Comments:  Anda Kraft, RN 06/21/2015, 2:00 PM

## 2015-06-21 NOTE — Clinical Social Work Note (Signed)
CSW reviewed chart. The pt will transition back to ALF if his Dilantin level is <20 on 8/8. CSW will discuss with MD.    Gwynne Edinger, MSW, Theresia Majors [phone]

## 2015-06-21 NOTE — Progress Notes (Signed)
Physical Therapy Treatment Patient Details Name: Arthur Berry MRN: [id number] DOB: Dec 16, 1945 Today's Date: 06/21/2015    History of Present Illness Patient is a 69 yo male admitted from [facility] ([facility]) on 06/15/15 with vision changes and fall.  Per chart, patient has had multiple falls.  MRI:  Acute ischemic right PCA territory infarct and Additional small 6 mm acute ischemic infarct within the left occipital lobe.  PMH:  HTN, HLD, DM, seizures, CAD    PT Comments    Patient progressing slowly with mobility. Continues to have confusion and poor safety awareness requiring Min A for mobility. Pt high falls risk. Poor ability to scan environment without cues bumping into objects. Pt requires 24/7 S hands on assist for safety. Will continue to follow acutely per current POC.   Follow Up Recommendations  Supervision/Assistance - 24 hour;Home health PT     Equipment Recommendations  None recommended by PT    Recommendations for Other Services       Precautions / Restrictions Precautions Precautions: Fall Precaution Comments: Per chart, patient had multiple falls.    Restrictions Weight Bearing Restrictions: No    Mobility  Bed Mobility Overal bed mobility: Needs Assistance Bed Mobility: Rolling;Sidelying to Sit;Sit to Sidelying Rolling: Min guard Sidelying to sit: Min guard     Sit to sidelying: Min guard General bed mobility comments: HOB flat, no use of rails and no physical assist needed.  Transfers Overall transfer level: Needs assistance Equipment used: Rolling walker (2 wheeled) Transfers: Sit to/from Stand Sit to Stand: Min guard         General transfer comment: Min guard for safety. + dizziness upon standing. VC's for hand placement and to scoot to EOB as pt half way to EOB and then trying to get back into bed when told to scoot.  Ambulation/Gait Ambulation/Gait assistance: Min assist Ambulation Distance (Feet): 100 Feet Assistive device:  Rolling walker (2 wheeled) Gait Pattern/deviations: Step-through pattern;Decreased stride length;Drifts right/left;Trunk flexed Gait velocity: decreased   General Gait Details: Pt bumping into objects on right/left at times; cues for RW proximity/safety. Min A for balance and to correct for drifting. Able to stop and read objects in hallway but has to stop completely. + walk and talk test.    Stairs            Wheelchair Mobility    Modified Rankin (Stroke Patients Only) Modified Rankin (Stroke Patients Only) Pre-Morbid Rankin Score: Moderate disability Modified Rankin: Moderately severe disability     Balance Overall balance assessment: Needs assistance Sitting-balance support: Feet supported;No upper extremity supported Sitting balance-Leahy Scale: Fair     Standing balance support: During functional activity Standing balance-Leahy Scale: Poor                      Cognition Arousal/Alertness: Awake/alert Behavior During Therapy: Flat affect Overall Cognitive Status: Impaired/Different from baseline Area of Impairment: Orientation Orientation Level: Disoriented to;Place;Time [employer] [facility]" "3rd month or before that?" even after told it was end of summer)   Memory: Decreased short-term memory         General Comments: Perseverating on certain phrases- "i used to work here you know."    Exercises      General Comments        Pertinent Vitals/Pain Pain Assessment: No/denies pain    Home Living                      Prior Function  PT Goals (current goals can now be found in the care plan section) Progress towards PT goals: Progressing toward goals    Frequency  Min 3X/week    PT Plan Current plan remains appropriate    Co-evaluation             End of Session Equipment Utilized During Treatment: Gait belt Activity Tolerance: Patient tolerated treatment well Patient left: in bed;with call  bell/phone within reach;with bed alarm set     Time: 1343-1400 PT Time Calculation (min) (ACUTE ONLY): 17 min  Charges:  $Gait Training: 8-22 mins                    G Codes:      Effie Janoski A Abisola Carrero 06/21/2015, 2:10 PM Mylo Red, PT, DPT [phone]

## 2015-06-21 NOTE — Care Management Important Message (Signed)
Important Message  Patient Details  Name: Arthur Berry MRN: [id number] Date of Birth: [birth year]   Medicare Important Message Given:  Yes-third notification given    Orson Aloe 06/21/2015, 4:03 PM

## 2015-06-21 NOTE — Progress Notes (Signed)
Occupational Therapy Treatment Patient Details Name: SHAMUS DESANTIS MRN: [id number] DOB: Mar 14, 1946 Today's Date: 06/21/2015    History of present illness Patient is a 69 yo male admitted from [facility] (ALF) on 06/15/15 with vision changes and fall.  Per chart, patient has had multiple falls.  MRI:  Acute ischemic right PCA territory infarct and Additional small 6 mm acute ischemic infarct within the left occipital lobe.  PMH:  HTN, HLD, DM, seizures, CAD   OT comments  Pt. Progressing with acute OT goals.  Focus of session: compensation techniques to aide with visual deficits to promote safety during ADLS.  Will continue to follow acutely.    Follow Up Recommendations  Home health OT;Supervision/Assistance - 24 hour    Equipment Recommendations  None recommended by OT    Recommendations for Other Services      Precautions / Restrictions Precautions Precautions: Fall Precaution Comments: Per chart, patient had multiple falls.    Restrictions Weight Bearing Restrictions: No       Mobility Bed Mobility Overal bed mobility: Needs Assistance Bed Mobility: Rolling;Sidelying to Sit Rolling: Min guard Sidelying to sit: Min guard          Transfers Overall transfer level: Needs assistance Equipment used: Rolling walker (2 wheeled) Transfers: Sit to/from [employer] Sit to Stand: Min assist         General transfer comment:  VC for hand placement, to scoot to edge of chair, and weight shift anteriorly to rise.    Balance     Sitting balance-Leahy Scale: Fair       Standing balance-Leahy Scale: Poor                     ADL Overall ADL's : Needs assistance/impaired Eating/Feeding: Set up;Cueing for sequencing;Cueing for compensatory techinques;Sitting Eating/Feeding Details (indicate cue type and reason): minimized distrations and provided cues and techniques for turning head to scan and focus on items on tray, container,  utensils ect. to compensate for L initention                 Lower Body Dressing: Set up;Sitting/lateral leans;Cueing for compensatory techniques;Cueing for sequencing Lower Body Dressing Details (indicate cue type and reason): pt. required increased time secondary to notable difficulty focusing on fully donning sock, easily distracted and requires cues to look at the area he is working on ie: turning head to left and looking down at foot while trying to don sock as he was trying to complete while looking at t.v. Toilet Transfer: Minimal assistance;Stand-pivot;RW Toilet Transfer Details (indicate cue type and reason): simulated during stand pivot transfer to recliner Toileting- Clothing Manipulation and Hygiene: Min guard Toileting - Clothing Manipulation Details (indicate cue type and reason): simulated during transfer     Functional mobility during ADLs: Minimal assistance;Cueing for safety;Cueing for sequencing;Rolling walker General ADL Comments: Verbal cues for visual attention and scanning to Lt with LB ADLS, mobility and transfers      Vision Eye Alignment: Impaired (comment) Alignment/Gaze Preference: Head turned;Gaze right Ocular Range of Motion: Impaired-to be further tested in functional context Tracking/Visual Pursuits: Decreased smoothness of horizontal tracking;Requires cues, head turns, or add eye shifts to track;Unable to hold eye position out of midline             Perception     Praxis      Cognition   Behavior During Therapy: Flat affect Overall Cognitive Status: Impaired/Different from baseline Area of Impairment: Orientation;Memory;Awareness Orientation Level: Disoriented  to;Time   Memory: Decreased short-term memory      Awareness: Intellectual        Extremity/Trunk Assessment               Exercises     Shoulder Instructions       General Comments      Pertinent Vitals/ Pain       Pain Assessment: No/denies pain  Home  Living                                          Prior Functioning/Environment              Frequency Min 2X/week     Progress Toward Goals  OT Goals(current goals can now be found in the care plan section)  Progress towards OT goals: Progressing toward goals     Plan Discharge plan remains appropriate    Co-evaluation                 End of Session Equipment Utilized During Treatment: Gait belt;Rolling walker   Activity Tolerance Patient tolerated treatment well   Patient Left in chair;with call bell/phone within reach;with chair alarm set   Nurse Communication          Time: 1610-9604 OT Time Calculation (min): 11 min  Charges: OT General Charges $OT Visit: 1 Procedure OT Treatments $Self Care/Home Management : 8-22 mins  Robet Leu, COTA/L 06/21/2015, 8:55 AM

## 2015-06-21 NOTE — Progress Notes (Signed)
SLP Cancellation Note  Patient Details Name: Arthur Berry MRN: [id number] DOB: Jun 02, 1946   Cancelled treatment:       Reason Eval/Treat Not Completed: Fatigue/lethargy limiting ability to participate   Ardice Boyan,PAT, M.S., CCC-SLP 06/21/2015, 3:48 PM

## 2015-06-22 DIAGNOSIS — D696 Thrombocytopenia, unspecified: Secondary | ICD-10-CM

## 2015-06-22 LAB — BASIC METABOLIC PANEL
Anion gap: 11 (ref 5–15)
BUN: 13 mg/dL (ref 6–20)
CALCIUM: 9.3 mg/dL (ref 8.9–10.3)
CHLORIDE: 103 mmol/L (ref 101–111)
CO2: 26 mmol/L (ref 22–32)
Creatinine, Ser: 1.2 mg/dL (ref 0.61–1.24)
GFR calc Af Amer: >60 mL/min (ref >60)
GFR calc non Af Amer: 60 mL/min — ABNORMAL LOW (ref >60)
GLUCOSE: 152 mg/dL — AB (ref 65–99)
Potassium: 3.7 mmol/L (ref 3.5–5.1)
SODIUM: 140 mmol/L (ref 135–145)

## 2015-06-22 LAB — PHENYTOIN LEVEL, TOTAL: PHENYTOIN LVL: 18.9 ug/mL (ref 10.0–20.0)

## 2015-06-22 LAB — GLUCOSE, CAPILLARY: Glucose-Capillary: 107 mg/dL — ABNORMAL HIGH (ref 65–99)

## 2015-06-22 MED ORDER — LISINOPRIL 20 MG PO TABS
20.0000 mg | ORAL_TABLET | Freq: Every day | ORAL | Status: DC
Start: 2015-06-22 — End: 2017-10-27

## 2015-06-22 MED ORDER — LISINOPRIL 20 MG PO TABS
20.0000 mg | ORAL_TABLET | Freq: Every day | ORAL | Status: DC
  Administered 2015-06-22: 20 mg via ORAL
  Filled 2015-06-22: qty 1

## 2015-06-22 MED ORDER — PHENYTOIN SODIUM EXTENDED 100 MG PO CAPS: 300.0000 mg | ORAL_CAPSULE | Freq: Every day | ORAL | Status: DC

## 2015-06-22 MED ORDER — ASPIRIN 325 MG PO TABS: 325.0000 mg | ORAL_TABLET | Freq: Every day | ORAL | Status: DC

## 2015-06-22 MED ORDER — PANTOPRAZOLE SODIUM 40 MG PO TBEC: 40.0000 mg | DELAYED_RELEASE_TABLET | Freq: Every day | ORAL | Status: AC

## 2015-06-22 NOTE — Discharge Summary (Signed)
Physician Discharge Summary  Arthur Berry AVW:[id number] DOB: 1946-09-29 DOA: 06/15/2015  PCP: [name], MD  Admit date: 06/15/2015 Discharge date: 06/22/2015  Recommendations for Outpatient Follow-up:  1. Pt will need to follow up with PCP in 2 weeks post discharge 2. Please obtain BMP and dilantin level on 06/28/15 3. Please resume Dilantin ER, 300 mg daily on 06/23/2015 Discharge Diagnoses:  Acute ischemic stroke -Etiology unclear--intracranial atherosclerosis versus embolic -Neurology was consulted and evaluated the patient; full stroke workup was performed -MRI brain--acute ischemic right PCA territory infarct with a small 6 mm acute infarct in the left occipital lobe (L-MCA/PCA) -CT angiogram head &neck--moderate proximal right PCA stenosis corresponding to the right occipital lobe infarct -Lower extremity venous duplex negative for DVT -LDL 51 -Hemoglobin A1c 7.0 Echocardiogram--EF 60-65%, no wall motion abnormality, grade 1 diastolic dysfunction- -Neurology recommends 30 day cardiac event monitor--I spoke with cardiology on 06/19/15 Nada Boozer, NP)--their office will contact pt to come in once it is approved -Continue aspirin 325 mg per neurology recommendations Hypertension -Continue amlodipine 5 mg daily -Continue carvedilol 3.125 mg twice a day -increased  lisinopril 20 mg daily Dilantin toxicity -Dilantin level 44 on admission-->22.7 (8/816) -This likely contributed to his dizziness and ataxia with falling -Daily Dilantin level  -Restarted Dilantin when level less than 20  -Dilantin ER 300 mg daily (per neurology recommendation) after d/c--start on 06/23/2015 -Clinically improved -Home health physical therapy and occupational therapy was set up at the patient's assisted living facility.  Seizure disorder  -Continue Keppra and Dilantin  Hyperlipidemia  -Continue Lipitor 40 mg daily  -LDL 51  Diabetes mellitus type 2  -Hold Glucovance during the  hospitalization; this medication will be restarted after discharge. -NovoLog sliding scale while in-house -Hemoglobin A1c 7.0  GERD  -Continue Protonix  Thrombocytopenia -This appears to be chronic -Check B12 and the setting of unsteady gait--487  Discharge Condition: stable  Disposition: ALF Follow-up Information    Follow up with PENUMALLI,VIKRAM, MD. Schedule an appointment as soon as possible for a visit in 2 months.   Specialties:  Neurology, Radiology   Contact information:   [address] [place] [address] [phone]       Follow up with Rollene Rotunda, MD.   Specialty:  Cardiology   Why:  office will call for you to ahve a monitor placed and follow up with Dr. Enzo Bi information:   [address] [place] [address] [phone]       Diet:heart healthy Wt Readings from Last 3 Encounters:  06/09/14 88.451 kg (195 lb)  04/23/14 89.359 kg (197 lb)  04/16/14 87.091 kg (192 lb)    History of present illness:  69 y.o. male SNF resident who has been in his facility since 7/2. Reports that on 8/1 had a near syncopal event.  Has been having frequent falls. Patient was evaluated at [facility] hospital on 06/14/15 and released. On 8/2 when brother visited, pt had blurry vision and complaints of dizziness. Needed help for transfers, etc due to feeling off balance. Was in a wheelchair when in the television/living room. Patient got up and fell forward.Patient brought back to the ED for further evaluation. LKW: unknown. Patient was not administered TPA secondary to unknown LKW. He was admitted for further evaluation and treatment. The patient underwent a full stroke workup. MRI of the brain revealed a right PCA territory infarct. Neurology was consulted. They followed the patient and make further recommendations. The patient will continue  on aspirin 325 mg daily. Physical therapy was consulted. They recommended home health physical  therapy which was set up with assistance of care management. The patient will be discharged in stable condition back to his assisted living facility. With regard to his ataxia, the patient was noted to be Dilantin toxic. Neurology was consulted once again. They recommended decreasing the patient's Dilantin dose to 300 mg daily. The patient's Dilantin level was monitored and trended back down to therapeutic range. Instructions were provided to restart Dilantin 06/23/2015. Please recheck Dilantin level on 06/28/2015 and adjust accordingly. Neurology recommended a 30 day event monitor. I personally spoke with cardiology. They will contact the patient regarding set up of the event monitor once his insurance has approved.    Consultants: neurology  Discharge Exam: Filed Vitals:   06/22/15 0616  BP: 133/71  Pulse: 62  Temp: 98 F (36.7 C)  Resp: 17   Filed Vitals:   06/21/15 1821 06/21/15 2201 06/22/15 0155 06/22/15 0616  BP: 162/78 138/69 137/78 133/71  Pulse: 88 67 64 62  Temp: 98.7 F (37.1 C) 98.5 F (36.9 C) 98 F (36.7 C) 98 F (36.7 C)  TempSrc: Oral Oral Oral Oral  Resp: 18 19 19 17   SpO2: 97% 98% 96% 95%   General: Alert and awake, NAD, pleasant, cooperative Cardiovascular: RRR, no rub, no gallop, no S3 Respiratory: CTAB, no wheeze, no rhonchi Abdomen:soft, nontender, nondistended, positive bowel sounds Extremities: trace LE edema, No lymphangitis, no petechiae  Discharge Instructions      Discharge Instructions    Ambulatory referral to Neurology    Complete by:  As directed   Pt will follow up with Dr. Kinnie Scales at [facility] in about 2 months. Pt has been followed by Dr. Kinnie Scales in the past as per pt and his sister. Thanks.            Medication List    STOP taking these medications        aspirin EC 81 MG tablet  Replaced by:  aspirin 325 MG tablet      TAKE these medications        amLODipine 5 MG tablet  Commonly known as:  NORVASC  Take 5 mg by mouth  every morning.     aspirin 325 MG tablet  Take 1 tablet (325 mg total) by mouth daily.     atorvastatin 40 MG tablet  Commonly known as:  LIPITOR  Take 40 mg by mouth daily.     carvedilol 6.25 MG tablet  Commonly known as:  COREG  Take 3.125 mg by mouth 2 (two) times daily with a meal.     escitalopram 5 MG tablet  Commonly known as:  LEXAPRO  Take 5 mg by mouth daily.     glyBURIDE-metformin 2.5-500 MG per tablet  Commonly known as:  GLUCOVANCE  Take 1 tablet by mouth 2 (two) times daily with a meal.     levETIRAcetam 500 MG tablet  Commonly known as:  KEPPRA  Take 500 mg by mouth 2 (two) times daily.     lisinopril 20 MG tablet  Commonly known as:  PRINIVIL,ZESTRIL  Take 1 tablet (20 mg total) by mouth daily.     pantoprazole 40 MG tablet  Commonly known as:  PROTONIX  Take 1 tablet (40 mg total) by mouth daily.     phenytoin 100 MG ER capsule  Commonly known as:  DILANTIN  Take 3 capsules (300 mg total) by mouth daily. Start 06/23/2015  Start taking on:  06/23/2015     potassium chloride SA 20 MEQ tablet  Commonly known as:  K-DUR,KLOR-CON  Take 20 mEq by mouth daily.     risperiDONE 1 MG tablet  Commonly known as:  RISPERDAL  Take 1 mg by mouth at bedtime.     SYSTANE ULTRA OP  Place 1 drop into both eyes daily.     tamsulosin 0.4 MG Caps capsule  Commonly known as:  FLOMAX  Take 0.4 mg by mouth daily after supper.         The results of significant diagnostics from this hospitalization (including imaging, microbiology, ancillary and laboratory) are listed below for reference.    Significant Diagnostic Studies: Ct Angio Head W/cm &/or Wo Cm  06/17/2015   CLINICAL DATA:  Recent fall.  Visual changes.  EXAM: CT ANGIOGRAPHY HEAD AND NECK  TECHNIQUE: Multidetector CT imaging of the head and neck was performed using the standard protocol during bolus administration of intravenous contrast. Multiplanar CT image reconstructions and MIPs were obtained to  evaluate the vascular anatomy. Carotid stenosis measurements (when applicable) are obtained utilizing NASCET criteria, using the distal internal carotid diameter as the denominator.  CONTRAST:  50mL OMNIPAQUE IOHEXOL 350 MG/ML SOLN  COMPARISON:  MRI brain 06/16/2015. CT head without contrast 06/15/2015.  FINDINGS: CT HEAD  Brain: The medial right occipital lobe infarct is again noted. There is no evidence for hemorrhage. Left ACA territory encephalomalacia is stable. A prominent left hemispheric cyst is again seen with mass effect.  Right supraorbital scalp soft tissue swelling is evident.  Calvarium and skull base: The calvarium is intact. Skullbase is unremarkable.  Paranasal sinuses: Clear  Orbits: The globes and orbits are intact.  CTA NECK  Aortic arch: A 3 vessel arch configuration is present without significant atherosclerotic calcification or stenosis.  Right carotid system: The right common carotid artery is within normal limits. Mild atherosclerotic changes are noted at the right carotid bifurcation without significant stenosis. The more distal cervical right ICA is within normal limits.  Left carotid system: The left common carotid artery is within normal limits. Atherosclerotic changes are present at the bifurcation without a significant stenosis relative to the more distal vessel. The cervical left ICA is within normal limits.  Vertebral arteries:The vertebral arteries originate from the subclavian arteries bilaterally. The right vertebral artery is the dominant vessel. There are no significant stenoses or evidence for focal injury within the vertebral arteries of the neck.  Skeleton: Multilevel endplate degenerative change is present. Prominent endplate osteophyte formation and uncovertebral spurring is most pronounced at C3-4 and C5-6. Multilevel endplate sclerotic changes are degenerative. No other focal lytic or blastic lesions are evident.  Other neck: Asymmetric left-sided thyroid goiter is again  noted without significant interval change.  CTA HEAD  Anterior circulation: Minimal atherosclerotic calcifications are present within the cavernous internal carotid arteries bilaterally without significant stenosis. The A1 and M1 segments are within normal limits. The right A1 segment is dominant. The anterior communicating artery is patent. The MCA bifurcations are intact. There is moderate attenuation of more distal MCA branch vessels bilaterally, particularly on the left. ACA branch vessels are displaced by the midline cyst.  Posterior circulation: The vertebral arteries are within normal limits bilaterally. PICA origins are visualized and normal. Both posterior cerebral arteries originate from the basilar tip. A prominent right posterior communicating artery contributes. Moderate attenuation of PCA branch vessels is evident bilaterally. There is a moderate focal right P2 segment stenosis of 50-70% relative  to the more distal vessel.  Venous sinuses: The dural sinuses are patent. The right transverse sinus is dominant. The straight sinus and deep cerebral veins are patent. Cortical veins are intact.  Anatomic variants: None  Delayed phase: The postcontrast images demonstrate no pathologic enhancement.  IMPRESSION: 1. Moderate proximal right PCA stenosis corresponds with the right occipital lobe infarct. 2. No significant change in the size the right occipital lobe infarct or hemorrhage. 3. Remote encephalomalacia of the anterior left frontal lobe. 4. Midline cyst on the left with mass effect as described. 5. Mild diffuse small vessel disease evident on the MRA. 6. Multilevel degenerative changes throughout the cervical spine are most pronounced at C3-4 and C5-6. 7. Atherosclerotic changes within the carotid bifurcations bilaterally without significant stenosis.   Electronically Signed   By: Marin Roberts M.D.   On: 06/17/2015 09:48   Dg Lumbar Spine Complete  06/15/2015   CLINICAL DATA:  Status post  fall, lower back pain. Initial encounter.  EXAM: LUMBAR SPINE - COMPLETE 4+ VIEW  COMPARISON:  Abdominal radiograph performed 06/29/2014  FINDINGS: There is no evidence of fracture or subluxation. Vertebral bodies demonstrate normal height and alignment. Intervertebral disc spaces are preserved. The visualized neural foramina are grossly unremarkable in appearance. Anterior osteophytes are noted along the lower thoracic and lumbar spine.  The visualized bowel gas pattern is unremarkable in appearance; air and stool are noted within the colon. The sacroiliac joints are within normal limits.  IMPRESSION: No evidence of fracture or subluxation along the lumbar spine.   Electronically Signed   By: Roanna Raider M.D.   On: 06/15/2015 21:28   Ct Head Wo Contrast  06/15/2015   ADDENDUM REPORT: 06/15/2015 22:56  ADDENDUM: CT - CERVICAL SPINE WITHOUT CONTRAST:  The vertebral column, pedicles and facet articulations are intact. There is no evidence of acute fracture. No acute soft tissue abnormalities are evident.  There are moderately severe degenerative disc changes with small posterior osteophytes at multiple levels. The thyroid goiter is again evident. It is incompletely imaged.  IMPRESSION: 1. Negative for acute cervical spine fracture 2. Incompletely imaged thyroid goiter. This was evaluated with ultrasonography on 12/11/2012.   Electronically Signed   By: Ellery Plunk M.D.   On: 06/15/2015 22:56   06/15/2015   CLINICAL DATA:  Larey Seat from wheelchair to carpeted floor. Abrasion above the right eye. Patient was seen yesterday for a different fall.  EXAM: CT HEAD WITHOUT CONTRAST  TECHNIQUE: Contiguous axial images were obtained from the base of the skull through the vertex without intravenous contrast.  COMPARISON:  06/14/2015, 07/04/2012.  FINDINGS: There is no intracranial hemorrhage. There is agenesis of the corpus callosum and a dorsal interhemispheric cyst extending to the left. There is new hypodensity in the  medial right occipital lobe which likely represents cytotoxic edema associated with a subacute infarction. No other significant interval changes are evident. There is mild mass effect on the right occipital horn. There is no midline shift.  There is a right frontal scalp hematoma. Calvarium and skullbase are intact.  IMPRESSION: 1. Subacute nonhemorrhagic right occipital infarction. No midline shift. Mild mass effect on the right occipital horn. 2. Congenital agenesis of the corpus callosum with a dorsal interhemispheric cyst extending to the left. 3. Right frontal scalp hematoma. No evidence of acute intracranial traumatic injury.  Electronically Signed: By: Ellery Plunk M.D. On: 06/15/2015 21:21   Ct Head Wo Contrast  06/14/2015   CLINICAL DATA:  [facility], patient denies loss of consciousness  EXAM: CT HEAD WITHOUT CONTRAST  TECHNIQUE: Contiguous axial images were obtained from the base of the skull through the vertex without intravenous contrast.  COMPARISON:  MRI performed 07/04/2012  FINDINGS: Again identified is agenesis of the corpus callosum with colpocephaly of the ventricles. There is a large dorsal interhemispheric cyst projecting to the left of midline. These findings are stable. There is cerebellar atrophy which is unchanged. There is mild low attenuation in the periventricular white matter, similar to prior study. No evidence of hemorrhage or extra-axial fluid. No skull fracture.  IMPRESSION: Stable chronic ventricular dilatation and colpocephaly, with chronic age-related involutional change and no acute findings.   Electronically Signed   By: Esperanza Heir M.D.   On: 06/14/2015 16:37   Ct Angio Neck W/cm &/or Wo/cm  06/17/2015   CLINICAL DATA:  Recent fall.  Visual changes.  EXAM: CT ANGIOGRAPHY HEAD AND NECK  TECHNIQUE: Multidetector CT imaging of the head and neck was performed using the standard protocol during bolus administration of  intravenous contrast. Multiplanar CT image reconstructions and MIPs were obtained to evaluate the vascular anatomy. Carotid stenosis measurements (when applicable) are obtained utilizing NASCET criteria, using the distal internal carotid diameter as the denominator.  CONTRAST:  50mL OMNIPAQUE IOHEXOL 350 MG/ML SOLN  COMPARISON:  MRI brain 06/16/2015. CT head without contrast 06/15/2015.  FINDINGS: CT HEAD  Brain: The medial right occipital lobe infarct is again noted. There is no evidence for hemorrhage. Left ACA territory encephalomalacia is stable. A prominent left hemispheric cyst is again seen with mass effect.  Right supraorbital scalp soft tissue swelling is evident.  Calvarium and skull base: The calvarium is intact. Skullbase is unremarkable.  Paranasal sinuses: Clear  Orbits: The globes and orbits are intact.  CTA NECK  Aortic arch: A 3 vessel arch configuration is present without significant atherosclerotic calcification or stenosis.  Right carotid system: The right common carotid artery is within normal limits. Mild atherosclerotic changes are noted at the right carotid bifurcation without significant stenosis. The more distal cervical right ICA is within normal limits.  Left carotid system: The left common carotid artery is within normal limits. Atherosclerotic changes are present at the bifurcation without a significant stenosis relative to the more distal vessel. The cervical left ICA is within normal limits.  Vertebral arteries:The vertebral arteries originate from the subclavian arteries bilaterally. The right vertebral artery is the dominant vessel. There are no significant stenoses or evidence for focal injury within the vertebral arteries of the neck.  Skeleton: Multilevel endplate degenerative change is present. Prominent endplate osteophyte formation and uncovertebral spurring is most pronounced at C3-4 and C5-6. Multilevel endplate sclerotic changes are degenerative. No other focal lytic or  blastic lesions are evident.  Other neck: Asymmetric left-sided thyroid goiter is again noted without significant interval change.  CTA HEAD  Anterior circulation: Minimal atherosclerotic calcifications are present within the cavernous internal carotid arteries bilaterally without significant stenosis. The A1 and M1 segments are within normal limits. The right A1 segment is dominant. The anterior communicating artery is patent. The MCA bifurcations are intact. There is moderate attenuation of more distal MCA branch vessels bilaterally, particularly on the left. ACA branch vessels are displaced by the midline cyst.  Posterior circulation: The vertebral arteries are within normal limits bilaterally. PICA origins are visualized and normal. Both posterior cerebral arteries originate from the basilar tip. A prominent right posterior communicating artery contributes. Moderate attenuation of PCA branch vessels is  evident bilaterally. There is a moderate focal right P2 segment stenosis of 50-70% relative to the more distal vessel.  Venous sinuses: The dural sinuses are patent. The right transverse sinus is dominant. The straight sinus and deep cerebral veins are patent. Cortical veins are intact.  Anatomic variants: None  Delayed phase: The postcontrast images demonstrate no pathologic enhancement.  IMPRESSION: 1. Moderate proximal right PCA stenosis corresponds with the right occipital lobe infarct. 2. No significant change in the size the right occipital lobe infarct or hemorrhage. 3. Remote encephalomalacia of the anterior left frontal lobe. 4. Midline cyst on the left with mass effect as described. 5. Mild diffuse small vessel disease evident on the MRA. 6. Multilevel degenerative changes throughout the cervical spine are most pronounced at C3-4 and C5-6. 7. Atherosclerotic changes within the carotid bifurcations bilaterally without significant stenosis.   Electronically Signed   By: Marin Roberts M.D.   On:  06/17/2015 09:48   Ct Cervical Spine Wo Contrast  06/15/2015   ADDENDUM REPORT: 06/15/2015 22:56  ADDENDUM: CT - CERVICAL SPINE WITHOUT CONTRAST:  The vertebral column, pedicles and facet articulations are intact. There is no evidence of acute fracture. No acute soft tissue abnormalities are evident.  There are moderately severe degenerative disc changes with small posterior osteophytes at multiple levels. The thyroid goiter is again evident. It is incompletely imaged.  IMPRESSION: 1. Negative for acute cervical spine fracture 2. Incompletely imaged thyroid goiter. This was evaluated with ultrasonography on 12/11/2012.   Electronically Signed   By: Ellery Plunk M.D.   On: 06/15/2015 22:56   06/15/2015   CLINICAL DATA:  Larey Seat from wheelchair to carpeted floor. Abrasion above the right eye. Patient was seen yesterday for a different fall.  EXAM: CT HEAD WITHOUT CONTRAST  TECHNIQUE: Contiguous axial images were obtained from the base of the skull through the vertex without intravenous contrast.  COMPARISON:  06/14/2015, 07/04/2012.  FINDINGS: There is no intracranial hemorrhage. There is agenesis of the corpus callosum and a dorsal interhemispheric cyst extending to the left. There is new hypodensity in the medial right occipital lobe which likely represents cytotoxic edema associated with a subacute infarction. No other significant interval changes are evident. There is mild mass effect on the right occipital horn. There is no midline shift.  There is a right frontal scalp hematoma. Calvarium and skullbase are intact.  IMPRESSION: 1. Subacute nonhemorrhagic right occipital infarction. No midline shift. Mild mass effect on the right occipital horn. 2. Congenital agenesis of the corpus callosum with a dorsal interhemispheric cyst extending to the left. 3. Right frontal scalp hematoma. No evidence of acute intracranial traumatic injury.  Electronically Signed: By: Ellery Plunk M.D. On: 06/15/2015 21:21   Mr  Brain Wo Contrast  06/16/2015   CLINICAL DATA:  Initial evaluation for acute confusion, altered mental status. Recent fall.  EXAM: MRI HEAD WITHOUT CONTRAST  TECHNIQUE: Multiplanar, multiecho pulse sequences of the brain and surrounding structures were obtained without intravenous contrast.  COMPARISON:  Prior CT from 06/15/2015 as well as previous MRI from 07/04/2012.  FINDINGS: Study is limited as the patient was unable to tolerate the full length of the exam. Only diffusion weighted sequences and sagittal T1 weighted sequence were performed.  Axial diffusion-weighted sequence demonstrates abnormal restricted diffusion involving the medial right occipital lobe, consistent with acute right PCA territory infarct. No significant mass effect.  Additional tiny 6 mm ischemic infarct within the left occipital lobe (series 3, image 12).  Agenesis of the corpus  callosum with colpocephaly and dorsal left-sided interhemispheric cyst again noted, stable. No other definite acute abnormality.  IMPRESSION: 1. Limited study with only diffusion-weighted sequences performed. 2. Acute ischemic right PCA territory infarct without significant mass effect. 3. Additional small 6 mm acute ischemic infarct within the left occipital lobe. 4. 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 5. Small right frontal scalp contusion.   Electronically Signed   By: Rise Mu M.D.   On: 06/16/2015 01:47     Microbiology: No results found for this or any previous visit (from the past 240 hour(s)).   Labs: Basic Metabolic Panel:  Recent Labs Lab 06/15/15 2130 06/17/15 0749 06/20/15 0820 06/21/15 0748  NA 141 138 140 138  K 4.1 3.5 3.4* 3.3*  CL 101 99* 104 101  CO2 29 28 26 25   GLUCOSE 133* 114* 108* 109*  BUN 20 12 8 8   CREATININE 1.14 0.99 0.91 0.94  CALCIUM 9.4 8.5* 8.6* 8.7*  MG  --   --  1.3*  --    Liver Function Tests:  Recent Labs Lab 06/16/15 0212  06/18/15 1124  AST 27  --   ALT 17  --   ALKPHOS 125  --   BILITOT 0.7  --   PROT 6.9  --   ALBUMIN 3.8 3.6   No results for input(s): LIPASE, AMYLASE in the last 168 hours. No results for input(s): AMMONIA in the last 168 hours. CBC:  Recent Labs Lab 06/15/15 2130 06/17/15 0749 06/20/15 0820  WBC 11.0* 9.2 7.6  NEUTROABS 7.3  --   --   HGB 12.8* 12.4* 11.5*  HCT 36.5* 35.1* 32.6*  MCV 88.0 88.0 86.9  PLT 141* 139* 146*   Cardiac Enzymes:  Recent Labs Lab 06/15/15 2130  TROPONINI <0.03   BNP: Invalid input(s): POCBNP CBG:  Recent Labs Lab 06/21/15 0645 06/21/15 1126 06/21/15 1656 06/21/15 2112 06/22/15 0637  GLUCAP 112* 226* 119* 119* 107*    Time coordinating discharge:  Greater than 30 minutes  Signed:  Mikaia Janvier, DO [facility] Pager: [phone] 06/22/2015, 10:26 AM

## 2015-06-22 NOTE — Clinical Social Work Note (Signed)
CSW called the pt's sister Jasmine December. CSW introduce self and purpose of call. CSW informed Jasmine December that the pt will be discharge back to [facility] today. CSW and Jasmine December discussed ambulance transport. CSW contact [facility] at [phone] to schedule transport for the pt.CSW upload the pt's discharge summary. Bedside RN provided number to call reported.   Vinton Layson, MSW, LCSWA [phone]

## 2015-06-24 ENCOUNTER — Ambulatory Visit (INDEPENDENT_AMBULATORY_CARE_PROVIDER_SITE_OTHER): Payer: Medicare Other

## 2015-06-24 DIAGNOSIS — I639 Cerebral infarction, unspecified: Secondary | ICD-10-CM | POA: Diagnosis not present

## 2015-06-24 NOTE — Progress Notes (Unsigned)
Monitor placement, 30 days.

## 2015-06-24 NOTE — Patient Instructions (Addendum)
Cardiac Event Monitoring A cardiac event monitor is a small recording device used to help detect abnormal heart rhythms (arrhythmias). The monitor is used to record heart rhythm when noticeable symptoms such as the following occur:  Fast heartbeats (palpitations), such as heart racing or fluttering.  Dizziness.  Fainting or light-headedness.  Unexplained weakness. The monitor is wired to two electrodes placed on your chest. Electrodes are flat, sticky disks that attach to your skin. The monitor can be worn for up to 30 days. You will wear the monitor at all times, except when bathing.  HOW TO USE YOUR CARDIAC EVENT MONITOR A technician will prepare your chest for the electrode placement. The technician will show you how to place the electrodes, how to work the monitor, and how to replace the batteries. Take time to practice using the monitor before you leave the office. Make sure you understand how to send the information from the monitor to your health care provider. This requires a telephone with a landline, not a cell phone. You need to:  Wear your monitor at all times, except when you are in water:  Do not get the monitor wet.  Take the monitor off when bathing. Do not swim or use a hot tub with it on.  Keep your skin clean. Do not put body lotion or moisturizer on your chest.  Change the electrodes daily or any time they stop sticking to your skin. You might need to use tape to keep them on.  It is possible that your skin under the electrodes could become irritated. To keep this from happening, try to put the electrodes in slightly different places on your chest. However, they must remain in the area under your left breast and in the upper right section of your chest.  Make sure the monitor is safely clipped to your clothing or in a location close to your body that your health care provider recommends.  Press the button to record when you feel symptoms of heart trouble, such as  dizziness, weakness, light-headedness, palpitations, thumping, shortness of breath, unexplained weakness, or a fluttering or racing heart. The monitor is always on and records what happened slightly before you pressed the button, so do not worry about being too late to get good information.  Keep a diary of your activities, such as walking, doing chores, and taking medicine. It is especially important to note what you were doing when you pushed the button to record your symptoms. This will help your health care provider determine what might be contributing to your symptoms. The information stored in your monitor will be reviewed by your health care provider alongside your diary entries.  Send the recorded information as recommended by your health care provider. It is important to understand that it will take some time for your health care provider to process the results.  Change the batteries as recommended by your health care provider. SEEK IMMEDIATE MEDICAL CARE IF:   You have chest pain.  You have extreme difficulty breathing or shortness of breath.  You develop a very fast heartbeat that persists.  You develop dizziness that does not go away.  You faint or constantly feel you are about to faint. Document Released: 08/08/2008 Document Revised: 03/16/2014 Document Reviewed: 04/28/2013 Exit[employer] Patient Information 2015 [employer]. This information is not intended to replace advice given to you by your health care provider. Make sure you discuss any questions you have with your health care provider.  

## 2015-06-25 NOTE — Telephone Encounter (Signed)
Close encounter 

## 2015-06-26 DIAGNOSIS — M6281 Muscle weakness (generalized): Secondary | ICD-10-CM | POA: Diagnosis not present

## 2015-06-26 DIAGNOSIS — Z9181 History of falling: Secondary | ICD-10-CM | POA: Diagnosis not present

## 2015-06-26 DIAGNOSIS — E119 Type 2 diabetes mellitus without complications: Secondary | ICD-10-CM | POA: Diagnosis not present

## 2015-06-26 DIAGNOSIS — I69398 Other sequelae of cerebral infarction: Secondary | ICD-10-CM | POA: Diagnosis not present

## 2015-06-26 DIAGNOSIS — R569 Unspecified convulsions: Secondary | ICD-10-CM | POA: Diagnosis not present

## 2015-06-26 DIAGNOSIS — I10 Essential (primary) hypertension: Secondary | ICD-10-CM | POA: Diagnosis not present

## 2015-06-29 DIAGNOSIS — I10 Essential (primary) hypertension: Secondary | ICD-10-CM | POA: Diagnosis not present

## 2015-06-29 DIAGNOSIS — I69398 Other sequelae of cerebral infarction: Secondary | ICD-10-CM | POA: Diagnosis not present

## 2015-06-29 DIAGNOSIS — E119 Type 2 diabetes mellitus without complications: Secondary | ICD-10-CM | POA: Diagnosis not present

## 2015-06-29 DIAGNOSIS — M6281 Muscle weakness (generalized): Secondary | ICD-10-CM | POA: Diagnosis not present

## 2015-06-29 DIAGNOSIS — R569 Unspecified convulsions: Secondary | ICD-10-CM | POA: Diagnosis not present

## 2015-06-29 DIAGNOSIS — Z9181 History of falling: Secondary | ICD-10-CM | POA: Diagnosis not present

## 2015-06-30 ENCOUNTER — Encounter: Payer: BLUE CROSS/BLUE SHIELD | Admitting: Internal Medicine

## 2015-06-30 DIAGNOSIS — Z9181 History of falling: Secondary | ICD-10-CM | POA: Diagnosis not present

## 2015-06-30 DIAGNOSIS — I69398 Other sequelae of cerebral infarction: Secondary | ICD-10-CM | POA: Diagnosis not present

## 2015-06-30 DIAGNOSIS — I10 Essential (primary) hypertension: Secondary | ICD-10-CM | POA: Diagnosis not present

## 2015-06-30 DIAGNOSIS — R569 Unspecified convulsions: Secondary | ICD-10-CM | POA: Diagnosis not present

## 2015-06-30 DIAGNOSIS — M6281 Muscle weakness (generalized): Secondary | ICD-10-CM | POA: Diagnosis not present

## 2015-06-30 DIAGNOSIS — E119 Type 2 diabetes mellitus without complications: Secondary | ICD-10-CM | POA: Diagnosis not present

## 2015-07-01 DIAGNOSIS — I10 Essential (primary) hypertension: Secondary | ICD-10-CM | POA: Diagnosis not present

## 2015-07-01 DIAGNOSIS — Z9181 History of falling: Secondary | ICD-10-CM | POA: Diagnosis not present

## 2015-07-01 DIAGNOSIS — E119 Type 2 diabetes mellitus without complications: Secondary | ICD-10-CM | POA: Diagnosis not present

## 2015-07-01 DIAGNOSIS — I69398 Other sequelae of cerebral infarction: Secondary | ICD-10-CM | POA: Diagnosis not present

## 2015-07-01 DIAGNOSIS — M6281 Muscle weakness (generalized): Secondary | ICD-10-CM | POA: Diagnosis not present

## 2015-07-01 DIAGNOSIS — R569 Unspecified convulsions: Secondary | ICD-10-CM | POA: Diagnosis not present

## 2015-07-02 DIAGNOSIS — I10 Essential (primary) hypertension: Secondary | ICD-10-CM | POA: Diagnosis not present

## 2015-07-02 DIAGNOSIS — R569 Unspecified convulsions: Secondary | ICD-10-CM | POA: Diagnosis not present

## 2015-07-02 DIAGNOSIS — E119 Type 2 diabetes mellitus without complications: Secondary | ICD-10-CM | POA: Diagnosis not present

## 2015-07-02 DIAGNOSIS — I69398 Other sequelae of cerebral infarction: Secondary | ICD-10-CM | POA: Diagnosis not present

## 2015-07-02 DIAGNOSIS — M6281 Muscle weakness (generalized): Secondary | ICD-10-CM | POA: Diagnosis not present

## 2015-07-02 DIAGNOSIS — Z9181 History of falling: Secondary | ICD-10-CM | POA: Diagnosis not present

## 2015-07-05 DIAGNOSIS — Z9181 History of falling: Secondary | ICD-10-CM | POA: Diagnosis not present

## 2015-07-05 DIAGNOSIS — I10 Essential (primary) hypertension: Secondary | ICD-10-CM | POA: Diagnosis not present

## 2015-07-05 DIAGNOSIS — M6281 Muscle weakness (generalized): Secondary | ICD-10-CM | POA: Diagnosis not present

## 2015-07-05 DIAGNOSIS — I69398 Other sequelae of cerebral infarction: Secondary | ICD-10-CM | POA: Diagnosis not present

## 2015-07-05 DIAGNOSIS — E119 Type 2 diabetes mellitus without complications: Secondary | ICD-10-CM | POA: Diagnosis not present

## 2015-07-05 DIAGNOSIS — R569 Unspecified convulsions: Secondary | ICD-10-CM | POA: Diagnosis not present

## 2015-07-06 DIAGNOSIS — I638 Other cerebral infarction: Secondary | ICD-10-CM | POA: Diagnosis not present

## 2015-07-06 DIAGNOSIS — I10 Essential (primary) hypertension: Secondary | ICD-10-CM | POA: Diagnosis not present

## 2015-07-06 DIAGNOSIS — R569 Unspecified convulsions: Secondary | ICD-10-CM | POA: Diagnosis not present

## 2015-07-06 DIAGNOSIS — E785 Hyperlipidemia, unspecified: Secondary | ICD-10-CM | POA: Diagnosis not present

## 2015-07-06 DIAGNOSIS — M6281 Muscle weakness (generalized): Secondary | ICD-10-CM | POA: Diagnosis not present

## 2015-07-06 DIAGNOSIS — Z6827 Body mass index (BMI) 27.0-27.9, adult: Secondary | ICD-10-CM | POA: Diagnosis not present

## 2015-07-06 DIAGNOSIS — Z9181 History of falling: Secondary | ICD-10-CM | POA: Diagnosis not present

## 2015-07-06 DIAGNOSIS — E119 Type 2 diabetes mellitus without complications: Secondary | ICD-10-CM | POA: Diagnosis not present

## 2015-07-06 DIAGNOSIS — I69398 Other sequelae of cerebral infarction: Secondary | ICD-10-CM | POA: Diagnosis not present

## 2015-07-07 DIAGNOSIS — Z9181 History of falling: Secondary | ICD-10-CM | POA: Diagnosis not present

## 2015-07-07 DIAGNOSIS — M6281 Muscle weakness (generalized): Secondary | ICD-10-CM | POA: Diagnosis not present

## 2015-07-07 DIAGNOSIS — I10 Essential (primary) hypertension: Secondary | ICD-10-CM | POA: Diagnosis not present

## 2015-07-07 DIAGNOSIS — E119 Type 2 diabetes mellitus without complications: Secondary | ICD-10-CM | POA: Diagnosis not present

## 2015-07-07 DIAGNOSIS — I69398 Other sequelae of cerebral infarction: Secondary | ICD-10-CM | POA: Diagnosis not present

## 2015-07-07 DIAGNOSIS — R569 Unspecified convulsions: Secondary | ICD-10-CM | POA: Diagnosis not present

## 2015-07-08 DIAGNOSIS — R569 Unspecified convulsions: Secondary | ICD-10-CM | POA: Diagnosis not present

## 2015-07-08 DIAGNOSIS — E119 Type 2 diabetes mellitus without complications: Secondary | ICD-10-CM | POA: Diagnosis not present

## 2015-07-08 DIAGNOSIS — I69398 Other sequelae of cerebral infarction: Secondary | ICD-10-CM | POA: Diagnosis not present

## 2015-07-08 DIAGNOSIS — Z9181 History of falling: Secondary | ICD-10-CM | POA: Diagnosis not present

## 2015-07-08 DIAGNOSIS — I10 Essential (primary) hypertension: Secondary | ICD-10-CM | POA: Diagnosis not present

## 2015-07-08 DIAGNOSIS — M6281 Muscle weakness (generalized): Secondary | ICD-10-CM | POA: Diagnosis not present

## 2015-07-12 ENCOUNTER — Encounter: Payer: Self-pay | Admitting: Cardiology

## 2015-07-12 DIAGNOSIS — E119 Type 2 diabetes mellitus without complications: Secondary | ICD-10-CM | POA: Diagnosis not present

## 2015-07-12 DIAGNOSIS — Z9181 History of falling: Secondary | ICD-10-CM | POA: Diagnosis not present

## 2015-07-12 DIAGNOSIS — R569 Unspecified convulsions: Secondary | ICD-10-CM | POA: Diagnosis not present

## 2015-07-12 DIAGNOSIS — M6281 Muscle weakness (generalized): Secondary | ICD-10-CM | POA: Diagnosis not present

## 2015-07-12 DIAGNOSIS — I10 Essential (primary) hypertension: Secondary | ICD-10-CM | POA: Diagnosis not present

## 2015-07-12 DIAGNOSIS — I69398 Other sequelae of cerebral infarction: Secondary | ICD-10-CM | POA: Diagnosis not present

## 2015-07-14 DIAGNOSIS — Z9181 History of falling: Secondary | ICD-10-CM | POA: Diagnosis not present

## 2015-07-14 DIAGNOSIS — M6281 Muscle weakness (generalized): Secondary | ICD-10-CM | POA: Diagnosis not present

## 2015-07-14 DIAGNOSIS — I69398 Other sequelae of cerebral infarction: Secondary | ICD-10-CM | POA: Diagnosis not present

## 2015-07-14 DIAGNOSIS — I10 Essential (primary) hypertension: Secondary | ICD-10-CM | POA: Diagnosis not present

## 2015-07-14 DIAGNOSIS — E119 Type 2 diabetes mellitus without complications: Secondary | ICD-10-CM | POA: Diagnosis not present

## 2015-07-14 DIAGNOSIS — R569 Unspecified convulsions: Secondary | ICD-10-CM | POA: Diagnosis not present

## 2015-07-20 ENCOUNTER — Ambulatory Visit: Payer: BLUE CROSS/BLUE SHIELD | Admitting: Cardiology

## 2015-07-20 DIAGNOSIS — E119 Type 2 diabetes mellitus without complications: Secondary | ICD-10-CM | POA: Diagnosis not present

## 2015-07-20 DIAGNOSIS — I10 Essential (primary) hypertension: Secondary | ICD-10-CM | POA: Diagnosis not present

## 2015-07-20 DIAGNOSIS — M6281 Muscle weakness (generalized): Secondary | ICD-10-CM | POA: Diagnosis not present

## 2015-07-20 DIAGNOSIS — I69398 Other sequelae of cerebral infarction: Secondary | ICD-10-CM | POA: Diagnosis not present

## 2015-07-20 DIAGNOSIS — Z9181 History of falling: Secondary | ICD-10-CM | POA: Diagnosis not present

## 2015-07-20 DIAGNOSIS — R569 Unspecified convulsions: Secondary | ICD-10-CM | POA: Diagnosis not present

## 2015-07-21 DIAGNOSIS — E119 Type 2 diabetes mellitus without complications: Secondary | ICD-10-CM | POA: Diagnosis not present

## 2015-07-21 DIAGNOSIS — R569 Unspecified convulsions: Secondary | ICD-10-CM | POA: Diagnosis not present

## 2015-07-21 DIAGNOSIS — I10 Essential (primary) hypertension: Secondary | ICD-10-CM | POA: Diagnosis not present

## 2015-07-21 DIAGNOSIS — I69398 Other sequelae of cerebral infarction: Secondary | ICD-10-CM | POA: Diagnosis not present

## 2015-07-21 DIAGNOSIS — Z9181 History of falling: Secondary | ICD-10-CM | POA: Diagnosis not present

## 2015-07-21 DIAGNOSIS — M6281 Muscle weakness (generalized): Secondary | ICD-10-CM | POA: Diagnosis not present

## 2015-07-22 DIAGNOSIS — M6281 Muscle weakness (generalized): Secondary | ICD-10-CM | POA: Diagnosis not present

## 2015-07-22 DIAGNOSIS — I69398 Other sequelae of cerebral infarction: Secondary | ICD-10-CM | POA: Diagnosis not present

## 2015-07-22 DIAGNOSIS — E119 Type 2 diabetes mellitus without complications: Secondary | ICD-10-CM | POA: Diagnosis not present

## 2015-07-22 DIAGNOSIS — Z9181 History of falling: Secondary | ICD-10-CM | POA: Diagnosis not present

## 2015-07-22 DIAGNOSIS — R569 Unspecified convulsions: Secondary | ICD-10-CM | POA: Diagnosis not present

## 2015-07-22 DIAGNOSIS — I10 Essential (primary) hypertension: Secondary | ICD-10-CM | POA: Diagnosis not present

## 2015-07-23 DIAGNOSIS — E119 Type 2 diabetes mellitus without complications: Secondary | ICD-10-CM | POA: Diagnosis not present

## 2015-07-23 DIAGNOSIS — R569 Unspecified convulsions: Secondary | ICD-10-CM | POA: Diagnosis not present

## 2015-07-23 DIAGNOSIS — Z9181 History of falling: Secondary | ICD-10-CM | POA: Diagnosis not present

## 2015-07-23 DIAGNOSIS — I10 Essential (primary) hypertension: Secondary | ICD-10-CM | POA: Diagnosis not present

## 2015-07-23 DIAGNOSIS — I69398 Other sequelae of cerebral infarction: Secondary | ICD-10-CM | POA: Diagnosis not present

## 2015-07-23 DIAGNOSIS — M6281 Muscle weakness (generalized): Secondary | ICD-10-CM | POA: Diagnosis not present

## 2015-07-26 DIAGNOSIS — M6281 Muscle weakness (generalized): Secondary | ICD-10-CM | POA: Diagnosis not present

## 2015-07-26 DIAGNOSIS — I69398 Other sequelae of cerebral infarction: Secondary | ICD-10-CM | POA: Diagnosis not present

## 2015-07-26 DIAGNOSIS — R569 Unspecified convulsions: Secondary | ICD-10-CM | POA: Diagnosis not present

## 2015-07-26 DIAGNOSIS — I10 Essential (primary) hypertension: Secondary | ICD-10-CM | POA: Diagnosis not present

## 2015-07-26 DIAGNOSIS — Z9181 History of falling: Secondary | ICD-10-CM | POA: Diagnosis not present

## 2015-07-26 DIAGNOSIS — E119 Type 2 diabetes mellitus without complications: Secondary | ICD-10-CM | POA: Diagnosis not present

## 2015-07-28 ENCOUNTER — Encounter: Payer: Self-pay | Admitting: Cardiology

## 2015-07-28 DIAGNOSIS — E119 Type 2 diabetes mellitus without complications: Secondary | ICD-10-CM | POA: Diagnosis not present

## 2015-07-28 DIAGNOSIS — Z9181 History of falling: Secondary | ICD-10-CM | POA: Diagnosis not present

## 2015-07-28 DIAGNOSIS — R569 Unspecified convulsions: Secondary | ICD-10-CM | POA: Diagnosis not present

## 2015-07-28 DIAGNOSIS — M6281 Muscle weakness (generalized): Secondary | ICD-10-CM | POA: Diagnosis not present

## 2015-07-28 DIAGNOSIS — I69398 Other sequelae of cerebral infarction: Secondary | ICD-10-CM | POA: Diagnosis not present

## 2015-07-28 DIAGNOSIS — I10 Essential (primary) hypertension: Secondary | ICD-10-CM | POA: Diagnosis not present

## 2015-08-05 DIAGNOSIS — M79672 Pain in left foot: Secondary | ICD-10-CM | POA: Diagnosis not present

## 2015-08-05 DIAGNOSIS — M79675 Pain in left toe(s): Secondary | ICD-10-CM | POA: Diagnosis not present

## 2015-08-05 DIAGNOSIS — B351 Tinea unguium: Secondary | ICD-10-CM | POA: Diagnosis not present

## 2015-08-09 ENCOUNTER — Ambulatory Visit (INDEPENDENT_AMBULATORY_CARE_PROVIDER_SITE_OTHER): Payer: Medicare Other | Admitting: Physician Assistant

## 2015-08-09 ENCOUNTER — Encounter: Payer: Self-pay | Admitting: Physician Assistant

## 2015-08-09 VITALS — BP 130/80 | HR 56 | Ht 70.0 in | Wt 186.7 lb

## 2015-08-09 DIAGNOSIS — I10 Essential (primary) hypertension: Secondary | ICD-10-CM

## 2015-08-09 DIAGNOSIS — R002 Palpitations: Secondary | ICD-10-CM

## 2015-08-09 DIAGNOSIS — I639 Cerebral infarction, unspecified: Secondary | ICD-10-CM

## 2015-08-09 NOTE — Progress Notes (Signed)
Cardiology Office Note   Date:  08/09/2015   ID:  Arthur Berry, DOB October 13, 1946, MRN [id number]  PCP:  [name], MD  Cardiologist:  Dr Luna Kitchens, PA-C   Chief Complaint  Patient presents with  . Follow-up    Event Monitor results:  No complaints of chest pain, SOB, edema or dizziness.    History of Present Illness: Arthur Berry is a 69 y.o. male with a history of HTN, HL, non-obs CAD 2014, DM and CVA 06/2015.   Arthur Berry presents for follow-up of the event monitor he wore after his CVA.  Arthur Berry is improving and rehabilitation. His brother is with him today and helps in his care. His family has assisted him and his care since he began having seizures as a child.  Arthur Berry gets occasional chest pain. His description of it is a bump and sometimes more than one thump in a row. He never gets lightheaded or dizzy with bumps. He never has more than 3 or 4 in a row that he can remember. He never has exertional chest pain. The pumps will come on spontaneously and resolved. After he gets a thump, he feels his heart slowed down for a second and then go back to normal.  He has no dyspnea on exertion and feels that he is making good progress in rehabilitation without any difficulty. He has a very positive attitude.  Past Medical History  Diagnosis Date  . Colon polyp 2005    Tubular Adenoma  . Hypertension 1999  . Arthritis   . Hyperlipidemia 1999  . Epilepsy   . Sleep apnea   . Chronic kidney disease   . Coronary artery disease, non-occlusive     Minimal nonobstructive CAD, nl LV systolic fxn by cath 12/11/12  . Goiter, nontoxic, multinodular     Thyroid US 11/2012  . Diabetes mellitus 2001    TYPE 2  . Neuromuscular disorder   . Splinter 04/27/13    metal plinter removed rt pointer finger  . GERD (gastroesophageal reflux disease) 04/23/2014  . Diverticulosis   . Internal hemorrhoids    Past Surgical History  Procedure Laterality Date  . Knee  surgery  2008    Left  . Cholecystectomy    . Inguinal hernia repair  06/27/11    left  . Esophagogastroduodenoscopy  10/14/2012    normal   . Colonoscopy  12/28/2009  . Left heart catheterization with coronary angiogram N/A 12/11/2012    Procedure: LEFT HEART CATHETERIZATION WITH CORONARY ANGIOGRAM;  Surgeon: Tonny Bollman, MD;  Location: [facility] CATH LAB;  Service: Cardiovascular;  Laterality: N/A;    Current Outpatient Prescriptions  Medication Sig Dispense Refill  . amLODipine (NORVASC) 5 MG tablet Take 5 mg by mouth every morning.     Marland Kitchen aspirin 325 MG tablet Take 1 tablet (325 mg total) by mouth daily. 30 tablet 0  . atorvastatin (LIPITOR) 40 MG tablet Take 40 mg by mouth daily.    . carvedilol (COREG) 6.25 MG tablet Take 3.125 mg by mouth 2 (two) times daily with a meal.     . escitalopram (LEXAPRO) 5 MG tablet Take 5 mg by mouth daily.    Marland Kitchen glyBURIDE-metformin (GLUCOVANCE) 2.5-500 MG per tablet Take 1 tablet by mouth 2 (two) times daily with a meal.    . levETIRAcetam (KEPPRA) 500 MG tablet Take 500 mg by mouth 2 (two) times daily.      Marland Kitchen lisinopril (PRINIVIL,ZESTRIL) 20  MG tablet Take 1 tablet (20 mg total) by mouth daily. 30 tablet 0  . pantoprazole (PROTONIX) 40 MG tablet Take 1 tablet (40 mg total) by mouth daily. 60 tablet 0  . phenytoin (DILANTIN) 100 MG ER capsule Take 3 capsules (300 mg total) by mouth daily. Start 06/23/2015 30 capsule 0  . Polyethyl Glycol-Propyl Glycol (SYSTANE ULTRA OP) Place 1 drop into both eyes daily.    . potassium chloride SA (K-DUR,KLOR-CON) 20 MEQ tablet Take 20 mEq by mouth daily.     . risperiDONE (RISPERDAL) 1 MG tablet Take 1 mg by mouth at bedtime.    . tamsulosin (FLOMAX) 0.4 MG CAPS capsule Take 0.4 mg by mouth daily after supper.     No current facility-administered medications for this visit.    Allergies:   Review of patient's allergies indicates no known allergies.    Social History:  The patient  reports that he quit smoking about 29  years ago. His smoking use included Cigars. He has never used smokeless tobacco. He reports that he does not drink alcohol or use illicit drugs.   Family History:  The patient's family history includes Alzheimer's disease in his father; Breast cancer in his sister; Colon cancer in an other family member; Diabetes in his sister; Heart disease in his mother.    ROS:  Please see the history of present illness. All other systems are reviewed and negative.   PHYSICAL EXAM: VS:  BP 130/80 mmHg  Pulse 56  Ht  (1.778 m)  Wt 186 lb 11.2 oz (84.687 kg)  BMI 26.79 kg/m2 , BMI Body mass index is 26.79 kg/(m^2). GEN: Well nourished, well developed, male in no acute distress HEENT: normal Neck: no JVD, carotid bruits, or masses Cardiac: RRR; no murmurs, rubs, or gallops, trace lower extremity edema  Respiratory:  clear to auscultation bilaterally, normal work of breathing GI: soft, nontender, nondistended, + BS MS: no deformity or atrophy; distal pulses are 2+ in all 4 extremities; trace edema  Skin: warm and dry, no rash Neuro:  Strength and sensation are intact Psych: euthymic mood, full affect   EKG:  EKG is ordered today. The ekg ordered today demonstrates sinus bradycardia, rate 66, no acute ischemic changes   Event Monitor 07/2015  Normal sinus rhythm  One PAC  6 beat run of NSVT  No atrial fibrillation noted.  Recent Labs: 06/16/2015: ALT 17 06/20/2015: Hemoglobin 11.5*; Magnesium 1.3*; Platelets 146* 06/22/2015: BUN 13; Creatinine, Ser 1.20; Potassium 3.7; Sodium 140    Lipid Panel    Component Value Date/Time   CHOL 101 06/16/2015 0212   TRIG 85 06/16/2015 0212   HDL 33* 06/16/2015 0212   CHOLHDL 3.1 06/16/2015 0212   VLDL 17 06/16/2015 0212   LDLCALC 51 06/16/2015 0212     Wt Readings from Last 3 Encounters:  08/09/15 186 lb 11.2 oz (84.687 kg)  06/09/14 195 lb (88.451 kg)  04/23/14 197 lb (89.359 kg)     Other studies Reviewed: Additional studies/ records  that were reviewed today include: Hospital records, previous ECG, cath report.  ASSESSMENT AND PLAN:  1.  Chest pain, palpitations: Arthur Berry has minimal palpitations which she describes as chest pain. He is asymptomatic with these. On his event monitor which he wore because of the CVA, he had a PAC and one 6 beat run of nonsustained VT but was asymptomatic. He had no atrial fibrillation.   he is on a beta blocker but because of his resting bradycardia,  we cannot increase the dose. Continue this.   2. Essential hypertension: His blood pressure is well controlled. No medication changes are indicated at this time.   Current medicines are reviewed at length with the patient today.  The patient does not have concerns regarding medicines.  The following changes have been made:  no change  Labs/ tests ordered today include:   Orders Placed This Encounter  Procedures  . EKG 12-Lead     Disposition:   FU with Dr. Antoine Poche in one year   Signed, Leanna Battles  08/09/2015 4:32 PM    [facility] [address] [place], [place], [place]  [address] Phone: [phone]; Fax: [phone]

## 2015-08-09 NOTE — Patient Instructions (Signed)
Your physician wants you to follow-up in: ONE YEAR WITH DR HOCHREIN You will receive a reminder letter in the mail two months in advance. If you don't receive a letter, please call our office to schedule the follow-up appointment.  

## 2015-08-13 DIAGNOSIS — E119 Type 2 diabetes mellitus without complications: Secondary | ICD-10-CM | POA: Diagnosis not present

## 2015-08-17 ENCOUNTER — Ambulatory Visit (INDEPENDENT_AMBULATORY_CARE_PROVIDER_SITE_OTHER): Payer: Medicare Other | Admitting: Diagnostic Neuroimaging

## 2015-08-17 ENCOUNTER — Encounter: Payer: Self-pay | Admitting: Diagnostic Neuroimaging

## 2015-08-17 VITALS — BP 106/59 | HR 71 | Ht 70.0 in | Wt 181.1 lb

## 2015-08-17 DIAGNOSIS — I639 Cerebral infarction, unspecified: Secondary | ICD-10-CM

## 2015-08-17 DIAGNOSIS — E1159 Type 2 diabetes mellitus with other circulatory complications: Secondary | ICD-10-CM

## 2015-08-17 DIAGNOSIS — E785 Hyperlipidemia, unspecified: Secondary | ICD-10-CM

## 2015-08-17 DIAGNOSIS — I63433 Cerebral infarction due to embolism of bilateral posterior cerebral arteries: Secondary | ICD-10-CM

## 2015-08-17 DIAGNOSIS — I10 Essential (primary) hypertension: Secondary | ICD-10-CM

## 2015-08-17 NOTE — Patient Instructions (Signed)
Thank you for coming to see [place] at [facility]. I hope we have been able to provide you high quality care today.  You may receive a patient satisfaction survey over the next few weeks. We would appreciate your feedback and comments so that we may continue to improve ourselves and the health of our patients.  - continue current medications   ~~~~~~~~~~~~~~~~~~~~~~~~~~~~~~~~~~~~~~~~~~~~~~~~~~~~~~~~~~~~~~~~~  DR. Khaiden Segreto'S GUIDE TO HAPPY AND HEALTHY LIVING These are some of my general health and wellness recommendations. Some of them may apply to you better than others. Please use common sense as you try these suggestions and feel free to ask me any questions.   ACTIVITY/FITNESS Mental, social, emotional and physical stimulation are very important for brain and body health. Try learning a new activity (arts, music, language, sports, games).  Keep moving your body to the best of your abilities.    NUTRITION Eat more plants: colorful vegetables, nuts, seeds and berries.  Eat less sugar, salt, preservatives and processed foods.  Avoid toxins such as cigarettes and alcohol.  Drink water when you are thirsty. Warm water with a slice of lemon is an excellent morning drink to start the day.  Consider these websites for more information [employer] Nutrition [employer] (https://www.henry-hernandez.biz/) [employer] Nutrition (WindowBlog.ch)  RELAXATION Consider practicing mindfulness meditation or other relaxation techniques such as deep breathing, prayer, yoga, tai chi, massage.   SLEEP Try to get at least 7-8+ hours sleep per day. Regular exercise and reduced caffeine will help you sleep better. Practice good sleep hygeine techniques. See website sleep.org for more information.  PLANNING Prepare estate planning, living will, healthcare POA documents. Sometimes this is best planned with the help of an attorney. Theconversationproject.org  and agingwithdignity.org are excellent resources.

## 2015-08-17 NOTE — Progress Notes (Signed)
[facility]  PATIENT: Arthur Berry DOB: 14-Feb-1946  REFERRING CLINICIAN: D Tat (hospital referral) HISTORY FROM: patient and transporter  REASON FOR VISIT: new consult / existing patient   HISTORICAL  CHIEF COMPLAINT:  Chief Complaint  Patient presents with  . Cerebrovascular Accident    rm 6 , hospital FU, care giver - Leeandra    HISTORY OF PRESENT ILLNESS:   UPDATE 08/17/15: Since last visit, patient had was admitted for syncope and stroke in Aug 2016. Records reviewed and appreciate stroke team and medical hospitalists for their assistance. Also had toxic dilantin level and now dilantin dosing adjusted. Now living at [facility]. No seizures. Overall doing well now. In good spirits.   UPDATE 07/31/12: Retired in May 2013. June 2013, had episode at home of fall, trapped in recliner x 2 days, and subsequent rhabdo and dehydration. was at [facility] x 4 weeks, now back home.  Over past 2-3 months, progressive gait decline, urinary incontinence, and cognitive decline. Not using CPAP machine x 3 months (out of filters). Has had low dilantin levels, although he says he was taking meds. Living alone. Continues to drive, inspite of high epworth scores and recommendation not to drive per Dr. Vickey Huger due to hypersomnia.   UPDATE 02/06/12: Since last visit, co-workers have noticed patient "falling asleep" on the job. No one available to talk to. Unclear if pts eyes open or closed during spells. Pt doesn't feel like he falls asleep. Has insomnia durign weekdays, but not during weekends. More stress related to job.  UPDATE 08/25/11: Doing well; no seizures.  Back to driving, but careful when he is tired (will ask others to drive).  Tolerating LEV and DPH.    PRIOR HPI:  69 year old left-handed male with history of seizures since the age of 69 years old, presenting for intermittent episodes of zoning out, difficulty with concentration and memory problems for the  past 2-3 months.  Patient is accompanied by his sister today. Patient had a normal birth and development.  He was diagnosed with seizures at the age of 69 years old years old. He was started on chlordiazepoxide and phenytoin at age of 37 or 69 years old. He did have some difficulty with school having to repeat the fifth, sixth, seventh and eighth grades.  He ultimately graduated with a GED. Patient's typical seizures in the past involved distortion of sound were things felt like they were "miles away". He reports generalized convulsions where he was able to hear people around him but unable to speak. He denies tongue biting or incontinence. His last reported seizure was in 1987. Over the past 2-3 months he has been having episodes of "zoning out" at work. It had been happening 1- 2 times per week. He has no warning for these events.  He is unaware of these events. Reportedly coworkers called his name during these events he did not respond. On September 16 he was in a meeting and a coworker called his name, walked over and tapped his shoulder, he still did not respond for approximately 30 seconds. The patient's sister is asking if there are newer seizure medications which may have less side effects than chlordiazepoxide and phenytoin.  There has been some gradual difficulty with concentration, thinking and memory over the past several years.  REVIEW OF SYSTEMS: Full 14 system review of systems performed and notable only for double vision, diarrhea.  ALLERGIES: No Known Allergies  HOME MEDICATIONS: Outpatient Prescriptions Prior to Visit  Medication Sig Dispense Refill  .  amLODipine (NORVASC) 5 MG tablet Take 5 mg by mouth every morning.     Marland Kitchen aspirin 325 MG tablet Take 1 tablet (325 mg total) by mouth daily. 30 tablet 0  . atorvastatin (LIPITOR) 40 MG tablet Take 40 mg by mouth daily.    . carvedilol (COREG) 6.25 MG tablet Take 3.125 mg by mouth 2 (two) times daily with a meal.     . escitalopram (LEXAPRO) 5 MG  tablet Take 5 mg by mouth daily.    Marland Kitchen glyBURIDE-metformin (GLUCOVANCE) 2.5-500 MG per tablet Take 1 tablet by mouth 2 (two) times daily with a meal.    . levETIRAcetam (KEPPRA) 500 MG tablet Take 500 mg by mouth 2 (two) times daily.      Marland Kitchen lisinopril (PRINIVIL,ZESTRIL) 20 MG tablet Take 1 tablet (20 mg total) by mouth daily. 30 tablet 0  . pantoprazole (PROTONIX) 40 MG tablet Take 1 tablet (40 mg total) by mouth daily. 60 tablet 0  . phenytoin (DILANTIN) 100 MG ER capsule Take 3 capsules (300 mg total) by mouth daily. Start 06/23/2015 30 capsule 0  . Polyethyl Glycol-Propyl Glycol (SYSTANE ULTRA OP) Place 1 drop into both eyes daily.    . potassium chloride SA (K-DUR,KLOR-CON) 20 MEQ tablet Take 20 mEq by mouth daily.     . risperiDONE (RISPERDAL) 1 MG tablet Take 1 mg by mouth at bedtime.    . tamsulosin (FLOMAX) 0.4 MG CAPS capsule Take 0.4 mg by mouth daily after supper.     No facility-administered medications prior to visit.    PAST MEDICAL HISTORY: Past Medical History  Diagnosis Date  . Colon polyp 2005    Tubular Adenoma  . Hypertension 1999  . Arthritis   . Hyperlipidemia 1999  . Epilepsy (HCC)   . Sleep apnea   . Chronic kidney disease   . Coronary artery disease, non-occlusive     Minimal nonobstructive CAD, nl LV systolic fxn by cath 12/11/12  . Goiter, nontoxic, multinodular     Thyroid US 11/2012  . Diabetes mellitus 2001    TYPE 2  . Neuromuscular disorder (HCC)   . Splinter 04/27/13    metal plinter removed rt pointer finger  . GERD (gastroesophageal reflux disease) 04/23/2014  . Diverticulosis   . Internal hemorrhoids   . Stroke (HCC) 06/2015  . Vision changes 06/2015    since CVA    PAST SURGICAL HISTORY: Past Surgical History  Procedure Laterality Date  . Knee surgery  2008    Left  . Cholecystectomy    . Inguinal hernia repair  06/27/11    left  . Esophagogastroduodenoscopy  10/14/2012    normal   . Colonoscopy  12/28/2009  . Left heart catheterization  with coronary angiogram N/A 12/11/2012    Procedure: LEFT HEART CATHETERIZATION WITH CORONARY ANGIOGRAM;  Surgeon: Tonny Bollman, MD;  Location: [facility] CATH LAB;  Service: Cardiovascular;  Laterality: N/A;    FAMILY HISTORY: Family History  Problem Relation Age of Onset  . Breast cancer Sister   . Colon cancer      unknown  . Diabetes Sister   . Heart disease Mother   . Alzheimer's disease Father     SOCIAL HISTORY:  Social History   Social History  . Marital Status: Divorced    Spouse Name: N/A  . Number of Children: 0  . Years of Education: N/A   Occupational History  . Entrance assistant [employer]   Social History Main Topics  . Smoking status: Former  Smoker    Types: Cigars    Quit date: 04/14/1986  . Smokeless tobacco: Never Used     Comment: Quit cigars in the 1980s  . Alcohol Use: No  . Drug Use: No  . Sexual Activity: No   Other Topics Concern  . Not on file   Social History Narrative     PHYSICAL EXAM  GENERAL EXAM/CONSTITUTIONAL: Vitals:  Filed Vitals:   08/17/15 1008  BP: 106/59  Pulse: 71  Height: 5\' 10"  (1.778 m)  Weight: 181 lb 1.6 oz (82.146 kg)     Body mass index is 25.99 kg/(m^2).  No exam data present  Patient is in no distress; well developed, nourished and groomed; neck is supple  CARDIOVASCULAR:  Examination of carotid arteries is normal; no carotid bruits  Regular rate and rhythm, no murmurs  Examination of peripheral vascular system by observation and palpation is normal  EYES:  Ophthalmoscopic exam of optic discs and posterior segments is normal; no papilledema or hemorrhages  MUSCULOSKELETAL:  Gait, strength, tone, movements noted in Neurologic exam below  NEUROLOGIC: MENTAL STATUS:  No flowsheet data found.  awake, alert, oriented to person, place and time  recent and remote memory intact  normal attention and concentration  language fluent, comprehension intact, naming intact,   fund of knowledge  appropriate  CRANIAL NERVE:   2nd - no papilledema on fundoscopic exam  2nd, 3rd, 4th, 6th - pupils equal and reactive to light, visual fields --> LEFT HOMONYMOUS HEMIANOPSIA, extraocular muscles intact, no nystagmus  5th - facial sensation symmetric  7th - facial strength symmetric  8th - hearing intact  9th - palate elevates symmetrically, uvula midline  11th - shoulder shrug symmetric  12th - tongue protrusion midline  MOTOR:   normal bulk and tone, full strength in the BUE, BLE; SLIGHTLY SLOWER ON LEFT SIDE  SENSORY:   normal and symmetric to light touch, temperature, vibration  COORDINATION:   finger-nose-finger, fine finger movements normal  REFLEXES:   deep tendon reflexes TRACE and symmetric  GAIT/STATION:   narrow based gait; USES A WALKER    DIAGNOSTIC DATA (LABS, IMAGING, TESTING) - I reviewed patient records, labs, notes, testing and imaging myself where available.  Lab Results  Component Value Date   WBC 7.6 06/20/2015   HGB 11.5* 06/20/2015   HCT 32.6* 06/20/2015   MCV 86.9 06/20/2015   PLT 146* 06/20/2015      Component Value Date/Time   NA 140 06/22/2015 1027   K 3.7 06/22/2015 1027   CL 103 06/22/2015 1027   CO2 26 06/22/2015 1027   GLUCOSE 152* 06/22/2015 1027   BUN 13 06/22/2015 1027   CREATININE 1.20 06/22/2015 1027   CALCIUM 9.3 06/22/2015 1027   PROT 6.9 06/16/2015 0212   ALBUMIN 3.6 06/18/2015 1124   AST 27 06/16/2015 0212   ALT 17 06/16/2015 0212   ALKPHOS 125 06/16/2015 0212   BILITOT 0.7 06/16/2015 0212   GFRNONAA 60* 06/22/2015 1027   GFRAA >60 06/22/2015 1027   Lab Results  Component Value Date   CHOL 101 06/16/2015   HDL 33* 06/16/2015   LDLCALC 51 06/16/2015   TRIG 85 06/16/2015   CHOLHDL 3.1 06/16/2015   Lab Results  Component Value Date   HGBA1C 7.0* 06/16/2015   Lab Results  Component Value Date   VITAMINB12 487 06/20/2015   Lab Results  Component Value Date   TSH 0.800 12/12/2012    06/16/15  MRI brain [I reviewed images myself and  agree with interpretation. -VRP]  1. Limited study with only diffusion-weighted sequences performed. 2. Acute ischemic right PCA territory infarct without significant mass effect. 3. Additional small 6 mm acute ischemic infarct within the left occipital lobe. 4. 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 5. Small right frontal scalp contusion.  06/17/15 CTA head/neck [I reviewed images myself and agree with interpretation. -VRP]  1. Moderate proximal right PCA stenosis corresponds with the right occipital lobe infarct. 2. No significant change in the size the right occipital lobe infarct or hemorrhage. 3. Remote encephalomalacia of the anterior left frontal lobe. 4. Midline cyst on the left with mass effect as described. 5. Mild diffuse small vessel disease evident on the MRA. 6. Multilevel degenerative changes throughout the cervical spine are most pronounced at C3-4 and C5-6. 7. Atherosclerotic changes within the carotid bifurcations bilaterally without significant stenosis.  06/17/15 TTE - Left ventricle: The cavity size was normal. There was moderate concentric hypertrophy. Systolic function was normal. Theestimated ejection fraction was in the range of 60% to 65%. Wall motion was normal; there were no regional wall motionabnormalities. Doppler parameters are consistent with abnormalleft ventricular relaxation (grade 1 diastolic dysfunction).  06/24/15 cardiac monitor - On event monitor no PAF, some other fast HR but nothing to cause stroke.     ASSESSMENT AND PLAN  69 y.o. year old male here with new stroke (right > left PCA), possibly due to intracranial atherosclerosis vs cardio-embolic.   Dx:  Cerebrovascular accident (CVA) due to bilateral embolism of posterior cerebral arteries  Type 2 diabetes mellitus with other circulatory complications (HCC)  Essential hypertension,  benign  HLD (hyperlipidemia)    PLAN: - secondary stroke prevention (aspirin  daily, statin, BP control, diabetes control) - continue levetiracetam and dilantin for seizure prevention  Return in about 6 months (around 02/15/2016).    Suanne Marker, MD 08/17/2015, 10:31 AM Certified in Neurology, Neurophysiology and Neuroimaging  [facility] [address], [address] [place], [place] [address] [phone]

## 2015-08-18 DIAGNOSIS — E119 Type 2 diabetes mellitus without complications: Secondary | ICD-10-CM | POA: Diagnosis not present

## 2015-08-18 DIAGNOSIS — Z9181 History of falling: Secondary | ICD-10-CM | POA: Diagnosis not present

## 2015-08-18 DIAGNOSIS — I10 Essential (primary) hypertension: Secondary | ICD-10-CM | POA: Diagnosis not present

## 2015-08-18 DIAGNOSIS — M6281 Muscle weakness (generalized): Secondary | ICD-10-CM | POA: Diagnosis not present

## 2015-08-18 DIAGNOSIS — I69398 Other sequelae of cerebral infarction: Secondary | ICD-10-CM | POA: Diagnosis not present

## 2015-08-18 DIAGNOSIS — R569 Unspecified convulsions: Secondary | ICD-10-CM | POA: Diagnosis not present

## 2015-09-10 ENCOUNTER — Encounter (HOSPITAL_COMMUNITY): Payer: Self-pay | Admitting: Emergency Medicine

## 2015-09-10 ENCOUNTER — Emergency Department (HOSPITAL_COMMUNITY)
Admission: EM | Admit: 2015-09-10 | Discharge: 2015-09-11 | Disposition: A | Discharge status: Home / Self Care | Payer: Medicare Other | Attending: Emergency Medicine | Admitting: Emergency Medicine

## 2015-09-10 ENCOUNTER — Emergency Department (HOSPITAL_COMMUNITY): Payer: Medicare Other

## 2015-09-10 DIAGNOSIS — I129 Hypertensive chronic kidney disease with stage 1 through stage 4 chronic kidney disease, or unspecified chronic kidney disease: Secondary | ICD-10-CM | POA: Diagnosis not present

## 2015-09-10 DIAGNOSIS — G40909 Epilepsy, unspecified, not intractable, without status epilepticus: Secondary | ICD-10-CM | POA: Insufficient documentation

## 2015-09-10 DIAGNOSIS — K219 Gastro-esophageal reflux disease without esophagitis: Secondary | ICD-10-CM | POA: Diagnosis not present

## 2015-09-10 DIAGNOSIS — E785 Hyperlipidemia, unspecified: Secondary | ICD-10-CM | POA: Diagnosis not present

## 2015-09-10 DIAGNOSIS — Z9889 Other specified postprocedural states: Secondary | ICD-10-CM | POA: Diagnosis not present

## 2015-09-10 DIAGNOSIS — I2510 Atherosclerotic heart disease of native coronary artery without angina pectoris: Secondary | ICD-10-CM | POA: Insufficient documentation

## 2015-09-10 DIAGNOSIS — Y998 Other external cause status: Secondary | ICD-10-CM | POA: Insufficient documentation

## 2015-09-10 DIAGNOSIS — W01198A Fall on same level from slipping, tripping and stumbling with subsequent striking against other object, initial encounter: Secondary | ICD-10-CM | POA: Diagnosis not present

## 2015-09-10 DIAGNOSIS — Z7982 Long term (current) use of aspirin: Secondary | ICD-10-CM | POA: Insufficient documentation

## 2015-09-10 DIAGNOSIS — S098XXA Other specified injuries of head, initial encounter: Secondary | ICD-10-CM | POA: Diagnosis not present

## 2015-09-10 DIAGNOSIS — Z79899 Other long term (current) drug therapy: Secondary | ICD-10-CM | POA: Insufficient documentation

## 2015-09-10 DIAGNOSIS — Z86010 Personal history of colonic polyps: Secondary | ICD-10-CM | POA: Insufficient documentation

## 2015-09-10 DIAGNOSIS — Z8673 Personal history of transient ischemic attack (TIA), and cerebral infarction without residual deficits: Secondary | ICD-10-CM | POA: Diagnosis not present

## 2015-09-10 DIAGNOSIS — E119 Type 2 diabetes mellitus without complications: Secondary | ICD-10-CM | POA: Diagnosis not present

## 2015-09-10 DIAGNOSIS — Y9289 Other specified places as the place of occurrence of the external cause: Secondary | ICD-10-CM | POA: Insufficient documentation

## 2015-09-10 DIAGNOSIS — M1990 Unspecified osteoarthritis, unspecified site: Secondary | ICD-10-CM | POA: Diagnosis not present

## 2015-09-10 DIAGNOSIS — N189 Chronic kidney disease, unspecified: Secondary | ICD-10-CM | POA: Insufficient documentation

## 2015-09-10 DIAGNOSIS — Z87891 Personal history of nicotine dependence: Secondary | ICD-10-CM | POA: Insufficient documentation

## 2015-09-10 DIAGNOSIS — R51 Headache: Secondary | ICD-10-CM | POA: Diagnosis not present

## 2015-09-10 DIAGNOSIS — S7001XA Contusion of right hip, initial encounter: Secondary | ICD-10-CM | POA: Diagnosis not present

## 2015-09-10 DIAGNOSIS — Y9389 Activity, other specified: Secondary | ICD-10-CM | POA: Diagnosis not present

## 2015-09-10 DIAGNOSIS — S79912A Unspecified injury of left hip, initial encounter: Secondary | ICD-10-CM | POA: Insufficient documentation

## 2015-09-10 DIAGNOSIS — S0003XA Contusion of scalp, initial encounter: Secondary | ICD-10-CM | POA: Diagnosis not present

## 2015-09-10 DIAGNOSIS — S0990XA Unspecified injury of head, initial encounter: Secondary | ICD-10-CM | POA: Diagnosis present

## 2015-09-10 DIAGNOSIS — W19XXXA Unspecified fall, initial encounter: Secondary | ICD-10-CM

## 2015-09-10 NOTE — ED Notes (Signed)
[facility].  Pt reports that he was getting dressed, got tangled up with his clothes, and lost his balance and fell.  Pt hit head on the night stand, sustaining hematoma to left side of head.  Has had no loss of consciousness.  No other complaints.  Pt presents to ED A/O x 4, in no s/s apparent distress.

## 2015-09-10 NOTE — ED Notes (Signed)
Bed: WA09 Expected date:  Expected time:  Means of arrival:  Comments: EMS 89M fall SNF

## 2015-09-10 NOTE — ED Provider Notes (Signed)
CSN: [id number]     Arrival date & time 09/10/15  2120 History   First MD Initiated Contact with Patient 09/10/15 2137     Chief Complaint  Patient presents with  . Fall  . Head Injury     (Consider location/radiation/quality/duration/timing/severity/associated sxs/prior Treatment) Patient is a 69 y.o. male presenting with fall. The history is provided by the patient and the EMS personnel.  Fall This is a new problem. The current episode started 1 to 2 hours ago. The problem occurs constantly. The problem has not changed since onset.Associated symptoms include headaches. Pertinent negatives include no chest pain, no abdominal pain and no shortness of breath. Nothing aggravates the symptoms. Nothing relieves the symptoms. He has tried nothing for the symptoms.    Past Medical History  Diagnosis Date  . Colon polyp 2005    Tubular Adenoma  . Hypertension 1999  . Arthritis   . Hyperlipidemia 1999  . Epilepsy (HCC)   . Sleep apnea   . Chronic kidney disease   . Coronary artery disease, non-occlusive     Minimal nonobstructive CAD, nl LV systolic fxn by cath 12/11/12  . Goiter, nontoxic, multinodular     Thyroid US 11/2012  . Diabetes mellitus 2001    TYPE 2  . Neuromuscular disorder (HCC)   . Splinter 04/27/13    metal plinter removed rt pointer finger  . GERD (gastroesophageal reflux disease) 04/23/2014  . Diverticulosis   . Internal hemorrhoids   . Stroke (HCC) 06/2015  . Vision changes 06/2015    since CVA   Past Surgical History  Procedure Laterality Date  . Knee surgery  2008    Left  . Cholecystectomy    . Inguinal hernia repair  06/27/11    left  . Esophagogastroduodenoscopy  10/14/2012    normal   . Colonoscopy  12/28/2009  . Left heart catheterization with coronary angiogram N/A 12/11/2012    Procedure: LEFT HEART CATHETERIZATION WITH CORONARY ANGIOGRAM;  Surgeon: Tonny Bollman, MD;  Location: [facility] CATH LAB;  Service: Cardiovascular;  Laterality: N/A;   Family  History  Problem Relation Age of Onset  . Breast cancer Sister   . Colon cancer      unknown  . Diabetes Sister   . Heart disease Mother   . Alzheimer's disease Father    Social History  Substance Use Topics  . Smoking status: Former Smoker    Types: Cigars    Quit date: 04/14/1986  . Smokeless tobacco: Never Used     Comment: Quit cigars in the 1980s  . Alcohol Use: No    Review of Systems  Respiratory: Negative for shortness of breath.   Cardiovascular: Negative for chest pain.  Gastrointestinal: Negative for abdominal pain.  Neurological: Positive for headaches.  All other systems reviewed and are negative.     Allergies  Review of patient's allergies indicates no known allergies.  Home Medications   Prior to Admission medications   Medication Sig Start Date End Date Taking? Authorizing Provider  amLODipine (NORVASC) 2.5 MG tablet Take 2.5 mg by mouth daily.   Yes Historical Provider, MD  aspirin 325 MG tablet Take 1 tablet (325 mg total) by mouth daily. 06/22/15  Yes Catarina Hartshorn, MD  atorvastatin (LIPITOR) 40 MG tablet Take 40 mg by mouth daily.   Yes Historical Provider, MD  carvedilol (COREG) 6.25 MG tablet Take 3.125 mg by mouth 2 (two) times daily with a meal.    Yes Historical Provider, MD  escitalopram (LEXAPRO)  5 MG tablet Take 5 mg by mouth daily.   Yes Historical Provider, MD  glyBURIDE-metformin (GLUCOVANCE) 2.5-500 MG per tablet Take 1 tablet by mouth 2 (two) times daily with a meal.   Yes Historical Provider, MD  levETIRAcetam (KEPPRA) 500 MG tablet Take 500 mg by mouth 2 (two) times daily.     Yes Historical Provider, MD  lisinopril (PRINIVIL,ZESTRIL) 20 MG tablet Take 1 tablet (20 mg total) by mouth daily. 06/22/15  Yes Catarina Hartshorn, MD  pantoprazole (PROTONIX) 40 MG tablet Take 1 tablet (40 mg total) by mouth daily. 06/22/15  Yes Catarina Hartshorn, MD  phenytoin (DILANTIN) 100 MG ER capsule Take 3 capsules (300 mg total) by mouth daily. Start 06/23/2015 Patient taking  differently: Take 200 mg by mouth every morning. Take with 30 mg to equal 230 mg. 06/23/15  Yes Catarina Hartshorn, MD  phenytoin (DILANTIN) 30 MG ER capsule Take 30 mg by mouth every morning. Take with 200 mg to equal 230 mg.   Yes Historical Provider, MD  Polyethyl Glycol-Propyl Glycol (SYSTANE ULTRA OP) Place 1 drop into both eyes daily.   Yes Historical Provider, MD  potassium chloride SA (K-DUR,KLOR-CON) 20 MEQ tablet Take 20 mEq by mouth daily.    Yes Historical Provider, MD  risperiDONE (RISPERDAL) 1 MG tablet Take 1 mg by mouth at bedtime.   Yes Historical Provider, MD  tamsulosin (FLOMAX) 0.4 MG CAPS capsule Take 0.4 mg by mouth daily after supper.   Yes Historical Provider, MD   BP 162/76 mmHg  Pulse 76  Temp(Src) 98 F (36.7 C) (Oral)  Resp 20  SpO2 99% Physical Exam  Constitutional: He is oriented to person, place, and time. He appears well-developed and well-nourished. No distress.  HENT:  Head: Normocephalic. Head is with contusion (to left scalp).    Eyes: Conjunctivae are normal.  Neck: Neck supple. No tracheal deviation present.  Cardiovascular: Normal rate and regular rhythm.   Pulmonary/Chest: Effort normal. No respiratory distress.  Abdominal: Soft. He exhibits no distension. There is no tenderness.  Musculoskeletal:       Left hip: He exhibits tenderness (mild posterior).  Neurological: He is alert and oriented to person, place, and time.  Skin: Skin is warm and dry.  Psychiatric: He has a normal mood and affect.    ED Course  Procedures (including critical care time) Labs Review Labs Reviewed - No data to display  Imaging Review Dg Pelvis 1-2 Views  09/11/2015  CLINICAL DATA:  Status post fall on left side. Left hip lateral bruising and swelling. Initial encounter. EXAM: PELVIS - 1-2 VIEW COMPARISON:  CT of the abdomen and pelvis from 05/09/2011 FINDINGS: There is no evidence of fracture or dislocation. Both femoral heads are seated normally within their respective  acetabula. Mild degenerative change is noted at the lower lumbar spine. The sacroiliac joints are unremarkable in appearance. The visualized bowel gas pattern is grossly unremarkable in appearance. Scattered phleboliths are noted within the pelvis. IMPRESSION: No evidence of fracture or dislocation. Electronically Signed   By: Roanna Raider M.D.   On: 09/11/2015 00:00   Ct Head Wo Contrast  09/10/2015  CLINICAL DATA:  Fall, head injury left head hematoma, prior CVA EXAM: CT HEAD WITHOUT CONTRAST TECHNIQUE: Contiguous axial images were obtained from the base of the skull through the vertex without intravenous contrast. COMPARISON:  MRI brain dated 06/16/2015.  CT head dated CT 2016 FINDINGS: No evidence of parenchymal hemorrhage or extra-axial fluid collection. No mass lesion, mass effect,  or midline shift. Agenesis of the corpus callosum. Stable dorsal interhemispheric cyst to the left of midline. No CT evidence of acute infarction. Encephalomalacic changes related to prior right PCA distribution infarct. Subcortical white matter and periventricular small vessel ischemic changes. Global cortical and central atrophy.  Stable ventriculomegaly. The visualized paranasal sinuses are essentially clear. The mastoid air cells are unopacified. No evidence of calvarial fracture. IMPRESSION: No evidence of acute intracranial abnormality. Encephalomalacic changes related to prior right PCA distribution infarct. Additional stable chronic changes, as above. Electronically Signed   By: Charline BillsSriyesh  Krishnan M.D.   On: 09/10/2015 22:10   I have personally reviewed and evaluated these images and lab results as part of my medical decision-making.   EKG Interpretation None      MDM   Final diagnoses:  Fall from standing, initial encounter  Scalp contusion, initial encounter    69 year old male presents with fall from standing after tripping while getting dressed today. He struck his head on the ground sustaining a  bruise the left side of his head. He was also having some pain over his left buttock from where he landed. Retirement facility sent him here for assessment. No loss of consciousness, alert and oriented without any neurologic deficits on arrival. CT head without signs of intracranial hemorrhage or other traumatic injury. Pelvis without bony injury. Plan to follow up with PCP as needed and return precautions discussed for worsening or new concerning symptoms.   Lyndal Pulleyaniel Vicki Chaffin, MD 09/11/15 [phone]

## 2015-09-11 DIAGNOSIS — R259 Unspecified abnormal involuntary movements: Secondary | ICD-10-CM | POA: Diagnosis not present

## 2015-09-11 NOTE — Discharge Instructions (Signed)
Fall Prevention in the Home  Falls can cause injuries and can affect people from all age groups. There are many simple things that you can do to make your home safe and to help prevent falls. WHAT CAN I DO ON THE OUTSIDE OF MY HOME?  Regularly repair the edges of walkways and driveways and fix any cracks.  Remove high doorway thresholds.  Trim any shrubbery on the main path into your home.  Use bright outdoor lighting.  Clear walkways of debris and clutter, including tools and rocks.  Regularly check that handrails are securely fastened and in good repair. Both sides of any steps should have handrails.  Install guardrails along the edges of any raised decks or porches.  Have leaves, snow, and ice cleared regularly.  Use sand or salt on walkways during winter months.  In the garage, clean up any spills right away, including grease or oil spills. WHAT CAN I DO IN THE BATHROOM?  Use night lights.  Install grab bars by the toilet and in the tub and shower. Do not use towel bars as grab bars.  Use non-skid mats or decals on the floor of the tub or shower.  If you need to sit down while you are in the shower, use a plastic, non-slip stool..  Keep the floor dry. Immediately clean up any water that spills on the floor.  Remove soap buildup in the tub or shower on a regular basis.  Attach bath mats securely with double-sided non-slip rug tape.  Remove throw rugs and other tripping hazards from the floor. WHAT CAN I DO IN THE BEDROOM?  Use night lights.  Make sure that a bedside light is easy to reach.  Do not use oversized bedding that drapes onto the floor.  Have a firm chair that has side arms to use for getting dressed.  Remove throw rugs and other tripping hazards from the floor. WHAT CAN I DO IN THE KITCHEN?   Clean up any spills right away.  Avoid walking on wet floors.  Place frequently used items in easy-to-reach places.  If you need to reach for something  above you, use a sturdy step stool that has a grab bar.  Keep electrical cables out of the way.  Do not use floor polish or wax that makes floors slippery. If you have to use wax, make sure that it is non-skid floor wax.  Remove throw rugs and other tripping hazards from the floor. WHAT CAN I DO IN THE STAIRWAYS?  Do not leave any items on the stairs.  Make sure that there are handrails on both sides of the stairs. Fix handrails that are broken or loose. Make sure that handrails are as long as the stairways.  Check any carpeting to make sure that it is firmly attached to the stairs. Fix any carpet that is loose or worn.  Avoid having throw rugs at the top or bottom of stairways, or secure the rugs with carpet tape to prevent them from moving.  Make sure that you have a light switch at the top of the stairs and the bottom of the stairs. If you do not have them, have them installed. WHAT ARE SOME OTHER FALL PREVENTION TIPS?  Wear closed-toe shoes that fit well and support your feet. Wear shoes that have rubber soles or low heels.  When you use a stepladder, make sure that it is completely opened and that the sides are firmly locked. Have someone hold the ladder while you   are using it. Do not climb a closed stepladder.  Add color or contrast paint or tape to grab bars and handrails in your home. Place contrasting color strips on the first and last steps.  Use mobility aids as needed, such as canes, walkers, scooters, and crutches.  Turn on lights if it is dark. Replace any light bulbs that burn out.  Set up furniture so that there are clear paths. Keep the furniture in the same spot.  Fix any uneven floor surfaces.  Choose a carpet design that does not hide the edge of steps of a stairway.  Be aware of any and all pets.  Review your medicines with your healthcare provider. Some medicines can cause dizziness or changes in blood pressure, which increase your risk of falling. Talk  with your health care provider about other ways that you can decrease your risk of falls. This may include working with a physical therapist or trainer to improve your strength, balance, and endurance.   This information is not intended to replace advice given to you by your health care provider. Make sure you discuss any questions you have with your health care provider.   Document Released: 10/20/2002 Document Revised: 03/16/2015 Document Reviewed: 12/04/2014 Elsevier Interactive Patient Education 2016 [employer]  

## 2015-09-15 DIAGNOSIS — H53462 Homonymous bilateral field defects, left side: Secondary | ICD-10-CM | POA: Diagnosis not present

## 2015-09-17 DIAGNOSIS — Z6827 Body mass index (BMI) 27.0-27.9, adult: Secondary | ICD-10-CM | POA: Diagnosis not present

## 2015-09-17 DIAGNOSIS — I638 Other cerebral infarction: Secondary | ICD-10-CM | POA: Diagnosis not present

## 2015-09-17 DIAGNOSIS — E784 Other hyperlipidemia: Secondary | ICD-10-CM | POA: Diagnosis not present

## 2015-09-17 DIAGNOSIS — I10 Essential (primary) hypertension: Secondary | ICD-10-CM | POA: Diagnosis not present

## 2015-09-17 DIAGNOSIS — E119 Type 2 diabetes mellitus without complications: Secondary | ICD-10-CM | POA: Diagnosis not present

## 2015-09-17 DIAGNOSIS — R569 Unspecified convulsions: Secondary | ICD-10-CM | POA: Diagnosis not present

## 2015-09-24 DIAGNOSIS — Z9181 History of falling: Secondary | ICD-10-CM | POA: Diagnosis not present

## 2015-09-24 DIAGNOSIS — G40909 Epilepsy, unspecified, not intractable, without status epilepticus: Secondary | ICD-10-CM | POA: Diagnosis not present

## 2015-09-24 DIAGNOSIS — R2689 Other abnormalities of gait and mobility: Secondary | ICD-10-CM | POA: Diagnosis not present

## 2015-09-24 DIAGNOSIS — E119 Type 2 diabetes mellitus without complications: Secondary | ICD-10-CM | POA: Diagnosis not present

## 2015-09-24 DIAGNOSIS — I10 Essential (primary) hypertension: Secondary | ICD-10-CM | POA: Diagnosis not present

## 2015-09-28 DIAGNOSIS — E119 Type 2 diabetes mellitus without complications: Secondary | ICD-10-CM | POA: Diagnosis not present

## 2015-09-28 DIAGNOSIS — R2689 Other abnormalities of gait and mobility: Secondary | ICD-10-CM | POA: Diagnosis not present

## 2015-09-28 DIAGNOSIS — I10 Essential (primary) hypertension: Secondary | ICD-10-CM | POA: Diagnosis not present

## 2015-09-28 DIAGNOSIS — Z9181 History of falling: Secondary | ICD-10-CM | POA: Diagnosis not present

## 2015-09-28 DIAGNOSIS — G40909 Epilepsy, unspecified, not intractable, without status epilepticus: Secondary | ICD-10-CM | POA: Diagnosis not present

## 2015-10-01 DIAGNOSIS — G40909 Epilepsy, unspecified, not intractable, without status epilepticus: Secondary | ICD-10-CM | POA: Diagnosis not present

## 2015-10-01 DIAGNOSIS — Z9181 History of falling: Secondary | ICD-10-CM | POA: Diagnosis not present

## 2015-10-01 DIAGNOSIS — I10 Essential (primary) hypertension: Secondary | ICD-10-CM | POA: Diagnosis not present

## 2015-10-01 DIAGNOSIS — E119 Type 2 diabetes mellitus without complications: Secondary | ICD-10-CM | POA: Diagnosis not present

## 2015-10-01 DIAGNOSIS — R2689 Other abnormalities of gait and mobility: Secondary | ICD-10-CM | POA: Diagnosis not present

## 2015-10-01 DIAGNOSIS — Z23 Encounter for immunization: Secondary | ICD-10-CM | POA: Diagnosis not present

## 2015-10-05 DIAGNOSIS — R2689 Other abnormalities of gait and mobility: Secondary | ICD-10-CM | POA: Diagnosis not present

## 2015-10-05 DIAGNOSIS — G40909 Epilepsy, unspecified, not intractable, without status epilepticus: Secondary | ICD-10-CM | POA: Diagnosis not present

## 2015-10-05 DIAGNOSIS — I10 Essential (primary) hypertension: Secondary | ICD-10-CM | POA: Diagnosis not present

## 2015-10-05 DIAGNOSIS — Z9181 History of falling: Secondary | ICD-10-CM | POA: Diagnosis not present

## 2015-10-05 DIAGNOSIS — E119 Type 2 diabetes mellitus without complications: Secondary | ICD-10-CM | POA: Diagnosis not present

## 2015-10-08 DIAGNOSIS — I10 Essential (primary) hypertension: Secondary | ICD-10-CM | POA: Diagnosis not present

## 2015-10-08 DIAGNOSIS — E119 Type 2 diabetes mellitus without complications: Secondary | ICD-10-CM | POA: Diagnosis not present

## 2015-10-08 DIAGNOSIS — G40909 Epilepsy, unspecified, not intractable, without status epilepticus: Secondary | ICD-10-CM | POA: Diagnosis not present

## 2015-10-08 DIAGNOSIS — Z9181 History of falling: Secondary | ICD-10-CM | POA: Diagnosis not present

## 2015-10-08 DIAGNOSIS — R2689 Other abnormalities of gait and mobility: Secondary | ICD-10-CM | POA: Diagnosis not present

## 2015-10-11 DIAGNOSIS — Z111 Encounter for screening for respiratory tuberculosis: Secondary | ICD-10-CM | POA: Diagnosis not present

## 2015-10-12 DIAGNOSIS — R2689 Other abnormalities of gait and mobility: Secondary | ICD-10-CM | POA: Diagnosis not present

## 2015-10-12 DIAGNOSIS — Z9181 History of falling: Secondary | ICD-10-CM | POA: Diagnosis not present

## 2015-10-12 DIAGNOSIS — E119 Type 2 diabetes mellitus without complications: Secondary | ICD-10-CM | POA: Diagnosis not present

## 2015-10-12 DIAGNOSIS — I10 Essential (primary) hypertension: Secondary | ICD-10-CM | POA: Diagnosis not present

## 2015-10-12 DIAGNOSIS — G40909 Epilepsy, unspecified, not intractable, without status epilepticus: Secondary | ICD-10-CM | POA: Diagnosis not present

## 2015-10-25 DIAGNOSIS — R300 Dysuria: Secondary | ICD-10-CM | POA: Diagnosis not present

## 2015-12-20 DIAGNOSIS — I638 Other cerebral infarction: Secondary | ICD-10-CM | POA: Diagnosis not present

## 2015-12-20 DIAGNOSIS — I10 Essential (primary) hypertension: Secondary | ICD-10-CM | POA: Diagnosis not present

## 2015-12-20 DIAGNOSIS — E119 Type 2 diabetes mellitus without complications: Secondary | ICD-10-CM | POA: Diagnosis not present

## 2015-12-20 DIAGNOSIS — R569 Unspecified convulsions: Secondary | ICD-10-CM | POA: Diagnosis not present

## 2015-12-20 DIAGNOSIS — Z6827 Body mass index (BMI) 27.0-27.9, adult: Secondary | ICD-10-CM | POA: Diagnosis not present

## 2015-12-20 DIAGNOSIS — R197 Diarrhea, unspecified: Secondary | ICD-10-CM | POA: Diagnosis not present

## 2015-12-20 DIAGNOSIS — N3281 Overactive bladder: Secondary | ICD-10-CM | POA: Diagnosis not present

## 2015-12-21 DIAGNOSIS — I10 Essential (primary) hypertension: Secondary | ICD-10-CM | POA: Diagnosis not present

## 2015-12-21 DIAGNOSIS — D518 Other vitamin B12 deficiency anemias: Secondary | ICD-10-CM | POA: Diagnosis not present

## 2015-12-21 DIAGNOSIS — E559 Vitamin D deficiency, unspecified: Secondary | ICD-10-CM | POA: Diagnosis not present

## 2015-12-21 DIAGNOSIS — Z79899 Other long term (current) drug therapy: Secondary | ICD-10-CM | POA: Diagnosis not present

## 2016-01-09 DIAGNOSIS — M6281 Muscle weakness (generalized): Secondary | ICD-10-CM | POA: Diagnosis not present

## 2016-01-09 DIAGNOSIS — R2681 Unsteadiness on feet: Secondary | ICD-10-CM | POA: Diagnosis not present

## 2016-01-10 DIAGNOSIS — M6281 Muscle weakness (generalized): Secondary | ICD-10-CM | POA: Diagnosis not present

## 2016-01-10 DIAGNOSIS — R2681 Unsteadiness on feet: Secondary | ICD-10-CM | POA: Diagnosis not present

## 2016-01-13 DIAGNOSIS — M6281 Muscle weakness (generalized): Secondary | ICD-10-CM | POA: Diagnosis not present

## 2016-01-13 DIAGNOSIS — R2681 Unsteadiness on feet: Secondary | ICD-10-CM | POA: Diagnosis not present

## 2016-01-17 DIAGNOSIS — R2681 Unsteadiness on feet: Secondary | ICD-10-CM | POA: Diagnosis not present

## 2016-01-17 DIAGNOSIS — M6281 Muscle weakness (generalized): Secondary | ICD-10-CM | POA: Diagnosis not present

## 2016-01-18 DIAGNOSIS — Z79899 Other long term (current) drug therapy: Secondary | ICD-10-CM | POA: Diagnosis not present

## 2016-01-19 DIAGNOSIS — M79675 Pain in left toe(s): Secondary | ICD-10-CM | POA: Diagnosis not present

## 2016-01-19 DIAGNOSIS — B351 Tinea unguium: Secondary | ICD-10-CM | POA: Diagnosis not present

## 2016-01-19 DIAGNOSIS — M79674 Pain in right toe(s): Secondary | ICD-10-CM | POA: Diagnosis not present

## 2016-01-22 DIAGNOSIS — R2681 Unsteadiness on feet: Secondary | ICD-10-CM | POA: Diagnosis not present

## 2016-01-22 DIAGNOSIS — M6281 Muscle weakness (generalized): Secondary | ICD-10-CM | POA: Diagnosis not present

## 2016-01-24 DIAGNOSIS — R2681 Unsteadiness on feet: Secondary | ICD-10-CM | POA: Diagnosis not present

## 2016-01-24 DIAGNOSIS — M6281 Muscle weakness (generalized): Secondary | ICD-10-CM | POA: Diagnosis not present

## 2016-01-26 DIAGNOSIS — R2681 Unsteadiness on feet: Secondary | ICD-10-CM | POA: Diagnosis not present

## 2016-01-26 DIAGNOSIS — M6281 Muscle weakness (generalized): Secondary | ICD-10-CM | POA: Diagnosis not present

## 2016-01-28 DIAGNOSIS — Z79899 Other long term (current) drug therapy: Secondary | ICD-10-CM | POA: Diagnosis not present

## 2016-01-28 DIAGNOSIS — R159 Full incontinence of feces: Secondary | ICD-10-CM | POA: Diagnosis not present

## 2016-01-28 DIAGNOSIS — R2681 Unsteadiness on feet: Secondary | ICD-10-CM | POA: Diagnosis not present

## 2016-01-28 DIAGNOSIS — R569 Unspecified convulsions: Secondary | ICD-10-CM | POA: Diagnosis not present

## 2016-01-28 DIAGNOSIS — E119 Type 2 diabetes mellitus without complications: Secondary | ICD-10-CM | POA: Diagnosis not present

## 2016-01-28 DIAGNOSIS — Z6825 Body mass index (BMI) 25.0-25.9, adult: Secondary | ICD-10-CM | POA: Diagnosis not present

## 2016-01-28 DIAGNOSIS — I10 Essential (primary) hypertension: Secondary | ICD-10-CM | POA: Diagnosis not present

## 2016-01-28 DIAGNOSIS — R634 Abnormal weight loss: Secondary | ICD-10-CM | POA: Diagnosis not present

## 2016-01-28 DIAGNOSIS — R413 Other amnesia: Secondary | ICD-10-CM | POA: Diagnosis not present

## 2016-01-28 DIAGNOSIS — N3281 Overactive bladder: Secondary | ICD-10-CM | POA: Diagnosis not present

## 2016-01-28 DIAGNOSIS — M6281 Muscle weakness (generalized): Secondary | ICD-10-CM | POA: Diagnosis not present

## 2016-01-28 DIAGNOSIS — R946 Abnormal results of thyroid function studies: Secondary | ICD-10-CM | POA: Diagnosis not present

## 2016-01-30 ENCOUNTER — Encounter (HOSPITAL_COMMUNITY): Payer: Self-pay | Admitting: Emergency Medicine

## 2016-01-30 ENCOUNTER — Emergency Department (HOSPITAL_COMMUNITY)
Admission: EM | Admit: 2016-01-30 | Discharge: 2016-01-31 | Disposition: A | Discharge status: Home / Self Care | Payer: Medicare Other | Attending: Emergency Medicine | Admitting: Emergency Medicine

## 2016-01-30 DIAGNOSIS — K219 Gastro-esophageal reflux disease without esophagitis: Secondary | ICD-10-CM | POA: Diagnosis not present

## 2016-01-30 DIAGNOSIS — Z7982 Long term (current) use of aspirin: Secondary | ICD-10-CM | POA: Diagnosis not present

## 2016-01-30 DIAGNOSIS — Z8673 Personal history of transient ischemic attack (TIA), and cerebral infarction without residual deficits: Secondary | ICD-10-CM | POA: Insufficient documentation

## 2016-01-30 DIAGNOSIS — Z79899 Other long term (current) drug therapy: Secondary | ICD-10-CM | POA: Diagnosis not present

## 2016-01-30 DIAGNOSIS — Z87891 Personal history of nicotine dependence: Secondary | ICD-10-CM | POA: Insufficient documentation

## 2016-01-30 DIAGNOSIS — E1165 Type 2 diabetes mellitus with hyperglycemia: Secondary | ICD-10-CM | POA: Diagnosis not present

## 2016-01-30 DIAGNOSIS — M1990 Unspecified osteoarthritis, unspecified site: Secondary | ICD-10-CM | POA: Insufficient documentation

## 2016-01-30 DIAGNOSIS — Z7984 Long term (current) use of oral hypoglycemic drugs: Secondary | ICD-10-CM | POA: Insufficient documentation

## 2016-01-30 DIAGNOSIS — R2681 Unsteadiness on feet: Secondary | ICD-10-CM | POA: Diagnosis not present

## 2016-01-30 DIAGNOSIS — R739 Hyperglycemia, unspecified: Secondary | ICD-10-CM

## 2016-01-30 DIAGNOSIS — I2510 Atherosclerotic heart disease of native coronary artery without angina pectoris: Secondary | ICD-10-CM | POA: Diagnosis not present

## 2016-01-30 DIAGNOSIS — Z9889 Other specified postprocedural states: Secondary | ICD-10-CM | POA: Insufficient documentation

## 2016-01-30 DIAGNOSIS — Z86010 Personal history of colonic polyps: Secondary | ICD-10-CM | POA: Insufficient documentation

## 2016-01-30 DIAGNOSIS — I129 Hypertensive chronic kidney disease with stage 1 through stage 4 chronic kidney disease, or unspecified chronic kidney disease: Secondary | ICD-10-CM | POA: Diagnosis not present

## 2016-01-30 DIAGNOSIS — G40909 Epilepsy, unspecified, not intractable, without status epilepticus: Secondary | ICD-10-CM | POA: Diagnosis not present

## 2016-01-30 DIAGNOSIS — N189 Chronic kidney disease, unspecified: Secondary | ICD-10-CM | POA: Insufficient documentation

## 2016-01-30 DIAGNOSIS — M6281 Muscle weakness (generalized): Secondary | ICD-10-CM | POA: Diagnosis not present

## 2016-01-30 LAB — I-STAT VENOUS BLOOD GAS, ED
Acid-Base Excess: 4 mmol/L — ABNORMAL HIGH (ref 0.0–2.0)
Bicarbonate: 30.7 mEq/L — ABNORMAL HIGH (ref 20.0–24.0)
O2 Saturation: 55 %
TCO2: 32 mmol/L (ref 0–100)
pCO2, Ven: 51.2 mmHg — ABNORMAL HIGH (ref 45.0–50.0)
pH, Ven: 7.385 — ABNORMAL HIGH (ref 7.250–7.300)
pO2, Ven: 30 mmHg — ABNORMAL LOW (ref 31.0–45.0)

## 2016-01-30 LAB — URINALYSIS, ROUTINE W REFLEX MICROSCOPIC
BILIRUBIN URINE: NEGATIVE
Glucose, UA: >1000 mg/dL — AB
Hgb urine dipstick: NEGATIVE
Ketones, ur: 40 mg/dL — AB
LEUKOCYTES UA: NEGATIVE
NITRITE: NEGATIVE
PH: 6.5 (ref 5.0–8.0)
Protein, ur: NEGATIVE mg/dL
SPECIFIC GRAVITY, URINE: 1.043 — AB (ref 1.005–1.030)

## 2016-01-30 LAB — CBC
HEMATOCRIT: 37.6 % — AB (ref 39.0–52.0)
HEMOGLOBIN: 13.3 g/dL (ref 13.0–17.0)
MCH: 31.3 pg (ref 26.0–34.0)
MCHC: 35.4 g/dL (ref 30.0–36.0)
MCV: 88.5 fL (ref 78.0–100.0)
Platelets: 174 10*3/uL (ref 150–400)
RBC: 4.25 MIL/uL (ref 4.22–5.81)
RDW: 12.8 % (ref 11.5–15.5)
WBC: 9.2 10*3/uL (ref 4.0–10.5)

## 2016-01-30 LAB — CBG MONITORING, ED
GLUCOSE-CAPILLARY: 504 mg/dL — AB (ref 65–99)
GLUCOSE-CAPILLARY: 442 mg/dL — AB (ref 65–99)

## 2016-01-30 LAB — URINE MICROSCOPIC-ADD ON: RBC / HPF: NONE SEEN RBC/hpf (ref 0–5)

## 2016-01-30 LAB — BASIC METABOLIC PANEL
ANION GAP: 12 (ref 5–15)
BUN: 13 mg/dL (ref 6–20)
CALCIUM: 9.8 mg/dL (ref 8.9–10.3)
CO2: 30 mmol/L (ref 22–32)
Chloride: 94 mmol/L — ABNORMAL LOW (ref 101–111)
Creatinine, Ser: 1.07 mg/dL (ref 0.61–1.24)
GFR calc Af Amer: >60 mL/min (ref >60)
Glucose, Bld: 511 mg/dL — ABNORMAL HIGH (ref 65–99)
POTASSIUM: 4 mmol/L (ref 3.5–5.1)
Sodium: 136 mmol/L (ref 135–145)

## 2016-01-30 MED ORDER — SODIUM CHLORIDE 0.9 % IV BOLUS (SEPSIS)
1000.0000 mL | Freq: Once | INTRAVENOUS | Status: AC
  Administered 2016-01-30: 1000 mL via INTRAVENOUS

## 2016-01-30 MED ORDER — INSULIN ASPART 100 UNIT/ML ~~LOC~~ SOLN
6.0000 [IU] | Freq: Once | SUBCUTANEOUS | Status: AC
  Administered 2016-01-30: 6 [IU] via INTRAVENOUS
  Filled 2016-01-30: qty 1

## 2016-01-30 NOTE — ED Notes (Signed)
Pt c/o high blood sugar ongoing for a couple of days. Pt poor historian.

## 2016-01-31 DIAGNOSIS — R2681 Unsteadiness on feet: Secondary | ICD-10-CM | POA: Diagnosis not present

## 2016-01-31 DIAGNOSIS — M6281 Muscle weakness (generalized): Secondary | ICD-10-CM | POA: Diagnosis not present

## 2016-01-31 DIAGNOSIS — E1165 Type 2 diabetes mellitus with hyperglycemia: Secondary | ICD-10-CM | POA: Diagnosis not present

## 2016-01-31 LAB — CBG MONITORING, ED
Glucose-Capillary: 226 mg/dL — ABNORMAL HIGH (ref 65–99)
Glucose-Capillary: 300 mg/dL — ABNORMAL HIGH (ref 65–99)

## 2016-01-31 MED ORDER — SODIUM CHLORIDE 0.9 % IV BOLUS (SEPSIS)
500.0000 mL | Freq: Once | INTRAVENOUS | Status: AC
  Administered 2016-01-31: 500 mL via INTRAVENOUS

## 2016-01-31 MED ORDER — INSULIN ASPART 100 UNIT/ML ~~LOC~~ SOLN
2.0000 [IU] | Freq: Once | SUBCUTANEOUS | Status: AC
Start: 2016-01-31 — End: 2016-01-31
  Administered 2016-01-31: 2 [IU] via INTRAVENOUS
  Filled 2016-01-31: qty 1

## 2016-01-31 NOTE — ED Provider Notes (Signed)
CSN: [id number]     Arrival date & time 01/30/16  1828 History   First MD Initiated Contact with Patient 01/30/16 2016     Chief Complaint  Patient presents with  . Hyperglycemia     (Consider location/radiation/quality/duration/timing/severity/associated sxs/prior Treatment) HPI Patient presents to the emergency department with elevated blood sugar and it started.  An unknown time ago, but was elevated last week at his doctor's office.  Patient was just started back on metformin last night.  Patient is in a nursing facility due to strokes.  The patient states that nothing seems to make the condition better or worse.  He was having episodes of incontinence and confusion during these times when his blood sugar was elevated.  The patient denies chest pain, shortness of breath, headache,blurred vision, neck pain, fever, cough, weakness, numbness, dizziness, anorexia, edema, abdominal pain, nausea, vomiting, diarrhea, rash, back pain, dysuria, hematemesis, bloody stool, near syncope, or syncope. Past Medical History  Diagnosis Date  . Colon polyp 2005    Tubular Adenoma  . Hypertension 1999  . Arthritis   . Hyperlipidemia 1999  . Epilepsy (HCC)   . Sleep apnea   . Chronic kidney disease   . Coronary artery disease, non-occlusive     Minimal nonobstructive CAD, nl LV systolic fxn by cath 12/11/12  . Goiter, nontoxic, multinodular     Thyroid US 11/2012  . Diabetes mellitus 2001    TYPE 2  . Neuromuscular disorder (HCC)   . Splinter 04/27/13    metal plinter removed rt pointer finger  . GERD (gastroesophageal reflux disease) 04/23/2014  . Diverticulosis   . Internal hemorrhoids   . Stroke (HCC) 06/2015  . Vision changes 06/2015    since CVA   Past Surgical History  Procedure Laterality Date  . Knee surgery  2008    Left  . Cholecystectomy    . Inguinal hernia repair  06/27/11    left  . Esophagogastroduodenoscopy  10/14/2012    normal   . Colonoscopy  12/28/2009  . Left heart  catheterization with coronary angiogram N/A 12/11/2012    Procedure: LEFT HEART CATHETERIZATION WITH CORONARY ANGIOGRAM;  Surgeon: Tonny Bollman, MD;  Location: [facility] CATH LAB;  Service: Cardiovascular;  Laterality: N/A;   Family History  Problem Relation Age of Onset  . Breast cancer Sister   . Colon cancer      unknown  . Diabetes Sister   . Heart disease Mother   . Alzheimer's disease Father    Social History  Substance Use Topics  . Smoking status: Former Smoker    Types: Cigars    Quit date: 04/14/1986  . Smokeless tobacco: Never Used     Comment: Quit cigars in the 1980s  . Alcohol Use: No    Review of Systems All other systems negative except as documented in the HPI. All pertinent positives and negatives as reviewed in the HPI.   Allergies  Review of patient's allergies indicates no known allergies.  Home Medications   Prior to Admission medications   Medication Sig Start Date End Date Taking? Authorizing Provider  acetaminophen (TYLENOL) 500 MG tablet Take 500 mg by mouth every 6 (six) hours as needed for moderate pain or headache.   Yes Historical Provider, MD  alum & mag hydroxide-simeth (MINTOX) 200-200-20 MG/5ML suspension Take 30 mLs by mouth every 6 (six) hours as needed for indigestion or heartburn.   Yes Historical Provider, MD  amLODipine (NORVASC) 2.5 MG tablet Take 2.5 mg by mouth  daily.   Yes Historical Provider, MD  aspirin 325 MG tablet Take 1 tablet (325 mg total) by mouth daily. 06/22/15  Yes Catarina Hartshornavid Tat, MD  atorvastatin (LIPITOR) 40 MG tablet Take 40 mg by mouth daily at 6 PM.    Yes Historical Provider, MD  carvedilol (COREG) 3.125 MG tablet Take 3.125 mg by mouth 2 (two) times daily with a meal.   Yes Historical Provider, MD  escitalopram (LEXAPRO) 5 MG tablet Take 5 mg by mouth daily.   Yes Historical Provider, MD  guaiFENesin (MUCINEX) 600 MG 12 hr tablet Take 1,200 mg by mouth 2 (two) times daily as needed for cough.   Yes Historical Provider, MD   levETIRAcetam (KEPPRA) 500 MG tablet Take 500 mg by mouth 2 (two) times daily.     Yes Historical Provider, MD  lisinopril (PRINIVIL,ZESTRIL) 20 MG tablet Take 1 tablet (20 mg total) by mouth daily. 06/22/15  Yes Catarina Hartshornavid Tat, MD  loperamide (IMODIUM) 2 MG capsule Take 2 mg by mouth daily as needed for diarrhea or loose stools.   Yes Historical Provider, MD  magnesium hydroxide (MILK OF MAGNESIA) 400 MG/5ML suspension Take 30 mLs by mouth daily as needed for mild constipation.   Yes Historical Provider, MD  magnesium oxide (MAG-OX) 400 MG tablet Take 400 mg by mouth daily.   Yes Historical Provider, MD  metFORMIN (GLUCOPHAGE) 500 MG tablet Take 500 mg by mouth daily with breakfast.   Yes Historical Provider, MD  neomycin-bacitracin-polymyxin (NEOSPORIN) ointment Apply 1 application topically daily as needed for wound care. apply to eye   Yes Historical Provider, MD  pantoprazole (PROTONIX) 40 MG tablet Take 1 tablet (40 mg total) by mouth daily. 06/22/15  Yes Catarina Hartshornavid Tat, MD  phenytoin (DILANTIN) 100 MG ER capsule Take 3 capsules (300 mg total) by mouth daily. Start 06/23/2015 Patient taking differently: Take 200 mg by mouth every morning. Take with 30 mg to equal 230 mg. 06/23/15  Yes Catarina Hartshornavid Tat, MD  phenytoin (DILANTIN) 30 MG ER capsule Take 30 mg by mouth every morning. Take with 200 mg to equal 230 mg.   Yes Historical Provider, MD  potassium chloride SA (K-DUR,KLOR-CON) 20 MEQ tablet Take 20 mEq by mouth daily.    Yes Historical Provider, MD  risperiDONE (RISPERDAL) 1 MG tablet Take 1 mg by mouth at bedtime.   Yes Historical Provider, MD  tamsulosin (FLOMAX) 0.4 MG CAPS capsule Take 0.4 mg by mouth daily as needed (for urinary flow).    Yes Historical Provider, MD   BP 148/83 mmHg  Pulse 57  Temp(Src) 98 F (36.7 C) (Oral)  Resp 16  Ht 5\' 11"  (1.803 m)  Wt 77.65 kg  BMI 23.89 kg/m2  SpO2 97% Physical Exam  Constitutional: He appears well-developed and well-nourished. No distress.  HENT:  Head:  Normocephalic and atraumatic.  Mouth/Throat: Oropharynx is clear and moist.  Eyes: Pupils are equal, round, and reactive to light.  Neck: Normal range of motion. Neck supple.  Cardiovascular: Normal rate, regular rhythm and normal heart sounds.  Exam reveals no gallop and no friction rub.   No murmur heard. Pulmonary/Chest: Effort normal and breath sounds normal. No respiratory distress. He has no wheezes.  Abdominal: Soft. Bowel sounds are normal. He exhibits no distension. There is no tenderness.  Neurological: He is alert. He exhibits normal muscle tone. Coordination normal.  Skin: Skin is warm and dry. No rash noted. No erythema.  Psychiatric: He has a normal mood and affect. His behavior is  normal.  Nursing note and vitals reviewed.   ED Course  Procedures (including critical care time) Labs Review Labs Reviewed  BASIC METABOLIC PANEL - Abnormal; Notable for the following:    Chloride 94 (*)    Glucose, Bld 511 (*)    All other components within normal limits  CBC - Abnormal; Notable for the following:    HCT 37.6 (*)    All other components within normal limits  URINALYSIS, ROUTINE W REFLEX MICROSCOPIC (NOT AT [facility]) - Abnormal; Notable for the following:    Specific Gravity, Urine 1.043 (*)    Glucose, UA >1000 (*)    Ketones, ur 40 (*)    All other components within normal limits  URINE MICROSCOPIC-ADD ON - Abnormal; Notable for the following:    Squamous Epithelial / LPF 0-5 (*)    Bacteria, UA RARE (*)    All other components within normal limits  CBG MONITORING, ED - Abnormal; Notable for the following:    Glucose-Capillary 504 (*)    All other components within normal limits  I-STAT VENOUS BLOOD GAS, ED - Abnormal; Notable for the following:    pH, Ven 7.385 (*)    pCO2, Ven 51.2 (*)    pO2, Ven 30.0 (*)    Bicarbonate 30.7 (*)    Acid-Base Excess 4.0 (*)    All other components within normal limits  CBG MONITORING, ED - Abnormal; Notable for the following:     Glucose-Capillary 442 (*)    All other components within normal limits  CBG MONITORING, ED - Abnormal; Notable for the following:    Glucose-Capillary 300 (*)    All other components within normal limits    Imaging Review No results found. I have personally reviewed and evaluated these images and lab results as part of my medical decision-making.    Patient is not showing any signs of DKA at this time.  The patient will need to be closely followed by his primary care doctor.  He is given IV fluids and insulin here in the emergency department.  I had a lengthy discussion with family about why we would like to be able to send him back to the facility tonight due to the fact that he is not in DKA and does not have any signs of infectious process at this time.  They agreed with this plan.  They were concerned with the length of stay and I advised them that this is done because we are trying to slowly reduce his blood sugar rather than dropping it quickly.  The understand the plan and all questions were answered  Charlestine Night, PA-C 01/31/16 0136  Charlestine Night, PA-C 01/31/16 1610  Linwood Dibbles, MD 01/31/16 [phone]

## 2016-01-31 NOTE — Discharge Instructions (Signed)
Return here as needed.  Follow-up with his primary care doctor, soon as possible

## 2016-02-02 DIAGNOSIS — R2681 Unsteadiness on feet: Secondary | ICD-10-CM | POA: Diagnosis not present

## 2016-02-02 DIAGNOSIS — M6281 Muscle weakness (generalized): Secondary | ICD-10-CM | POA: Diagnosis not present

## 2016-02-08 DIAGNOSIS — M6281 Muscle weakness (generalized): Secondary | ICD-10-CM | POA: Diagnosis not present

## 2016-02-08 DIAGNOSIS — R2681 Unsteadiness on feet: Secondary | ICD-10-CM | POA: Diagnosis not present

## 2016-02-10 DIAGNOSIS — R2681 Unsteadiness on feet: Secondary | ICD-10-CM | POA: Diagnosis not present

## 2016-02-10 DIAGNOSIS — M6281 Muscle weakness (generalized): Secondary | ICD-10-CM | POA: Diagnosis not present

## 2016-02-12 DIAGNOSIS — R2681 Unsteadiness on feet: Secondary | ICD-10-CM | POA: Diagnosis not present

## 2016-02-12 DIAGNOSIS — M6281 Muscle weakness (generalized): Secondary | ICD-10-CM | POA: Diagnosis not present

## 2016-02-14 DIAGNOSIS — R2681 Unsteadiness on feet: Secondary | ICD-10-CM | POA: Diagnosis not present

## 2016-02-14 DIAGNOSIS — M6281 Muscle weakness (generalized): Secondary | ICD-10-CM | POA: Diagnosis not present

## 2016-02-16 ENCOUNTER — Encounter: Payer: Self-pay | Admitting: Diagnostic Neuroimaging

## 2016-02-16 ENCOUNTER — Ambulatory Visit (INDEPENDENT_AMBULATORY_CARE_PROVIDER_SITE_OTHER): Payer: Medicare Other | Admitting: Diagnostic Neuroimaging

## 2016-02-16 VITALS — BP 108/63 | HR 69 | Ht 70.0 in | Wt 181.0 lb

## 2016-02-16 DIAGNOSIS — I63433 Cerebral infarction due to embolism of bilateral posterior cerebral arteries: Secondary | ICD-10-CM | POA: Diagnosis not present

## 2016-02-16 DIAGNOSIS — G40909 Epilepsy, unspecified, not intractable, without status epilepticus: Secondary | ICD-10-CM | POA: Diagnosis not present

## 2016-02-16 DIAGNOSIS — R2681 Unsteadiness on feet: Secondary | ICD-10-CM | POA: Diagnosis not present

## 2016-02-16 DIAGNOSIS — M6281 Muscle weakness (generalized): Secondary | ICD-10-CM | POA: Diagnosis not present

## 2016-02-16 NOTE — Progress Notes (Signed)
[facility]  PATIENT: Arthur Berry DOB: [birth year]  REFERRING CLINICIAN:  HISTORY FROM: patient and brother  REASON FOR VISIT: follow up    HISTORICAL  CHIEF COMPLAINT:  Chief Complaint  Patient presents with  . Cerebrovascular Accident    rm 7, lives at [facility], brotherLorella Nimrod  . Follow-up    6 month    HISTORY OF PRESENT ILLNESS:   UPDATE 02/16/16: Since last visit, no seizures. Now living in [facility] assisted living. Overall stable neurologically. However, had some issues with sugar levels and needed to have ER visit recently. Now sugars are improved.   UPDATE 08/17/15: Since last visit, patient had was admitted for syncope and stroke in Aug 2016. Records reviewed and appreciate stroke team and medical hospitalists for their assistance. Also had toxic dilantin level and now dilantin dosing adjusted. Now living at [facility]. No seizures. Overall doing well now. In good spirits.   UPDATE 07/31/12: Retired in May 2013. June 2013, had episode at home of fall, trapped in recliner x 2 days, and subsequent rhabdo and dehydration. was at [facility] x 4 weeks, now back home.  Over past 2-3 months, progressive gait decline, urinary incontinence, and cognitive decline. Not using CPAP machine x 3 months (out of filters). Has had low dilantin levels, although he says he was taking meds. Living alone. Continues to drive, inspite of high epworth scores and recommendation not to drive per Dr. Vickey Huger due to hypersomnia.   UPDATE 02/06/12: Since last visit, co-workers have noticed patient "falling asleep" on the job. No one available to talk to. Unclear if pts eyes open or closed during spells. Pt doesn't feel like he falls asleep. Has insomnia durign weekdays, but not during weekends. More stress related to job.  UPDATE 08/25/11: Doing well; no seizures.  Back to driving, but careful when he is tired (will ask others to drive).  Tolerating LEV and  DPH.    PRIOR HPI:  70 year old left-handed male with history of seizures since the age of 70 years old, presenting for intermittent episodes of zoning out, difficulty with concentration and memory problems for the past 2-3 months.  Patient is accompanied by his sister today. Patient had a normal birth and development.  He was diagnosed with seizures at the age of 70 years old. He was started on chlordiazepoxide and phenytoin at age of 81 or 70 years old. He did have some difficulty with school having to repeat the fifth, sixth, seventh and eighth grades.  He ultimately graduated with a GED. Patient's typical seizures in the past involved distortion of sound were things felt like they were "miles away". He reports generalized convulsions where he was able to hear people around him but unable to speak. He denies tongue biting or incontinence. His last reported seizure was in 1987. Over the past 2-3 months he has been having episodes of "zoning out" at work. It had been happening 1- 2 times per week. He has no warning for these events.  He is unaware of these events. Reportedly coworkers called his name during these events he did not respond. On September 16 he was in a meeting and a coworker called his name, walked over and tapped his shoulder, he still did not respond for approximately 30 seconds. The patient's sister is asking if there are newer seizure medications which may have less side effects than chlordiazepoxide and phenytoin.  There has been some gradual difficulty with concentration, thinking and memory over the  past several years.   REVIEW OF SYSTEMS: Full 14 system review of systems performed and notable only for double vision, diarrhea.  ALLERGIES: No Known Allergies  HOME MEDICATIONS: Outpatient Prescriptions Prior to Visit  Medication Sig Dispense Refill  . acetaminophen (TYLENOL) 500 MG tablet Take 500 mg by mouth every 6 (six) hours as needed for moderate pain or headache.    Marland Kitchen alum &  mag hydroxide-simeth (MINTOX) 200-200-20 MG/5ML suspension Take 30 mLs by mouth every 6 (six) hours as needed for indigestion or heartburn.    Marland Kitchen amLODipine (NORVASC) 2.5 MG tablet Take 2.5 mg by mouth daily.    Marland Kitchen aspirin 325 MG tablet Take 1 tablet (325 mg total) by mouth daily. 30 tablet 0  . atorvastatin (LIPITOR) 40 MG tablet Take 40 mg by mouth daily at 6 PM.     . carvedilol (COREG) 3.125 MG tablet Take 3.125 mg by mouth 2 (two) times daily with a meal.    . escitalopram (LEXAPRO) 5 MG tablet Take 5 mg by mouth daily.    Marland Kitchen guaiFENesin (MUCINEX) 600 MG 12 hr tablet Take 1,200 mg by mouth 2 (two) times daily as needed for cough.    . levETIRAcetam (KEPPRA) 500 MG tablet Take 500 mg by mouth 2 (two) times daily.      Marland Kitchen lisinopril (PRINIVIL,ZESTRIL) 20 MG tablet Take 1 tablet (20 mg total) by mouth daily. 30 tablet 0  . loperamide (IMODIUM) 2 MG capsule Take 2 mg by mouth daily as needed for diarrhea or loose stools.    . magnesium hydroxide (MILK OF MAGNESIA) 400 MG/5ML suspension Take 30 mLs by mouth daily as needed for mild constipation.    . magnesium oxide (MAG-OX) 400 MG tablet Take 400 mg by mouth daily.    . metFORMIN (GLUCOPHAGE) 500 MG tablet Take 500 mg by mouth daily with breakfast.    . neomycin-bacitracin-polymyxin (NEOSPORIN) ointment Apply 1 application topically daily as needed for wound care. apply to eye    . pantoprazole (PROTONIX) 40 MG tablet Take 1 tablet (40 mg total) by mouth daily. 60 tablet 0  . phenytoin (DILANTIN) 100 MG ER capsule Take 3 capsules (300 mg total) by mouth daily. Start 06/23/2015 (Patient taking differently: Take 200 mg by mouth every morning. Take with 30 mg to equal 230 mg.) 30 capsule 0  . phenytoin (DILANTIN) 30 MG ER capsule Take 30 mg by mouth every morning. Take with 200 mg to equal 230 mg.    . potassium chloride SA (K-DUR,KLOR-CON) 20 MEQ tablet Take 20 mEq by mouth daily.     . risperiDONE (RISPERDAL) 1 MG tablet Take 1 mg by mouth at bedtime.     . tamsulosin (FLOMAX) 0.4 MG CAPS capsule Take 0.4 mg by mouth daily as needed (for urinary flow).      No facility-administered medications prior to visit.    PAST MEDICAL HISTORY: Past Medical History  Diagnosis Date  . Colon polyp 2005    Tubular Adenoma  . Hypertension 1999  . Arthritis   . Hyperlipidemia 1999  . Epilepsy (HCC)   . Sleep apnea   . Chronic kidney disease   . Coronary artery disease, non-occlusive     Minimal nonobstructive CAD, nl LV systolic fxn by cath 12/11/12  . Goiter, nontoxic, multinodular     Thyroid US 11/2012  . Diabetes mellitus 2001    TYPE 2  . Neuromuscular disorder (HCC)   . Splinter 04/27/13    metal plinter removed rt  pointer finger  . GERD (gastroesophageal reflux disease) 04/23/2014  . Diverticulosis   . Internal hemorrhoids   . Stroke (HCC) 06/2015  . Vision changes 06/2015    since CVA    PAST SURGICAL HISTORY: Past Surgical History  Procedure Laterality Date  . Knee surgery  2008    Left  . Cholecystectomy    . Inguinal hernia repair  06/27/11    left  . Esophagogastroduodenoscopy  10/14/2012    normal   . Colonoscopy  12/28/2009  . Left heart catheterization with coronary angiogram N/A 12/11/2012    Procedure: LEFT HEART CATHETERIZATION WITH CORONARY ANGIOGRAM;  Surgeon: Tonny Bollman, MD;  Location: [facility] CATH LAB;  Service: Cardiovascular;  Laterality: N/A;    FAMILY HISTORY: Family History  Problem Relation Age of Onset  . Breast cancer Sister   . Colon cancer      unknown  . Diabetes Sister   . Heart disease Mother   . Alzheimer's disease Father     SOCIAL HISTORY:  Social History   Social History  . Marital Status: Divorced    Spouse Name: N/A  . Number of Children: 0  . Years of Education: N/A   Occupational History  . Entrance assistant [employer]   Social History Main Topics  . Smoking status: Former Smoker    Types: Cigars    Quit date: 04/14/1986  . Smokeless tobacco: Never Used     Comment: Quit  cigars in the 1980s  . Alcohol Use: No  . Drug Use: No  . Sexual Activity: No   Other Topics Concern  . Not on file   Social History Narrative     PHYSICAL EXAM  GENERAL EXAM/CONSTITUTIONAL: Vitals:  Filed Vitals:   02/16/16 1040  BP: 108/63  Pulse: 69  Height:  (1.778 m)  Weight: 181 lb (82.101 kg)   Body mass index is 25.97 kg/(m^2). No exam data present  Patient is in no distress; well developed, nourished and groomed; neck is supple  CARDIOVASCULAR:  Examination of carotid arteries is normal; no carotid bruits  Regular rate and rhythm, no murmurs  Examination of peripheral vascular system by observation and palpation is normal  EYES:  Ophthalmoscopic exam of optic discs and posterior segments is normal; no papilledema or hemorrhages  MUSCULOSKELETAL:  Gait, strength, tone, movements noted in Neurologic exam below  NEUROLOGIC: MENTAL STATUS:  No flowsheet data found.  awake, alert, oriented to person, place and time  recent and remote memory intact  normal attention and concentration  language fluent, comprehension intact, naming intact,   fund of knowledge appropriate  CRANIAL NERVE:   2nd - no papilledema on fundoscopic exam  2nd, 3rd, 4th, 6th - pupils equal and reactive to light, visual fields --> LEFT HOMONYMOUS HEMIANOPSIA, extraocular muscles intact, no nystagmus  5th - facial sensation symmetric  7th - facial strength symmetric  8th - hearing intact  9th - palate elevates symmetrically, uvula midline  11th - shoulder shrug symmetric  12th - tongue protrusion midline  MOTOR:   normal bulk and tone, full strength in the BUE, BLE; SLIGHTLY SLOWER ON LEFT SIDE  SENSORY:   normal and symmetric to light touch, temperature, vibration  COORDINATION:   finger-nose-finger, fine finger movements normal  REFLEXES:   deep tendon reflexes TRACE and symmetric  GAIT/STATION:   narrow based gait; USES A  WALKER    DIAGNOSTIC DATA (LABS, IMAGING, TESTING) - I reviewed patient records, labs, notes, testing and imaging myself where  available.  Lab Results  Component Value Date   WBC 9.2 01/30/2016   HGB 13.3 01/30/2016   HCT 37.6* 01/30/2016   MCV 88.5 01/30/2016   PLT 174 01/30/2016      Component Value Date/Time   NA 136 01/30/2016 1845   K 4.0 01/30/2016 1845   CL 94* 01/30/2016 1845   CO2 30 01/30/2016 1845   GLUCOSE 511* 01/30/2016 1845   BUN 13 01/30/2016 1845   CREATININE 1.07 01/30/2016 1845   CALCIUM 9.8 01/30/2016 1845   PROT 6.9 06/16/2015 0212   ALBUMIN 3.6 06/18/2015 1124   AST 27 06/16/2015 0212   ALT 17 06/16/2015 0212   ALKPHOS 125 06/16/2015 0212   BILITOT 0.7 06/16/2015 0212   GFRNONAA >60 01/30/2016 1845   GFRAA >60 01/30/2016 1845   Lab Results  Component Value Date   CHOL 101 06/16/2015   HDL 33* 06/16/2015   LDLCALC 51 06/16/2015   TRIG 85 06/16/2015   CHOLHDL 3.1 06/16/2015   Lab Results  Component Value Date   HGBA1C 7.0* 06/16/2015   Lab Results  Component Value Date   VITAMINB12 487 06/20/2015   Lab Results  Component Value Date   TSH 0.800 12/12/2012   06/16/15 MRI brain [I reviewed images myself and agree with interpretation. -VRP]  1. Limited study with only diffusion-weighted sequences performed. 2. Acute ischemic right PCA territory infarct without significant mass effect. 3. Additional small 6 mm acute ischemic infarct within the left occipital lobe. 4. 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 5. Small right frontal scalp contusion.  06/17/15 CTA head/neck [I reviewed images myself and agree with interpretation. -VRP]  1. Moderate proximal right PCA stenosis corresponds with the right occipital lobe infarct. 2. No significant change in the size the right occipital lobe infarct or hemorrhage. 3. Remote encephalomalacia of the anterior left frontal lobe. 4. Midline  cyst on the left with mass effect as described. 5. Mild diffuse small vessel disease evident on the MRA. 6. Multilevel degenerative changes throughout the cervical spine are most pronounced at C3-4 and C5-6. 7. Atherosclerotic changes within the carotid bifurcations bilaterally without significant stenosis.  06/17/15 TTE - Left ventricle: The cavity size was normal. There was moderate concentric hypertrophy. Systolic function was normal. Theestimated ejection fraction was in the range of 60% to 65%. Wall motion was normal; there were no regional wall motionabnormalities. Doppler parameters are consistent with abnormalleft ventricular relaxation (grade 1 diastolic dysfunction).  06/24/15 cardiac monitor - On event monitor no PAF, some other fast HR but nothing to cause stroke.   09/10/15 CT head [I reviewed images myself and agree with interpretation. -VRP]  - No evidence of acute intracranial abnormality. - Encephalomalacic changes related to prior right PCA distribution infarct. - Additional stable chronic changes, as above.    ASSESSMENT AND PLAN  70 y.o. year old male here with stroke (right > left PCA), possibly due to intracranial atherosclerosis vs cardio-embolic. Also with seizure disorder (on LEV and DPH). Overall stable.    Dx:  Cerebrovascular accident (CVA) due to bilateral embolism of posterior cerebral arteries  Seizure disorder (HCC)    PLAN: - secondary stroke prevention (aspirin 325mg  daily, statin, BP control, diabetes control) - continue (levetiracetam 500mg  BID + dilantin 260mg  daily) for seizure prevention  Return in about 6 months (around 08/17/2016).    Suanne MarkerVIKRAM R. Piya Mesch, MD 02/16/2016, 10:59 AM Certified in Neurology, Neurophysiology and Neuroimaging  [facility] [address],  [place], [place] [address] [phone]

## 2016-02-16 NOTE — Patient Instructions (Signed)
-  continue current medications

## 2016-02-18 DIAGNOSIS — M6281 Muscle weakness (generalized): Secondary | ICD-10-CM | POA: Diagnosis not present

## 2016-02-18 DIAGNOSIS — R2681 Unsteadiness on feet: Secondary | ICD-10-CM | POA: Diagnosis not present

## 2016-02-20 DIAGNOSIS — R2681 Unsteadiness on feet: Secondary | ICD-10-CM | POA: Diagnosis not present

## 2016-02-20 DIAGNOSIS — M6281 Muscle weakness (generalized): Secondary | ICD-10-CM | POA: Diagnosis not present

## 2016-02-21 DIAGNOSIS — R2681 Unsteadiness on feet: Secondary | ICD-10-CM | POA: Diagnosis not present

## 2016-02-21 DIAGNOSIS — M6281 Muscle weakness (generalized): Secondary | ICD-10-CM | POA: Diagnosis not present

## 2016-02-23 DIAGNOSIS — R2681 Unsteadiness on feet: Secondary | ICD-10-CM | POA: Diagnosis not present

## 2016-02-23 DIAGNOSIS — M6281 Muscle weakness (generalized): Secondary | ICD-10-CM | POA: Diagnosis not present

## 2016-02-29 DIAGNOSIS — R2681 Unsteadiness on feet: Secondary | ICD-10-CM | POA: Diagnosis not present

## 2016-02-29 DIAGNOSIS — M6281 Muscle weakness (generalized): Secondary | ICD-10-CM | POA: Diagnosis not present

## 2016-03-01 DIAGNOSIS — R2681 Unsteadiness on feet: Secondary | ICD-10-CM | POA: Diagnosis not present

## 2016-03-01 DIAGNOSIS — M6281 Muscle weakness (generalized): Secondary | ICD-10-CM | POA: Diagnosis not present

## 2016-03-03 DIAGNOSIS — R2681 Unsteadiness on feet: Secondary | ICD-10-CM | POA: Diagnosis not present

## 2016-03-03 DIAGNOSIS — M6281 Muscle weakness (generalized): Secondary | ICD-10-CM | POA: Diagnosis not present

## 2016-03-06 DIAGNOSIS — M6281 Muscle weakness (generalized): Secondary | ICD-10-CM | POA: Diagnosis not present

## 2016-03-06 DIAGNOSIS — R2681 Unsteadiness on feet: Secondary | ICD-10-CM | POA: Diagnosis not present

## 2016-03-08 DIAGNOSIS — M6281 Muscle weakness (generalized): Secondary | ICD-10-CM | POA: Diagnosis not present

## 2016-03-08 DIAGNOSIS — R2681 Unsteadiness on feet: Secondary | ICD-10-CM | POA: Diagnosis not present

## 2016-03-10 DIAGNOSIS — M6281 Muscle weakness (generalized): Secondary | ICD-10-CM | POA: Diagnosis not present

## 2016-03-10 DIAGNOSIS — R2681 Unsteadiness on feet: Secondary | ICD-10-CM | POA: Diagnosis not present

## 2016-04-07 DIAGNOSIS — E784 Other hyperlipidemia: Secondary | ICD-10-CM | POA: Diagnosis not present

## 2016-04-07 DIAGNOSIS — E119 Type 2 diabetes mellitus without complications: Secondary | ICD-10-CM | POA: Diagnosis not present

## 2016-04-07 DIAGNOSIS — R569 Unspecified convulsions: Secondary | ICD-10-CM | POA: Diagnosis not present

## 2016-04-07 DIAGNOSIS — R946 Abnormal results of thyroid function studies: Secondary | ICD-10-CM | POA: Diagnosis not present

## 2016-04-07 DIAGNOSIS — Z125 Encounter for screening for malignant neoplasm of prostate: Secondary | ICD-10-CM | POA: Diagnosis not present

## 2016-04-07 DIAGNOSIS — I10 Essential (primary) hypertension: Secondary | ICD-10-CM | POA: Diagnosis not present

## 2016-04-07 DIAGNOSIS — E559 Vitamin D deficiency, unspecified: Secondary | ICD-10-CM | POA: Diagnosis not present

## 2016-04-17 DIAGNOSIS — I10 Essential (primary) hypertension: Secondary | ICD-10-CM | POA: Diagnosis not present

## 2016-04-17 DIAGNOSIS — E119 Type 2 diabetes mellitus without complications: Secondary | ICD-10-CM | POA: Diagnosis not present

## 2016-04-17 DIAGNOSIS — I638 Other cerebral infarction: Secondary | ICD-10-CM | POA: Diagnosis not present

## 2016-04-17 DIAGNOSIS — R159 Full incontinence of feces: Secondary | ICD-10-CM | POA: Diagnosis not present

## 2016-04-17 DIAGNOSIS — Z6827 Body mass index (BMI) 27.0-27.9, adult: Secondary | ICD-10-CM | POA: Diagnosis not present

## 2016-04-17 DIAGNOSIS — Z1389 Encounter for screening for other disorder: Secondary | ICD-10-CM | POA: Diagnosis not present

## 2016-04-17 DIAGNOSIS — E559 Vitamin D deficiency, unspecified: Secondary | ICD-10-CM | POA: Diagnosis not present

## 2016-04-17 DIAGNOSIS — Z0000 Encounter for general adult medical examination without abnormal findings: Secondary | ICD-10-CM | POA: Diagnosis not present

## 2016-04-17 DIAGNOSIS — F329 Major depressive disorder, single episode, unspecified: Secondary | ICD-10-CM | POA: Diagnosis not present

## 2016-04-17 DIAGNOSIS — E784 Other hyperlipidemia: Secondary | ICD-10-CM | POA: Diagnosis not present

## 2016-04-17 DIAGNOSIS — R569 Unspecified convulsions: Secondary | ICD-10-CM | POA: Diagnosis not present

## 2016-05-02 DIAGNOSIS — M79674 Pain in right toe(s): Secondary | ICD-10-CM | POA: Diagnosis not present

## 2016-05-02 DIAGNOSIS — M79675 Pain in left toe(s): Secondary | ICD-10-CM | POA: Diagnosis not present

## 2016-05-02 DIAGNOSIS — B351 Tinea unguium: Secondary | ICD-10-CM | POA: Diagnosis not present

## 2016-05-08 DIAGNOSIS — Z79899 Other long term (current) drug therapy: Secondary | ICD-10-CM | POA: Diagnosis not present

## 2016-05-10 ENCOUNTER — Ambulatory Visit: Payer: Medicare Other | Admitting: Podiatry

## 2016-08-01 DIAGNOSIS — N390 Urinary tract infection, site not specified: Secondary | ICD-10-CM | POA: Diagnosis not present

## 2016-08-03 DIAGNOSIS — R569 Unspecified convulsions: Secondary | ICD-10-CM | POA: Diagnosis not present

## 2016-08-03 DIAGNOSIS — I10 Essential (primary) hypertension: Secondary | ICD-10-CM | POA: Diagnosis not present

## 2016-08-03 DIAGNOSIS — Z23 Encounter for immunization: Secondary | ICD-10-CM | POA: Diagnosis not present

## 2016-08-03 DIAGNOSIS — E119 Type 2 diabetes mellitus without complications: Secondary | ICD-10-CM | POA: Diagnosis not present

## 2016-08-03 DIAGNOSIS — I638 Other cerebral infarction: Secondary | ICD-10-CM | POA: Diagnosis not present

## 2016-08-03 DIAGNOSIS — Z6827 Body mass index (BMI) 27.0-27.9, adult: Secondary | ICD-10-CM | POA: Diagnosis not present

## 2016-08-03 DIAGNOSIS — R972 Elevated prostate specific antigen [PSA]: Secondary | ICD-10-CM | POA: Diagnosis not present

## 2016-08-03 DIAGNOSIS — E784 Other hyperlipidemia: Secondary | ICD-10-CM | POA: Diagnosis not present

## 2016-08-03 DIAGNOSIS — E559 Vitamin D deficiency, unspecified: Secondary | ICD-10-CM | POA: Diagnosis not present

## 2016-08-04 DIAGNOSIS — R319 Hematuria, unspecified: Secondary | ICD-10-CM | POA: Diagnosis not present

## 2016-08-04 DIAGNOSIS — Z79899 Other long term (current) drug therapy: Secondary | ICD-10-CM | POA: Diagnosis not present

## 2016-08-14 ENCOUNTER — Ambulatory Visit (INDEPENDENT_AMBULATORY_CARE_PROVIDER_SITE_OTHER): Payer: Medicare Other | Admitting: Diagnostic Neuroimaging

## 2016-08-14 ENCOUNTER — Encounter: Payer: Self-pay | Admitting: Diagnostic Neuroimaging

## 2016-08-14 VITALS — BP 123/71 | HR 63 | Wt 186.8 lb

## 2016-08-14 DIAGNOSIS — I63433 Cerebral infarction due to embolism of bilateral posterior cerebral arteries: Secondary | ICD-10-CM

## 2016-08-14 DIAGNOSIS — G40909 Epilepsy, unspecified, not intractable, without status epilepticus: Secondary | ICD-10-CM | POA: Diagnosis not present

## 2016-08-14 NOTE — Patient Instructions (Signed)
-   secondary stroke prevention (aspirin 325mg  daily, statin, BP control, diabetes control)  - continue (levetiracetam 500mg  twice a day + dilantin 200mg  daily) for seizure prevention

## 2016-08-14 NOTE — Progress Notes (Signed)
[facility]  PATIENT: Arthur Berry DOB: 01/25/1946  REFERRING CLINICIAN:  HISTORY FROM: patient (accompanied by driver) REASON FOR VISIT: follow up    HISTORICAL  CHIEF COMPLAINT:  Chief Complaint  Patient presents with  . Cerebrovascular Accident    rm 7, lives at [facility], driver- [place]  . Follow-up    6 month    HISTORY OF PRESENT ILLNESS:   UPDATE 08/14/16: Since last visit, doing about the same. No seizures. No new stroke symptoms. Tolerating meds.   UPDATE 02/16/16: Since last visit, no seizures. Now living in [facility] assisted living. Overall stable neurologically. However, had some issues with sugar levels and needed to have ER visit recently. Now sugars are improved.   UPDATE 08/17/15: Since last visit, patient had was admitted for syncope and stroke in Aug 2016. Records reviewed and appreciate stroke team and medical hospitalists for their assistance. Also had toxic dilantin level and now dilantin dosing adjusted. Now living at [facility]. No seizures. Overall doing well now. In good spirits.   UPDATE 07/31/12: Retired in May 2013. June 2013, had episode at home of fall, trapped in recliner x 2 days, and subsequent rhabdo and dehydration. was at [facility] x 4 weeks, now back home.  Over past 2-3 months, progressive gait decline, urinary incontinence, and cognitive decline. Not using CPAP machine x 3 months (out of filters). Has had low dilantin levels, although he says he was taking meds. Living alone. Continues to drive, inspite of high epworth scores and recommendation not to drive per Dr. Vickey Huger due to hypersomnia.   UPDATE 02/06/12: Since last visit, co-workers have noticed patient "falling asleep" on the job. No one available to talk to. Unclear if pts eyes open or closed during spells. Pt doesn't feel like he falls asleep. Has insomnia durign weekdays, but not during weekends. More stress related to job.  UPDATE  08/25/11: Doing well; no seizures.  Back to driving, but careful when he is tired (will ask others to drive).  Tolerating LEV and DPH.    PRIOR HPI:  70 year old left-handed male with history of seizures since the age of 70 years old, presenting for intermittent episodes of zoning out, difficulty with concentration and memory problems for the past 2-3 months.  Patient is accompanied by his sister today. Patient had a normal birth and development.  He was diagnosed with seizures at the age of 70 years old. He was started on chlordiazepoxide and phenytoin at age of 76 or 70 years old. He did have some difficulty with school having to repeat the fifth, sixth, seventh and eighth grades.  He ultimately graduated with a GED. Patient's typical seizures in the past involved distortion of sound were things felt like they were "miles away". He reports generalized convulsions where he was able to hear people around him but unable to speak. He denies tongue biting or incontinence. His last reported seizure was in 1987. Over the past 2-3 months he has been having episodes of "zoning out" at work. It had been happening 1- 2 times per week. He has no warning for these events.  He is unaware of these events. Reportedly coworkers called his name during these events he did not respond. On September 16 he was in a meeting and a coworker called his name, walked over and tapped his shoulder, he still did not respond for approximately 30 seconds. The patient's sister is asking if there are newer seizure medications which may have less  side effects than chlordiazepoxide and phenytoin.  There has been some gradual difficulty with concentration, thinking and memory over the past several years.   REVIEW OF SYSTEMS: Full 14 system review of systems performed and negative except as per HPI.   ALLERGIES: No Known Allergies  HOME MEDICATIONS: Outpatient Medications Prior to Visit  Medication Sig Dispense Refill  . acetaminophen  (TYLENOL) 500 MG tablet Take 500 mg by mouth every 6 (six) hours as needed for moderate pain or headache.    Marland Kitchen. alum & mag hydroxide-simeth (MINTOX) 200-200-20 MG/5ML suspension Take 30 mLs by mouth every 6 (six) hours as needed for indigestion or heartburn.    Marland Kitchen. amLODipine (NORVASC) 2.5 MG tablet Take 2.5 mg by mouth daily.    Marland Kitchen. aspirin 325 MG tablet Take 1 tablet (325 mg total) by mouth daily. 30 tablet 0  . atorvastatin (LIPITOR) 40 MG tablet Take 40 mg by mouth daily at 6 PM.     . carvedilol (COREG) 3.125 MG tablet Take 3.125 mg by mouth 2 (two) times daily with a meal.    . escitalopram (LEXAPRO) 5 MG tablet Take 5 mg by mouth daily.    Marland Kitchen. guaiFENesin (MUCINEX) 600 MG 12 hr tablet Take 1,200 mg by mouth 2 (two) times daily as needed for cough.    . insulin aspart (NOVOLOG FLEXPEN) 100 UNIT/ML FlexPen Inject into the skin as needed for high blood sugar. 02/16/16 sliding scale    . levETIRAcetam (KEPPRA) 500 MG tablet Take 500 mg by mouth 2 (two) times daily.      Marland Kitchen. lisinopril (PRINIVIL,ZESTRIL) 20 MG tablet Take 1 tablet (20 mg total) by mouth daily. 30 tablet 0  . loperamide (IMODIUM) 2 MG capsule Take 2 mg by mouth daily as needed for diarrhea or loose stools.    . magnesium hydroxide (MILK OF MAGNESIA) 400 MG/5ML suspension Take 30 mLs by mouth daily as needed for mild constipation.    . magnesium oxide (MAG-OX) 400 MG tablet Take 400 mg by mouth daily.    . metFORMIN (GLUCOPHAGE) 500 MG tablet Take 500 mg by mouth daily with breakfast.    . neomycin-bacitracin-polymyxin (NEOSPORIN) ointment Apply 1 application topically daily as needed for wound care. apply to eye    . pantoprazole (PROTONIX) 40 MG tablet Take 1 tablet (40 mg total) by mouth daily. 60 tablet 0  . phenytoin (DILANTIN) 100 MG ER capsule Take 3 capsules (300 mg total) by mouth daily. Start 06/23/2015 (Patient taking differently: Take 200 mg by mouth every morning. Take with 30 mg to equal 230 mg.) 30 capsule 0  . potassium  chloride SA (K-DUR,KLOR-CON) 20 MEQ tablet Take 20 mEq by mouth daily.     . risperiDONE (RISPERDAL) 1 MG tablet Take 1 mg by mouth at bedtime.    . tamsulosin (FLOMAX) 0.4 MG CAPS capsule Take 0.4 mg by mouth daily as needed (for urinary flow).     . phenytoin (DILANTIN) 30 MG ER capsule Take 30 mg by mouth every morning. Take with 200 mg to equal 230 mg.     No facility-administered medications prior to visit.     PAST MEDICAL HISTORY: Past Medical History:  Diagnosis Date  . Arthritis   . Chronic kidney disease   . Colon polyp 2005   Tubular Adenoma  . Coronary artery disease, non-occlusive    Minimal nonobstructive CAD, nl LV systolic fxn by cath 12/11/12  . Diabetes mellitus 2001   TYPE 2  . Diverticulosis   .  Epilepsy (HCC)   . GERD (gastroesophageal reflux disease) 04/23/2014  . Goiter, nontoxic, multinodular    Thyroid US 11/2012  . Hyperlipidemia 1999  . Hypertension 1999  . Internal hemorrhoids   . Neuromuscular disorder (HCC)   . Sleep apnea   . Splinter 04/27/13   metal plinter removed rt pointer finger  . Stroke (HCC) 06/2015  . Vision changes 06/2015   since CVA    PAST SURGICAL HISTORY: Past Surgical History:  Procedure Laterality Date  . CHOLECYSTECTOMY    . COLONOSCOPY  12/28/2009  . ESOPHAGOGASTRODUODENOSCOPY  10/14/2012   normal   . INGUINAL HERNIA REPAIR  06/27/11   left  . KNEE SURGERY  2008   Left  . LEFT HEART CATHETERIZATION WITH CORONARY ANGIOGRAM N/A 12/11/2012   Procedure: LEFT HEART CATHETERIZATION WITH CORONARY ANGIOGRAM;  Surgeon: Tonny Bollman, MD;  Location: [facility] CATH LAB;  Service: Cardiovascular;  Laterality: N/A;    FAMILY HISTORY: Family History  Problem Relation Age of Onset  . Heart disease Mother   . Alzheimer's disease Father   . Breast cancer Sister   . Colon cancer      unknown  . Diabetes Sister     SOCIAL HISTORY:  Social History   Social History  . Marital status: Divorced    Spouse name: N/A  . Number of children:  0  . Years of education: N/A   Occupational History  . Entrance assistant [place]   Social History Main Topics  . Smoking status: Former Smoker    Types: Cigars    Quit date: 04/14/1986  . Smokeless tobacco: Never Used     Comment: Quit cigars in the 1980s  . Alcohol use No  . Drug use: No  . Sexual activity: No   Other Topics Concern  . Not on file   Social History Narrative  . No narrative on file     PHYSICAL EXAM  GENERAL EXAM/CONSTITUTIONAL: Vitals:  Vitals:   08/14/16 1021  BP: 123/71  Pulse: 63  Weight: 186 lb 12.8 oz (84.7 kg)   Body mass index is 26.8 kg/m. No exam data present  Patient is in no distress; well developed, nourished and groomed; neck is supple  CARDIOVASCULAR:  Examination of carotid arteries is normal; no carotid bruits  Regular rate and rhythm, no murmurs  Examination of peripheral vascular system by observation and palpation is normal  EYES:  Ophthalmoscopic exam of optic discs and posterior segments is normal; no papilledema or hemorrhages  MUSCULOSKELETAL:  Gait, strength, tone, movements noted in Neurologic exam below  NEUROLOGIC: MENTAL STATUS:  No flowsheet data found.  awake, alert, oriented to person, place and time  recent and remote memory intact  normal attention and concentration  language fluent, comprehension intact, naming intact,   fund of knowledge appropriate  CRANIAL NERVE:   2nd - no papilledema on fundoscopic exam  2nd, 3rd, 4th, 6th - pupils equal and reactive to light, visual fields --> LEFT HOMONYMOUS HEMIANOPSIA, extraocular muscles intact, no nystagmus  5th - facial sensation symmetric  7th - facial strength symmetric  8th - hearing intact  9th - palate elevates symmetrically, uvula midline  11th - shoulder shrug symmetric  12th - tongue protrusion midline  MOTOR:   normal bulk and tone, full strength in the BUE, BLE; SLIGHTLY SLOWER ON LEFT SIDE  SENSORY:   normal and  symmetric to light touch, temperature, vibration  COORDINATION:   finger-nose-finger, fine finger movements normal  REFLEXES:   deep  tendon reflexes TRACE and symmetric  GAIT/STATION:   narrow based gait; USES A WALKER    DIAGNOSTIC DATA (LABS, IMAGING, TESTING) - I reviewed patient records, labs, notes, testing and imaging myself where available.  Lab Results  Component Value Date   WBC 9.2 01/30/2016   HGB 13.3 01/30/2016   HCT 37.6 (L) 01/30/2016   MCV 88.5 01/30/2016   PLT 174 01/30/2016      Component Value Date/Time   NA 136 01/30/2016 1845   K 4.0 01/30/2016 1845   CL 94 (L) 01/30/2016 1845   CO2 30 01/30/2016 1845   GLUCOSE 511 (H) 01/30/2016 1845   BUN 13 01/30/2016 1845   CREATININE 1.07 01/30/2016 1845   CALCIUM 9.8 01/30/2016 1845   PROT 6.9 06/16/2015 0212   ALBUMIN 3.6 06/18/2015 1124   AST 27 06/16/2015 0212   ALT 17 06/16/2015 0212   ALKPHOS 125 06/16/2015 0212   BILITOT 0.7 06/16/2015 0212   GFRNONAA >60 01/30/2016 1845   GFRAA >60 01/30/2016 1845   Lab Results  Component Value Date   CHOL 101 06/16/2015   HDL 33 (L) 06/16/2015   LDLCALC 51 06/16/2015   TRIG 85 06/16/2015   CHOLHDL 3.1 06/16/2015   Lab Results  Component Value Date   HGBA1C 7.0 (H) 06/16/2015   Lab Results  Component Value Date   VITAMINB12 487 06/20/2015   Lab Results  Component Value Date   TSH 0.800 12/12/2012   06/16/15 MRI brain [I reviewed images myself and agree with interpretation. -VRP]  1. Limited study with only diffusion-weighted sequences performed. 2. Acute ischemic right PCA territory infarct without significant mass effect. 3. Additional small 6 mm acute ischemic infarct within the left occipital lobe. 4. 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 5. Small right frontal scalp contusion.  06/17/15 CTA head/neck [I reviewed images myself and agree with interpretation. -VRP]  1. Moderate  proximal right PCA stenosis corresponds with the right occipital lobe infarct. 2. No significant change in the size the right occipital lobe infarct or hemorrhage. 3. Remote encephalomalacia of the anterior left frontal lobe. 4. Midline cyst on the left with mass effect as described. 5. Mild diffuse small vessel disease evident on the MRA. 6. Multilevel degenerative changes throughout the cervical spine are most pronounced at C3-4 and C5-6. 7. Atherosclerotic changes within the carotid bifurcations bilaterally without significant stenosis.  06/17/15 TTE - Left ventricle: The cavity size was normal. There was moderate concentric hypertrophy. Systolic function was normal. Theestimated ejection fraction was in the range of 60% to 65%. Wall motion was normal; there were no regional wall motionabnormalities. Doppler parameters are consistent with abnormalleft ventricular relaxation (grade 1 diastolic dysfunction).  06/24/15 cardiac monitor - On event monitor no PAF, some other fast HR but nothing to cause stroke.   09/10/15 CT head [I reviewed images myself and agree with interpretation. -VRP]  - No evidence of acute intracranial abnormality. - Encephalomalacic changes related to prior right PCA distribution infarct. - Additional stable chronic changes, as above.    ASSESSMENT AND PLAN  70 y.o. year old male here with stroke (right > left PCA), possibly due to intracranial atherosclerosis vs cardio-embolic. Also with seizure disorder (on LEV and DPH). Overall stable.    Dx:  Cerebrovascular accident (CVA) due to bilateral embolism of posterior cerebral arteries (HCC)  Seizure disorder (HCC)    PLAN: - secondary stroke prevention (aspirin 325mg  daily, statin, BP control, diabetes control) - continue (  levetiracetam 500mg  twice a day + dilantin 200mg  daily) for seizure prevention  Return in about 1 year (around 08/14/2017).    Suanne Marker, MD 08/14/2016, 10:45 AM Certified in  Neurology, Neurophysiology and Neuroimaging  [facility] [address] [place], [place] [address] [phone]

## 2016-08-24 DIAGNOSIS — B351 Tinea unguium: Secondary | ICD-10-CM | POA: Diagnosis not present

## 2016-08-24 DIAGNOSIS — M79674 Pain in right toe(s): Secondary | ICD-10-CM | POA: Diagnosis not present

## 2016-08-24 DIAGNOSIS — M79675 Pain in left toe(s): Secondary | ICD-10-CM | POA: Diagnosis not present

## 2016-11-02 DIAGNOSIS — R300 Dysuria: Secondary | ICD-10-CM | POA: Diagnosis not present

## 2016-11-08 DIAGNOSIS — Z6827 Body mass index (BMI) 27.0-27.9, adult: Secondary | ICD-10-CM | POA: Diagnosis not present

## 2016-11-08 DIAGNOSIS — G4709 Other insomnia: Secondary | ICD-10-CM | POA: Diagnosis not present

## 2016-11-08 DIAGNOSIS — I638 Other cerebral infarction: Secondary | ICD-10-CM | POA: Diagnosis not present

## 2016-11-08 DIAGNOSIS — I10 Essential (primary) hypertension: Secondary | ICD-10-CM | POA: Diagnosis not present

## 2016-11-08 DIAGNOSIS — R569 Unspecified convulsions: Secondary | ICD-10-CM | POA: Diagnosis not present

## 2016-11-08 DIAGNOSIS — R413 Other amnesia: Secondary | ICD-10-CM | POA: Diagnosis not present

## 2016-11-08 DIAGNOSIS — E119 Type 2 diabetes mellitus without complications: Secondary | ICD-10-CM | POA: Diagnosis not present

## 2016-11-08 DIAGNOSIS — R972 Elevated prostate specific antigen [PSA]: Secondary | ICD-10-CM | POA: Diagnosis not present

## 2016-12-01 DIAGNOSIS — I10 Essential (primary) hypertension: Secondary | ICD-10-CM | POA: Diagnosis not present

## 2016-12-01 DIAGNOSIS — R413 Other amnesia: Secondary | ICD-10-CM | POA: Diagnosis not present

## 2016-12-01 DIAGNOSIS — E119 Type 2 diabetes mellitus without complications: Secondary | ICD-10-CM | POA: Diagnosis not present

## 2016-12-01 DIAGNOSIS — Z6827 Body mass index (BMI) 27.0-27.9, adult: Secondary | ICD-10-CM | POA: Diagnosis not present

## 2016-12-01 DIAGNOSIS — R972 Elevated prostate specific antigen [PSA]: Secondary | ICD-10-CM | POA: Diagnosis not present

## 2016-12-01 DIAGNOSIS — R569 Unspecified convulsions: Secondary | ICD-10-CM | POA: Diagnosis not present

## 2016-12-01 DIAGNOSIS — M1990 Unspecified osteoarthritis, unspecified site: Secondary | ICD-10-CM | POA: Diagnosis not present

## 2016-12-05 DIAGNOSIS — B351 Tinea unguium: Secondary | ICD-10-CM | POA: Diagnosis not present

## 2016-12-05 DIAGNOSIS — M79674 Pain in right toe(s): Secondary | ICD-10-CM | POA: Diagnosis not present

## 2016-12-05 DIAGNOSIS — M79675 Pain in left toe(s): Secondary | ICD-10-CM | POA: Diagnosis not present

## 2016-12-11 DIAGNOSIS — R278 Other lack of coordination: Secondary | ICD-10-CM | POA: Diagnosis not present

## 2016-12-11 DIAGNOSIS — R2681 Unsteadiness on feet: Secondary | ICD-10-CM | POA: Diagnosis not present

## 2017-03-02 DIAGNOSIS — B351 Tinea unguium: Secondary | ICD-10-CM | POA: Diagnosis not present

## 2017-03-02 DIAGNOSIS — M79674 Pain in right toe(s): Secondary | ICD-10-CM | POA: Diagnosis not present

## 2017-03-02 DIAGNOSIS — M79675 Pain in left toe(s): Secondary | ICD-10-CM | POA: Diagnosis not present

## 2017-03-21 DIAGNOSIS — R972 Elevated prostate specific antigen [PSA]: Secondary | ICD-10-CM | POA: Diagnosis not present

## 2017-03-21 DIAGNOSIS — I10 Essential (primary) hypertension: Secondary | ICD-10-CM | POA: Diagnosis not present

## 2017-03-21 DIAGNOSIS — Z6827 Body mass index (BMI) 27.0-27.9, adult: Secondary | ICD-10-CM | POA: Diagnosis not present

## 2017-03-21 DIAGNOSIS — I638 Other cerebral infarction: Secondary | ICD-10-CM | POA: Diagnosis not present

## 2017-03-21 DIAGNOSIS — E784 Other hyperlipidemia: Secondary | ICD-10-CM | POA: Diagnosis not present

## 2017-03-21 DIAGNOSIS — E119 Type 2 diabetes mellitus without complications: Secondary | ICD-10-CM | POA: Diagnosis not present

## 2017-03-21 DIAGNOSIS — R413 Other amnesia: Secondary | ICD-10-CM | POA: Diagnosis not present

## 2017-03-21 DIAGNOSIS — F3289 Other specified depressive episodes: Secondary | ICD-10-CM | POA: Diagnosis not present

## 2017-03-21 DIAGNOSIS — R569 Unspecified convulsions: Secondary | ICD-10-CM | POA: Diagnosis not present

## 2017-04-09 ENCOUNTER — Emergency Department (HOSPITAL_COMMUNITY)
Admission: EM | Admit: 2017-04-09 | Discharge: 2017-04-09 | Disposition: A | Discharge status: Home / Self Care | Payer: Medicare Other | Attending: Emergency Medicine | Admitting: Emergency Medicine

## 2017-04-09 ENCOUNTER — Encounter (HOSPITAL_COMMUNITY): Payer: Self-pay | Admitting: Emergency Medicine

## 2017-04-09 ENCOUNTER — Emergency Department (HOSPITAL_COMMUNITY): Payer: Medicare Other

## 2017-04-09 DIAGNOSIS — E1122 Type 2 diabetes mellitus with diabetic chronic kidney disease: Secondary | ICD-10-CM | POA: Insufficient documentation

## 2017-04-09 DIAGNOSIS — S0990XA Unspecified injury of head, initial encounter: Secondary | ICD-10-CM | POA: Diagnosis not present

## 2017-04-09 DIAGNOSIS — S0181XA Laceration without foreign body of other part of head, initial encounter: Secondary | ICD-10-CM | POA: Diagnosis not present

## 2017-04-09 DIAGNOSIS — Z79899 Other long term (current) drug therapy: Secondary | ICD-10-CM | POA: Insufficient documentation

## 2017-04-09 DIAGNOSIS — Z794 Long term (current) use of insulin: Secondary | ICD-10-CM | POA: Diagnosis not present

## 2017-04-09 DIAGNOSIS — R930 Abnormal findings on diagnostic imaging of skull and head, not elsewhere classified: Secondary | ICD-10-CM | POA: Insufficient documentation

## 2017-04-09 DIAGNOSIS — W19XXXA Unspecified fall, initial encounter: Secondary | ICD-10-CM | POA: Diagnosis not present

## 2017-04-09 DIAGNOSIS — Y92003 Bedroom of unspecified non-institutional (private) residence as the place of occurrence of the external cause: Secondary | ICD-10-CM | POA: Diagnosis not present

## 2017-04-09 DIAGNOSIS — Y939 Activity, unspecified: Secondary | ICD-10-CM | POA: Insufficient documentation

## 2017-04-09 DIAGNOSIS — Z8673 Personal history of transient ischemic attack (TIA), and cerebral infarction without residual deficits: Secondary | ICD-10-CM | POA: Diagnosis not present

## 2017-04-09 DIAGNOSIS — S0081XA Abrasion of other part of head, initial encounter: Secondary | ICD-10-CM | POA: Insufficient documentation

## 2017-04-09 DIAGNOSIS — Z7982 Long term (current) use of aspirin: Secondary | ICD-10-CM | POA: Insufficient documentation

## 2017-04-09 DIAGNOSIS — Z87891 Personal history of nicotine dependence: Secondary | ICD-10-CM | POA: Diagnosis not present

## 2017-04-09 DIAGNOSIS — N189 Chronic kidney disease, unspecified: Secondary | ICD-10-CM | POA: Diagnosis not present

## 2017-04-09 DIAGNOSIS — Y999 Unspecified external cause status: Secondary | ICD-10-CM | POA: Diagnosis not present

## 2017-04-09 DIAGNOSIS — I2510 Atherosclerotic heart disease of native coronary artery without angina pectoris: Secondary | ICD-10-CM | POA: Diagnosis not present

## 2017-04-09 DIAGNOSIS — R51 Headache: Secondary | ICD-10-CM | POA: Diagnosis not present

## 2017-04-09 DIAGNOSIS — M542 Cervicalgia: Secondary | ICD-10-CM | POA: Diagnosis not present

## 2017-04-09 DIAGNOSIS — I129 Hypertensive chronic kidney disease with stage 1 through stage 4 chronic kidney disease, or unspecified chronic kidney disease: Secondary | ICD-10-CM | POA: Insufficient documentation

## 2017-04-09 DIAGNOSIS — F0390 Unspecified dementia without behavioral disturbance: Secondary | ICD-10-CM | POA: Diagnosis not present

## 2017-04-09 DIAGNOSIS — R410 Disorientation, unspecified: Secondary | ICD-10-CM | POA: Diagnosis not present

## 2017-04-09 DIAGNOSIS — S199XXA Unspecified injury of neck, initial encounter: Secondary | ICD-10-CM | POA: Diagnosis not present

## 2017-04-09 NOTE — ED Provider Notes (Signed)
[facility] DEPT Provider Note   CSN: [id number] Arrival date & time: 04/09/17  7829     History   Chief Complaint Chief Complaint  Patient presents with  . Fall  . Head Injury    HPI WON KREUZER is a 71 y.o. male.  The history is provided by the EMS personnel and medical records. The history is limited by the condition of the patient (level 5 caveat for dementia).  Fall  This is a new problem. The current episode started less than 1 hour ago. The problem occurs constantly. The problem has not changed since onset.Pertinent negatives include no chest pain, no abdominal pain, no headaches and no shortness of breath. Nothing aggravates the symptoms. Nothing relieves the symptoms. He has tried nothing for the symptoms. The treatment provided no relief.  Head Injury      Past Medical History:  Diagnosis Date  . Arthritis   . Chronic kidney disease   . Colon polyp 2005   Tubular Adenoma  . Coronary artery disease, non-occlusive    Minimal nonobstructive CAD, nl LV systolic fxn by cath 12/11/12  . Diabetes mellitus 2001   TYPE 2  . Diverticulosis   . Epilepsy (HCC)   . GERD (gastroesophageal reflux disease) 04/23/2014  . Goiter, nontoxic, multinodular    Thyroid US 11/2012  . Hyperlipidemia 1999  . Hypertension 1999  . Internal hemorrhoids   . Neuromuscular disorder (HCC)   . Sleep apnea   . Splinter 04/27/13   metal plinter removed rt pointer finger  . Stroke (HCC) 06/2015  . Vision changes 06/2015   since CVA    Patient Active Problem List   Diagnosis Date Noted  . Type 2 diabetes mellitus with other circulatory complications (HCC) 06/19/2015  . Thrombocytopenia (HCC) 06/19/2015  . Cerebral infarction due to unspecified mechanism   . Dilantin toxicity 06/18/2015  . Phenytoin toxicity   . Stroke (HCC) 06/16/2015  . Fall 06/16/2015  . Epilepsy (HCC) 06/16/2015  . BPH (benign prostatic hyperplasia) 06/16/2015  . Leukocytosis 06/16/2015  . CVA (cerebral  infarction) 06/16/2015  . CVA (cerebral vascular accident) (HCC) 06/15/2015  . GERD (gastroesophageal reflux disease) 04/23/2014  . Potassium (K) deficiency 12/11/2012  . Precordial pain 12/11/2012  . Dehydration 04/15/2012  . AKI (acute kidney injury) (HCC) 04/14/2012  . HLD (hyperlipidemia) 06/01/2010  . Essential hypertension, benign 06/01/2010  . DYSPNEA 06/01/2010  . Diabetes mellitus without complication (HCC) 11/04/2009  . RECTAL BLEEDING 11/04/2009  . PERSONAL HX COLONIC POLYPS 11/04/2009    Past Surgical History:  Procedure Laterality Date  . CHOLECYSTECTOMY    . COLONOSCOPY  12/28/2009  . ESOPHAGOGASTRODUODENOSCOPY  10/14/2012   normal   . INGUINAL HERNIA REPAIR  06/27/11   left  . KNEE SURGERY  2008   Left  . LEFT HEART CATHETERIZATION WITH CORONARY ANGIOGRAM N/A 12/11/2012   Procedure: LEFT HEART CATHETERIZATION WITH CORONARY ANGIOGRAM;  Surgeon: Tonny Bollman, MD;  Location: [facility] CATH LAB;  Service: Cardiovascular;  Laterality: N/A;       Home Medications    Prior to Admission medications   Medication Sig Start Date End Date Taking? Authorizing Provider  acetaminophen (TYLENOL) 500 MG tablet Take 500 mg by mouth every 6 (six) hours as needed for moderate pain or headache.   Yes [provider]  alum & mag hydroxide-simeth (MINTOX) 200-200-20 MG/5ML suspension Take 30 mLs by mouth every 6 (six) hours as needed for indigestion or heartburn.   Yes [provider]  amLODipine (NORVASC)  2.5 MG tablet Take 2.5 mg by mouth daily.   Yes [provider]  aspirin 325 MG tablet Take 1 tablet (325 mg total) by mouth daily. 06/22/15  Yes Tat, Onalee Hua, MD  atorvastatin (LIPITOR) 40 MG tablet Take 40 mg by mouth daily at 6 PM.    Yes [provider]  carvedilol (COREG) 3.125 MG tablet Take 3.125 mg by mouth 2 (two) times daily with a meal.   Yes [provider]  Cholecalciferol (VITAMIN D3) 5000 units TABS Take 5,000 Units by mouth daily.     Yes [provider]  escitalopram (LEXAPRO) 5 MG tablet Take 10 mg by mouth daily.    Yes [provider]  galantamine (RAZADYNE ER) 16 MG 24 hr capsule Take 16 mg by mouth daily with breakfast.   Yes [provider]  insulin aspart (NOVOLOG FLEXPEN) 100 UNIT/ML FlexPen Inject 0-14 Units into the skin 2 (two) times daily. If sugar less than 120 units, no insulin.  121-150= 2 units  151-180= 3 units 181-210= 4 units 211-250= 5 units 251-280= 6 units 281-330= 8 units 331-400= 10 units If BS is greater than 400 inject 14 units   Yes [provider]  LANTUS SOLOSTAR 100 UNIT/ML Solostar Pen Inject 17 Units into the skin daily. In am 07/06/16  Yes [provider]  levETIRAcetam (KEPPRA) 500 MG tablet Take 500 mg by mouth 2 (two) times daily.     Yes [provider]  lisinopril (PRINIVIL,ZESTRIL) 20 MG tablet Take 1 tablet (20 mg total) by mouth daily. 06/22/15  Yes Tat, Onalee Hua, MD  loperamide (IMODIUM) 2 MG capsule Take 2 mg by mouth daily as needed for diarrhea or loose stools.   Yes [provider]  magnesium hydroxide (MILK OF MAGNESIA) 400 MG/5ML suspension Take 30 mLs by mouth daily as needed for mild constipation.   Yes [provider]  magnesium oxide (MAG-OX) 400 MG tablet Take 400 mg by mouth daily.   Yes [provider]  Melatonin 3 MG TABS Take 3 mg by mouth at bedtime.   Yes [provider]  metFORMIN (GLUCOPHAGE) 500 MG tablet Take 500 mg by mouth 2 (two) times daily.    Yes [provider]  mirtazapine (REMERON) 15 MG tablet Take 15 mg by mouth at bedtime.   Yes [provider]  neomycin-bacitracin-polymyxin (NEOSPORIN) ointment Apply 1 application topically daily as needed for wound care. apply to eye   Yes [provider]  pantoprazole (PROTONIX) 40 MG tablet Take 1 tablet (40 mg total) by mouth daily. 06/22/15  Yes Tat, Onalee Hua, MD  phenytoin (DILANTIN) 100 MG ER capsule  Take 3 capsules (300 mg total) by mouth daily. Start 06/23/2015 Patient taking differently: Take 200 mg by mouth every morning.  06/23/15  Yes Tat, Onalee Hua, MD  potassium chloride SA (K-DUR,KLOR-CON) 20 MEQ tablet Take 20 mEq by mouth daily.    Yes [provider]  risperiDONE (RISPERDAL) 1 MG tablet Take 1 mg by mouth at bedtime.   Yes [provider]    Family History Family History  Problem Relation Age of Onset  . Heart disease Mother   . Alzheimer's disease Father   . Breast cancer Sister   . Colon cancer Unknown        unknown  . Diabetes Sister     Social History Social History  Substance Use Topics  . Smoking status: Former Smoker    Types: Cigars    Quit date: 04/14/1986  .  Smokeless tobacco: Never Used     Comment: Quit cigars in the 1980s  . Alcohol use No     Allergies   Patient has no known allergies.   Review of Systems Review of Systems  Respiratory: Negative for shortness of breath.   Cardiovascular: Negative for chest pain.  Gastrointestinal: Negative for abdominal pain.  Musculoskeletal:       Abrasion to the forehead  Neurological: Negative for headaches.  All other systems reviewed and are negative.    Physical Exam Updated Vital Signs BP (!) 160/80   Pulse (!) 56   Temp 98.6 F (37 C) (Oral)   Resp 18   SpO2 98%   Physical Exam  Constitutional: He is oriented to person, place, and time. He appears well-developed and well-nourished. No distress.  HENT:  Head: Normocephalic. Head is without raccoon's eyes and without Battle's sign.  Right Ear: No hemotympanum.  Left Ear: No hemotympanum.  Mouth/Throat: No oropharyngeal exudate.  Eyes: Conjunctivae are normal. Pupils are equal, round, and reactive to light.  Neck: No JVD present. No tracheal deviation present.  Cardiovascular: Normal rate, regular rhythm, normal heart sounds and intact distal pulses.   Pulmonary/Chest: Effort normal and breath sounds normal. No stridor. He  has no wheezes. He has no rales.  Abdominal: Soft. Bowel sounds are normal. He exhibits no mass. There is no tenderness. There is no rebound and no guarding.  Musculoskeletal: Normal range of motion. He exhibits no edema, tenderness or deformity.       Right hip: Normal.       Left hip: Normal.       Right knee: Normal.       Left knee: Normal.       Right ankle: Normal. Achilles tendon normal.       Left ankle: Normal. Achilles tendon normal.  Neurological: He is alert and oriented to person, place, and time. He displays normal reflexes. He exhibits normal muscle tone.  Skin: Skin is warm and dry. Capillary refill takes less than 2 seconds.     ED Treatments / Results   Vitals:   04/09/17 0530 04/09/17 0600  BP: (!) 144/82 (!) 160/80  Pulse: (!) 54 (!) 56  Resp:    Temp:      Radiology Ct Head Wo Contrast  Result Date: 04/09/2017 CLINICAL DATA:  Unwitnessed fall in bedroom. Forehead hematoma and headache. History of stroke, hypertension, diabetes and epilepsy. EXAM: CT HEAD WITHOUT CONTRAST CT CERVICAL SPINE WITHOUT CONTRAST TECHNIQUE: Multidetector CT imaging of the head and cervical spine was performed following the standard protocol without intravenous contrast. Multiplanar CT image reconstructions of the cervical spine were also generated. COMPARISON:  CT HEAD September 02, 2015 and CT cervical spine June 15, 2015 and MRI of the head July 04, 2012 FINDINGS: CT HEAD FINDINGS BRAIN: No intraparenchymal hemorrhage, mass effect, midline shift or acute large vascular territory infarcts. Severe ventriculomegaly with porencephaly, similar LEFT greater than RIGHT interhemispheric cysts. Agenesis of the corpus callosum. LEFT frontal cortical dysplasia better seen on prior MRI. Old RIGHT occipital lobe infarct. Mild cerebellar volume loss likely due to chronic seizure medication. No abnormal extra-axial fluid collections. Basal cisterns are patent. VASCULAR: Mild calcific atherosclerosis of  the carotid siphons. SKULL: No skull fracture. Mild RIGHT temporomandibular osteoarthrosis. Small frontal scalp hematoma without subcutaneous gas or radiopaque foreign bodies. SINUSES/ORBITS: Trace paranasal sinus mucosal thickening without air-fluid levels. Mastoid air cells are well aerated.The included ocular globes and orbital contents are non-suspicious. OTHER: None.  CT CERVICAL SPINE FINDINGS ALIGNMENT: Maintained lordosis. Vertebral bodies in alignment. SKULL BASE AND VERTEBRAE: Cervical vertebral bodies and posterior elements are intact. Moderate C3-4, C5-6 and C6-7 disc height loss with similar endplate sclerosis and marginal spurring compatible with degenerative discs. Stable irregular sclerosis C3 most compatible with atypical hemangioma. C1-2 articulation maintained with moderate arthropathy. SOFT TISSUES AND SPINAL CANAL: Nonacute. Bulky nuchal ligament calcification. Mild calcific atherosclerosis of the carotid bifurcations. Thyromegaly and multiple calcifications most compatible with multinodular goiter. DISC LEVELS: Moderate C3-4 canal stenosis, mild at C4-5. Severe C3-4 thru C6-7 neural foraminal narrowing. UPPER CHEST: Lung apices are clear. OTHER: None. IMPRESSION: CT HEAD: Small frontal scalp hematoma without skull fracture. No acute intracranial process. Stable examination including agenesis of the corpus callosum with LEFT frontal cortical dysplasia and interhemispheric cyst. CT CERVICAL SPINE: No acute fracture or malalignment. Stable degenerative change. Electronically Signed   By: Awilda Metroourtnay  Bloomer M.D.   On: 04/09/2017 05:48   Ct Cervical Spine Wo Contrast  Result Date: 04/09/2017 CLINICAL DATA:  Unwitnessed fall in bedroom. Forehead hematoma and headache. History of stroke, hypertension, diabetes and epilepsy. EXAM: CT HEAD WITHOUT CONTRAST CT CERVICAL SPINE WITHOUT CONTRAST TECHNIQUE: Multidetector CT imaging of the head and cervical spine was performed following the standard protocol  without intravenous contrast. Multiplanar CT image reconstructions of the cervical spine were also generated. COMPARISON:  CT HEAD September 02, 2015 and CT cervical spine June 15, 2015 and MRI of the head July 04, 2012 FINDINGS: CT HEAD FINDINGS BRAIN: No intraparenchymal hemorrhage, mass effect, midline shift or acute large vascular territory infarcts. Severe ventriculomegaly with porencephaly, similar LEFT greater than RIGHT interhemispheric cysts. Agenesis of the corpus callosum. LEFT frontal cortical dysplasia better seen on prior MRI. Old RIGHT occipital lobe infarct. Mild cerebellar volume loss likely due to chronic seizure medication. No abnormal extra-axial fluid collections. Basal cisterns are patent. VASCULAR: Mild calcific atherosclerosis of the carotid siphons. SKULL: No skull fracture. Mild RIGHT temporomandibular osteoarthrosis. Small frontal scalp hematoma without subcutaneous gas or radiopaque foreign bodies. SINUSES/ORBITS: Trace paranasal sinus mucosal thickening without air-fluid levels. Mastoid air cells are well aerated.The included ocular globes and orbital contents are non-suspicious. OTHER: None. CT CERVICAL SPINE FINDINGS ALIGNMENT: Maintained lordosis. Vertebral bodies in alignment. SKULL BASE AND VERTEBRAE: Cervical vertebral bodies and posterior elements are intact. Moderate C3-4, C5-6 and C6-7 disc height loss with similar endplate sclerosis and marginal spurring compatible with degenerative discs. Stable irregular sclerosis C3 most compatible with atypical hemangioma. C1-2 articulation maintained with moderate arthropathy. SOFT TISSUES AND SPINAL CANAL: Nonacute. Bulky nuchal ligament calcification. Mild calcific atherosclerosis of the carotid bifurcations. Thyromegaly and multiple calcifications most compatible with multinodular goiter. DISC LEVELS: Moderate C3-4 canal stenosis, mild at C4-5. Severe C3-4 thru C6-7 neural foraminal narrowing. UPPER CHEST: Lung apices are clear. OTHER:  None. IMPRESSION: CT HEAD: Small frontal scalp hematoma without skull fracture. No acute intracranial process. Stable examination including agenesis of the corpus callosum with LEFT frontal cortical dysplasia and interhemispheric cyst. CT CERVICAL SPINE: No acute fracture or malalignment. Stable degenerative change. Electronically Signed   By: Awilda Metroourtnay  Bloomer M.D.   On: 04/09/2017 05:48    Procedures Procedures (including critical care time)    Final Clinical Impressions(s) / ED Diagnoses   Final diagnoses:  Fall, initial encounter   Return immediately for fever >101, altered level of consciousness, bleeding or any concerns. Follow up with your own doctor for ongoing concerns.   The patient is nontoxic-appearing on exam and vital signs are within normal  limits.   I have reviewed the triage vital signs and the nursing notes. Pertinent labs &imaging results that were available during my care of the patient were reviewed by me and considered in my medical decision making (see chart for details).  After history, exam, and medical workup I feel the patient has been appropriately medically screened and is safe for discharge home. Pertinent diagnoses were discussed with the patient. Patient was given return precautions.      Thomasena Vandenheuvel, MD 04/09/17 [phone]

## 2017-04-09 NOTE — ED Notes (Signed)
[facility] and was made aware of pt's condition and discharge.

## 2017-04-09 NOTE — ED Notes (Signed)
[facility] was called for pt's transportation back to [facility]. 

## 2017-04-09 NOTE — ED Notes (Signed)
Bed: [id number] Expected date:  Expected time:  Means of arrival:  Comments: 71 m fall

## 2017-04-09 NOTE — ED Triage Notes (Signed)
[facility] with [facility]/o head injury after his unwitnessed fall this morning.  Pt was found lying on floor by his bedroom door.  Pt sustained small hematoma on forehead, between eyebrows--- pt reported "a little headache", no dizziness or nausea.   Pt also c/o lower back pain, able to move all extremities adequately.  Pt on ASA 325 mg daily.

## 2017-04-09 NOTE — ED Notes (Signed)
Pt awake at this time and offered toileting---- pt able to ambulate well with walker in the hall and to the bathroom.

## 2017-04-09 NOTE — ED Notes (Signed)
[facility] here to transport pt back to [facility]. 

## 2017-04-25 DIAGNOSIS — R21 Rash and other nonspecific skin eruption: Secondary | ICD-10-CM | POA: Diagnosis not present

## 2017-04-25 DIAGNOSIS — Z6827 Body mass index (BMI) 27.0-27.9, adult: Secondary | ICD-10-CM | POA: Diagnosis not present

## 2017-05-04 ENCOUNTER — Emergency Department (HOSPITAL_COMMUNITY): Payer: Medicare Other

## 2017-05-04 ENCOUNTER — Encounter (HOSPITAL_COMMUNITY): Payer: Self-pay | Admitting: Emergency Medicine

## 2017-05-04 ENCOUNTER — Emergency Department (HOSPITAL_COMMUNITY)
Admission: EM | Admit: 2017-05-04 | Discharge: 2017-05-04 | Disposition: A | Discharge status: Skilled Nursing Facility | Payer: Medicare Other | Attending: Emergency Medicine | Admitting: Emergency Medicine

## 2017-05-04 DIAGNOSIS — S99922A Unspecified injury of left foot, initial encounter: Secondary | ICD-10-CM | POA: Diagnosis not present

## 2017-05-04 DIAGNOSIS — E1122 Type 2 diabetes mellitus with diabetic chronic kidney disease: Secondary | ICD-10-CM | POA: Insufficient documentation

## 2017-05-04 DIAGNOSIS — Y92129 Unspecified place in nursing home as the place of occurrence of the external cause: Secondary | ICD-10-CM | POA: Insufficient documentation

## 2017-05-04 DIAGNOSIS — Z7984 Long term (current) use of oral hypoglycemic drugs: Secondary | ICD-10-CM | POA: Diagnosis not present

## 2017-05-04 DIAGNOSIS — Y999 Unspecified external cause status: Secondary | ICD-10-CM | POA: Diagnosis not present

## 2017-05-04 DIAGNOSIS — F0390 Unspecified dementia without behavioral disturbance: Secondary | ICD-10-CM | POA: Diagnosis not present

## 2017-05-04 DIAGNOSIS — I129 Hypertensive chronic kidney disease with stage 1 through stage 4 chronic kidney disease, or unspecified chronic kidney disease: Secondary | ICD-10-CM | POA: Insufficient documentation

## 2017-05-04 DIAGNOSIS — R2689 Other abnormalities of gait and mobility: Secondary | ICD-10-CM | POA: Diagnosis not present

## 2017-05-04 DIAGNOSIS — M79672 Pain in left foot: Secondary | ICD-10-CM | POA: Diagnosis not present

## 2017-05-04 DIAGNOSIS — Z794 Long term (current) use of insulin: Secondary | ICD-10-CM | POA: Insufficient documentation

## 2017-05-04 DIAGNOSIS — S0990XA Unspecified injury of head, initial encounter: Secondary | ICD-10-CM

## 2017-05-04 DIAGNOSIS — Z87891 Personal history of nicotine dependence: Secondary | ICD-10-CM | POA: Insufficient documentation

## 2017-05-04 DIAGNOSIS — S24109A Unspecified injury at unspecified level of thoracic spinal cord, initial encounter: Secondary | ICD-10-CM | POA: Diagnosis not present

## 2017-05-04 DIAGNOSIS — T148XXA Other injury of unspecified body region, initial encounter: Secondary | ICD-10-CM | POA: Diagnosis not present

## 2017-05-04 DIAGNOSIS — W19XXXA Unspecified fall, initial encounter: Secondary | ICD-10-CM | POA: Insufficient documentation

## 2017-05-04 DIAGNOSIS — I2510 Atherosclerotic heart disease of native coronary artery without angina pectoris: Secondary | ICD-10-CM | POA: Diagnosis not present

## 2017-05-04 DIAGNOSIS — N189 Chronic kidney disease, unspecified: Secondary | ICD-10-CM | POA: Diagnosis not present

## 2017-05-04 DIAGNOSIS — S91115A Laceration without foreign body of left lesser toe(s) without damage to nail, initial encounter: Secondary | ICD-10-CM

## 2017-05-04 DIAGNOSIS — Y939 Activity, unspecified: Secondary | ICD-10-CM | POA: Insufficient documentation

## 2017-05-04 DIAGNOSIS — Z79899 Other long term (current) drug therapy: Secondary | ICD-10-CM | POA: Insufficient documentation

## 2017-05-04 DIAGNOSIS — Z23 Encounter for immunization: Secondary | ICD-10-CM | POA: Insufficient documentation

## 2017-05-04 DIAGNOSIS — S91114A Laceration without foreign body of right lesser toe(s) without damage to nail, initial encounter: Secondary | ICD-10-CM | POA: Insufficient documentation

## 2017-05-04 DIAGNOSIS — S199XXA Unspecified injury of neck, initial encounter: Secondary | ICD-10-CM | POA: Diagnosis not present

## 2017-05-04 DIAGNOSIS — S86191A Other injury of other muscle(s) and tendon(s) of posterior muscle group at lower leg level, right leg, initial encounter: Secondary | ICD-10-CM | POA: Diagnosis not present

## 2017-05-04 MED ORDER — LIDOCAINE HCL (PF) 1 % IJ SOLN
5.0000 mL | Freq: Once | INTRAMUSCULAR | Status: AC
  Administered 2017-05-04: 5 mL via INTRADERMAL
  Filled 2017-05-04: qty 5

## 2017-05-04 MED ORDER — TETANUS-DIPHTH-ACELL PERTUSSIS 5-2.5-18.5 LF-MCG/0.5 IM SUSP
0.5000 mL | Freq: Once | INTRAMUSCULAR | Status: AC
  Administered 2017-05-04: 0.5 mL via INTRAMUSCULAR
  Filled 2017-05-04: qty 0.5

## 2017-05-04 NOTE — ED Provider Notes (Signed)
By signing my name below, I, Rosana Fretana Waskiewicz, attest that this documentation has been prepared under the direction and in the presence of Katalyna Socarras, Layla MawKristen N, DO. Electronically Signed: Rosana Fretana Waskiewicz, ED Scribe. 05/04/17. 2:36 AM.  TIME SEEN: 2:33 AM  CHIEF COMPLAINT: fall  HPI: HPI Comments: Arthur Berry is a 71 y.o. male with a PMHx of dementia, who presents to the Emergency Department via EMS complaining of a mechanical, ground-level fall that occurred this evening at his nursing home. Fall was unwitnessed. Pt denies any pain. He has a laceration to the left fifth toe. No use of blood thinners. Dressing placed by EMS to stop the bleeding but no other treatments tried. Pt denies headache, chest pain, abdominal pain or any other complaints at this time.   ROS: Level V caveat for dementia  PAST MEDICAL HISTORY/PAST SURGICAL HISTORY:  Past Medical History:  Diagnosis Date  . Arthritis   . Chronic kidney disease   . Colon polyp 2005   Tubular Adenoma  . Coronary artery disease, non-occlusive    Minimal nonobstructive CAD, nl LV systolic fxn by cath 12/11/12  . Diabetes mellitus 2001   TYPE 2  . Diverticulosis   . Epilepsy (HCC)   . GERD (gastroesophageal reflux disease) 04/23/2014  . Goiter, nontoxic, multinodular    Thyroid US 11/2012  . Hyperlipidemia 1999  . Hypertension 1999  . Internal hemorrhoids   . Neuromuscular disorder (HCC)   . Sleep apnea   . Splinter 04/27/13   metal plinter removed rt pointer finger  . Stroke (HCC) 06/2015  . Vision changes 06/2015   since CVA    MEDICATIONS:  Prior to Admission medications   Medication Sig Start Date End Date Taking? Authorizing Provider  acetaminophen (TYLENOL) 500 MG tablet Take 500 mg by mouth every 6 (six) hours as needed for moderate pain or headache.    [provider]  alum & mag hydroxide-simeth (MINTOX) 200-200-20 MG/5ML suspension Take 30 mLs by mouth every 6 (six) hours as needed for indigestion or heartburn.     [provider]  amLODipine (NORVASC) 2.5 MG tablet Take 2.5 mg by mouth daily.    [provider]  aspirin 325 MG tablet Take 1 tablet (325 mg total) by mouth daily. 06/22/15   Catarina Hartshornat, David, MD  atorvastatin (LIPITOR) 40 MG tablet Take 40 mg by mouth daily at 6 PM.     [provider]  carvedilol (COREG) 3.125 MG tablet Take 3.125 mg by mouth 2 (two) times daily with a meal.    [provider]  Cholecalciferol (VITAMIN D3) 5000 units TABS Take 5,000 Units by mouth daily.     [provider]  escitalopram (LEXAPRO) 5 MG tablet Take 10 mg by mouth daily.     [provider]  galantamine (RAZADYNE ER) 16 MG 24 hr capsule Take 16 mg by mouth daily with breakfast.    [provider]  insulin aspart (NOVOLOG FLEXPEN) 100 UNIT/ML FlexPen Inject 0-14 Units into the skin 2 (two) times daily. If sugar less than 120 units, no insulin.  121-150= 2 units  151-180= 3 units 181-210= 4 units 211-250= 5 units 251-280= 6 units 281-330= 8 units 331-400= 10 units If BS is greater than 400 inject 14 units    [provider]  LANTUS SOLOSTAR 100 UNIT/ML Solostar Pen Inject 17 Units into the skin daily. In am 07/06/16   [provider]  levETIRAcetam (KEPPRA) 500 MG tablet Take 500 mg by mouth 2 (  two) times daily.      [provider]  lisinopril (PRINIVIL,ZESTRIL) 20 MG tablet Take 1 tablet (20 mg total) by mouth daily. 06/22/15   Catarina Hartshorn, MD  loperamide (IMODIUM) 2 MG capsule Take 2 mg by mouth daily as needed for diarrhea or loose stools.    [provider]  magnesium hydroxide (MILK OF MAGNESIA) 400 MG/5ML suspension Take 30 mLs by mouth daily as needed for mild constipation.    [provider]  magnesium oxide (MAG-OX) 400 MG tablet Take 400 mg by mouth daily.    [provider]  Melatonin 3 MG TABS Take 3 mg by mouth at bedtime.    [provider]  metFORMIN (GLUCOPHAGE) 500 MG tablet  Take 500 mg by mouth 2 (two) times daily.     [provider]  mirtazapine (REMERON) 15 MG tablet Take 15 mg by mouth at bedtime.    [provider]  neomycin-bacitracin-polymyxin (NEOSPORIN) ointment Apply 1 application topically daily as needed for wound care. apply to eye    [provider]  pantoprazole (PROTONIX) 40 MG tablet Take 1 tablet (40 mg total) by mouth daily. 06/22/15   Catarina Hartshorn, MD  phenytoin (DILANTIN) 100 MG ER capsule Take 3 capsules (300 mg total) by mouth daily. Start 06/23/2015 Patient taking differently: Take 200 mg by mouth every morning.  06/23/15   Catarina Hartshorn, MD  potassium chloride SA (K-DUR,KLOR-CON) 20 MEQ tablet Take 20 mEq by mouth daily.     [provider]  risperiDONE (RISPERDAL) 1 MG tablet Take 1 mg by mouth at bedtime.    [provider]    ALLERGIES:  No Known Allergies  SOCIAL HISTORY:  Social History  Substance Use Topics  . Smoking status: Former Smoker    Types: Cigars    Quit date: 04/14/1986  . Smokeless tobacco: Never Used     Comment: Quit cigars in the 1980s  . Alcohol use No    FAMILY HISTORY: Family History  Problem Relation Age of Onset  . Heart disease Mother   . Alzheimer's disease Father   . Breast cancer Sister   . Colon cancer Unknown        unknown  . Diabetes Sister     EXAM: BP 136/85 (BP Location: Right Arm)   Pulse 66   Temp 98.5 F (36.9 C) (Oral)   Resp 12   Ht 5\' 11"  (1.803 m)   Wt 200 lb (90.7 kg)   SpO2 97%   BMI 27.89 kg/m  CONSTITUTIONAL: Alert and oriented To person only and responds appropriately to questions intermittently. Well-appearing; well-nourished; GCS 14; elderly and in no distress HEAD: Normocephalic; atraumatic EYES: Conjunctivae clear, PERRL, EOMI ENT: normal nose; no rhinorrhea; moist mucous membranes; pharynx without lesions noted; no dental injury; no septal hematoma NECK: Supple, no meningismus, no LAD; no midline spinal tenderness, step-off  or deformity; trachea midline CARD: RRR; S1 and S2 appreciated; no murmurs, no clicks, no rubs, no gallops RESP: Normal chest excursion without splinting or tachypnea; breath sounds clear and equal bilaterally; no wheezes, no rhonchi, no rales; no hypoxia or respiratory distress CHEST:  chest wall stable, no crepitus or ecchymosis or deformity, nontender to palpation; no flail chest ABD/GI: Normal bowel sounds; non-distended; soft, non-tender, no rebound, no guarding; no ecchymosis or other lesions noted PELVIS:  stable, nontender to palpation BACK:  The back appears normal and is under to palpation over the mid thoracic region, there is no CVA tenderness;  no midline spinal step-off or deformity EXT: Normal ROM in all joints; non-tender to palpation; no edema; normal capillary refill; no cyanosis, no bony tenderness or bony deformity of patient's extremities, no joint effusion, compartments are soft, extremities are warm and well-perfused, no ecchymosis, 2+ DP pulses bilaterally, no tenderness to palpation over the left fifth toe and he has full range of motion without signs of tendon injury SKIN: Normal color for age and race; warm, patient has a 2 cm laceration underneath the left fifth toe but has full range of motion in this toe and this laceration appears to be superficial without foreign body NEURO: Moves all extremities equally, normal speech, cranial nerves II through XII intact, reports normal sensation diffusely PSYCH: The patient's mood and manner are appropriate. Grooming and personal hygiene are appropriate.  MEDICAL DECISION MAKING: Patient here with unwitnessed fall. Patient has dementia and appears to be at his baseline. Has no current complaints. On exam he does have some thoracic spine tenderness without obvious acute traumatic injury, deformity. We'll obtain CT imaging of his cervical spine, head, x-ray of thoracic spine and left foot. Will repair his laceration. We'll update his  tetanus vaccination.  ED PROGRESS: Patient's imaging shows no acute abnormality.  The left foot x-ray shows a linear transverse lucency at the base of the left proximal fifth phalanx that is favored to be chronic. He has no point tenderness over the bone of this area. Again his laceration is very superficial and I do not think that he has an open fracture. There is no sign of any tendon damage. He is neurovascularly intact distally. Laceration has been repaired by Sharen Heck, PA.  I feel he is safe to be discharged back to his nursing facility.   At this time, I do not feel there is any life-threatening condition present. I have reviewed and discussed all results (EKG, imaging, lab, urine as appropriate) and exam findings with patient/family. I have reviewed nursing notes and appropriate previous records.  I feel the patient is safe to be discharged home without further emergent workup and can continue workup as an outpatient as needed. Discussed usual and customary return precautions. Patient/family verbalize understanding and are comfortable with this plan.  Outpatient follow-up has been provided if needed. All questions have been answered.   I personally performed the services described in this documentation, which was scribed in my presence. The recorded information has been reviewed and is accurate.     Simon Aaberg, Layla Maw, DO 05/04/17 [phone]

## 2017-05-04 NOTE — ED Notes (Signed)
[facility], pt had unwitnessed fall. C/O R sided neck pain, has laceration to L toe. Bleeding controlled and dressing placed by EMS. No blood thinners. Pt has hx dementia, VSS.

## 2017-05-04 NOTE — Discharge Instructions (Signed)
The CT of your head and cervical spine x-rays of your back today were normal. The x-ray of your left foot show that you could have a fracture of the fifth toe but it is likely an old injury. Have repair the laceration and are putting you in a postoperative shoe which we recommend you wear for the next 4 weeks. Sutures can be removed in 7-10 days.

## 2017-05-04 NOTE — ED Notes (Signed)
[facility] called  

## 2017-05-04 NOTE — ED Provider Notes (Signed)
LACERATION REPAIR Performed by: Liberty Handylaudia J Deniece Rankin Authorized by: Liberty Handylaudia J Lennart Gladish Consent: Verbal consent obtained. Risks and benefits: risks, benefits and alternatives were discussed Consent given by: patient Patient identity confirmed: provided demographic data Prepped and Draped in normal sterile fashion Wound explored  Laceration Location: plantar aspect of 5th toe (left)  Laceration Length: 2 cm  No Foreign Bodies seen or palpated  Anesthesia: local infiltration  Local anesthetic: lidocaine 1 % w/o   Anesthetic total: 3 ml  Irrigation method: syringe, 500 cc normal saline; iodine Amount of cleaning: standard  Skin closure: primary  Number of sutures: 3  Technique: simple interrupted; sterile   Patient tolerance: Patient tolerated the procedure well with no immediate complications.    Liberty HandyGibbons, Zelig Gacek J, PA-C 05/04/17 [phone]

## 2017-05-11 DIAGNOSIS — S91115A Laceration without foreign body of left lesser toe(s) without damage to nail, initial encounter: Secondary | ICD-10-CM | POA: Diagnosis not present

## 2017-05-11 DIAGNOSIS — Z6827 Body mass index (BMI) 27.0-27.9, adult: Secondary | ICD-10-CM | POA: Diagnosis not present

## 2017-05-11 DIAGNOSIS — Z4802 Encounter for removal of sutures: Secondary | ICD-10-CM | POA: Diagnosis not present

## 2017-05-13 ENCOUNTER — Encounter (HOSPITAL_COMMUNITY): Payer: Self-pay | Admitting: Emergency Medicine

## 2017-05-13 ENCOUNTER — Emergency Department (HOSPITAL_COMMUNITY)
Admission: EM | Admit: 2017-05-13 | Discharge: 2017-05-14 | Disposition: A | Discharge status: Home / Self Care | Payer: Medicare Other | Attending: Emergency Medicine | Admitting: Emergency Medicine

## 2017-05-13 DIAGNOSIS — Z79899 Other long term (current) drug therapy: Secondary | ICD-10-CM | POA: Diagnosis not present

## 2017-05-13 DIAGNOSIS — H1032 Unspecified acute conjunctivitis, left eye: Secondary | ICD-10-CM

## 2017-05-13 DIAGNOSIS — Z87891 Personal history of nicotine dependence: Secondary | ICD-10-CM | POA: Insufficient documentation

## 2017-05-13 DIAGNOSIS — Z794 Long term (current) use of insulin: Secondary | ICD-10-CM | POA: Insufficient documentation

## 2017-05-13 DIAGNOSIS — M7989 Other specified soft tissue disorders: Secondary | ICD-10-CM | POA: Insufficient documentation

## 2017-05-13 DIAGNOSIS — I129 Hypertensive chronic kidney disease with stage 1 through stage 4 chronic kidney disease, or unspecified chronic kidney disease: Secondary | ICD-10-CM | POA: Insufficient documentation

## 2017-05-13 DIAGNOSIS — I2510 Atherosclerotic heart disease of native coronary artery without angina pectoris: Secondary | ICD-10-CM | POA: Insufficient documentation

## 2017-05-13 DIAGNOSIS — E1122 Type 2 diabetes mellitus with diabetic chronic kidney disease: Secondary | ICD-10-CM | POA: Insufficient documentation

## 2017-05-13 DIAGNOSIS — Z7982 Long term (current) use of aspirin: Secondary | ICD-10-CM | POA: Insufficient documentation

## 2017-05-13 DIAGNOSIS — F4329 Adjustment disorder with other symptoms: Secondary | ICD-10-CM | POA: Diagnosis not present

## 2017-05-13 DIAGNOSIS — R2242 Localized swelling, mass and lump, left lower limb: Secondary | ICD-10-CM | POA: Diagnosis present

## 2017-05-13 DIAGNOSIS — N189 Chronic kidney disease, unspecified: Secondary | ICD-10-CM | POA: Diagnosis not present

## 2017-05-13 DIAGNOSIS — F29 Unspecified psychosis not due to a substance or known physiological condition: Secondary | ICD-10-CM | POA: Diagnosis not present

## 2017-05-13 DIAGNOSIS — F23 Brief psychotic disorder: Secondary | ICD-10-CM | POA: Diagnosis not present

## 2017-05-13 HISTORY — DX: Adjustment disorder with other symptoms: F43.29

## 2017-05-13 HISTORY — DX: Benign prostatic hyperplasia without lower urinary tract symptoms: N40.0

## 2017-05-13 HISTORY — DX: Obstructive sleep apnea (adult) (pediatric): G47.33

## 2017-05-13 LAB — CBC WITH DIFFERENTIAL/PLATELET
BASOS ABS: 0.1 10*3/uL (ref 0.0–0.1)
Basophils Relative: 1 %
Eosinophils Absolute: 0.9 10*3/uL — ABNORMAL HIGH (ref 0.0–0.7)
Eosinophils Relative: 9 %
HEMATOCRIT: 36 % — AB (ref 39.0–52.0)
HEMOGLOBIN: 12.3 g/dL — AB (ref 13.0–17.0)
LYMPHS PCT: 28 %
Lymphs Abs: 2.6 10*3/uL (ref 0.7–4.0)
MCH: 31.2 pg (ref 26.0–34.0)
MCHC: 34.2 g/dL (ref 30.0–36.0)
MCV: 91.4 fL (ref 78.0–100.0)
Monocytes Absolute: 0.8 10*3/uL (ref 0.1–1.0)
Monocytes Relative: 8 %
NEUTROS ABS: 5 10*3/uL (ref 1.7–7.7)
NEUTROS PCT: 54 %
PLATELETS: 177 10*3/uL (ref 150–400)
RBC: 3.94 MIL/uL — AB (ref 4.22–5.81)
RDW: 13.2 % (ref 11.5–15.5)
WBC: 9.3 10*3/uL (ref 4.0–10.5)

## 2017-05-13 LAB — RAPID URINE DRUG SCREEN, HOSP PERFORMED
Amphetamines: NOT DETECTED
BARBITURATES: NOT DETECTED
Benzodiazepines: NOT DETECTED
Cocaine: NOT DETECTED
Opiates: NOT DETECTED
Tetrahydrocannabinol: NOT DETECTED

## 2017-05-13 LAB — COMPREHENSIVE METABOLIC PANEL
ALBUMIN: 3.5 g/dL (ref 3.5–5.0)
ALK PHOS: 120 U/L (ref 38–126)
ALT: 27 U/L (ref 17–63)
ANION GAP: 10 (ref 5–15)
AST: 28 U/L (ref 15–41)
BILIRUBIN TOTAL: 0.5 mg/dL (ref 0.3–1.2)
BUN: 21 mg/dL — AB (ref 6–20)
CALCIUM: 9.3 mg/dL (ref 8.9–10.3)
CO2: 30 mmol/L (ref 22–32)
Chloride: 103 mmol/L (ref 101–111)
Creatinine, Ser: 0.84 mg/dL (ref 0.61–1.24)
GFR calc Af Amer: >60 mL/min (ref >60)
GFR calc non Af Amer: >60 mL/min (ref >60)
GLUCOSE: 150 mg/dL — AB (ref 65–99)
Potassium: 3.3 mmol/L — ABNORMAL LOW (ref 3.5–5.1)
Sodium: 143 mmol/L (ref 135–145)
TOTAL PROTEIN: 6.9 g/dL (ref 6.5–8.1)

## 2017-05-13 LAB — URINALYSIS, ROUTINE W REFLEX MICROSCOPIC
BACTERIA UA: NONE SEEN
Bilirubin Urine: NEGATIVE
GLUCOSE, UA: NEGATIVE mg/dL
Ketones, ur: NEGATIVE mg/dL
LEUKOCYTES UA: NEGATIVE
NITRITE: NEGATIVE
PROTEIN: NEGATIVE mg/dL
Specific Gravity, Urine: 1.014 (ref 1.005–1.030)
Squamous Epithelial / LPF: NONE SEEN
pH: 8 (ref 5.0–8.0)

## 2017-05-13 LAB — ETHANOL: Alcohol, Ethyl (B): <5 mg/dL (ref <5)

## 2017-05-13 LAB — CBG MONITORING, ED: Glucose-Capillary: 139 mg/dL — ABNORMAL HIGH (ref 65–99)

## 2017-05-13 MED ORDER — ASPIRIN 325 MG PO TABS
325.0000 mg | ORAL_TABLET | Freq: Every day | ORAL | Status: DC
  Administered 2017-05-14: 325 mg via ORAL
  Filled 2017-05-13: qty 1

## 2017-05-13 MED ORDER — LEVETIRACETAM 500 MG PO TABS
500.0000 mg | ORAL_TABLET | Freq: Two times a day (BID) | ORAL | Status: DC
  Administered 2017-05-14 (×2): 500 mg via ORAL
  Filled 2017-05-13 (×2): qty 1

## 2017-05-13 MED ORDER — METFORMIN HCL 500 MG PO TABS
500.0000 mg | ORAL_TABLET | Freq: Two times a day (BID) | ORAL | Status: DC
  Administered 2017-05-14 (×2): 500 mg via ORAL
  Filled 2017-05-13 (×2): qty 1

## 2017-05-13 MED ORDER — CARVEDILOL 3.125 MG PO TABS
3.1250 mg | ORAL_TABLET | Freq: Two times a day (BID) | ORAL | Status: DC
  Administered 2017-05-14: 3.125 mg via ORAL
  Filled 2017-05-13 (×2): qty 1

## 2017-05-13 MED ORDER — RISPERIDONE 1 MG PO TABS
1.0000 mg | ORAL_TABLET | Freq: Every day | ORAL | Status: DC
  Administered 2017-05-14: 1 mg via ORAL
  Filled 2017-05-13: qty 1

## 2017-05-13 MED ORDER — ENOXAPARIN SODIUM 100 MG/ML ~~LOC~~ SOLN
1.0000 mg/kg | Freq: Once | SUBCUTANEOUS | Status: AC
  Administered 2017-05-13: 90 mg via SUBCUTANEOUS
  Filled 2017-05-13: qty 0.9

## 2017-05-13 MED ORDER — ATORVASTATIN CALCIUM 40 MG PO TABS
40.0000 mg | ORAL_TABLET | Freq: Every day | ORAL | Status: DC
  Filled 2017-05-13: qty 1

## 2017-05-13 MED ORDER — AMLODIPINE BESYLATE 5 MG PO TABS
2.5000 mg | ORAL_TABLET | Freq: Every day | ORAL | Status: DC
  Administered 2017-05-14: 2.5 mg via ORAL
  Filled 2017-05-13: qty 1

## 2017-05-13 MED ORDER — LISINOPRIL 20 MG PO TABS
20.0000 mg | ORAL_TABLET | Freq: Every day | ORAL | Status: DC
  Administered 2017-05-14: 20 mg via ORAL
  Filled 2017-05-13: qty 1

## 2017-05-13 MED ORDER — DIPHENHYDRAMINE HCL 25 MG PO CAPS
25.0000 mg | ORAL_CAPSULE | Freq: Once | ORAL | Status: AC
  Administered 2017-05-13: 25 mg via ORAL
  Filled 2017-05-13: qty 1

## 2017-05-13 MED ORDER — PHENYTOIN SODIUM EXTENDED 100 MG PO CAPS
200.0000 mg | ORAL_CAPSULE | Freq: Every morning | ORAL | Status: DC
  Administered 2017-05-14: 200 mg via ORAL
  Filled 2017-05-13: qty 2

## 2017-05-13 MED ORDER — MIRTAZAPINE 30 MG PO TABS
15.0000 mg | ORAL_TABLET | Freq: Every day | ORAL | Status: DC
  Administered 2017-05-14: 15 mg via ORAL
  Filled 2017-05-13: qty 1

## 2017-05-13 MED ORDER — ESCITALOPRAM OXALATE 10 MG PO TABS
10.0000 mg | ORAL_TABLET | Freq: Every day | ORAL | Status: DC
  Administered 2017-05-14: 10 mg via ORAL
  Filled 2017-05-13: qty 1

## 2017-05-13 MED ORDER — TOBRAMYCIN 0.3 % OP SOLN
1.0000 [drp] | Freq: Four times a day (QID) | OPHTHALMIC | Status: DC
  Administered 2017-05-13 – 2017-05-14 (×4): 1 [drp] via OPHTHALMIC
  Filled 2017-05-13: qty 5

## 2017-05-13 MED ORDER — INSULIN GLARGINE 100 UNIT/ML ~~LOC~~ SOLN
17.0000 [IU] | Freq: Every day | SUBCUTANEOUS | Status: DC
  Filled 2017-05-13: qty 0.17

## 2017-05-13 MED ORDER — INSULIN ASPART 100 UNIT/ML FLEXPEN
0.0000 [IU] | PEN_INJECTOR | Freq: Two times a day (BID) | SUBCUTANEOUS | Status: DC
  Filled 2017-05-13: qty 3

## 2017-05-13 MED ORDER — POTASSIUM CHLORIDE CRYS ER 20 MEQ PO TBCR
20.0000 meq | EXTENDED_RELEASE_TABLET | Freq: Every day | ORAL | Status: DC
  Administered 2017-05-14: 20 meq via ORAL
  Filled 2017-05-13: qty 1

## 2017-05-13 MED ORDER — GALANTAMINE HYDROBROMIDE 4 MG PO TABS
8.0000 mg | ORAL_TABLET | Freq: Two times a day (BID) | ORAL | Status: DC
  Administered 2017-05-14: 8 mg via ORAL
  Filled 2017-05-13 (×2): qty 2

## 2017-05-13 MED ORDER — GALANTAMINE HYDROBROMIDE ER 8 MG PO CP24
16.0000 mg | ORAL_CAPSULE | Freq: Every day | ORAL | Status: DC
  Filled 2017-05-13: qty 2

## 2017-05-13 MED ORDER — PANTOPRAZOLE SODIUM 40 MG PO TBEC
40.0000 mg | DELAYED_RELEASE_TABLET | Freq: Every day | ORAL | Status: DC
Start: 2017-05-14 — End: 2017-05-14
  Administered 2017-05-14: 40 mg via ORAL
  Filled 2017-05-13: qty 1
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
## 2017-05-13 NOTE — ED Notes (Signed)
TTS consult in process at bedside at this time. 

## 2017-05-13 NOTE — ED Notes (Signed)
Bed: [id number] Expected date:  Expected time:  Means of arrival:  Comments: 71 yo aggressive behavior

## 2017-05-13 NOTE — ED Provider Notes (Signed)
Pt seen and evaluated. D/W  A. Harris PA-C. No apparent acute medical/organic explanation for excalation of aggressive behavior. May need Medication recommendations on.y. Agree with am doppler of LE, and BH/Psy recommendations.   Arthur PorterJames, Jachelle Fluty, MD 05/13/17 [phone]
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
## 2017-05-13 NOTE — ED Triage Notes (Signed)
Per EMS. [facility]. Staff report pt has been having occasional lashing out at staff and other residents. Example given to EMS was that other people would be talking and he would yell "shut up" at them for no apparent reason. This has been occurring for the past 3 weeks. Pt calm, cooperative, and ambulatory with EMS. Oriented to self only upon arrival to ED.

## 2017-05-14 ENCOUNTER — Encounter (HOSPITAL_COMMUNITY): Payer: Self-pay | Admitting: Psychiatry

## 2017-05-14 ENCOUNTER — Emergency Department (HOSPITAL_BASED_OUTPATIENT_CLINIC_OR_DEPARTMENT_OTHER): Admit: 2017-05-14 | Discharge: 2017-05-14 | Disposition: A | Discharge status: Home / Self Care | Payer: Medicare Other

## 2017-05-14 DIAGNOSIS — M7989 Other specified soft tissue disorders: Secondary | ICD-10-CM

## 2017-05-14 DIAGNOSIS — R4182 Altered mental status, unspecified: Secondary | ICD-10-CM | POA: Diagnosis not present

## 2017-05-14 DIAGNOSIS — R2689 Other abnormalities of gait and mobility: Secondary | ICD-10-CM | POA: Diagnosis not present

## 2017-05-14 DIAGNOSIS — F4329 Adjustment disorder with other symptoms: Secondary | ICD-10-CM | POA: Diagnosis not present

## 2017-05-14 HISTORY — DX: Adjustment disorder with other symptoms: F43.29

## 2017-05-14 LAB — CBG MONITORING, ED
GLUCOSE-CAPILLARY: 100 mg/dL — AB (ref 65–99)
GLUCOSE-CAPILLARY: 125 mg/dL — AB (ref 65–99)
GLUCOSE-CAPILLARY: 101 mg/dL — AB (ref 65–99)

## 2017-05-14 MED ORDER — METFORMIN HCL 500 MG PO TABS: 500.0000 mg | ORAL_TABLET | Freq: Two times a day (BID) | ORAL | Status: DC

## 2017-05-14 MED ORDER — INSULIN ASPART 100 UNIT/ML ~~LOC~~ SOLN
0.0000 [IU] | Freq: Two times a day (BID) | SUBCUTANEOUS | Status: DC
Start: 2017-05-14 — End: 2017-05-14

## 2017-05-14 MED ORDER — INSULIN GLARGINE 100 UNIT/ML ~~LOC~~ SOLN
17.0000 [IU] | Freq: Every day | SUBCUTANEOUS | Status: DC
  Filled 2017-05-14: qty 0.17

## 2017-05-14 NOTE — ED Notes (Signed)
Patient given breakfast tray.

## 2017-05-14 NOTE — [facility] Suicide Risk Assessment (Signed)
Suicide Risk Assessment  Discharge Assessment   [facility]   Principal Problem: Adjustment reaction with aggression Discharge Diagnoses:  Patient Active Problem List   Diagnosis Date Noted  . Adjustment reaction with aggression [F43.29] 05/14/2017    Priority: High  . Type 2 diabetes mellitus with other circulatory complications (HCC) [E11.59] 06/19/2015  . Thrombocytopenia (HCC) [D69.6] 06/19/2015  . Cerebral infarction due to unspecified mechanism [I63.9]   . Dilantin toxicity [T42.0X1A] 06/18/2015  . Phenytoin toxicity [T42.0X1A]   . Stroke (HCC) [I63.9] 06/16/2015  . Fall [W19.XXXA] 06/16/2015  . Epilepsy (HCC) [G40.909] 06/16/2015  . BPH (benign prostatic hyperplasia) [N40.0] 06/16/2015  . Leukocytosis [D72.829] 06/16/2015  . CVA (cerebral infarction) [I63.9] 06/16/2015  . CVA (cerebral vascular accident) (HCC) [I63.9] 06/15/2015  . GERD (gastroesophageal reflux disease) [K21.9] 04/23/2014  . Potassium (K) deficiency [E87.6] 12/11/2012  . Precordial pain [R07.2] 12/11/2012  . Dehydration [E86.0] 04/15/2012  . AKI (acute kidney injury) (HCC) [N17.9] 04/14/2012  . HLD (hyperlipidemia) [E78.5] 06/01/2010  . Essential hypertension, benign [I10] 06/01/2010  . DYSPNEA [R06.02] 06/01/2010  . Diabetes mellitus without complication (HCC) [E11.9] 11/04/2009  . RECTAL BLEEDING [K62.5] 11/04/2009  . PERSONAL HX COLONIC POLYPS [Z86.010] 11/04/2009    Total Time spent with patient: 45 minutes  Musculoskeletal: Strength & Muscle Tone: within normal limits Gait & Station: normal Patient leans: N/A  Psychiatric Specialty Exam:   Blood pressure (!) 166/89, pulse 71, temperature 98.5 F (36.9 C), temperature source Oral, resp. rate 16, SpO2 100 %.There is no height or weight on file to calculate BMI.  General Appearance: Casual  Eye Contact::  Good  Speech:  Normal Rate409  Volume:  Normal  Mood:  Euthymic  Affect:  Congruent  Thought Process:   Coherent and Descriptions of Associations: Intact  Orientation:  Full (Time, Place, and Person)  Thought Content:  WDL and Logical  Suicidal Thoughts:  No  Homicidal Thoughts:  No  Memory:  Immediate;   Fair Recent;   Fair Remote;   Fair  Judgement:  Fair  Insight:  Fair  Psychomotor Activity:  Normal  Concentration:  Good  Recall:  Fair  Fund of Knowledge:Good  Language: Good  Akathisia:  No  Handed:  Right  AIMS (if indicated):     Assets:  Housing Leisure Time Resilience Social Support  Sleep:     Cognition: WNL  ADL's:  Impaired, needs assistance   Mental Status Per Nursing Assessment::   On Admission:   Threatening others at his facility. Today, he is calm and cooperative with no suicidal/homicidal ideations, hallucinations, or alcohol/drug abuse.  Agreeable to return to his assisted living.  Stable to return.  Demographic Factors:  Male, Age 71 or older and Caucasian  Loss Factors: NA  Historical Factors: NA  Risk Reduction Factors:   Sense of responsibility to family, Living with another person, especially a relative, Positive social support and Positive therapeutic relationship  Continued Clinical Symptoms:  NOne  Cognitive Features That Contribute To Risk:  None    Suicide Risk:  Minimal: No identifiable suicidal ideation.  Patients presenting with no risk factors but with morbid ruminations; may be classified as minimal risk based on the severity of the depressive symptoms    Plan Of Care/Follow-up recommendations:  Activity:  as tolerated Diet:  heart healhty diet  Soo Steelman, NP 05/14/2017, 10:50 AM

## 2017-05-14 NOTE — ED Notes (Signed)
Per TTS:  Pt met criteria for in-patient geri-psych treatment.

## 2017-05-14 NOTE — Progress Notes (Signed)
**  Preliminary report by tech**  Left lower extremity venous duplex complete. There is no evidence of deep or superficial vein thrombosis involving the left lower extremity. All visualized vessels appear patent and compressible. There is no evidence of a Baker's cyst on the left. Results were given to the patient's nurse, Toni Amendourtney.  05/14/17 8:57 AM Olen CordialGreg Isiah Scheel RVT

## 2017-05-14 NOTE — ED Notes (Signed)
Previous departure condition charted on the wrong patient.

## 2017-05-14 NOTE — BH Assessment (Signed)
[facility]HH Assessment Progress Note  Per Thedore MinsMojeed Akintayo, MD, this pt does not require psychiatric hospitalization at this time.  [facility]  Stacy GardnerErin Davenport, LCSW agrees to facilitate this.  No further behavioral health resources are necessary.  Pt's nurse has been notified.  Doylene Canninghomas Harold Mattes, MA Triage Specialist [phone]

## 2017-05-14 NOTE — Progress Notes (Signed)
CSW met with pt at bedside to notify pt that pt would be returning to [facility] once discharged. Pt was agreeable to this and asked that CSW call pt's brother Lanae Boast) to notify him that he would be retuning. PTAR was called for pt and pt is currently waiting PTAR to arrive. No further needs from CSW.     Virgie Dad Caylor Tallarico, MSW, [place] [facility] Clinical Social Worker [phone]

## 2017-05-14 NOTE — Progress Notes (Addendum)
CSW spoke with representative from [facility] on 05/14/17. CSW informed representative [facility]. [facility] agree[facility] to pt returning to the facility. Pt will be transported back to the facility by non emergency PTAR.   No further concerns were presented to CSW at this time.   Claude MangesKierra S. Leandria Thier, MSW, LCSW-A Emergency Department Clinical Social Worker [phone]

## 2017-05-30 DIAGNOSIS — Z125 Encounter for screening for malignant neoplasm of prostate: Secondary | ICD-10-CM | POA: Diagnosis not present

## 2017-05-30 DIAGNOSIS — E559 Vitamin D deficiency, unspecified: Secondary | ICD-10-CM | POA: Diagnosis not present

## 2017-05-30 DIAGNOSIS — R946 Abnormal results of thyroid function studies: Secondary | ICD-10-CM | POA: Diagnosis not present

## 2017-05-30 DIAGNOSIS — E119 Type 2 diabetes mellitus without complications: Secondary | ICD-10-CM | POA: Diagnosis not present

## 2017-05-30 DIAGNOSIS — I10 Essential (primary) hypertension: Secondary | ICD-10-CM | POA: Diagnosis not present

## 2017-05-30 DIAGNOSIS — E784 Other hyperlipidemia: Secondary | ICD-10-CM | POA: Diagnosis not present

## 2017-06-06 DIAGNOSIS — Z0000 Encounter for general adult medical examination without abnormal findings: Secondary | ICD-10-CM | POA: Diagnosis not present

## 2017-06-06 DIAGNOSIS — R972 Elevated prostate specific antigen [PSA]: Secondary | ICD-10-CM | POA: Diagnosis not present

## 2017-06-06 DIAGNOSIS — R21 Rash and other nonspecific skin eruption: Secondary | ICD-10-CM | POA: Diagnosis not present

## 2017-06-06 DIAGNOSIS — F3289 Other specified depressive episodes: Secondary | ICD-10-CM | POA: Diagnosis not present

## 2017-06-06 DIAGNOSIS — Z6826 Body mass index (BMI) 26.0-26.9, adult: Secondary | ICD-10-CM | POA: Diagnosis not present

## 2017-06-06 DIAGNOSIS — G4709 Other insomnia: Secondary | ICD-10-CM | POA: Diagnosis not present

## 2017-06-06 DIAGNOSIS — N401 Enlarged prostate with lower urinary tract symptoms: Secondary | ICD-10-CM | POA: Diagnosis not present

## 2017-06-06 DIAGNOSIS — I638 Other cerebral infarction: Secondary | ICD-10-CM | POA: Diagnosis not present

## 2017-06-06 DIAGNOSIS — R569 Unspecified convulsions: Secondary | ICD-10-CM | POA: Diagnosis not present

## 2017-06-06 DIAGNOSIS — R808 Other proteinuria: Secondary | ICD-10-CM | POA: Diagnosis not present

## 2017-06-06 DIAGNOSIS — E559 Vitamin D deficiency, unspecified: Secondary | ICD-10-CM | POA: Diagnosis not present

## 2017-06-06 DIAGNOSIS — R413 Other amnesia: Secondary | ICD-10-CM | POA: Diagnosis not present

## 2017-06-28 DIAGNOSIS — B351 Tinea unguium: Secondary | ICD-10-CM | POA: Diagnosis not present

## 2017-06-28 DIAGNOSIS — M79675 Pain in left toe(s): Secondary | ICD-10-CM | POA: Diagnosis not present

## 2017-06-28 DIAGNOSIS — M79674 Pain in right toe(s): Secondary | ICD-10-CM | POA: Diagnosis not present

## 2017-07-18 DIAGNOSIS — R21 Rash and other nonspecific skin eruption: Secondary | ICD-10-CM | POA: Diagnosis not present

## 2017-07-18 DIAGNOSIS — G4700 Insomnia, unspecified: Secondary | ICD-10-CM | POA: Diagnosis not present

## 2017-07-18 DIAGNOSIS — Z6826 Body mass index (BMI) 26.0-26.9, adult: Secondary | ICD-10-CM | POA: Diagnosis not present

## 2017-07-26 ENCOUNTER — Emergency Department (HOSPITAL_COMMUNITY): Payer: Medicare Other

## 2017-07-26 ENCOUNTER — Emergency Department (HOSPITAL_COMMUNITY)
Admission: EM | Admit: 2017-07-26 | Discharge: 2017-07-26 | Disposition: A | Discharge status: Home / Self Care | Payer: Medicare Other | Attending: Emergency Medicine | Admitting: Emergency Medicine

## 2017-07-26 ENCOUNTER — Encounter (HOSPITAL_COMMUNITY): Payer: Self-pay | Admitting: Emergency Medicine

## 2017-07-26 DIAGNOSIS — Y999 Unspecified external cause status: Secondary | ICD-10-CM | POA: Insufficient documentation

## 2017-07-26 DIAGNOSIS — R0989 Other specified symptoms and signs involving the circulatory and respiratory systems: Secondary | ICD-10-CM | POA: Diagnosis not present

## 2017-07-26 DIAGNOSIS — Y9389 Activity, other specified: Secondary | ICD-10-CM | POA: Insufficient documentation

## 2017-07-26 DIAGNOSIS — R05 Cough: Secondary | ICD-10-CM | POA: Diagnosis not present

## 2017-07-26 DIAGNOSIS — X58XXXA Exposure to other specified factors, initial encounter: Secondary | ICD-10-CM | POA: Diagnosis not present

## 2017-07-26 DIAGNOSIS — Z87891 Personal history of nicotine dependence: Secondary | ICD-10-CM | POA: Insufficient documentation

## 2017-07-26 DIAGNOSIS — Z79899 Other long term (current) drug therapy: Secondary | ICD-10-CM | POA: Insufficient documentation

## 2017-07-26 DIAGNOSIS — T17328A Food in larynx causing other injury, initial encounter: Secondary | ICD-10-CM | POA: Insufficient documentation

## 2017-07-26 DIAGNOSIS — Y929 Unspecified place or not applicable: Secondary | ICD-10-CM | POA: Insufficient documentation

## 2017-07-26 DIAGNOSIS — Z794 Long term (current) use of insulin: Secondary | ICD-10-CM | POA: Insufficient documentation

## 2017-07-26 DIAGNOSIS — I2510 Atherosclerotic heart disease of native coronary artery without angina pectoris: Secondary | ICD-10-CM | POA: Insufficient documentation

## 2017-07-26 DIAGNOSIS — Z7982 Long term (current) use of aspirin: Secondary | ICD-10-CM | POA: Diagnosis not present

## 2017-07-26 DIAGNOSIS — I10 Essential (primary) hypertension: Secondary | ICD-10-CM | POA: Diagnosis not present

## 2017-07-26 DIAGNOSIS — T17320A Food in larynx causing asphyxiation, initial encounter: Secondary | ICD-10-CM | POA: Diagnosis not present

## 2017-07-26 DIAGNOSIS — E119 Type 2 diabetes mellitus without complications: Secondary | ICD-10-CM | POA: Diagnosis not present

## 2017-07-26 DIAGNOSIS — T17308A Unspecified foreign body in larynx causing other injury, initial encounter: Secondary | ICD-10-CM

## 2017-07-26 NOTE — ED Notes (Signed)
[facility] Expected date:  Expected time:  Means of arrival:  Comments: EMS eval after [place]

## 2017-07-26 NOTE — ED Provider Notes (Signed)
[facility]-EMERGENCY DEPT Provider Note   CSN: [id number] Arrival date & time: 07/26/17  0901     History   Chief Complaint Chief Complaint  Patient presents with  . Choking    earlier today    HPI Arthur Berry is a 71 y.o. male.  71 yo M with a chief complaint of an aspiration event. The patient stated that he tried to swallow too much eggs at once. Had a coughing fit received the Heimlich maneuver. Denies coughing anything up. Denies continued coughing or shortness of breath. Is unsure if he swallowed it. Has no complaints currently. Denies difficulty swallowing at baseline denies unilateral numbness or weakness.   The history is provided by the patient.  Illness  This is a new problem. The current episode started 2 days ago. The problem occurs constantly. The problem has not changed since onset.Pertinent negatives include no chest pain, no abdominal pain, no headaches and no shortness of breath. Nothing aggravates the symptoms. Nothing relieves the symptoms. He has tried nothing for the symptoms. The treatment provided no relief.    Past Medical History:  Diagnosis Date  . Adjustment reaction with aggression 05/14/2017  . Arthritis   . Chronic kidney disease   . Colon polyp 2005   Tubular Adenoma  . Coronary artery disease, non-occlusive    Minimal nonobstructive CAD, nl LV systolic fxn by cath 12/11/12  . Diabetes mellitus 2001   TYPE 2  . Diverticulosis   . Epilepsy (HCC)   . GERD (gastroesophageal reflux disease) 04/23/2014  . Goiter, nontoxic, multinodular    Thyroid US 11/2012  . Hyperlipidemia 1999  . Hypertension 1999  . Internal hemorrhoids   . Neuromuscular disorder (HCC)   . OSA (obstructive sleep apnea)   . Prostate enlargement   . Sleep apnea   . Splinter 04/27/13   metal plinter removed rt pointer finger  . Stroke (HCC) 06/2015  . Vision changes 06/2015   since CVA    Patient Active Problem List   Diagnosis Date Noted  . Adjustment reaction with  aggression 05/14/2017  . Type 2 diabetes mellitus with other circulatory complications (HCC) 06/19/2015  . Thrombocytopenia (HCC) 06/19/2015  . Cerebral infarction due to unspecified mechanism   . Dilantin toxicity 06/18/2015  . Phenytoin toxicity   . Stroke (HCC) 06/16/2015  . Fall 06/16/2015  . Epilepsy (HCC) 06/16/2015  . BPH (benign prostatic hyperplasia) 06/16/2015  . Leukocytosis 06/16/2015  . CVA (cerebral infarction) 06/16/2015  . CVA (cerebral vascular accident) (HCC) 06/15/2015  . GERD (gastroesophageal reflux disease) 04/23/2014  . Potassium (K) deficiency 12/11/2012  . Precordial pain 12/11/2012  . Dehydration 04/15/2012  . AKI (acute kidney injury) (HCC) 04/14/2012  . HLD (hyperlipidemia) 06/01/2010  . Essential hypertension, benign 06/01/2010  . DYSPNEA 06/01/2010  . Diabetes mellitus without complication (HCC) 11/04/2009  . RECTAL BLEEDING 11/04/2009  . PERSONAL HX COLONIC POLYPS 11/04/2009    Past Surgical History:  Procedure Laterality Date  . CHOLECYSTECTOMY    . COLONOSCOPY  12/28/2009  . ESOPHAGOGASTRODUODENOSCOPY  10/14/2012   normal   . INGUINAL HERNIA REPAIR  06/27/11   left  . KNEE SURGERY  2008   Left  . LEFT HEART CATHETERIZATION WITH CORONARY ANGIOGRAM N/A 12/11/2012   Procedure: LEFT HEART CATHETERIZATION WITH CORONARY ANGIOGRAM;  Surgeon: Tonny BollmanMichael Cooper, MD;  [facility];  Service: Cardiovascular;  Laterality: N/A;       Home Medications    Prior to Admission medications   Medication Sig Start Date  End Date Taking? Authorizing Provider  acetaminophen (TYLENOL) 500 MG tablet Take 500 mg by mouth every 6 (six) hours as needed for moderate pain or headache.    [provider]  alum & mag hydroxide-simeth (MINTOX) 200-200-20 MG/5ML suspension Take 30 mLs by mouth every 6 (six) hours as needed for indigestion or heartburn.    [provider]  amLODipine (NORVASC) 2.5 MG tablet Take 2.5 mg by mouth daily.    [provider]  aspirin 325 MG tablet Take 1 tablet (325 mg total) by mouth daily. 06/22/15   Catarina Hartshorn, MD  atorvastatin (LIPITOR) 40 MG tablet Take 40 mg by mouth daily at 6 PM.     [provider]  carvedilol (COREG) 3.125 MG tablet Take 3.125 mg by mouth 2 (two) times daily with a meal.    [provider]  Cholecalciferol (VITAMIN D3) 5000 units TABS Take 5,000 Units by mouth daily.     [provider]  escitalopram (LEXAPRO) 5 MG tablet Take 10 mg by mouth daily.     [provider]  galantamine (RAZADYNE ER) 16 MG 24 hr capsule Take 16 mg by mouth daily with breakfast.    [provider]  insulin aspart (NOVOLOG FLEXPEN) 100 UNIT/ML FlexPen Inject 0-14 Units into the skin 2 (two) times daily. If sugar less than 120 units, no insulin.  121-150= 2 units  151-180= 3 units 181-210= 4 units 211-250= 5 units 251-280= 6 units 281-330= 8 units 331-400= 10 units If BS is greater than 400 inject 14 units    [provider]  LANTUS SOLOSTAR 100 UNIT/ML Solostar Pen Inject 17 Units into the skin daily. In am 07/06/16   [provider]  levETIRAcetam (KEPPRA) 500 MG tablet Take 500 mg by mouth 2 (two) times daily.      [provider]  lisinopril (PRINIVIL,ZESTRIL) 20 MG tablet Take 1 tablet (20 mg total) by mouth daily. 06/22/15   Catarina Hartshorn, MD  loperamide (IMODIUM) 2 MG capsule Take 2 mg by mouth daily as needed for diarrhea or loose stools.    [provider]  magnesium hydroxide (MILK OF MAGNESIA) 400 MG/5ML suspension Take 30 mLs by mouth daily as needed for mild constipation.    [provider]  magnesium oxide (MAG-OX) 400 MG tablet Take 400 mg by mouth daily.    [provider]  Melatonin 3 MG TABS Take 3 mg by mouth at bedtime.    [provider]  metFORMIN (GLUCOPHAGE) 500 MG tablet Take 500 mg by mouth 2 (two) times daily.     [provider]  mirtazapine (REMERON) 15 MG  tablet Take 15 mg by mouth at bedtime.    [provider]  neomycin-bacitracin-polymyxin (NEOSPORIN) ointment Apply 1 application topically daily as needed for wound care. apply to eye    [provider]  pantoprazole (PROTONIX) 40 MG tablet Take 1 tablet (40 mg total) by mouth daily. 06/22/15   Catarina Hartshorn, MD  phenytoin (DILANTIN) 100 MG ER capsule Take 3 capsules (300 mg total) by mouth daily. Start 06/23/2015 Patient taking differently: Take 200 mg by mouth every morning.  06/23/15   Catarina Hartshorn, MD  potassium chloride SA (K-DUR,KLOR-CON) 20 MEQ tablet Take 20 mEq by mouth daily.     [provider]  risperiDONE (RISPERDAL) 1 MG tablet Take 1 mg by mouth at bedtime.    [provider]    Family History Family History  Problem Relation Age  of Onset  . Heart disease Mother   . Alzheimer's disease Father   . Breast cancer Sister   . Colon cancer Unknown        unknown  . Diabetes Sister     Social History Social History  Substance Use Topics  . Smoking status: Former Smoker    Types: Cigars    Quit date: 04/14/1986  . Smokeless tobacco: Never Used     Comment: Quit cigars in the 1980s  . Alcohol use No     Allergies   Patient has no known allergies.   Review of Systems Review of Systems  Constitutional: Negative for chills and fever.  HENT: Negative for congestion and facial swelling.   Eyes: Negative for discharge and visual disturbance.  Respiratory: Positive for choking. Negative for shortness of breath.   Cardiovascular: Negative for chest pain and palpitations.  Gastrointestinal: Negative for abdominal pain, diarrhea and vomiting.  Musculoskeletal: Negative for arthralgias and myalgias.  Skin: Negative for color change and rash.  Neurological: Negative for tremors, syncope and headaches.  Psychiatric/Behavioral: Negative for confusion and dysphoric mood.     Physical Exam Updated Vital Signs SpO2 97%   Physical Exam    Constitutional: He is oriented to person, place, and time. He appears well-developed and well-nourished.  HENT:  Head: Normocephalic and atraumatic.  Eyes: Pupils are equal, round, and reactive to light. EOM are normal.  Neck: Normal range of motion. Neck supple. No JVD present.  Cardiovascular: Normal rate and regular rhythm.  Exam reveals no gallop and no friction rub.   No murmur heard. Pulmonary/Chest: No respiratory distress. He has no wheezes.  Abdominal: He exhibits no distension and no mass. There is no tenderness. There is no rebound and no guarding.  Musculoskeletal: Normal range of motion.  Neurological: He is alert and oriented to person, place, and time.  Skin: No rash noted. No pallor.  Psychiatric: He has a normal mood and affect. His behavior is normal.  Nursing note and vitals reviewed.    ED Treatments / Results  Labs (all labs ordered are listed, but only abnormal results are displayed) Labs Reviewed - No data to display  EKG  EKG Interpretation None       Radiology Dg Chest 2 View  Result Date: 07/26/2017 CLINICAL DATA:  Show today, Heimlich maneuver performed, cough and congestion EXAM: CHEST  2 VIEW COMPARISON:  Chest x-ray of 05/21/2013 FINDINGS: No active infiltrate or effusion is seen. Mediastinal and hilar contours are unremarkable. There is deviation of the tracheal air shadow to the right in the lower neck most consistent with enlargement of the thyroid gland which appears unchanged compared to the chest x-ray from 2014. The heart is within upper limits of normal. There are degenerative changes throughout the thoracic spine. IMPRESSION: 1. No active cardiopulmonary disease. 2. Probable unchanged enlargement of the left lobe of thyroid deviating the tracheal air shadow to the right of midline. Electronically Signed   By: Dwyane Dee M.D.   On: 07/26/2017 09:46    Procedures Procedures (including critical care time)  Medications Ordered in  ED Medications - No data to display   Initial Impression / Assessment and Plan / ED Course  I have reviewed the triage vital signs and the nursing notes.  Pertinent labs & imaging results that were available during my care of the patient were reviewed by me and considered in my medical decision making (see chart for details).     71 yo M  With a chief complaint of a possible aspiration event. Patient is well-appearing and nontoxic there is no continued coughing there is no adventitious lung sounds. Will obtain a chest x-ray.CXR negative.  D/c home.   9:53 AM:  I have discussed the diagnosis/risks/treatment options with the patient and believe the pt to be eligible for discharge home to follow-up with PCP. We also discussed returning to the ED immediately if new or worsening sx occur. We discussed the sx which are most concerning (e.g., sudden worsening pain, fever, inability to tolerate by mouth) that necessitate immediate return. Medications administered to the patient during their visit and any new prescriptions provided to the patient are listed below.  Medications given during this visit Medications - No data to display   The patient appears reasonably screen and/or stabilized for discharge and I doubt any other medical condition or other [facility] requiring further screening, evaluation, or treatment in the ED at this time prior to discharge.    Final Clinical Impressions(s) / ED Diagnoses   Final diagnoses:  Choking, initial encounter    New Prescriptions New Prescriptions   No medications on file     Melene Plan, DO 07/26/17 1610

## 2017-07-26 NOTE — ED Triage Notes (Signed)
Per EMS, [facility]. Staff reports he choked on his eggs during breakfast. The heimlich maneuver was performed but nothing came up. Pt with no complaints, CAO x 4, no respiratory distress noted.

## 2017-07-26 NOTE — Discharge Instructions (Signed)
Return for worsening cough, shortness of breath or fever.

## 2017-07-28 ENCOUNTER — Emergency Department (HOSPITAL_COMMUNITY): Payer: Medicare Other

## 2017-07-28 ENCOUNTER — Emergency Department (HOSPITAL_COMMUNITY)
Admission: EM | Admit: 2017-07-28 | Discharge: 2017-07-28 | Disposition: A | Discharge status: Home / Self Care | Payer: Medicare Other | Attending: Emergency Medicine | Admitting: Emergency Medicine

## 2017-07-28 ENCOUNTER — Encounter (HOSPITAL_COMMUNITY): Payer: Self-pay | Admitting: *Deleted

## 2017-07-28 DIAGNOSIS — Z87891 Personal history of nicotine dependence: Secondary | ICD-10-CM | POA: Diagnosis not present

## 2017-07-28 DIAGNOSIS — R1310 Dysphagia, unspecified: Secondary | ICD-10-CM | POA: Diagnosis not present

## 2017-07-28 DIAGNOSIS — E1122 Type 2 diabetes mellitus with diabetic chronic kidney disease: Secondary | ICD-10-CM | POA: Diagnosis not present

## 2017-07-28 DIAGNOSIS — Z79899 Other long term (current) drug therapy: Secondary | ICD-10-CM | POA: Diagnosis not present

## 2017-07-28 DIAGNOSIS — Z8673 Personal history of transient ischemic attack (TIA), and cerebral infarction without residual deficits: Secondary | ICD-10-CM | POA: Diagnosis not present

## 2017-07-28 DIAGNOSIS — T17228A Food in pharynx causing other injury, initial encounter: Secondary | ICD-10-CM | POA: Diagnosis not present

## 2017-07-28 DIAGNOSIS — R0989 Other specified symptoms and signs involving the circulatory and respiratory systems: Secondary | ICD-10-CM | POA: Insufficient documentation

## 2017-07-28 DIAGNOSIS — R51 Headache: Secondary | ICD-10-CM | POA: Diagnosis not present

## 2017-07-28 DIAGNOSIS — N189 Chronic kidney disease, unspecified: Secondary | ICD-10-CM | POA: Insufficient documentation

## 2017-07-28 DIAGNOSIS — E119 Type 2 diabetes mellitus without complications: Secondary | ICD-10-CM | POA: Insufficient documentation

## 2017-07-28 DIAGNOSIS — E785 Hyperlipidemia, unspecified: Secondary | ICD-10-CM | POA: Insufficient documentation

## 2017-07-28 DIAGNOSIS — I2510 Atherosclerotic heart disease of native coronary artery without angina pectoris: Secondary | ICD-10-CM | POA: Insufficient documentation

## 2017-07-28 DIAGNOSIS — T17308A Unspecified foreign body in larynx causing other injury, initial encounter: Secondary | ICD-10-CM

## 2017-07-28 DIAGNOSIS — I129 Hypertensive chronic kidney disease with stage 1 through stage 4 chronic kidney disease, or unspecified chronic kidney disease: Secondary | ICD-10-CM | POA: Insufficient documentation

## 2017-07-28 LAB — CBC WITH DIFFERENTIAL/PLATELET
Basophils Absolute: 0.1 10*3/uL (ref 0.0–0.1)
Basophils Relative: 1 %
EOS PCT: 11 %
Eosinophils Absolute: 0.9 10*3/uL — ABNORMAL HIGH (ref 0.0–0.7)
HCT: 36.2 % — ABNORMAL LOW (ref 39.0–52.0)
Hemoglobin: 12.3 g/dL — ABNORMAL LOW (ref 13.0–17.0)
LYMPHS PCT: 19 %
Lymphs Abs: 1.6 10*3/uL (ref 0.7–4.0)
MCH: 31.1 pg (ref 26.0–34.0)
MCHC: 34 g/dL (ref 30.0–36.0)
MCV: 91.4 fL (ref 78.0–100.0)
Monocytes Absolute: 0.6 10*3/uL (ref 0.1–1.0)
Monocytes Relative: 8 %
NEUTROS ABS: 5.1 10*3/uL (ref 1.7–7.7)
Neutrophils Relative %: 61 %
PLATELETS: 128 10*3/uL — AB (ref 150–400)
RBC: 3.96 MIL/uL — AB (ref 4.22–5.81)
RDW: 12.8 % (ref 11.5–15.5)
WBC: 8.3 10*3/uL (ref 4.0–10.5)

## 2017-07-28 LAB — I-STAT CHEM 8, ED
BUN: 17 mg/dL (ref 6–20)
Calcium, Ion: 1.19 mmol/L (ref 1.15–1.40)
Chloride: 100 mmol/L — ABNORMAL LOW (ref 101–111)
Creatinine, Ser: 0.9 mg/dL (ref 0.61–1.24)
GLUCOSE: 157 mg/dL — AB (ref 65–99)
HCT: 36 % — ABNORMAL LOW (ref 39.0–52.0)
Hemoglobin: 12.2 g/dL — ABNORMAL LOW (ref 13.0–17.0)
POTASSIUM: 3.6 mmol/L (ref 3.5–5.1)
Sodium: 142 mmol/L (ref 135–145)
TCO2: 30 mmol/L (ref 22–32)

## 2017-07-28 LAB — I-STAT TROPONIN, ED: TROPONIN I, POC: 0 ng/mL (ref 0.00–0.08)

## 2017-07-28 NOTE — ED Provider Notes (Signed)
[facility]-EMERGENCY DEPT Provider Note   CSN: [id number] Arrival date & time: 07/28/17  4098     History   Chief Complaint Chief Complaint  Patient presents with  . Choking    HPI Arthur Berry is a 71 y.o. male.  Patient is a 71 year old male with a past medical history of chronic kidney disease, coronary artery disease, diabetes, hypertension, stroke, vision changes presenting from nursing's facility today with a choking episode. Patient was seen approximately 2 days ago for similar symptoms. Patient states he was eating oatmeal this morning and began to choke in did not remember what happened after that. Per facility they performed the Heimlich maneuver with improvement of his symptoms. Patient believes he was just eating too quickly and that is why he choked. Prior to 2 days ago he has not had issues with choking in the past. He denies headache, vision changes, unilateral numbness or weakness. He complains of having a slight scratchy sensation in his throat but denies any difficulty swallowing his secretions. His voice sounds normal to him and he has no neck pain. He denies any recent medication changes that he is aware of.   The history is provided by the patient, the nursing home and the EMS personnel.    Past Medical History:  Diagnosis Date  . Adjustment reaction with aggression 05/14/2017  . Arthritis   . Chronic kidney disease   . Colon polyp 2005   Tubular Adenoma  . Coronary artery disease, non-occlusive    Minimal nonobstructive CAD, nl LV systolic fxn by cath 12/11/12  . Diabetes mellitus 2001   TYPE 2  . Diverticulosis   . Epilepsy (HCC)   . GERD (gastroesophageal reflux disease) 04/23/2014  . Goiter, nontoxic, multinodular    Thyroid US 11/2012  . Hyperlipidemia 1999  . Hypertension 1999  . Internal hemorrhoids   . Neuromuscular disorder (HCC)   . OSA (obstructive sleep apnea)   . Prostate enlargement   . Sleep apnea   . Splinter 04/27/13   metal plinter removed  rt pointer finger  . Stroke (HCC) 06/2015  . Vision changes 06/2015   since CVA    Patient Active Problem List   Diagnosis Date Noted  . Adjustment reaction with aggression 05/14/2017  . Type 2 diabetes mellitus with other circulatory complications (HCC) 06/19/2015  . Thrombocytopenia (HCC) 06/19/2015  . Cerebral infarction due to unspecified mechanism   . Dilantin toxicity 06/18/2015  . Phenytoin toxicity   . Stroke (HCC) 06/16/2015  . Fall 06/16/2015  . Epilepsy (HCC) 06/16/2015  . BPH (benign prostatic hyperplasia) 06/16/2015  . Leukocytosis 06/16/2015  . CVA (cerebral infarction) 06/16/2015  . CVA (cerebral vascular accident) (HCC) 06/15/2015  . GERD (gastroesophageal reflux disease) 04/23/2014  . Potassium (K) deficiency 12/11/2012  . Precordial pain 12/11/2012  . Dehydration 04/15/2012  . AKI (acute kidney injury) (HCC) 04/14/2012  . HLD (hyperlipidemia) 06/01/2010  . Essential hypertension, benign 06/01/2010  . DYSPNEA 06/01/2010  . Diabetes mellitus without complication (HCC) 11/04/2009  . RECTAL BLEEDING 11/04/2009  . PERSONAL HX COLONIC POLYPS 11/04/2009    Past Surgical History:  Procedure Laterality Date  . CHOLECYSTECTOMY    . COLONOSCOPY  12/28/2009  . ESOPHAGOGASTRODUODENOSCOPY  10/14/2012   normal   . INGUINAL HERNIA REPAIR  06/27/11   left  . KNEE SURGERY  2008   Left  . LEFT HEART CATHETERIZATION WITH CORONARY ANGIOGRAM N/A 12/11/2012   Procedure: LEFT HEART CATHETERIZATION WITH CORONARY ANGIOGRAM;  Surgeon: Tonny Bollman, MD;  Location:  [facility] CATH [facility];  Service: Cardiovascular;  Laterality: N/A;       Home Medications    Prior to Admission medications   Medication Sig Start Date End Date Taking? Authorizing Provider  acetaminophen (TYLENOL) 500 MG tablet Take 500 mg by mouth every 4 (four) hours as needed for moderate pain or headache.    Yes [provider]  alum & mag hydroxide-simeth (MINTOX) 200-200-20 MG/5ML suspension Take 30 mLs by  mouth as needed for indigestion or heartburn.    Yes [provider]  amLODipine (NORVASC) 2.5 MG tablet Take 2.5 mg by mouth daily.   Yes [provider]  aspirin 325 MG tablet Take 1 tablet (325 mg total) by mouth daily. 06/22/15  Yes Tat, Onalee Hua, MD  atorvastatin (LIPITOR) 40 MG tablet Take 40 mg by mouth daily at 6 PM.    Yes [provider]  camphor-menthol (MEN-PHOR) lotion Apply 1 application topically 2 (two) times daily as needed for itching.   Yes [provider]  carvedilol (COREG) 3.125 MG tablet Take 3.125 mg by mouth 2 (two) times daily with a meal.   Yes [provider]  Cholecalciferol (VITAMIN D3) 5000 units TABS Take 5,000 Units by mouth daily.    Yes [provider]  diphenhydrAMINE (BENADRYL) 25 MG tablet Take 25 mg by mouth every 6 (six) hours as needed for itching.   Yes [provider]  escitalopram (LEXAPRO) 5 MG tablet Take 10 mg by mouth daily.    Yes [provider]  galantamine (RAZADYNE ER) 16 MG 24 hr capsule Take 16 mg by mouth daily with breakfast.   Yes [provider]  guaifenesin (ROBAFEN) 100 MG/5ML syrup Take 200 mg by mouth every 6 (six) hours as needed for cough.   Yes [provider]  insulin aspart (NOVOLOG FLEXPEN) 100 UNIT/ML FlexPen Inject 0-14 Units into the skin See admin instructions. Inject 0-14 units twice daily per sliding scale. If sugar less than 120 units = 0 units, 121-150= 2 units, 151-180= 3 units, 181-210= 4 units, 211-250= 5 units, 251-280= 6 units, 281-330= 8 units, 331-400= 10 units, If BS is greater than 400 inject 14 units   Yes [provider]  LANTUS SOLOSTAR 100 UNIT/ML Solostar Pen Inject 5 Units into the skin daily. In am 07/06/16  Yes [provider]  levETIRAcetam (KEPPRA) 500 MG tablet Take 500 mg by mouth 2 (two) times daily.     Yes [provider]  lisinopril (PRINIVIL,ZESTRIL) 20 MG tablet Take 1 tablet (20 mg total)  by mouth daily. 06/22/15  Yes Tat, Onalee Hua, MD  loperamide (IMODIUM) 2 MG capsule Take 2 mg by mouth daily as needed for diarrhea or loose stools.   Yes [provider]  LORazepam (ATIVAN) 0.5 MG tablet Take 0.5 mg by mouth at bedtime.   Yes [provider]  magnesium hydroxide (MILK OF MAGNESIA) 400 MG/5ML suspension Take 30 mLs by mouth at bedtime as needed for mild constipation.    Yes [provider]  magnesium oxide (MAG-OX) 400 MG tablet Take 400 mg by mouth daily.   Yes [provider]  Melatonin 3 MG TABS Take 3 mg by mouth at bedtime.   Yes [provider]  metFORMIN (GLUCOPHAGE) 500 MG tablet Take 500 mg by mouth 2 (two) times daily.    Yes [provider]  mirtazapine (REMERON) 30 MG tablet Take 30 mg by mouth at bedtime.    Yes [provider]  Neomycin-Bacitracin-Polymyxin (  TRIPLE ANTIBIOTIC) [phone] OINT Apply 1 application topically as needed (for wound care until healed).   Yes [provider]  pantoprazole (PROTONIX) 40 MG tablet Take 1 tablet (40 mg total) by mouth daily. 06/22/15  Yes Tat, Onalee Hua, MD  phenytoin (DILANTIN) 100 MG ER capsule Take 3 capsules (300 mg total) by mouth daily. Start 06/23/2015 Patient taking differently: Take 200 mg by mouth every morning.  06/23/15  Yes Tat, Onalee Hua, MD  potassium chloride SA (K-DUR,KLOR-CON) 20 MEQ tablet Take 20 mEq by mouth daily.    Yes [provider]  risperiDONE (RISPERDAL) 2 MG tablet Take 2 mg by mouth at bedtime.    Yes [provider]  triamcinolone cream (KENALOG) 0.1 % Apply 1 application topically 2 (two) times daily as needed (for itching).   Yes [provider]    Family History Family History  Problem Relation Age of Onset  . Heart disease Mother   . Alzheimer's disease Father   . Breast cancer Sister   . Colon cancer Unknown        unknown  . Diabetes Sister     Social History Social History  Substance Use Topics    . Smoking status: Former Smoker    Types: Cigars    Quit date: 04/14/1986  . Smokeless tobacco: Never Used     Comment: Quit cigars in the 1980s  . Alcohol use No     Allergies   Patient has no known allergies.   Review of Systems Review of Systems  All other systems reviewed and are negative.    Physical Exam Updated Vital Signs BP 130/72 (BP Location: Left Arm)   Pulse 62   Temp 97.9 F (36.6 C)   Resp 14   SpO2 100%   Physical Exam  Constitutional: He is oriented to person, place, and time. He appears well-developed and well-nourished. No distress.  HENT:  Head: Normocephalic and atraumatic.  Dry mucous membranes  Eyes: Pupils are equal, round, and reactive to light. Conjunctivae and EOM are normal.  Neck: Trachea normal, normal range of motion and phonation normal. Neck supple. No tracheal tenderness present. No tracheal deviation present. No thyroid mass and no thyromegaly present.  Cardiovascular: Normal rate, regular rhythm and intact distal pulses.   No murmur heard. Pulmonary/Chest: Effort normal and breath sounds normal. No stridor. No respiratory distress. He has no wheezes. He has no rales.  Abdominal: Soft. He exhibits no distension. There is no tenderness. There is no rebound and no guarding.  Musculoskeletal: Normal range of motion. He exhibits edema. He exhibits no tenderness.  Trace edema in bilateral lower extremities  Neurological: He is alert and oriented to person, place, and time. He has normal strength. No cranial nerve deficit or sensory deficit.  No visual field cuts or aphasia.  Finger to nose wnl.  Skin: Skin is warm and dry. No rash noted. No erythema.  Psychiatric: He has a normal mood and affect. His behavior is normal.  Nursing note and vitals reviewed.    ED Treatments / Results  Labs (all labs ordered are listed, but only abnormal results are displayed) Labs Reviewed  CBC WITH DIFFERENTIAL/PLATELET - Abnormal; Notable for the  following:       Result Value   RBC 3.96 (*)    Hemoglobin 12.3 (*)    HCT 36.2 (*)    Platelets 128 (*)    Eosinophils Absolute 0.9 (*)    All other components within normal limits  I-STAT CHEM  8, ED - Abnormal; Notable for the following:    Chloride 100 (*)    Glucose, Bld 157 (*)    Hemoglobin 12.2 (*)    HCT 36.0 (*)    All other components within normal limits  I-STAT TROPONIN, ED    EKG  EKG Interpretation  Date/Time:  Saturday July 28 2017 10:25:56 EDT Ventricular Rate:  61 PR Interval:    QRS Duration: 91 QT Interval:  418 QTC Calculation: 421 R Axis:   65 Text Interpretation:  Sinus rhythm Low voltage, precordial leads Baseline wander in lead(s) V5 No significant change since last tracing Confirmed by Gwyneth Sprout ([id number]) on 07/28/2017 10:41:50 AM       Radiology Ct Head Wo Contrast  Result Date: 07/28/2017 CLINICAL DATA:  Choking this morning. EXAM: CT HEAD WITHOUT CONTRAST TECHNIQUE: Contiguous axial images were obtained from the base of the skull through the vertex without intravenous contrast. COMPARISON:  Apr 09, 2017 and May 04, 2017 FINDINGS: Brain: No subdural, epidural, or subarachnoid hemorrhage. Chronic atrophy of the cerebellum. Basal cisterns remain patent. Brainstem is unchanged. Severe ventriculomegaly with porencephalic changes, stable in the interval. Similar left greater than right interhemispheric cysts again identified. Agenesis of the corpus callosum again noted. Left frontal cortical dysplasia is stable. Old right occipital infarct is stable. Stable white matter changes. No acute cortical ischemia or infarct is noted. Vascular: No hyperdense vessel or unexpected calcification. Skull: Normal. Negative for fracture or focal lesion. Sinuses/Orbits: No acute opacification of paranasal sinuses, mastoid air cells, or middle ears. Mild scattered mucosal thickening in ethmoid sinuses is stable. Other: None. IMPRESSION: 1. The brain is stable as  above.  No acute abnormalities. Electronically Signed   By: Gerome Sam III M.D   On: 07/28/2017 11:27    Procedures Procedures (including critical care time)  Medications Ordered in ED Medications - No data to display   Initial Impression / Assessment and Plan / ED Course  I have reviewed the triage vital signs and the nursing notes.  Pertinent labs & imaging results that were available during my care of the patient were reviewed by me and considered in my medical decision making (see chart for details).     Patient returning today for repeat episode of choking. Initial episode occurred 2 days ago and has now had a second episode requiring the Heimlich maneuver. Patient currently is asymptomatic however when a swallow test done here he immediately choked on crackers and water.  Patient has multiple risk factors for stroke and concern that he has had additional stroke causing his swallowing difficulty. Low suspicion for mass as the cause. Could be medication related or other mechanical issue. Labs without acute findings. Will do a head CT to evaluate for new stroke.  12:08 PM Head CT without signs of new stroke and labs without acute findings. On reevaluation patient was able to swallow water and applesauce without difficulty. Recommend the patient be placed on a mechanical soft diet until swallow studies can be performed.    Final Clinical Impressions(s) / ED Diagnoses   Final diagnoses:  Choking, initial encounter    New Prescriptions New Prescriptions   No medications on file     Gwyneth Sprout, MD 07/28/17 1219

## 2017-07-28 NOTE — ED Notes (Signed)
1.5 mg Ativan wasted in pyxis with Charlett Blake RN

## 2017-07-28 NOTE — ED Notes (Signed)
Pt in CT.

## 2017-07-28 NOTE — ED Notes (Signed)
Pt able to stand at bedside and use urinal with the EMT.

## 2017-07-28 NOTE — ED Notes (Addendum)
Pt had problems swallowing crackers during swallow screen earlier. Dr Anitra Lauth ordered to trial the pt with water and something thickened. Pt given applesauce and is having no difficulty swallowing either that or water. Breath sounds clear.

## 2017-07-28 NOTE — Discharge Instructions (Signed)
Currently stay on a mechanical soft diet to further testing to prevent choking

## 2017-07-28 NOTE — ED Notes (Signed)
Pt d/c home with brother driving. Pt is going back to [facility].

## 2017-07-28 NOTE — ED Notes (Signed)
This RN called [facility] and got the answering machine. I left a message for them to call me back regarding the pt's care coming back there. Pt will be placed on a soft pureed diet and will need a swallow evaluation there. Pt's family and pt informed of this and no further questions. Pt stable, VSS, NAD.

## 2017-07-28 NOTE — ED Triage Notes (Signed)
Pt chocked on  Oatmeal  This morning  during berakfe[facility] . EMS reported  SNF staff preformed heimlich maneuver  To clear airway. Pt  In no acute distress on arrival to ED.

## 2017-08-07 DIAGNOSIS — B86 Scabies: Secondary | ICD-10-CM | POA: Diagnosis not present

## 2017-08-14 ENCOUNTER — Encounter: Payer: Self-pay | Admitting: Diagnostic Neuroimaging

## 2017-08-14 ENCOUNTER — Ambulatory Visit (INDEPENDENT_AMBULATORY_CARE_PROVIDER_SITE_OTHER): Payer: Medicare Other | Admitting: Diagnostic Neuroimaging

## 2017-08-14 VITALS — BP 140/78 | HR 64 | Wt 181.0 lb

## 2017-08-14 DIAGNOSIS — R1312 Dysphagia, oropharyngeal phase: Secondary | ICD-10-CM | POA: Diagnosis not present

## 2017-08-14 DIAGNOSIS — I69321 Dysphasia following cerebral infarction: Secondary | ICD-10-CM | POA: Diagnosis not present

## 2017-08-14 DIAGNOSIS — R1319 Other dysphagia: Secondary | ICD-10-CM | POA: Diagnosis not present

## 2017-08-14 DIAGNOSIS — G40909 Epilepsy, unspecified, not intractable, without status epilepticus: Secondary | ICD-10-CM | POA: Diagnosis not present

## 2017-08-14 DIAGNOSIS — I63433 Cerebral infarction due to embolism of bilateral posterior cerebral arteries: Secondary | ICD-10-CM

## 2017-08-14 NOTE — Progress Notes (Signed)
[facility]  PATIENT: Arthur Berry DOB: 05-May-1946  REFERRING CLINICIAN:  HISTORY FROM: patient  REASON FOR VISIT: follow up    HISTORICAL  CHIEF COMPLAINT:  Chief Complaint  Patient presents with  . Cerebrovascular Accident    rm 6, [employer], [place] , "no new concerns"  . Follow-up    1 year    HISTORY OF PRESENT ILLNESS:   UPDATE (08/14/17, VRP): Since last visit, doing well. No report of seizures. Tolerating LEV and DPH. No alleviating or aggravating factors. No other concerns per patient today.  UPDATE 08/14/16: Since last visit, doing about the same. No seizures. No new stroke symptoms. Tolerating meds.   UPDATE 02/16/16: Since last visit, no seizures. Now living in [facility] assisted living. Overall stable neurologically. However, had some issues with sugar levels and needed to have ER visit recently. Now sugars are improved.   UPDATE 08/17/15: Since last visit, patient had was admitted for syncope and stroke in Aug 2016. Records reviewed and appreciate stroke team and medical hospitalists for their assistance. Also had toxic dilantin level and now dilantin dosing adjusted. Now living at [facility]. No seizures. Overall doing well now. In good spirits.   UPDATE 07/31/12: Retired in May 2013. June 2013, had episode at home of fall, trapped in recliner x 2 days, and subsequent rhabdo and dehydration. was at [facility] x 4 weeks, now back home.  Over past 2-3 months, progressive gait decline, urinary incontinence, and cognitive decline. Not using CPAP machine x 3 months (out of filters). Has had low dilantin levels, although he says he was taking meds. Living alone. Continues to drive, inspite of high epworth scores and recommendation not to drive per Dr. Vickey Huger due to hypersomnia.   UPDATE 02/06/12: Since last visit, co-workers have noticed patient "falling asleep" on the job. No one available to talk to. Unclear if pts  eyes open or closed during spells. Pt doesn't feel like he falls asleep. Has insomnia durign weekdays, but not during weekends. More stress related to job.  UPDATE 08/25/11: Doing well; no seizures.  Back to driving, but careful when he is tired (will ask others to drive).  Tolerating LEV and DPH.    PRIOR HPI:  71 year old left-handed male with history of seizures since the age of 71 years old years old, presenting for intermittent episodes of zoning out, difficulty with concentration and memory problems for the past 2-3 months.  Patient is accompanied by his sister today. Patient had a normal birth and development.  He was diagnosed with seizures at the age of 71 years old old. He was started on chlordiazepoxide and phenytoin at age of 71 or 71 years old and phenytoin at age of 71 or 71 years old years old. He did have some difficulty with school having to repeat the fifth, sixth, seventh and eighth grades.  He ultimately graduated with a GED. Patient's typical seizures in the past involved distortion of sound were things felt like they were "miles away". He reports generalized convulsions where he was able to hear people around him but unable to speak. He denies tongue biting or incontinence. His last reported seizure was in 1987. Over the past 2-3 months he has been having episodes of "zoning out" at work. It had been happening 1- 2 times per week. He has no warning for these events.  He is unaware of these events. Reportedly coworkers called his name during these events he did not respond. On September 16 he was in a meeting and a coworker called his name, walked over  and tapped his shoulder, he still did not respond for approximately 30 seconds. The patient's sister is asking if there are newer seizure medications which may have less side effects than chlordiazepoxide and phenytoin.  There has been some gradual difficulty with concentration, thinking and memory over the past several years.   REVIEW OF SYSTEMS: Full 14 system review of systems performed and negative  except as per HPI.   ALLERGIES: No Known Allergies  HOME MEDICATIONS: Outpatient Medications Prior to Visit  Medication Sig Dispense Refill  . acetaminophen (TYLENOL) 500 MG tablet Take 500 mg by mouth every 4 (four) hours as needed for moderate pain or headache.     Marland Kitchen alum & mag hydroxide-simeth (MINTOX) 200-200-20 MG/5ML suspension Take 30 mLs by mouth as needed for indigestion or heartburn.     Marland Kitchen amLODipine (NORVASC) 2.5 MG tablet Take 2.5 mg by mouth daily.    Marland Kitchen aspirin 325 MG tablet Take 1 tablet (325 mg total) by mouth daily. 30 tablet 0  . atorvastatin (LIPITOR) 40 MG tablet Take 40 mg by mouth daily at 6 PM.     . camphor-menthol (MEN-PHOR) lotion Apply 1 application topically 2 (two) times daily as needed for itching.    . carvedilol (COREG) 3.125 MG tablet Take 3.125 mg by mouth 2 (two) times daily with a meal.    . Cholecalciferol (VITAMIN D3) 5000 units TABS Take 5,000 Units by mouth daily.     . diphenhydrAMINE (BENADRYL) 25 MG tablet Take 25 mg by mouth every 6 (six) hours as needed for itching.    . escitalopram (LEXAPRO) 5 MG tablet Take 10 mg by mouth daily.     Marland Kitchen galantamine (RAZADYNE ER) 16 MG 24 hr capsule Take 16 mg by mouth daily with breakfast.    . guaifenesin (ROBAFEN) 100 MG/5ML syrup Take 200 mg by mouth every 6 (six) hours as needed for cough.    . insulin aspart (NOVOLOG FLEXPEN) 100 UNIT/ML FlexPen Inject 0-14 Units into the skin See admin instructions. Inject 0-14 units twice daily per sliding scale. If sugar less than 120 units = 0 units, 121-150= 2 units, 151-180= 3 units, 181-210= 4 units, 211-250= 5 units, 251-280= 6 units, 281-330= 8 units, 331-400= 10 units, If BS is greater than 400 inject 14 units    . LANTUS SOLOSTAR 100 UNIT/ML Solostar Pen Inject 5 Units into the skin daily. In am    . levETIRAcetam (KEPPRA) 500 MG tablet Take 500 mg by mouth 2 (two) times daily.      Marland Kitchen lisinopril (PRINIVIL,ZESTRIL) 20 MG tablet Take 1 tablet (20 mg total) by mouth  daily. 30 tablet 0  . loperamide (IMODIUM) 2 MG capsule Take 2 mg by mouth daily as needed for diarrhea or loose stools.    Marland Kitchen LORazepam (ATIVAN) 0.5 MG tablet Take 0.5 mg by mouth at bedtime.    . magnesium hydroxide (MILK OF MAGNESIA) 400 MG/5ML suspension Take 30 mLs by mouth at bedtime as needed for mild constipation.     . magnesium oxide (MAG-OX) 400 MG tablet Take 400 mg by mouth daily.    . Melatonin 3 MG TABS Take 3 mg by mouth at bedtime.    . metFORMIN (GLUCOPHAGE) 500 MG tablet Take 500 mg by mouth 2 (two) times daily.     . mirtazapine (REMERON) 30 MG tablet Take 30 mg by mouth at bedtime.     Marland Kitchen Neomycin-Bacitracin-Polymyxin (TRIPLE ANTIBIOTIC) 3.5-531 188 1697 OINT Apply 1 application topically as needed (for wound care  until healed).    . pantoprazole (PROTONIX) 40 MG tablet Take 1 tablet (40 mg total) by mouth daily. 60 tablet 0  . phenytoin (DILANTIN) 100 MG ER capsule Take 3 capsules (300 mg total) by mouth daily. Start 06/23/2015 (Patient taking differently: Take 200 mg by mouth every morning. ) 30 capsule 0  . potassium chloride SA (K-DUR,KLOR-CON) 20 MEQ tablet Take 20 mEq by mouth daily.     . risperiDONE (RISPERDAL) 2 MG tablet Take 2 mg by mouth at bedtime.     . triamcinolone cream (KENALOG) 0.1 % Apply 1 application topically 2 (two) times daily as needed (for itching).     No facility-administered medications prior to visit.     PAST MEDICAL HISTORY: Past Medical History:  Diagnosis Date  . Adjustment reaction with aggression 05/14/2017  . Arthritis   . Chronic kidney disease   . Colon polyp 2005   Tubular Adenoma  . Coronary artery disease, non-occlusive    Minimal nonobstructive CAD, nl LV systolic fxn by cath 12/11/12  . Diabetes mellitus 2001   TYPE 2  . Diverticulosis   . Epilepsy (HCC)   . GERD (gastroesophageal reflux disease) 04/23/2014  . Goiter, nontoxic, multinodular    Thyroid US 11/2012  . Hyperlipidemia 1999  . Hypertension 1999  . Internal  hemorrhoids   . Neuromuscular disorder (HCC)   . OSA (obstructive sleep apnea)   . Prostate enlargement   . Sleep apnea   . Splinter 04/27/13   metal plinter removed rt pointer finger  . Stroke (HCC) 06/2015  . Vision changes 06/2015   since CVA    PAST SURGICAL HISTORY: Past Surgical History:  Procedure Laterality Date  . CHOLECYSTECTOMY    . COLONOSCOPY  12/28/2009  . ESOPHAGOGASTRODUODENOSCOPY  10/14/2012   normal   . INGUINAL HERNIA REPAIR  06/27/11   left  . KNEE SURGERY  2008   Left  . LEFT HEART CATHETERIZATION WITH CORONARY ANGIOGRAM N/A 12/11/2012   Procedure: LEFT HEART CATHETERIZATION WITH CORONARY ANGIOGRAM;  Surgeon: Tonny Bollman, MD;  Location[facility] C[facility] LAB;  Service: Cardiovascular;  Laterality: N/A;    FAMILY HISTORY: Family History  Problem Relation Age of Onset  . Heart disease Mother   . Alzheimer's disease Father   . Breast cancer Sister   . Colon cancer Unknown        unknown  . Diabetes Sister     SOCIAL HISTORY:  Social History   Social History  . Marital status: Divorced    Spouse name: N/A  . Number of children: 0  . Years of education: N/A   Occupational History  . Entrance assistant [employer]   Social History Main Topics  . Smoking status: Former Smoker    Types: Cigars    Quit date: 04/14/1986  . Smokeless tobacco: Never Used     Comment: Quit cigars in the 1980s  . Alcohol use No  . Drug use: No  . Sexual activity: No   Other Topics Concern  . Not on file   Social History Narrative  . No narrative on file     PHYSICAL EXAM  GENERAL EXAM/CONSTITUTIONAL: Vitals:  Vitals:   08/14/17 1001  BP: 140/78  Pulse: 64  Weight: 181 lb (82.1 kg)   Body mass index is 25.97 kg/m. No exam data present  Patient is in no distress; well developed, nourished and groomed; neck is supple  CARDIOVASCULAR:  Examination of carotid arteries is normal; no carotid bruits  Regular  rate and rhythm, no murmurs  Examination of  peripheral vascular system by observation and palpation is normal  EYES:  Ophthalmoscopic exam of optic discs and posterior segments is normal; no papilledema or hemorrhages  MUSCULOSKELETAL:  Gait, strength, tone, movements noted in Neurologic exam below  NEUROLOGIC: MENTAL STATUS:  No flowsheet data found.  awake, alert, oriented to person, place and time  recent and remote memory intact  normal attention and concentration  language fluent, comprehension intact, naming intact,   fund of knowledge appropriate  CRANIAL NERVE:   2nd - no papilledema on fundoscopic exam  2nd, 3rd, 4th, 6th - pupils equal and reactive to light, visual fields --> LEFT HOMONYMOUS HEMIANOPSIA, extraocular muscles intact, no nystagmus  5th - facial sensation symmetric  7th - facial strength symmetric  8th - hearing intact  9th - palate elevates symmetrically, uvula midline  11th - shoulder shrug symmetric  12th - tongue protrusion midline  MOTOR:   normal bulk and tone, full strength in the BUE, BLE; SLIGHTLY SLOWER ON LEFT SIDE  SENSORY:   normal and symmetric to light touch, temperature, vibration  COORDINATION:   finger-nose-finger, fine finger movements normal  REFLEXES:   deep tendon reflexes TRACE and symmetric  GAIT/STATION:   narrow based gait; USES A WALKER    DIAGNOSTIC DATA (LABS, IMAGING, TESTING) - I reviewed patient records, labs, notes, testing and imaging myself where available.  Lab Results  Component Value Date   WBC 8.3 07/28/2017   HGB 12.2 (L) 07/28/2017   HCT 36.0 (L) 07/28/2017   MCV 91.4 07/28/2017   PLT 128 (L) 07/28/2017      Component Value Date/Time   NA 142 07/28/2017 1032   K 3.6 07/28/2017 1032   CL 100 (L) 07/28/2017 1032   CO2 30 05/13/2017 2100   GLUCOSE 157 (H) 07/28/2017 1032   BUN 17 07/28/2017 1032   CREATININE 0.90 07/28/2017 1032   CALCIUM 9.3 05/13/2017 2100   PROT 6.9 05/13/2017 2100   ALBUMIN 3.5 05/13/2017 2100     AST 28 05/13/2017 2100   ALT 27 05/13/2017 2100   ALKPHOS 120 05/13/2017 2100   BILITOT 0.5 05/13/2017 2100   GFRNONAA >60 05/13/2017 2100   GFRAA >60 05/13/2017 2100   Lab Results  Component Value Date   CHOL 101 06/16/2015   HDL 33 (L) 06/16/2015   LDLCALC 51 06/16/2015   TRIG 85 06/16/2015   CHOLHDL 3.1 06/16/2015   Lab Results  Component Value Date   HGBA1C 7.0 (H) 06/16/2015   Lab Results  Component Value Date   VITAMINB12 487 06/20/2015   Lab Results  Component Value Date   TSH 0.800 12/12/2012   06/16/15 MRI brain [I reviewed images myself and agree with interpretation. -VRP]  1. Limited study with only diffusion-weighted sequences performed. 2. Acute ischemic right PCA territory infarct without significant mass effect. 3. Additional small 6 mm acute ischemic infarct within the left occipital lobe. 4. 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 5. Small right frontal scalp contusion.  06/17/15 CTA head/neck [I reviewed images myself and agree with interpretation. -VRP]  1. Moderate proximal right PCA stenosis corresponds with the right occipital lobe infarct. 2. No significant change in the size the right occipital lobe infarct or hemorrhage. 3. Remote encephalomalacia of the anterior left frontal lobe. 4. Midline cyst on the left with mass effect as described. 5. Mild diffuse small vessel disease evident on the MRA. 6. Multilevel degenerative  changes throughout the cervical spine are most pronounced at C3-4 and C5-6. 7. Atherosclerotic changes within the carotid bifurcations bilaterally without significant stenosis.  06/17/15 TTE - Left ventricle: The cavity size was normal. There was moderate concentric hypertrophy. Systolic function was normal. Theestimated ejection fraction was in the range of 60% to 65%. Wall motion was normal; there were no regional wall motionabnormalities. Doppler parameters are  consistent with abnormalleft ventricular relaxation (grade 1 diastolic dysfunction).  06/24/15 cardiac monitor - On event monitor no PAF, some other fast HR but nothing to cause stroke.   09/10/15 CT head [I reviewed images myself and agree with interpretation. -VRP]  - No evidence of acute intracranial abnormality. - Encephalomalacic changes related to prior right PCA distribution infarct. - Additional stable chronic changes, as above.    ASSESSMENT AND PLAN  71 y.o. year old male here with stroke (right > left PCA), possibly due to intracranial atherosclerosis vs cardio-embolic. Also with seizure disorder (on LEV and DPH). Overall stable.    Dx:  Seizure disorder [facility])  Cerebrovascular accident (CVA) due to bilateral embolism of posterior cerebral arteries (HCC)    PLAN: I spent 15 minutes of face to face time with patient. Greater than 50% of time was spent in counseling and coordination of care with patient. In summary we discussed:  STROKE PREVENTION - secondary stroke prevention (aspirin  daily, statin, BP control, diabetes control)  SEIZURE DISORDER - continue (levetiracetam  twice a day + dilantin  daily) for seizure prevention  Return if symptoms worsen or fail to improve, for return to PCP.    Suanne Marker, MD 08/14/2017, 10:46 AM Certified in Neurology, Neurophysiology and Neuroimaging  [facility] [address] [place], [place] [address] [phone]

## 2017-08-17 DIAGNOSIS — I69321 Dysphasia following cerebral infarction: Secondary | ICD-10-CM | POA: Diagnosis not present

## 2017-08-17 DIAGNOSIS — R1312 Dysphagia, oropharyngeal phase: Secondary | ICD-10-CM | POA: Diagnosis not present

## 2017-08-17 DIAGNOSIS — R1319 Other dysphagia: Secondary | ICD-10-CM | POA: Diagnosis not present

## 2017-08-19 ENCOUNTER — Emergency Department (HOSPITAL_COMMUNITY)
Admission: EM | Admit: 2017-08-19 | Discharge: 2017-08-19 | Disposition: A | Discharge status: Home / Self Care | Payer: Medicare Other | Attending: Emergency Medicine | Admitting: Emergency Medicine

## 2017-08-19 ENCOUNTER — Emergency Department (HOSPITAL_COMMUNITY): Payer: Medicare Other

## 2017-08-19 ENCOUNTER — Encounter (HOSPITAL_COMMUNITY): Payer: Self-pay

## 2017-08-19 DIAGNOSIS — W19XXXA Unspecified fall, initial encounter: Secondary | ICD-10-CM

## 2017-08-19 DIAGNOSIS — Z794 Long term (current) use of insulin: Secondary | ICD-10-CM | POA: Diagnosis not present

## 2017-08-19 DIAGNOSIS — W01190A Fall on same level from slipping, tripping and stumbling with subsequent striking against furniture, initial encounter: Secondary | ICD-10-CM | POA: Diagnosis not present

## 2017-08-19 DIAGNOSIS — S199XXA Unspecified injury of neck, initial encounter: Secondary | ICD-10-CM | POA: Diagnosis not present

## 2017-08-19 DIAGNOSIS — E119 Type 2 diabetes mellitus without complications: Secondary | ICD-10-CM | POA: Insufficient documentation

## 2017-08-19 DIAGNOSIS — Y999 Unspecified external cause status: Secondary | ICD-10-CM | POA: Diagnosis not present

## 2017-08-19 DIAGNOSIS — N189 Chronic kidney disease, unspecified: Secondary | ICD-10-CM | POA: Insufficient documentation

## 2017-08-19 DIAGNOSIS — Z79899 Other long term (current) drug therapy: Secondary | ICD-10-CM | POA: Insufficient documentation

## 2017-08-19 DIAGNOSIS — S0090XA Unspecified superficial injury of unspecified part of head, initial encounter: Secondary | ICD-10-CM | POA: Diagnosis not present

## 2017-08-19 DIAGNOSIS — G8911 Acute pain due to trauma: Secondary | ICD-10-CM | POA: Diagnosis not present

## 2017-08-19 DIAGNOSIS — S0990XA Unspecified injury of head, initial encounter: Secondary | ICD-10-CM | POA: Insufficient documentation

## 2017-08-19 DIAGNOSIS — Y92129 Unspecified place in nursing home as the place of occurrence of the external cause: Secondary | ICD-10-CM | POA: Diagnosis not present

## 2017-08-19 DIAGNOSIS — Y9301 Activity, walking, marching and hiking: Secondary | ICD-10-CM | POA: Diagnosis not present

## 2017-08-19 DIAGNOSIS — I1310 Hypertensive heart and chronic kidney disease without heart failure, with stage 1 through stage 4 chronic kidney disease, or unspecified chronic kidney disease: Secondary | ICD-10-CM | POA: Insufficient documentation

## 2017-08-19 DIAGNOSIS — R51 Headache: Secondary | ICD-10-CM | POA: Diagnosis not present

## 2017-08-19 DIAGNOSIS — Z87891 Personal history of nicotine dependence: Secondary | ICD-10-CM | POA: Diagnosis not present

## 2017-08-19 DIAGNOSIS — I2510 Atherosclerotic heart disease of native coronary artery without angina pectoris: Secondary | ICD-10-CM | POA: Diagnosis not present

## 2017-08-19 NOTE — ED Triage Notes (Signed)
Per EMS, patient was walking to the dining room when he fell and hit his head on the side of the nurses station desk. No swelling, bleeding or lac noted. Pt c/o head pain. Hx dementia, follows commands oriented to person and place. 163 CBG, insulin dependent.

## 2017-08-19 NOTE — ED Notes (Signed)
Attempted to call report to [facility], message left at nurses station.

## 2017-08-19 NOTE — Discharge Instructions (Signed)
The CT scans did not show any broken bones or serious injuries.  Take tylenol as needed for pain.  Monitor for vomiting, worsening symptoms

## 2017-08-19 NOTE — ED Notes (Signed)
[facility] called for transport.  

## 2017-08-19 NOTE — ED Provider Notes (Signed)
[facility]-EMERGENCY DEPT Provider Note   CSN: [id number] Arrival date & time: 08/19/17  1217     History   Chief Complaint Chief Complaint  Patient presents with  . Fall    HPI Arthur Berry is a 71 y.o. male.  HPI Patient presents to the emergency room for evaluation of the head and neck injury. The patient is a resident of a nursing home. He was walking to the dining room when he fell and hit his head at the nursing station desk. Patient is unsure if he lost consciousness. He denies any trouble with any nausea or vomiting. He does have some neck pain. The patient denies any other complaints of weakness. No extremity injuries. No chest pain, shortness of breath or other complaints Past Medical History:  Diagnosis Date  . Adjustment reaction with aggression 05/14/2017  . Arthritis   . Chronic kidney disease   . Colon polyp 2005   Tubular Adenoma  . Coronary artery disease, non-occlusive    Minimal nonobstructive CAD, nl LV systolic fxn by cath 12/11/12  . Diabetes mellitus 2001   TYPE 2  . Diverticulosis   . Epilepsy (HCC)   . GERD (gastroesophageal reflux disease) 04/23/2014  . Goiter, nontoxic, multinodular    Thyroid US 11/2012  . Hyperlipidemia 1999  . Hypertension 1999  . Internal hemorrhoids   . Neuromuscular disorder (HCC)   . OSA (obstructive sleep apnea)   . Prostate enlargement   . Sleep apnea   . Splinter 04/27/13   metal plinter removed rt pointer finger  . Stroke (HCC) 06/2015  . Vision changes 06/2015   since CVA    Patient Active Problem List   Diagnosis Date Noted  . Adjustment reaction with aggression 05/14/2017  . Type 2 diabetes mellitus with other circulatory complications (HCC) 06/19/2015  . Thrombocytopenia (HCC) 06/19/2015  . Cerebral infarction due to unspecified mechanism   . Dilantin toxicity 06/18/2015  . Phenytoin toxicity   . Stroke (HCC) 06/16/2015  . Fall 06/16/2015  . Epilepsy (HCC) 06/16/2015  . BPH (benign prostatic hyperplasia)  06/16/2015  . Leukocytosis 06/16/2015  . CVA (cerebral infarction) 06/16/2015  . CVA (cerebral vascular accident) (HCC) 06/15/2015  . GERD (gastroesophageal reflux disease) 04/23/2014  . Potassium (K) deficiency 12/11/2012  . Precordial pain 12/11/2012  . Dehydration 04/15/2012  . AKI (acute kidney injury) (HCC) 04/14/2012  . HLD (hyperlipidemia) 06/01/2010  . Essential hypertension, benign 06/01/2010  . DYSPNEA 06/01/2010  . Diabetes mellitus without complication (HCC) 11/04/2009  . RECTAL BLEEDING 11/04/2009  . PERSONAL HX COLONIC POLYPS 11/04/2009    Past Surgical History:  Procedure Laterality Date  . CHOLECYSTECTOMY    . COLONOSCOPY  12/28/2009  . ESOPHAGOGASTRODUODENOSCOPY  10/14/2012   normal   . INGUINAL HERNIA REPAIR  06/27/11   left  . KNEE SURGERY  2008   Left  . LEFT HEART CATHETERIZATION WITH CORONARY ANGIOGRAM N/A 12/11/2012   Procedure: LEFT HEART CATHETERIZATION WITH CORONARY ANGIOGRAM;  Surgeon: Tonny Bollman, MD;  Location: [facility] CATH LAB;  Service: Cardiovascular;  Laterality: N/A;       Home Medications    Prior to Admission medications   Medication Sig Start Date End Date Taking? Authorizing Provider  acetaminophen (TYLENOL) 500 MG tablet Take 500 mg by mouth every 4 (four) hours as needed for moderate pain or headache.     [provider]  alum & mag hydroxide-simeth (MINTOX) 200-200-20 MG/5ML suspension Take 30 mLs by mouth as needed for indigestion or heartburn.  [provider]  amLODipine (NORVASC) 2.5 MG tablet Take 2.5 mg by mouth daily.    [provider]  aspirin 325 MG tablet Take 1 tablet (325 mg total) by mouth daily. 06/22/15   Catarina Hartshorn, MD  atorvastatin (LIPITOR) 40 MG tablet Take 40 mg by mouth daily at 6 PM.     [provider]  camphor-menthol (MEN-PHOR) lotion Apply 1 application topically 2 (two) times daily as needed for itching.    [provider]  carvedilol (COREG) 3.125 MG tablet Take  3.125 mg by mouth 2 (two) times daily with a meal.    [provider]  Cholecalciferol (VITAMIN D3) 5000 units TABS Take 5,000 Units by mouth daily.     [provider]  diphenhydrAMINE (BENADRYL) 25 MG tablet Take 25 mg by mouth every 6 (six) hours as needed for itching.    [provider]  escitalopram (LEXAPRO) 5 MG tablet Take 10 mg by mouth daily.     [provider]  galantamine (RAZADYNE ER) 16 MG 24 hr capsule Take 16 mg by mouth daily with breakfast.    [provider]  guaifenesin (ROBAFEN) 100 MG/5ML syrup Take 200 mg by mouth every 6 (six) hours as needed for cough.    [provider]  insulin aspart (NOVOLOG FLEXPEN) 100 UNIT/ML FlexPen Inject 0-14 Units into the skin See admin instructions. Inject 0-14 units twice daily per sliding scale. If sugar less than 120 units = 0 units, 121-150= 2 units, 151-180= 3 units, 181-210= 4 units, 211-250= 5 units, 251-280= 6 units, 281-330= 8 units, 331-400= 10 units, If BS is greater than 400 inject 14 units    [provider]  LANTUS SOLOSTAR 100 UNIT/ML Solostar Pen Inject 5 Units into the skin daily. In am 07/06/16   [provider]  levETIRAcetam (KEPPRA) 500 MG tablet Take 500 mg by mouth 2 (two) times daily.      [provider]  lisinopril (PRINIVIL,ZESTRIL) 20 MG tablet Take 1 tablet (20 mg total) by mouth daily. 06/22/15   Catarina Hartshorn, MD  loperamide (IMODIUM) 2 MG capsule Take 2 mg by mouth daily as needed for diarrhea or loose stools.    [provider]  LORazepam (ATIVAN) 0.5 MG tablet Take 0.5 mg by mouth at bedtime.    [provider]  magnesium hydroxide (MILK OF MAGNESIA) 400 MG/5ML suspension Take 30 mLs by mouth at bedtime as needed for mild constipation.     [provider]  magnesium oxide (MAG-OX) 400 MG tablet Take 400 mg by mouth daily.    [provider]  Melatonin 3 MG TABS Take 3 mg by mouth at bedtime.    [provider]  metFORMIN (GLUCOPHAGE) 500 MG tablet Take 500 mg by mouth 2 (two) times daily.     [provider]  mirtazapine (REMERON) 30 MG tablet Take 30 mg by mouth at bedtime.     [provider]  Neomycin-Bacitracin-Polymyxin (TRIPLE ANTIBIOTIC) [phone] OINT Apply 1 application topically as needed (for wound care until healed).    [provider]  pantoprazole (PROTONIX) 40 MG tablet Take 1 tablet (40 mg total) by mouth daily. 06/22/15   Catarina Hartshorn, MD  phenytoin (DILANTIN) 100 MG ER capsule Take 3 capsules (300 mg total) by mouth daily. Start 06/23/2015 Patient taking differently: Take 200 mg by mouth every morning.  06/23/15   Catarina Hartshorn, MD  potassium chloride SA (K-DUR,KLOR-CON) 20 MEQ tablet Take 20 mEq  by mouth daily.     [provider]  risperiDONE (RISPERDAL) 2 MG tablet Take 2 mg by mouth at bedtime.     [provider]  triamcinolone cream (KENALOG) 0.1 % Apply 1 application topically 2 (two) times daily as needed (for itching).    [provider]    Family History Family History  Problem Relation Age of Onset  . Heart disease Mother   . Alzheimer's disease Father   . Breast cancer Sister   . Colon cancer Unknown        unknown  . Diabetes Sister     Social History Social History  Substance Use Topics  . Smoking status: Former Smoker    Types: Cigars    Quit date: 04/14/1986  . Smokeless tobacco: Never Used     Comment: Quit cigars in the 1980s  . Alcohol use No     Allergies   Patient has no known allergies.   Review of Systems Review of Systems  All other systems reviewed and are negative.    Physical Exam Updated Vital Signs BP 129/72 (BP Location: Left Arm)   Pulse 64   Temp 97.8 F (36.6 C) (Oral)   Resp 18   Ht 1.778 m ( )   Wt 81.6 kg (180 lb)   SpO2 100%   BMI 25.83 kg/m   Physical Exam  Constitutional: He appears well-developed and well-nourished. No distress.  HENT:    Head: Normocephalic and atraumatic.  Right Ear: External ear normal.  Left Ear: External ear normal.  Eyes: Conjunctivae are normal. Right eye exhibits no discharge. Left eye exhibits no discharge. No scleral icterus.  Neck: Neck supple. No tracheal deviation present.  Cardiovascular: Normal rate, regular rhythm and intact distal pulses.   Pulmonary/Chest: Effort normal and breath sounds normal. No stridor. No respiratory distress. He has no wheezes. He has no rales.  Abdominal: Soft. Bowel sounds are normal. He exhibits no distension. There is no tenderness. There is no rebound and no guarding.  Musculoskeletal: He exhibits no edema or tenderness.  Neurological: He is alert. He has normal strength. No cranial nerve deficit (no facial droop, extraocular movements intact, no slurred speech) or sensory deficit. He exhibits normal muscle tone. He displays no seizure activity. Coordination normal.  Skin: Skin is warm and dry. No rash noted.  Psychiatric: He has a normal mood and affect.  Nursing note and vitals reviewed.    ED Treatments / Results    Radiology Ct Head Wo Contrast  Result Date: 08/19/2017 CLINICAL DATA:  Per EMS, patient was walking to the dining room when he fell and hit his head on the side of the nurses station desk. No swelling, bleeding or lac noted. Pt c/o head pain. Hx dementia, follows commands oriented to person and place. EXAM: CT HEAD WITHOUT CONTRAST CT CERVICAL SPINE WITHOUT CONTRAST TECHNIQUE: Multidetector CT imaging of the head and cervical spine was performed following the standard protocol without intravenous contrast. Multiplanar CT image reconstructions of the cervical spine were also generated. COMPARISON:  07/28/2017 FINDINGS: CT HEAD FINDINGS Brain: No acute infarction or intracranial hemorrhage. No parenchymal masses. Ventricular enlargement and anomalies are stable from the prior exam. Vascular: No hyperdense vessel or unexpected calcification. Skull:  Normal. Negative for fracture or focal lesion. Sinuses/Orbits: Globes and orbits are unremarkable. Visualized sinuses and mastoid air cells are clear. Other: None. CT CERVICAL SPINE FINDINGS Alignment: Normal. Skull base and vertebrae: No acute fracture. No primary bone lesion or focal  pathologic process. Soft tissues and spinal canal: No prevertebral fluid or swelling. No visible canal hematoma. Disc levels: Loss of disc height, moderate, from C3-C4 through C6-C7. Endplate spurring at all cervical levels. Facet degenerative changes most evident on the right at C4-C5. No convincing disc herniation. Upper chest: Enlarged thyroid with multiple nodules, stable. No acute findings. Other: None. IMPRESSION: HEAD CT 1. No acute intracranial abnormalities. 2. Stable developmental anomalies. CERVICAL CT 1. No fracture or acute finding. Electronically Signed   By: Amie Portland M.D.   On: 08/19/2017 13:18   Ct Cervical Spine Wo Contrast  Result Date: 08/19/2017 CLINICAL DATA:  Per EMS, patient was walking to the dining room when he fell and hit his head on the side of the nurses station desk. No swelling, bleeding or lac noted. Pt c/o head pain. Hx dementia, follows commands oriented to person and place. EXAM: CT HEAD WITHOUT CONTRAST CT CERVICAL SPINE WITHOUT CONTRAST TECHNIQUE: Multidetector CT imaging of the head and cervical spine was performed following the standard protocol without intravenous contrast. Multiplanar CT image reconstructions of the cervical spine were also generated. COMPARISON:  07/28/2017 FINDINGS: CT HEAD FINDINGS Brain: No acute infarction or intracranial hemorrhage. No parenchymal masses. Ventricular enlargement and anomalies are stable from the prior exam. Vascular: No hyperdense vessel or unexpected calcification. Skull: Normal. Negative for fracture or focal lesion. Sinuses/Orbits: Globes and orbits are unremarkable. Visualized sinuses and mastoid air cells are clear. Other: None. CT CERVICAL  SPINE FINDINGS Alignment: Normal. Skull base and vertebrae: No acute fracture. No primary bone lesion or focal pathologic process. Soft tissues and spinal canal: No prevertebral fluid or swelling. No visible canal hematoma. Disc levels: Loss of disc height, moderate, from C3-C4 through C6-C7. Endplate spurring at all cervical levels. Facet degenerative changes most evident on the right at C4-C5. No convincing disc herniation. Upper chest: Enlarged thyroid with multiple nodules, stable. No acute findings. Other: None. IMPRESSION: HEAD CT 1. No acute intracranial abnormalities. 2. Stable developmental anomalies. CERVICAL CT 1. No fracture or acute finding. Electronically Signed   By: Amie Portland M.D.   On: 08/19/2017 13:18    Procedures Procedures (including critical care time)  Medications Ordered in ED Medications - No data to display   Initial Impression / Assessment and Plan / ED Course  I have reviewed the triage vital signs and the nursing notes.  Pertinent labs & imaging results that were available during my care of the patient were reviewed by me and considered in my medical decision making (see chart for details).   Pt presented to the ED after a fall at the nursing home.  Pt had ?LOC and neck pain.  CT of c spine and head are negative.  No other injuries noted on exam.  At this time there does not appear to be any evidence of an acute emergency medical condition and the patient appears stable for discharge with appropriate outpatient follow up.   Final Clinical Impressions(s) / ED Diagnoses   Final diagnoses:  Fall, initial encounter  Minor head injury, initial encounter    New Prescriptions New Prescriptions   No medications on file     Linwood Dibbles, MD 08/19/17 1341

## 2017-08-19 NOTE — ED Notes (Signed)
Bed: [facility] Expected date:  Expected time:  Means of arrival:  Comments: 71 yo fall; head injury

## 2017-08-20 DIAGNOSIS — I69321 Dysphasia following cerebral infarction: Secondary | ICD-10-CM | POA: Diagnosis not present

## 2017-08-20 DIAGNOSIS — R1312 Dysphagia, oropharyngeal phase: Secondary | ICD-10-CM | POA: Diagnosis not present

## 2017-08-20 DIAGNOSIS — R1319 Other dysphagia: Secondary | ICD-10-CM | POA: Diagnosis not present

## 2017-08-27 DIAGNOSIS — R569 Unspecified convulsions: Secondary | ICD-10-CM | POA: Diagnosis not present

## 2017-08-27 DIAGNOSIS — R945 Abnormal results of liver function studies: Secondary | ICD-10-CM | POA: Diagnosis not present

## 2017-08-27 DIAGNOSIS — F3289 Other specified depressive episodes: Secondary | ICD-10-CM | POA: Diagnosis not present

## 2017-08-27 DIAGNOSIS — R972 Elevated prostate specific antigen [PSA]: Secondary | ICD-10-CM | POA: Diagnosis not present

## 2017-08-27 DIAGNOSIS — I10 Essential (primary) hypertension: Secondary | ICD-10-CM | POA: Diagnosis not present

## 2017-08-27 DIAGNOSIS — E119 Type 2 diabetes mellitus without complications: Secondary | ICD-10-CM | POA: Diagnosis not present

## 2017-08-27 DIAGNOSIS — Z6825 Body mass index (BMI) 25.0-25.9, adult: Secondary | ICD-10-CM | POA: Diagnosis not present

## 2017-08-27 DIAGNOSIS — R21 Rash and other nonspecific skin eruption: Secondary | ICD-10-CM | POA: Diagnosis not present

## 2017-08-27 DIAGNOSIS — Z23 Encounter for immunization: Secondary | ICD-10-CM | POA: Diagnosis not present

## 2017-08-27 DIAGNOSIS — I639 Cerebral infarction, unspecified: Secondary | ICD-10-CM | POA: Diagnosis not present

## 2017-08-29 DIAGNOSIS — I69321 Dysphasia following cerebral infarction: Secondary | ICD-10-CM | POA: Diagnosis not present

## 2017-08-29 DIAGNOSIS — R1312 Dysphagia, oropharyngeal phase: Secondary | ICD-10-CM | POA: Diagnosis not present

## 2017-08-29 DIAGNOSIS — R1319 Other dysphagia: Secondary | ICD-10-CM | POA: Diagnosis not present

## 2017-09-01 DIAGNOSIS — R1312 Dysphagia, oropharyngeal phase: Secondary | ICD-10-CM | POA: Diagnosis not present

## 2017-09-01 DIAGNOSIS — R1319 Other dysphagia: Secondary | ICD-10-CM | POA: Diagnosis not present

## 2017-09-01 DIAGNOSIS — I69321 Dysphasia following cerebral infarction: Secondary | ICD-10-CM | POA: Diagnosis not present

## 2017-09-04 DIAGNOSIS — R1312 Dysphagia, oropharyngeal phase: Secondary | ICD-10-CM | POA: Diagnosis not present

## 2017-09-04 DIAGNOSIS — R1319 Other dysphagia: Secondary | ICD-10-CM | POA: Diagnosis not present

## 2017-09-04 DIAGNOSIS — I69321 Dysphasia following cerebral infarction: Secondary | ICD-10-CM | POA: Diagnosis not present

## 2017-09-14 DIAGNOSIS — I6389 Other cerebral infarction: Secondary | ICD-10-CM | POA: Diagnosis not present

## 2017-09-14 DIAGNOSIS — R296 Repeated falls: Secondary | ICD-10-CM | POA: Diagnosis not present

## 2017-09-14 DIAGNOSIS — R2681 Unsteadiness on feet: Secondary | ICD-10-CM | POA: Diagnosis not present

## 2017-09-14 DIAGNOSIS — R262 Difficulty in walking, not elsewhere classified: Secondary | ICD-10-CM | POA: Diagnosis not present

## 2017-09-14 DIAGNOSIS — R278 Other lack of coordination: Secondary | ICD-10-CM | POA: Diagnosis not present

## 2017-09-14 DIAGNOSIS — M6281 Muscle weakness (generalized): Secondary | ICD-10-CM | POA: Diagnosis not present

## 2017-09-17 DIAGNOSIS — R296 Repeated falls: Secondary | ICD-10-CM | POA: Diagnosis not present

## 2017-09-17 DIAGNOSIS — R2681 Unsteadiness on feet: Secondary | ICD-10-CM | POA: Diagnosis not present

## 2017-09-17 DIAGNOSIS — M6281 Muscle weakness (generalized): Secondary | ICD-10-CM | POA: Diagnosis not present

## 2017-09-17 DIAGNOSIS — R278 Other lack of coordination: Secondary | ICD-10-CM | POA: Diagnosis not present

## 2017-09-17 DIAGNOSIS — R262 Difficulty in walking, not elsewhere classified: Secondary | ICD-10-CM | POA: Diagnosis not present

## 2017-09-18 DIAGNOSIS — R262 Difficulty in walking, not elsewhere classified: Secondary | ICD-10-CM | POA: Diagnosis not present

## 2017-09-18 DIAGNOSIS — M6281 Muscle weakness (generalized): Secondary | ICD-10-CM | POA: Diagnosis not present

## 2017-09-18 DIAGNOSIS — R278 Other lack of coordination: Secondary | ICD-10-CM | POA: Diagnosis not present

## 2017-09-18 DIAGNOSIS — R2681 Unsteadiness on feet: Secondary | ICD-10-CM | POA: Diagnosis not present

## 2017-09-18 DIAGNOSIS — R296 Repeated falls: Secondary | ICD-10-CM | POA: Diagnosis not present

## 2017-09-20 DIAGNOSIS — M6281 Muscle weakness (generalized): Secondary | ICD-10-CM | POA: Diagnosis not present

## 2017-09-20 DIAGNOSIS — R2681 Unsteadiness on feet: Secondary | ICD-10-CM | POA: Diagnosis not present

## 2017-09-20 DIAGNOSIS — R262 Difficulty in walking, not elsewhere classified: Secondary | ICD-10-CM | POA: Diagnosis not present

## 2017-09-20 DIAGNOSIS — R296 Repeated falls: Secondary | ICD-10-CM | POA: Diagnosis not present

## 2017-09-20 DIAGNOSIS — R278 Other lack of coordination: Secondary | ICD-10-CM | POA: Diagnosis not present

## 2017-09-21 DIAGNOSIS — R262 Difficulty in walking, not elsewhere classified: Secondary | ICD-10-CM | POA: Diagnosis not present

## 2017-09-21 DIAGNOSIS — R296 Repeated falls: Secondary | ICD-10-CM | POA: Diagnosis not present

## 2017-09-21 DIAGNOSIS — M6281 Muscle weakness (generalized): Secondary | ICD-10-CM | POA: Diagnosis not present

## 2017-09-21 DIAGNOSIS — R2681 Unsteadiness on feet: Secondary | ICD-10-CM | POA: Diagnosis not present

## 2017-09-21 DIAGNOSIS — R278 Other lack of coordination: Secondary | ICD-10-CM | POA: Diagnosis not present

## 2017-09-25 DIAGNOSIS — R262 Difficulty in walking, not elsewhere classified: Secondary | ICD-10-CM | POA: Diagnosis not present

## 2017-09-25 DIAGNOSIS — R278 Other lack of coordination: Secondary | ICD-10-CM | POA: Diagnosis not present

## 2017-09-25 DIAGNOSIS — M6281 Muscle weakness (generalized): Secondary | ICD-10-CM | POA: Diagnosis not present

## 2017-09-25 DIAGNOSIS — R296 Repeated falls: Secondary | ICD-10-CM | POA: Diagnosis not present

## 2017-09-25 DIAGNOSIS — R2681 Unsteadiness on feet: Secondary | ICD-10-CM | POA: Diagnosis not present

## 2017-09-26 DIAGNOSIS — Z79899 Other long term (current) drug therapy: Secondary | ICD-10-CM | POA: Diagnosis not present

## 2017-09-26 DIAGNOSIS — R262 Difficulty in walking, not elsewhere classified: Secondary | ICD-10-CM | POA: Diagnosis not present

## 2017-09-26 DIAGNOSIS — M6281 Muscle weakness (generalized): Secondary | ICD-10-CM | POA: Diagnosis not present

## 2017-09-26 DIAGNOSIS — R296 Repeated falls: Secondary | ICD-10-CM | POA: Diagnosis not present

## 2017-09-26 DIAGNOSIS — R278 Other lack of coordination: Secondary | ICD-10-CM | POA: Diagnosis not present

## 2017-09-26 DIAGNOSIS — N390 Urinary tract infection, site not specified: Secondary | ICD-10-CM | POA: Diagnosis not present

## 2017-09-26 DIAGNOSIS — R569 Unspecified convulsions: Secondary | ICD-10-CM | POA: Diagnosis not present

## 2017-09-26 DIAGNOSIS — R2681 Unsteadiness on feet: Secondary | ICD-10-CM | POA: Diagnosis not present

## 2017-09-27 DIAGNOSIS — R2681 Unsteadiness on feet: Secondary | ICD-10-CM | POA: Diagnosis not present

## 2017-09-27 DIAGNOSIS — R262 Difficulty in walking, not elsewhere classified: Secondary | ICD-10-CM | POA: Diagnosis not present

## 2017-09-27 DIAGNOSIS — M6281 Muscle weakness (generalized): Secondary | ICD-10-CM | POA: Diagnosis not present

## 2017-09-27 DIAGNOSIS — R296 Repeated falls: Secondary | ICD-10-CM | POA: Diagnosis not present

## 2017-09-27 DIAGNOSIS — R278 Other lack of coordination: Secondary | ICD-10-CM | POA: Diagnosis not present

## 2017-10-01 DIAGNOSIS — M6281 Muscle weakness (generalized): Secondary | ICD-10-CM | POA: Diagnosis not present

## 2017-10-01 DIAGNOSIS — R278 Other lack of coordination: Secondary | ICD-10-CM | POA: Diagnosis not present

## 2017-10-01 DIAGNOSIS — R296 Repeated falls: Secondary | ICD-10-CM | POA: Diagnosis not present

## 2017-10-01 DIAGNOSIS — R2681 Unsteadiness on feet: Secondary | ICD-10-CM | POA: Diagnosis not present

## 2017-10-01 DIAGNOSIS — R262 Difficulty in walking, not elsewhere classified: Secondary | ICD-10-CM | POA: Diagnosis not present

## 2017-10-02 DIAGNOSIS — R278 Other lack of coordination: Secondary | ICD-10-CM | POA: Diagnosis not present

## 2017-10-02 DIAGNOSIS — M6281 Muscle weakness (generalized): Secondary | ICD-10-CM | POA: Diagnosis not present

## 2017-10-02 DIAGNOSIS — R2681 Unsteadiness on feet: Secondary | ICD-10-CM | POA: Diagnosis not present

## 2017-10-02 DIAGNOSIS — R262 Difficulty in walking, not elsewhere classified: Secondary | ICD-10-CM | POA: Diagnosis not present

## 2017-10-02 DIAGNOSIS — R296 Repeated falls: Secondary | ICD-10-CM | POA: Diagnosis not present

## 2017-10-03 DIAGNOSIS — R296 Repeated falls: Secondary | ICD-10-CM | POA: Diagnosis not present

## 2017-10-03 DIAGNOSIS — R2681 Unsteadiness on feet: Secondary | ICD-10-CM | POA: Diagnosis not present

## 2017-10-03 DIAGNOSIS — M6281 Muscle weakness (generalized): Secondary | ICD-10-CM | POA: Diagnosis not present

## 2017-10-03 DIAGNOSIS — R278 Other lack of coordination: Secondary | ICD-10-CM | POA: Diagnosis not present

## 2017-10-03 DIAGNOSIS — R262 Difficulty in walking, not elsewhere classified: Secondary | ICD-10-CM | POA: Diagnosis not present

## 2017-10-05 DIAGNOSIS — R278 Other lack of coordination: Secondary | ICD-10-CM | POA: Diagnosis not present

## 2017-10-05 DIAGNOSIS — R2681 Unsteadiness on feet: Secondary | ICD-10-CM | POA: Diagnosis not present

## 2017-10-05 DIAGNOSIS — R296 Repeated falls: Secondary | ICD-10-CM | POA: Diagnosis not present

## 2017-10-05 DIAGNOSIS — M6281 Muscle weakness (generalized): Secondary | ICD-10-CM | POA: Diagnosis not present

## 2017-10-05 DIAGNOSIS — R262 Difficulty in walking, not elsewhere classified: Secondary | ICD-10-CM | POA: Diagnosis not present

## 2017-10-08 DIAGNOSIS — R296 Repeated falls: Secondary | ICD-10-CM | POA: Diagnosis not present

## 2017-10-08 DIAGNOSIS — R278 Other lack of coordination: Secondary | ICD-10-CM | POA: Diagnosis not present

## 2017-10-08 DIAGNOSIS — M6281 Muscle weakness (generalized): Secondary | ICD-10-CM | POA: Diagnosis not present

## 2017-10-08 DIAGNOSIS — R2681 Unsteadiness on feet: Secondary | ICD-10-CM | POA: Diagnosis not present

## 2017-10-08 DIAGNOSIS — R262 Difficulty in walking, not elsewhere classified: Secondary | ICD-10-CM | POA: Diagnosis not present

## 2017-10-09 DIAGNOSIS — Z1389 Encounter for screening for other disorder: Secondary | ICD-10-CM | POA: Diagnosis not present

## 2017-10-09 DIAGNOSIS — E119 Type 2 diabetes mellitus without complications: Secondary | ICD-10-CM | POA: Diagnosis not present

## 2017-10-09 DIAGNOSIS — R21 Rash and other nonspecific skin eruption: Secondary | ICD-10-CM | POA: Diagnosis not present

## 2017-10-09 DIAGNOSIS — R569 Unspecified convulsions: Secondary | ICD-10-CM | POA: Diagnosis not present

## 2017-10-09 DIAGNOSIS — M6281 Muscle weakness (generalized): Secondary | ICD-10-CM | POA: Diagnosis not present

## 2017-10-09 DIAGNOSIS — F3289 Other specified depressive episodes: Secondary | ICD-10-CM | POA: Diagnosis not present

## 2017-10-09 DIAGNOSIS — R278 Other lack of coordination: Secondary | ICD-10-CM | POA: Diagnosis not present

## 2017-10-09 DIAGNOSIS — R296 Repeated falls: Secondary | ICD-10-CM | POA: Diagnosis not present

## 2017-10-09 DIAGNOSIS — Z6825 Body mass index (BMI) 25.0-25.9, adult: Secondary | ICD-10-CM | POA: Diagnosis not present

## 2017-10-09 DIAGNOSIS — R945 Abnormal results of liver function studies: Secondary | ICD-10-CM | POA: Diagnosis not present

## 2017-10-09 DIAGNOSIS — R2681 Unsteadiness on feet: Secondary | ICD-10-CM | POA: Diagnosis not present

## 2017-10-09 DIAGNOSIS — R262 Difficulty in walking, not elsewhere classified: Secondary | ICD-10-CM | POA: Diagnosis not present

## 2017-10-09 DIAGNOSIS — I6389 Other cerebral infarction: Secondary | ICD-10-CM | POA: Diagnosis not present

## 2017-10-09 DIAGNOSIS — I10 Essential (primary) hypertension: Secondary | ICD-10-CM | POA: Diagnosis not present

## 2017-10-10 DIAGNOSIS — M6281 Muscle weakness (generalized): Secondary | ICD-10-CM | POA: Diagnosis not present

## 2017-10-10 DIAGNOSIS — R278 Other lack of coordination: Secondary | ICD-10-CM | POA: Diagnosis not present

## 2017-10-10 DIAGNOSIS — R262 Difficulty in walking, not elsewhere classified: Secondary | ICD-10-CM | POA: Diagnosis not present

## 2017-10-10 DIAGNOSIS — R296 Repeated falls: Secondary | ICD-10-CM | POA: Diagnosis not present

## 2017-10-10 DIAGNOSIS — R2681 Unsteadiness on feet: Secondary | ICD-10-CM | POA: Diagnosis not present

## 2017-10-11 DIAGNOSIS — R262 Difficulty in walking, not elsewhere classified: Secondary | ICD-10-CM | POA: Diagnosis not present

## 2017-10-11 DIAGNOSIS — R278 Other lack of coordination: Secondary | ICD-10-CM | POA: Diagnosis not present

## 2017-10-11 DIAGNOSIS — M79675 Pain in left toe(s): Secondary | ICD-10-CM | POA: Diagnosis not present

## 2017-10-11 DIAGNOSIS — B351 Tinea unguium: Secondary | ICD-10-CM | POA: Diagnosis not present

## 2017-10-11 DIAGNOSIS — M79674 Pain in right toe(s): Secondary | ICD-10-CM | POA: Diagnosis not present

## 2017-10-11 DIAGNOSIS — R2681 Unsteadiness on feet: Secondary | ICD-10-CM | POA: Diagnosis not present

## 2017-10-11 DIAGNOSIS — R296 Repeated falls: Secondary | ICD-10-CM | POA: Diagnosis not present

## 2017-10-11 DIAGNOSIS — M6281 Muscle weakness (generalized): Secondary | ICD-10-CM | POA: Diagnosis not present

## 2017-10-12 DIAGNOSIS — R262 Difficulty in walking, not elsewhere classified: Secondary | ICD-10-CM | POA: Diagnosis not present

## 2017-10-12 DIAGNOSIS — R296 Repeated falls: Secondary | ICD-10-CM | POA: Diagnosis not present

## 2017-10-12 DIAGNOSIS — R2681 Unsteadiness on feet: Secondary | ICD-10-CM | POA: Diagnosis not present

## 2017-10-12 DIAGNOSIS — R278 Other lack of coordination: Secondary | ICD-10-CM | POA: Diagnosis not present

## 2017-10-12 DIAGNOSIS — M6281 Muscle weakness (generalized): Secondary | ICD-10-CM | POA: Diagnosis not present

## 2017-10-15 DIAGNOSIS — R278 Other lack of coordination: Secondary | ICD-10-CM | POA: Diagnosis not present

## 2017-10-15 DIAGNOSIS — R2681 Unsteadiness on feet: Secondary | ICD-10-CM | POA: Diagnosis not present

## 2017-10-15 DIAGNOSIS — R262 Difficulty in walking, not elsewhere classified: Secondary | ICD-10-CM | POA: Diagnosis not present

## 2017-10-15 DIAGNOSIS — R296 Repeated falls: Secondary | ICD-10-CM | POA: Diagnosis not present

## 2017-10-15 DIAGNOSIS — M6281 Muscle weakness (generalized): Secondary | ICD-10-CM | POA: Diagnosis not present

## 2017-10-16 DIAGNOSIS — R262 Difficulty in walking, not elsewhere classified: Secondary | ICD-10-CM | POA: Diagnosis not present

## 2017-10-16 DIAGNOSIS — R278 Other lack of coordination: Secondary | ICD-10-CM | POA: Diagnosis not present

## 2017-10-16 DIAGNOSIS — M6281 Muscle weakness (generalized): Secondary | ICD-10-CM | POA: Diagnosis not present

## 2017-10-16 DIAGNOSIS — R296 Repeated falls: Secondary | ICD-10-CM | POA: Diagnosis not present

## 2017-10-16 DIAGNOSIS — R2681 Unsteadiness on feet: Secondary | ICD-10-CM | POA: Diagnosis not present

## 2017-10-18 DIAGNOSIS — R2681 Unsteadiness on feet: Secondary | ICD-10-CM | POA: Diagnosis not present

## 2017-10-18 DIAGNOSIS — M6281 Muscle weakness (generalized): Secondary | ICD-10-CM | POA: Diagnosis not present

## 2017-10-18 DIAGNOSIS — R262 Difficulty in walking, not elsewhere classified: Secondary | ICD-10-CM | POA: Diagnosis not present

## 2017-10-18 DIAGNOSIS — R278 Other lack of coordination: Secondary | ICD-10-CM | POA: Diagnosis not present

## 2017-10-18 DIAGNOSIS — R296 Repeated falls: Secondary | ICD-10-CM | POA: Diagnosis not present

## 2017-10-23 ENCOUNTER — Inpatient Hospital Stay (HOSPITAL_COMMUNITY): Payer: Medicare Other | Admitting: Certified Registered"

## 2017-10-23 ENCOUNTER — Inpatient Hospital Stay (HOSPITAL_COMMUNITY): Payer: Medicare Other

## 2017-10-23 ENCOUNTER — Encounter (HOSPITAL_COMMUNITY)
Admission: EM | Disposition: A | Discharge status: Skilled Nursing Facility | Payer: Self-pay | Source: Home / Self Care | Attending: Internal Medicine

## 2017-10-23 ENCOUNTER — Inpatient Hospital Stay (HOSPITAL_COMMUNITY)
Admission: EM | Admit: 2017-10-23 | Discharge: 2017-10-27 | DRG: 470 | Disposition: A | Discharge status: Skilled Nursing Facility | Payer: Medicare Other | Attending: Internal Medicine | Admitting: Internal Medicine

## 2017-10-23 ENCOUNTER — Other Ambulatory Visit: Payer: Self-pay

## 2017-10-23 ENCOUNTER — Emergency Department (HOSPITAL_COMMUNITY): Payer: Medicare Other

## 2017-10-23 ENCOUNTER — Encounter (HOSPITAL_COMMUNITY): Payer: Self-pay | Admitting: Emergency Medicine

## 2017-10-23 DIAGNOSIS — Z96641 Presence of right artificial hip joint: Secondary | ICD-10-CM

## 2017-10-23 DIAGNOSIS — S72011A Unspecified intracapsular fracture of right femur, initial encounter for closed fracture: Secondary | ICD-10-CM | POA: Diagnosis present

## 2017-10-23 DIAGNOSIS — G4733 Obstructive sleep apnea (adult) (pediatric): Secondary | ICD-10-CM | POA: Diagnosis present

## 2017-10-23 DIAGNOSIS — E785 Hyperlipidemia, unspecified: Secondary | ICD-10-CM | POA: Diagnosis present

## 2017-10-23 DIAGNOSIS — E1159 Type 2 diabetes mellitus with other circulatory complications: Secondary | ICD-10-CM | POA: Diagnosis not present

## 2017-10-23 DIAGNOSIS — Z8673 Personal history of transient ischemic attack (TIA), and cerebral infarction without residual deficits: Secondary | ICD-10-CM | POA: Diagnosis not present

## 2017-10-23 DIAGNOSIS — F329 Major depressive disorder, single episode, unspecified: Secondary | ICD-10-CM | POA: Diagnosis present

## 2017-10-23 DIAGNOSIS — Z820 Family history of epilepsy and other diseases of the nervous system: Secondary | ICD-10-CM | POA: Diagnosis not present

## 2017-10-23 DIAGNOSIS — Z79899 Other long term (current) drug therapy: Secondary | ICD-10-CM | POA: Diagnosis not present

## 2017-10-23 DIAGNOSIS — Z833 Family history of diabetes mellitus: Secondary | ICD-10-CM | POA: Diagnosis not present

## 2017-10-23 DIAGNOSIS — Z111 Encounter for screening for respiratory tuberculosis: Secondary | ICD-10-CM | POA: Diagnosis not present

## 2017-10-23 DIAGNOSIS — R9431 Abnormal electrocardiogram [ECG] [EKG]: Secondary | ICD-10-CM | POA: Diagnosis not present

## 2017-10-23 DIAGNOSIS — G8911 Acute pain due to trauma: Secondary | ICD-10-CM | POA: Diagnosis not present

## 2017-10-23 DIAGNOSIS — G40909 Epilepsy, unspecified, not intractable, without status epilepticus: Secondary | ICD-10-CM | POA: Diagnosis present

## 2017-10-23 DIAGNOSIS — S72041A Displaced fracture of base of neck of right femur, initial encounter for closed fracture: Secondary | ICD-10-CM | POA: Diagnosis not present

## 2017-10-23 DIAGNOSIS — G40409 Other generalized epilepsy and epileptic syndromes, not intractable, without status epilepticus: Secondary | ICD-10-CM | POA: Diagnosis not present

## 2017-10-23 DIAGNOSIS — Z87891 Personal history of nicotine dependence: Secondary | ICD-10-CM | POA: Diagnosis not present

## 2017-10-23 DIAGNOSIS — E876 Hypokalemia: Secondary | ICD-10-CM | POA: Diagnosis present

## 2017-10-23 DIAGNOSIS — I2510 Atherosclerotic heart disease of native coronary artery without angina pectoris: Secondary | ICD-10-CM | POA: Diagnosis present

## 2017-10-23 DIAGNOSIS — S72009A Fracture of unspecified part of neck of unspecified femur, initial encounter for closed fracture: Secondary | ICD-10-CM | POA: Diagnosis not present

## 2017-10-23 DIAGNOSIS — R892 Abnormal level of other drugs, medicaments and biological substances in specimens from other organs, systems and tissues: Secondary | ICD-10-CM | POA: Diagnosis not present

## 2017-10-23 DIAGNOSIS — S72001A Fracture of unspecified part of neck of right femur, initial encounter for closed fracture: Secondary | ICD-10-CM | POA: Diagnosis not present

## 2017-10-23 DIAGNOSIS — R52 Pain, unspecified: Secondary | ICD-10-CM | POA: Diagnosis not present

## 2017-10-23 DIAGNOSIS — Z794 Long term (current) use of insulin: Secondary | ICD-10-CM

## 2017-10-23 DIAGNOSIS — Z803 Family history of malignant neoplasm of breast: Secondary | ICD-10-CM | POA: Diagnosis not present

## 2017-10-23 DIAGNOSIS — E049 Nontoxic goiter, unspecified: Secondary | ICD-10-CM | POA: Diagnosis present

## 2017-10-23 DIAGNOSIS — W010XXA Fall on same level from slipping, tripping and stumbling without subsequent striking against object, initial encounter: Secondary | ICD-10-CM | POA: Diagnosis present

## 2017-10-23 DIAGNOSIS — Z8249 Family history of ischemic heart disease and other diseases of the circulatory system: Secondary | ICD-10-CM | POA: Diagnosis not present

## 2017-10-23 DIAGNOSIS — D62 Acute posthemorrhagic anemia: Secondary | ICD-10-CM | POA: Diagnosis not present

## 2017-10-23 DIAGNOSIS — Z7982 Long term (current) use of aspirin: Secondary | ICD-10-CM | POA: Diagnosis not present

## 2017-10-23 DIAGNOSIS — I10 Essential (primary) hypertension: Secondary | ICD-10-CM | POA: Diagnosis present

## 2017-10-23 DIAGNOSIS — E569 Vitamin deficiency, unspecified: Secondary | ICD-10-CM | POA: Diagnosis not present

## 2017-10-23 DIAGNOSIS — Z471 Aftercare following joint replacement surgery: Secondary | ICD-10-CM | POA: Diagnosis not present

## 2017-10-23 DIAGNOSIS — R278 Other lack of coordination: Secondary | ICD-10-CM | POA: Diagnosis not present

## 2017-10-23 DIAGNOSIS — K219 Gastro-esophageal reflux disease without esophagitis: Secondary | ICD-10-CM | POA: Diagnosis present

## 2017-10-23 DIAGNOSIS — D696 Thrombocytopenia, unspecified: Secondary | ICD-10-CM | POA: Diagnosis present

## 2017-10-23 DIAGNOSIS — F0391 Unspecified dementia with behavioral disturbance: Secondary | ICD-10-CM | POA: Diagnosis not present

## 2017-10-23 DIAGNOSIS — T148XXA Other injury of unspecified body region, initial encounter: Secondary | ICD-10-CM | POA: Diagnosis not present

## 2017-10-23 DIAGNOSIS — E119 Type 2 diabetes mellitus without complications: Secondary | ICD-10-CM | POA: Diagnosis not present

## 2017-10-23 DIAGNOSIS — Z9181 History of falling: Secondary | ICD-10-CM | POA: Diagnosis not present

## 2017-10-23 DIAGNOSIS — Z419 Encounter for procedure for purposes other than remedying health state, unspecified: Secondary | ICD-10-CM

## 2017-10-23 DIAGNOSIS — S72011D Unspecified intracapsular fracture of right femur, subsequent encounter for closed fracture with routine healing: Secondary | ICD-10-CM | POA: Diagnosis not present

## 2017-10-23 DIAGNOSIS — K5900 Constipation, unspecified: Secondary | ICD-10-CM | POA: Diagnosis not present

## 2017-10-23 DIAGNOSIS — G4700 Insomnia, unspecified: Secondary | ICD-10-CM | POA: Diagnosis not present

## 2017-10-23 DIAGNOSIS — G40911 Epilepsy, unspecified, intractable, with status epilepticus: Secondary | ICD-10-CM | POA: Diagnosis not present

## 2017-10-23 DIAGNOSIS — K922 Gastrointestinal hemorrhage, unspecified: Secondary | ICD-10-CM | POA: Diagnosis not present

## 2017-10-23 DIAGNOSIS — D649 Anemia, unspecified: Secondary | ICD-10-CM | POA: Diagnosis not present

## 2017-10-23 DIAGNOSIS — M25551 Pain in right hip: Secondary | ICD-10-CM | POA: Diagnosis not present

## 2017-10-23 DIAGNOSIS — M6281 Muscle weakness (generalized): Secondary | ICD-10-CM | POA: Diagnosis not present

## 2017-10-23 HISTORY — PX: TOTAL HIP ARTHROPLASTY: SHX124

## 2017-10-23 LAB — CBC WITH DIFFERENTIAL/PLATELET
BASOS PCT: 0 %
Basophils Absolute: 0 10*3/uL (ref 0.0–0.1)
Eosinophils Absolute: 0.7 10*3/uL (ref 0.0–0.7)
Eosinophils Relative: 6 %
HEMATOCRIT: 36.3 % — AB (ref 39.0–52.0)
HEMOGLOBIN: 12.5 g/dL — AB (ref 13.0–17.0)
LYMPHS PCT: 17 %
Lymphs Abs: 1.9 10*3/uL (ref 0.7–4.0)
MCH: 31.9 pg (ref 26.0–34.0)
MCHC: 34.4 g/dL (ref 30.0–36.0)
MCV: 92.6 fL (ref 78.0–100.0)
MONOS PCT: 7 %
Monocytes Absolute: 0.8 10*3/uL (ref 0.1–1.0)
NEUTROS ABS: 7.9 10*3/uL — AB (ref 1.7–7.7)
NEUTROS PCT: 70 %
Platelets: 122 10*3/uL — ABNORMAL LOW (ref 150–400)
RBC: 3.92 MIL/uL — ABNORMAL LOW (ref 4.22–5.81)
RDW: 13.2 % (ref 11.5–15.5)
WBC: 11.3 10*3/uL — ABNORMAL HIGH (ref 4.0–10.5)

## 2017-10-23 LAB — BASIC METABOLIC PANEL
ANION GAP: 9 (ref 5–15)
BUN: 30 mg/dL — ABNORMAL HIGH (ref 6–20)
CALCIUM: 9.2 mg/dL (ref 8.9–10.3)
CHLORIDE: 102 mmol/L (ref 101–111)
CO2: 30 mmol/L (ref 22–32)
Creatinine, Ser: 1.12 mg/dL (ref 0.61–1.24)
GFR calc non Af Amer: >60 mL/min (ref >60)
Glucose, Bld: 141 mg/dL — ABNORMAL HIGH (ref 65–99)
POTASSIUM: 4.1 mmol/L (ref 3.5–5.1)
Sodium: 141 mmol/L (ref 135–145)

## 2017-10-23 LAB — ABO/RH
ABO/RH(D): A POS
ABO/RH(D): A POS

## 2017-10-23 LAB — PROTIME-INR
INR: 1.1
Prothrombin Time: 14.1 seconds (ref 11.4–15.2)

## 2017-10-23 LAB — TYPE AND SCREEN
ABO/RH(D): A POS
Antibody Screen: NEGATIVE

## 2017-10-23 LAB — PHENYTOIN LEVEL, TOTAL: PHENYTOIN LVL: 23.3 ug/mL — AB (ref 10.0–20.0)

## 2017-10-23 LAB — GLUCOSE, CAPILLARY
GLUCOSE-CAPILLARY: 137 mg/dL — AB (ref 65–99)
Glucose-Capillary: 148 mg/dL — ABNORMAL HIGH (ref 65–99)
Glucose-Capillary: 178 mg/dL — ABNORMAL HIGH (ref 65–99)

## 2017-10-23 LAB — CBG MONITORING, ED: GLUCOSE-CAPILLARY: 173 mg/dL — AB (ref 65–99)

## 2017-10-23 LAB — MRSA PCR SCREENING: MRSA by PCR: NEGATIVE

## 2017-10-23 SURGERY — ARTHROPLASTY, HIP, TOTAL, ANTERIOR APPROACH
Anesthesia: Spinal | Laterality: Right

## 2017-10-23 MED ORDER — SODIUM CHLORIDE 0.9 % IR SOLN
Status: DC | PRN
  Administered 2017-10-23: 3000 mL

## 2017-10-23 MED ORDER — 0.9 % SODIUM CHLORIDE (POUR BTL) OPTIME
TOPICAL | Status: DC | PRN
  Administered 2017-10-23: 1000 mL

## 2017-10-23 MED ORDER — PHENYLEPHRINE HCL 10 MG/ML IJ SOLN
INTRAMUSCULAR | Status: DC | PRN
  Administered 2017-10-23: 160 ug via INTRAVENOUS
  Administered 2017-10-23: 240 ug via INTRAVENOUS
  Administered 2017-10-23: 160 ug via INTRAVENOUS

## 2017-10-23 MED ORDER — ROCURONIUM BROMIDE 10 MG/ML (PF) SYRINGE
PREFILLED_SYRINGE | INTRAVENOUS | Status: AC
  Filled 2017-10-23: qty 5

## 2017-10-23 MED ORDER — SODIUM CHLORIDE 0.9 % IV SOLN
INTRAVENOUS | Status: DC
  Administered 2017-10-23: 14:00:00 via INTRAVENOUS

## 2017-10-23 MED ORDER — FENTANYL CITRATE (PF) 100 MCG/2ML IJ SOLN: 25.0000 ug | INTRAMUSCULAR | Status: DC | PRN

## 2017-10-23 MED ORDER — METHOCARBAMOL 500 MG PO TABS
500.0000 mg | ORAL_TABLET | Freq: Four times a day (QID) | ORAL | Status: DC | PRN
  Administered 2017-10-24 – 2017-10-25 (×5): 500 mg via ORAL
  Filled 2017-10-23 (×5): qty 1

## 2017-10-23 MED ORDER — BUPIVACAINE IN DEXTROSE 0.75-8.25 % IT SOLN
INTRATHECAL | Status: DC | PRN
  Administered 2017-10-23: 2 mL via INTRATHECAL

## 2017-10-23 MED ORDER — FENTANYL CITRATE (PF) 250 MCG/5ML IJ SOLN
INTRAMUSCULAR | Status: AC
  Filled 2017-10-23: qty 5

## 2017-10-23 MED ORDER — PROPOFOL 500 MG/50ML IV EMUL
INTRAVENOUS | Status: DC | PRN
  Administered 2017-10-23: 75 ug/kg/min via INTRAVENOUS

## 2017-10-23 MED ORDER — LIDOCAINE HCL (CARDIAC) 20 MG/ML IV SOLN
INTRAVENOUS | Status: DC | PRN
  Administered 2017-10-23: 100 mg via INTRAVENOUS

## 2017-10-23 MED ORDER — ALBUMIN HUMAN 5 % IV SOLN
INTRAVENOUS | Status: DC | PRN
  Administered 2017-10-23: 12:00:00 via INTRAVENOUS

## 2017-10-23 MED ORDER — ACETAMINOPHEN 650 MG RE SUPP: 650.0000 mg | Freq: Four times a day (QID) | RECTAL | Status: DC | PRN

## 2017-10-23 MED ORDER — ACETAMINOPHEN 325 MG PO TABS
650.0000 mg | ORAL_TABLET | Freq: Four times a day (QID) | ORAL | Status: DC | PRN
  Administered 2017-10-26: 650 mg via ORAL
  Filled 2017-10-23: qty 2

## 2017-10-23 MED ORDER — LACTATED RINGERS IV SOLN: INTRAVENOUS | Status: DC

## 2017-10-23 MED ORDER — POVIDONE-IODINE 10 % EX SWAB: 2.0000 "application " | Freq: Once | CUTANEOUS | Status: DC

## 2017-10-23 MED ORDER — EPHEDRINE 5 MG/ML INJ
INTRAVENOUS | Status: AC
  Filled 2017-10-23: qty 10

## 2017-10-23 MED ORDER — MENTHOL 3 MG MT LOZG: 1.0000 | LOZENGE | OROMUCOSAL | Status: DC | PRN

## 2017-10-23 MED ORDER — LORAZEPAM 2 MG/ML IJ SOLN
0.5000 mg | Freq: Once | INTRAMUSCULAR | Status: AC
  Administered 2017-10-23: 0.5 mg via INTRAVENOUS
  Filled 2017-10-23: qty 1

## 2017-10-23 MED ORDER — CEFAZOLIN SODIUM-DEXTROSE 2-4 GM/100ML-% IV SOLN
2.0000 g | Freq: Four times a day (QID) | INTRAVENOUS | Status: AC
  Administered 2017-10-23 (×2): 2 g via INTRAVENOUS
  Filled 2017-10-23 (×2): qty 100

## 2017-10-23 MED ORDER — SODIUM CHLORIDE 0.9 % IV SOLN
500.0000 mg | Freq: Two times a day (BID) | INTRAVENOUS | Status: DC
  Administered 2017-10-23 – 2017-10-25 (×4): 500 mg via INTRAVENOUS
  Filled 2017-10-23 (×6): qty 5

## 2017-10-23 MED ORDER — ONDANSETRON HCL 4 MG/2ML IJ SOLN: 4.0000 mg | Freq: Four times a day (QID) | INTRAMUSCULAR | Status: DC | PRN

## 2017-10-23 MED ORDER — EPHEDRINE SULFATE 50 MG/ML IJ SOLN
INTRAMUSCULAR | Status: DC | PRN
  Administered 2017-10-23: 15 mg via INTRAVENOUS
  Administered 2017-10-23 (×3): 10 mg via INTRAVENOUS

## 2017-10-23 MED ORDER — LIDOCAINE 2% (20 MG/ML) 5 ML SYRINGE
INTRAMUSCULAR | Status: AC
  Filled 2017-10-23: qty 5

## 2017-10-23 MED ORDER — HYDROCODONE-ACETAMINOPHEN 5-325 MG PO TABS
1.0000 | ORAL_TABLET | Freq: Four times a day (QID) | ORAL | Status: DC | PRN
  Administered 2017-10-24 – 2017-10-25 (×3): 2 via ORAL
  Administered 2017-10-26 – 2017-10-27 (×2): 1 via ORAL
  Filled 2017-10-23 (×2): qty 2
  Filled 2017-10-23: qty 1
  Filled 2017-10-23 (×2): qty 2

## 2017-10-23 MED ORDER — PHENOL 1.4 % MT LIQD: 1.0000 | OROMUCOSAL | Status: DC | PRN

## 2017-10-23 MED ORDER — CEFAZOLIN SODIUM-DEXTROSE 2-3 GM-%(50ML) IV SOLR
INTRAVENOUS | Status: DC | PRN
  Administered 2017-10-23: 2 g via INTRAVENOUS

## 2017-10-23 MED ORDER — INSULIN ASPART 100 UNIT/ML ~~LOC~~ SOLN
0.0000 [IU] | SUBCUTANEOUS | Status: DC
  Administered 2017-10-23 – 2017-10-24 (×4): 2 [IU] via SUBCUTANEOUS

## 2017-10-23 MED ORDER — CHLORHEXIDINE GLUCONATE 4 % EX LIQD: 60.0000 mL | Freq: Once | CUTANEOUS | Status: DC

## 2017-10-23 MED ORDER — METOCLOPRAMIDE HCL 5 MG/ML IJ SOLN: 5.0000 mg | Freq: Three times a day (TID) | INTRAMUSCULAR | Status: DC | PRN

## 2017-10-23 MED ORDER — ASPIRIN EC 325 MG PO TBEC
325.0000 mg | DELAYED_RELEASE_TABLET | Freq: Every day | ORAL | Status: DC
  Administered 2017-10-24 – 2017-10-27 (×4): 325 mg via ORAL
  Filled 2017-10-23 (×4): qty 1

## 2017-10-23 MED ORDER — METOCLOPRAMIDE HCL 5 MG PO TABS: 5.0000 mg | ORAL_TABLET | Freq: Three times a day (TID) | ORAL | Status: DC | PRN

## 2017-10-23 MED ORDER — PROPOFOL 10 MG/ML IV BOLUS
INTRAVENOUS | Status: DC | PRN
  Administered 2017-10-23: 40 mg via INTRAVENOUS

## 2017-10-23 MED ORDER — ONDANSETRON HCL 4 MG PO TABS: 4.0000 mg | ORAL_TABLET | Freq: Four times a day (QID) | ORAL | Status: DC | PRN

## 2017-10-23 MED ORDER — SODIUM CHLORIDE 0.9 % IV SOLN: 200.0000 mg | Freq: Every day | INTRAVENOUS | Status: DC

## 2017-10-23 MED ORDER — CEFAZOLIN SODIUM-DEXTROSE 2-4 GM/100ML-% IV SOLN
2.0000 g | INTRAVENOUS | Status: DC
  Filled 2017-10-23 (×2): qty 100

## 2017-10-23 MED ORDER — METOPROLOL TARTRATE 5 MG/5ML IV SOLN
2.5000 mg | Freq: Two times a day (BID) | INTRAVENOUS | Status: DC
  Administered 2017-10-23 – 2017-10-24 (×2): 2.5 mg via INTRAVENOUS
  Filled 2017-10-23 (×2): qty 5

## 2017-10-23 MED ORDER — METHOCARBAMOL 1000 MG/10ML IJ SOLN: 500.0000 mg | Freq: Four times a day (QID) | INTRAMUSCULAR | Status: DC | PRN

## 2017-10-23 MED ORDER — DOCUSATE SODIUM 100 MG PO CAPS
100.0000 mg | ORAL_CAPSULE | Freq: Two times a day (BID) | ORAL | Status: DC
  Administered 2017-10-23 – 2017-10-27 (×7): 100 mg via ORAL
  Filled 2017-10-23 (×8): qty 1

## 2017-10-23 MED ORDER — MORPHINE SULFATE (PF) 2 MG/ML IV SOLN
0.5000 mg | INTRAVENOUS | Status: DC | PRN
  Administered 2017-10-23 – 2017-10-24 (×3): 0.5 mg via INTRAVENOUS
  Filled 2017-10-23: qty 1

## 2017-10-23 MED ORDER — ONDANSETRON HCL 4 MG/2ML IJ SOLN: 4.0000 mg | Freq: Once | INTRAMUSCULAR | Status: DC | PRN

## 2017-10-23 MED ORDER — HYDRALAZINE HCL 20 MG/ML IJ SOLN: 10.0000 mg | INTRAMUSCULAR | Status: DC | PRN

## 2017-10-23 MED ORDER — MORPHINE SULFATE (PF) 2 MG/ML IV SOLN
0.5000 mg | INTRAVENOUS | Status: DC | PRN
  Administered 2017-10-24 – 2017-10-25 (×3): 0.5 mg via INTRAVENOUS
  Filled 2017-10-23 (×5): qty 1

## 2017-10-23 MED ORDER — PHENYLEPHRINE 40 MCG/ML (10ML) SYRINGE FOR IV PUSH (FOR BLOOD PRESSURE SUPPORT)
PREFILLED_SYRINGE | INTRAVENOUS | Status: AC
  Filled 2017-10-23: qty 10

## 2017-10-23 MED ORDER — LACTATED RINGERS IV SOLN
INTRAVENOUS | Status: DC | PRN
  Administered 2017-10-23 (×2): via INTRAVENOUS

## 2017-10-23 SURGICAL SUPPLY — 54 items
APL SKNCLS STERI-STRIP NONHPOA (GAUZE/BANDAGES/DRESSINGS) ×1
BENZOIN TINCTURE PRP APPL 2/3 (GAUZE/BANDAGES/DRESSINGS) ×2 IMPLANT
BLADE CLIPPER SURG (BLADE) IMPLANT
BLADE SAW SGTL 18X1.27X75 (BLADE) ×2 IMPLANT
CAPT HIP TOTAL 2 ×1 IMPLANT
CELLS DAT CNTRL 66122 CELL SVR (MISCELLANEOUS) ×1 IMPLANT
COVER SURGICAL LIGHT HANDLE (MISCELLANEOUS) ×2 IMPLANT
DRAPE C-ARM 42X72 X-RAY (DRAPES) ×2 IMPLANT
DRAPE STERI IOBAN 125X83 (DRAPES) ×2 IMPLANT
DRAPE U-SHAPE 47X51 STRL (DRAPES) ×6 IMPLANT
DRSG AQUACEL AG ADV 3.5X10 (GAUZE/BANDAGES/DRESSINGS) ×2 IMPLANT
DURAPREP 26ML APPLICATOR (WOUND CARE) ×2 IMPLANT
ELECT BLADE 4.0 EZ CLEAN MEGAD (MISCELLANEOUS) ×2
ELECT BLADE 6.5 EXT (BLADE) ×1 IMPLANT
ELECT REM PT RETURN 9FT ADLT (ELECTROSURGICAL) ×2
ELECTRODE BLDE 4.0 EZ CLN MEGD (MISCELLANEOUS) ×1 IMPLANT
ELECTRODE REM PT RTRN 9FT ADLT (ELECTROSURGICAL) ×1 IMPLANT
FACESHIELD WRAPAROUND (MASK) ×6 IMPLANT
FACESHIELD WRAPAROUND OR TEAM (MASK) ×2 IMPLANT
GAUZE XEROFORM 1X8 LF (GAUZE/BANDAGES/DRESSINGS) ×1 IMPLANT
GLOVE BIOGEL PI IND STRL 8 (GLOVE) ×2 IMPLANT
GLOVE BIOGEL PI INDICATOR 8 (GLOVE) ×2
GLOVE ECLIPSE 8.0 STRL XLNG CF (GLOVE) ×2 IMPLANT
GLOVE ORTHO TXT STRL SZ7.5 (GLOVE) ×4 IMPLANT
GOWN STRL REUS W/ TWL LRG LVL3 (GOWN DISPOSABLE) ×2 IMPLANT
GOWN STRL REUS W/ TWL XL LVL3 (GOWN DISPOSABLE) ×2 IMPLANT
GOWN STRL REUS W/TWL LRG LVL3 (GOWN DISPOSABLE) ×4
GOWN STRL REUS W/TWL XL LVL3 (GOWN DISPOSABLE) ×4
HANDPIECE INTERPULSE COAX TIP (DISPOSABLE) ×2
KIT BASIN OR (CUSTOM PROCEDURE TRAY) ×2 IMPLANT
KIT ROOM TURNOVER OR (KITS) ×2 IMPLANT
MANIFOLD NEPTUNE II (INSTRUMENTS) ×2 IMPLANT
NS IRRIG 1000ML POUR BTL (IV SOLUTION) ×2 IMPLANT
PACK TOTAL JOINT (CUSTOM PROCEDURE TRAY) ×2 IMPLANT
PAD ARMBOARD 7.5X6 YLW CONV (MISCELLANEOUS) ×2 IMPLANT
RETRACTOR WND ALEXIS 18 MED (MISCELLANEOUS) ×1 IMPLANT
RTRCTR WOUND ALEXIS 18CM MED (MISCELLANEOUS) ×2
SET HNDPC FAN SPRY TIP SCT (DISPOSABLE) ×1 IMPLANT
SPONGE LAP 18X18 X RAY DECT (DISPOSABLE) ×1 IMPLANT
STAPLER VISISTAT 35W (STAPLE) ×1 IMPLANT
STRIP CLOSURE SKIN 1/2X4 (GAUZE/BANDAGES/DRESSINGS) ×4 IMPLANT
SUT ETHIBOND NAB CT1 #1 30IN (SUTURE) ×2 IMPLANT
SUT MNCRL AB 4-0 PS2 18 (SUTURE) IMPLANT
SUT VIC AB 0 CT1 27 (SUTURE) ×2
SUT VIC AB 0 CT1 27XBRD ANBCTR (SUTURE) ×1 IMPLANT
SUT VIC AB 1 CT1 27 (SUTURE) ×2
SUT VIC AB 1 CT1 27XBRD ANBCTR (SUTURE) ×1 IMPLANT
SUT VIC AB 2-0 CT1 27 (SUTURE) ×2
SUT VIC AB 2-0 CT1 TAPERPNT 27 (SUTURE) ×1 IMPLANT
TOWEL OR 17X24 6PK STRL BLUE (TOWEL DISPOSABLE) ×2 IMPLANT
TOWEL OR 17X26 10 PK STRL BLUE (TOWEL DISPOSABLE) ×2 IMPLANT
TRAY CATH 16FR W/PLASTIC CATH (SET/KITS/TRAYS/PACK) IMPLANT
TRAY FOLEY W/METER SILVER 16FR (SET/KITS/TRAYS/PACK) IMPLANT
WATER STERILE IRR 1000ML POUR (IV SOLUTION) ×2 IMPLANT

## 2017-10-23 NOTE — ED Notes (Signed)
[facility] at bedside 

## 2017-10-23 NOTE — Progress Notes (Signed)
Patient seen and examined, admitted by Dr. Toniann FailKakrakandy this morning.  Briefly 71 year old male with hypertension, epilepsy, diabetes, dementia presented after a mechanical fall.  Imagings showed right hip fracture. Dilantin level 23  BP 127/66 (BP Location: Right Arm)   Pulse 65   Temp 98.3 F (36.8 C) (Oral)   Resp 16   Ht 5\' 9"  (1.753 m)   Wt 79.4 kg (175 lb)   SpO2 99%   BMI 25.84 kg/m   A/p  Agree with Dr. Katherene PontoKakrakandy's assessment and plan   Right hip fracture -No active cardiac symptoms or any chest pain/dyspnea on exertion in the last 1 month.  Patient reports that he ambulates without any assistance.  No history of CAD/CHF.  2D echo 8/16 showed EF of 60-65%, no regional wall motion abnormalities. -Moderate risk for surgery due to age, hypertension, diabetes, dementia and risk of falls - NPO for plan for OR today, discussed with orthopedics  History of seizures -Dilantin level elevated 23 -Recheck Dilantin level in a.m. with albumin.  Admitting physician discussed with neurologist on-call, Dr Wilford CornerArora who recommended holding Dilantin for 24 hours and recheck level.  Further management per admission H&P   Ripudeep Rai M.D. [facility] 10/23/2017, 9:19 AM  Pager: [phone]

## 2017-10-23 NOTE — ED Provider Notes (Signed)
[facility]-EMERGENCY DEPT Provider Note   CSN: [id number] Arrival date & time: 10/23/17  0243     History   Chief Complaint Chief Complaint  Patient presents with  . Fall  . Hip Pain    HPI Arthur Berry is a 71 y.o. male.  The history is provided by the patient and medical records.  Fall   Hip Pain      71 year old male with history of arthritis, chronic kidney disease, coronary artery disease, diabetes, epilepsy, GERD, prior stroke, sleep apnea, presenting to the ED after a fall.  Patient is from [facility].  States he was trying to get to the bathroom when he tripped over some blankets hanging off his bed.  He fell onto the right side.  Does have some noted right leg shortening.  He denies any head injury or loss of consciousness.  He is not currently on anticoagulation.  No prior hip injuries or surgeries in the past.  Patient was given fentanyl in route, currently no pain.  Past Medical History:  Diagnosis Date  . Adjustment reaction with aggression 05/14/2017  . Arthritis   . Chronic kidney disease   . Colon polyp 2005   Tubular Adenoma  . Coronary artery disease, non-occlusive    Minimal nonobstructive CAD, nl LV systolic fxn by cath 12/11/12  . Diabetes mellitus 2001   TYPE 2  . Diverticulosis   . Epilepsy (HCC)   . GERD (gastroesophageal reflux disease) 04/23/2014  . Goiter, nontoxic, multinodular    Thyroid US 11/2012  . Hyperlipidemia 1999  . Hypertension 1999  . Internal hemorrhoids   . Neuromuscular disorder (HCC)   . OSA (obstructive sleep apnea)   . Prostate enlargement   . Sleep apnea   . Splinter 04/27/13   metal plinter removed rt pointer finger  . Stroke (HCC) 06/2015  . Vision changes 06/2015   since CVA    Patient Active Problem List   Diagnosis Date Noted  . Adjustment reaction with aggression 05/14/2017  . Type 2 diabetes mellitus with other circulatory complications (HCC) 06/19/2015  . Thrombocytopenia (HCC)  06/19/2015  . Cerebral infarction due to unspecified mechanism   . Dilantin toxicity 06/18/2015  . Phenytoin toxicity   . Stroke (HCC) 06/16/2015  . Fall 06/16/2015  . Epilepsy (HCC) 06/16/2015  . BPH (benign prostatic hyperplasia) 06/16/2015  . Leukocytosis 06/16/2015  . CVA (cerebral infarction) 06/16/2015  . CVA (cerebral vascular accident) (HCC) 06/15/2015  . GERD (gastroesophageal reflux disease) 04/23/2014  . Potassium (K) deficiency 12/11/2012  . Precordial pain 12/11/2012  . Dehydration 04/15/2012  . AKI (acute kidney injury) (HCC) 04/14/2012  . HLD (hyperlipidemia) 06/01/2010  . Essential hypertension, benign 06/01/2010  . DYSPNEA 06/01/2010  . Diabetes mellitus without complication (HCC) 11/04/2009  . RECTAL BLEEDING 11/04/2009  . PERSONAL HX COLONIC POLYPS 11/04/2009    Past Surgical History:  Procedure Laterality Date  . CHOLECYSTECTOMY    . COLONOSCOPY  12/28/2009  . ESOPHAGOGASTRODUODENOSCOPY  10/14/2012   normal   . INGUINAL HERNIA REPAIR  06/27/11   left  . KNEE SURGERY  2008   Left  . LEFT HEART CATHETERIZATION WITH CORONARY ANGIOGRAM N/A 12/11/2012   Procedure: LEFT HEART CATHETERIZATION WITH CORONARY ANGIOGRAM;  Surgeon: Tonny Bollman, MD;  Location: [facility] CATH LAB;  Service: Cardiovascular;  Laterality: N/A;       Home Medications    Prior to Admission medications   Medication Sig Start Date End Date Taking? Authorizing Provider  amLODipine (NORVASC)  2.5 MG tablet Take 2.5 mg by mouth daily.    [provider]  aspirin 325 MG tablet Take 1 tablet (325 mg total) by mouth daily. 06/22/15   Catarina Hartshornat, David, MD  atorvastatin (LIPITOR) 40 MG tablet Take 40 mg by mouth daily at 6 PM.     [provider]  carvedilol (COREG) 3.125 MG tablet Take 3.125 mg by mouth 2 (two) times daily with a meal.    [provider]  Cholecalciferol (VITAMIN D3) 5000 units TABS Take 5,000 Units by mouth daily.     [provider]  escitalopram  (LEXAPRO) 10 MG tablet Take 10 mg by mouth daily.    [provider]  galantamine (RAZADYNE ER) 16 MG 24 hr capsule Take 16 mg by mouth daily with breakfast.    [provider]  insulin aspart (NOVOLOG FLEXPEN) 100 UNIT/ML FlexPen Inject 0-14 Units into the skin See admin instructions. Inject 0-14 units twice daily per sliding scale. If sugar less than 120 units = 0 units, 121-150= 2 units, 151-180= 3 units, 181-210= 4 units, 211-250= 5 units, 251-280= 6 units, 281-330= 8 units, 331-400= 10 units, If BS is greater than 400 inject 14 units    [provider]  ivermectin (STROMECTOL) 3 MG TABS tablet Take 15 mg by mouth once.    [provider]  LANTUS SOLOSTAR 100 UNIT/ML Solostar Pen Inject 5 Units into the skin daily. In am 07/06/16   [provider]  levETIRAcetam (KEPPRA) 500 MG tablet Take 500 mg by mouth 2 (two) times daily.      [provider]  lisinopril (PRINIVIL,ZESTRIL) 20 MG tablet Take 1 tablet (20 mg total) by mouth daily. 06/22/15   Catarina Hartshornat, David, MD  LORazepam (ATIVAN) 0.5 MG tablet Take 0.5 mg by mouth at bedtime.    [provider]  magnesium hydroxide (MILK OF MAGNESIA) 400 MG/5ML suspension Take 30 mLs by mouth at bedtime as needed for mild constipation.     [provider]  magnesium oxide (MAG-OX) 400 MG tablet Take 400 mg by mouth daily.    [provider]  Melatonin 3 MG TABS Take 3 mg by mouth at bedtime.    [provider]  metFORMIN (GLUCOPHAGE) 500 MG tablet Take 500 mg by mouth 2 (two) times daily.     [provider]  mirtazapine (REMERON) 30 MG tablet Take 30 mg by mouth at bedtime.     [provider]  pantoprazole (PROTONIX) 40 MG tablet Take 1 tablet (40 mg total) by mouth daily. 06/22/15   Catarina Hartshornat, David, MD  phenytoin (DILANTIN) 100 MG ER capsule Take 3 capsules (300 mg total) by mouth daily. Start 06/23/2015 Patient taking differently: Take 200 mg by mouth every morning.   06/23/15   Catarina Hartshornat, David, MD  potassium chloride SA (K-DUR,KLOR-CON) 20 MEQ tablet Take 20 mEq by mouth daily.     [provider]  risperiDONE (RISPERDAL) 2 MG tablet Take 2 mg by mouth at bedtime.     [provider]    Family History Family History  Problem Relation Age of Onset  . Heart disease Mother   . Alzheimer's disease Father   . Breast cancer Sister   . Colon cancer Unknown        unknown  . Diabetes Sister     Social History Social History   Tobacco Use  . Smoking status: Former Smoker    Types: Cigars    Last attempt to quit:  04/14/1986    Years since quitting: 31.5  . Smokeless tobacco: Never Used  . Tobacco comment: Quit cigars in the 1980s  Substance Use Topics  . Alcohol use: No  . Drug use: No     Allergies   Patient has no known allergies.   Review of Systems Review of Systems  Musculoskeletal: Positive for arthralgias.  All other systems reviewed and are negative.    Physical Exam Updated Vital Signs BP (!) 142/78 (BP Location: Right Arm)   Pulse 63   Temp 98.7 F (37.1 C) (Oral)   Resp 14   Ht 5\' 9"  (1.753 m)   Wt 79.4 kg (175 lb)   SpO2 98%   BMI 25.84 kg/m   Physical Exam  Constitutional: He is oriented to person, place, and time. He appears well-developed and well-nourished.  HENT:  Head: Normocephalic and atraumatic.  Mouth/Throat: Oropharynx is clear and moist.  Eyes: Conjunctivae and EOM are normal. Pupils are equal, round, and reactive to light.  Neck: Normal range of motion.  Cardiovascular: Normal rate, regular rhythm and normal heart sounds.  Pulmonary/Chest: Effort normal and breath sounds normal.  Abdominal: Soft. Bowel sounds are normal.  Musculoskeletal: Normal range of motion.  Sheet pelvis binder in place; some mild tenderness of the right hip; right leg appears shortened about 1inch when compared with left  Neurological: He is alert and oriented to person, place, and time.  Skin: Skin is warm and  dry.  Psychiatric: He has a normal mood and affect.  Nursing note and vitals reviewed.    ED Treatments / Results  Labs (all labs ordered are listed, but only abnormal results are displayed) Labs Reviewed  BASIC METABOLIC PANEL - Abnormal; Notable for the following components:      Result Value   Glucose, Bld 141 (*)    BUN 30 (*)    All other components within normal limits  CBC WITH DIFFERENTIAL/PLATELET - Abnormal; Notable for the following components:   WBC 11.3 (*)    RBC 3.92 (*)    Hemoglobin 12.5 (*)    HCT 36.3 (*)    Platelets 122 (*)    Neutro Abs 7.9 (*)    All other components within normal limits  PHENYTOIN LEVEL, TOTAL - Abnormal; Notable for the following components:   Phenytoin Lvl 23.3 (*)    All other components within normal limits  CBG MONITORING, ED - Abnormal; Notable for the following components:   Glucose-Capillary 173 (*)    All other components within normal limits  PROTIME-INR  TYPE AND SCREEN  ABO/RH    EKG  EKG Interpretation None       Radiology Dg Chest 1 View  Result Date: 10/23/2017 CLINICAL DATA:  Right hip fracture tonight EXAM: CHEST 1 VIEW COMPARISON:  07/26/2017 FINDINGS: A single supine view of the chest is negative for pneumothorax or large effusion. Mediastinal contours are normal with the exception of a thoracic inlet mass corresponding to the enlarged thyroid seen on prior CT examinations dating back to at least 06/17/2015. No airspace consolidation. Normal heart size. No displaced fractures in the chest. IMPRESSION: No acute cardiopulmonary findings.  Known thyroid mass or goiter. Electronically Signed   By: Ellery Plunkaniel R Mitchell M.D.   On: 10/23/2017 03:36   Dg Hip Unilat With Pelvis 2-3 Views Right  Result Date: 10/23/2017 CLINICAL DATA:  Larey SeatFell while attempting to get up to the bathroom this morning. EXAM: DG HIP (WITH OR WITHOUT PELVIS) 2-3V RIGHT COMPARISON:  None.  FINDINGS: There is a subcapital right hip fracture with  mild foreshortening. No dislocation. No radiographic findings to suggest a pathologic basis for the fracture. Bony pelvis is intact. Pubic symphysis and sacroiliac joints are intact. IMPRESSION: Subcapital right hip fracture. Electronically Signed   By: Ellery Plunk M.D.   On: 10/23/2017 03:31    Procedures Procedures (including critical care time)  Medications Ordered in ED Medications - No data to display   Initial Impression / Assessment and Plan / ED Course  I have reviewed the triage vital signs and the nursing notes.  Pertinent labs & imaging results that were available during my care of the patient were reviewed by me and considered in my medical decision making (see chart for details).  71 y.o. M here with [facility] fall [facility]F.  No head injury or LOC.  AAOx3 here.  No signs of head trauma on exam.  Right hip tenderness with leg shortening noted.  Leg remains NVI.  Clinical suspicion for hip fracture.  Will obtain screening labs, hip films as well as CXR and EKG.  Hip films do reveal right hip fracture.  Discussed with on-call orthopedics, Dr. Apolinar Junes group is operating at [facility] today, would prefer the patient transferred there in anticipation for repair later today.  Patient to remain n.p.o. until then.  Discussed with Dr. Toniann Fail-- will help facilitate admission and transfer to [place].  Final Clinical Impressions(s) / ED Diagnoses   Final diagnoses:  Closed subcapital fracture of right femur, initial encounter [facility])    ED Discharge Orders    None       Garlon Hatchet, PA-C 10/23/17 0602    Gilda Crease, MD 10/23/17 [phone]

## 2017-10-23 NOTE — Anesthesia Postprocedure Evaluation (Signed)
Anesthesia Post Note  Patient: Arthur Berry  Procedure(s) Performed: TOTAL HIP ARTHROPLASTY ANTERIOR APPROACH (Right )     Patient location during evaluation: PACU Anesthesia Type: Spinal Level of consciousness: oriented, awake and sedated Pain management: pain level controlled Vital Signs Assessment: post-procedure vital signs reviewed and stable Respiratory status: spontaneous breathing, respiratory function stable and patient connected to nasal cannula oxygen Cardiovascular status: blood pressure returned to baseline and stable Postop Assessment: no headache, no backache and no apparent nausea or vomiting Anesthetic complications: no    Last Vitals:  Vitals:   10/23/17 1437 10/23/17 1447  BP: 120/73 115/81  Pulse: 71 73  Resp: 13 17  Temp:  36.7 C  SpO2: 94% 93%    Last Pain:  Vitals:   10/23/17 1447  TempSrc:   PainSc: Asleep                 Azoria Abbett,JAMES TERRILL

## 2017-10-23 NOTE — Brief Op Note (Signed)
10/23/2017  11:52 AM  PATIENT:  Arthur Berry  71 y.o. male  PRE-OPERATIVE DIAGNOSIS:  RIGHT HIP FRACTURE  POST-OPERATIVE DIAGNOSIS:  RIGHT HIP FRACTURE  PROCEDURE:  Procedure(s): TOTAL HIP ARTHROPLASTY ANTERIOR APPROACH (Right)  SURGEON:  Surgeon(s) and Role:    * Kathryne HitchBlackman, Jaeliana Lococo Y, MD - Primary  PHYSICIAN ASSISTANT: Rexene EdisonGil Clark, PA-C  ANESTHESIA:   spinal  EBL:  500 mL   COUNTS:  YES  DICTATION: .Other Dictation: Dictation Number [id number]  PLAN OF CARE: Admit to inpatient   PATIENT DISPOSITION:  PACU - hemodynamically stable.   Delay start of Pharmacological VTE agent (>24hrs) due to surgical blood loss or risk of bleeding: no

## 2017-10-23 NOTE — ED Notes (Signed)
Pt became anxious and started yelling out "help me!" over and over. PA notified and ativan ordered.

## 2017-10-23 NOTE — Consult Note (Signed)
I have seen and examined the patient and spoken with family to obtain consent for surgery.  We plan on proceeding to surgery today for a right hip replacement to treat his right hip displaced femoral neck fracture.  I have reviewed Charma IgoMichael Jeffery, PA-C's note and agree with his findings.  Risk and benefits were explained to family via phone.

## 2017-10-23 NOTE — Anesthesia Procedure Notes (Signed)
Spinal  Patient location during procedure: OR Start time: 10/23/2017 10:30 AM End time: 10/23/2017 10:35 AM Staffing Anesthesiologist: Leonides GrillsEllender, Arnika Larzelere P, MD Performed: anesthesiologist  Preanesthetic Checklist Completed: patient identified, surgical consent, pre-op evaluation, timeout performed, IV checked, risks and benefits discussed and monitors and equipment checked Spinal Block Patient position: sitting Prep: DuraPrep Patient monitoring: cardiac monitor, continuous pulse ox and blood pressure Approach: right paramedian Location: L3-4 Injection technique: single-shot Needle Needle type: Pencan  Needle gauge: 24 G Needle length: 9 cm Assessment Sensory level: T10 Additional Notes Functioning IV was confirmed and monitors were applied. Sterile prep and drape, including hand hygiene and sterile gloves were used. The patient was positioned and the spine was prepped. The skin was anesthetized with lidocaine.  Free flow of clear CSF was obtained prior to injecting local anesthetic into the CSF.  The spinal needle aspirated freely following injection.  The needle was carefully withdrawn.  The patient tolerated the procedure well.

## 2017-10-23 NOTE — ED Triage Notes (Signed)
[facility] Pt tripped over blankets and fell while ring to get to bathroom. Pt complaining of right hip pain. Shortening on right side. Pt received 100mcg of Fentanyl prior to arrival. Not on blood thinners. Pt has hx of dementia but is very alert and aware of situation.  20g L upper arm 132/86 HR 64 98% RA Resp 16 CBG 168 (pt is diabetic)

## 2017-10-23 NOTE — ED Notes (Signed)
Attempt report x1  

## 2017-10-23 NOTE — ED Notes (Signed)
Bed: [id number] Expected date:  Expected time:  Means of arrival:  Comments: 71 yr old fall, hip pain

## 2017-10-23 NOTE — Progress Notes (Signed)
Patient arrives to short stay alert to person only. Per reporting nurse [facility]. Consent obtained from family due to mental status.

## 2017-10-23 NOTE — ED Notes (Signed)
I attempted to collect labs 3 times and was not unsuccessful

## 2017-10-23 NOTE — Transfer of Care (Signed)
Immediate Anesthesia Transfer of Care Note  Patient: Arthur Berry  Procedure(s) Performed: TOTAL HIP ARTHROPLASTY ANTERIOR APPROACH (Right )  Patient Location: PACU  Anesthesia Type:MAC and Spinal  Level of Consciousness: sedated and patient cooperative  Airway & Oxygen Therapy: Patient Spontanous Breathing  Post-op Assessment: Report given to RN and Post -op Vital signs reviewed and stable  Post vital signs: Reviewed and stable  Last Vitals:  Vitals:   10/23/17 0900 10/23/17 1206  BP: 127/66 (P) 103/62  Pulse: 65   Resp: 16 (P) 14  Temp: 36.8 C (!) (P) 36.4 C  SpO2: 99% (P) 99%    Last Pain:  Vitals:   10/23/17 0900  TempSrc: Oral  PainSc:          Complications: No apparent anesthesia complications

## 2017-10-23 NOTE — Consult Note (Signed)
Reason for Consult:Right hip fx Referring Physician: Delmont Prosch Arthur Berry is an 71 y.o. male.  HPI: Arthur Berry was at home going to the bathroom when he tripped over a blanket and fell. He had immediate pain in his right hip and could not get up or ambulate. He was brought to [facility] where he was diagnosed with a subcapital femur fx and orthopedic surgery was consulted. He was transferred to [facility] for definitive treatment. He has a RW for ambulation but only sometimes uses it in the home.  Past Medical History:  Diagnosis Date  . Adjustment reaction with aggression 05/14/2017  . Arthritis   . Chronic kidney disease   . Colon polyp 2005   Tubular Adenoma  . Coronary artery disease, non-occlusive    Minimal nonobstructive CAD, nl LV systolic fxn by cath 5/62/56  . Diabetes mellitus 2001   TYPE 2  . Diverticulosis   . Epilepsy ([place])   . GERD (gastroesophageal reflux disease) 04/23/2014  . Goiter, nontoxic, multinodular    Thyroid US 11/2012  . Hyperlipidemia 1999  . Hypertension 1999  . Internal hemorrhoids   . Neuromuscular disorder ([place])   . OSA (obstructive sleep apnea)   . Prostate enlargement   . Sleep apnea   . Splinter 04/27/13   metal plinter removed rt pointer finger  . Stroke ([place]) 06/2015  . Vision changes 06/2015   since CVA    Past Surgical History:  Procedure Laterality Date  . CHOLECYSTECTOMY    . COLONOSCOPY  12/28/2009  . ESOPHAGOGASTRODUODENOSCOPY  10/14/2012   normal   . INGUINAL HERNIA REPAIR  06/27/11   left  . KNEE SURGERY  2008   Left  . LEFT HEART CATHETERIZATION WITH CORONARY ANGIOGRAM N/A 12/11/2012   Procedure: LEFT HEART CATHETERIZATION WITH CORONARY ANGIOGRAM;  Surgeon: Sherren Mocha, MD;  Location: [facility] CATH LAB;  Service: Cardiovascular;  Laterality: N/A;    Family History  Problem Relation Age of Onset  . Heart disease Mother   . Alzheimer's disease Father   . Breast cancer Sister   . Colon cancer Unknown        unknown  . Diabetes Sister      Social History:  reports that he quit smoking about 31 years ago. His smoking use included cigars. he has never used smokeless tobacco. He reports that he does not drink alcohol or use drugs.  Allergies: No Known Allergies  Medications: I have reviewed the patient's current medications.  Results for orders placed or performed during the hospital encounter of 10/23/17 (from the past 48 hour(s))  Basic metabolic panel     Status: Abnormal   Collection Time: 10/23/17  2:54 AM  Result Value Ref Range   Sodium 141 135 - 145 mmol/L   Potassium 4.1 3.5 - 5.1 mmol/L   Chloride 102 101 - 111 mmol/L   CO2 30 22 - 32 mmol/L   Glucose, Bld 141 (H) 65 - 99 mg/dL   BUN 30 (H) 6 - 20 mg/dL   Creatinine, Ser 1.12 0.61 - 1.24 mg/dL   Calcium 9.2 8.9 - 10.3 mg/dL   GFR calc non Af Amer >60 >60 mL/min   GFR calc Af Amer >60 >60 mL/min    Comment: (NOTE) The eGFR has been calculated using the CKD EPI equation. This calculation has not been validated in all clinical situations. eGFR's persistently <60 mL/min signify possible Chronic Kidney Disease.    Anion gap 9 5 - 15  CBC WITH DIFFERENTIAL  Status: Abnormal   Collection Time: 10/23/17  2:54 AM  Result Value Ref Range   WBC 11.3 (H) 4.0 - 10.5 K/uL   RBC 3.92 (L) 4.22 - 5.81 MIL/uL   Hemoglobin 12.5 (L) 13.0 - 17.0 g/dL   HCT 36.3 (L) 39.0 - 52.0 %   MCV 92.6 78.0 - 100.0 fL   MCH 31.9 26.0 - 34.0 pg   MCHC 34.4 30.0 - 36.0 g/dL   RDW 13.2 11.5 - 15.5 %   Platelets 122 (L) 150 - 400 K/uL   Neutrophils Relative % 70 %   Neutro Abs 7.9 (H) 1.7 - 7.7 K/uL   Lymphocytes Relative 17 %   Lymphs Abs 1.9 0.7 - 4.0 K/uL   Monocytes Relative 7 %   Monocytes Absolute 0.8 0.1 - 1.0 K/uL   Eosinophils Relative 6 %   Eosinophils Absolute 0.7 0.0 - 0.7 K/uL   Basophils Relative 0 %   Basophils Absolute 0.0 0.0 - 0.1 K/uL  Protime-INR     Status: None   Collection Time: 10/23/17  2:54 AM  Result Value Ref Range   Prothrombin Time 14.1 11.4  - 15.2 seconds   INR 1.10   Type and screen [place]     Status: None   Collection Time: 10/23/17  2:54 AM  Result Value Ref Range   ABO/RH(D) A POS    Antibody Screen NEG    Sample Expiration 10/26/2017   Phenytoin level, total     Status: Abnormal   Collection Time: 10/23/17  2:54 AM  Result Value Ref Range   Phenytoin Lvl 23.3 (H) 10.0 - 20.0 ug/mL  ABO/Rh     Status: None   Collection Time: 10/23/17  3:46 AM  Result Value Ref Range   ABO/RH(D) A POS   CBG monitoring, ED     Status: Abnormal   Collection Time: 10/23/17  5:31 AM  Result Value Ref Range   Glucose-Capillary 173 (H) 65 - 99 mg/dL    Dg Chest 1 View  Result Date: 10/23/2017 CLINICAL DATA:  Right hip fracture tonight EXAM: CHEST 1 VIEW COMPARISON:  07/26/2017 FINDINGS: A single supine view of the chest is negative for pneumothorax or large effusion. Mediastinal contours are normal with the exception of a thoracic inlet mass corresponding to the enlarged thyroid seen on prior CT examinations dating back to at least 06/17/2015. No airspace consolidation. Normal heart size. No displaced fractures in the chest. IMPRESSION: No acute cardiopulmonary findings.  Known thyroid mass or goiter. Electronically Signed   By: Andreas Newport M.D.   On: 10/23/2017 03:36   Dg Hip Unilat With Pelvis 2-3 Views Right  Result Date: 10/23/2017 CLINICAL DATA:  [place] while attempting to get up to the bathroom this morning. EXAM: DG HIP (WITH OR WITHOUT PELVIS) 2-3V RIGHT COMPARISON:  None. FINDINGS: There is a subcapital right hip fracture with mild foreshortening. No dislocation. No radiographic findings to suggest a pathologic basis for the fracture. Bony pelvis is intact. Pubic symphysis and sacroiliac joints are intact. IMPRESSION: Subcapital right hip fracture. Electronically Signed   By: Andreas Newport M.D.   On: 10/23/2017 03:31    Review of Systems  Constitutional: Negative for weight loss.  HENT: Negative  for ear discharge, ear pain, hearing loss and tinnitus.   Eyes: Negative for blurred vision, double vision, photophobia and pain.  Respiratory: Negative for cough, sputum production and shortness of breath.   Cardiovascular: Negative for chest pain.  Gastrointestinal: Negative for  abdominal pain, nausea and vomiting.  Genitourinary: Negative for dysuria, flank pain, frequency and urgency.  Musculoskeletal: Positive for joint pain (Right hip). Negative for back pain, falls, myalgias and neck pain.  Neurological: Negative for dizziness, tingling, sensory change, focal weakness, loss of consciousness and headaches.  Endo/Heme/Allergies: Does not bruise/bleed easily.  Psychiatric/Behavioral: Negative for depression, memory loss and substance abuse. The patient is not nervous/anxious.    Blood pressure (!) 146/83, pulse 70, temperature 98.7 F (37.1 C), temperature source Oral, resp. rate 15, height '5\' 9"'  (1.753 m), weight 79.4 kg (175 lb), SpO2 97 %. Physical Exam  Constitutional: He appears well-developed and well-nourished. No distress.  HENT:  Head: Normocephalic.  Eyes: Conjunctivae are normal. Right eye exhibits no discharge. Left eye exhibits no discharge. No scleral icterus.  Neck: Normal range of motion.  Cardiovascular: Normal rate and regular rhythm.  Respiratory: Effort normal. No respiratory distress.  Musculoskeletal:  Bilateral shoulder, elbow, wrist, digits- no skin wounds, nontender, no instability, no blocks to motion  Sens  Ax/R/M/U intact  Mot   Ax/ R/ PIN/ M/ AIN/ U intact  Rad 2+  RLE No traumatic wounds, ecchymosis, or rash  TTP hip, mild shortening  No knee or ankle effusion  Knee stable to varus/ valgus and anterior/posterior stress  Sens SPN, TN intact, DPN absent  Motor EHL, ext, flex, evers 5/5  DP 2+, PT 1+, No significant edema  LLE No traumatic wounds, ecchymosis, or rash  Nontender  No knee or ankle effusion  Knee stable to varus/ valgus and  anterior/posterior stress  Sens DPN, SPN, TN intact  Motor EHL, ext, flex, evers 5/5  DP 2+, PT 1+, No significant edema  Neurological: He is alert.  Skin: Skin is warm and dry. He is not diaphoretic.  Psychiatric: He has a normal mood and affect. His behavior is normal.    Assessment/Plan: Fall Right subcapital femur fx -- Dr. Ninfa Linden to perform THA this morning. Please keep NPO. I spoke with IM who cleared pt; note to go in later. Multiple medical problems -- per IM    Lisette Abu, PA-C Orthopedic Surgery [phone] 10/23/2017, 8:52 AM

## 2017-10-23 NOTE — ED Notes (Signed)
ED TO INPATIENT HANDOFF REPORT  Name/Age/Gender Arthur Berry 71 y.o. male  Code Status    Code Status Orders  (From admission, onward)        Start     Ordered   10/23/17 0517  Full code  Continuous     10/23/17 0522    Code Status History    Date Active Date Inactive Code Status Order ID Comments User Context   05/13/2017 22:49 05/14/2017 17:01 Full Code [id number]  [facility]   06/16/2015 00:24 06/22/2015 14:39 Full Code [id number]  Ivor Costa, MD ED      Home/SNF/Other Nursing Home  Chief Complaint Fall;Hip Pain  Level of Care/Admitting Diagnosis ED Disposition    ED Disposition Condition [place]: [place] [[id number]]  Level of Care: Telemetry [5]  Diagnosis: Closed right hip fracture, initial encounter [facility]) [[id number]]  Admitting Physician: Rise Patience [phone]  Attending Physician: Rise Patience [phone]  Estimated length of stay: past midnight tomorrow  Certification:: I certify this patient will need inpatient services for at least 2 midnights  PT Class (Do Not Modify): Inpatient [101]  PT Acc Code (Do Not Modify): Private [1]       Medical History Past Medical History:  Diagnosis Date  . Adjustment reaction with aggression 05/14/2017  . Arthritis   . Chronic kidney disease   . Colon polyp 2005   Tubular Adenoma  . Coronary artery disease, non-occlusive    Minimal nonobstructive CAD, nl LV systolic fxn by cath 3/71/06  . Diabetes mellitus 2001   TYPE 2  . Diverticulosis   . Epilepsy ([place])   . GERD (gastroesophageal reflux disease) 04/23/2014  . Goiter, nontoxic, multinodular    Thyroid US 11/2012  . Hyperlipidemia 1999  . Hypertension 1999  . Internal hemorrhoids   . Neuromuscular disorder ([place])   . OSA (obstructive sleep apnea)   . Prostate enlargement   . Sleep apnea   . Splinter 04/27/13   metal plinter removed rt pointer finger  . Stroke ([place]) 06/2015  . Vision changes 06/2015   since CVA    Allergies No Known Allergies  IV Location/Drains/Wounds Patient Lines/Drains/Airways Status   Active Line/Drains/Airways    Name:   Placement date:   Placement time:   Site:   Days:   Peripheral IV 10/23/17 Left;Upper Arm   10/23/17    -    Arm   less than 1   Peripheral IV 10/23/17 Left Antecubital   10/23/17    0345    Antecubital   less than 1          Labs/Imaging Results for orders placed or performed during the hospital encounter of 10/23/17 (from the past 48 hour(s))  Basic metabolic panel     Status: Abnormal   Collection Time: 10/23/17  2:54 AM  Result Value Ref Range   Sodium 141 135 - 145 mmol/L   Potassium 4.1 3.5 - 5.1 mmol/L   Chloride 102 101 - 111 mmol/L   CO2 30 22 - 32 mmol/L   Glucose, Bld 141 (H) 65 - 99 mg/dL   BUN 30 (H) 6 - 20 mg/dL   Creatinine, Ser 1.12 0.61 - 1.24 mg/dL   Calcium 9.2 8.9 - 10.3 mg/dL   GFR calc non Af Amer >60 >60 mL/min   GFR calc Af Amer >60 >60 mL/min    Comment: (NOTE) The eGFR has been calculated using the CKD EPI equation.  This calculation has not been validated in all clinical situations. eGFR's persistently <60 mL/min signify possible Chronic Kidney Disease.    Anion gap 9 5 - 15  CBC WITH DIFFERENTIAL     Status: Abnormal   Collection Time: 10/23/17  2:54 AM  Result Value Ref Range   WBC 11.3 (H) 4.0 - 10.5 K/uL   RBC 3.92 (L) 4.22 - 5.81 MIL/uL   Hemoglobin 12.5 (L) 13.0 - 17.0 g/dL   HCT 36.3 (L) 39.0 - 52.0 %   MCV 92.6 78.0 - 100.0 fL   MCH 31.9 26.0 - 34.0 pg   MCHC 34.4 30.0 - 36.0 g/dL   RDW 13.2 11.5 - 15.5 %   Platelets 122 (L) 150 - 400 K/uL   Neutrophils Relative % 70 %   Neutro Abs 7.9 (H) 1.7 - 7.7 K/uL   Lymphocytes Relative 17 %   Lymphs Abs 1.9 0.7 - 4.0 K/uL   Monocytes Relative 7 %   Monocytes Absolute 0.8 0.1 - 1.0 K/uL   Eosinophils Relative 6 %   Eosinophils Absolute 0.7 0.0 - 0.7 K/uL   Basophils Relative 0 %   Basophils Absolute 0.0 0.0 - 0.1 K/uL  Protime-INR      Status: None   Collection Time: 10/23/17  2:54 AM  Result Value Ref Range   Prothrombin Time 14.1 11.4 - 15.2 seconds   INR 1.10   Type and screen [place]     Status: None   Collection Time: 10/23/17  2:54 AM  Result Value Ref Range   ABO/RH(D) A POS    Antibody Screen NEG    Sample Expiration 10/26/2017   Phenytoin level, total     Status: Abnormal   Collection Time: 10/23/17  2:54 AM  Result Value Ref Range   Phenytoin Lvl 23.3 (H) 10.0 - 20.0 ug/mL  CBG monitoring, ED     Status: Abnormal   Collection Time: 10/23/17  5:31 AM  Result Value Ref Range   Glucose-Capillary 173 (H) 65 - 99 mg/dL   Dg Chest 1 View  Result Date: 10/23/2017 CLINICAL DATA:  Right hip fracture tonight EXAM: CHEST 1 VIEW COMPARISON:  07/26/2017 FINDINGS: A single supine view of the chest is negative for pneumothorax or large effusion. Mediastinal contours are normal with the exception of a thoracic inlet mass corresponding to the enlarged thyroid seen on prior CT examinations dating back to at least 06/17/2015. No airspace consolidation. Normal heart size. No displaced fractures in the chest. IMPRESSION: No acute cardiopulmonary findings.  Known thyroid mass or goiter. Electronically Signed   By: Andreas Newport M.D.   On: 10/23/2017 03:36   Dg Hip Unilat With Pelvis 2-3 Views Right  Result Date: 10/23/2017 CLINICAL DATA:  [place] while attempting to get up to the bathroom this morning. EXAM: DG HIP (WITH OR WITHOUT PELVIS) 2-3V RIGHT COMPARISON:  None. FINDINGS: There is a subcapital right hip fracture with mild foreshortening. No dislocation. No radiographic findings to suggest a pathologic basis for the fracture. Bony pelvis is intact. Pubic symphysis and sacroiliac joints are intact. IMPRESSION: Subcapital right hip fracture. Electronically Signed   By: Andreas Newport M.D.   On: 10/23/2017 03:31    Pending Labs Unresulted Labs (From admission, onward)   Start     Ordered    10/23/17 0346  ABO/Rh  Once,   R     10/23/17 0346      Vitals/Pain Today's Vitals   10/23/17 0247 10/23/17 0255 10/23/17  0436 10/23/17 0615  BP: (!) 142/78  (!) 167/89 (!) 153/86  Pulse: 63  75 73  Resp: _0 Temp: 98.7 F (37.1 C)     TempSrc: Oral     SpO2: 98%  99% 98%  Weight: 175 lb (79.4 kg)     Height: _1  (1.753 m)     PainSc:  10-Worst pain ever      Isolation Precautions No active isolations  Medications Medications  morphine 2 MG/ML injection 0.5 mg (not administered)  insulin aspart (novoLOG) injection 0-9 Units (not administered)  levETIRAcetam (KEPPRA) 500 mg in sodium chloride 0.9 % 100 mL IVPB (not administered)  hydrALAZINE (APRESOLINE) injection 10 mg (not administered)  metoprolol tartrate (LOPRESSOR) injection 2.5 mg (not administered)  LORazepam (ATIVAN) injection 0.5 mg (0.5 mg Intravenous Given 10/23/17 0437)    Mobility non-ambulatory

## 2017-10-23 NOTE — Op Note (Signed)
NAMDonella Stade:  Faley, Antwone                ACCOUNT NO.:  [id number]  MEDICAL RECORD NO.:  [id number]  LOCATION:  WA24                         [facility]  PHYSICIAN:  Vanita PandaChristopher Y. Magnus IvanBlackman, M.D.DATE OF BIRTH:  02-25-1946  DATE OF PROCEDURE:  10/23/2017 DATE OF DISCHARGE:                              OPERATIVE REPORT   PREOPERATIVE DIAGNOSIS:  Displaced right hip femoral neck fracture.  POSTOPERATIVE DIAGNOSIS:  Displaced right hip femoral neck fracture.  PROCEDURE:  Right total hip arthroplasty direct anterior approach.  IMPLANTS:  DePuy Sector Gription acetabular component size 52 with a single screw, size 36 +4 polyethylene liner, size 12 Corail femoral component with standard offset, size 36 +1.5 metal hip ball.  SURGEON:  Vanita PandaChristopher Y. Magnus IvanBlackman, M.D.  ASSISTANT:  Richardean CanalGilbert Clark, PA-C.  ANESTHESIA:  Spinal.  ANTIBIOTICS:  IV Ancef 2 g.  BLOOD LOSS:  500 mL.  COMPLICATIONS:  None.  INDICATIONS:  Mr. Modena Nunnerypple is a 71 year old gentleman, who does stay in the nursing home and obtained a mechanical fall yesterday.  He has had no previous hip issues, but has some imbalance with his gait.  He is only 71 years old.  [facility] was [facility] and found to have a displaced right hip femoral neck fracture.  He was admitted to the Medicine Service and [facility] for definitive surgery today due to OR availability and surgeon availability.  I was asked to see him as a consultation since I was operating this morning. I did talk to the patient in detail, but really talked to family members quite a bit about hip replacement surgery to treat this femoral neck fracture.  They understand fully the risks and benefits of the surgery and we had a long and thorough discussion about this including the risk of acute blood loss anemia, nerve and vessel injury, fracture, infection, dislocation, DVT.  They understand our goals are to decrease pain, improve mobility,  and overall improved quality of life, to be able to get him mobilizing soon to hopefully hold off on any complications of someone who goes without a fixed femoral neck.  He did receive medicine clearance as well.  PROCEDURE DESCRIPTION:  After informed consent was obtained, appropriate right hip was marked.  He was brought to the operating room, where spinal anesthesia was obtained while he was on the stretcher.  Traction boots were placed on both his feet.  He was placed supine on the Hana fracture table, the perineal post in place, and both legs in InLine skeletal traction devices, but no traction applied.  His right operative hip was prepped and draped with DuraPrep and sterile drapes.  A time-out was called and he was identified as correct patient, correct right hip.  We then made incision just inferior and posterior to the anterior superior iliac spine and carried this obliquely down the leg.  I dissected down to tensor fascia lata muscle and tensor fascia was then divided longitudinally to proceed with a direct anterior approach to the hip.  We identified and cauterized the circumflex vessels and identified the hip capsule.  I opened up the hip capsule finding a bloody hematoma and a joint  effusion from a displaced femoral neck fracture.  We made our femoral neck cut just proximal to the lesser trochanter.  I placed a corkscrew guide in the femoral head, removed the femoral head in its entirety.  We then cleaned other bony fragments from within the acetabulum and removed remnants of the acetabular labrum.  I then placed a bent Hohmann over the medial acetabular rim and began reaming from a size 42 reamer under direct visualization up to a size 52 with all reamers under direct visualization, the last reamer under direct fluoroscopy, so I could obtain my depth of reaming, my inclination, and anteversion.  Once I was pleased with this, I placed the real DePuy Sector Gription  acetabular component size 52 and a single screw.  I placed a 36 +4 polyethylene liner for that size acetabular component.  Attention was then turned to the femur.  With the leg externally rotated to 120 degrees extended and adducted, we were able to place a Mueller retractor medially and a Hohmann retractor behind the greater trochanter.  We released the lateral joint capsule and used a box cutting osteotome to enter the femoral canal and a rongeur to lateralize.  I then began broaching using a size 8 broach using the Corail broaching system going up to a size 12. With a size 12 in place, we trialed a standard offset femoral neck and a 36 +1.5 hip ball.  We brought the leg back over and up and with traction and internal rotation reducing the pelvis, and we were pleased with leg length, offset, and stability.  I felt he was just a touch long, but I felt like we needed this stability wise.  We then dislocated the hip and removed the trial components.  We were able to place the real Corail femoral component with standard offset, size 12 and a real 36 +1.5 metal hip ball reduced this in the acetabulum.  Again, we appreciate stability of the hip.  We then irrigated the soft tissue with normal saline solution.  We closed the joint capsule with interrupted #1 Ethibond suture, followed by running #1 Vicryl in the tensor fascia, 0 Vicryl in the deep tissue, 2-0 Vicryl subcutaneous tissue, interrupted staples on the skin.  Xeroform and Aquacel dressing were applied.  He was taken off the Hana table and taken to the recovery room in stable condition.  All final counts were correct.  There were no complications noted.     Vanita Pandahristopher Y. Magnus IvanBlackman, M.D.     CYB/MEDQ  D:  10/23/2017  T:  10/23/2017  Job:  [id number]

## 2017-10-23 NOTE — ED Provider Notes (Signed)
Patient presented to the ER with right hip pain after a fall.  Patient with mechanical, ground level fall.  No evidence of head injury.  Patient complaining of right hip pain.  Face to face Exam: HEENT - PERRLA Lungs - CTAB Heart - RRR, no M/R/G Abd - S/NT/ND Neuro - alert, oriented x3 Musculoskeletal -severe pain at right hip with decreased range of motion, leg shortened and externally rotated  Plan: Right hip pain after fall, suspect hip fracture.   Gilda CreasePollina, Maila Dukes J, MD 10/23/17 [phone]

## 2017-10-23 NOTE — Anesthesia Preprocedure Evaluation (Addendum)
Anesthesia Evaluation  Patient identified by MRN, date of birth, ID band Patient confused    Reviewed: Allergy & Precautions, NPO status , Patient's Chart, lab work & pertinent test results  Airway Mallampati: III  TM Distance: >3 FB Neck ROM: Full    Dental no notable dental hx.    Pulmonary sleep apnea , former smoker,    Pulmonary exam normal breath sounds clear to auscultation       Cardiovascular hypertension, Pt. on medications and Pt. on home beta blockers + CAD  Normal cardiovascular exam Rhythm:Regular Rate:Normal  ECG: SR, rate 66  ECHO 06/2015 - LV EF: 60% -   65%   Neuro/Psych Seizures -,  Vision changes Dementia CVA negative psych ROS   GI/Hepatic Neg liver ROS, GERD  Medicated and Controlled,  Endo/Other  diabetes, Insulin Dependent, Oral Hypoglycemic Agents  Renal/GU Renal disease     Musculoskeletal negative musculoskeletal ROS (+)   Abdominal   Peds  Hematology  (+) anemia , Hyperlipidemia   Anesthesia Other Findings  RIGHT HIP FRACTURE Ambulates with walker Pre-op eval per Hospitalists   Reproductive/Obstetrics                            Anesthesia Physical Anesthesia Plan  ASA: III and emergent  Anesthesia Plan: Spinal   Post-op Pain Management:    Induction: Intravenous  PONV Risk Score and Plan: 1 and Dexamethasone and Ondansetron  Airway Management Planned: Natural Airway  Additional Equipment:   Intra-op Plan:   Post-operative Plan:   Informed Consent: I have reviewed the patients History and Physical, chart, labs and discussed the procedure including the risks, benefits and alternatives for the proposed anesthesia with the patient or authorized representative who has indicated his/her understanding and acceptance.   Dental advisory given  Plan Discussed with: CRNA  Anesthesia Plan Comments:        Anesthesia Quick Evaluation

## 2017-10-23 NOTE — Anesthesia Procedure Notes (Signed)
Procedure Name: MAC Date/Time: 10/23/2017 10:25 AM Performed by: Lance Coon, CRNA Pre-anesthesia Checklist: Patient identified, Emergency Drugs available, Suction available, Patient being monitored and Timeout performed Patient Re-evaluated:Patient Re-evaluated prior to induction Oxygen Delivery Method: Simple face mask

## 2017-10-23 NOTE — H&P (Signed)
History and Physical    [name]:[id number] DOB: 1946-11-11 DOA: 10/23/2017  PCP: Rodrigo Ran, MD  Patient coming from: Home.  Chief Complaint: Fall.  HPI: Arthur Berry is a 71 y.o. male with history of hypertension, epilepsy, diabetes mellitus, dementia was brought to the ER the patient had a fall.  Patient states he was walking and suddenly fell denies hitting his head or losing consciousness.  Patient denies any palpitation chest pain or shortness of breath.  ED Course: In the ER x-rays revealed right hip fracture.  On-call orthopedic surgeon Dr. Dion Saucier was consulted.  Patient is being admitted for further management of right hip fracture.  Patient's Dilantin level is around 23.  Chest x-ray was unremarkable.  EKG shows normal sinus rhythm.  Review of Systems: As per HPI, rest all negative.   Past Medical History:  Diagnosis Date  . Adjustment reaction with aggression 05/14/2017  . Arthritis   . Chronic kidney disease   . Colon polyp 2005   Tubular Adenoma  . Coronary artery disease, non-occlusive    Minimal nonobstructive CAD, nl LV systolic fxn by cath 12/11/12  . Diabetes mellitus 2001   TYPE 2  . Diverticulosis   . Epilepsy (HCC)   . GERD (gastroesophageal reflux disease) 04/23/2014  . Goiter, nontoxic, multinodular    Thyroid US 11/2012  . Hyperlipidemia 1999  . Hypertension 1999  . Internal hemorrhoids   . Neuromuscular disorder (HCC)   . OSA (obstructive sleep apnea)   . Prostate enlargement   . Sleep apnea   . Splinter 04/27/13   metal plinter removed rt pointer finger  . Stroke (HCC) 06/2015  . Vision changes 06/2015   since CVA    Past Surgical History:  Procedure Laterality Date  . CHOLECYSTECTOMY    . COLONOSCOPY  12/28/2009  . ESOPHAGOGASTRODUODENOSCOPY  10/14/2012   normal   . INGUINAL HERNIA REPAIR  06/27/11   left  . KNEE SURGERY  2008   Left  . LEFT HEART CATHETERIZATION WITH CORONARY ANGIOGRAM N/A 12/11/2012   Procedure: LEFT HEART  CATHETERIZATION WITH CORONARY ANGIOGRAM;  Surgeon: Tonny Bollman, MD;  Location: [facility] CATH LAB;  Service: Cardiovascular;  Laterality: N/A;     reports that he quit smoking about 31 years ago. His smoking use included cigars. he has never used smokeless tobacco. He reports that he does not drink alcohol or use drugs.  No Known Allergies  Family History  Problem Relation Age of Onset  . Heart disease Mother   . Alzheimer's disease Father   . Breast cancer Sister   . Colon cancer Unknown        unknown  . Diabetes Sister     Prior to Admission medications   Medication Sig Start Date End Date Taking? Authorizing Provider  amLODipine (NORVASC) 2.5 MG tablet Take 2.5 mg by mouth daily.   Yes [provider]  aspirin 325 MG tablet Take 1 tablet (325 mg total) by mouth daily. 06/22/15  Yes Tat, Onalee Hua, MD  atorvastatin (LIPITOR) 40 MG tablet Take 40 mg by mouth daily at 6 PM.    Yes [provider]  carvedilol (COREG) 3.125 MG tablet Take 3.125 mg by mouth 2 (two) times daily with a meal.   Yes [provider]  Cholecalciferol (VITAMIN D3) 5000 units TABS Take 5,000 Units by mouth daily.    Yes [provider]  escitalopram (LEXAPRO) 10 MG tablet Take 10 mg by mouth daily.   Yes [provider]  galantamine (RAZADYNE ER) 16 MG 24 hr capsule Take 16 mg by mouth daily with breakfast.   Yes [provider]  insulin aspart (NOVOLOG FLEXPEN) 100 UNIT/ML FlexPen Inject 0-14 Units into the skin See admin instructions. Inject 0-14 units twice daily per sliding scale. If sugar less than 120 units = 0 units, 121-150= 2 units, 151-180= 3 units, 181-210= 4 units, 211-250= 5 units, 251-280= 6 units, 281-330= 8 units, 331-400= 10 units, If BS is greater than 400 inject 14 units   Yes [provider]  LANTUS SOLOSTAR 100 UNIT/ML Solostar Pen Inject 5 Units into the skin daily. In am 07/06/16  Yes [provider]  levETIRAcetam (KEPPRA) 500 MG  tablet Take 500 mg by mouth 2 (two) times daily.     Yes [provider]  lisinopril (PRINIVIL,ZESTRIL) 20 MG tablet Take 1 tablet (20 mg total) by mouth daily. 06/22/15  Yes Tat, Onalee Hua, MD  LORazepam (ATIVAN) 0.5 MG tablet Take 0.5 mg by mouth at bedtime.   Yes [provider]  magnesium oxide (MAG-OX) 400 MG tablet Take 400 mg by mouth daily.   Yes [provider]  Melatonin 3 MG TABS Take 3 mg by mouth at bedtime.   Yes [provider]  metFORMIN (GLUCOPHAGE) 500 MG tablet Take 500 mg by mouth 2 (two) times daily.    Yes [provider]  mirtazapine (REMERON) 30 MG tablet Take 30 mg by mouth at bedtime.    Yes [provider]  pantoprazole (PROTONIX) 40 MG tablet Take 1 tablet (40 mg total) by mouth daily. 06/22/15  Yes Tat, Onalee Hua, MD  phenytoin (DILANTIN) 100 MG ER capsule Take 3 capsules (300 mg total) by mouth daily. Start 06/23/2015 Patient taking differently: Take 200 mg by mouth every morning.  06/23/15  Yes Tat, Onalee Hua, MD  potassium chloride SA (K-DUR,KLOR-CON) 20 MEQ tablet Take 20 mEq by mouth daily.    Yes [provider]  risperiDONE (RISPERDAL) 2 MG tablet Take 2 mg by mouth at bedtime.    Yes [provider]  magnesium hydroxide (MILK OF MAGNESIA) 400 MG/5ML suspension Take 30 mLs by mouth at bedtime as needed for mild constipation.     [provider]    Physical Exam: Vitals:   10/23/17 0247 10/23/17 0436  BP: (!) 142/78 (!) 167/89  Pulse: 63 75  Resp: 14 13  Temp: 98.7 F (37.1 C)   TempSrc: Oral   SpO2: 98% 99%  Weight: 79.4 kg (175 lb)   Height: 5\' 9"  (1.753 m)       Constitutional: Moderately built and nourished. Vitals:   10/23/17 0247 10/23/17 0436  BP: (!) 142/78 (!) 167/89  Pulse: 63 75  Resp: 14 13  Temp: 98.7 F (37.1 C)   TempSrc: Oral   SpO2: 98% 99%  Weight: 79.4 kg (175 lb)   Height: 5\' 9"  (1.753 m)    Eyes: Anicteric no pallor. ENMT: No discharge from the ears eyes  nose or mouth. Neck: No mass felt.  No neck rigidity. Respiratory: No rhonchi or crepitations. Cardiovascular: S1-S2 heard no murmurs appreciated. Abdomen: Soft nontender bowel sounds present. Musculoskeletal: Pain on moving right hip. Skin: No rash. Neurologic: Alert awake oriented to time place and person.  Moves all extremities. Psychiatric: Appears normal.   Labs on Admission: I have personally reviewed following labs and imaging studies  CBC: Recent Labs  Lab 10/23/17 0254  WBC 11.3*  NEUTROABS 7.9*  HGB 12.5*  HCT  36.3*  MCV 92.6  PLT 122*   Basic Metabolic Panel: Recent Labs  Lab 10/23/17 0254  NA 141  K 4.1  CL 102  CO2 30  GLUCOSE 141*  BUN 30*  CREATININE 1.12  CALCIUM 9.2   GFR: Estimated Creatinine Clearance: 60.5 mL/min (by C-G formula based on SCr of 1.12 mg/dL). Liver Function Tests: No results for input(s): AST, ALT, ALKPHOS, BILITOT, PROT, ALBUMIN in the last 168 hours. No results for input(s): LIPASE, AMYLASE in the last 168 hours. No results for input(s): AMMONIA in the last 168 hours. Coagulation Profile: Recent Labs  Lab 10/23/17 0254  INR 1.10   Cardiac Enzymes: No results for input(s): CKTOTAL, CKMB, CKMBINDEX, TROPONINI in the last 168 hours. BNP (last 3 results) No results for input(s): PROBNP in the last 8760 hours. HbA1C: No results for input(s): HGBA1C in the last 72 hours. CBG: No results for input(s): GLUCAP in the last 168 hours. Lipid Profile: No results for input(s): CHOL, HDL, LDLCALC, TRIG, CHOLHDL, LDLDIRECT in the last 72 hours. Thyroid Function Tests: No results for input(s): TSH, T4TOTAL, FREET4, T3FREE, THYROIDAB in the last 72 hours. Anemia Panel: No results for input(s): VITAMINB12, FOLATE, FERRITIN, TIBC, IRON, RETICCTPCT in the last 72 hours. Urine analysis:    Component Value Date/Time   COLORURINE YELLOW 05/13/2017 2046   APPEARANCEUR HAZY (A) 05/13/2017 2046   LABSPEC 1.014 05/13/2017 2046   PHURINE  8.0 05/13/2017 2046   GLUCOSEU NEGATIVE 05/13/2017 2046   HGBUR SMALL (A) 05/13/2017 2046   BILIRUBINUR NEGATIVE 05/13/2017 2046   KETONESUR NEGATIVE 05/13/2017 2046   PROTEINUR NEGATIVE 05/13/2017 2046   UROBILINOGEN 0.2 06/15/2015 2200   NITRITE NEGATIVE 05/13/2017 2046   LEUKOCYTESUR NEGATIVE 05/13/2017 2046   Sepsis Labs: @LABRCNTIP (procalcitonin:4,lacticidven:4) )No results found for this or any previous visit (from the past 240 hour(s)).   Radiological Exams on Admission: Dg Chest 1 View  Result Date: 10/23/2017 CLINICAL DATA:  Right hip fracture tonight EXAM: CHEST 1 VIEW COMPARISON:  07/26/2017 FINDINGS: A single supine view of the chest is negative for pneumothorax or large effusion. Mediastinal contours are normal with the exception of a thoracic inlet mass corresponding to the enlarged thyroid seen on prior CT examinations dating back to at least 06/17/2015. No airspace consolidation. Normal heart size. No displaced fractures in the chest. IMPRESSION: No acute cardiopulmonary findings.  Known thyroid mass or goiter. Electronically Signed   By: Ellery Plunkaniel R Mitchell M.D.   On: 10/23/2017 03:36   Dg Hip Unilat With Pelvis 2-3 Views Right  Result Date: 10/23/2017 CLINICAL DATA:  Larey SeatFell while attempting to get up to the bathroom this morning. EXAM: DG HIP (WITH OR WITHOUT PELVIS) 2-3V RIGHT COMPARISON:  None. FINDINGS: There is a subcapital right hip fracture with mild foreshortening. No dislocation. No radiographic findings to suggest a pathologic basis for the fracture. Bony pelvis is intact. Pubic symphysis and sacroiliac joints are intact. IMPRESSION: Subcapital right hip fracture. Electronically Signed   By: Ellery Plunkaniel R Mitchell M.D.   On: 10/23/2017 03:31    EKG: Independently reviewed.  Normal sinus rhythm.  Assessment/Plan Principal Problem:   Closed right hip fracture, initial encounter [facility](HCC) Active Problems:   Essential hypertension, benign   Epilepsy (HCC)   Type 2 diabetes  mellitus with other circulatory complications (HCC)   Thrombocytopenia (HCC)    1. Right hip fracture status post mechanical fall -patient is at moderate risk for intermediate risk procedure.  Patient will be kept n.p.o. in anticipation of hip surgery.  For  pain relief medications. 2. Elevated Dilantin level -I have discussed with on-call neurologist Dr. Wilford CornerArora who advised to hold Dilantin for now and recheck Dilantin levels in 24 hours.  Until Dilantin level comes less than 20 not to restart Dilantin. 3. History of seizures -on Keppra and Dilantin.  I will dose Keppra IV until patient can take orally.  About Dilantin C #2. 4. Hypertension -since patient is n.p.o. I have dosed beta-blockers IV scheduled and as needed IV hydralazine. 5. Diabetes mellitus type 2 -we will keep patient on sliding scale. 6. Thrombocytopenia appears to be chronic. 7. Goiter seen on x-ray -will need further workup as outpatient.   DVT prophylaxis: SCDs. Code Status: Full code. Family Communication: Discussed with patient. Disposition Plan: To be determined. Consults called: Orthopedics. Admission status: Inpatient.   Eduard ClosArshad N Eliani Leclere MD [facility] Pager [phone].  If 7PM-7AM, please contact night-coverage [email or web address] Password [email or web address]  10/23/2017, 5:23 AM

## 2017-10-24 ENCOUNTER — Encounter (HOSPITAL_COMMUNITY): Payer: Self-pay | Admitting: Orthopaedic Surgery

## 2017-10-24 DIAGNOSIS — I10 Essential (primary) hypertension: Secondary | ICD-10-CM

## 2017-10-24 DIAGNOSIS — G40409 Other generalized epilepsy and epileptic syndromes, not intractable, without status epilepticus: Secondary | ICD-10-CM

## 2017-10-24 DIAGNOSIS — F0391 Unspecified dementia with behavioral disturbance: Secondary | ICD-10-CM

## 2017-10-24 DIAGNOSIS — Z96641 Presence of right artificial hip joint: Secondary | ICD-10-CM

## 2017-10-24 DIAGNOSIS — D696 Thrombocytopenia, unspecified: Secondary | ICD-10-CM

## 2017-10-24 LAB — COMPREHENSIVE METABOLIC PANEL
ALBUMIN: 3.2 g/dL — AB (ref 3.5–5.0)
ALT: 15 U/L — ABNORMAL LOW (ref 17–63)
AST: 24 U/L (ref 15–41)
Alkaline Phosphatase: 112 U/L (ref 38–126)
Anion gap: 10 (ref 5–15)
BILIRUBIN TOTAL: 0.7 mg/dL (ref 0.3–1.2)
BUN: 19 mg/dL (ref 6–20)
CO2: 26 mmol/L (ref 22–32)
Calcium: 8.3 mg/dL — ABNORMAL LOW (ref 8.9–10.3)
Chloride: 103 mmol/L (ref 101–111)
Creatinine, Ser: 1 mg/dL (ref 0.61–1.24)
GFR calc Af Amer: >60 mL/min (ref >60)
GFR calc non Af Amer: >60 mL/min (ref >60)
GLUCOSE: 180 mg/dL — AB (ref 65–99)
POTASSIUM: 3.3 mmol/L — AB (ref 3.5–5.1)
Sodium: 139 mmol/L (ref 135–145)
TOTAL PROTEIN: 5.5 g/dL — AB (ref 6.5–8.1)

## 2017-10-24 LAB — CBC
HEMATOCRIT: 28.4 % — AB (ref 39.0–52.0)
Hemoglobin: 9.6 g/dL — ABNORMAL LOW (ref 13.0–17.0)
MCH: 31.1 pg (ref 26.0–34.0)
MCHC: 33.8 g/dL (ref 30.0–36.0)
MCV: 91.9 fL (ref 78.0–100.0)
Platelets: 100 10*3/uL — ABNORMAL LOW (ref 150–400)
RBC: 3.09 MIL/uL — ABNORMAL LOW (ref 4.22–5.81)
RDW: 13.3 % (ref 11.5–15.5)
WBC: 11.5 10*3/uL — ABNORMAL HIGH (ref 4.0–10.5)

## 2017-10-24 LAB — GLUCOSE, CAPILLARY
GLUCOSE-CAPILLARY: 171 mg/dL — AB (ref 65–99)
GLUCOSE-CAPILLARY: 185 mg/dL — AB (ref 65–99)
GLUCOSE-CAPILLARY: 158 mg/dL — AB (ref 65–99)
GLUCOSE-CAPILLARY: 193 mg/dL — AB (ref 65–99)
GLUCOSE-CAPILLARY: 147 mg/dL — AB (ref 65–99)

## 2017-10-24 LAB — PHENYTOIN LEVEL, TOTAL: Phenytoin Lvl: 15.1 ug/mL (ref 10.0–20.0)

## 2017-10-24 MED ORDER — ESCITALOPRAM OXALATE 10 MG PO TABS
10.0000 mg | ORAL_TABLET | Freq: Every day | ORAL | Status: DC
  Administered 2017-10-24 – 2017-10-27 (×4): 10 mg via ORAL
  Filled 2017-10-24 (×4): qty 1

## 2017-10-24 MED ORDER — MELATONIN 3 MG PO TABS
3.0000 mg | ORAL_TABLET | Freq: Every day | ORAL | Status: DC
Start: 2017-10-24 — End: 2017-10-27
  Administered 2017-10-24 – 2017-10-26 (×3): 3 mg via ORAL
  Filled 2017-10-24 (×4): qty 1

## 2017-10-24 MED ORDER — MIRTAZAPINE 15 MG PO TABS
30.0000 mg | ORAL_TABLET | Freq: Every day | ORAL | Status: DC
  Administered 2017-10-24 – 2017-10-26 (×3): 30 mg via ORAL
  Filled 2017-10-24 (×3): qty 2

## 2017-10-24 MED ORDER — CARVEDILOL 3.125 MG PO TABS
3.1250 mg | ORAL_TABLET | Freq: Two times a day (BID) | ORAL | Status: DC
  Administered 2017-10-25 – 2017-10-27 (×4): 3.125 mg via ORAL
  Filled 2017-10-24 (×4): qty 1

## 2017-10-24 MED ORDER — RISPERIDONE 0.5 MG PO TABS
2.0000 mg | ORAL_TABLET | Freq: Every day | ORAL | Status: DC
  Administered 2017-10-24 – 2017-10-26 (×3): 2 mg via ORAL
  Filled 2017-10-24 (×3): qty 4

## 2017-10-24 MED ORDER — PANTOPRAZOLE SODIUM 40 MG PO TBEC
40.0000 mg | DELAYED_RELEASE_TABLET | Freq: Every day | ORAL | Status: DC
  Administered 2017-10-25 – 2017-10-27 (×3): 40 mg via ORAL
  Filled 2017-10-24 (×3): qty 1

## 2017-10-24 MED ORDER — MAGNESIUM OXIDE 400 (241.3 MG) MG PO TABS
400.0000 mg | ORAL_TABLET | Freq: Every day | ORAL | Status: DC
  Administered 2017-10-24 – 2017-10-27 (×4): 400 mg via ORAL
  Filled 2017-10-24 (×4): qty 1

## 2017-10-24 MED ORDER — LORAZEPAM 2 MG/ML IJ SOLN: 1.0000 mg | Freq: Two times a day (BID) | INTRAMUSCULAR | Status: DC | PRN

## 2017-10-24 MED ORDER — INSULIN ASPART 100 UNIT/ML ~~LOC~~ SOLN
0.0000 [IU] | Freq: Three times a day (TID) | SUBCUTANEOUS | Status: DC
  Administered 2017-10-25 – 2017-10-26 (×6): 2 [IU] via SUBCUTANEOUS
  Administered 2017-10-27: 1 [IU] via SUBCUTANEOUS
  Administered 2017-10-27: 2 [IU] via SUBCUTANEOUS

## 2017-10-24 MED ORDER — ATORVASTATIN CALCIUM 40 MG PO TABS
40.0000 mg | ORAL_TABLET | Freq: Every day | ORAL | Status: DC
  Administered 2017-10-24 – 2017-10-26 (×3): 40 mg via ORAL
  Filled 2017-10-24 (×3): qty 1

## 2017-10-24 MED ORDER — GALANTAMINE HYDROBROMIDE ER 8 MG PO CP24
16.0000 mg | ORAL_CAPSULE | Freq: Every day | ORAL | Status: DC
  Administered 2017-10-25 – 2017-10-27 (×3): 16 mg via ORAL
  Filled 2017-10-24 (×3): qty 2

## 2017-10-24 MED ORDER — PHENYTOIN 50 MG PO CHEW
150.0000 mg | CHEWABLE_TABLET | Freq: Every day | ORAL | Status: DC
  Administered 2017-10-25 – 2017-10-27 (×3): 150 mg via ORAL
  Filled 2017-10-24 (×3): qty 3

## 2017-10-24 MED ORDER — POTASSIUM CHLORIDE CRYS ER 20 MEQ PO TBCR
40.0000 meq | EXTENDED_RELEASE_TABLET | Freq: Once | ORAL | Status: AC
  Administered 2017-10-24: 40 meq via ORAL
  Filled 2017-10-24: qty 2

## 2017-10-24 MED ORDER — LORAZEPAM 0.5 MG PO TABS
0.5000 mg | ORAL_TABLET | Freq: Every day | ORAL | Status: DC
  Administered 2017-10-24 – 2017-10-26 (×3): 0.5 mg via ORAL
  Filled 2017-10-24 (×3): qty 1

## 2017-10-24 MED ORDER — INSULIN ASPART 100 UNIT/ML ~~LOC~~ SOLN
0.0000 [IU] | Freq: Every day | SUBCUTANEOUS | Status: DC
  Administered 2017-10-25: 2 [IU] via SUBCUTANEOUS

## 2017-10-24 MED ORDER — VITAMIN D 1000 UNITS PO TABS
5000.0000 [IU] | ORAL_TABLET | Freq: Every day | ORAL | Status: DC
  Administered 2017-10-24 – 2017-10-27 (×4): 5000 [IU] via ORAL
  Filled 2017-10-24 (×4): qty 5

## 2017-10-24 MED ORDER — HYDROCODONE-ACETAMINOPHEN 5-325 MG PO TABS: 1.0000 | ORAL_TABLET | ORAL | 0 refills | Status: DC | PRN

## 2017-10-24 MED ORDER — AMLODIPINE BESYLATE 2.5 MG PO TABS
2.5000 mg | ORAL_TABLET | Freq: Every day | ORAL | Status: DC
  Administered 2017-10-25 – 2017-10-27 (×2): 2.5 mg via ORAL
  Filled 2017-10-24 (×4): qty 1

## 2017-10-24 MED ORDER — ENSURE ENLIVE PO LIQD
237.0000 mL | Freq: Two times a day (BID) | ORAL | Status: DC
  Administered 2017-10-24 – 2017-10-27 (×6): 237 mL via ORAL

## 2017-10-24 NOTE — Evaluation (Signed)
Physical Therapy Evaluation Patient Details Name: Arthur BurySamuel W Berry MRN: [id number] DOB: 10/02/1946 Today's Date: 10/24/2017   History of Present Illness  Admitted with R hip fracture, now s/p R THA Anterior Approach, WBAT;  has a past medical history of Adjustment reaction with aggression (05/14/2017), Arthritis, Chronic kidney disease, Colon polyp (2005), Coronary artery disease, non-occlusive, Diabetes mellitus (2001), Diverticulosis, Epilepsy (HCC), GERD (gastroesophageal reflux disease) (04/23/2014), Goiter, nontoxic, multinodular, Hyperlipidemia (1999), Hypertension (1999), Internal hemorrhoids, Neuromuscular disorder (HCC), OSA (obstructive sleep apnea), Prostate enlargement, Sleep apnea, Splinter (04/27/13), Stroke (HCC) (06/2015), and Vision changes (06/2015).  Clinical Impression   Patient is s/p above surgery resulting in functional limitations due to the deficits listed below (see PT Problem List). Presents with significant deficits in functional mobility -- some related to pain with R hip fracture and surgery, however mobiltiy deficits largely spring from previous CVA; Pt was unable to give detailed information re: PLOF; At this time, [name] shows decr sitting balance and tolerance, decr midline orientation in sitting with significant R lean (which is counterintuitive, given fracture and surgery was on the R); Resemble pusher syndrome;  Patient will benefit from skilled PT to increase their independence and safety with mobility to allow discharge to the venue listed below.       Follow Up Recommendations SNF    Equipment Recommendations  Wheelchair (measurements PT);Wheelchair cushion (measurements PT)(tbd at post-acute rehab)    Recommendations for Other Services       Precautions / Restrictions Precautions Precautions: Fall Restrictions Weight Bearing Restrictions: No RLE Weight Bearing: Weight bearing as tolerated      Mobility  Bed Mobility Overal bed mobility: Needs  Assistance Bed Mobility: Supine to Sit     Supine to sit: +2 for physical assistance;Total assist     General bed mobility comments: Total assist for all aspects of bed mobility; full support at back to elevate trunk to sit; assist to move LEs to EOB  Transfers Overall transfer level: Needs assistance Equipment used: 2 person hand held assist(and support at gait belt) Transfers: Stand Pivot Transfers   Stand pivot transfers: +2 physical assistance;Total assist       General transfer comment: Performed basic stand pivot transfer toward pt's non-operative L side with +2 Total assist and bil knees blocked for stability  Ambulation/Gait             General Gait Details: Unable  Stairs            Wheelchair Mobility    Modified Rankin (Stroke Patients Only)       Balance Overall balance assessment: Needs assistance Sitting-balance support: Feet supported;Bilateral upper extremity supported Sitting balance-Leahy Scale: Zero Sitting balance - Comments: Tending to push with stronger L arm to right side; cues to reach out to L to aid in midline orientation, and pt was able to reach to L and hold on to foot board of bed; Needed physical assist to stabilize in midline sitting; Resembles pusher syndrome Postural control: Right lateral lean   Standing balance-Leahy Scale: Zero                               Pertinent Vitals/Pain Pain Assessment: Faces Faces Pain Scale: Hurts whole lot Pain Location: R hip with pivot transfer Pain Descriptors / Indicators: Aching;Discomfort;Grimacing Pain Intervention(s): Monitored during session;Repositioned    Home Living Family/patient expects to be discharged to:: Skilled nursing facility  Additional Comments: I believe he was an an Assisted Living Center prior to this admission    Prior Function           Comments: Pt unable to give information re: PLOF     Hand Dominance         Extremity/Trunk Assessment   Upper Extremity Assessment Upper Extremity Assessment: Defer to OT evaluation(Noted RUE functional deficits)    Lower Extremity Assessment Lower Extremity Assessment: Generalized weakness;RLE deficits/detail RLE Deficits / Details: Grossly decr AROM and strength at hip, limited by pain postop; I'm unsure of residual deficits from CVA RLE: Unable to fully assess due to pain       Communication   Communication: No difficulties  Cognition Arousal/Alertness: Awake/alert(though sleepy) Behavior During Therapy: WFL for tasks assessed/performed;Flat affect Overall Cognitive Status: No family/caregiver present to determine baseline cognitive functioning Area of Impairment: Attention;Memory;Following commands;Safety/judgement;Problem solving                   Current Attention Level: Sustained Memory: Decreased short-term memory(noted history of dementia) Following Commands: Follows one step commands with increased time(and reminders) Safety/Judgement: Decreased awareness of safety;Decreased awareness of deficits   Problem Solving: Slow processing;Decreased initiation;Difficulty sequencing;Requires verbal cues;Requires tactile cues        General Comments      Exercises     Assessment/Plan    PT Assessment Patient needs continued PT services  PT Problem List Decreased strength;Decreased range of motion;Decreased activity tolerance;Decreased balance;Decreased mobility;Decreased coordination;Decreased cognition;Decreased knowledge of use of DME;Decreased safety awareness;Decreased knowledge of precautions;Pain       PT Treatment Interventions DME instruction;Gait training;Functional mobility training;Therapeutic activities;Therapeutic exercise;Balance training;Neuromuscular re-education;Cognitive remediation;Patient/family education    PT Goals (Current goals can be found in the Care Plan section)  Acute Rehab PT Goals Patient Stated Goal: Did  not state, but agreeable to getting up PT Goal Formulation: Patient unable to participate in goal setting Time For Goal Achievement: 11/07/17 Potential to Achieve Goals: Good    Frequency Min 3X/week   Barriers to discharge        Co-evaluation               AM-PAC PT "6 Clicks" Daily Activity  Outcome Measure Difficulty turning over in bed (including adjusting bedclothes, sheets and blankets)?: Unable Difficulty moving from lying on back to sitting on the side of the bed? : Unable Difficulty sitting down on and standing up from a chair with arms (e.g., wheelchair, bedside commode, etc,.)?: Unable Help needed moving to and from a bed to chair (including a wheelchair)?: Total Help needed walking in hospital room?: Total Help needed climbing 3-5 steps with a railing? : Total 6 Click Score: 6    End of Session Equipment Utilized During Treatment: Gait belt Activity Tolerance: Patient tolerated treatment well Patient left: in chair;with call bell/phone within reach;with chair alarm set Nurse Communication: Mobility status PT Visit Diagnosis: Unsteadiness on feet (R26.81);Other abnormalities of gait and mobility (R26.89);Pain;Other symptoms and signs involving the nervous system (R29.898);Hemiplegia and hemiparesis Hemiplegia - Right/Left: Right Hemiplegia - dominant/non-dominant: Non-dominant Hemiplegia - caused by: Cerebral infarction Pain - Right/Left: Right Pain - part of body: Hip    Time: 1610-9604 PT Time Calculation (min) (ACUTE ONLY): 30 min   Charges:   PT Evaluation $PT Eval Moderate Complexity: 1 Mod PT Treatments $Therapeutic Activity: 8-22 mins   PT G Codes:        Van Clines, PT  Acute Rehabilitation Services Pager [phone] Office [phone]   [facility]  Elsworth SohoH Ludene Stokke 10/24/2017, 2:53 PM

## 2017-10-24 NOTE — Progress Notes (Signed)
Subjective: 1 Day Post-Op Procedure(s) (LRB): TOTAL HIP ARTHROPLASTY ANTERIOR APPROACH (Right) Patient reports pain as moderate.  Likely baseline dementia.  Acute blood loss anemia from surgery.  Objective: Vital signs in last 24 hours: Temp:  [97.3 F (36.3 C)-99.5 F (37.5 C)] 98.4 F (36.9 C) (12/12 0558) Pulse Rate:  [65-96] 96 (12/12 0558) Resp:  [11-17] 16 (12/12 0558) BP: (89-160)/(52-88) 108/52 (12/12 0558) SpO2:  [93 %-100 %] 97 % (12/12 0558)  Intake/Output from previous day: 12/11 0701 - 12/12 0700 In: 2206.3 [I.V.:1956.3; IV Piggyback:250] Out: 1325 [Urine:825; Blood:500] Intake/Output this shift: No intake/output data recorded.  Recent Labs    10/23/17 0254 10/24/17 0452  HGB 12.5* 9.6*   Recent Labs    10/23/17 0254 10/24/17 0452  WBC 11.3* 11.5*  RBC 3.92* 3.09*  HCT 36.3* 28.4*  PLT 122* PENDING   Recent Labs    10/23/17 0254 10/24/17 0452  NA 141 139  K 4.1 3.3*  CL 102 103  CO2 30 26  BUN 30* 19  CREATININE 1.12 1.00  GLUCOSE 141* 180*  CALCIUM 9.2 8.3*   Recent Labs    10/23/17 0254  INR 1.10    Sensation intact distally Intact pulses distally Dorsiflexion/Plantar flexion intact Incision: scant drainage No cellulitis present  Assessment/Plan: 1 Day Post-Op Procedure(s) (LRB): TOTAL HIP ARTHROPLASTY ANTERIOR APPROACH (Right) Up with therapy Discharge to SNF in the next 1-2 days pending his stability.  Kathryne HitchChristopher Y Adaley Kiene 10/24/2017, 7:12 AM

## 2017-10-24 NOTE — Progress Notes (Signed)
.  rx MEDICATION RELATED CONSULT NOTE - INITIAL   Pharmacy Consult for Phenytoin Indication: Seizure history  No Known Allergies  Patient Measurements: Height: 5\' 9"  (175.3 cm) Weight: 175 lb (79.4 kg) IBW/kg (Calculated) : 70.7    Labs: Recent Labs    10/23/17 0254 10/24/17 0452  WBC 11.3* 11.5*  HGB 12.5* 9.6*  HCT 36.3* 28.4*  PLT 122* 100*  CREATININE 1.12 1.00  ALBUMIN  --  3.2*  PROT  --  5.5*  AST  --  24  ALT  --  15*  ALKPHOS  --  112  BILITOT  --  0.7   Estimated Creatinine Clearance: 67.8 mL/min (by C-G formula based on SCr of 1 mg/dL).   Assessment: 71 year old male on phenytoin prior to admission for seizure history. Phenytoin level elevated on admission on dose of 200 mg po daily at 23.3 on 12/11 Level today = 15.1, corrects to approximately 20 with albumin of 3.2   Goal of Therapy:  Phenytoin level = 10 to 20 mcg / dL  Plan:  Restart phenytoin at 150 mg po daily starting 12/13 Next level in 5 to 7 days  Thank you Okey RegalLisa Colan Laymon, PharmD [phone]  10/24/2017,5:15 PM

## 2017-10-24 NOTE — Progress Notes (Signed)
Initial Nutrition Assessment  DOCUMENTATION CODES:   Not applicable  INTERVENTION:    Ensure Enlive po BID, each supplement provides 350 kcal and 20 grams of protein  NUTRITION DIAGNOSIS:   Inadequate oral intake related to decreased appetite as evidenced by per patient/family report  GOAL:   Patient will meet greater than or equal to 90% of their needs  MONITOR:   PO intake, Supplement acceptance, Labs, Skin, Weight trends  REASON FOR ASSESSMENT:   Consult Hip fracture protocol  ASSESSMENT:   71 yo Male who was at home going to the bathroom when he tripped over a blanket and fell. He had immediate pain in his right hip and could not get up or ambulate. He was brought to [facility] where he was diagnosed with a subcapital femur fx and orthopedic surgery was consulted. [facility]   Pt s/p total hip arthroplasty 12/11. Pt resting in bed. Kept his eyes closed during RD interview. He reports a fair appetite. No % PO intake records available at this time.  Believes he's lost weight, however, does not know amount or time frame. Did not respond when asked if he'd like a nutrition supplement during hospitalization.  Will order as he would benefit from to help meet his nutritional needs.  Medications reviewed.  Labs reviewed. K  3.3 (L).  CBG's [phone].  NUTRITION - FOCUSED PHYSICAL EXAM:    Most Recent Value  Orbital Region  No depletion  Upper Arm Region  No depletion  Thoracic and Lumbar Region  No depletion  Buccal Region  No depletion  Temple Region  No depletion  Clavicle Bone Region  No depletion  Clavicle and Acromion Bone Region  No depletion  Scapular Bone Region  No depletion  Dorsal Hand  No depletion  Patellar Region  No depletion  Anterior Thigh Region  No depletion  Posterior Calf Region  No depletion  Edema (RD Assessment)  None     Diet Order:  Diet regular Room service appropriate? Yes; Fluid  consistency: Thin  EDUCATION NEEDS:   No education needs have been identified at this time  Skin:  Skin Assessment: Reviewed RN Assessment  Last BM:  N/A  Height:   Ht Readings from Last 1 Encounters:  10/23/17 5\' 9"  (1.753 m)    Weight:   Wt Readings from Last 1 Encounters:  10/23/17 175 lb (79.4 kg)    Ideal Body Weight:  72.7 kg  BMI:  Body mass index is 25.84 kg/m.  Estimated Nutritional Needs:   Kcal:  2000-2200  Protein:  100-115 gm  Fluid:  2.0-2.2 L  Maureen ChattersKatie Azka Steger, RD, LDN Pager #: [phone] After-Hours Pager #: [phone]

## 2017-10-24 NOTE — Progress Notes (Addendum)
PROGRESS NOTE   Arthur Berry  XBM:[id number]    DOB: 1946/03/27    DOA: 10/23/2017  PCP: Rodrigo RanPerini, Mark, MD   I have briefly reviewed patients previous medical records in [facility].  Brief Narrative:  71 year old male with PMH of HTN, epilepsy, DM, dementia presented to ED following a mechanical fall without report of hitting his head or losing consciousness. X-rays revealed right hip fracture. Orthopedics performed right total hip arthroplasty on 12/11. Postop course complicated by altered mental status.   Assessment & Plan:   Principal Problem:   Closed right hip fracture, initial encounter [facility](HCC) Active Problems:   Essential hypertension, benign   Epilepsy (HCC)   Type 2 diabetes mellitus with other circulatory complications (HCC)   Thrombocytopenia (HCC)   Closed subcapital fracture of right femur (HCC)   1. Right hip fracture status post total hip arthroplasty anterior approach 12/11: Orthopedics follow-up appreciated. Up with therapy. DC to SNF in 1-2 days pending stability. 2. Seizure disorder: Was on Dilantin and Keppra PTA. Presented with Dilantin level 23. His case was discussed by admitting physician with Neuro hospitalist/Dr. Wilford CornerArora who recommended holding Dilantin for 24 hours and rechecking levels. Keppra was continued. Repeat Dilantin level 15.2. Resume Dilantin per pharmacy. 3. Essential hypertension: Controlled. Resume home dose of amlodipine 2.5 MG daily and carvedilol 3.125 MG twice daily. Temporarily hold lisinopril. 4. Type II DM: Mildly uncontrolled and fluctuating. SSI. Hold metformin and Lantus that he was getting 5 units daily PTA 5. Acute blood loss anemia: Follow CBCs and transfuse if hemoglobin <7 g per DL. 6. Thrombocytopenia: Appears chronic. Stable. Follow CBCs. 7. Goiter: Seen on x-ray. Outpatient workup as deemed necessary. 8. Depression: Resume home dose of Lexapro 10 MG daily. 9. Dementia with behavioral abnormalities: Resume home dose of  galantamine,bedtime Risperdal, Ativan and Remeron. As per sister, patient has periods of agitation. 10. Hyperlipidemia: Continue statins. 11. Hypokalemia: Replace and follow.   DVT prophylaxis: SCDs and aspirin. Code Status: Full Family Communication: I discussed with patient's sister. Updated care and answered questions. Disposition: DC to SNF when medically improved   Consultants:  Orthopedics   Procedures:  Right hip total arthroplasty anterior approach 12/11  Antimicrobials:  None    Subjective: Seen this morning. Sleeping but easily arousable. Oriented to self and place. Confused. Denies pain or any other complaints. "I'm okay". As per nursing, extremely agitated this afternoon.   ROS: As above  Objective:  Vitals:   10/23/17 2027 10/24/17 0219 10/24/17 0558 10/24/17 1357  BP: 116/60 (!) 147/63 (!) 108/52 (!) 109/53  Pulse: 88 88 96 87  Resp: 16 16 16  (!) 22  Temp: 99.5 F (37.5 C) (!) 97.3 F (36.3 C) 98.4 F (36.9 C) 98.4 F (36.9 C)  TempSrc: Oral Oral Oral Oral  SpO2: 96% 98% 97% 94%  Weight:      Height:        Examination:  General exam: Elderly male, moderately built and nourished lying comfortably supine in bed. Respiratory system: Clear to auscultation. Respiratory effort normal. Cardiovascular system: S1 & S2 heard, RRR. No JVD, murmurs, rubs, gallops or clicks. No pedal edema. Gastrointestinal system: Abdomen is nondistended, soft and nontender. No organomegaly or masses felt. Normal bowel sounds heard. Central nervous system: Mental status as discussed above. No focal neurological deficits. Extremities: Moves bilateral upper extremities and left lower extremity well. Right hip postop dressing clean and dry. Right hip movements limited secondary to pain. Skin: No rashes, lesions or ulcers Psychiatry: Judgement and  insight appear impaired. Mood & affect appropriate.     Data Reviewed: I have personally reviewed following labs and imaging  studies  CBC: Recent Labs  Lab 10/23/17 0254 10/24/17 0452  WBC 11.3* 11.5*  NEUTROABS 7.9*  --   HGB 12.5* 9.6*  HCT 36.3* 28.4*  MCV 92.6 91.9  PLT 122* 100*   Basic Metabolic Panel: Recent Labs  Lab 10/23/17 0254 10/24/17 0452  NA 141 139  K 4.1 3.3*  CL 102 103  CO2 30 26  GLUCOSE 141* 180*  BUN 30* 19  CREATININE 1.12 1.00  CALCIUM 9.2 8.3*   Liver Function Tests: Recent Labs  Lab 10/24/17 0452  AST 24  ALT 15*  ALKPHOS 112  BILITOT 0.7  PROT 5.5*  ALBUMIN 3.2*   Coagulation Profile: Recent Labs  Lab 10/23/17 0254  INR 1.10   CBG: Recent Labs  Lab 10/23/17 1221 10/23/17 2026 10/24/17 0218 10/24/17 0556 10/24/17 1145  GLUCAP 148* 178* 171* 185* 158*    Recent Results (from the past 240 hour(s))  MRSA PCR Screening     Status: None   Collection Time: 10/23/17  8:55 AM  Result Value Ref Range Status   MRSA by PCR NEGATIVE NEGATIVE Final    Comment:        The GeneXpert MRSA Assay (FDA approved for NASAL specimens only), is one component of a comprehensive MRSA colonization surveillance program. It is not intended to diagnose MRSA infection nor to guide or monitor treatment for MRSA infections.          Radiology Studies: Dg Chest 1 View  Result Date: 10/23/2017 CLINICAL DATA:  Right hip fracture tonight EXAM: CHEST 1 VIEW COMPARISON:  07/26/2017 FINDINGS: A single supine view of the chest is negative for pneumothorax or large effusion. Mediastinal contours are normal with the exception of a thoracic inlet mass corresponding to the enlarged thyroid seen on prior CT examinations dating back to at least 06/17/2015. No airspace consolidation. Normal heart size. No displaced fractures in the chest. IMPRESSION: No acute cardiopulmonary findings.  Known thyroid mass or goiter. Electronically Signed   By: Ellery Plunkaniel R Mitchell M.D.   On: 10/23/2017 03:36   Pelvis Portable  Result Date: 10/23/2017 CLINICAL DATA:  Status post right total hip  replacement. EXAM: PORTABLE PELVIS 1-2 VIEWS COMPARISON:  Intraoperative fluoroscopic images and right hip radiographs earlier today FINDINGS: Sequelae of right total hip arthroplasty are identified. The prosthetic femoral and acetabular components are approximated with one another on this single AP image. Postoperative gas is noted in the adjacent soft tissues with skin staples in place. No periprosthetic fracture is identified. Calcifications in the pelvis likely represent phleboliths. IMPRESSION: Right total hip arthroplasty without evidence of immediate postoperative complication. Electronically Signed   By: Sebastian AcheAllen  Grady M.D.   On: 10/23/2017 12:24   Dg C-arm 1-60 Min  Result Date: 10/23/2017 CLINICAL DATA:  Right hip replacement. EXAM: OPERATIVE RIGHT HIP (WITH PELVIS IF PERFORMED) 3 VIEWS TECHNIQUE: Fluoroscopic spot image(s) were submitted for interpretation post-operatively. COMPARISON:  No recent. FINDINGS: Total right hip replacement. Hardware intact. Anatomic alignment. No acute abnormality. IMPRESSION: Total right hip replacement with anatomic alignment. Electronically Signed   By: Maisie Fushomas  Register   On: 10/23/2017 12:00   Dg Hip Operative Unilat W Or W/o Pelvis Right  Result Date: 10/23/2017 CLINICAL DATA:  Right hip replacement. EXAM: OPERATIVE RIGHT HIP (WITH PELVIS IF PERFORMED) 3 VIEWS TECHNIQUE: Fluoroscopic spot image(s) were submitted for interpretation post-operatively. COMPARISON:  No recent. FINDINGS:  Total right hip replacement. Hardware intact. Anatomic alignment. No acute abnormality. IMPRESSION: Total right hip replacement with anatomic alignment. Electronically Signed   By: Maisie Fus  Register   On: 10/23/2017 12:00   Dg Hip Unilat With Pelvis 2-3 Views Right  Result Date: 10/23/2017 CLINICAL DATA:  Larey Seat while attempting to get up to the bathroom this morning. EXAM: DG HIP (WITH OR WITHOUT PELVIS) 2-3V RIGHT COMPARISON:  None. FINDINGS: There is a subcapital right hip fracture  with mild foreshortening. No dislocation. No radiographic findings to suggest a pathologic basis for the fracture. Bony pelvis is intact. Pubic symphysis and sacroiliac joints are intact. IMPRESSION: Subcapital right hip fracture. Electronically Signed   By: Ellery Plunk M.D.   On: 10/23/2017 03:31        Scheduled Meds: . aspirin EC  325 mg Oral Q breakfast  . docusate sodium  100 mg Oral BID  . feeding supplement (ENSURE ENLIVE)  237 mL Oral BID BM  . insulin aspart  0-9 Units Subcutaneous Q4H  . metoprolol tartrate  2.5 mg Intravenous Q12H   Continuous Infusions: . sodium chloride 75 mL/hr at 10/23/17 1402  . lactated ringers    . levETIRAcetam Stopped (10/24/17 1048)  . methocarbamol (ROBAXIN)  IV       LOS: 1 day     Marcellus Scott, MD, FACP, [facility]. [facility] Pager [phone]  If 7PM-7AM, please contact night-coverage [email or web address] Password [facility] 10/24/2017, 4:48 PM

## 2017-10-24 NOTE — Social Work (Addendum)
CSW met with patient briefly. CSW obtained permission from patient to f/u with family.  CSW called staff at [facility] to follow-up on patient's needs. CSW left message for Jaci Standard and will f/u.  CSW called brother and left message. CSW will f/u to discuss discharge plan.  Elissa Hefty, LCSW Clinical Social Worker [phone]

## 2017-10-25 DIAGNOSIS — D62 Acute posthemorrhagic anemia: Secondary | ICD-10-CM

## 2017-10-25 LAB — CBC
HCT: 25.9 % — ABNORMAL LOW (ref 39.0–52.0)
Hemoglobin: 8.8 g/dL — ABNORMAL LOW (ref 13.0–17.0)
MCH: 31.4 pg (ref 26.0–34.0)
MCHC: 34 g/dL (ref 30.0–36.0)
MCV: 92.5 fL (ref 78.0–100.0)
PLATELETS: 97 10*3/uL — AB (ref 150–400)
RBC: 2.8 MIL/uL — ABNORMAL LOW (ref 4.22–5.81)
RDW: 13.4 % (ref 11.5–15.5)
WBC: 12.7 10*3/uL — ABNORMAL HIGH (ref 4.0–10.5)

## 2017-10-25 LAB — GLUCOSE, CAPILLARY
Glucose-Capillary: 169 mg/dL — ABNORMAL HIGH (ref 65–99)
Glucose-Capillary: 182 mg/dL — ABNORMAL HIGH (ref 65–99)
Glucose-Capillary: 201 mg/dL — ABNORMAL HIGH (ref 65–99)

## 2017-10-25 LAB — BASIC METABOLIC PANEL
Anion gap: 10 (ref 5–15)
BUN: 23 mg/dL — ABNORMAL HIGH (ref 6–20)
CHLORIDE: 104 mmol/L (ref 101–111)
CO2: 26 mmol/L (ref 22–32)
Calcium: 8.5 mg/dL — ABNORMAL LOW (ref 8.9–10.3)
Creatinine, Ser: 1.03 mg/dL (ref 0.61–1.24)
GFR calc Af Amer: >60 mL/min (ref >60)
GLUCOSE: 175 mg/dL — AB (ref 65–99)
Potassium: 3.6 mmol/L (ref 3.5–5.1)
SODIUM: 140 mmol/L (ref 135–145)

## 2017-10-25 MED ORDER — LEVETIRACETAM 500 MG PO TABS
500.0000 mg | ORAL_TABLET | Freq: Two times a day (BID) | ORAL | Status: DC
  Administered 2017-10-25 – 2017-10-27 (×4): 500 mg via ORAL
  Filled 2017-10-25 (×4): qty 1

## 2017-10-25 NOTE — Social Work (Addendum)
CSW contacted [facility] and staff will review clinicals to see if they can take patient back at facility or he may have to got SNF.  Clydie BraunKaren, resident coordinator will get back to CSW this afternoon.  CSW sent out offers to SNFs in the area to see if they can offer a bed as a back up.  If patient goes to SNF he will need to meet medicare qualifying stay for SNF placement.  CSW will continue to follow.  Keene BreathPatricia Bharath Bernstein, LCSW Clinical Social Worker [phone]

## 2017-10-25 NOTE — Progress Notes (Signed)
OT Cancellation Note  Patient Details Name: Arthur Berry MRN: [id number] DOB: November 15, 1945   Cancelled Treatment:    Reason Eval/Treat Not Completed: Other (comment) Pt is Medicare and current D/C plan is SNF. No apparent immediate acute care OT needs, therefore will defer OT to SNF. If OT eval is needed please call Acute Rehab Dept. at [phone] or text page OT at [phone].    Arthur Berry, Arthur Berry [phone] 10/25/2017, 7:03 AM

## 2017-10-25 NOTE — Progress Notes (Signed)
Patient ID: Arthur BurySamuel W Kent, male   DOB: 10/15/1946, 71 y.o.   MRN: [id number] No acute changes.  Right hip stable.  Dressing clean and dry.  Vitals stable.  H&H stable.  Can be discharged to skilled nursing from an Ortho standpoint.

## 2017-10-25 NOTE — Progress Notes (Signed)
Physical Therapy Treatment Patient Details Name: Arthur BurySamuel W Diloreto MRN: [id number] DOB: 1946/02/12 Today's Date: 10/25/2017    History of Present Illness Admitted with R hip fracture, now s/p R THA Anterior Approach, WBAT;  has a past medical history of Adjustment reaction with aggression (05/14/2017), Arthritis, Chronic kidney disease, Colon polyp (2005), Coronary artery disease, non-occlusive, Diabetes mellitus (2001), Diverticulosis, Epilepsy (HCC), GERD (gastroesophageal reflux disease) (04/23/2014), Goiter, nontoxic, multinodular, Hyperlipidemia (1999), Hypertension (1999), Internal hemorrhoids, Neuromuscular disorder (HCC), OSA (obstructive sleep apnea), Prostate enlargement, Sleep apnea, Splinter (04/27/13), Stroke (HCC) (06/2015), and Vision changes (06/2015).    PT Comments    Pt received semirecumbent in bed, family in room, brother Avon Gully(HCPOA) just having finished feeding the patient lunch. Session is limited by increased lethargy, impaired cognition, compared to 1DA. Pt has more difficulty with simple instructions for bed exercises, and delayed response time in general. Pt maintains eyes closed throughout session. Family reports this to be not different from his baseline, but are unable to attribute this to dementia v. medications. PT asks questions about how past CVA affects mobility, but they are not supportive of any prior CVA related deficits. Pt does respond to conversation minimally, but has difficulty answering more complex questions, like when he is asked to rate his pain. Pt is participatory, but demonstrates significant weakness on the RLE in general. When asked to perform finger to nose, he demonstrate significant brady kinesia on the left UE, none on the RUE.    Follow Up Recommendations  SNF;Supervision/Assistance - 24 hour     Equipment Recommendations  Wheelchair (measurements PT);Wheelchair cushion (measurements PT)(to be determined by facility)    Recommendations for Other  Services       Precautions / Restrictions Precautions Precautions: Fall Restrictions Weight Bearing Restrictions: Yes RLE Weight Bearing: Weight bearing as tolerated    Mobility  Bed Mobility Overal bed mobility: Needs Assistance             General bed mobility comments: attempted foward flexion using BUE adn 1 person HHA, unsuccessful d/t BUE weakness. Additional mobility deferred d/t cognition/alertness.   Transfers                    Ambulation/Gait                 Stairs            Wheelchair Mobility    Modified Rankin (Stroke Patients Only)       Balance                                            Cognition Arousal/Alertness: Lethargic(eyes remain closed throughout session, despite VC for opening. Family reports as near baseline. ) Behavior During Therapy: Flat affect Overall Cognitive Status: History of cognitive impairments - at baseline Area of Impairment: Following commands;Attention                   Current Attention Level: Alternating   Following Commands: Follows one step commands inconsistently;Follows one step commands with increased time Safety/Judgement: Decreased awareness of safety;Decreased awareness of deficits   Problem Solving: Slow processing;Decreased initiation;Difficulty sequencing;Requires verbal cues;Requires tactile cues        Exercises Total Joint Exercises Short Arc Quad: AAROM;Both;10 reps(poor activation on Right side) Heel Slides: AAROM;Both;15 reps(heavy verbal cues required, better strength on left, but rigid without heavy VC for participation) Hip  ABduction/ADduction: AAROM;Both;15 reps(poor activation on right)    General Comments        Pertinent Vitals/Pain Pain Assessment: 0-10 Pain Score: 10-Worst pain ever Pain Location: Rt hip surgical site during BLE exercises at end range, otherwise pain appears manageble at rest.  Pain Intervention(s): Limited activity  within patient's tolerance;Monitored during session    Home Living Family/patient expects to be discharged to:: Skilled nursing facility               Additional Comments: Pt is from ALF [facility]     Prior Function Level of Independence: (Pt's brother is POA, reports long residence in ALF, intermittent household distance AMB with RW, with more shuffling lately. Baseliine lethargy per brother. )      Comments: Pt unable to give information re: PLOF   PT Goals (current goals can now be found in the care plan section) Acute Rehab PT Goals Patient Stated Goal: return to supervised AMB  PT Goal Formulation: With family Time For Goal Achievement: 11/07/17 Potential to Achieve Goals: Fair Progress towards PT goals: Not progressing toward goals - comment(pt more lethargic this session, canoot keep eyes open, difficulty following VC and simple instructions)    Frequency    Min 3X/week      PT Plan Current plan remains appropriate    Co-evaluation              AM-PAC PT "6 Clicks" Daily Activity  Outcome Measure  Difficulty turning over in bed (including adjusting bedclothes, sheets and blankets)?: Unable Difficulty moving from lying on back to sitting on the side of the bed? : Unable Difficulty sitting down on and standing up from a chair with arms (e.g., wheelchair, bedside commode, etc,.)?: Unable Help needed moving to and from a bed to chair (including a wheelchair)?: Total Help needed walking in hospital room?: Total Help needed climbing 3-5 steps with a railing? : Total 6 Click Score: 6    End of Session   Activity Tolerance: Patient tolerated treatment well;Patient limited by lethargy Patient left: in bed;with call bell/phone within reach;with bed alarm set;with nursing/sitter in room;with family/visitor present Nurse Communication: Mobility status PT Visit Diagnosis: Unsteadiness on feet (R26.81);Other abnormalities of gait and mobility  (R26.89);Pain;Other symptoms and signs involving the nervous system (R29.898);Hemiplegia and hemiparesis Hemiplegia - Right/Left: Right Hemiplegia - dominant/non-dominant: Non-dominant Hemiplegia - caused by: Cerebral infarction Pain - Right/Left: Right Pain - part of body: Hip     Time: 1500-1533 PT Time Calculation (min) (ACUTE ONLY): 33 min  Charges:  $Therapeutic Exercise: 8-22 mins $Therapeutic Activity: 8-22 mins                    G Codes:      3:54 PM, 10/25/17 Rosamaria LintsAllan C Buccola, PT, DPT Relief Physical Therapist - [place] [phone] (Pager)  [phone] (Mobile)  [phone] (Office)      Buccola,Allan C 10/25/2017, 3:48 PM

## 2017-10-25 NOTE — Social Work (Addendum)
CSW called family to discuss SNF bed offers. CSW left message and will f/u.  CS[facility] spoke with [facility] and confirmed that they will not be able to provide skilled nursing at facility.  CSW will f/u for SNF placement.  Keene BreathPatricia Nathaniel Wakeley, LCSW Clinical Social Worker [phone]

## 2017-10-25 NOTE — Clinical Social Work Note (Signed)
Clinical Social Work Assessment  Patient Details  Name: Arthur Berry MRN: [id number] Date of Birth: Sep 11, 1946  Date of referral:  10/25/17               Reason for consult:  Facility Placement                Permission sought to share information with:  Oceanographeracility Contact Representative Permission granted to share information::  Yes, Verbal Permission Granted  Name::     Dispensing opticianHarvey Berry  Agency::  SNF  Relationship::     Contact Information:     Housing/Transportation Living arrangements for the past 2 months:  Assisted Living Facility Source of Information:  Patient, Other (Comment Required)(Brother Arthur NimrodHarvey) Patient Interpreter Needed:  None Criminal Activity/Legal Involvement Pertinent to Current Situation/Hospitalization:  No - Comment as needed Significant Relationships:  Siblings, Other Family Members Lives with:  Facility Resident Do you feel safe going back to the place where you live?  Yes Need for family participation in patient care:  Yes (Comment)  Care giving concerns:  [facility]. Pt plans is to return to facility for short term rehab. CSW spoke with patient and son and confirmed. CSW will continue to follow [facility] to confirm that patient can return for short term rehab. CSW sent clinical notes for review.   Social Worker assessment / plan:  CSW discussed with patient and the brother the plan for short term rehab. CSW will f/u on SNF placement as it appears patient may be able to return to facility and receive therapy in house.   CSW will continue to follow for disposition.  Employment status:  Retired Database administratornsurance information:  Managed Medicare PT Recommendations:  Skilled Nursing Facility Information / Referral to community resources:  Skilled Nursing Facility  Patient/Family's Response to care:  Patient/family verbalized appreciation and agreement with the plan for short [facility] or another SNF. No other issues identified with care.  Patient/Family's Understanding of and Emotional Response to Diagnosis, Current Treatment, and Prognosis:  Patient/family are hopeful that patient will be able to [facility] as patient resides there and familiar with setting. However if patient needs another SNF family accepts and understands that option as well. No other issues or concerns identified.  Emotional Assessment Appearance:  Appears stated age Attitude/Demeanor/Rapport:  (Cooperative) Affect (typically observed):  Accepting, Appropriate Orientation:  Oriented to Situation, Oriented to  Time, Oriented to Place, Oriented to Self Alcohol / Substance use:  Not Applicable Psych involvement (Current and /or in the community):  No (Comment)  Discharge Needs  Concerns to be addressed:  Discharge Planning Concerns Readmission within the last 30 days:  No Current discharge risk:  Physical Impairment, Dependent with Mobility Barriers to Discharge:  No Barriers Identified   Tresa Mooreatricia V Edu On, LCSW 10/25/2017, 10:38 AM

## 2017-10-25 NOTE — Progress Notes (Signed)
PROGRESS NOTE   Arthur Berry  ZOX:[id number]    DOB: 1946-05-23    DOA: 10/23/2017  PCP: Rodrigo RanPerini, Mark, MD   I have briefly reviewed patients previous medical [facility].  Brief Narrative:  71 year old male with PMH of HTN, epilepsy, DM, dementia presented to ED following a mechanical fall without report of hitting his head or losing consciousness. X-rays revealed right hip fracture. Orthopedics performed right total hip arthroplasty on 12/11. Postop course complicated by altered mental status, now resolved. DC to SNF possibly 12/14.   Assessment & Plan:   Principal Problem:   Closed right hip fracture, initial encounter [facility](HCC) Active Problems:   Essential hypertension, benign   Epilepsy (HCC)   Type 2 diabetes mellitus with other circulatory complications (HCC)   Thrombocytopenia (HCC)   Closed subcapital fracture of right femur (HCC)   1. Right hip fracture status post total hip arthroplasty anterior approach 12/11: Orthopedics follow-up appreciated. I discussed with Dr. Magnus IvanBlackman. Aspirin for DVT prophylaxis, no dressing change in mental follow up in office in 2 weeks, weightbearing as tolerated. 2. Seizure disorder: Was on Dilantin and Keppra PTA. Presented with Dilantin level 23. His case was discussed by admitting physician with Neuro hospitalist/Dr. Wilford CornerArora who recommended holding Dilantin for 24 hours and rechecking levels. Keppra was continued. Repeat Dilantin level 15.2 on 12/12. Resume Dilantin per pharmacy >was on 200 MG daily PTA, now restarted at 150 MG daily. Check Dilantin level in 5-7 days. 3. Essential hypertension: Controlled. Resumed home dose of amlodipine 2.5 MG daily and carvedilol 3.125 MG twice daily. Temporarily hold lisinopril, may resume at discharge. 4. Type II DM: Mildly uncontrolled and fluctuating. SSI. Hold metformin and Lantus that he was getting 5 units daily PTA which can be resumed at discharge. 5. Acute blood loss anemia: Follow CBCs and  transfuse if hemoglobin <7 g per DL. No overt bleeding. 6. Thrombocytopenia: Appears chronic. Stable. Follow CBCs. 7. Goiter: Seen on x-ray. Outpatient workup as deemed necessary. 8. Depression: Resumed home dose of Lexapro 10 MG daily. Stable. 9. Dementia with behavioral abnormalities: Resumed home dose of galantamine,bedtime Risperdal, Ativan and Remeron. As per sister, patient has periods of agitation. Mental status/agitation improved. 10. Hyperlipidemia: Continue statins. 11. Hypokalemia: Replaced.   DVT prophylaxis: SCDs and aspirin. Code Status: Full Family Communication: None at bedside today. Disposition: DC to SNF possibly 11/14.   Consultants:  Orthopedics   Procedures:  Right hip total arthroplasty anterior approach 12/11  Antimicrobials:  None    Subjective: Patient oriented to self and place. Denies complaints. Actually did not complain of pain until he was asked to move his right lower extremity when he reported some pain. As per RN, has done quite well since last night without issues with agitation.  ROS: As above  Objective:  Vitals:   10/24/17 0558 10/24/17 1357 10/24/17 2133 10/25/17 0500  BP: (!) 108/52 (!) 109/53 113/65 129/60  Pulse: 96 87 80 81  Resp: 16 (!) 22 18 16   Temp: 98.4 F (36.9 C) 98.4 F (36.9 C) 99 F (37.2 C) 98.6 F (37 C)  TempSrc: Oral Oral Oral Oral  SpO2: 97% 94% 95% 96%  Weight:      Height:        Examination:  General exam: Elderly male, moderately built and nourished lying comfortably supine in bed. Does not appear in any distress. Respiratory system: Clear to auscultation. Respiratory effort normal. Stable without change. Cardiovascular system: S1 & S2 heard, RRR. No JVD, murmurs,  rubs, gallops or clicks. No pedal edema. Telemetry personally reviewed: Sinus rhythm and QTC on telemetry: 390 ms. Gastrointestinal system: Abdomen is nondistended, soft and nontender. No organomegaly or masses felt. Normal bowel sounds heard.  Stable. Central nervous system: Mental status as discussed above. No focal neurological deficits. Extremities: Moves bilateral upper extremities and left lower extremity well. Right hip postop dressing clean and dry. Right hip movements limited secondary to pain. Skin: No rashes, lesions or ulcers Psychiatry: Judgement and insight appear impaired. Mood & affect appropriate.     Data Reviewed: I have personally reviewed following labs and imaging studies  CBC: Recent Labs  Lab 10/23/17 0254 10/24/17 0452 10/25/17 0543  WBC 11.3* 11.5* 12.7*  NEUTROABS 7.9*  --   --   HGB 12.5* 9.6* 8.8*  HCT 36.3* 28.4* 25.9*  MCV 92.6 91.9 92.5  PLT 122* 100* 97*   Basic Metabolic Panel: Recent Labs  Lab 10/23/17 0254 10/24/17 0452 10/25/17 0543  NA 141 139 140  K 4.1 3.3* 3.6  CL 102 103 104  CO2 30 26 26   GLUCOSE 141* 180* 175*  BUN 30* 19 23*  CREATININE 1.12 1.00 1.03  CALCIUM 9.2 8.3* 8.5*   Liver Function Tests: Recent Labs  Lab 10/24/17 0452  AST 24  ALT 15*  ALKPHOS 112  BILITOT 0.7  PROT 5.5*  ALBUMIN 3.2*   Coagulation Profile: Recent Labs  Lab 10/23/17 0254  INR 1.10   CBG: Recent Labs  Lab 10/24/17 0556 10/24/17 1145 10/24/17 1643 10/24/17 2131 10/25/17 1207  GLUCAP 185* 158* 193* 147* 169*    Recent Results (from the past 240 hour(s))  MRSA PCR Screening     Status: None   Collection Time: 10/23/17  8:55 AM  Result Value Ref Range Status   MRSA by PCR NEGATIVE NEGATIVE Final    Comment:        The GeneXpert MRSA Assay (FDA approved for NASAL specimens only), is one component of a comprehensive MRSA colonization surveillance program. It is not intended to diagnose MRSA infection nor to guide or monitor treatment for MRSA infections.          Radiology Studies: No results found.      Scheduled Meds: . amLODipine  2.5 mg Oral Daily  . aspirin EC  325 mg Oral Q breakfast  . atorvastatin  40 mg Oral q1800  . carvedilol  3.125  mg Oral BID WC  . cholecalciferol  5,000 Units Oral Daily  . docusate sodium  100 mg Oral BID  . escitalopram  10 mg Oral Daily  . feeding supplement (ENSURE ENLIVE)  237 mL Oral BID BM  . galantamine  16 mg Oral Q breakfast  . insulin aspart  0-5 Units Subcutaneous QHS  . insulin aspart  0-9 Units Subcutaneous TID WC  . LORazepam  0.5 mg Oral QHS  . magnesium oxide  400 mg Oral Daily  . Melatonin  3 mg Oral QHS  . mirtazapine  30 mg Oral QHS  . pantoprazole  40 mg Oral Daily  . phenytoin  150 mg Oral Daily  . risperiDONE  2 mg Oral QHS   Continuous Infusions: . levETIRAcetam Stopped (10/25/17 1041)  . methocarbamol (ROBAXIN)  IV       LOS: 2 days     Marcellus ScottAnand Azariah Bonura, MD, FACP, [facility]. [facility] Pager [phone]  If 7PM-7AM, please contact night-coverage [email or web address] Password [email or web address] 10/25/2017, 3:19 PM

## 2017-10-26 LAB — BPAM RBC
BLOOD PRODUCT EXPIRATION DATE: 201812212359
Unit Type and Rh: 600

## 2017-10-26 LAB — TYPE AND SCREEN
ABO/RH(D): A POS
ANTIBODY SCREEN: NEGATIVE
UNIT DIVISION: 0

## 2017-10-26 LAB — PHENYTOIN LEVEL, FREE AND TOTAL
PHENYTOIN, TOTAL: 16.4 ug/mL (ref 10.0–20.0)
Phenytoin, Free: 2.1 ug/mL — ABNORMAL HIGH (ref 1.0–2.0)

## 2017-10-26 LAB — CBC
HEMATOCRIT: 22.3 % — AB (ref 39.0–52.0)
HEMOGLOBIN: 7.6 g/dL — AB (ref 13.0–17.0)
MCH: 31.7 pg (ref 26.0–34.0)
MCHC: 34.1 g/dL (ref 30.0–36.0)
MCV: 92.9 fL (ref 78.0–100.0)
Platelets: 97 10*3/uL — ABNORMAL LOW (ref 150–400)
RBC: 2.4 MIL/uL — AB (ref 4.22–5.81)
RDW: 13.3 % (ref 11.5–15.5)
WBC: 9.9 10*3/uL (ref 4.0–10.5)

## 2017-10-26 LAB — GLUCOSE, CAPILLARY
GLUCOSE-CAPILLARY: 161 mg/dL — AB (ref 65–99)
Glucose-Capillary: 158 mg/dL — ABNORMAL HIGH (ref 65–99)
Glucose-Capillary: 173 mg/dL — ABNORMAL HIGH (ref 65–99)
Glucose-Capillary: 109 mg/dL — ABNORMAL HIGH (ref 65–99)

## 2017-10-26 LAB — PREPARE RBC (CROSSMATCH)

## 2017-10-26 MED ORDER — SODIUM CHLORIDE 0.9 % IV SOLN
Freq: Once | INTRAVENOUS | Status: AC
  Administered 2017-10-26: 17:00:00 via INTRAVENOUS

## 2017-10-26 NOTE — Care Management Important Message (Signed)
Important Message  Patient Details  Name: Ivar BurySamuel W Everetts MRN: [id number] Date of Birth: [birth year]   Medicare Important Message Given:  Yes    Dennisha Mouser 10/26/2017, 2:00 PM

## 2017-10-26 NOTE — Social Work (Signed)
CSW met with family again and they have accepted bed offer from [facility].  CSW confirmed bed placement with SNF.  CSW f/u for discharge.  Elissa Hefty, LCSW Clinical Social Worker [phone]

## 2017-10-26 NOTE — Progress Notes (Signed)
PROGRESS NOTE   Arthur Berry  [id number]    DOB: 07-17-1946    DOA: 10/23/2017  PCP: Rodrigo RanPerini, Mark, MD   I have briefly reviewed patients [facility].  Brief Narrative:  71 year old male with PMH of HTN, epilepsy, DM, dementia presented to ED following a mechanical fall without report of hitting his head or losing consciousness. X-rays revealed right hip fracture. Orthopedics performed right total hip arthroplasty on 12/11. Postop course complicated by altered mental status, now intermittent and as per discussion with family at his baseline. Also postop ABLA and transfusing 1 unit PRBC 12/14. Possible DC to SNF 12/15.   Assessment & Plan:   Principal Problem:   Closed right hip fracture, initial encounter [facility](HCC) Active Problems:   Essential hypertension, benign   Epilepsy (HCC)   Type 2 diabetes mellitus with other circulatory complications (HCC)   Thrombocytopenia (HCC)   Closed subcapital fracture of right femur (HCC)   1. Right hip fracture status post total hip arthroplasty anterior approach 12/11: Orthopedics follow-up appreciated. I discussed with Dr. Magnus IvanBlackman. Aspirin for DVT prophylaxis, no dressing change until follow up in office in 2 weeks, weightbearing as tolerated. 2. Seizure disorder: Was on Dilantin and Keppra PTA. Presented with Dilantin level 23. His case was discussed by admitting physician with Neuro hospitalist/Dr. Wilford CornerArora who recommended holding Dilantin for 24 hours and rechecking levels. Keppra was continued. Repeat Dilantin level 15.2 on 12/12. Resumed Dilantin per pharmacy >was on 200 MG daily PTA, now restarted at 150 MG daily. Check Dilantin level in 5-7 days. As per discussion with patient's brother, he has had seizures since age 89 and has been on Dilantin since then and has been on Keppra also for a long time. Recommend outpatient neurology consultation and follow-up. 3. Essential hypertension: Controlled. Resumed home dose of  amlodipine 2.5 MG daily and carvedilol 3.125 MG twice daily. Temporarily hold lisinopril, may resume at SNF depending on how his blood pressures do. 4. Type II DM: Mildly uncontrolled and fluctuating. SSI. Hold metformin and Lantus that he was getting 5 units daily PTA which can be resumed at discharge. No change in plans. 5. Acute blood loss anemia: Hemoglobin has dropped from 12.5 on 12/11-7.6 on 12/14. Postoperative blood loss. No overt signs of bleeding. Discussed with Dr. Magnus IvanBlackman and will transfuse 1 unit PRBC, monitor overnight, follow CBC in a.m. and if stable then DC to SNF. 6. Thrombocytopenia: Appears chronic. Stable. Follow CBCs. 7. Goiter: Seen on x-ray. Outpatient workup as deemed necessary. 8. Depression: Resumed home dose of Lexapro 10 MG daily. Stable. 9. Dementia with behavioral abnormalities: Resumed home dose of galantamine,bedtime Risperdal, Ativan and Remeron. As per patient's family at bedside, current mental status is at his baseline. 10. Hyperlipidemia: Continue statins. 11. Hypokalemia: Replaced.   DVT prophylaxis: SCDs and aspirin. Code Status: Full Family Communication: None at bedside today. Disposition: DC to SNF possibly 11/15.   Consultants:  Orthopedics   Procedures:  Right hip total arthroplasty anterior approach 12/11  Antimicrobials:  None    Subjective: Patient sleeping but easily arousable and oriented to self. "I have been hit by a train truck" but does not elaborate. Patient's brother and sister-in-law are at bedside who indicated that his current mental status is at baseline which includes sleeping a lot during the daytime, intermittent agitation. He has been epileptic since age 89.  ROS: As above  Objective:  Vitals:   10/25/17 0500 10/25/17 1500 10/25/17 2121 10/26/17 0507  BP: 129/60  116/60 (!) 127/56 (!) 108/50  Pulse: 81 77 86 80  Resp: 16 18 15 12   Temp: 98.6 F (37 C) 98 F (36.7 C) 99.4 F (37.4 C) 98.1 F (36.7 C)  TempSrc:  Oral Oral Axillary Axillary  SpO2: 96% 96% 95% 98%  Weight:      Height:        Examination:  General exam: Elderly male, moderately built and nourished lying comfortably supine in bed. Does not appear in any distress. Stable. Respiratory system: Clear to auscultation. Respiratory effort normal. Stable. Cardiovascular system: S1 & S2 heard, RRR. No JVD, murmurs, rubs, gallops or clicks. No pedal edema. Stable without change. Gastrointestinal system: Abdomen is nondistended, soft and nontender. No organomegaly or masses felt. Normal bowel sounds heard. Stable. Central nervous system: Sleeping but easily arousable. Likes to keep his eyes closed. Oriented to self. Follow simple instructions. No focal neurological deficits. Extremities: Moves bilateral upper extremities and left lower extremity well. Right hip postop dressing clean and dry. Right hip movements limited secondary to pain. No active bleeding at surgical site. Stable. Skin: No rashes, lesions or ulcers Psychiatry: Judgement and insight chronically impaired. Mood & affect flat.     Data Reviewed: I have personally reviewed following labs and imaging studies  CBC: Recent Labs  Lab 10/23/17 0254 10/24/17 0452 10/25/17 0543 10/26/17 0639  WBC 11.3* 11.5* 12.7* 9.9  NEUTROABS 7.9*  --   --   --   HGB 12.5* 9.6* 8.8* 7.6*  HCT 36.3* 28.4* 25.9* 22.3*  MCV 92.6 91.9 92.5 92.9  PLT 122* 100* 97* 97*   Basic Metabolic Panel: Recent Labs  Lab 10/23/17 0254 10/24/17 0452 10/25/17 0543  NA 141 139 140  K 4.1 3.3* 3.6  CL 102 103 104  CO2 30 26 26   GLUCOSE 141* 180* 175*  BUN 30* 19 23*  CREATININE 1.12 1.00 1.03  CALCIUM 9.2 8.3* 8.5*   Liver Function Tests: Recent Labs  Lab 10/24/17 0452  AST 24  ALT 15*  ALKPHOS 112  BILITOT 0.7  PROT 5.5*  ALBUMIN 3.2*   Coagulation Profile: Recent Labs  Lab 10/23/17 0254  INR 1.10   CBG: Recent Labs  Lab 10/25/17 1207 10/25/17 1645 10/25/17 2123 10/26/17 0659  10/26/17 1215  GLUCAP 169* 182* 201* 158* 173*    Recent Results (from the past 240 hour(s))  MRSA PCR Screening     Status: None   Collection Time: 10/23/17  8:55 AM  Result Value Ref Range Status   MRSA by PCR NEGATIVE NEGATIVE Final    Comment:        The GeneXpert MRSA Assay (FDA approved for NASAL specimens only), is one component of a comprehensive MRSA colonization surveillance program. It is not intended to diagnose MRSA infection nor to guide or monitor treatment for MRSA infections.          Radiology Studies: No results found.      Scheduled Meds: . amLODipine  2.5 mg Oral Daily  . aspirin EC  325 mg Oral Q breakfast  . atorvastatin  40 mg Oral q1800  . carvedilol  3.125 mg Oral BID WC  . cholecalciferol  5,000 Units Oral Daily  . docusate sodium  100 mg Oral BID  . escitalopram  10 mg Oral Daily  . feeding supplement (ENSURE ENLIVE)  237 mL Oral BID BM  . galantamine  16 mg Oral Q breakfast  . insulin aspart  0-5 Units Subcutaneous QHS  . insulin aspart  0-9 Units Subcutaneous TID WC  . levETIRAcetam  500 mg Oral BID  . LORazepam  0.5 mg Oral QHS  . magnesium oxide  400 mg Oral Daily  . Melatonin  3 mg Oral QHS  . mirtazapine  30 mg Oral QHS  . pantoprazole  40 mg Oral Daily  . phenytoin  150 mg Oral Daily  . risperiDONE  2 mg Oral QHS   Continuous Infusions: . sodium chloride    . methocarbamol (ROBAXIN)  IV       LOS: 3 days     Marcellus Scott, MD, FACP, [facility] Pager [phone]  If 7PM-7AM, please contact night-coverage [email or web address] Password [facility] 10/26/2017, 12:25 PM

## 2017-10-26 NOTE — [place] FL2 (Signed)
[place] MEDICAID FL2 LEVEL OF CARE SCREENING TOOL     IDENTIFICATION  Patient Name: Arthur Berry Birthdate: 1945/11/27 Sex: male Admission Date (Current Location): 10/23/2017  [facility] [place]   Facility and Address:  [facility], [address], [place], [place] [address]      Provider Number: [id number]  Attending Physician Name and Address:  Elease EtienneHongalgi, Anand D, MD  Relative Name and Phone Number:  Lorella NimrodHarvey, brother, [phone] & Dennie BiblePat sister-n-law    Current Level of Care: Hospital Recommended Level of Care: Assisted Living Facility, Skilled Nursing Facility Prior Approval Number:    Date Approved/Denied:   PASRR Number: [id number]  Discharge Plan: SNF    Current Diagnoses: Patient Active Problem List   Diagnosis Date Noted  . Closed right hip fracture, initial encounter (HCC) 10/23/2017  . Closed subcapital fracture of right femur (HCC)   . Adjustment reaction with aggression 05/14/2017  . Type 2 diabetes mellitus with other circulatory complications (HCC) 06/19/2015  . Thrombocytopenia (HCC) 06/19/2015  . Cerebral infarction due to unspecified mechanism   . Dilantin toxicity 06/18/2015  . Phenytoin toxicity   . Stroke (HCC) 06/16/2015  . Fall 06/16/2015  . Epilepsy (HCC) 06/16/2015  . BPH (benign prostatic hyperplasia) 06/16/2015  . Leukocytosis 06/16/2015  . CVA (cerebral infarction) 06/16/2015  . CVA (cerebral vascular accident) (HCC) 06/15/2015  . GERD (gastroesophageal reflux disease) 04/23/2014  . Potassium (K) deficiency 12/11/2012  . Precordial pain 12/11/2012  . Dehydration 04/15/2012  . AKI (acute kidney injury) (HCC) 04/14/2012  . HLD (hyperlipidemia) 06/01/2010  . Essential hypertension, benign 06/01/2010  . DYSPNEA 06/01/2010  . Diabetes mellitus without complication (HCC) 11/04/2009  . RECTAL BLEEDING 11/04/2009  . PERSONAL HX COLONIC POLYPS 11/04/2009    Orientation RESPIRATION BLADDER  Height & Weight     Self, Place  Normal Incontinent, External catheter Weight: 175 lb (79.4 kg) Height:  5\' 9"  (175.3 cm)  BEHAVIORAL SYMPTOMS/MOOD NEUROLOGICAL BOWEL NUTRITION STATUS      Continent Diet(See DC Summary)  AMBULATORY STATUS COMMUNICATION OF NEEDS Skin   Extensive Assist Verbally Surgical wounds                       Personal Care Assistance Level of Assistance  Dressing, Bathing, Feeding Bathing Assistance: Maximum assistance Feeding assistance: Limited assistance Dressing Assistance: Maximum assistance     Functional Limitations Info  Sight, Hearing, Speech Sight Info: Impaired Hearing Info: Adequate Speech Info: Adequate    SPECIAL CARE FACTORS FREQUENCY  PT (By licensed PT)     PT Frequency: 5x week              Contractures Contractures Info: Not present    Additional Factors Info  Code Status, Allergies Code Status Info: Full Allergies Info: No Known Allergies           Current Medications (10/26/2017):  This is the current hospital active medication list Current Facility-Administered Medications  Medication Dose Route Frequency Provider Last Rate Last Dose  . acetaminophen (TYLENOL) tablet 650 mg  650 mg Oral Q6H PRN Kathryne HitchBlackman, Christopher Y, MD       Or  . acetaminophen (TYLENOL) suppository 650 mg  650 mg Rectal Q6H PRN Kathryne HitchBlackman, Christopher Y, MD      . amLODipine (NORVASC) tablet 2.5 mg  2.5 mg Oral Daily Marcellus ScottHongalgi, Anand D, MD   2.5 mg at 10/25/17 1031  . aspirin EC tablet 325 mg  325 mg Oral  Q breakfast Kathryne HitchBlackman, Christopher Y, MD   325 mg at 10/26/17 1028  . atorvastatin (LIPITOR) tablet 40 mg  40 mg Oral q1800 Elease EtienneHongalgi, Anand D, MD   40 mg at 10/25/17 1727  . carvedilol (COREG) tablet 3.125 mg  3.125 mg Oral BID WC Hongalgi, Anand D, MD   3.125 mg at 10/26/17 1030  . cholecalciferol (VITAMIN D) tablet 5,000 Units  5,000 Units Oral Daily Elease EtienneHongalgi, Anand D, MD   5,000 Units at 10/26/17 1026  . docusate sodium (COLACE) capsule 100  mg  100 mg Oral BID Kathryne HitchBlackman, Christopher Y, MD   100 mg at 10/26/17 1030  . escitalopram (LEXAPRO) tablet 10 mg  10 mg Oral Daily Elease EtienneHongalgi, Anand D, MD   10 mg at 10/26/17 1026  . feeding supplement (ENSURE ENLIVE) (ENSURE ENLIVE) liquid 237 mL  237 mL Oral BID BM Hongalgi, Anand D, MD   237 mL at 10/26/17 1029  . galantamine (RAZADYNE ER) 24 hr capsule 16 mg  16 mg Oral Q breakfast Hongalgi, Anand D, MD   16 mg at 10/26/17 1026  . hydrALAZINE (APRESOLINE) injection 10 mg  10 mg Intravenous Q4H PRN Eduard ClosKakrakandy, Arshad N, MD      . HYDROcodone-acetaminophen (NORCO/VICODIN) 5-325 MG per tablet 1-2 tablet  1-2 tablet Oral Q6H PRN Kathryne HitchBlackman, Christopher Y, MD   1 tablet at 10/26/17 1026  . insulin aspart (novoLOG) injection 0-5 Units  0-5 Units Subcutaneous QHS Elease EtienneHongalgi, Anand D, MD   2 Units at 10/25/17 2304  . insulin aspart (novoLOG) injection 0-9 Units  0-9 Units Subcutaneous TID WC Elease EtienneHongalgi, Anand D, MD   2 Units at 10/26/17 0800  . levETIRAcetam (KEPPRA) tablet 500 mg  500 mg Oral BID Elease EtienneHongalgi, Anand D, MD   500 mg at 10/26/17 1025  . LORazepam (ATIVAN) tablet 0.5 mg  0.5 mg Oral QHS Hongalgi, Anand D, MD   0.5 mg at 10/25/17 1842  . magnesium oxide (MAG-OX) tablet 400 mg  400 mg Oral Daily Elease EtienneHongalgi, Anand D, MD   400 mg at 10/26/17 1029  . Melatonin TABS 3 mg  3 mg Oral QHS Hongalgi, Maximino GreenlandAnand D, MD   3 mg at 10/25/17 2258  . menthol-cetylpyridinium (CEPACOL) lozenge 3 mg  1 lozenge Oral PRN Kathryne HitchBlackman, Christopher Y, MD       Or  . phenol (CHLORASEPTIC) mouth spray 1 spray  1 spray Mouth/Throat PRN Kathryne HitchBlackman, Christopher Y, MD      . methocarbamol (ROBAXIN) tablet 500 mg  500 mg Oral Q6H PRN Kathryne HitchBlackman, Christopher Y, MD   500 mg at 10/25/17 1727   Or  . methocarbamol (ROBAXIN) 500 mg in dextrose 5 % 50 mL IVPB  500 mg Intravenous Q6H PRN Kathryne HitchBlackman, Christopher Y, MD      . mirtazapine (REMERON) tablet 30 mg  30 mg Oral QHS Elease EtienneHongalgi, Anand D, MD   30 mg at 10/25/17 2259  . morphine 2 MG/ML injection 0.5  mg  0.5 mg Intravenous Q2H PRN Eduard ClosKakrakandy, Arshad N, MD   0.5 mg at 10/25/17 1726  . ondansetron (ZOFRAN) tablet 4 mg  4 mg Oral Q6H PRN Kathryne HitchBlackman, Christopher Y, MD       Or  . ondansetron [facility](ZOFRAN) injection 4 mg  4 mg Intravenous Q6H PRN Kathryne HitchBlackman, Christopher Y, MD      . pantoprazole (PROTONIX) EC tablet 40 mg  40 mg Oral Daily Elease EtienneHongalgi, Anand D, MD   40 mg at 10/26/17 1026  . phenytoin (DILANTIN) chewable tablet 150 mg  150 mg Oral Daily Elease Etienne, MD   150 mg at 10/26/17 1025  . risperiDONE (RISPERDAL) tablet 2 mg  2 mg Oral QHS Elease Etienne, MD   2 mg at 10/25/17 2258     Discharge Medications: Please see discharge summary for a list of discharge medications.  Relevant Imaging Results:  Relevant Lab Results:   Additional Information SS#: [id number], LCSW

## 2017-10-26 NOTE — Social Work (Signed)
CSW met with family at bedside. CSW provided SNF offers.  CSW f/u as patient dc today.  Elissa Hefty, LCSW Clinical Social Worker [phone]

## 2017-10-27 DIAGNOSIS — S72009A Fracture of unspecified part of neck of unspecified femur, initial encounter for closed fracture: Secondary | ICD-10-CM | POA: Diagnosis not present

## 2017-10-27 DIAGNOSIS — S72011D Unspecified intracapsular fracture of right femur, subsequent encounter for closed fracture with routine healing: Secondary | ICD-10-CM | POA: Diagnosis not present

## 2017-10-27 DIAGNOSIS — R278 Other lack of coordination: Secondary | ICD-10-CM | POA: Diagnosis not present

## 2017-10-27 DIAGNOSIS — F445 Conversion disorder with seizures or convulsions: Secondary | ICD-10-CM | POA: Diagnosis not present

## 2017-10-27 DIAGNOSIS — K922 Gastrointestinal hemorrhage, unspecified: Secondary | ICD-10-CM | POA: Diagnosis not present

## 2017-10-27 DIAGNOSIS — I10 Essential (primary) hypertension: Secondary | ICD-10-CM | POA: Diagnosis not present

## 2017-10-27 DIAGNOSIS — R319 Hematuria, unspecified: Secondary | ICD-10-CM | POA: Diagnosis not present

## 2017-10-27 DIAGNOSIS — G4700 Insomnia, unspecified: Secondary | ICD-10-CM | POA: Diagnosis not present

## 2017-10-27 DIAGNOSIS — G8911 Acute pain due to trauma: Secondary | ICD-10-CM | POA: Diagnosis not present

## 2017-10-27 DIAGNOSIS — Z111 Encounter for screening for respiratory tuberculosis: Secondary | ICD-10-CM | POA: Diagnosis not present

## 2017-10-27 DIAGNOSIS — E1159 Type 2 diabetes mellitus with other circulatory complications: Secondary | ICD-10-CM | POA: Diagnosis not present

## 2017-10-27 DIAGNOSIS — F329 Major depressive disorder, single episode, unspecified: Secondary | ICD-10-CM | POA: Diagnosis not present

## 2017-10-27 DIAGNOSIS — N390 Urinary tract infection, site not specified: Secondary | ICD-10-CM | POA: Diagnosis not present

## 2017-10-27 DIAGNOSIS — M6281 Muscle weakness (generalized): Secondary | ICD-10-CM | POA: Diagnosis not present

## 2017-10-27 DIAGNOSIS — Z96641 Presence of right artificial hip joint: Secondary | ICD-10-CM | POA: Diagnosis not present

## 2017-10-27 DIAGNOSIS — E119 Type 2 diabetes mellitus without complications: Secondary | ICD-10-CM | POA: Diagnosis not present

## 2017-10-27 DIAGNOSIS — R52 Pain, unspecified: Secondary | ICD-10-CM | POA: Diagnosis not present

## 2017-10-27 DIAGNOSIS — G40911 Epilepsy, unspecified, intractable, with status epilepticus: Secondary | ICD-10-CM | POA: Diagnosis not present

## 2017-10-27 DIAGNOSIS — G40409 Other generalized epilepsy and epileptic syndromes, not intractable, without status epilepticus: Secondary | ICD-10-CM | POA: Diagnosis not present

## 2017-10-27 DIAGNOSIS — K219 Gastro-esophageal reflux disease without esophagitis: Secondary | ICD-10-CM | POA: Diagnosis not present

## 2017-10-27 DIAGNOSIS — E785 Hyperlipidemia, unspecified: Secondary | ICD-10-CM | POA: Diagnosis not present

## 2017-10-27 DIAGNOSIS — E876 Hypokalemia: Secondary | ICD-10-CM | POA: Diagnosis not present

## 2017-10-27 DIAGNOSIS — D649 Anemia, unspecified: Secondary | ICD-10-CM | POA: Diagnosis not present

## 2017-10-27 DIAGNOSIS — D62 Acute posthemorrhagic anemia: Secondary | ICD-10-CM | POA: Diagnosis not present

## 2017-10-27 DIAGNOSIS — E048 Other specified nontoxic goiter: Secondary | ICD-10-CM | POA: Diagnosis not present

## 2017-10-27 DIAGNOSIS — K5900 Constipation, unspecified: Secondary | ICD-10-CM | POA: Diagnosis not present

## 2017-10-27 DIAGNOSIS — Z471 Aftercare following joint replacement surgery: Secondary | ICD-10-CM | POA: Diagnosis not present

## 2017-10-27 DIAGNOSIS — R339 Retention of urine, unspecified: Secondary | ICD-10-CM | POA: Diagnosis not present

## 2017-10-27 DIAGNOSIS — Z9181 History of falling: Secondary | ICD-10-CM | POA: Diagnosis not present

## 2017-10-27 DIAGNOSIS — D696 Thrombocytopenia, unspecified: Secondary | ICD-10-CM | POA: Diagnosis not present

## 2017-10-27 DIAGNOSIS — E569 Vitamin deficiency, unspecified: Secondary | ICD-10-CM | POA: Diagnosis not present

## 2017-10-27 DIAGNOSIS — F0391 Unspecified dementia with behavioral disturbance: Secondary | ICD-10-CM | POA: Diagnosis not present

## 2017-10-27 DIAGNOSIS — R892 Abnormal level of other drugs, medicaments and biological substances in specimens from other organs, systems and tissues: Secondary | ICD-10-CM | POA: Diagnosis not present

## 2017-10-27 LAB — TYPE AND SCREEN
ABO/RH(D): A POS
ANTIBODY SCREEN: NEGATIVE
Unit division: 0

## 2017-10-27 LAB — BASIC METABOLIC PANEL
ANION GAP: 7 (ref 5–15)
BUN: 28 mg/dL — AB (ref 6–20)
CALCIUM: 8.7 mg/dL — AB (ref 8.9–10.3)
CO2: 29 mmol/L (ref 22–32)
CREATININE: 1.05 mg/dL (ref 0.61–1.24)
Chloride: 105 mmol/L (ref 101–111)
GFR calc Af Amer: >60 mL/min (ref >60)
GFR calc non Af Amer: >60 mL/min (ref >60)
GLUCOSE: 148 mg/dL — AB (ref 65–99)
Potassium: 3.9 mmol/L (ref 3.5–5.1)
Sodium: 141 mmol/L (ref 135–145)

## 2017-10-27 LAB — BPAM RBC
BLOOD PRODUCT EXPIRATION DATE: 201901022359
ISSUE DATE / TIME: 201812141614
UNIT TYPE AND RH: 6200

## 2017-10-27 LAB — CBC
HEMATOCRIT: 28.9 % — AB (ref 39.0–52.0)
Hemoglobin: 9.7 g/dL — ABNORMAL LOW (ref 13.0–17.0)
MCH: 31.3 pg (ref 26.0–34.0)
MCHC: 33.6 g/dL (ref 30.0–36.0)
MCV: 93.2 fL (ref 78.0–100.0)
Platelets: 99 10*3/uL — ABNORMAL LOW (ref 150–400)
RBC: 3.1 MIL/uL — ABNORMAL LOW (ref 4.22–5.81)
RDW: 14.2 % (ref 11.5–15.5)
WBC: 9.6 10*3/uL (ref 4.0–10.5)

## 2017-10-27 LAB — GLUCOSE, CAPILLARY
Glucose-Capillary: 138 mg/dL — ABNORMAL HIGH (ref 65–99)
Glucose-Capillary: 187 mg/dL — ABNORMAL HIGH (ref 65–99)

## 2017-10-27 MED ORDER — PHENYTOIN 50 MG PO CHEW: 150.0000 mg | CHEWABLE_TABLET | Freq: Every day | ORAL | 0 refills | Status: DC

## 2017-10-27 MED ORDER — LORAZEPAM 0.5 MG PO TABS: 0.5000 mg | ORAL_TABLET | Freq: Every day | ORAL | 0 refills | Status: DC

## 2017-10-27 MED ORDER — ENSURE ENLIVE PO LIQD: 237.0000 mL | Freq: Two times a day (BID) | ORAL | Status: DC

## 2017-10-27 MED ORDER — DOCUSATE SODIUM 100 MG PO CAPS
100.0000 mg | ORAL_CAPSULE | Freq: Two times a day (BID) | ORAL | Status: DC
Start: 2017-10-27 — End: 2018-03-09

## 2017-10-27 NOTE — Progress Notes (Signed)
[facility] Anticipated discharge date: 12/15 Family notified: pt brother Transportation by [employer]- scheduled for 1:30pm  CSW signing off.  Burna SisJenna H. Roben Schliep, LCSW Clinical Social Worker [phone]

## 2017-10-27 NOTE — Clinical Social Work Placement (Signed)
   CLINICAL SOCIAL WORK PLACEMENT  NOTE  Date:  10/27/2017  Patient Details  Name: Arthur Berry MRN: [id number] Date of Birth: 06/01/1946  Clinical Social Work is seeking post-discharge placement for this patient at the Assisted Living Facility level of care (*CSW will initial, date and re-position this form in  chart as items are completed):  Yes   Patient/family provided with [place] Clinical Social Work Department's list of facilities offering this level of care within the geographic area requested by the patient (or if unable, by the patient's family).  Yes   Patient/family informed of their freedom to choose among providers that offer the needed level of care, that participate in Medicare, Medicaid or managed care program needed by the patient, have an available bed and are willing to accept the patient.  Yes   Patient/family informed of [facility] ownership interest in [facility] and [facility], as well as of the fact that they are under no obligation to receive care at these facilities.  PASRR submitted to EDS on       PASRR number received on 10/24/17     Existing PASRR number confirmed on       FL2 transmitted to all facilities in geographic area requested by pt/family on 10/25/17     FL2 transmitted to all facilities within larger geographic area on       Patient informed that his/her managed care company has contracts with or will negotiate with certain facilities, including the following:        Yes   Patient/family informed of bed offers received.  [facility]     Physician recommends and patient chooses bed at      [facility]  Patient to be transferred to facility by [facility]     Patient family notified on 10/27/17 of transfer.  Name of family member notified:  brother, Arthur Berry contacted     PHYSICIAN Please sign FL2     Additional Comment:     _______________________________________________ Burna SisUris, Hunter Pinkard H, LCSW 10/27/2017, 12:55 PM

## 2017-10-27 NOTE — Care Management Note (Signed)
Case Management Note  Patient Details  Name: Ivar BurySamuel W Harewood MRN: [id number] Date of Birth: [birth year]  Subjective/Objective:                 Patient with order to discharge to Skilled Nursing Facility (SNF). SNF discharge facilitated through Clinical Social Work (CSW). Please refer to CSW notes for disposition plan and direct questions CSW on call accordingly. CM signing off   Action/Plan:   Expected Discharge Date:  10/27/17               Expected Discharge Plan:  Skilled Nursing Facility  In-House Referral:  Clinical Social Work  Discharge planning Services  CM Consult  Post Acute Care Choice:    Choice offered to:     DME Arranged:    DME Agency:     HH Arranged:    HH Agency:     Status of Service:  Completed, signed off  If discussed at [employer] Meetings, dates discussed:    Additional Comments:  Lawerance SabalDebbie Leler Brion, RN 10/27/2017, 12:56 PM

## 2017-10-27 NOTE — Discharge Instructions (Signed)
INSTRUCTIONS AFTER JOINT REPLACEMENT  ° °o Remove items at home which could result in a fall. This includes throw rugs or furniture in walking pathways °o ICE to the affected joint every three hours while awake for 30 minutes at a time, for at least the first 3-5 days, and then as needed for pain and swelling.  Continue to use ice for pain and swelling. You may notice swelling that will progress down to the foot and ankle.  This is normal after surgery.  Elevate your leg when you are not up walking on it.   °o Continue to use the breathing machine you got in the hospital (incentive spirometer) which will help keep your temperature down.  It is common for your temperature to cycle up and down following surgery, especially at night when you are not up moving around and exerting yourself.  The breathing machine keeps your lungs expanded and your temperature down. ° ° °DIET:  As you were doing prior to hospitalization, we recommend a well-balanced diet. ° °DRESSING / WOUND CARE / SHOWERING ° °Keep the surgical dressing until follow up.  The dressing is water proof, so you can shower without any extra covering.  IF THE DRESSING FALLS OFF or the wound gets wet inside, change the dressing with sterile gauze.  Please use good hand washing techniques before changing the dressing.  Do not use any lotions or creams on the incision until instructed by your surgeon.   ° °ACTIVITY ° °o Increase activity slowly as tolerated, but follow the weight bearing instructions below.   °o No driving for 6 weeks or until further direction given by your physician.  You cannot drive while taking narcotics.  °o No lifting or carrying greater than 10 lbs. until further directed by your surgeon. °o Avoid periods of inactivity such as sitting longer than an hour when not asleep. This helps prevent blood clots.  °o You may return to work once you are authorized by your doctor.  ° ° ° °WEIGHT BEARING  ° °Weight bearing as tolerated with assist  device (walker, cane, etc) as directed, use it as long as suggested by your surgeon or therapist, typically at least 4-6 weeks. ° ° °EXERCISES ° °Results after joint replacement surgery are often greatly improved when you follow the exercise, range of motion and muscle strengthening exercises prescribed by your doctor. Safety measures are also important to protect the joint from further injury. Any time any of these exercises cause you to have increased pain or swelling, decrease what you are doing until you are comfortable again and then slowly increase them. If you have problems or questions, call your caregiver or physical therapist for advice.  ° °Rehabilitation is important following a joint replacement. After just a few days of immobilization, the muscles of the leg can become weakened and shrink (atrophy).  These exercises are designed to build up the tone and strength of the thigh and leg muscles and to improve motion. Often times heat used for twenty to thirty minutes before working out will loosen up your tissues and help with improving the range of motion but do not use heat for the first two weeks following surgery (sometimes heat can increase post-operative swelling).  ° °These exercises can be done on a training (exercise) mat, on the floor, on a table or on a bed. Use whatever works the best and is most comfortable for you.    Use music or television while you are exercising so that   the exercises are a pleasant break in your day. This will make your life better with the exercises acting as a break in your routine that you can look forward to.   Perform all exercises about fifteen times, three times per day or as directed.  You should exercise both the operative leg and the other leg as well. ° °Exercises include: °  °• Quad Sets - Tighten up the muscle on the front of the thigh (Quad) and hold for 5-10 seconds.   °• Straight Leg Raises - With your knee straight (if you were given a brace, keep it on),  lift the leg to 60 degrees, hold for 3 seconds, and slowly lower the leg.  Perform this exercise against resistance later as your leg gets stronger.  °• Leg Slides: Lying on your back, slowly slide your foot toward your buttocks, bending your knee up off the floor (only go as far as is comfortable). Then slowly slide your foot back down until your leg is flat on the floor again.  °• Angel Wings: Lying on your back spread your legs to the side as far apart as you can without causing discomfort.  °• Hamstring Strength:  Lying on your back, push your heel against the floor with your leg straight by tightening up the muscles of your buttocks.  Repeat, but this time bend your knee to a comfortable angle, and push your heel against the floor.  You may put a pillow under the heel to make it more comfortable if necessary.  ° °A rehabilitation program following joint replacement surgery can speed recovery and prevent re-injury in the future due to weakened muscles. Contact your doctor or a physical therapist for more information on knee rehabilitation.  ° ° °CONSTIPATION ° °Constipation is defined medically as fewer than three stools per week and severe constipation as less than one stool per week.  Even if you have a regular bowel pattern at home, your normal regimen is likely to be disrupted due to multiple reasons following surgery.  Combination of anesthesia, postoperative narcotics, change in appetite and fluid intake all can affect your bowels.  ° °YOU MUST use at least one of the following options; they are listed in order of increasing strength to get the job done.  They are all available over the counter, and you may need to use some, POSSIBLY even all of these options:   ° °Drink plenty of fluids (prune juice may be helpful) and high fiber foods °Colace 100 mg by mouth twice a day  °Senokot for constipation as directed and as needed Dulcolax (bisacodyl), take with full glass of water  °Miralax (polyethylene glycol)  once or twice a day as needed. ° °If you have tried all these things and are unable to have a bowel movement in the first 3-4 days after surgery call either your surgeon or your primary doctor.   ° °If you experience loose stools or diarrhea, hold the medications until you stool forms back up.  If your symptoms do not get better within 1 week or if they get worse, check with your doctor.  If you experience "the worst abdominal pain ever" or develop nausea or vomiting, please contact the office immediately for further recommendations for treatment. ° ° °ITCHING:  If you experience itching with your medications, try taking only a single pain pill, or even half a pain pill at a time.  You can also use Benadryl over the counter for itching or also to   help with sleep.   TED HOSE STOCKINGS:  Use stockings on both legs until for at least 2 weeks or as directed by physician office. They may be removed at night for sleeping.  MEDICATIONS:  See your medication summary on the After Visit Summary that nursing will review with you.  You may have some home medications which will be placed on hold until you complete the course of blood thinner medication.  It is important for you to complete the blood thinner medication as prescribed.  PRECAUTIONS:  If you experience chest pain or shortness of breath - call 911 immediately for transfer to the hospital emergency department.   If you develop a fever greater that 101 F, purulent drainage from wound, increased redness or drainage from wound, foul odor from the wound/dressing, or calf pain - CONTACT YOUR SURGEON.                                                   FOLLOW-UP APPOINTMENTS:  If you do not already have a post-op appointment, please call the office for an appointment to be seen by your surgeon.  Guidelines for how soon to be seen are listed in your After Visit Summary, but are typically between 1-4 weeks after surgery.  OTHER INSTRUCTIONS:   Knee  Replacement:  Do not place pillow under knee, focus on keeping the knee straight while resting. CPM instructions: 0-90 degrees, 2 hours in the morning, 2 hours in the afternoon, and 2 hours in the evening. Place foam block, curve side up under heel at all times except when in CPM or when walking.  DO NOT modify, tear, cut, or change the foam block in any way.  MAKE SURE YOU:   Understand these instructions.   Get help right away if you are not doing well or get worse.    Thank you for letting us be a part of your medical care team.  It is a privilege we respect greatly.  We hope these instructions will help you stay on track for a fast and full recovery!    Additional discharge instructions:  Please get your medications reviewed and adjusted by your Primary MD.  Please request your Primary MD to go over all Hospital Tests and Procedure/Radiological results at the follow up, please get all Hospital records sent to your Prim MD by signing hospital release before you go home.  If you had Pneumonia of Lung problems at the Hospital: Please get a 2 view Chest X ray done in 6-8 weeks after hospital discharge or sooner if instructed by your Primary MD.  If you have Congestive Heart Failure: Please call your Cardiologist or Primary MD anytime you have any of the following symptoms:  1) 3 pound weight gain in 24 hours or 5 pounds in 1 week  2) shortness of breath, with or without a dry hacking cough  3) swelling in the hands, feet or stomach  4) if you have to sleep on extra pillows at night in order to breathe  Follow cardiac low salt diet and 1.5 lit/day fluid restriction.  If you have diabetes Accuchecks 4 times/day, Once in AM empty stomach and then before each meal. Log in all results and show them to your primary doctor at your next visit. If any glucose reading is under 80 or above 300 call  your primary MD immediately.  If you have Seizure/Convulsions/Epilepsy: Please do not drive,  operate heavy machinery, participate in activities at heights or participate in high speed sports until you have seen by Primary MD or a Neurologist and advised to do so again.  If you had Gastrointestinal Bleeding: Please ask your Primary MD to check a complete blood count within one week of discharge or at your next visit. Your endoscopic/colonoscopic biopsies that are pending at the time of discharge, will also need to followed by your Primary MD.  Get Medicines reviewed and adjusted. Please take all your medications with you for your next visit with your Primary MD  Please request your Primary MD to go over all hospital tests and procedure/radiological results at the follow up, please ask your Primary MD to get all Hospital records sent to his/her office.  If you experience worsening of your admission symptoms, develop shortness of breath, life threatening emergency, suicidal or homicidal thoughts you must seek medical attention immediately by calling 911 or calling your MD immediately  if symptoms less severe.  You must read complete instructions/literature along with all the possible adverse reactions/side effects for all the Medicines you take and that have been prescribed to you. Take any new Medicines after you have completely understood and accpet all the possible adverse reactions/side effects.   Do not drive or operate heavy machinery when taking Pain medications.   Do not take more than prescribed Pain, Sleep and Anxiety Medications  Special Instructions: If you have smoked or chewed Tobacco  in the last 2 yrs please stop smoking, stop any regular Alcohol  and or any Recreational drug use.  Wear Seat belts while driving.  Please note You were cared for by a hospitalist during your hospital stay. If you have any questions about your discharge medications or the care you received while you were in the hospital after you are discharged, you can call the unit and asked to speak with  the hospitalist on call if the hospitalist that took care of you is not available. Once you are discharged, your primary care physician will handle any further medical issues. Please note that NO REFILLS for any discharge medications will be authorized once you are discharged, as it is imperative that you return to your primary care physician (or establish a relationship with a primary care physician if you do not have one) for your aftercare needs so that they can reassess your need for medications and monitor your lab values.  You can reach the hospitalist office at phone [phone] or fax [phone]   If you do not have a primary care physician, you can call [phone] for a physician referral.

## 2017-10-27 NOTE — Progress Notes (Signed)
Physical Therapy Treatment Patient Details Name: Arthur BurySamuel W Berry MRN: [id number] DOB: 11/26/1945 Today's Date: 10/27/2017    History of Present Illness Admitted with R hip fracture, now s/p R THA Anterior Approach, WBAT;  has a past medical history of Adjustment reaction with aggression (05/14/2017), Arthritis, Chronic kidney disease, Colon polyp (2005), Coronary artery disease, non-occlusive, Diabetes mellitus (2001), Diverticulosis, Epilepsy (HCC), GERD (gastroesophageal reflux disease) (04/23/2014), Goiter, nontoxic, multinodular, Hyperlipidemia (1999), Hypertension (1999), Internal hemorrhoids, Neuromuscular disorder (HCC), OSA (obstructive sleep apnea), Prostate enlargement, Sleep apnea, Splinter (04/27/13), Stroke (HCC) (06/2015), and Vision changes (06/2015).    PT Comments    Pt currently limited by lethargy. His eyes were closed through out session, only opening for a few seconds when cued. Pt responds appropriately to questions, however was disoriented to time, place, & situation. MD present after session reports that this is pt's cognitive baseline. Pt does not initiate any movement for ther ex or transfers, requiring total assist for bed mobilities and lift equipt for transfers. Pt would benefit from continued skilled PT to increase functional independence and return to PLOF.   Follow Up Recommendations  SNF;Supervision/Assistance - 24 hour     Equipment Recommendations  Wheelchair (measurements PT);Wheelchair cushion (measurements PT)(to be determined by facility)    Recommendations for Other Services       Precautions / Restrictions Precautions Precautions: Fall Restrictions Weight Bearing Restrictions: Yes RLE Weight Bearing: Weight bearing as tolerated    Mobility  Bed Mobility Overal bed mobility: Needs Assistance Bed Mobility: Supine to Sit     Supine to sit: +2 for physical assistance;Total assist     General bed mobility comments: Pt required total A +2 for trunk  and LE management.   Transfers Overall transfer level: Needs assistance               General transfer comment: Pt transfered via maximove to recliner chair. Maximove used for safety as pt is not initiating any movement and has L pusher tendencies.  Ambulation/Gait             General Gait Details: Unable   Stairs            Wheelchair Mobility    Modified Rankin (Stroke Patients Only)       Balance Overall balance assessment: Needs assistance Sitting-balance support: Feet supported;Bilateral upper extremity supported Sitting balance-Leahy Scale: Poor Sitting balance - Comments: Sitting balance appears to have improved from previous sessions. Once sitting EOB pt able to maintain upright position with min A to prevent R lateral lean. Postural control: Right lateral lean   Standing balance-Leahy Scale: Zero                              Cognition Arousal/Alertness: Lethargic(eyes remain closed throughout session, despite VC for opening. Family reports as near baseline. ) Behavior During Therapy: Flat affect Overall Cognitive Status: History of cognitive impairments - at baseline Area of Impairment: Following commands;Attention;Orientation                 Orientation Level: Disoriented to;Place;Time;Situation Current Attention Level: Alternating Memory: Decreased short-term memory(noted history of dementia) Following Commands: Follows one step commands inconsistently;Follows one step commands with increased time Safety/Judgement: Decreased awareness of safety;Decreased awareness of deficits   Problem Solving: Slow processing;Decreased initiation;Difficulty sequencing;Requires verbal cues;Requires tactile cues General Comments: MD present reports this is pt's baseline.       Exercises Total Joint Exercises Heel Slides: PROM;Right;10 reps(pt  not initiating any movement) Hip ABduction/ADduction: PROM;Right;10 reps(pt not initiating any  movement)    General Comments        Pertinent Vitals/Pain Pain Assessment: 0-10 Pain Score: 10-Worst pain ever Pain Location: Rt hip surgical site during BLE exercises at end range, otherwise pain appears manageble at rest.  Pain Descriptors / Indicators: Aching;Discomfort;Grimacing Pain Intervention(s): Monitored during session;Limited activity within patient's tolerance;Repositioned    Home Living                      Prior Function            PT Goals (current goals can now be found in the care plan section) Acute Rehab PT Goals Patient Stated Goal: return to supervised AMB  PT Goal Formulation: With family Time For Goal Achievement: 11/07/17 Potential to Achieve Goals: Fair Progress towards PT goals: Progressing toward goals(sitting balance showing improvement.)    Frequency    Min 3X/week      PT Plan Current plan remains appropriate    Co-evaluation              AM-PAC PT "6 Clicks" Daily Activity  Outcome Measure  Difficulty turning over in bed (including adjusting bedclothes, sheets and blankets)?: Unable Difficulty moving from lying on back to sitting on the side of the bed? : Unable Difficulty sitting down on and standing up from a chair with arms (e.g., wheelchair, bedside commode, etc,.)?: Unable Help needed moving to and from a bed to chair (including a wheelchair)?: Total Help needed walking in hospital room?: Total Help needed climbing 3-5 steps with a railing? : Total 6 Click Score: 6    End of Session   Activity Tolerance: Patient limited by lethargy;Patient tolerated treatment well(This may be pt's baseline) Patient left: with call bell/phone within reach;in chair;with chair alarm set(maximove sling positioned under pt) Nurse Communication: Mobility status;Need for lift equipment PT Visit Diagnosis: Unsteadiness on feet (R26.81);Other abnormalities of gait and mobility (R26.89);Pain;Other symptoms and signs involving the nervous  system (R29.898);Hemiplegia and hemiparesis Hemiplegia - Right/Left: Right Hemiplegia - dominant/non-dominant: Non-dominant Hemiplegia - caused by: Cerebral infarction Pain - Right/Left: Right Pain - part of body: Hip     Time: [id number] PT Time Calculation (min) (ACUTE ONLY): 23 min  Charges:  $Therapeutic Activity: 23-37 mins                    G Codes:      Arthur LocksHannah Canaan Berry, [place]A Pager [phone] Acute Rehab  Arthur ApleyHannah E Martise Berry 10/27/2017, 10:14 AM

## 2017-10-27 NOTE — Progress Notes (Signed)
MEDICATION RELATED CONSULT NOTE - FOLLOW UP   Pharmacy Consult for Dilantin Indication: seizure DO    No Known Allergies  Patient Measurements: Height: 5\' 9"  (175.3 cm) Weight: 175 lb (79.4 kg) IBW/kg (Calculated) : 70.7 Adjusted Body Weight:   Vital Signs: Temp: 98.7 F (37.1 C) (12/15 0437) Temp Source: Oral (12/15 0437) BP: 131/64 (12/15 0437) Pulse Rate: 67 (12/15 0437) Intake/Output from previous day: 12/14 0701 - 12/15 0700 In: 1150 [P.O.:730; Blood:420] Out: 400 [Urine:400] Intake/Output from this shift: No intake/output data recorded.  Labs: Recent Labs    10/25/17 0543 10/26/17 0639 10/27/17 0457  WBC 12.7* 9.9 9.6  HGB 8.8* 7.6* 9.7*  HCT 25.9* 22.3* 28.9*  PLT 97* 97* 99*  CREATININE 1.03  --  1.05   Estimated Creatinine Clearance: 64.5 mL/min (by C-G formula based on SCr of 1.05 mg/dL).   Microbiology: Recent Results (from the past 720 hour(s))  MRSA PCR Screening     Status: None   Collection Time: 10/23/17  8:55 AM  Result Value Ref Range Status   MRSA by PCR NEGATIVE NEGATIVE Final    Comment:        The GeneXpert MRSA Assay (FDA approved for NASAL specimens only), is one component of a comprehensive MRSA colonization surveillance program. It is not intended to diagnose MRSA infection nor to guide or monitor treatment for MRSA infections.     Assessment:  Neuro: Seizure DO, depression, dementia with behavioral abnormalities: 12/11: Phenytoin pta on hold x 24 hrs per Neuro recs with level 23.3 (no alb).  12/12: level 15.1 corrected for alb 3.2 = 20.4 - Lexapro, Galantamine, Ativan hs melatonin, Remeron, Risperdal  Goal of Therapy:  Phenytoin corrected level 10-20  Plan:  Phenytoin 150 mg po daily to start 12/13 Level in 5-7 days Possible DC to SNF 12/15  Tammey Deeg S. Merilynn Finlandobertson, PharmD, BCPS Clinical Staff Pharmacist Pager [phone]  Misty Stanleyobertson, Tiasia Weberg Stillinger 10/27/2017,9:25 AM

## 2017-10-27 NOTE — Progress Notes (Signed)
   Subjective: 4 Days Post-Op Procedure(s) (LRB): TOTAL HIP ARTHROPLASTY ANTERIOR APPROACH (Right) Patient reports pain as mild and moderate.  Getting [place] lift to recliner. He has eyes closed but opens and converses to command.   Objective: Vital signs in last 24 hours: Temp:  [98.1 F (36.7 C)-99.9 F (37.7 C)] 98.7 F (37.1 C) (12/15 0437) Pulse Rate:  [67-97] 67 (12/15 0437) Resp:  [14-16] 16 (12/15 0437) BP: (90-131)/(41-64) 131/64 (12/15 0437) SpO2:  [96 %-99 %] 97 % (12/15 0437)  Intake/Output from previous day: 12/14 0701 - 12/15 0700 In: 1150 [P.O.:730; Blood:420] Out: 400 [Urine:400] Intake/Output this shift: No intake/output data recorded.  Recent Labs    10/25/17 0543 10/26/17 0639 10/27/17 0457  HGB 8.8* 7.6* 9.7*   Recent Labs    10/26/17 0639 10/27/17 0457  WBC 9.9 9.6  RBC 2.40* 3.10*  HCT 22.3* 28.9*  PLT 97* 99*   Recent Labs    10/25/17 0543 10/27/17 0457  NA 140 141  K 3.6 3.9  CL 104 105  CO2 26 29  BUN 23* 28*  CREATININE 1.03 1.05  GLUCOSE 175* 148*  CALCIUM 8.5* 8.7*   No results for input(s): LABPT, INR in the last 72 hours.  PE:   Dressing intact LL equal No results found.  Assessment/Plan: 4 Days Post-Op Procedure(s) (LRB): TOTAL HIP ARTHROPLASTY ANTERIOR APPROACH (Right) Discharge to SNF when medically stable.   Arthur Berry 10/27/2017, 9:54 AM

## 2017-10-27 NOTE — Discharge Summary (Signed)
Physician Discharge Summary  Arthur Berry:[id number]:[id number] DOB: January 07, 1946  PCP: Rodrigo RanPerini, Mark, MD  Admit date: 10/23/2017 Discharge date: 10/27/2017  Recommendations for Outpatient Follow-up:  1. M.D. at SNF in 3 days with repeat labs (CBC, BMP, Dilantin level and albumin). 2. Dr. Doneen Poissonhristopher Blackman, Orthopedics in 2 weeks. 3. Dr. Rodrigo RanMark Perini, PCP upon discharge from SNF.  Home Health: None Equipment/Devices: None    Discharge Condition: Improved and stable  CODE STATUS: Full  Diet recommendation: Heart healthy & diabetic diet.  Discharge Diagnoses:  Principal Problem:   Closed right hip fracture, initial encounter (HCC) Active Problems:   Essential hypertension, benign   Epilepsy (HCC)   Type 2 diabetes mellitus with other circulatory complications (HCC)   Thrombocytopenia (HCC)   Closed subcapital fracture of right femur [facility](HCC)   Brief Summary: 71 year old male with PMH of HTN, epilepsy, DM, dementia presented to ED following a mechanical fall without report of hitting his head or losing consciousness. X-rays revealed right hip fracture. Orthopedics performed right total hip arthroplasty on 12/11. Postop course complicated by altered mental status, now back to baseline. Also postop ABLA and transfused 1 unit PRBC on 12/14.  Assessment & Plan:   1. Right hip fracture status post total hip arthroplasty anterior approach 12/11: Orthopedics follow-up appreciated. Aspirin 325 MG daily for postop DVT prophylaxis, no dressing change until follow up in orthopedic office in 2 weeks, weightbearing as tolerated. 2. Seizure disorder: Was on Dilantin 200 MG daily and Keppra PTA. Presented with Dilantin level 23. His case was discussed by admitting physician with Neuro hospitalist who recommended holding Dilantin for 24 hours and rechecking levels. Keppra was continued. Repeat Dilantin level 15.2 on 12/12. Resumed Dilantin at lower dose of 150 MG daily on 12/13. Check Dilantin level in 3  days and adjust Dilantin dose as needed. As per discussion with patient's brother, he has had seizures since age 89 and has been on Dilantin since then and has been on Keppra also for a long time. Recommend outpatient neurology consultation and follow-up. No seizures reported in the hospital. 3. Essential hypertension: Continue home dose of amlodipine 2.5 MG daily and carvedilol 3.125 MG twice daily. Lisinopril temporarily stopped due to reasonably controlled blood pressures on the above medications and to avoid hypotension. Follow-up at SNF and may resume lisinopril when his blood pressure's start to increase. 4. Type II DM: Mildly uncontrolled and fluctuating. SSI while hospitalized. Resume prior home dose of Lantus, SSI and metformin at discharge. 5. Acute blood loss anemia: Hemoglobin dropped from 12.5 on 12/11 to 7.6 on 12/14. Postoperative blood loss. No overt signs of bleeding. Transfuse 1 unit of PRBC on 12/14 and hemoglobin today is 9.7. [facility] 6. Thrombocytopenia: Appears chronic. Stable. Follow CBCs periodically. 7. Goiter: Seen on x-ray. Outpatient workup as deemed necessary. 8. Depression: Resumed home dose of Lexapro 10 MG daily. Stable. 9. Dementia with behavioral abnormalities: continue home dose of galantamine,bedtime Risperdal, Ativan and Remeron. As per patient's family, current mental status is at his baseline. 10. Hyperlipidemia: Continue statins. 11. Hypokalemia: Replaced.   Consultants:  Orthopedics   Procedures:  Right hip total arthroplasty anterior approach 12/11    Discharge Instructions  Discharge Instructions    Call MD for:  difficulty breathing, headache or visual disturbances   Complete by:  As directed    Call MD for:  extreme fatigue   Complete by:  As directed    Call MD for:  persistant dizziness or  light-headedness   Complete by:  As directed    Call MD for:  persistant nausea and vomiting   Complete by:  As directed     Call MD for:  redness, tenderness, or signs of infection (pain, swelling, redness, odor or green/yellow discharge around incision site)   Complete by:  As directed    Call MD for:  severe uncontrolled pain   Complete by:  As directed    Call MD for:  temperature >100.4   Complete by:  As directed    Diet - low sodium heart healthy   Complete by:  As directed    Diet Carb Modified   Complete by:  As directed    Full weight bearing   Complete by:  As directed    Increase activity slowly   Complete by:  As directed        Medication List    STOP taking these medications   lisinopril 20 MG tablet Commonly known as:  PRINIVIL,ZESTRIL   phenytoin 100 MG ER capsule Commonly known as:  DILANTIN     TAKE these medications   amLODipine 2.5 MG tablet Commonly known as:  NORVASC Take 2.5 mg by mouth daily.   aspirin 325 MG tablet Take 1 tablet (325 mg total) by mouth daily.   atorvastatin 40 MG tablet Commonly known as:  LIPITOR Take 40 mg by mouth daily at 6 PM.   carvedilol 3.125 MG tablet Commonly known as:  COREG Take 3.125 mg by mouth 2 (two) times daily with a meal.   docusate sodium 100 MG capsule Commonly known as:  COLACE Take 1 capsule (100 mg total) by mouth 2 (two) times daily.   escitalopram 10 MG tablet Commonly known as:  LEXAPRO Take 10 mg by mouth daily.   feeding supplement (ENSURE ENLIVE) Liqd Take 237 mLs by mouth 2 (two) times daily between meals.   galantamine 16 MG 24 hr capsule Commonly known as:  RAZADYNE ER Take 16 mg by mouth daily with breakfast.   HYDROcodone-acetaminophen 5-325 MG tablet Commonly known as:  NORCO/VICODIN Take 1-2 tablets by mouth every 4 (four) hours as needed for moderate pain.   LANTUS SOLOSTAR 100 UNIT/ML Solostar Pen Generic drug:  Insulin Glargine Inject 5 Units into the skin daily. In am   levETIRAcetam 500 MG tablet Commonly known as:  KEPPRA Take 500 mg by mouth 2 (two) times daily.   LORazepam 0.5 MG  tablet Commonly known as:  ATIVAN Take 1 tablet (0.5 mg total) by mouth at bedtime.   magnesium hydroxide 400 MG/5ML suspension Commonly known as:  MILK OF MAGNESIA Take 30 mLs by mouth at bedtime as needed for mild constipation.   magnesium oxide 400 MG tablet Commonly known as:  MAG-OX Take 400 mg by mouth daily.   Melatonin 3 MG Tabs Take 3 mg by mouth at bedtime.   metFORMIN 500 MG tablet Commonly known as:  GLUCOPHAGE Take 500 mg by mouth 2 (two) times daily.   mirtazapine 30 MG tablet Commonly known as:  REMERON Take 30 mg by mouth at bedtime.   NOVOLOG FLEXPEN 100 UNIT/ML FlexPen Generic drug:  insulin aspart Inject 0-14 Units into the skin See admin instructions. Inject 0-14 units twice daily per sliding scale. If sugar less than 120 units = 0 units, 121-150= 2 units, 151-180= 3 units, 181-210= 4 units, 211-250= 5 units, 251-280= 6 units, 281-330= 8 units, 331-400= 10 units, If BS is greater than 400 inject 14 units  pantoprazole 40 MG tablet Commonly known as:  PROTONIX Take 1 tablet (40 mg total) by mouth daily.   phenytoin 50 MG tablet Commonly known as:  DILANTIN Chew 3 tablets (150 mg total) by mouth daily. Start taking on:  10/28/2017   potassium chloride SA 20 MEQ tablet Commonly known as:  K-DUR,KLOR-CON Take 20 mEq by mouth daily.   risperiDONE 2 MG tablet Commonly known as:  RISPERDAL Take 2 mg by mouth at bedtime.   Vitamin D3 5000 units Tabs Take 5,000 Units by mouth daily.       Contact information for follow-up providers    Kathryne HitchBlackman, Christopher Y, MD. Schedule an appointment as soon as possible for a visit in 2 week(s).   Specialty:  Orthopedic Surgery Contact information: [address] [place] [address] [phone]        M.D. at SNF. Schedule an appointment as soon as possible for a visit in 3 day(s).   Why:  To be seen with repeat labs (CBC, BMP, Dilantin level and albumin).       Rodrigo RanPerini, Mark, MD. Schedule an  appointment as soon as possible for a visit.   Specialty:  Internal Medicine Why:  Upon discharge from SNF. Contact information: [address] [place] [address] [phone]            Contact information for after-discharge care    Destination    [facility] Follow up.   Service:  Skilled Nursing Contact information: [address] [place] [id number] [phone]                 No Known Allergies    Procedures/Studies: Dg Chest 1 View  Result Date: 10/23/2017 CLINICAL DATA:  Right hip fracture tonight EXAM: CHEST 1 VIEW COMPARISON:  07/26/2017 FINDINGS: A single supine view of the chest is negative for pneumothorax or large effusion. Mediastinal contours are normal with the exception of a thoracic inlet mass corresponding to the enlarged thyroid seen on prior CT examinations dating back to at least 06/17/2015. No airspace consolidation. Normal heart size. No displaced fractures in the chest. IMPRESSION: No acute cardiopulmonary findings.  Known thyroid mass or goiter. Electronically Signed   By: Ellery Plunkaniel R Mitchell M.D.   On: 10/23/2017 03:36   Pelvis Portable  Result Date: 10/23/2017 CLINICAL DATA:  Status post right total hip replacement. EXAM: PORTABLE PELVIS 1-2 VIEWS COMPARISON:  Intraoperative fluoroscopic images and right hip radiographs earlier today FINDINGS: Sequelae of right total hip arthroplasty are identified. The prosthetic femoral and acetabular components are approximated with one another on this single AP image. Postoperative gas is noted in the adjacent soft tissues with skin staples in place. No periprosthetic fracture is identified. Calcifications in the pelvis likely represent phleboliths. IMPRESSION: Right total hip arthroplasty without evidence of immediate postoperative complication. Electronically Signed   By: Sebastian AcheAllen  Grady M.D.   On: 10/23/2017 12:24   Dg C-arm 1-60 Min  Result Date: 10/23/2017 CLINICAL DATA:   Right hip replacement. EXAM: OPERATIVE RIGHT HIP (WITH PELVIS IF PERFORMED) 3 VIEWS TECHNIQUE: Fluoroscopic spot image(s) were submitted for interpretation post-operatively. COMPARISON:  No recent. FINDINGS: Total right hip replacement. Hardware intact. Anatomic alignment. No acute abnormality. IMPRESSION: Total right hip replacement with anatomic alignment. Electronically Signed   By: Maisie Fushomas  Register   On: 10/23/2017 12:00   Dg Hip Operative Unilat W Or W/o Pelvis Right  Result Date: 10/23/2017 CLINICAL DATA:  Right hip replacement. EXAM: OPERATIVE RIGHT HIP (WITH PELVIS IF PERFORMED)  3 VIEWS TECHNIQUE: Fluoroscopic spot image(s) were submitted for interpretation post-operatively. COMPARISON:  No recent. FINDINGS: Total right hip replacement. Hardware intact. Anatomic alignment. No acute abnormality. IMPRESSION: Total right hip replacement with anatomic alignment. Electronically Signed   By: Maisie Fus  Register   On: 10/23/2017 12:00   Dg Hip Unilat With Pelvis 2-3 Views Right  Result Date: 10/23/2017 CLINICAL DATA:  Larey Seat while attempting to get up to the bathroom this morning. EXAM: DG HIP (WITH OR WITHOUT PELVIS) 2-3V RIGHT COMPARISON:  None. FINDINGS: There is a subcapital right hip fracture with mild foreshortening. No dislocation. No radiographic findings to suggest a pathologic basis for the fracture. Bony pelvis is intact. Pubic symphysis and sacroiliac joints are intact. IMPRESSION: Subcapital right hip fracture. Electronically Signed   By: Ellery Plunk M.D.   On: 10/23/2017 03:31      Subjective: Patient interviewed and examined along with patient's RN and the physical therapist in the room. Patient sitting on her reclining chair comfortably. Has his eyes closed but awake and answers questions appropriately. Oriented to self and place. Indicates that he is sore in the right hip but not as bad as yesterday. He wants to go home. As per RN, no acute issues. As per PT, not fully cooperative  with therapy.  Discharge Exam:  Vitals:   10/26/17 1914 10/26/17 2004 10/26/17 2133 10/27/17 0437  BP: (!) 105/55 (!) 105/48 (!) 115/53 131/64  Pulse: 97 72 67 67  Resp: 16 14  16   Temp: 99.1 F (37.3 C) 98.8 F (37.1 C) 98.1 F (36.7 C) 98.7 F (37.1 C)  TempSrc: Axillary Oral Oral Oral  SpO2: 96% 98% 99% 97%  Weight:      Height:        General exam: Elderly male, moderately built and nourished sitting up comfortably in reclining chair. Respiratory system: Clear to auscultation. Respiratory effort normal.  Cardiovascular system: S1 & S2 heard, RRR. No JVD, murmurs, rubs, gallops or clicks. No pedal edema.  Gastrointestinal system: Abdomen is nondistended, soft and nontender. No organomegaly or masses felt. Normal bowel sounds heard.  Central nervous system:  Mental status as indicated above. Follows simple instructions. No focal neurological deficits. Extremities: Moves bilateral upper extremities and left lower extremity well. Right hip postop dressing clean and dry. Right hip movements limited secondary to pain. No active bleeding at surgical site.  Skin: No rashes, lesions or ulcers Psychiatry: Judgement and insight chronically impaired. Mood & affect flat.       The results of significant diagnostics from this hospitalization (including imaging, microbiology, ancillary and laboratory) are listed below for reference.     Microbiology: Recent Results (from the past 240 hour(s))  MRSA PCR Screening     Status: None   Collection Time: 10/23/17  8:55 AM  Result Value Ref Range Status   MRSA by PCR NEGATIVE NEGATIVE Final    Comment:        The GeneXpert MRSA Assay (FDA approved for NASAL specimens only), is one component of a comprehensive MRSA colonization surveillance program. It is not intended to diagnose MRSA infection nor to guide or monitor treatment for MRSA infections.      Labs: CBC: Recent Labs  Lab 10/23/17 0254 10/24/17 0452 10/25/17 0543  10/26/17 0639 10/27/17 0457  WBC 11.3* 11.5* 12.7* 9.9 9.6  NEUTROABS 7.9*  --   --   --   --   HGB 12.5* 9.6* 8.8* 7.6* 9.7*  HCT 36.3* 28.4* 25.9* 22.3* 28.9*  MCV  92.6 91.9 92.5 92.9 93.2  PLT 122* 100* 97* 97* 99*   Basic Metabolic Panel: Recent Labs  Lab 10/23/17 0254 10/24/17 0452 10/25/17 0543 10/27/17 0457  NA 141 139 140 141  K 4.1 3.3* 3.6 3.9  CL 102 103 104 105  CO2 30 26 26 29   GLUCOSE 141* 180* 175* 148*  BUN 30* 19 23* 28*  CREATININE 1.12 1.00 1.03 1.05  CALCIUM 9.2 8.3* 8.5* 8.7*   Liver Function Tests: Recent Labs  Lab 10/24/17 0452  AST 24  ALT 15*  ALKPHOS 112  BILITOT 0.7  PROT 5.5*  ALBUMIN 3.2*   CBG: Recent Labs  Lab 10/26/17 0659 10/26/17 1215 10/26/17 1636 10/26/17 2135 10/27/17 0553  GLUCAP 158* 173* 161* 109* 138*       Time coordinating discharge: Over 30 minutes  SIGNED:  Marcellus Scott, MD, FACP, [facility]. [facility] Pager [phone]  If 7PM-7AM, please contact night-coverage [email or web address] Password [email or web address] 10/27/2017, 11:48 AM

## 2017-10-30 DIAGNOSIS — S72011D Unspecified intracapsular fracture of right femur, subsequent encounter for closed fracture with routine healing: Secondary | ICD-10-CM | POA: Diagnosis not present

## 2017-10-30 DIAGNOSIS — D696 Thrombocytopenia, unspecified: Secondary | ICD-10-CM | POA: Diagnosis not present

## 2017-10-30 DIAGNOSIS — M6281 Muscle weakness (generalized): Secondary | ICD-10-CM | POA: Diagnosis not present

## 2017-10-30 DIAGNOSIS — I10 Essential (primary) hypertension: Secondary | ICD-10-CM | POA: Diagnosis not present

## 2017-10-30 DIAGNOSIS — E876 Hypokalemia: Secondary | ICD-10-CM | POA: Diagnosis not present

## 2017-10-30 DIAGNOSIS — F445 Conversion disorder with seizures or convulsions: Secondary | ICD-10-CM | POA: Diagnosis not present

## 2017-10-30 DIAGNOSIS — K219 Gastro-esophageal reflux disease without esophagitis: Secondary | ICD-10-CM | POA: Diagnosis not present

## 2017-10-30 DIAGNOSIS — E048 Other specified nontoxic goiter: Secondary | ICD-10-CM | POA: Diagnosis not present

## 2017-10-30 DIAGNOSIS — E119 Type 2 diabetes mellitus without complications: Secondary | ICD-10-CM | POA: Diagnosis not present

## 2017-10-30 DIAGNOSIS — D649 Anemia, unspecified: Secondary | ICD-10-CM | POA: Diagnosis not present

## 2017-10-30 DIAGNOSIS — E785 Hyperlipidemia, unspecified: Secondary | ICD-10-CM | POA: Diagnosis not present

## 2017-10-30 DIAGNOSIS — R52 Pain, unspecified: Secondary | ICD-10-CM | POA: Diagnosis not present

## 2017-11-01 DIAGNOSIS — R339 Retention of urine, unspecified: Secondary | ICD-10-CM | POA: Diagnosis not present

## 2017-11-01 DIAGNOSIS — E048 Other specified nontoxic goiter: Secondary | ICD-10-CM | POA: Diagnosis not present

## 2017-11-01 DIAGNOSIS — E876 Hypokalemia: Secondary | ICD-10-CM | POA: Diagnosis not present

## 2017-11-01 DIAGNOSIS — M6281 Muscle weakness (generalized): Secondary | ICD-10-CM | POA: Diagnosis not present

## 2017-11-01 DIAGNOSIS — R52 Pain, unspecified: Secondary | ICD-10-CM | POA: Diagnosis not present

## 2017-11-01 DIAGNOSIS — D696 Thrombocytopenia, unspecified: Secondary | ICD-10-CM | POA: Diagnosis not present

## 2017-11-01 DIAGNOSIS — E119 Type 2 diabetes mellitus without complications: Secondary | ICD-10-CM | POA: Diagnosis not present

## 2017-11-01 DIAGNOSIS — D649 Anemia, unspecified: Secondary | ICD-10-CM | POA: Diagnosis not present

## 2017-11-01 DIAGNOSIS — I10 Essential (primary) hypertension: Secondary | ICD-10-CM | POA: Diagnosis not present

## 2017-11-01 DIAGNOSIS — S72011D Unspecified intracapsular fracture of right femur, subsequent encounter for closed fracture with routine healing: Secondary | ICD-10-CM | POA: Diagnosis not present

## 2017-11-01 DIAGNOSIS — E785 Hyperlipidemia, unspecified: Secondary | ICD-10-CM | POA: Diagnosis not present

## 2017-11-01 DIAGNOSIS — F445 Conversion disorder with seizures or convulsions: Secondary | ICD-10-CM | POA: Diagnosis not present

## 2017-11-05 DIAGNOSIS — D696 Thrombocytopenia, unspecified: Secondary | ICD-10-CM | POA: Diagnosis not present

## 2017-11-05 DIAGNOSIS — M6281 Muscle weakness (generalized): Secondary | ICD-10-CM | POA: Diagnosis not present

## 2017-11-05 DIAGNOSIS — I10 Essential (primary) hypertension: Secondary | ICD-10-CM | POA: Diagnosis not present

## 2017-11-05 DIAGNOSIS — E119 Type 2 diabetes mellitus without complications: Secondary | ICD-10-CM | POA: Diagnosis not present

## 2017-11-05 DIAGNOSIS — K5900 Constipation, unspecified: Secondary | ICD-10-CM | POA: Diagnosis not present

## 2017-11-05 DIAGNOSIS — F445 Conversion disorder with seizures or convulsions: Secondary | ICD-10-CM | POA: Diagnosis not present

## 2017-11-05 DIAGNOSIS — S72011D Unspecified intracapsular fracture of right femur, subsequent encounter for closed fracture with routine healing: Secondary | ICD-10-CM | POA: Diagnosis not present

## 2017-11-05 DIAGNOSIS — D649 Anemia, unspecified: Secondary | ICD-10-CM | POA: Diagnosis not present

## 2017-11-05 DIAGNOSIS — E876 Hypokalemia: Secondary | ICD-10-CM | POA: Diagnosis not present

## 2017-11-05 DIAGNOSIS — E048 Other specified nontoxic goiter: Secondary | ICD-10-CM | POA: Diagnosis not present

## 2017-11-05 DIAGNOSIS — R52 Pain, unspecified: Secondary | ICD-10-CM | POA: Diagnosis not present

## 2017-11-05 DIAGNOSIS — R339 Retention of urine, unspecified: Secondary | ICD-10-CM | POA: Diagnosis not present

## 2017-11-07 DIAGNOSIS — E785 Hyperlipidemia, unspecified: Secondary | ICD-10-CM | POA: Diagnosis not present

## 2017-11-07 DIAGNOSIS — F445 Conversion disorder with seizures or convulsions: Secondary | ICD-10-CM | POA: Diagnosis not present

## 2017-11-07 DIAGNOSIS — R52 Pain, unspecified: Secondary | ICD-10-CM | POA: Diagnosis not present

## 2017-11-07 DIAGNOSIS — E048 Other specified nontoxic goiter: Secondary | ICD-10-CM | POA: Diagnosis not present

## 2017-11-07 DIAGNOSIS — K219 Gastro-esophageal reflux disease without esophagitis: Secondary | ICD-10-CM | POA: Diagnosis not present

## 2017-11-07 DIAGNOSIS — E876 Hypokalemia: Secondary | ICD-10-CM | POA: Diagnosis not present

## 2017-11-07 DIAGNOSIS — M6281 Muscle weakness (generalized): Secondary | ICD-10-CM | POA: Diagnosis not present

## 2017-11-07 DIAGNOSIS — I10 Essential (primary) hypertension: Secondary | ICD-10-CM | POA: Diagnosis not present

## 2017-11-07 DIAGNOSIS — E119 Type 2 diabetes mellitus without complications: Secondary | ICD-10-CM | POA: Diagnosis not present

## 2017-11-07 DIAGNOSIS — D696 Thrombocytopenia, unspecified: Secondary | ICD-10-CM | POA: Diagnosis not present

## 2017-11-07 DIAGNOSIS — D649 Anemia, unspecified: Secondary | ICD-10-CM | POA: Diagnosis not present

## 2017-11-07 DIAGNOSIS — S72011D Unspecified intracapsular fracture of right femur, subsequent encounter for closed fracture with routine healing: Secondary | ICD-10-CM | POA: Diagnosis not present

## 2017-11-12 ENCOUNTER — Encounter (INDEPENDENT_AMBULATORY_CARE_PROVIDER_SITE_OTHER): Payer: Self-pay | Admitting: Physician Assistant

## 2017-11-12 ENCOUNTER — Ambulatory Visit (INDEPENDENT_AMBULATORY_CARE_PROVIDER_SITE_OTHER): Payer: Medicare Other | Admitting: Physician Assistant

## 2017-11-12 DIAGNOSIS — Z96641 Presence of right artificial hip joint: Secondary | ICD-10-CM | POA: Insufficient documentation

## 2017-11-12 NOTE — Progress Notes (Signed)
Arthur Berry 71 year old male who returns now 3 weeks status post right total hip arthroplasty due to a right hip fracture.  [facility].His  bother and sister in-law accompany him today.  Prior to the fall he ambulated with a walker and per his brother he had a shuffling gait.  There is a full-time resident at a skilled facility secondary to stroke.  Physical exam: General well-developed well-nourished male seated in wheelchair in no acute distress. Right hip surgical incisions healing well well approximated with staples no signs of infection.  Right calf supple nontender.  Good range of motion of the right hip without significant pain.  Able to dorsiflex plantarflex the ankle  Impression: Status post right total hip arthroplasty 10/23/2017  Plan: He will continue to work with physical therapy on gait balance strengthening the right hip.  Staples removed today Steri-Strips applied.  He will follow-up with us in 1 month. Continue 325 mg aspirin for one more week then discontinue Aspirin and resume any Aspirin he was on pre-op.                                                                                 QA 1`

## 2017-11-14 DIAGNOSIS — D649 Anemia, unspecified: Secondary | ICD-10-CM | POA: Diagnosis not present

## 2017-11-14 DIAGNOSIS — E048 Other specified nontoxic goiter: Secondary | ICD-10-CM | POA: Diagnosis not present

## 2017-11-14 DIAGNOSIS — G40911 Epilepsy, unspecified, intractable, with status epilepticus: Secondary | ICD-10-CM | POA: Diagnosis not present

## 2017-11-14 DIAGNOSIS — K219 Gastro-esophageal reflux disease without esophagitis: Secondary | ICD-10-CM | POA: Diagnosis not present

## 2017-11-14 DIAGNOSIS — E119 Type 2 diabetes mellitus without complications: Secondary | ICD-10-CM | POA: Diagnosis not present

## 2017-11-14 DIAGNOSIS — E876 Hypokalemia: Secondary | ICD-10-CM | POA: Diagnosis not present

## 2017-11-14 DIAGNOSIS — D696 Thrombocytopenia, unspecified: Secondary | ICD-10-CM | POA: Diagnosis not present

## 2017-11-14 DIAGNOSIS — R52 Pain, unspecified: Secondary | ICD-10-CM | POA: Diagnosis not present

## 2017-11-14 DIAGNOSIS — I10 Essential (primary) hypertension: Secondary | ICD-10-CM | POA: Diagnosis not present

## 2017-11-14 DIAGNOSIS — M6281 Muscle weakness (generalized): Secondary | ICD-10-CM | POA: Diagnosis not present

## 2017-11-14 DIAGNOSIS — E785 Hyperlipidemia, unspecified: Secondary | ICD-10-CM | POA: Diagnosis not present

## 2017-11-14 DIAGNOSIS — S72011D Unspecified intracapsular fracture of right femur, subsequent encounter for closed fracture with routine healing: Secondary | ICD-10-CM | POA: Diagnosis not present

## 2017-11-16 DIAGNOSIS — S72011D Unspecified intracapsular fracture of right femur, subsequent encounter for closed fracture with routine healing: Secondary | ICD-10-CM | POA: Diagnosis not present

## 2017-11-16 DIAGNOSIS — D649 Anemia, unspecified: Secondary | ICD-10-CM | POA: Diagnosis not present

## 2017-11-16 DIAGNOSIS — E785 Hyperlipidemia, unspecified: Secondary | ICD-10-CM | POA: Diagnosis not present

## 2017-11-16 DIAGNOSIS — R52 Pain, unspecified: Secondary | ICD-10-CM | POA: Diagnosis not present

## 2017-11-16 DIAGNOSIS — E119 Type 2 diabetes mellitus without complications: Secondary | ICD-10-CM | POA: Diagnosis not present

## 2017-11-16 DIAGNOSIS — I10 Essential (primary) hypertension: Secondary | ICD-10-CM | POA: Diagnosis not present

## 2017-11-16 DIAGNOSIS — E048 Other specified nontoxic goiter: Secondary | ICD-10-CM | POA: Diagnosis not present

## 2017-11-16 DIAGNOSIS — F445 Conversion disorder with seizures or convulsions: Secondary | ICD-10-CM | POA: Diagnosis not present

## 2017-11-16 DIAGNOSIS — M6281 Muscle weakness (generalized): Secondary | ICD-10-CM | POA: Diagnosis not present

## 2017-11-16 DIAGNOSIS — E876 Hypokalemia: Secondary | ICD-10-CM | POA: Diagnosis not present

## 2017-11-16 DIAGNOSIS — D696 Thrombocytopenia, unspecified: Secondary | ICD-10-CM | POA: Diagnosis not present

## 2017-11-16 DIAGNOSIS — K219 Gastro-esophageal reflux disease without esophagitis: Secondary | ICD-10-CM | POA: Diagnosis not present

## 2017-11-21 ENCOUNTER — Emergency Department (HOSPITAL_COMMUNITY): Payer: Medicare Other

## 2017-11-21 ENCOUNTER — Emergency Department (HOSPITAL_COMMUNITY)
Admission: EM | Admit: 2017-11-21 | Discharge: 2017-11-21 | Disposition: A | Discharge status: Home / Self Care | Payer: Medicare Other | Attending: Emergency Medicine | Admitting: Emergency Medicine

## 2017-11-21 ENCOUNTER — Other Ambulatory Visit: Payer: Self-pay

## 2017-11-21 ENCOUNTER — Encounter (HOSPITAL_COMMUNITY): Payer: Self-pay | Admitting: Emergency Medicine

## 2017-11-21 DIAGNOSIS — D62 Acute posthemorrhagic anemia: Secondary | ICD-10-CM | POA: Diagnosis not present

## 2017-11-21 DIAGNOSIS — W06XXXA Fall from bed, initial encounter: Secondary | ICD-10-CM | POA: Diagnosis not present

## 2017-11-21 DIAGNOSIS — S161XXA Strain of muscle, fascia and tendon at neck level, initial encounter: Secondary | ICD-10-CM | POA: Insufficient documentation

## 2017-11-21 DIAGNOSIS — N189 Chronic kidney disease, unspecified: Secondary | ICD-10-CM | POA: Diagnosis not present

## 2017-11-21 DIAGNOSIS — M5030 Other cervical disc degeneration, unspecified cervical region: Secondary | ICD-10-CM | POA: Diagnosis not present

## 2017-11-21 DIAGNOSIS — G4489 Other headache syndrome: Secondary | ICD-10-CM | POA: Diagnosis not present

## 2017-11-21 DIAGNOSIS — E1122 Type 2 diabetes mellitus with diabetic chronic kidney disease: Secondary | ICD-10-CM | POA: Insufficient documentation

## 2017-11-21 DIAGNOSIS — F0281 Dementia in other diseases classified elsewhere with behavioral disturbance: Secondary | ICD-10-CM | POA: Diagnosis not present

## 2017-11-21 DIAGNOSIS — Y92122 Bedroom in nursing home as the place of occurrence of the external cause: Secondary | ICD-10-CM | POA: Insufficient documentation

## 2017-11-21 DIAGNOSIS — E119 Type 2 diabetes mellitus without complications: Secondary | ICD-10-CM | POA: Diagnosis not present

## 2017-11-21 DIAGNOSIS — G40901 Epilepsy, unspecified, not intractable, with status epilepticus: Secondary | ICD-10-CM | POA: Diagnosis not present

## 2017-11-21 DIAGNOSIS — S01111A Laceration without foreign body of right eyelid and periocular area, initial encounter: Secondary | ICD-10-CM | POA: Diagnosis not present

## 2017-11-21 DIAGNOSIS — S300XXA Contusion of lower back and pelvis, initial encounter: Secondary | ICD-10-CM | POA: Diagnosis not present

## 2017-11-21 DIAGNOSIS — S199XXA Unspecified injury of neck, initial encounter: Secondary | ICD-10-CM | POA: Diagnosis not present

## 2017-11-21 DIAGNOSIS — Z794 Long term (current) use of insulin: Secondary | ICD-10-CM | POA: Diagnosis not present

## 2017-11-21 DIAGNOSIS — N400 Enlarged prostate without lower urinary tract symptoms: Secondary | ICD-10-CM | POA: Diagnosis not present

## 2017-11-21 DIAGNOSIS — I10 Essential (primary) hypertension: Secondary | ICD-10-CM | POA: Diagnosis not present

## 2017-11-21 DIAGNOSIS — Y9389 Activity, other specified: Secondary | ICD-10-CM | POA: Insufficient documentation

## 2017-11-21 DIAGNOSIS — Z7982 Long term (current) use of aspirin: Secondary | ICD-10-CM | POA: Insufficient documentation

## 2017-11-21 DIAGNOSIS — F0390 Unspecified dementia without behavioral disturbance: Secondary | ICD-10-CM | POA: Diagnosis not present

## 2017-11-21 DIAGNOSIS — W19XXXA Unspecified fall, initial encounter: Secondary | ICD-10-CM

## 2017-11-21 DIAGNOSIS — G8911 Acute pain due to trauma: Secondary | ICD-10-CM | POA: Diagnosis not present

## 2017-11-21 DIAGNOSIS — S0990XA Unspecified injury of head, initial encounter: Secondary | ICD-10-CM | POA: Diagnosis not present

## 2017-11-21 DIAGNOSIS — S0591XD Unspecified injury of right eye and orbit, subsequent encounter: Secondary | ICD-10-CM | POA: Diagnosis not present

## 2017-11-21 DIAGNOSIS — S72001D Fracture of unspecified part of neck of right femur, subsequent encounter for closed fracture with routine healing: Secondary | ICD-10-CM | POA: Diagnosis not present

## 2017-11-21 DIAGNOSIS — Y999 Unspecified external cause status: Secondary | ICD-10-CM | POA: Diagnosis not present

## 2017-11-21 DIAGNOSIS — S79911A Unspecified injury of right hip, initial encounter: Secondary | ICD-10-CM | POA: Diagnosis not present

## 2017-11-21 DIAGNOSIS — F3289 Other specified depressive episodes: Secondary | ICD-10-CM | POA: Diagnosis not present

## 2017-11-21 DIAGNOSIS — G40909 Epilepsy, unspecified, not intractable, without status epilepticus: Secondary | ICD-10-CM | POA: Diagnosis not present

## 2017-11-21 DIAGNOSIS — S81802D Unspecified open wound, left lower leg, subsequent encounter: Secondary | ICD-10-CM | POA: Diagnosis not present

## 2017-11-21 DIAGNOSIS — M150 Primary generalized (osteo)arthritis: Secondary | ICD-10-CM | POA: Diagnosis not present

## 2017-11-21 DIAGNOSIS — R51 Headache: Secondary | ICD-10-CM | POA: Diagnosis not present

## 2017-11-21 DIAGNOSIS — S098XXA Other specified injuries of head, initial encounter: Secondary | ICD-10-CM | POA: Diagnosis not present

## 2017-11-21 MED ORDER — TRAMADOL HCL 50 MG PO TABS
50.0000 mg | ORAL_TABLET | Freq: Once | ORAL | Status: AC
Start: 2017-11-21 — End: 2017-11-21
  Administered 2017-11-21: 50 mg via ORAL
  Filled 2017-11-21: qty 1

## 2017-11-21 MED ORDER — TRAMADOL HCL 50 MG PO TABS: 50.0000 mg | ORAL_TABLET | Freq: Four times a day (QID) | ORAL | 0 refills | Status: DC | PRN

## 2017-11-21 NOTE — ED Notes (Signed)
Pt's brother here to visit pt  States he has health care power of attorney  He also states that pt had a hip replacement on the right side recently

## 2017-11-21 NOTE — ED Notes (Signed)
Dr Judd Lienelo in to assess pt  Pt is c/o right hip pain on exam

## 2017-11-21 NOTE — ED Notes (Signed)
[facility] called to transport pt back to [facility] 

## 2017-11-21 NOTE — ED Provider Notes (Signed)
[facility]-EMERGENCY DEPT Provider Note   CSN: [id number] Arrival date & time: 11/21/17  0500     History   Chief Complaint Chief Complaint  Patient presents with  . Fall  . Head Injury    HPI Arthur Berry is a 72 y.o. male.  Patient is a 72 year old male with history of dementia brought from the memory care facility for evaluation of fall.  He apparently fell getting out of bed and was found on the floor.  He has a small laceration above the right eyebrow and is complaining of pain in his right hip.  He does report mild neck discomfort.  He is uncertain as to whether or not he lost consciousness but denies headache.   The history is provided by the patient.  Fall  This is a new problem. The current episode started less than 1 hour ago. The problem occurs constantly. The problem has not changed since onset.Nothing aggravates the symptoms. Nothing relieves the symptoms. He has tried nothing for the symptoms.    Past Medical History:  Diagnosis Date  . Adjustment reaction with aggression 05/14/2017  . Arthritis   . Chronic kidney disease   . Colon polyp 2005   Tubular Adenoma  . Coronary artery disease, non-occlusive    Minimal nonobstructive CAD, nl LV systolic fxn by cath 12/11/12  . Diabetes mellitus 2001   TYPE 2  . Diverticulosis   . Epilepsy (HCC)   . GERD (gastroesophageal reflux disease) 04/23/2014  . Goiter, nontoxic, multinodular    Thyroid US 11/2012  . Hyperlipidemia 1999  . Hypertension 1999  . Internal hemorrhoids   . Neuromuscular disorder (HCC)   . OSA (obstructive sleep apnea)   . Prostate enlargement   . Sleep apnea   . Splinter 04/27/13   metal plinter removed rt pointer finger  . Stroke (HCC) 06/2015  . Vision changes 06/2015   since CVA    Patient Active Problem List   Diagnosis Date Noted  . Status post total replacement of right hip 11/12/2017  . Closed right hip fracture, initial encounter (HCC) 10/23/2017  . Closed  subcapital fracture of right femur (HCC)   . Adjustment reaction with aggression 05/14/2017  . Type 2 diabetes mellitus with other circulatory complications (HCC) 06/19/2015  . Thrombocytopenia (HCC) 06/19/2015  . Cerebral infarction due to unspecified mechanism   . Dilantin toxicity 06/18/2015  . Phenytoin toxicity   . Stroke (HCC) 06/16/2015  . Fall 06/16/2015  . Epilepsy (HCC) 06/16/2015  . BPH (benign prostatic hyperplasia) 06/16/2015  . Leukocytosis 06/16/2015  . CVA (cerebral infarction) 06/16/2015  . CVA (cerebral vascular accident) (HCC) 06/15/2015  . GERD (gastroesophageal reflux disease) 04/23/2014  . Potassium (K) deficiency 12/11/2012  . Precordial pain 12/11/2012  . Dehydration 04/15/2012  . AKI (acute kidney injury) (HCC) 04/14/2012  . HLD (hyperlipidemia) 06/01/2010  . Essential hypertension, benign 06/01/2010  . DYSPNEA 06/01/2010  . Diabetes mellitus without complication (HCC) 11/04/2009  . RECTAL BLEEDING 11/04/2009  . PERSONAL HX COLONIC POLYPS 11/04/2009    Past Surgical History:  Procedure Laterality Date  . CHOLECYSTECTOMY    . COLONOSCOPY  12/28/2009  . ESOPHAGOGASTRODUODENOSCOPY  10/14/2012   normal   . INGUINAL HERNIA REPAIR  06/27/11   left  . KNEE SURGERY  2008   Left  . LEFT HEART CATHETERIZATION WITH CORONARY ANGIOGRAM N/A 12/11/2012   Procedure: LEFT HEART CATHETERIZATION WITH CORONARY ANGIOGRAM;  Surgeon: Tonny Bollman, MD;  Location: [facility] CATH LAB;  Service: Cardiovascular;  Laterality: N/A;  . TOTAL HIP ARTHROPLASTY Right 10/23/2017   Procedure: TOTAL HIP ARTHROPLASTY ANTERIOR APPROACH;  Surgeon: Kathryne Hitch, MD;  Location: [facility] OR;  Service: Orthopedics;  Laterality: Right;       Home Medications    Prior to Admission medications   Medication Sig Start Date End Date Taking? Authorizing Provider  amLODipine (NORVASC) 2.5 MG tablet Take 2.5 mg by mouth daily.    [provider]  aspirin 325 MG tablet Take 1 tablet (325  mg total) by mouth daily. 06/22/15   Catarina Hartshorn, MD  atorvastatin (LIPITOR) 40 MG tablet Take 40 mg by mouth daily at 6 PM.     [provider]  carvedilol (COREG) 3.125 MG tablet Take 3.125 mg by mouth 2 (two) times daily with a meal.    [provider]  Cholecalciferol (VITAMIN D3) 5000 units TABS Take 5,000 Units by mouth daily.     [provider]  docusate sodium (COLACE) 100 MG capsule Take 1 capsule (100 mg total) by mouth 2 (two) times daily. 10/27/17   Hongalgi, Maximino Greenland, MD  escitalopram (LEXAPRO) 10 MG tablet Take 10 mg by mouth daily.    [provider]  feeding supplement, ENSURE ENLIVE, (ENSURE ENLIVE) LIQD Take 237 mLs by mouth 2 (two) times daily between meals. 10/27/17   Hongalgi, Maximino Greenland, MD  galantamine (RAZADYNE ER) 16 MG 24 hr capsule Take 16 mg by mouth daily with breakfast.    [provider]  HYDROcodone-acetaminophen (NORCO/VICODIN) 5-325 MG tablet Take 1-2 tablets by mouth every 4 (four) hours as needed for moderate pain. 10/24/17   Kathryne Hitch, MD  insulin aspart (NOVOLOG FLEXPEN) 100 UNIT/ML FlexPen Inject 0-14 Units into the skin See admin instructions. Inject 0-14 units twice daily per sliding scale. If sugar less than 120 units = 0 units, 121-150= 2 units, 151-180= 3 units, 181-210= 4 units, 211-250= 5 units, 251-280= 6 units, 281-330= 8 units, 331-400= 10 units, If BS is greater than 400 inject 14 units    [provider]  LANTUS SOLOSTAR 100 UNIT/ML Solostar Pen Inject 5 Units into the skin daily. In am 07/06/16   [provider]  levETIRAcetam (KEPPRA) 500 MG tablet Take 500 mg by mouth 2 (two) times daily.      [provider]  LORazepam (ATIVAN) 0.5 MG tablet Take 1 tablet (0.5 mg total) by mouth at bedtime. 10/27/17   Hongalgi, Maximino Greenland, MD  magnesium hydroxide (MILK OF MAGNESIA) 400 MG/5ML suspension Take 30 mLs by mouth at bedtime as needed for mild constipation.     [provider]  magnesium oxide (MAG-OX) 400 MG tablet Take 400 mg by mouth daily.    [provider]  Melatonin 3 MG TABS Take 3 mg by mouth at bedtime.    [provider]  metFORMIN (GLUCOPHAGE) 500 MG tablet Take 500 mg by mouth 2 (two) times daily.     [provider]  mirtazapine (REMERON) 30 MG tablet Take 30 mg by mouth at bedtime.     [provider]  pantoprazole (PROTONIX) 40 MG tablet Take 1 tablet (40 mg total) by mouth daily. 06/22/15   Catarina Hartshorn, MD  phenytoin (DILANTIN) 50 MG tablet Chew 3 tablets (150 mg total) by mouth daily. 10/28/17   Hongalgi, Maximino Greenland, MD  potassium chloride SA (K-DUR,KLOR-CON) 20 MEQ tablet Take 20 mEq by mouth daily.     [provider]  risperiDONE (RISPERDAL) 2 MG tablet  Take 2 mg by mouth at bedtime.     [provider]    Family History Family History  Problem Relation Age of Onset  . Heart disease Mother   . Alzheimer's disease Father   . Breast cancer Sister   . Colon cancer Unknown        unknown  . Diabetes Sister     Social History Social History   Tobacco Use  . Smoking status: Former Smoker    Types: Cigars    Last attempt to quit: 04/14/1986    Years since quitting: 31.6  . Smokeless tobacco: Never Used  . Tobacco comment: Quit cigars in the 1980s  Substance Use Topics  . Alcohol use: No  . Drug use: No     Allergies   Patient has no known allergies.   Review of Systems Review of Systems  All other systems reviewed and are negative.    Physical Exam Updated Vital Signs BP (!) 169/100 (BP Location: Left Arm)   Pulse 86   Temp 98.8 F (37.1 C) (Oral)   Resp 16   SpO2 98%   Physical Exam  Constitutional: He is oriented to person, place, and time. He appears well-developed and well-nourished. No distress.  HENT:  Head: Normocephalic and atraumatic.  Mouth/Throat: Oropharynx is clear and moist.  There is a less than 1 cm laceration to the right lateral eyebrow  area.  It is well approximated and bleeding is controlled.  Eyes: EOM are normal.  Neck: Normal range of motion. Neck supple.  Cardiovascular: Normal rate and regular rhythm. Exam reveals no friction rub.  No murmur heard. Pulmonary/Chest: Effort normal and breath sounds normal. No respiratory distress. He has no wheezes. He has no rales.  Abdominal: Soft. Bowel sounds are normal. He exhibits no distension. There is no tenderness.  Musculoskeletal: Normal range of motion. He exhibits no edema.  There is mild tenderness over the right lateral hip, however no deformity or shortening of the leg.  Distal PMS is intact.  He does have some pain in the hip with range of motion.  Neurological: He is alert and oriented to person, place, and time. No cranial nerve deficit. He exhibits normal muscle tone. Coordination normal.  Skin: Skin is warm and dry. He is not diaphoretic.  Nursing note and vitals reviewed.    ED Treatments / Results  Labs (all labs ordered are listed, but only abnormal results are displayed) Labs Reviewed - No data to display  EKG  EKG Interpretation None       Radiology No results found.  Procedures Procedures (including critical care time)  Medications Ordered in ED Medications - No data to display   Initial Impression / Assessment and Plan / ED Course  I have reviewed the triage vital signs and the nursing notes.  Pertinent labs & imaging results that were available during my care of the patient were reviewed by me and considered in my medical decision making (see chart for details).  CT scan of the head is unremarkable and cervical spine is clear.  X-rays reveal no dislodgment of the hip prosthesis or evidence for pelvic fracture.  At this point, I see no indication for any further workup.  The laceration above his right eye brow does not require any repair this will be treated with local wound care and a Band-Aid.  To follow-up as needed for any  problems.  Final Clinical Impressions(s) / ED Diagnoses   Final diagnoses:  None  ED Discharge Orders    None       Geoffery Lyonselo, Teagyn Fishel, MD 11/21/17 [phone]

## 2017-11-21 NOTE — ED Triage Notes (Signed)
[facility]  Pt had an unwitnessed fall  Staff found pt on the left side of his bed in between the bedside table and the bed  Denies LOC  Alert to person  Pt is c/o headache  Small laceration noted above pt's right eyebrow  Bleeding controlled

## 2017-11-21 NOTE — ED Notes (Signed)
Report called to Liz at [facility] ?

## 2017-11-21 NOTE — ED Notes (Signed)
Patient transported to CT 

## 2017-11-21 NOTE — Discharge Instructions (Signed)
Tramadol as prescribed as needed for pain.  Follow-up with primary doctor if not improving in the next few days, and return to the ER if symptoms significantly worsen or change.

## 2017-11-22 DIAGNOSIS — G40909 Epilepsy, unspecified, not intractable, without status epilepticus: Secondary | ICD-10-CM | POA: Diagnosis not present

## 2017-11-22 DIAGNOSIS — S72001D Fracture of unspecified part of neck of right femur, subsequent encounter for closed fracture with routine healing: Secondary | ICD-10-CM | POA: Diagnosis not present

## 2017-11-26 DIAGNOSIS — S72001D Fracture of unspecified part of neck of right femur, subsequent encounter for closed fracture with routine healing: Secondary | ICD-10-CM | POA: Diagnosis not present

## 2017-11-26 DIAGNOSIS — G40909 Epilepsy, unspecified, not intractable, without status epilepticus: Secondary | ICD-10-CM | POA: Diagnosis not present

## 2017-11-27 DIAGNOSIS — S72001D Fracture of unspecified part of neck of right femur, subsequent encounter for closed fracture with routine healing: Secondary | ICD-10-CM | POA: Diagnosis not present

## 2017-11-27 DIAGNOSIS — G40909 Epilepsy, unspecified, not intractable, without status epilepticus: Secondary | ICD-10-CM | POA: Diagnosis not present

## 2017-11-28 ENCOUNTER — Encounter (HOSPITAL_COMMUNITY): Payer: Self-pay | Admitting: Emergency Medicine

## 2017-11-28 ENCOUNTER — Emergency Department (HOSPITAL_COMMUNITY)
Admission: EM | Admit: 2017-11-28 | Discharge: 2017-11-28 | Disposition: A | Discharge status: Home / Self Care | Payer: Medicare Other | Attending: Emergency Medicine | Admitting: Emergency Medicine

## 2017-11-28 ENCOUNTER — Emergency Department (HOSPITAL_COMMUNITY): Payer: Medicare Other

## 2017-11-28 ENCOUNTER — Other Ambulatory Visit: Payer: Self-pay

## 2017-11-28 DIAGNOSIS — R4182 Altered mental status, unspecified: Secondary | ICD-10-CM | POA: Diagnosis not present

## 2017-11-28 DIAGNOSIS — Y939 Activity, unspecified: Secondary | ICD-10-CM | POA: Diagnosis not present

## 2017-11-28 DIAGNOSIS — Y999 Unspecified external cause status: Secondary | ICD-10-CM | POA: Diagnosis not present

## 2017-11-28 DIAGNOSIS — S0990XA Unspecified injury of head, initial encounter: Secondary | ICD-10-CM | POA: Insufficient documentation

## 2017-11-28 DIAGNOSIS — Z23 Encounter for immunization: Secondary | ICD-10-CM | POA: Insufficient documentation

## 2017-11-28 DIAGNOSIS — Z79899 Other long term (current) drug therapy: Secondary | ICD-10-CM | POA: Insufficient documentation

## 2017-11-28 DIAGNOSIS — I129 Hypertensive chronic kidney disease with stage 1 through stage 4 chronic kidney disease, or unspecified chronic kidney disease: Secondary | ICD-10-CM | POA: Insufficient documentation

## 2017-11-28 DIAGNOSIS — W06XXXA Fall from bed, initial encounter: Secondary | ICD-10-CM | POA: Insufficient documentation

## 2017-11-28 DIAGNOSIS — S098XXA Other specified injuries of head, initial encounter: Secondary | ICD-10-CM | POA: Diagnosis not present

## 2017-11-28 DIAGNOSIS — I2510 Atherosclerotic heart disease of native coronary artery without angina pectoris: Secondary | ICD-10-CM | POA: Insufficient documentation

## 2017-11-28 DIAGNOSIS — Z87891 Personal history of nicotine dependence: Secondary | ICD-10-CM | POA: Diagnosis not present

## 2017-11-28 DIAGNOSIS — S01111A Laceration without foreign body of right eyelid and periocular area, initial encounter: Secondary | ICD-10-CM | POA: Insufficient documentation

## 2017-11-28 DIAGNOSIS — N189 Chronic kidney disease, unspecified: Secondary | ICD-10-CM | POA: Diagnosis not present

## 2017-11-28 DIAGNOSIS — S4992XA Unspecified injury of left shoulder and upper arm, initial encounter: Secondary | ICD-10-CM | POA: Diagnosis not present

## 2017-11-28 DIAGNOSIS — E1122 Type 2 diabetes mellitus with diabetic chronic kidney disease: Secondary | ICD-10-CM | POA: Insufficient documentation

## 2017-11-28 DIAGNOSIS — S0181XA Laceration without foreign body of other part of head, initial encounter: Secondary | ICD-10-CM | POA: Diagnosis not present

## 2017-11-28 DIAGNOSIS — W19XXXA Unspecified fall, initial encounter: Secondary | ICD-10-CM

## 2017-11-28 DIAGNOSIS — S72001D Fracture of unspecified part of neck of right femur, subsequent encounter for closed fracture with routine healing: Secondary | ICD-10-CM | POA: Diagnosis not present

## 2017-11-28 DIAGNOSIS — Y92122 Bedroom in nursing home as the place of occurrence of the external cause: Secondary | ICD-10-CM | POA: Insufficient documentation

## 2017-11-28 DIAGNOSIS — G8911 Acute pain due to trauma: Secondary | ICD-10-CM | POA: Diagnosis not present

## 2017-11-28 DIAGNOSIS — Z794 Long term (current) use of insulin: Secondary | ICD-10-CM | POA: Insufficient documentation

## 2017-11-28 DIAGNOSIS — G40909 Epilepsy, unspecified, not intractable, without status epilepticus: Secondary | ICD-10-CM | POA: Diagnosis not present

## 2017-11-28 DIAGNOSIS — S199XXA Unspecified injury of neck, initial encounter: Secondary | ICD-10-CM | POA: Diagnosis not present

## 2017-11-28 MED ORDER — TETANUS-DIPHTH-ACELL PERTUSSIS 5-2.5-18.5 LF-MCG/0.5 IM SUSP
0.5000 mL | Freq: Once | INTRAMUSCULAR | Status: AC
  Administered 2017-11-28: 0.5 mL via INTRAMUSCULAR
  Filled 2017-11-28: qty 0.5

## 2017-11-28 MED ORDER — LIDOCAINE-EPINEPHRINE (PF) 2 %-1:200000 IJ SOLN
10.0000 mL | Freq: Once | INTRAMUSCULAR | Status: AC
  Administered 2017-11-28: 10 mL
  Filled 2017-11-28: qty 20

## 2017-11-28 NOTE — ED Notes (Signed)
[facility] with [facility]

## 2017-11-28 NOTE — ED Notes (Signed)
Bed: [id number] Expected date:  Expected time:  Means of arrival:  Comments: EMS fall/head injury

## 2017-11-28 NOTE — ED Provider Notes (Signed)
[facility]-EMERGENCY DEPT Provider Note   CSN: [id number] Arrival date & time: 11/28/17  0347     History   Chief Complaint Chief Complaint  Patient presents with  . Fall  . Head Injury  . Laceration above right eyebrow    HPI Arthur Berry is a 72 y.o. male.  Patient presents to the ED with a chief complaint of fall.  He states that he fell getting out of bed.  He states that this is a common occurrence for him and that he frequently gets tripped up.  He struck his head during the fall.  He denies passing out.  Denies vision changes, speech changes, numbness, weakness, or tingling.  He denies any other complaints.   The history is provided by the patient. No language interpreter was used.    Past Medical History:  Diagnosis Date  . Adjustment reaction with aggression 05/14/2017  . Arthritis   . Chronic kidney disease   . Colon polyp 2005   Tubular Adenoma  . Coronary artery disease, non-occlusive    Minimal nonobstructive CAD, nl LV systolic fxn by cath 12/11/12  . Diabetes mellitus 2001   TYPE 2  . Diverticulosis   . Epilepsy (HCC)   . GERD (gastroesophageal reflux disease) 04/23/2014  . Goiter, nontoxic, multinodular    Thyroid US 11/2012  . Hyperlipidemia 1999  . Hypertension 1999  . Internal hemorrhoids   . Neuromuscular disorder (HCC)   . OSA (obstructive sleep apnea)   . Prostate enlargement   . Sleep apnea   . Splinter 04/27/13   metal plinter removed rt pointer finger  . Stroke (HCC) 06/2015  . Vision changes 06/2015   since CVA    Patient Active Problem List   Diagnosis Date Noted  . Status post total replacement of right hip 11/12/2017  . Closed right hip fracture, initial encounter (HCC) 10/23/2017  . Closed subcapital fracture of right femur (HCC)   . Adjustment reaction with aggression 05/14/2017  . Type 2 diabetes mellitus with other circulatory complications (HCC) 06/19/2015  . Thrombocytopenia (HCC) 06/19/2015  . Cerebral  infarction due to unspecified mechanism   . Dilantin toxicity 06/18/2015  . Phenytoin toxicity   . Stroke (HCC) 06/16/2015  . Fall 06/16/2015  . Epilepsy (HCC) 06/16/2015  . BPH (benign prostatic hyperplasia) 06/16/2015  . Leukocytosis 06/16/2015  . CVA (cerebral infarction) 06/16/2015  . CVA (cerebral vascular accident) (HCC) 06/15/2015  . GERD (gastroesophageal reflux disease) 04/23/2014  . Potassium (K) deficiency 12/11/2012  . Precordial pain 12/11/2012  . Dehydration 04/15/2012  . AKI (acute kidney injury) (HCC) 04/14/2012  . HLD (hyperlipidemia) 06/01/2010  . Essential hypertension, benign 06/01/2010  . DYSPNEA 06/01/2010  . Diabetes mellitus without complication (HCC) 11/04/2009  . RECTAL BLEEDING 11/04/2009  . PERSONAL HX COLONIC POLYPS 11/04/2009    Past Surgical History:  Procedure Laterality Date  . CHOLECYSTECTOMY    . COLONOSCOPY  12/28/2009  . ESOPHAGOGASTRODUODENOSCOPY  10/14/2012   normal   . INGUINAL HERNIA REPAIR  06/27/11   left  . KNEE SURGERY  2008   Left  . LEFT HEART CATHETERIZATION WITH CORONARY ANGIOGRAM N/A 12/11/2012   Procedure: LEFT HEART CATHETERIZATION WITH CORONARY ANGIOGRAM;  Surgeon: Tonny Bollman, MD;  Location[facility] CATH LAB;  Service: Cardiovascular;  Laterality: N/A;  . TOTAL HIP ARTHROPLASTY Right 10/23/2017   Procedure: TOTAL HIP ARTHROPLASTY ANTERIOR APPROACH;  Surgeon: Kathryne Hitch, MD;  Location: [facility] OR;  Service: Orthopedics;  Laterality: Right;  Home Medications    Prior to Admission medications   Medication Sig Start Date End Date Taking? Authorizing Provider  amLODipine (NORVASC) 2.5 MG tablet Take 2.5 mg by mouth daily.   Yes [provider]  atorvastatin (LIPITOR) 40 MG tablet Take 40 mg by mouth daily at 6 PM.    Yes [provider]  carvedilol (COREG) 3.125 MG tablet Take 3.125 mg by mouth 2 (two) times daily with a meal.   Yes [provider]  cholecalciferol (VITAMIN D) 1000  units tablet Take 5,000 Units by mouth daily.   Yes [provider]  escitalopram (LEXAPRO) 10 MG tablet Take 10 mg by mouth daily.   Yes [provider]  galantamine (RAZADYNE ER) 16 MG 24 hr capsule Take 16 mg by mouth daily with breakfast.   Yes [provider]  insulin aspart (NOVOLOG FLEXPEN) 100 UNIT/ML FlexPen Inject 0-14 Units into the skin See admin instructions. Inject 0-14 units twice daily per sliding scale. If sugar less than 120 units = 0 units, 121-150= 2 units, 151-180= 3 units, 181-210= 4 units, 211-250= 5 units, 251-280= 6 units, 281-330= 8 units, 331-400= 10 units, If BS is greater than 400 inject 14 units   Yes [provider]  LANTUS SOLOSTAR 100 UNIT/ML Solostar Pen Inject 5 Units into the skin daily. In am 07/06/16  Yes [provider]  levETIRAcetam (KEPPRA) 500 MG tablet Take 500 mg by mouth 2 (two) times daily.     Yes [provider]  LORazepam (ATIVAN) 0.5 MG tablet Take 1 tablet (0.5 mg total) by mouth at bedtime. 10/27/17  Yes Hongalgi, Maximino GreenlandAnand D, MD  magnesium hydroxide (MILK OF MAGNESIA) 400 MG/5ML suspension Take 30 mLs by mouth at bedtime as needed for mild constipation.    Yes [provider]  magnesium oxide (MAG-OX) 400 MG tablet Take 400 mg by mouth daily.   Yes [provider]  Melatonin 3 MG TABS Take 3 mg by mouth at bedtime.   Yes [provider]  metFORMIN (GLUCOPHAGE) 500 MG tablet Take 500 mg by mouth 2 (two) times daily.    Yes [provider]  mirtazapine (REMERON) 30 MG tablet Take 30 mg by mouth at bedtime.    Yes [provider]  pantoprazole (PROTONIX) 40 MG tablet Take 1 tablet (40 mg total) by mouth daily. 06/22/15  Yes Tat, Onalee Huaavid, MD  phenytoin (DILANTIN) 100 MG ER capsule Take 200 mg by mouth daily. 10/22/17  Yes [provider]  potassium chloride SA (K-DUR,KLOR-CON) 20 MEQ tablet Take 20 mEq by mouth daily.    Yes [provider]    risperiDONE (RISPERDAL) 2 MG tablet Take 2 mg by mouth at bedtime.    Yes [provider]  traMADol (ULTRAM) 50 MG tablet Take 1 tablet (50 mg total) by mouth every 6 (six) hours as needed. 11/21/17  Yes Delo, Riley Lamouglas, MD  aspirin 325 MG tablet Take 1 tablet (325 mg total) by mouth daily. Patient not taking: Reported on 11/28/2017 06/22/15   Catarina Hartshornat, David, MD  docusate sodium (COLACE) 100 MG capsule Take 1 capsule (100 mg total) by mouth 2 (two) times daily. Patient not taking: Reported on 11/21/2017 10/27/17   Elease EtienneHongalgi, Anand D, MD  feeding supplement, ENSURE ENLIVE, (ENSURE ENLIVE) LIQD Take 237 mLs by mouth 2 (two) times daily between meals. Patient not taking: Reported on 11/21/2017 10/27/17   Elease EtienneHongalgi, Anand D, MD  HYDROcodone-acetaminophen (NORCO/VICODIN) 5-325 MG tablet Take 1-2 tablets by mouth  every 4 (four) hours as needed for moderate pain. Patient not taking: Reported on 11/21/2017 10/24/17   Kathryne Hitch, MD  lisinopril (PRINIVIL,ZESTRIL) 20 MG tablet Take 20 mg by mouth daily.    [provider]  phenytoin (DILANTIN) 50 MG tablet Chew 3 tablets (150 mg total) by mouth daily. Patient not taking: Reported on 11/21/2017 10/28/17   Elease Etienne, MD    Family History Family History  Problem Relation Age of Onset  . Heart disease Mother   . Alzheimer's disease Father   . Breast cancer Sister   . Colon cancer Unknown        unknown  . Diabetes Sister     Social History Social History   Tobacco Use  . Smoking status: Former Smoker    Types: Cigars    Last attempt to quit: 04/14/1986    Years since quitting: 31.6  . Smokeless tobacco: Never Used  . Tobacco comment: Quit cigars in the 1980s  Substance Use Topics  . Alcohol use: No  . Drug use: No     Allergies   Patient has no known allergies.   Review of Systems Review of Systems  All other systems reviewed and are negative.    Physical Exam Updated Vital Signs BP 134/79   Pulse 69   SpO2  99%   Physical Exam  Constitutional: He is oriented to person, place, and time. He appears well-developed and well-nourished.  HENT:  Head: Normocephalic and atraumatic.  3 cm linear laceration over right eyebrow, no damage to underlying structures  Eyes: Conjunctivae and EOM are normal. Pupils are equal, round, and reactive to light. Right eye exhibits no discharge. Left eye exhibits no discharge. No scleral icterus.  Neck: Normal range of motion. Neck supple. No JVD present.  Cardiovascular: Normal rate, regular rhythm and normal heart sounds. Exam reveals no gallop and no friction rub.  No murmur heard. Pulmonary/Chest: Effort normal and breath sounds normal. No respiratory distress. He has no wheezes. He has no rales. He exhibits no tenderness.  Abdominal: Soft. He exhibits no distension and no mass. There is no tenderness. There is no rebound and no guarding.  Musculoskeletal: Normal range of motion. He exhibits no edema or tenderness.  Neurological: He is alert and oriented to person, place, and time.  Skin: Skin is warm and dry.  Psychiatric: He has a normal mood and affect. His behavior is normal. Judgment and thought content normal.  Nursing note and vitals reviewed.    ED Treatments / Results  Labs (all labs ordered are listed, but only abnormal results are displayed) Labs Reviewed - No data to display  EKG  EKG Interpretation None       Radiology Ct Head Wo Contrast  Result Date: 11/28/2017 CLINICAL DATA:  72 year old male with head trauma. EXAM: CT HEAD WITHOUT CONTRAST CT CERVICAL SPINE WITHOUT CONTRAST TECHNIQUE: Multidetector CT imaging of the head and cervical spine was performed following the standard protocol without intravenous contrast. Multiplanar CT image reconstructions of the cervical spine were also generated. COMPARISON:  Head CT dated 11/21/2017 FINDINGS: CT HEAD FINDINGS Brain: There is no acute intracranial hemorrhage. Stable atrophy with ventricular  dilatation and left interhemispheric cyst. Agenesis of corpus callosum. Vascular: No hyperdense vessel or unexpected calcification. Skull: Normal. Negative for fracture or focal lesion. Sinuses/Orbits: Mild diffuse mucoperiosteal thickening of paranasal sinuses. No air-fluid level. The mastoid air cells are clear. Other: None CT CERVICAL SPINE FINDINGS Alignment: No acute subluxation. Skull base and  vertebrae: No acute fracture.  Osteopenia. Soft tissues and spinal canal: No prevertebral fluid or swelling. No visible canal hematoma. Disc levels: Multilevel degenerative changes with endplate irregularity and disc space narrowing. Upper chest: The visualized lung apices are clear. Other: There is a 3.3 x 4.4 cm partially calcified left thyroid nodule with mass effect and mild compression and displacement of the trachea to the right. Further evaluation with thyroid ultrasound recommended. IMPRESSION: 1. No acute intracranial hemorrhage. 2. Stable atrophy and dilatation of the ventricular system as seen on the prior CT. 3. No acute/traumatic cervical spine pathology. 4. Left thyroid nodule. Electronically Signed   By: Elgie Collard M.D.   On: 11/28/2017 05:09   Ct Cervical Spine Wo Contrast  Result Date: 11/28/2017 CLINICAL DATA:  72 year old male with head trauma. EXAM: CT HEAD WITHOUT CONTRAST CT CERVICAL SPINE WITHOUT CONTRAST TECHNIQUE: Multidetector CT imaging of the head and cervical spine was performed following the standard protocol without intravenous contrast. Multiplanar CT image reconstructions of the cervical spine were also generated. COMPARISON:  Head CT dated 11/21/2017 FINDINGS: CT HEAD FINDINGS Brain: There is no acute intracranial hemorrhage. Stable atrophy with ventricular dilatation and left interhemispheric cyst. Agenesis of corpus callosum. Vascular: No hyperdense vessel or unexpected calcification. Skull: Normal. Negative for fracture or focal lesion. Sinuses/Orbits: Mild diffuse  mucoperiosteal thickening of paranasal sinuses. No air-fluid level. The mastoid air cells are clear. Other: None CT CERVICAL SPINE FINDINGS Alignment: No acute subluxation. Skull base and vertebrae: No acute fracture.  Osteopenia. Soft tissues and spinal canal: No prevertebral fluid or swelling. No visible canal hematoma. Disc levels: Multilevel degenerative changes with endplate irregularity and disc space narrowing. Upper chest: The visualized lung apices are clear. Other: There is a 3.3 x 4.4 cm partially calcified left thyroid nodule with mass effect and mild compression and displacement of the trachea to the right. Further evaluation with thyroid ultrasound recommended. IMPRESSION: 1. No acute intracranial hemorrhage. 2. Stable atrophy and dilatation of the ventricular system as seen on the prior CT. 3. No acute/traumatic cervical spine pathology. 4. Left thyroid nodule. Electronically Signed   By: Elgie Collard M.D.   On: 11/28/2017 05:09    Procedures Procedures (including critical care time) LACERATION REPAIR Performed by: Roxy Horseman Authorized by: Roxy Horseman Consent: Verbal consent obtained. Risks and benefits: risks, benefits and alternatives were discussed Consent given by: patient Patient identity confirmed: provided demographic data Prepped and Draped in normal sterile fashion Wound explored  Laceration Location: right eyebrow  Laceration Length: 3cm  No Foreign Bodies seen or palpated  Anesthesia: local infiltration  Local anesthetic: lidocaine 1% with epinephrine  Anesthetic total: 3 ml  Irrigation method: syringe Amount of cleaning: standard  Skin closure: 5-0 vicryl rapide  Number of sutures: 7  Technique: interrupted  Patient tolerance: Patient tolerated the procedure well with no immediate complications.  Medications Ordered in ED Medications  lidocaine-EPINEPHrine (XYLOCAINE W/EPI) 2 %-1:200000 (PF) injection 10 mL (not administered)  Tdap  (BOOSTRIX) injection 0.5 mL (0.5 mLs Intramuscular Given 11/28/17 0546)     Initial Impression / Assessment and Plan / ED Course  I have reviewed the triage vital signs and the nursing notes.  Pertinent labs & imaging results that were available during my care of the patient were reviewed by me and considered in my medical decision making (see chart for details).     Patient with fall.  No LOC.  Neurovascularly intact.  Imaging negative for acute findings.  VSS. Lac repaired.  Tdap updated.  DC to home.  Final Clinical Impressions(s) / ED Diagnoses   Final diagnoses:  Fall, initial encounter  Injury of head, initial encounter  Laceration of right eyebrow, initial encounter    ED Discharge Orders    None       Roxy Horseman, PA-C 11/28/17 1610    Geoffery Lyons, MD 11/28/17 [phone]

## 2017-11-28 NOTE — ED Triage Notes (Addendum)
[facility]patient "fell out of the bed"-struck head on wall and has a laceration above his right eyebrow. Unwitnessed fall.

## 2017-12-01 ENCOUNTER — Emergency Department (HOSPITAL_COMMUNITY): Payer: Medicare Other

## 2017-12-01 ENCOUNTER — Encounter (HOSPITAL_COMMUNITY): Payer: Self-pay | Admitting: Emergency Medicine

## 2017-12-01 ENCOUNTER — Emergency Department (HOSPITAL_COMMUNITY)
Admission: EM | Admit: 2017-12-01 | Discharge: 2017-12-01 | Disposition: A | Discharge status: Skilled Nursing Facility | Payer: Medicare Other | Attending: Emergency Medicine | Admitting: Emergency Medicine

## 2017-12-01 DIAGNOSIS — I129 Hypertensive chronic kidney disease with stage 1 through stage 4 chronic kidney disease, or unspecified chronic kidney disease: Secondary | ICD-10-CM | POA: Diagnosis not present

## 2017-12-01 DIAGNOSIS — Z87891 Personal history of nicotine dependence: Secondary | ICD-10-CM | POA: Diagnosis not present

## 2017-12-01 DIAGNOSIS — Z79899 Other long term (current) drug therapy: Secondary | ICD-10-CM | POA: Insufficient documentation

## 2017-12-01 DIAGNOSIS — Z794 Long term (current) use of insulin: Secondary | ICD-10-CM | POA: Diagnosis not present

## 2017-12-01 DIAGNOSIS — S0990XA Unspecified injury of head, initial encounter: Secondary | ICD-10-CM | POA: Insufficient documentation

## 2017-12-01 DIAGNOSIS — S0180XA Unspecified open wound of other part of head, initial encounter: Secondary | ICD-10-CM | POA: Diagnosis not present

## 2017-12-01 DIAGNOSIS — Y999 Unspecified external cause status: Secondary | ICD-10-CM | POA: Diagnosis not present

## 2017-12-01 DIAGNOSIS — S3992XA Unspecified injury of lower back, initial encounter: Secondary | ICD-10-CM | POA: Diagnosis not present

## 2017-12-01 DIAGNOSIS — Y92129 Unspecified place in nursing home as the place of occurrence of the external cause: Secondary | ICD-10-CM | POA: Insufficient documentation

## 2017-12-01 DIAGNOSIS — F0390 Unspecified dementia without behavioral disturbance: Secondary | ICD-10-CM | POA: Insufficient documentation

## 2017-12-01 DIAGNOSIS — I259 Chronic ischemic heart disease, unspecified: Secondary | ICD-10-CM | POA: Insufficient documentation

## 2017-12-01 DIAGNOSIS — Y939 Activity, unspecified: Secondary | ICD-10-CM | POA: Insufficient documentation

## 2017-12-01 DIAGNOSIS — S8991XA Unspecified injury of right lower leg, initial encounter: Secondary | ICD-10-CM | POA: Diagnosis not present

## 2017-12-01 DIAGNOSIS — S79912A Unspecified injury of left hip, initial encounter: Secondary | ICD-10-CM | POA: Diagnosis not present

## 2017-12-01 DIAGNOSIS — E041 Nontoxic single thyroid nodule: Secondary | ICD-10-CM

## 2017-12-01 DIAGNOSIS — N189 Chronic kidney disease, unspecified: Secondary | ICD-10-CM | POA: Diagnosis not present

## 2017-12-01 DIAGNOSIS — G8911 Acute pain due to trauma: Secondary | ICD-10-CM | POA: Diagnosis not present

## 2017-12-01 DIAGNOSIS — S199XXA Unspecified injury of neck, initial encounter: Secondary | ICD-10-CM | POA: Diagnosis not present

## 2017-12-01 DIAGNOSIS — W050XXA Fall from non-moving wheelchair, initial encounter: Secondary | ICD-10-CM | POA: Diagnosis not present

## 2017-12-01 DIAGNOSIS — Z7982 Long term (current) use of aspirin: Secondary | ICD-10-CM | POA: Insufficient documentation

## 2017-12-01 DIAGNOSIS — E1159 Type 2 diabetes mellitus with other circulatory complications: Secondary | ICD-10-CM | POA: Insufficient documentation

## 2017-12-01 DIAGNOSIS — S79911A Unspecified injury of right hip, initial encounter: Secondary | ICD-10-CM | POA: Diagnosis not present

## 2017-12-01 DIAGNOSIS — S4992XA Unspecified injury of left shoulder and upper arm, initial encounter: Secondary | ICD-10-CM | POA: Diagnosis not present

## 2017-12-01 DIAGNOSIS — W19XXXA Unspecified fall, initial encounter: Secondary | ICD-10-CM

## 2017-12-01 DIAGNOSIS — M25559 Pain in unspecified hip: Secondary | ICD-10-CM | POA: Diagnosis not present

## 2017-12-01 MED ORDER — OXYCODONE-ACETAMINOPHEN 5-325 MG PO TABS
1.0000 | ORAL_TABLET | Freq: Once | ORAL | Status: AC
  Administered 2017-12-01: 1 via ORAL
  Filled 2017-12-01: qty 1

## 2017-12-01 NOTE — ED Notes (Signed)
Family at bedside. 

## 2017-12-01 NOTE — ED Notes (Signed)
Patient transported to CT/XR ?

## 2017-12-01 NOTE — ED Notes (Signed)
Bed: WA05 Expected date:  Expected time:  Means of arrival:  Comments: Fall 

## 2017-12-01 NOTE — Discharge Instructions (Signed)
The x-rays and CT scans of the head and neck were clear of any acute fractures or findings from the fall.  Please refer to your instructions from this past Tuesday regarding suture removal of the head sutures.  Please return to the emergency department if there is any acute change in Arthur Berry mental status, persistent vomiting, difficulty speaking, difficulty swallowing, acute weakness, or any other concerns.  Arthur Berry has a thyroid nodule that requires follow-up with ENT.  I provided a referral in this paperwork.

## 2017-12-01 NOTE — ED Triage Notes (Signed)
Per EMS, [facility], staff reports patient had fall out of his wheelchair, hitting head on nearby chair. Patient A&O to baseline, A&Ox1. Patient c/o right hip, right knee, lower back, and tailbone.

## 2017-12-01 NOTE — ED Notes (Signed)
ED Provider at bedside. 

## 2017-12-02 NOTE — ED Provider Notes (Signed)
[facility]-EMERGENCY DEPT Provider Note   CSN: [id number] Arrival date & time: 12/01/17  1702     History   Chief Complaint Chief Complaint  Patient presents with  . Fall    HPI Arthur Berry is a 72 y.o. male.  HPI   Patient is a 72 year old male with a history of dementia, CAD, CKD, prostatic enlargement, OSA, and stroke presenting for a fall.  Patient is accompanied by his brother, who is medical power of attorney and assisted in history taking.  Patient has had frequent falls at his nursing care facility where he rolls out of bed.  Patient is status post right hip total arthroplasty secondary to a fall.  Patient is currently recovering from head trauma obtained 4 days ago that required sutures to the right forehead.  This evening's incident, patient reported to have a fall out of wheelchair, hitting head on a nearby chair.  Patient does not remember hitting his head.  Patient is unaware of whether he lost consciousness.  Patient reporting tenderness in bilateral hips, mild tenderness of lumbar spine, and left shoulder.  Patient denies any headaches, visual disturbance, or speech difficulties at this time.  Patient not on any anticoagulation at this time, as ASA 325 mg held per nursing care facility.  Level 5 caveat dementia.  Past Medical History:  Diagnosis Date  . Adjustment reaction with aggression 05/14/2017  . Arthritis   . Chronic kidney disease   . Colon polyp 2005   Tubular Adenoma  . Coronary artery disease, non-occlusive    Minimal nonobstructive CAD, nl LV systolic fxn by cath 12/11/12  . Diabetes mellitus 2001   TYPE 2  . Diverticulosis   . Epilepsy (HCC)   . GERD (gastroesophageal reflux disease) 04/23/2014  . Goiter, nontoxic, multinodular    Thyroid US 11/2012  . Hyperlipidemia 1999  . Hypertension 1999  . Internal hemorrhoids   . Neuromuscular disorder (HCC)   . OSA (obstructive sleep apnea)   . Prostate enlargement   . Sleep apnea     . Splinter 04/27/13   metal plinter removed rt pointer finger  . Stroke (HCC) 06/2015  . Vision changes 06/2015   since CVA    Patient Active Problem List   Diagnosis Date Noted  . Status post total replacement of right hip 11/12/2017  . Closed right hip fracture, initial encounter (HCC) 10/23/2017  . Closed subcapital fracture of right femur (HCC)   . Adjustment reaction with aggression 05/14/2017  . Type 2 diabetes mellitus with other circulatory complications (HCC) 06/19/2015  . Thrombocytopenia (HCC) 06/19/2015  . Cerebral infarction due to unspecified mechanism   . Dilantin toxicity 06/18/2015  . Phenytoin toxicity   . Stroke (HCC) 06/16/2015  . Fall 06/16/2015  . Epilepsy (HCC) 06/16/2015  . BPH (benign prostatic hyperplasia) 06/16/2015  . Leukocytosis 06/16/2015  . CVA (cerebral infarction) 06/16/2015  . CVA (cerebral vascular accident) (HCC) 06/15/2015  . GERD (gastroesophageal reflux disease) 04/23/2014  . Potassium (K) deficiency 12/11/2012  . Precordial pain 12/11/2012  . Dehydration 04/15/2012  . AKI (acute kidney injury) (HCC) 04/14/2012  . HLD (hyperlipidemia) 06/01/2010  . Essential hypertension, benign 06/01/2010  . DYSPNEA 06/01/2010  . Diabetes mellitus without complication (HCC) 11/04/2009  . RECTAL BLEEDING 11/04/2009  . PERSONAL HX COLONIC POLYPS 11/04/2009    Past Surgical History:  Procedure Laterality Date  . CHOLECYSTECTOMY    . COLONOSCOPY  12/28/2009  . ESOPHAGOGASTRODUODENOSCOPY  10/14/2012   normal   . INGUINAL  HERNIA REPAIR  06/27/11   left  . KNEE SURGERY  2008   Left  . LEFT HEART CATHETERIZATION WITH CORONARY ANGIOGRAM N/A 12/11/2012   Procedure: LEFT HEART CATHETERIZATION WITH CORONARY ANGIOGRAM;  Surgeon: Tonny Bollman, MD;  Location: [facility] CATH LAB;  Service: Cardiovascular;  Laterality: N/A;  . TOTAL HIP ARTHROPLASTY Right 10/23/2017   Procedure: TOTAL HIP ARTHROPLASTY ANTERIOR APPROACH;  Surgeon: Kathryne Hitch, MD;  Location:  [facility] OR;  Service: Orthopedics;  Laterality: Right;       Home Medications    Prior to Admission medications   Medication Sig Start Date End Date Taking? Authorizing Provider  amLODipine (NORVASC) 2.5 MG tablet Take 2.5 mg by mouth daily.    [provider]  aspirin 325 MG tablet Take 1 tablet (325 mg total) by mouth daily. Patient not taking: Reported on 11/28/2017 06/22/15   TatOnalee Hua, MD  atorvastatin (LIPITOR) 40 MG tablet Take 40 mg by mouth daily at 6 PM.     [provider]  carvedilol (COREG) 3.125 MG tablet Take 3.125 mg by mouth 2 (two) times daily with a meal.    [provider]  cholecalciferol (VITAMIN D) 1000 units tablet Take 5,000 Units by mouth daily.    [provider]  docusate sodium (COLACE) 100 MG capsule Take 1 capsule (100 mg total) by mouth 2 (two) times daily. Patient not taking: Reported on 11/21/2017 10/27/17   Elease Etienne, MD  escitalopram (LEXAPRO) 10 MG tablet Take 10 mg by mouth daily.    [provider]  feeding supplement, ENSURE ENLIVE, (ENSURE ENLIVE) LIQD Take 237 mLs by mouth 2 (two) times daily between meals. Patient not taking: Reported on 11/21/2017 10/27/17   Elease Etienne, MD  galantamine (RAZADYNE ER) 16 MG 24 hr capsule Take 16 mg by mouth daily with breakfast.    [provider]  HYDROcodone-acetaminophen (NORCO/VICODIN) 5-325 MG tablet Take 1-2 tablets by mouth every 4 (four) hours as needed for moderate pain. Patient not taking: Reported on 11/21/2017 10/24/17   Kathryne Hitch, MD  insulin aspart (NOVOLOG FLEXPEN) 100 UNIT/ML FlexPen Inject 0-14 Units into the skin See admin instructions. Inject 0-14 units twice daily per sliding scale. If sugar less than 120 units = 0 units, 121-150= 2 units, 151-180= 3 units, 181-210= 4 units, 211-250= 5 units, 251-280= 6 units, 281-330= 8 units, 331-400= 10 units, If BS is greater than 400 inject 14 units    [provider]  LANTUS  SOLOSTAR 100 UNIT/ML Solostar Pen Inject 5 Units into the skin daily. In am 07/06/16   [provider]  levETIRAcetam (KEPPRA) 500 MG tablet Take 500 mg by mouth 2 (two) times daily.      [provider]  lisinopril (PRINIVIL,ZESTRIL) 20 MG tablet Take 20 mg by mouth daily.    [provider]  LORazepam (ATIVAN) 0.5 MG tablet Take 1 tablet (0.5 mg total) by mouth at bedtime. 10/27/17   Hongalgi, Maximino Greenland, MD  magnesium hydroxide (MILK OF MAGNESIA) 400 MG/5ML suspension Take 30 mLs by mouth at bedtime as needed for mild constipation.     [provider]  magnesium oxide (MAG-OX) 400 MG tablet Take 400 mg by mouth daily.    [provider]  Melatonin 3 MG TABS Take 3 mg by mouth at bedtime.    [provider]  metFORMIN (GLUCOPHAGE) 500 MG tablet Take 500 mg by mouth 2 (two) times daily.     [provider]  mirtazapine (REMERON) 30 MG tablet Take 30 mg by mouth at bedtime.     [provider]  pantoprazole (PROTONIX) 40 MG tablet Take 1 tablet (40 mg total) by mouth daily. 06/22/15   Catarina Hartshorn, MD  phenytoin (DILANTIN) 100 MG ER capsule Take 200 mg by mouth daily. 10/22/17   [provider]  phenytoin (DILANTIN) 50 MG tablet Chew 3 tablets (150 mg total) by mouth daily. Patient not taking: Reported on 11/21/2017 10/28/17   Elease Etienne, MD  potassium chloride SA (K-DUR,KLOR-CON) 20 MEQ tablet Take 20 mEq by mouth daily.     [provider]  risperiDONE (RISPERDAL) 2 MG tablet Take 2 mg by mouth at bedtime.     [provider]  traMADol (ULTRAM) 50 MG tablet Take 1 tablet (50 mg total) by mouth every 6 (six) hours as needed. 11/21/17   Geoffery Lyons, MD    Family History Family History  Problem Relation Age of Onset  . Heart disease Mother   . Alzheimer's disease Father   . Breast cancer Sister   . Colon cancer Unknown        unknown  . Diabetes Sister     Social History Social History    Tobacco Use  . Smoking status: Former Smoker    Types: Cigars    Last attempt to quit: 04/14/1986    Years since quitting: 31.6  . Smokeless tobacco: Never Used  . Tobacco comment: Quit cigars in the 1980s  Substance Use Topics  . Alcohol use: No  . Drug use: No     Allergies   Patient has no known allergies.   Review of Systems Review of Systems  Gastrointestinal: Negative for vomiting.  Musculoskeletal: Positive for arthralgias and back pain. Negative for neck pain and neck stiffness.  Skin: Negative for wound.  Neurological: Negative for weakness, numbness and headaches.   Level 5 caveat dementia.  Physical Exam Updated Vital Signs BP (!) 145/80   Pulse 77   Temp 98.3 F (36.8 C) (Oral)   Resp 16   SpO2 97%   Physical Exam  Constitutional: He appears well-developed and well-nourished. No distress.  HENT:  Head: Normocephalic.  Mouth/Throat: Oropharynx is clear and moist.  Patient exhibits a well-healing laceration with sutures in place over the right frontal scalp.  No surrounding erythema or drainage.  There is some ecchymosis surrounding the right eye, that appears to be in a state of resolution.  Eyes: Conjunctivae and EOM are normal. Pupils are equal, round, and reactive to light.  Neck: Normal range of motion. Neck supple.  Cardiovascular: Normal rate, regular rhythm, S1 normal and S2 normal.  No murmur heard. Pulmonary/Chest: Effort normal and breath sounds normal. He has no wheezes. He has no rales.  Abdominal: Soft. He exhibits no distension. There is no tenderness. There is no guarding.  Musculoskeletal: Normal range of motion. He exhibits no edema or deformity.  Spine: There is no midline tenderness to cervical, thoracic, or lumbar spine.  There is some bilateral lumbar paraspinal muscular discomfort.  No step-off or crepitus.  Left shoulder: There is no erythema, ecchymosis, or edema of left shoulder.  There is tenderness over humeral head and  posterior left shoulder.  Patient exhibits active range of motion with abduction, and adduction.  No crepitus.  Hips: Patient exhibits tenderness to right lateral, anterior, and posterior hip. Patient is with mild tenderness to left lateral, anterior, and posterior hip.  No crepitus.  Knees: No ecchymosis, erythema,  or edema bilateral knees.  There is suprapatellar tenderness to the left knee.  No varus or valgus laxity of bilateral knees.  No crepitus.  Lymphadenopathy:    He has no cervical adenopathy.  Neurological: He is alert.  Cranial nerves grossly intact. Strength 5/5 upper and lower extremities. Sensation intact to light touch in distal bilateral upper and lower extremities. 2+ and symmetric DTRs in the biceps and patella.  Skin: Skin is warm and dry. No rash noted. No erythema.  Psychiatric: He has a normal mood and affect. His behavior is normal. Judgment and thought content normal.  Nursing note and vitals reviewed.    ED Treatments / Results  Labs (all labs ordered are listed, but only abnormal results are displayed) Labs Reviewed - No data to display  EKG  EKG Interpretation None       Radiology Dg Lumbar Spine Complete  Result Date: 12/01/2017 CLINICAL DATA:  Fall from wheelchair EXAM: LUMBAR SPINE - COMPLETE 4+ VIEW COMPARISON:  Plain film 06/15/2015 FINDINGS: Multiple levels of bulky endplate spurring the entire lower thoracic and lumbar spine. No acute loss vertebral body height and disc height. No subluxation. IMPRESSION: No radiographic evidence of acute lumbar spine injury. Electronically Signed   By: Genevive Bi M.D.   On: 12/01/2017 20:27   Ct Head Wo Contrast  Result Date: 12/01/2017 CLINICAL DATA:  Fall out of wheelchair. EXAM: CT HEAD WITHOUT CONTRAST CT CERVICAL SPINE WITHOUT CONTRAST TECHNIQUE: Multidetector CT imaging of the head and cervical spine was performed following the standard protocol without intravenous contrast. Multiplanar CT image  reconstructions of the cervical spine were also generated. COMPARISON:  None. 11/28/2017. FINDINGS: CT HEAD FINDINGS Brain: No evidence of acute infarction, hemorrhage, hydrocephalus, extra-axial collection or mass lesion/mass effect. Stable appearance of brain atrophy and left interhemispheric cyst. Agenesis of the corpus callosum identified. Vascular: No hyperdense vessel or unexpected calcification. Skull: Normal. Negative for fracture or focal lesion. Sinuses/Orbits: No acute finding. Other: None CT CERVICAL SPINE FINDINGS Alignment: Normal Skull base and vertebrae: No acute fracture. No primary bone lesion or focal pathologic process. Soft tissues and spinal canal: No prevertebral fluid or swelling. No visible canal hematoma. Disc levels: Multi level disc space narrowing and ventral endplate spurring is identified at C3-4, C4-5, C5-6 and C6-7. Upper chest: Negative. Other: There is a nodule within the left lobe of thyroid gland measuring 3.8 cm. IMPRESSION: 1. No acute intracranial hemorrhage. 2. Stable atrophy and dilatation of the ventricular system as seen on the prior CT. 3. No acute cervical spine pathology. 4. Large nodule noted within left lobe of thyroid gland. Consider further evaluation with thyroid ultrasound. If patient is clinically hyperthyroid, consider nuclear medicine thyroid uptake and scan. Electronically Signed   By: Signa Kell M.D.   On: 12/01/2017 20:34   Ct Cervical Spine Wo Contrast  Result Date: 12/01/2017 CLINICAL DATA:  Fall out of wheelchair. EXAM: CT HEAD WITHOUT CONTRAST CT CERVICAL SPINE WITHOUT CONTRAST TECHNIQUE: Multidetector CT imaging of the head and cervical spine was performed following the standard protocol without intravenous contrast. Multiplanar CT image reconstructions of the cervical spine were also generated. COMPARISON:  None. 11/28/2017. FINDINGS: CT HEAD FINDINGS Brain: No evidence of acute infarction, hemorrhage, hydrocephalus, extra-axial collection or  mass lesion/mass effect. Stable appearance of brain atrophy and left interhemispheric cyst. Agenesis of the corpus callosum identified. Vascular: No hyperdense vessel or unexpected calcification. Skull: Normal. Negative for fracture or focal lesion. Sinuses/Orbits: No acute finding. Other: None CT CERVICAL SPINE FINDINGS  Alignment: Normal Skull base and vertebrae: No acute fracture. No primary bone lesion or focal pathologic process. Soft tissues and spinal canal: No prevertebral fluid or swelling. No visible canal hematoma. Disc levels: Multi level disc space narrowing and ventral endplate spurring is identified at C3-4, C4-5, C5-6 and C6-7. Upper chest: Negative. Other: There is a nodule within the left lobe of thyroid gland measuring 3.8 cm. IMPRESSION: 1. No acute intracranial hemorrhage. 2. Stable atrophy and dilatation of the ventricular system as seen on the prior CT. 3. No acute cervical spine pathology. 4. Large nodule noted within left lobe of thyroid gland. Consider further evaluation with thyroid ultrasound. If patient is clinically hyperthyroid, consider nuclear medicine thyroid uptake and scan. Electronically Signed   By: Signa Kellaylor  Stroud M.D.   On: 12/01/2017 20:34   Dg Shoulder Left  Result Date: 12/01/2017 CLINICAL DATA:  Fall from wheelchair. EXAM: LEFT SHOULDER - 2+ VIEW COMPARISON:  None. FINDINGS: Glenohumeral joint is intact. No evidence of scapular fracture or humeral fracture. The acromioclavicular joint is intact. IMPRESSION: No fracture or dislocation. Electronically Signed   By: Genevive BiStewart  Edmunds M.D.   On: 12/01/2017 20:23   Dg Knee Complete 4 Views Right  Result Date: 12/01/2017 CLINICAL DATA:  Fall. EXAM: RIGHT KNEE - COMPLETE 4+ VIEW COMPARISON:  04/14/2012 FINDINGS: No joint effusion. There is no acute fracture or subluxation identified. Mild to moderate tricompartment osteoarthritis identified. IMPRESSION: 1. No acute findings identified. 2. Osteoarthritis. Electronically Signed    By: Signa Kellaylor  Stroud M.D.   On: 12/01/2017 20:25   Dg Hips Bilat With Pelvis Min 5 Views  Result Date: 12/01/2017 CLINICAL DATA:  Fall. EXAM: DG HIP (WITH OR WITHOUT PELVIS) 5+V BILAT COMPARISON:  None. FINDINGS: Previous right hip arthroplasty. No periprosthetic fracture or subluxation. Mild osteoarthritis involves the left hip. No acute fractures or dislocation. IMPRESSION: 1. No acute bone abnormality. 2. Previous right hip arthroplasty. Electronically Signed   By: Signa Kellaylor  Stroud M.D.   On: 12/01/2017 20:21    Procedures Procedures (including critical care time)  Medications Ordered in ED Medications  oxyCODONE-acetaminophen (PERCOCET/ROXICET) 5-325 MG per tablet 1 tablet (1 tablet Oral Given 12/01/17 2154)     Initial Impression / Assessment and Plan / ED Course  I have reviewed the triage vital signs and the nursing notes.  Pertinent labs & imaging results that were available during my care of the patient were reviewed by me and considered in my medical decision making (see chart for details).     Final Clinical Impressions(s) / ED Diagnoses   Final diagnoses:  Fall, initial encounter  Thyroid nodule   Patient is nontoxic-appearing and at baseline per his brothers report.  Patient demonstrating no weakness on exam today.  Patient has had recent repeated falls, and recent total arthroplasty of the right hip due to falls.  Hardware appears intact in the right hip today.  CT scan of the head is negative for acute intracranial abnormality.  CT scan of the cervical spine negative for acute bony normality.  There is a left thyroid nodule noted incidentally on CT scan.  Patient does have a history of multinodular goiters, however patient's brother is unaware of follow-up recently.  Will provide follow-up with ENT for ultrasound.  All other radiographic evaluation negative for acute bony abnormalities.  Patient discharged home to nursing care facility.  Return precautions given for any  changes in mental status, persistent vomiting, difficulty speaking, acute weakness, or any other changes in neurologic status.  Additionally, patient's  scalp sutures appear to be well-healing and will remain in place until removal at 1 week.  Patient and his family are in understanding and agree with the plan of care.  This is a shared visit with Dr. Rolan Bucco. Patient was independently evaluated by this attending physician. Attending physician consulted in evaluation and discharge management.   ED Discharge Orders    None       Delia Chimes 12/02/17 Landry Mellow, MD 12/02/17 [phone]

## 2017-12-03 DIAGNOSIS — S72001D Fracture of unspecified part of neck of right femur, subsequent encounter for closed fracture with routine healing: Secondary | ICD-10-CM | POA: Diagnosis not present

## 2017-12-03 DIAGNOSIS — G40909 Epilepsy, unspecified, not intractable, without status epilepticus: Secondary | ICD-10-CM | POA: Diagnosis not present

## 2017-12-04 DIAGNOSIS — S72001D Fracture of unspecified part of neck of right femur, subsequent encounter for closed fracture with routine healing: Secondary | ICD-10-CM | POA: Diagnosis not present

## 2017-12-04 DIAGNOSIS — G40909 Epilepsy, unspecified, not intractable, without status epilepticus: Secondary | ICD-10-CM | POA: Diagnosis not present

## 2017-12-05 DIAGNOSIS — G40909 Epilepsy, unspecified, not intractable, without status epilepticus: Secondary | ICD-10-CM | POA: Diagnosis not present

## 2017-12-05 DIAGNOSIS — R569 Unspecified convulsions: Secondary | ICD-10-CM | POA: Diagnosis not present

## 2017-12-05 DIAGNOSIS — S72001D Fracture of unspecified part of neck of right femur, subsequent encounter for closed fracture with routine healing: Secondary | ICD-10-CM | POA: Diagnosis not present

## 2017-12-05 DIAGNOSIS — S0191XA Laceration without foreign body of unspecified part of head, initial encounter: Secondary | ICD-10-CM | POA: Diagnosis not present

## 2017-12-05 DIAGNOSIS — Z5181 Encounter for therapeutic drug level monitoring: Secondary | ICD-10-CM | POA: Diagnosis not present

## 2017-12-05 DIAGNOSIS — E041 Nontoxic single thyroid nodule: Secondary | ICD-10-CM | POA: Diagnosis not present

## 2017-12-05 DIAGNOSIS — R296 Repeated falls: Secondary | ICD-10-CM | POA: Diagnosis not present

## 2017-12-05 DIAGNOSIS — Z4802 Encounter for removal of sutures: Secondary | ICD-10-CM | POA: Diagnosis not present

## 2017-12-05 DIAGNOSIS — Z79899 Other long term (current) drug therapy: Secondary | ICD-10-CM | POA: Diagnosis not present

## 2017-12-07 ENCOUNTER — Other Ambulatory Visit: Payer: Self-pay | Admitting: Internal Medicine

## 2017-12-07 DIAGNOSIS — E041 Nontoxic single thyroid nodule: Secondary | ICD-10-CM

## 2017-12-10 ENCOUNTER — Ambulatory Visit (INDEPENDENT_AMBULATORY_CARE_PROVIDER_SITE_OTHER): Payer: Medicare Other | Admitting: Physician Assistant

## 2017-12-10 ENCOUNTER — Encounter (INDEPENDENT_AMBULATORY_CARE_PROVIDER_SITE_OTHER): Payer: Self-pay | Admitting: Physician Assistant

## 2017-12-10 DIAGNOSIS — G40909 Epilepsy, unspecified, not intractable, without status epilepticus: Secondary | ICD-10-CM | POA: Diagnosis not present

## 2017-12-10 DIAGNOSIS — S72001D Fracture of unspecified part of neck of right femur, subsequent encounter for closed fracture with routine healing: Secondary | ICD-10-CM | POA: Diagnosis not present

## 2017-12-10 DIAGNOSIS — Z96641 Presence of right artificial hip joint: Secondary | ICD-10-CM

## 2017-12-10 NOTE — Progress Notes (Signed)
[name] returns today follow-up of his right total hip arthroplasty.  His brother who accompanies him today states that he has fallen multiple times since he was last seen unsure of what is causing his injuries.  Again exam skilled facility status post multiple strokes.  He states he is received some therapy but his brother said he is is been very limited therapy.  He denies any pain in his right hip today.  He presents today in a wheelchair.  Physical exam: Right hip surgical incision healing well.  He is able to stand on his own.  He is very unsteady on his feet and unable to take a step though.  Good range of motion of the right hip without pain.  Impression: Status post right total hip arthroplasty 10/23/2017  Plan: He will continue work with therapy for range of motion strengthening the right hip also on gait and balance.  We will see him back in a month to check his progress lack of.  Questions encouraged and answered.

## 2017-12-13 DIAGNOSIS — G40909 Epilepsy, unspecified, not intractable, without status epilepticus: Secondary | ICD-10-CM | POA: Diagnosis not present

## 2017-12-13 DIAGNOSIS — S72001D Fracture of unspecified part of neck of right femur, subsequent encounter for closed fracture with routine healing: Secondary | ICD-10-CM | POA: Diagnosis not present

## 2017-12-17 DIAGNOSIS — S72001D Fracture of unspecified part of neck of right femur, subsequent encounter for closed fracture with routine healing: Secondary | ICD-10-CM | POA: Diagnosis not present

## 2017-12-17 DIAGNOSIS — G40909 Epilepsy, unspecified, not intractable, without status epilepticus: Secondary | ICD-10-CM | POA: Diagnosis not present

## 2017-12-19 DIAGNOSIS — S72001D Fracture of unspecified part of neck of right femur, subsequent encounter for closed fracture with routine healing: Secondary | ICD-10-CM | POA: Diagnosis not present

## 2017-12-19 DIAGNOSIS — G40909 Epilepsy, unspecified, not intractable, without status epilepticus: Secondary | ICD-10-CM | POA: Diagnosis not present

## 2017-12-24 DIAGNOSIS — S72001D Fracture of unspecified part of neck of right femur, subsequent encounter for closed fracture with routine healing: Secondary | ICD-10-CM | POA: Diagnosis not present

## 2017-12-24 DIAGNOSIS — G40909 Epilepsy, unspecified, not intractable, without status epilepticus: Secondary | ICD-10-CM | POA: Diagnosis not present

## 2017-12-26 DIAGNOSIS — G40909 Epilepsy, unspecified, not intractable, without status epilepticus: Secondary | ICD-10-CM | POA: Diagnosis not present

## 2017-12-26 DIAGNOSIS — S72001D Fracture of unspecified part of neck of right femur, subsequent encounter for closed fracture with routine healing: Secondary | ICD-10-CM | POA: Diagnosis not present

## 2017-12-28 DIAGNOSIS — E048 Other specified nontoxic goiter: Secondary | ICD-10-CM | POA: Diagnosis not present

## 2017-12-28 DIAGNOSIS — M810 Age-related osteoporosis without current pathological fracture: Secondary | ICD-10-CM | POA: Diagnosis not present

## 2017-12-28 DIAGNOSIS — E119 Type 2 diabetes mellitus without complications: Secondary | ICD-10-CM | POA: Diagnosis not present

## 2017-12-28 DIAGNOSIS — I10 Essential (primary) hypertension: Secondary | ICD-10-CM | POA: Diagnosis not present

## 2017-12-28 DIAGNOSIS — R413 Other amnesia: Secondary | ICD-10-CM | POA: Diagnosis not present

## 2017-12-31 DIAGNOSIS — S72001D Fracture of unspecified part of neck of right femur, subsequent encounter for closed fracture with routine healing: Secondary | ICD-10-CM | POA: Diagnosis not present

## 2017-12-31 DIAGNOSIS — G40909 Epilepsy, unspecified, not intractable, without status epilepticus: Secondary | ICD-10-CM | POA: Diagnosis not present

## 2018-01-02 DIAGNOSIS — G40909 Epilepsy, unspecified, not intractable, without status epilepticus: Secondary | ICD-10-CM | POA: Diagnosis not present

## 2018-01-02 DIAGNOSIS — S72001D Fracture of unspecified part of neck of right femur, subsequent encounter for closed fracture with routine healing: Secondary | ICD-10-CM | POA: Diagnosis not present

## 2018-01-04 DIAGNOSIS — S72001D Fracture of unspecified part of neck of right femur, subsequent encounter for closed fracture with routine healing: Secondary | ICD-10-CM | POA: Diagnosis not present

## 2018-01-04 DIAGNOSIS — G40909 Epilepsy, unspecified, not intractable, without status epilepticus: Secondary | ICD-10-CM | POA: Diagnosis not present

## 2018-01-08 DIAGNOSIS — S72001D Fracture of unspecified part of neck of right femur, subsequent encounter for closed fracture with routine healing: Secondary | ICD-10-CM | POA: Diagnosis not present

## 2018-01-08 DIAGNOSIS — M79674 Pain in right toe(s): Secondary | ICD-10-CM | POA: Diagnosis not present

## 2018-01-08 DIAGNOSIS — G40909 Epilepsy, unspecified, not intractable, without status epilepticus: Secondary | ICD-10-CM | POA: Diagnosis not present

## 2018-01-08 DIAGNOSIS — B351 Tinea unguium: Secondary | ICD-10-CM | POA: Diagnosis not present

## 2018-01-10 DIAGNOSIS — S72001D Fracture of unspecified part of neck of right femur, subsequent encounter for closed fracture with routine healing: Secondary | ICD-10-CM | POA: Diagnosis not present

## 2018-01-10 DIAGNOSIS — G40909 Epilepsy, unspecified, not intractable, without status epilepticus: Secondary | ICD-10-CM | POA: Diagnosis not present

## 2018-01-14 ENCOUNTER — Encounter (INDEPENDENT_AMBULATORY_CARE_PROVIDER_SITE_OTHER): Payer: Self-pay | Admitting: Physician Assistant

## 2018-01-14 ENCOUNTER — Ambulatory Visit (INDEPENDENT_AMBULATORY_CARE_PROVIDER_SITE_OTHER): Payer: Medicare Other | Admitting: Physician Assistant

## 2018-01-14 DIAGNOSIS — Z96641 Presence of right artificial hip joint: Secondary | ICD-10-CM

## 2018-01-14 NOTE — Progress Notes (Signed)
Office Visit Note   Patient: Arthur Berry           Date of Birth: 1946/07/29           MRN: [id number] Visit Date: 01/14/2018              Requested by: Rodrigo RanPerini, Mark, MD [address] [place], [place] [id number] PCP: Rodrigo RanPerini, Mark, MD   Assessment & Plan: Visit Diagnoses:  1. Status post total replacement of right hip     Plan: Is given a note stating that he needs to work with physical therapy daily on walking gait and balance.  He needs to walk with a walker daily.  Like to see him back in 3 months and at that time hopefully he will be able to ambulate in with either a walker or cane.  Questions were encouraged and answered by the patient and his sister and brother-in-law who were present throughout the examination today.  Follow-Up Instructions: Return in about 3 months (around 04/16/2018).   Orders:  No orders of the defined types were placed in this encounter.  No orders of the defined types were placed in this encounter.     Procedures: No procedures performed   Clinical Data: No additional findings.   Subjective: Chief Complaint  Patient presents with  . Right Hip - Follow-up    HPI Arthur Berry returns now 83 days status post right total hip arthroplasty.   he states that he is doing some walking with therapy at the skilled facility stays at.  He presents today with his sister and brother-in-law.  They state that the they are unsure of how often he is up and ambulating.  Patient denies any pain  in the right hip.  He feels overall he is trending towards improvement.  He has had no falls since last office visit.  His only complaint is some stiffness of the hip. Review of Systems Please see HPI otherwise negative  Objective: Vital Signs: There were no vitals taken for this visit.  Physical Exam  Constitutional: He appears well-developed and well-nourished. No distress.  Pulmonary/Chest: Effort normal.  Skin: He is not diaphoretic.    Ortho Exam Right hip is  fluid range of motion of the hip without pain.  He is able to transfer from sitting to standing position.  He ambulates with a slow antalgic type gait.  Right calf supple nontender.  Able to dorsiflex plantarflex the right ankle. Specialty Comments:  No specialty comments available.  Imaging: No results found.   PMFS History: Patient Active Problem List   Diagnosis Date Noted  . Status post total replacement of right hip 11/12/2017  . Closed right hip fracture, initial encounter (HCC) 10/23/2017  . Closed subcapital fracture of right femur (HCC)   . Adjustment reaction with aggression 05/14/2017  . Type 2 diabetes mellitus with other circulatory complications (HCC) 06/19/2015  . Thrombocytopenia (HCC) 06/19/2015  . Cerebral infarction due to unspecified mechanism   . Dilantin toxicity 06/18/2015  . Phenytoin toxicity   . Stroke (HCC) 06/16/2015  . Fall 06/16/2015  . Epilepsy (HCC) 06/16/2015  . BPH (benign prostatic hyperplasia) 06/16/2015  . Leukocytosis 06/16/2015  . CVA (cerebral infarction) 06/16/2015  . CVA (cerebral vascular accident) (HCC) 06/15/2015  . GERD (gastroesophageal reflux disease) 04/23/2014  . Potassium (K) deficiency 12/11/2012  . Precordial pain 12/11/2012  . Dehydration 04/15/2012  . AKI (acute kidney injury) (HCC) 04/14/2012  . HLD (hyperlipidemia) 06/01/2010  . Essential hypertension, benign 06/01/2010  .  DYSPNEA 06/01/2010  . Diabetes mellitus without complication (HCC) 11/04/2009  . RECTAL BLEEDING 11/04/2009  . PERSONAL HX COLONIC POLYPS 11/04/2009   Past Medical History:  Diagnosis Date  . Adjustment reaction with aggression 05/14/2017  . Arthritis   . Chronic kidney disease   . Colon polyp 2005   Tubular Adenoma  . Coronary artery disease, non-occlusive    Minimal nonobstructive CAD, nl LV systolic fxn by cath 12/11/12  . Diabetes mellitus 2001   TYPE 2  . Diverticulosis   . Epilepsy (HCC)   . GERD (gastroesophageal reflux disease)  04/23/2014  . Goiter, nontoxic, multinodular    Thyroid US 11/2012  . Hyperlipidemia 1999  . Hypertension 1999  . Internal hemorrhoids   . Neuromuscular disorder (HCC)   . OSA (obstructive sleep apnea)   . Prostate enlargement   . Sleep apnea   . Splinter 04/27/13   metal plinter removed rt pointer finger  . Stroke (HCC) 06/2015  . Vision changes 06/2015   since CVA    Family History  Problem Relation Age of Onset  . Heart disease Mother   . Alzheimer's disease Father   . Breast cancer Sister   . Colon cancer Unknown        unknown  . Diabetes Sister     Past Surgical History:  Procedure Laterality Date  . CHOLECYSTECTOMY    . COLONOSCOPY  12/28/2009  . ESOPHAGOGASTRODUODENOSCOPY  10/14/2012   normal   . INGUINAL HERNIA REPAIR  06/27/11   left  . KNEE SURGERY  2008   Left  . LEFT HEART CATHETERIZATION WITH CORONARY ANGIOGRAM N/A 12/11/2012   Procedure: LEFT HEART CATHETERIZATION WITH CORONARY ANGIOGRAM;  Surgeon: Tonny Bollman, MD;  [facility] C[facility];  Service: Cardiovascular;  Laterality: N/A;  . TOTAL HIP ARTHROPLASTY Right 10/23/2017   Procedure: TOTAL HIP ARTHROPLASTY ANTERIOR APPROACH;  Surgeon: Kathryne Hitch, MD;  Location: [facility] OR;  Service: Orthopedics;  Laterality: Right;   Social History   Occupational History  . Occupation: Multimedia programmer: [employer]  Tobacco Use  . Smoking status: Former Smoker    Types: Cigars    Last attempt to quit: 04/14/1986    Years since quitting: 31.7  . Smokeless tobacco: Never Used  . Tobacco comment: Quit cigars in the 1980s  Substance and Sexual Activity  . Alcohol use: No  . Drug use: No  . Sexual activity: No

## 2018-01-16 ENCOUNTER — Encounter (HOSPITAL_COMMUNITY): Payer: Self-pay

## 2018-01-17 DIAGNOSIS — S72001D Fracture of unspecified part of neck of right femur, subsequent encounter for closed fracture with routine healing: Secondary | ICD-10-CM | POA: Diagnosis not present

## 2018-01-17 DIAGNOSIS — G40909 Epilepsy, unspecified, not intractable, without status epilepticus: Secondary | ICD-10-CM | POA: Diagnosis not present

## 2018-01-22 DIAGNOSIS — M6281 Muscle weakness (generalized): Secondary | ICD-10-CM | POA: Diagnosis not present

## 2018-01-22 DIAGNOSIS — R2681 Unsteadiness on feet: Secondary | ICD-10-CM | POA: Diagnosis not present

## 2018-01-25 DIAGNOSIS — R2681 Unsteadiness on feet: Secondary | ICD-10-CM | POA: Diagnosis not present

## 2018-01-25 DIAGNOSIS — M6281 Muscle weakness (generalized): Secondary | ICD-10-CM | POA: Diagnosis not present

## 2018-01-28 DIAGNOSIS — R2681 Unsteadiness on feet: Secondary | ICD-10-CM | POA: Diagnosis not present

## 2018-01-28 DIAGNOSIS — M6281 Muscle weakness (generalized): Secondary | ICD-10-CM | POA: Diagnosis not present

## 2018-01-29 ENCOUNTER — Other Ambulatory Visit (HOSPITAL_COMMUNITY): Payer: Self-pay | Admitting: *Deleted

## 2018-01-30 ENCOUNTER — Ambulatory Visit (HOSPITAL_COMMUNITY)
Admission: RE | Admit: 2018-01-30 | Discharge: 2018-01-30 | Disposition: A | Discharge status: Home / Self Care | Payer: Medicare Other | Source: Ambulatory Visit | Attending: Internal Medicine | Admitting: Internal Medicine

## 2018-01-30 DIAGNOSIS — M6281 Muscle weakness (generalized): Secondary | ICD-10-CM | POA: Diagnosis not present

## 2018-01-30 DIAGNOSIS — M810 Age-related osteoporosis without current pathological fracture: Secondary | ICD-10-CM | POA: Diagnosis not present

## 2018-01-30 DIAGNOSIS — R2681 Unsteadiness on feet: Secondary | ICD-10-CM | POA: Diagnosis not present

## 2018-01-30 MED ORDER — ZOLEDRONIC ACID 5 MG/100ML IV SOLN
5.0000 mg | Freq: Once | INTRAVENOUS | Status: AC
  Administered 2018-01-30: 5 mg via INTRAVENOUS

## 2018-01-30 MED ORDER — ZOLEDRONIC ACID 5 MG/100ML IV SOLN
INTRAVENOUS | Status: AC
  Administered 2018-01-30: 5 mg via INTRAVENOUS
  Filled 2018-01-30: qty 100

## 2018-02-01 DIAGNOSIS — M6281 Muscle weakness (generalized): Secondary | ICD-10-CM | POA: Diagnosis not present

## 2018-02-01 DIAGNOSIS — R2681 Unsteadiness on feet: Secondary | ICD-10-CM | POA: Diagnosis not present

## 2018-02-03 DIAGNOSIS — M6281 Muscle weakness (generalized): Secondary | ICD-10-CM | POA: Diagnosis not present

## 2018-02-03 DIAGNOSIS — R2681 Unsteadiness on feet: Secondary | ICD-10-CM | POA: Diagnosis not present

## 2018-02-06 DIAGNOSIS — R2681 Unsteadiness on feet: Secondary | ICD-10-CM | POA: Diagnosis not present

## 2018-02-06 DIAGNOSIS — M6281 Muscle weakness (generalized): Secondary | ICD-10-CM | POA: Diagnosis not present

## 2018-02-08 DIAGNOSIS — R2681 Unsteadiness on feet: Secondary | ICD-10-CM | POA: Diagnosis not present

## 2018-02-08 DIAGNOSIS — M6281 Muscle weakness (generalized): Secondary | ICD-10-CM | POA: Diagnosis not present

## 2018-02-11 DIAGNOSIS — R2681 Unsteadiness on feet: Secondary | ICD-10-CM | POA: Diagnosis not present

## 2018-02-11 DIAGNOSIS — M6281 Muscle weakness (generalized): Secondary | ICD-10-CM | POA: Diagnosis not present

## 2018-02-13 DIAGNOSIS — M6281 Muscle weakness (generalized): Secondary | ICD-10-CM | POA: Diagnosis not present

## 2018-02-13 DIAGNOSIS — R2681 Unsteadiness on feet: Secondary | ICD-10-CM | POA: Diagnosis not present

## 2018-02-15 DIAGNOSIS — M6281 Muscle weakness (generalized): Secondary | ICD-10-CM | POA: Diagnosis not present

## 2018-02-15 DIAGNOSIS — R2681 Unsteadiness on feet: Secondary | ICD-10-CM | POA: Diagnosis not present

## 2018-02-18 DIAGNOSIS — M6281 Muscle weakness (generalized): Secondary | ICD-10-CM | POA: Diagnosis not present

## 2018-02-18 DIAGNOSIS — R2681 Unsteadiness on feet: Secondary | ICD-10-CM | POA: Diagnosis not present

## 2018-02-20 DIAGNOSIS — M6281 Muscle weakness (generalized): Secondary | ICD-10-CM | POA: Diagnosis not present

## 2018-02-20 DIAGNOSIS — R2681 Unsteadiness on feet: Secondary | ICD-10-CM | POA: Diagnosis not present

## 2018-02-22 DIAGNOSIS — R2681 Unsteadiness on feet: Secondary | ICD-10-CM | POA: Diagnosis not present

## 2018-02-22 DIAGNOSIS — M6281 Muscle weakness (generalized): Secondary | ICD-10-CM | POA: Diagnosis not present

## 2018-02-25 DIAGNOSIS — R2681 Unsteadiness on feet: Secondary | ICD-10-CM | POA: Diagnosis not present

## 2018-02-25 DIAGNOSIS — M6281 Muscle weakness (generalized): Secondary | ICD-10-CM | POA: Diagnosis not present

## 2018-02-27 DIAGNOSIS — R2681 Unsteadiness on feet: Secondary | ICD-10-CM | POA: Diagnosis not present

## 2018-02-27 DIAGNOSIS — M6281 Muscle weakness (generalized): Secondary | ICD-10-CM | POA: Diagnosis not present

## 2018-03-01 DIAGNOSIS — M6281 Muscle weakness (generalized): Secondary | ICD-10-CM | POA: Diagnosis not present

## 2018-03-01 DIAGNOSIS — R2681 Unsteadiness on feet: Secondary | ICD-10-CM | POA: Diagnosis not present

## 2018-03-04 DIAGNOSIS — R2681 Unsteadiness on feet: Secondary | ICD-10-CM | POA: Diagnosis not present

## 2018-03-04 DIAGNOSIS — M6281 Muscle weakness (generalized): Secondary | ICD-10-CM | POA: Diagnosis not present

## 2018-03-06 DIAGNOSIS — M6281 Muscle weakness (generalized): Secondary | ICD-10-CM | POA: Diagnosis not present

## 2018-03-06 DIAGNOSIS — R2681 Unsteadiness on feet: Secondary | ICD-10-CM | POA: Diagnosis not present

## 2018-03-08 DIAGNOSIS — M6281 Muscle weakness (generalized): Secondary | ICD-10-CM | POA: Diagnosis not present

## 2018-03-08 DIAGNOSIS — R2681 Unsteadiness on feet: Secondary | ICD-10-CM | POA: Diagnosis not present

## 2018-03-09 ENCOUNTER — Emergency Department (HOSPITAL_COMMUNITY)
Admission: EM | Admit: 2018-03-09 | Discharge: 2018-03-09 | Disposition: A | Discharge status: Home / Self Care | Payer: Medicare Other | Attending: Emergency Medicine | Admitting: Emergency Medicine

## 2018-03-09 ENCOUNTER — Emergency Department (HOSPITAL_COMMUNITY): Payer: Medicare Other

## 2018-03-09 ENCOUNTER — Encounter (HOSPITAL_COMMUNITY): Payer: Self-pay

## 2018-03-09 DIAGNOSIS — S199XXA Unspecified injury of neck, initial encounter: Secondary | ICD-10-CM | POA: Diagnosis not present

## 2018-03-09 DIAGNOSIS — F0390 Unspecified dementia without behavioral disturbance: Secondary | ICD-10-CM | POA: Insufficient documentation

## 2018-03-09 DIAGNOSIS — I129 Hypertensive chronic kidney disease with stage 1 through stage 4 chronic kidney disease, or unspecified chronic kidney disease: Secondary | ICD-10-CM | POA: Insufficient documentation

## 2018-03-09 DIAGNOSIS — Y939 Activity, unspecified: Secondary | ICD-10-CM | POA: Diagnosis not present

## 2018-03-09 DIAGNOSIS — N189 Chronic kidney disease, unspecified: Secondary | ICD-10-CM | POA: Insufficient documentation

## 2018-03-09 DIAGNOSIS — R079 Chest pain, unspecified: Secondary | ICD-10-CM | POA: Diagnosis not present

## 2018-03-09 DIAGNOSIS — W06XXXA Fall from bed, initial encounter: Secondary | ICD-10-CM | POA: Insufficient documentation

## 2018-03-09 DIAGNOSIS — W19XXXA Unspecified fall, initial encounter: Secondary | ICD-10-CM

## 2018-03-09 DIAGNOSIS — G464 Cerebellar stroke syndrome: Secondary | ICD-10-CM | POA: Diagnosis not present

## 2018-03-09 DIAGNOSIS — R0789 Other chest pain: Secondary | ICD-10-CM | POA: Diagnosis not present

## 2018-03-09 DIAGNOSIS — Y999 Unspecified external cause status: Secondary | ICD-10-CM | POA: Diagnosis not present

## 2018-03-09 DIAGNOSIS — S4992XA Unspecified injury of left shoulder and upper arm, initial encounter: Secondary | ICD-10-CM | POA: Diagnosis not present

## 2018-03-09 DIAGNOSIS — E119 Type 2 diabetes mellitus without complications: Secondary | ICD-10-CM | POA: Insufficient documentation

## 2018-03-09 DIAGNOSIS — Z79899 Other long term (current) drug therapy: Secondary | ICD-10-CM | POA: Diagnosis not present

## 2018-03-09 DIAGNOSIS — R52 Pain, unspecified: Secondary | ICD-10-CM | POA: Diagnosis not present

## 2018-03-09 DIAGNOSIS — Z87891 Personal history of nicotine dependence: Secondary | ICD-10-CM | POA: Diagnosis not present

## 2018-03-09 DIAGNOSIS — S40012A Contusion of left shoulder, initial encounter: Secondary | ICD-10-CM | POA: Insufficient documentation

## 2018-03-09 DIAGNOSIS — M25512 Pain in left shoulder: Secondary | ICD-10-CM | POA: Diagnosis not present

## 2018-03-09 DIAGNOSIS — Y92129 Unspecified place in nursing home as the place of occurrence of the external cause: Secondary | ICD-10-CM | POA: Diagnosis not present

## 2018-03-09 DIAGNOSIS — S0990XA Unspecified injury of head, initial encounter: Secondary | ICD-10-CM | POA: Diagnosis not present

## 2018-03-09 LAB — BASIC METABOLIC PANEL
Anion gap: 7 (ref 5–15)
BUN: 16 mg/dL (ref 6–20)
CALCIUM: 8.7 mg/dL — AB (ref 8.9–10.3)
CHLORIDE: 106 mmol/L (ref 101–111)
CO2: 29 mmol/L (ref 22–32)
CREATININE: 0.78 mg/dL (ref 0.61–1.24)
GFR calc non Af Amer: >60 mL/min (ref >60)
GLUCOSE: 138 mg/dL — AB (ref 65–99)
Potassium: 3.7 mmol/L (ref 3.5–5.1)
Sodium: 142 mmol/L (ref 135–145)

## 2018-03-09 LAB — CBC
HCT: 36.2 % — ABNORMAL LOW (ref 39.0–52.0)
Hemoglobin: 11.9 g/dL — ABNORMAL LOW (ref 13.0–17.0)
MCH: 30.4 pg (ref 26.0–34.0)
MCHC: 32.9 g/dL (ref 30.0–36.0)
MCV: 92.6 fL (ref 78.0–100.0)
PLATELETS: 132 10*3/uL — AB (ref 150–400)
RBC: 3.91 MIL/uL — AB (ref 4.22–5.81)
RDW: 13.8 % (ref 11.5–15.5)
WBC: 8.2 10*3/uL (ref 4.0–10.5)

## 2018-03-09 LAB — I-STAT TROPONIN, ED
TROPONIN I, POC: 0 ng/mL (ref 0.00–0.08)
Troponin i, poc: 0 ng/mL (ref 0.00–0.08)

## 2018-03-09 NOTE — ED Provider Notes (Signed)
[facility] EMERGENCY DEPARTMENT Provider Note   CSN: [id number] Arrival date & time: 03/09/18  4098     History   Chief Complaint Chief Complaint  Patient presents with  . Chest Pain  . Fall    HPI JILLIAN PIANKA is a 72 y.o. male.  Patient presents to the ER for evaluation after a fall.  Patient was found on his left side next to his bed.  He has a history of dementia, cannot provide much information.  Reviewing his records reveals multiple presentations to the ER after rolling out of his bed.  EMS, however, report that he has been complaining of chest pain.  He reports intermittently a heaviness and a cold feeling in the center of his chest.  This was not present when he went to bed.  He is not short of breath.     Past Medical History:  Diagnosis Date  . Adjustment reaction with aggression 05/14/2017  . Arthritis   . Chronic kidney disease   . Colon polyp 2005   Tubular Adenoma  . Coronary artery disease, non-occlusive    Minimal nonobstructive CAD, nl LV systolic fxn by cath 12/11/12  . Diabetes mellitus 2001   TYPE 2  . Diverticulosis   . Epilepsy (HCC)   . GERD (gastroesophageal reflux disease) 04/23/2014  . Goiter, nontoxic, multinodular    Thyroid US 11/2012  . Hyperlipidemia 1999  . Hypertension 1999  . Internal hemorrhoids   . Neuromuscular disorder (HCC)   . OSA (obstructive sleep apnea)   . Prostate enlargement   . Sleep apnea   . Splinter 04/27/13   metal plinter removed rt pointer finger  . Stroke (HCC) 06/2015  . Vision changes 06/2015   since CVA    Patient Active Problem List   Diagnosis Date Noted  . Status post total replacement of right hip 11/12/2017  . Closed right hip fracture, initial encounter (HCC) 10/23/2017  . Closed subcapital fracture of right femur (HCC)   . Adjustment reaction with aggression 05/14/2017  . Type 2 diabetes mellitus with other circulatory complications (HCC) 06/19/2015  . Thrombocytopenia (HCC)  06/19/2015  . Cerebral infarction due to unspecified mechanism   . Dilantin toxicity 06/18/2015  . Phenytoin toxicity   . Stroke (HCC) 06/16/2015  . Fall 06/16/2015  . Epilepsy (HCC) 06/16/2015  . BPH (benign prostatic hyperplasia) 06/16/2015  . Leukocytosis 06/16/2015  . CVA (cerebral infarction) 06/16/2015  . CVA (cerebral vascular accident) (HCC) 06/15/2015  . GERD (gastroesophageal reflux disease) 04/23/2014  . Potassium (K) deficiency 12/11/2012  . Precordial pain 12/11/2012  . Dehydration 04/15/2012  . AKI (acute kidney injury) (HCC) 04/14/2012  . HLD (hyperlipidemia) 06/01/2010  . Essential hypertension, benign 06/01/2010  . DYSPNEA 06/01/2010  . Diabetes mellitus without complication (HCC) 11/04/2009  . RECTAL BLEEDING 11/04/2009  . PERSONAL HX COLONIC POLYPS 11/04/2009    Past Surgical History:  Procedure Laterality Date  . CHOLECYSTECTOMY    . COLONOSCOPY  12/28/2009  . ESOPHAGOGASTRODUODENOSCOPY  10/14/2012   normal   . INGUINAL HERNIA REPAIR  06/27/11   left  . KNEE SURGERY  2008   Left  . LEFT HEART CATHETERIZATION WITH CORONARY ANGIOGRAM N/A 12/11/2012   Procedure: LEFT HEART CATHETERIZATION WITH CORONARY ANGIOGRAM;  Surgeon: Tonny Bollman, MD;  Location: [facility] C[facility] LAB;  Service: Cardiovascular;  Laterality: N/A;  . TOTAL HIP ARTHROPLASTY Right 10/23/2017   Procedure: TOTAL HIP ARTHROPLASTY ANTERIOR APPROACH;  Surgeon: Kathryne Hitch, MD;  Location: [facility] OR;  Service: Orthopedics;  Laterality: Right;        Home Medications    Prior to Admission medications   Medication Sig Start Date End Date Taking? Authorizing Provider  amLODipine (NORVASC) 2.5 MG tablet Take 2.5 mg by mouth daily.   Yes [provider]  carvedilol (COREG) 3.125 MG tablet Take 3.125 mg by mouth 2 (two) times daily with a meal.   Yes [provider]  cholecalciferol (VITAMIN D) 1000 units tablet Take 5,000 Units by mouth daily.   Yes [provider]    escitalopram (LEXAPRO) 10 MG tablet Take 10 mg by mouth daily.   Yes [provider]  galantamine (RAZADYNE ER) 16 MG 24 hr capsule Take 16 mg by mouth daily with breakfast.   Yes [provider]  levETIRAcetam (KEPPRA) 500 MG tablet Take 500 mg by mouth 2 (two) times daily.     Yes [provider]  LORazepam (ATIVAN) 0.5 MG tablet Take 1 tablet (0.5 mg total) by mouth at bedtime. Patient taking differently: Take 0.25 mg by mouth at bedtime.  10/27/17  Yes Hongalgi, Maximino Greenland, MD  magnesium hydroxide (MILK OF MAGNESIA) 400 MG/5ML suspension Take 30 mLs by mouth at bedtime as needed for mild constipation.    Yes [provider]  magnesium oxide (MAG-OX) 400 MG tablet Take 400 mg by mouth daily.   Yes [provider]  Melatonin 3 MG TABS Take 3 mg by mouth at bedtime.   Yes [provider]  metFORMIN (GLUCOPHAGE) 500 MG tablet Take 500 mg by mouth 2 (two) times daily.    Yes [provider]  mirtazapine (REMERON) 30 MG tablet Take 30 mg by mouth at bedtime.    Yes [provider]  neomycin-bacitracin-polymyxin (NEOSPORIN) ointment Apply 1 application topically daily as needed for wound care.   Yes [provider]  pantoprazole (PROTONIX) 40 MG tablet Take 1 tablet (40 mg total) by mouth daily. 06/22/15  Yes Tat, Onalee Hua, MD  phenytoin (DILANTIN) 50 MG tablet Chew 3 tablets (150 mg total) by mouth daily. Patient taking differently: Chew 200 mg by mouth daily.  10/28/17  Yes Hongalgi, Maximino Greenland, MD  potassium chloride SA (K-DUR,KLOR-CON) 20 MEQ tablet Take 20 mEq by mouth daily.    Yes [provider]  risperiDONE (RISPERDAL) 2 MG tablet Take 1 mg by mouth at bedtime.    Yes [provider]  traMADol (ULTRAM) 50 MG tablet Take 1 tablet (50 mg total) by mouth every 6 (six) hours as needed. Patient taking differently: Take 50 mg by mouth every 6 (six) hours as needed for moderate pain.  11/21/17  Yes Geoffery Lyons,  MD    Family History Family History  Problem Relation Age of Onset  . Heart disease Mother   . Alzheimer's disease Father   . Breast cancer Sister   . Colon cancer Unknown        unknown  . Diabetes Sister     Social History Social History   Tobacco Use  . Smoking status: Former Smoker    Types: Cigars    Last attempt to quit: 04/14/1986    Years since quitting: 31.9  . Smokeless tobacco: Never Used  . Tobacco comment: Quit cigars in the 1980s  Substance Use Topics  . Alcohol use: No  . Drug use: No     Allergies   Patient has no known allergies.   Review of Systems Review of Systems  Cardiovascular: Positive for chest pain.  All other systems reviewed and are negative.    Physical Exam Updated Vital Signs BP 135/85   Pulse 60   Resp 12   SpO2 97%   Physical Exam  Constitutional: He is oriented to person, place, and time. He appears well-developed and well-nourished. No distress.  HENT:  Head: Normocephalic and atraumatic.  Right Ear: Hearing normal.  Left Ear: Hearing normal.  Nose: Nose normal.  Mouth/Throat: Oropharynx is clear and moist and mucous membranes are normal.  Eyes: Pupils are equal, round, and reactive to light. Conjunctivae and EOM are normal.  Neck: Normal range of motion. Neck supple.  Cardiovascular: Regular rhythm, S1 normal and S2 normal. Exam reveals no gallop and no friction rub.  No murmur heard. Pulmonary/Chest: Effort normal and breath sounds normal. No respiratory distress. He exhibits no tenderness.  Abdominal: Soft. Normal appearance and bowel sounds are normal. There is no hepatosplenomegaly. There is no tenderness. There is no rebound, no guarding, no tenderness at McBurney's point and negative Murphy's sign. No hernia.  Musculoskeletal: Normal range of motion.       Left shoulder: He exhibits tenderness. He exhibits normal range of motion, no swelling and no deformity.  Neurological: He is alert and oriented to person,  place, and time. He has normal strength. No cranial nerve deficit or sensory deficit. Coordination normal. GCS eye subscore is 4. GCS verbal subscore is 5. GCS motor subscore is 6.  Skin: Skin is warm, dry and intact. No rash noted. No cyanosis.  Psychiatric: He has a normal mood and affect. His speech is normal and behavior is normal. Thought content normal.  Nursing note and vitals reviewed.    ED Treatments / Results  Labs (all labs ordered are listed, but only abnormal results are displayed) Labs Reviewed  BASIC METABOLIC PANEL - Abnormal; Notable for the following components:      Result Value   Glucose, Bld 138 (*)    Calcium 8.7 (*)    All other components within normal limits  CBC - Abnormal; Notable for the following components:   RBC 3.91 (*)    Hemoglobin 11.9 (*)    HCT 36.2 (*)    Platelets 132 (*)    All other components within normal limits  I-STAT TROPONIN, ED  I-STAT TROPONIN, ED    EKG EKG Interpretation  Date/Time:  Saturday March 09 2018 05:02:29 EDT Ventricular Rate:  74 PR Interval:    QRS Duration: 86 QT Interval:  409 QTC Calculation: 454 R Axis:   69 Text Interpretation:  Sinus rhythm Low voltage, precordial leads Minimal ST depression, lateral leads Baseline wander in lead(s) V3 No significant change since last tracing Confirmed by Gilda Crease [phone]) on 2020-05-2518 5:36:17 AM Also confirmed by Gilda Crease [phone]), editor Sheppard Evens ([id number])  on 2020-05-2518 12:43:12 PM   Radiology Dg Chest 2 View  Result Date: 2020-05-2518 CLINICAL DATA:  Patient fell to the left side today. Left shoulder pain, central chest pain, and neck pain. EXAM: CHEST - 2 VIEW COMPARISON:  10/23/2017 FINDINGS: Shallow inspiration. Heart size and pulmonary vascularity are normal. Lungs are clear and expanded. No blunting of costophrenic angles. No pneumothorax. Mediastinal contours appear intact. Prominent soft tissue in the left paratracheal region with  displacement of the trachea towards the right, likely representing thyroid goiter. No change since prior study. Old right rib fractures. IMPRESSION: No evidence of active pulmonary disease. Probable left thyroid goiter. Shallow inspiration. Electronically Signed   By: Marisa Cyphers.D.  On: 2020-04-2318 06:02   Ct Head Wo Contrast  Result Date: 08-Apr-202019 CLINICAL DATA:  Initial evaluation for acute trauma, fall. EXAM: CT HEAD WITHOUT CONTRAST CT CERVICAL SPINE WITHOUT CONTRAST TECHNIQUE: Multidetector CT imaging of the head and cervical spine was performed following the standard protocol without intravenous contrast. Multiplanar CT image reconstructions of the cervical spine were also generated. COMPARISON:  Prior CT from 12/01/2017 FINDINGS: CT HEAD FINDINGS Brain: Agenesis of the corpus callosum with diffuse ventriculomegaly and colpocephaly again seen, stable from previous. Associated large interhemispheric cyst unchanged. Mild chronic small vessel ischemic disease again noted. Chronic cerebellar atrophy, stable from previous, and may be related to chronic antiepileptic therapy. No acute intracranial hemorrhage. No acute large vessel territory infarct. No mass lesion, midline shift or mass effect. No extra-axial fluid collection. Vascular: No hyperdense vessel. Scattered vascular calcifications noted within the carotid siphons. Skull: Scalp soft tissues and calvarium within normal limits. Sinuses/Orbits: Globes and orbital soft tissues within normal limits. Mild scattered mucosal thickening within the ethmoidal air cells and maxillary sinuses. Paranasal sinuses are otherwise clear. No mastoid effusion. Other: None. CT CERVICAL SPINE FINDINGS Alignment: Straightening of the normal cervical lordosis. Trace anterolisthesis of C4 on C5, likely chronic and facet mediated. Skull base and vertebrae: Skull base intact. Normal C1-2 articulations are preserved in the dens is intact. Vertebral body heights  maintained. No acute fracture. Soft tissues and spinal canal: No acute soft tissue abnormality within the neck. No abnormal prevertebral edema. Spinal canal demonstrates no acute abnormality. Large heterogeneous left thyroid nodule measures up to approximately 4.4 cm, similar to previous. Atherosclerotic change about the carotid bifurcations. Disc levels: Moderate degenerate spondylolysis present at C3-4 through C6-7, most severe at C3-4. Upper chest: Visualized upper chest demonstrates no acute abnormality. Visualized lung apices are clear. Other: None. IMPRESSION: CT BRAIN: 1. No acute intracranial process. 2. Agenesis of the corpus callosum with associated ventricular dilatation and inter hemispheric cyst, stable. 3. Chronic cerebellar atrophy, which may be related to chronic antiepileptic therapy. 4. Mild chronic small vessel ischemic disease. CT CERVICAL SPINE: 1. No acute traumatic injury within cervical spine. 2. Moderate degenerate spondylolysis at C3-4 through C6-7. 3. 4.4 cm left thyroid nodule. Again, further assessment with nonemergent thyroid ultrasound recommended if not already performed. Electronically Signed   By: Rise Mu M.D.   On: 2020-04-2318 06:51   Ct Cervical Spine Wo Contrast  Result Date: 08-Apr-202019 CLINICAL DATA:  Initial evaluation for acute trauma, fall. EXAM: CT HEAD WITHOUT CONTRAST CT CERVICAL SPINE WITHOUT CONTRAST TECHNIQUE: Multidetector CT imaging of the head and cervical spine was performed following the standard protocol without intravenous contrast. Multiplanar CT image reconstructions of the cervical spine were also generated. COMPARISON:  Prior CT from 12/01/2017 FINDINGS: CT HEAD FINDINGS Brain: Agenesis of the corpus callosum with diffuse ventriculomegaly and colpocephaly again seen, stable from previous. Associated large interhemispheric cyst unchanged. Mild chronic small vessel ischemic disease again noted. Chronic cerebellar atrophy, stable from previous,  and may be related to chronic antiepileptic therapy. No acute intracranial hemorrhage. No acute large vessel territory infarct. No mass lesion, midline shift or mass effect. No extra-axial fluid collection. Vascular: No hyperdense vessel. Scattered vascular calcifications noted within the carotid siphons. Skull: Scalp soft tissues and calvarium within normal limits. Sinuses/Orbits: Globes and orbital soft tissues within normal limits. Mild scattered mucosal thickening within the ethmoidal air cells and maxillary sinuses. Paranasal sinuses are otherwise clear. No mastoid effusion. Other: None. CT CERVICAL SPINE FINDINGS Alignment: Straightening of the normal cervical  lordosis. Trace anterolisthesis of C4 on C5, likely chronic and facet mediated. Skull base and vertebrae: Skull base intact. Normal C1-2 articulations are preserved in the dens is intact. Vertebral body heights maintained. No acute fracture. Soft tissues and spinal canal: No acute soft tissue abnormality within the neck. No abnormal prevertebral edema. Spinal canal demonstrates no acute abnormality. Large heterogeneous left thyroid nodule measures up to approximately 4.4 cm, similar to previous. Atherosclerotic change about the carotid bifurcations. Disc levels: Moderate degenerate spondylolysis present at C3-4 through C6-7, most severe at C3-4. Upper chest: Visualized upper chest demonstrates no acute abnormality. Visualized lung apices are clear. Other: None. IMPRESSION: CT BRAIN: 1. No acute intracranial process. 2. Agenesis of the corpus callosum with associated ventricular dilatation and inter hemispheric cyst, stable. 3. Chronic cerebellar atrophy, which may be related to chronic antiepileptic therapy. 4. Mild chronic small vessel ischemic disease. CT CERVICAL SPINE: 1. No acute traumatic injury within cervical spine. 2. Moderate degenerate spondylolysis at C3-4 through C6-7. 3. 4.4 cm left thyroid nodule. Again, further assessment with nonemergent  thyroid ultrasound recommended if not already performed. Electronically Signed   By: Rise Mu M.D.   On: 08/15/202019 06:51   Dg Shoulder Left  Result Date: 06-25-2018 CLINICAL DATA:  Left shoulder pain after a fall today. EXAM: LEFT SHOULDER - 2+ VIEW COMPARISON:  12/01/2017 FINDINGS: Degenerative changes in the acromioclavicular and glenohumeral joints. No evidence of acute fracture or dislocation. No focal bone lesion or bone destruction. Soft tissues are unremarkable. IMPRESSION: Degenerative changes in the left shoulder. No acute displaced fractures identified. Electronically Signed   By: Burman Nieves M.D.   On: 08/15/202019 06:03    Procedures Procedures (including critical care time)  Medications Ordered in ED Medications - No data to display   Initial Impression / Assessment and Plan / ED Course  I have reviewed the triage vital signs and the nursing notes.  Pertinent labs & imaging results that were available during my care of the patient were reviewed by me and considered in my medical decision making (see chart for details).     Patient presents to the ER for evaluation after a fall.  Patient resides in nursing facility.  He was found on the floor next to his bed.  This has happened multiple times in the past.  Patient does have some memory loss, was unclear about his what happened tonight.  He did tell EMS that he had some chest discomfort initially.  He was reportedly given a nitroglycerin by EMS.  They reported that his pain resolved.  At arrival to the ER, however, he reported continued pain.  There was not any reproducible chest pain at arrival.  After a period of time, however, he now states that his chest pain is gone.  At one point he stated he did not have chest pain but his chest is felt cold.  He seemed confused and had difficulty answering these questions.  An EKG did not suggest ischemia or infarct.  Patient had a troponin that was negative.  Second troponin  performed, also negative.  Patient has consistently been without any pain, now has no complaints.  He will be returned to nursing facility.  Final Clinical Impressions(s) / ED Diagnoses   Final diagnoses:  Fall, initial encounter  Contusion of left shoulder, initial encounter    ED Discharge Orders    None       Nima Kemppainen, Canary Brim, MD 03/09/18 2324

## 2018-03-09 NOTE — ED Notes (Signed)
Discharge paperwork discussed with pt and brother (POA). Brother left phone, attempted to call other number. Phone sent with pt to facility and [facility] aware.

## 2018-03-09 NOTE — ED Triage Notes (Signed)
Pt comes via [facility] from [facility], fell onto L side, c/o of L shoulder pain, central CP relieved by one nitro and 324 ASA. C/o of r arm numbness, no neuro deficits

## 2018-03-11 DIAGNOSIS — R2681 Unsteadiness on feet: Secondary | ICD-10-CM | POA: Diagnosis not present

## 2018-03-11 DIAGNOSIS — M6281 Muscle weakness (generalized): Secondary | ICD-10-CM | POA: Diagnosis not present

## 2018-03-12 DIAGNOSIS — M79674 Pain in right toe(s): Secondary | ICD-10-CM | POA: Diagnosis not present

## 2018-03-12 DIAGNOSIS — B351 Tinea unguium: Secondary | ICD-10-CM | POA: Diagnosis not present

## 2018-03-12 DIAGNOSIS — M79675 Pain in left toe(s): Secondary | ICD-10-CM | POA: Diagnosis not present

## 2018-03-13 DIAGNOSIS — R2681 Unsteadiness on feet: Secondary | ICD-10-CM | POA: Diagnosis not present

## 2018-03-13 DIAGNOSIS — M6281 Muscle weakness (generalized): Secondary | ICD-10-CM | POA: Diagnosis not present

## 2018-03-15 DIAGNOSIS — M6281 Muscle weakness (generalized): Secondary | ICD-10-CM | POA: Diagnosis not present

## 2018-03-15 DIAGNOSIS — R2681 Unsteadiness on feet: Secondary | ICD-10-CM | POA: Diagnosis not present

## 2018-03-18 DIAGNOSIS — R2681 Unsteadiness on feet: Secondary | ICD-10-CM | POA: Diagnosis not present

## 2018-03-18 DIAGNOSIS — M6281 Muscle weakness (generalized): Secondary | ICD-10-CM | POA: Diagnosis not present

## 2018-03-20 DIAGNOSIS — M6281 Muscle weakness (generalized): Secondary | ICD-10-CM | POA: Diagnosis not present

## 2018-03-20 DIAGNOSIS — R2681 Unsteadiness on feet: Secondary | ICD-10-CM | POA: Diagnosis not present

## 2018-03-22 DIAGNOSIS — R2681 Unsteadiness on feet: Secondary | ICD-10-CM | POA: Diagnosis not present

## 2018-03-22 DIAGNOSIS — M6281 Muscle weakness (generalized): Secondary | ICD-10-CM | POA: Diagnosis not present

## 2018-03-25 DIAGNOSIS — M6281 Muscle weakness (generalized): Secondary | ICD-10-CM | POA: Diagnosis not present

## 2018-03-25 DIAGNOSIS — R2681 Unsteadiness on feet: Secondary | ICD-10-CM | POA: Diagnosis not present

## 2018-03-27 DIAGNOSIS — R2681 Unsteadiness on feet: Secondary | ICD-10-CM | POA: Diagnosis not present

## 2018-03-27 DIAGNOSIS — M6281 Muscle weakness (generalized): Secondary | ICD-10-CM | POA: Diagnosis not present

## 2018-03-29 DIAGNOSIS — M6281 Muscle weakness (generalized): Secondary | ICD-10-CM | POA: Diagnosis not present

## 2018-03-29 DIAGNOSIS — R2681 Unsteadiness on feet: Secondary | ICD-10-CM | POA: Diagnosis not present

## 2018-04-01 DIAGNOSIS — R2681 Unsteadiness on feet: Secondary | ICD-10-CM | POA: Diagnosis not present

## 2018-04-01 DIAGNOSIS — M6281 Muscle weakness (generalized): Secondary | ICD-10-CM | POA: Diagnosis not present

## 2018-04-03 DIAGNOSIS — R2681 Unsteadiness on feet: Secondary | ICD-10-CM | POA: Diagnosis not present

## 2018-04-03 DIAGNOSIS — M6281 Muscle weakness (generalized): Secondary | ICD-10-CM | POA: Diagnosis not present

## 2018-04-04 DIAGNOSIS — M810 Age-related osteoporosis without current pathological fracture: Secondary | ICD-10-CM | POA: Diagnosis not present

## 2018-04-04 DIAGNOSIS — E119 Type 2 diabetes mellitus without complications: Secondary | ICD-10-CM | POA: Diagnosis not present

## 2018-04-04 DIAGNOSIS — E041 Nontoxic single thyroid nodule: Secondary | ICD-10-CM | POA: Diagnosis not present

## 2018-04-04 DIAGNOSIS — E048 Other specified nontoxic goiter: Secondary | ICD-10-CM | POA: Diagnosis not present

## 2018-04-04 DIAGNOSIS — R569 Unspecified convulsions: Secondary | ICD-10-CM | POA: Diagnosis not present

## 2018-04-04 DIAGNOSIS — R2681 Unsteadiness on feet: Secondary | ICD-10-CM | POA: Diagnosis not present

## 2018-04-04 DIAGNOSIS — Z6825 Body mass index (BMI) 25.0-25.9, adult: Secondary | ICD-10-CM | POA: Diagnosis not present

## 2018-04-04 DIAGNOSIS — R296 Repeated falls: Secondary | ICD-10-CM | POA: Diagnosis not present

## 2018-04-04 DIAGNOSIS — M6281 Muscle weakness (generalized): Secondary | ICD-10-CM | POA: Diagnosis not present

## 2018-04-04 DIAGNOSIS — I10 Essential (primary) hypertension: Secondary | ICD-10-CM | POA: Diagnosis not present

## 2018-04-10 DIAGNOSIS — M6281 Muscle weakness (generalized): Secondary | ICD-10-CM | POA: Diagnosis not present

## 2018-04-10 DIAGNOSIS — R2681 Unsteadiness on feet: Secondary | ICD-10-CM | POA: Diagnosis not present

## 2018-04-12 DIAGNOSIS — R2681 Unsteadiness on feet: Secondary | ICD-10-CM | POA: Diagnosis not present

## 2018-04-12 DIAGNOSIS — M6281 Muscle weakness (generalized): Secondary | ICD-10-CM | POA: Diagnosis not present

## 2018-04-15 DIAGNOSIS — M6281 Muscle weakness (generalized): Secondary | ICD-10-CM | POA: Diagnosis not present

## 2018-04-15 DIAGNOSIS — R2681 Unsteadiness on feet: Secondary | ICD-10-CM | POA: Diagnosis not present

## 2018-04-17 ENCOUNTER — Encounter (INDEPENDENT_AMBULATORY_CARE_PROVIDER_SITE_OTHER): Payer: Self-pay | Admitting: Physician Assistant

## 2018-04-17 ENCOUNTER — Ambulatory Visit (INDEPENDENT_AMBULATORY_CARE_PROVIDER_SITE_OTHER): Payer: Medicare Other | Admitting: Physician Assistant

## 2018-04-17 DIAGNOSIS — Z96641 Presence of right artificial hip joint: Secondary | ICD-10-CM | POA: Diagnosis not present

## 2018-04-17 DIAGNOSIS — M6281 Muscle weakness (generalized): Secondary | ICD-10-CM | POA: Diagnosis not present

## 2018-04-17 DIAGNOSIS — M25551 Pain in right hip: Secondary | ICD-10-CM | POA: Diagnosis not present

## 2018-04-17 DIAGNOSIS — R2681 Unsteadiness on feet: Secondary | ICD-10-CM | POA: Diagnosis not present

## 2018-04-17 NOTE — Progress Notes (Signed)
HPI: [name] returns today 6 months status post right total hip arthroplasty.  He is overall doing well.  He is walking some and working with physical therapy.  He has no pain in the hip.  He has no complaints.  Physical exam: Right hip he has diminished internal and external rotation.  He is able to stand on his own and can take a few steps.  He is able to take a few steps Without any assistive device.  Right calf supple nontender.    Impression: 6 months status post right total hip arthroplasty  Plan: This point time we will continue to work with physical therapy he mostly needs to walk daily for strengthening range of motion.  He will follow-up with us in 6 months at that time we will obtain an AP  pelvis and lateral view of his right hip.

## 2018-04-19 DIAGNOSIS — M6281 Muscle weakness (generalized): Secondary | ICD-10-CM | POA: Diagnosis not present

## 2018-04-19 DIAGNOSIS — R2681 Unsteadiness on feet: Secondary | ICD-10-CM | POA: Diagnosis not present

## 2018-04-21 DIAGNOSIS — R2681 Unsteadiness on feet: Secondary | ICD-10-CM | POA: Diagnosis not present

## 2018-04-21 DIAGNOSIS — M6281 Muscle weakness (generalized): Secondary | ICD-10-CM | POA: Diagnosis not present

## 2018-04-23 ENCOUNTER — Encounter (HOSPITAL_COMMUNITY): Payer: Self-pay

## 2018-04-23 ENCOUNTER — Emergency Department (HOSPITAL_COMMUNITY): Payer: Medicare Other

## 2018-04-23 ENCOUNTER — Emergency Department (HOSPITAL_COMMUNITY)
Admission: EM | Admit: 2018-04-23 | Discharge: 2018-04-23 | Disposition: A | Discharge status: Home / Self Care | Payer: Medicare Other | Attending: Emergency Medicine | Admitting: Emergency Medicine

## 2018-04-23 DIAGNOSIS — I129 Hypertensive chronic kidney disease with stage 1 through stage 4 chronic kidney disease, or unspecified chronic kidney disease: Secondary | ICD-10-CM | POA: Diagnosis not present

## 2018-04-23 DIAGNOSIS — Z79899 Other long term (current) drug therapy: Secondary | ICD-10-CM | POA: Diagnosis not present

## 2018-04-23 DIAGNOSIS — Z7984 Long term (current) use of oral hypoglycemic drugs: Secondary | ICD-10-CM | POA: Diagnosis not present

## 2018-04-23 DIAGNOSIS — R0902 Hypoxemia: Secondary | ICD-10-CM | POA: Diagnosis not present

## 2018-04-23 DIAGNOSIS — F0390 Unspecified dementia without behavioral disturbance: Secondary | ICD-10-CM | POA: Insufficient documentation

## 2018-04-23 DIAGNOSIS — R0989 Other specified symptoms and signs involving the circulatory and respiratory systems: Secondary | ICD-10-CM | POA: Diagnosis not present

## 2018-04-23 DIAGNOSIS — I10 Essential (primary) hypertension: Secondary | ICD-10-CM | POA: Diagnosis not present

## 2018-04-23 DIAGNOSIS — E1122 Type 2 diabetes mellitus with diabetic chronic kidney disease: Secondary | ICD-10-CM | POA: Insufficient documentation

## 2018-04-23 DIAGNOSIS — I2510 Atherosclerotic heart disease of native coronary artery without angina pectoris: Secondary | ICD-10-CM | POA: Insufficient documentation

## 2018-04-23 DIAGNOSIS — Z96641 Presence of right artificial hip joint: Secondary | ICD-10-CM | POA: Insufficient documentation

## 2018-04-23 DIAGNOSIS — N189 Chronic kidney disease, unspecified: Secondary | ICD-10-CM | POA: Diagnosis not present

## 2018-04-23 DIAGNOSIS — Z743 Need for continuous supervision: Secondary | ICD-10-CM | POA: Diagnosis not present

## 2018-04-23 DIAGNOSIS — Z87891 Personal history of nicotine dependence: Secondary | ICD-10-CM | POA: Insufficient documentation

## 2018-04-23 DIAGNOSIS — R279 Unspecified lack of coordination: Secondary | ICD-10-CM | POA: Diagnosis not present

## 2018-04-23 NOTE — ED Provider Notes (Signed)
[facility] EMERGENCY DEPARTMENT Provider Note   CSN: [id number] Arrival date & time: 04/23/18  1817     History   Chief Complaint Chief Complaint  Patient presents with  . Choking    HPI Arthur Berry is a 72 y.o. male with a history of dementia, CVA, OSA, DM Type II who presents to the emergency department by EMS from [facility] with a chief complaint of choking.   EMS reports that the patient is on a soft diet, but [facility] staff reports that the patient stool a piece of chicken from another resident.  He then began choking on the piece of chicken.  EMS performed abdominal thrust and cleared the food.  No additional information is able to be provided by the patient at this time.  Level 5 caveat secondary to dementia.  The history is provided by the patient. No language interpreter was used.    Past Medical History:  Diagnosis Date  . Adjustment reaction with aggression 05/14/2017  . Arthritis   . Chronic kidney disease   . Colon polyp 2005   Tubular Adenoma  . Coronary artery disease, non-occlusive    Minimal nonobstructive CAD, nl LV systolic fxn by cath 12/11/12  . Diabetes mellitus 2001   TYPE 2  . Diverticulosis   . Epilepsy (HCC)   . GERD (gastroesophageal reflux disease) 04/23/2014  . Goiter, nontoxic, multinodular    Thyroid US 11/2012  . Hyperlipidemia 1999  . Hypertension 1999  . Internal hemorrhoids   . Neuromuscular disorder (HCC)   . OSA (obstructive sleep apnea)   . Prostate enlargement   . Sleep apnea   . Splinter 04/27/13   metal plinter removed rt pointer finger  . Stroke (HCC) 06/2015  . Vision changes 06/2015   since CVA    Patient Active Problem List   Diagnosis Date Noted  . Status post total replacement of right hip 11/12/2017  . Closed right hip fracture, initial encounter (HCC) 10/23/2017  . Closed subcapital fracture of right femur (HCC)   . Adjustment reaction with aggression 05/14/2017  . Type 2 diabetes  mellitus with other circulatory complications (HCC) 06/19/2015  . Thrombocytopenia (HCC) 06/19/2015  . Cerebral infarction due to unspecified mechanism   . Dilantin toxicity 06/18/2015  . Phenytoin toxicity   . Stroke (HCC) 06/16/2015  . Fall 06/16/2015  . Epilepsy (HCC) 06/16/2015  . BPH (benign prostatic hyperplasia) 06/16/2015  . Leukocytosis 06/16/2015  . CVA (cerebral infarction) 06/16/2015  . CVA (cerebral vascular accident) (HCC) 06/15/2015  . GERD (gastroesophageal reflux disease) 04/23/2014  . Potassium (K) deficiency 12/11/2012  . Precordial pain 12/11/2012  . Dehydration 04/15/2012  . AKI (acute kidney injury) (HCC) 04/14/2012  . HLD (hyperlipidemia) 06/01/2010  . Essential hypertension, benign 06/01/2010  . DYSPNEA 06/01/2010  . Diabetes mellitus without complication (HCC) 11/04/2009  . RECTAL BLEEDING 11/04/2009  . PERSONAL HX COLONIC POLYPS 11/04/2009    Past Surgical History:  Procedure Laterality Date  . CHOLECYSTECTOMY    . COLONOSCOPY  12/28/2009  . ESOPHAGOGASTRODUODENOSCOPY  10/14/2012   normal   . INGUINAL HERNIA REPAIR  06/27/11   left  . KNEE SURGERY  2008   Left  . LEFT HEART CATHETERIZATION WITH CORONARY ANGIOGRAM N/A 12/11/2012   Procedure: LEFT HEART CATHETERIZATION WITH CORONARY ANGIOGRAM;  Surgeon: Tonny Bollman, MD;  Location: [facility] CATH LAB;  Service: Cardiovascular;  Laterality: N/A;  . TOTAL HIP ARTHROPLASTY Right 10/23/2017   Procedure: TOTAL HIP ARTHROPLASTY ANTERIOR APPROACH;  Surgeon:  Kathryne Hitch, MD;  Location: [facility] OR;  Service: Orthopedics;  Laterality: Right;        Home Medications    Prior to Admission medications   Medication Sig Start Date End Date Taking? Authorizing Provider  aspirin 325 MG tablet Take 325 mg by mouth daily.   Yes [provider]  carvedilol (COREG) 3.125 MG tablet Take 3.125 mg by mouth 2 (two) times daily with a meal.   Yes [provider]  escitalopram (LEXAPRO) 10 MG tablet Take  10 mg by mouth daily.   Yes [provider]  galantamine (RAZADYNE ER) 16 MG 24 hr capsule Take 16 mg by mouth daily with breakfast.   Yes [provider]  levETIRAcetam (KEPPRA) 500 MG tablet Take 500 mg by mouth 2 (two) times daily.     Yes [provider]  lisinopril (PRINIVIL,ZESTRIL) 20 MG tablet Take 20 mg by mouth daily.   Yes [provider]  LORazepam (ATIVAN) 0.5 MG tablet Take 1 tablet (0.5 mg total) by mouth at bedtime. Patient taking differently: Take 0.25 mg by mouth at bedtime.  10/27/17  Yes Hongalgi, Maximino Greenland, MD  magnesium hydroxide (MILK OF MAGNESIA) 400 MG/5ML suspension Take 30 mLs by mouth at bedtime as needed for mild constipation.    Yes [provider]  magnesium oxide (MAG-OX) 400 MG tablet Take 400 mg by mouth daily.   Yes [provider]  Melatonin 3 MG TABS Take 3 mg by mouth at bedtime.   Yes [provider]  metFORMIN (GLUCOPHAGE) 500 MG tablet Take 500 mg by mouth 2 (two) times daily.    Yes [provider]  mirtazapine (REMERON) 30 MG tablet Take 30 mg by mouth at bedtime.    Yes [provider]  neomycin-bacitracin-polymyxin (NEOSPORIN) ointment Apply 1 application topically daily as needed for wound care.   Yes [provider]  pantoprazole (PROTONIX) 40 MG tablet Take 1 tablet (40 mg total) by mouth daily. 06/22/15  Yes Tat, Onalee Hua, MD  phenytoin (DILANTIN) 50 MG tablet Chew 3 tablets (150 mg total) by mouth daily. Patient taking differently: Chew 200 mg by mouth daily.  10/28/17  Yes Hongalgi, Maximino Greenland, MD  potassium chloride SA (K-DUR,KLOR-CON) 20 MEQ tablet Take 20 mEq by mouth daily.    Yes [provider]  risperiDONE (RISPERDAL) 2 MG tablet Take 1 mg by mouth at bedtime.    Yes [provider]  traMADol (ULTRAM) 50 MG tablet Take 1 tablet (50 mg total) by mouth every 6 (six) hours as needed. Patient taking differently: Take 50 mg by mouth every 6 (six)  hours as needed for moderate pain.  11/21/17  Yes Geoffery Lyons, MD    Family History Family History  Problem Relation Age of Onset  . Heart disease Mother   . Alzheimer's disease Father   . Breast cancer Sister   . Colon cancer Unknown        unknown  . Diabetes Sister     Social History Social History   Tobacco Use  . Smoking status: Former Smoker    Types: Cigars    Last attempt to quit: 04/14/1986    Years since quitting: 32.0  . Smokeless tobacco: Never Used  . Tobacco comment: Quit cigars in the 1980s  Substance Use Topics  . Alcohol use: No  . Drug use: No     Allergies   Patient has no known allergies.   Review of Systems Review of Systems  Unable to perform ROS: Dementia  Respiratory: Positive for choking.      Physical Exam Updated Vital Signs BP (!) 172/95   Pulse 89   Temp 98.9 F (37.2 C) (Oral)   Resp 18   SpO2 98%   Physical Exam  Constitutional: He appears well-developed. No distress.  HENT:  Head: Normocephalic.  Mouth/Throat: Oropharynx is clear and moist.  Eyes: Conjunctivae are normal.  Neck: Normal range of motion. Neck supple.  No tenderness to palpation to the anterolateral neck.  Cardiovascular: Normal rate, regular rhythm, normal heart sounds and intact distal pulses. Exam reveals no gallop and no friction rub.  No murmur heard. Pulmonary/Chest: Effort normal and breath sounds normal. No stridor. No respiratory distress. He has no wheezes. He has no rales. He exhibits no tenderness.  Lungss are clear to auscultation bilaterally.  Speaks in complete, fluent sentences.  Equal chest expansion with inspiration and expiration.  Abdominal: Soft. Bowel sounds are normal. He exhibits no distension and no mass. There is no tenderness. There is no rebound and no guarding. No hernia.  Neurological: He is alert.  Skin: Skin is warm and dry.  Psychiatric: His behavior is normal.  Nursing note and vitals reviewed.  ED Treatments / Results    Labs (all labs ordered are listed, but only abnormal results are displayed) Labs Reviewed - No data to display  EKG None  Radiology Dg Chest 2 View  Result Date: 04/23/2018 CLINICAL DATA:  Choking episode. EXAM: CHEST - 2 VIEW COMPARISON:  Jul 15, 202019 FINDINGS: Rightward tracheal deviation due to goiter. Heart size within normal limits. Equal aeration in the lungs. No pleural effusion or significant airspace opacity. IMPRESSION: 1. Currently no airspace opacity to suggest aspiration pneumonia. 2. Rightward tracheal deviation is chronic and due to a known thyroid goiter. Electronically Signed   By: Gaylyn RongWalter  Liebkemann M.D.   On: 04/23/2018 19:24    Procedures Procedures (including critical care time)  Medications Ordered in ED Medications - No data to display   Initial Impression / Assessment and Plan / ED Course  I have reviewed the triage vital signs and the nursing notes.  Pertinent labs & imaging results that were available during my care of the patient were reviewed by me and considered in my medical decision making (see chart for details).     72 year old male with a history of dementia, CVA, OSA, DM Type II who [facility] a chief complaint of ch[facility].  EMS performed abdominal thrust and cleared the food.  In the ED, the patient has no complaints.  History is limited as the patient has dementia.  Chest x-ray is negative for acute pathology.  Doubt foreign body, aspiration pneumonia, or esophageal impaction.  The patient was discussed with Dr. Jacqulyn BathLong, attending physician.  He is hemodynamically stable in no acute distress. At this time, the patient has no complaints and is safe to return [facility].    Final Clinical Impressions(s) / ED Diagnoses   Final diagnoses:  Choking episode    ED Discharge Orders    None       Barkley BoardsMcDonald, Jacobo Moncrief A, PA-C 04/23/18 2056    Maia PlanLong, Joshua G, MD 04/24/18 [phone]

## 2018-04-23 NOTE — ED Notes (Signed)
Discharge instructions discussed with Pt. Report given to PTAR. Attempted to call report to SNF but never answered. Pt stable and on a stretcher.

## 2018-04-23 NOTE — ED Triage Notes (Signed)
[facility].  Pt lives on Dementia side, eats soft diet, reportedly pt stole chicken from another resident.  BLS truck provided abdominal thrusts and cleared food.

## 2018-04-23 NOTE — Discharge Instructions (Signed)
Thank you for allowing me to provide your care today in the emergency department.  Please make sure to continue to follow your soft diet.    Return to the emergency department if you develop a high fever, chest pain, shortness of breath, thick cough with sputum, or have another choking episode.

## 2018-04-23 NOTE — ED Notes (Signed)
[facility] called for transport.  

## 2018-04-23 NOTE — ED Notes (Signed)
Called PTAR @ 20:56

## 2018-04-24 DIAGNOSIS — M6281 Muscle weakness (generalized): Secondary | ICD-10-CM | POA: Diagnosis not present

## 2018-04-24 DIAGNOSIS — R2681 Unsteadiness on feet: Secondary | ICD-10-CM | POA: Diagnosis not present

## 2018-04-26 DIAGNOSIS — R2681 Unsteadiness on feet: Secondary | ICD-10-CM | POA: Diagnosis not present

## 2018-04-26 DIAGNOSIS — M6281 Muscle weakness (generalized): Secondary | ICD-10-CM | POA: Diagnosis not present

## 2018-04-29 DIAGNOSIS — M6281 Muscle weakness (generalized): Secondary | ICD-10-CM | POA: Diagnosis not present

## 2018-04-29 DIAGNOSIS — R2681 Unsteadiness on feet: Secondary | ICD-10-CM | POA: Diagnosis not present

## 2018-05-01 DIAGNOSIS — R2681 Unsteadiness on feet: Secondary | ICD-10-CM | POA: Diagnosis not present

## 2018-05-01 DIAGNOSIS — M6281 Muscle weakness (generalized): Secondary | ICD-10-CM | POA: Diagnosis not present

## 2018-05-02 DIAGNOSIS — R2681 Unsteadiness on feet: Secondary | ICD-10-CM | POA: Diagnosis not present

## 2018-05-02 DIAGNOSIS — M6281 Muscle weakness (generalized): Secondary | ICD-10-CM | POA: Diagnosis not present

## 2018-05-04 DIAGNOSIS — M6281 Muscle weakness (generalized): Secondary | ICD-10-CM | POA: Diagnosis not present

## 2018-05-04 DIAGNOSIS — R2681 Unsteadiness on feet: Secondary | ICD-10-CM | POA: Diagnosis not present

## 2018-05-06 ENCOUNTER — Telehealth: Payer: Self-pay | Admitting: Cardiology

## 2018-05-06 NOTE — Telephone Encounter (Signed)
Called patients brother and left a VM to call back to schedule followup with Dr. Antoine PocheHochrein.  Patient has not been seen since 08-09-15.

## 2018-05-07 ENCOUNTER — Telehealth: Payer: Self-pay | Admitting: Cardiology

## 2018-05-07 NOTE — Telephone Encounter (Signed)
New Message   Pt's brother Devota [place] from [place] about scheduling an appt but Lorella NimrodHarvey states the pt does not need an appointment at this time and his heart is doing fine

## 2018-05-07 NOTE — Telephone Encounter (Signed)
Scheduler [place] made aware

## 2018-05-08 DIAGNOSIS — M6281 Muscle weakness (generalized): Secondary | ICD-10-CM | POA: Diagnosis not present

## 2018-05-08 DIAGNOSIS — R2681 Unsteadiness on feet: Secondary | ICD-10-CM | POA: Diagnosis not present

## 2018-05-10 DIAGNOSIS — M6281 Muscle weakness (generalized): Secondary | ICD-10-CM | POA: Diagnosis not present

## 2018-05-10 DIAGNOSIS — R2681 Unsteadiness on feet: Secondary | ICD-10-CM | POA: Diagnosis not present

## 2018-05-13 DIAGNOSIS — M6281 Muscle weakness (generalized): Secondary | ICD-10-CM | POA: Diagnosis not present

## 2018-05-13 DIAGNOSIS — R2681 Unsteadiness on feet: Secondary | ICD-10-CM | POA: Diagnosis not present

## 2018-05-15 DIAGNOSIS — M79675 Pain in left toe(s): Secondary | ICD-10-CM | POA: Diagnosis not present

## 2018-05-15 DIAGNOSIS — B351 Tinea unguium: Secondary | ICD-10-CM | POA: Diagnosis not present

## 2018-05-15 DIAGNOSIS — M79674 Pain in right toe(s): Secondary | ICD-10-CM | POA: Diagnosis not present

## 2018-05-16 DIAGNOSIS — R2681 Unsteadiness on feet: Secondary | ICD-10-CM | POA: Diagnosis not present

## 2018-05-16 DIAGNOSIS — M6281 Muscle weakness (generalized): Secondary | ICD-10-CM | POA: Diagnosis not present

## 2018-05-18 DIAGNOSIS — R2681 Unsteadiness on feet: Secondary | ICD-10-CM | POA: Diagnosis not present

## 2018-05-18 DIAGNOSIS — M6281 Muscle weakness (generalized): Secondary | ICD-10-CM | POA: Diagnosis not present

## 2018-06-01 ENCOUNTER — Encounter (HOSPITAL_COMMUNITY): Payer: Self-pay

## 2018-06-01 ENCOUNTER — Emergency Department (HOSPITAL_COMMUNITY)
Admission: EM | Admit: 2018-06-01 | Discharge: 2018-06-01 | Disposition: A | Discharge status: Skilled Nursing Facility | Payer: Medicare Other | Attending: Emergency Medicine | Admitting: Emergency Medicine

## 2018-06-01 ENCOUNTER — Other Ambulatory Visit: Payer: Self-pay

## 2018-06-01 ENCOUNTER — Emergency Department (HOSPITAL_COMMUNITY): Payer: Medicare Other

## 2018-06-01 DIAGNOSIS — E1122 Type 2 diabetes mellitus with diabetic chronic kidney disease: Secondary | ICD-10-CM | POA: Diagnosis not present

## 2018-06-01 DIAGNOSIS — F0390 Unspecified dementia without behavioral disturbance: Secondary | ICD-10-CM | POA: Insufficient documentation

## 2018-06-01 DIAGNOSIS — Z87891 Personal history of nicotine dependence: Secondary | ICD-10-CM | POA: Diagnosis not present

## 2018-06-01 DIAGNOSIS — N390 Urinary tract infection, site not specified: Secondary | ICD-10-CM | POA: Insufficient documentation

## 2018-06-01 DIAGNOSIS — R404 Transient alteration of awareness: Secondary | ICD-10-CM | POA: Diagnosis not present

## 2018-06-01 DIAGNOSIS — Z79899 Other long term (current) drug therapy: Secondary | ICD-10-CM | POA: Insufficient documentation

## 2018-06-01 DIAGNOSIS — Z7984 Long term (current) use of oral hypoglycemic drugs: Secondary | ICD-10-CM | POA: Insufficient documentation

## 2018-06-01 DIAGNOSIS — R600 Localized edema: Secondary | ICD-10-CM | POA: Diagnosis not present

## 2018-06-01 DIAGNOSIS — Z96641 Presence of right artificial hip joint: Secondary | ICD-10-CM | POA: Diagnosis not present

## 2018-06-01 DIAGNOSIS — R531 Weakness: Secondary | ICD-10-CM | POA: Diagnosis not present

## 2018-06-01 DIAGNOSIS — Z7982 Long term (current) use of aspirin: Secondary | ICD-10-CM | POA: Diagnosis not present

## 2018-06-01 DIAGNOSIS — I10 Essential (primary) hypertension: Secondary | ICD-10-CM | POA: Diagnosis not present

## 2018-06-01 DIAGNOSIS — Z7401 Bed confinement status: Secondary | ICD-10-CM | POA: Diagnosis not present

## 2018-06-01 DIAGNOSIS — R21 Rash and other nonspecific skin eruption: Secondary | ICD-10-CM | POA: Diagnosis not present

## 2018-06-01 DIAGNOSIS — N189 Chronic kidney disease, unspecified: Secondary | ICD-10-CM | POA: Insufficient documentation

## 2018-06-01 DIAGNOSIS — I2510 Atherosclerotic heart disease of native coronary artery without angina pectoris: Secondary | ICD-10-CM | POA: Diagnosis not present

## 2018-06-01 DIAGNOSIS — R0989 Other specified symptoms and signs involving the circulatory and respiratory systems: Secondary | ICD-10-CM | POA: Diagnosis not present

## 2018-06-01 DIAGNOSIS — I129 Hypertensive chronic kidney disease with stage 1 through stage 4 chronic kidney disease, or unspecified chronic kidney disease: Secondary | ICD-10-CM | POA: Insufficient documentation

## 2018-06-01 DIAGNOSIS — R2243 Localized swelling, mass and lump, lower limb, bilateral: Secondary | ICD-10-CM | POA: Insufficient documentation

## 2018-06-01 DIAGNOSIS — M2550 Pain in unspecified joint: Secondary | ICD-10-CM | POA: Diagnosis not present

## 2018-06-01 LAB — CBC WITH DIFFERENTIAL/PLATELET
BASOS ABS: 0.1 10*3/uL (ref 0.0–0.1)
Basophils Relative: 1 %
Eosinophils Absolute: 0.5 10*3/uL (ref 0.0–0.7)
Eosinophils Relative: 5 %
HCT: 39.5 % (ref 39.0–52.0)
HEMOGLOBIN: 12.8 g/dL — AB (ref 13.0–17.0)
Lymphocytes Relative: 23 %
Lymphs Abs: 2.2 10*3/uL (ref 0.7–4.0)
MCH: 31.3 pg (ref 26.0–34.0)
MCHC: 32.4 g/dL (ref 30.0–36.0)
MCV: 96.6 fL (ref 78.0–100.0)
Monocytes Absolute: 0.9 10*3/uL (ref 0.1–1.0)
Monocytes Relative: 9 %
Neutro Abs: 6.1 10*3/uL (ref 1.7–7.7)
Neutrophils Relative %: 62 %
Platelets: 173 10*3/uL (ref 150–400)
RBC: 4.09 MIL/uL — ABNORMAL LOW (ref 4.22–5.81)
RDW: 13.5 % (ref 11.5–15.5)
WBC: 9.8 10*3/uL (ref 4.0–10.5)

## 2018-06-01 LAB — COMPREHENSIVE METABOLIC PANEL
ALT: 15 U/L (ref 0–44)
AST: 14 U/L — AB (ref 15–41)
Albumin: 3 g/dL — ABNORMAL LOW (ref 3.5–5.0)
Alkaline Phosphatase: 124 U/L (ref 38–126)
Anion gap: 7 (ref 5–15)
BUN: 25 mg/dL — AB (ref 8–23)
CO2: 27 mmol/L (ref 22–32)
Calcium: 7.9 mg/dL — ABNORMAL LOW (ref 8.9–10.3)
Chloride: 112 mmol/L — ABNORMAL HIGH (ref 98–111)
Creatinine, Ser: 1.01 mg/dL (ref 0.61–1.24)
GFR calc Af Amer: >60 mL/min (ref >60)
GFR calc non Af Amer: >60 mL/min (ref >60)
Glucose, Bld: 150 mg/dL — ABNORMAL HIGH (ref 70–99)
Potassium: 3.3 mmol/L — ABNORMAL LOW (ref 3.5–5.1)
Sodium: 146 mmol/L — ABNORMAL HIGH (ref 135–145)
TOTAL PROTEIN: 5.5 g/dL — AB (ref 6.5–8.1)
Total Bilirubin: 0.5 mg/dL (ref 0.3–1.2)

## 2018-06-01 LAB — URINALYSIS, ROUTINE W REFLEX MICROSCOPIC
BILIRUBIN URINE: NEGATIVE
GLUCOSE, UA: NEGATIVE mg/dL
KETONES UR: NEGATIVE mg/dL
Nitrite: NEGATIVE
PROTEIN: NEGATIVE mg/dL
Specific Gravity, Urine: 1.014 (ref 1.005–1.030)
pH: 8 (ref 5.0–8.0)

## 2018-06-01 LAB — CBG MONITORING, ED: Glucose-Capillary: 157 mg/dL — ABNORMAL HIGH (ref 70–99)

## 2018-06-01 LAB — PHENYTOIN LEVEL, TOTAL: Phenytoin Lvl: 11.2 ug/mL (ref 10.0–20.0)

## 2018-06-01 MED ORDER — CEPHALEXIN 500 MG PO CAPS
500.0000 mg | ORAL_CAPSULE | Freq: Once | ORAL | Status: AC
  Administered 2018-06-01: 500 mg via ORAL
  Filled 2018-06-01: qty 1

## 2018-06-01 MED ORDER — POTASSIUM CHLORIDE CRYS ER 20 MEQ PO TBCR
40.0000 meq | EXTENDED_RELEASE_TABLET | Freq: Once | ORAL | Status: AC
  Administered 2018-06-01: 40 meq via ORAL
  Filled 2018-06-01: qty 2

## 2018-06-01 MED ORDER — CEPHALEXIN 500 MG PO CAPS: 500.0000 mg | ORAL_CAPSULE | Freq: Two times a day (BID) | ORAL | 0 refills | Status: AC

## 2018-06-01 NOTE — ED Provider Notes (Signed)
[facility]-EMERGENCY DEPT Provider Note   CSN: [id number] Arrival date & time: 06/01/18  1547     History   Chief Complaint No chief complaint on file.   HPI Arthur Berry is a 72 y.o. male.  HPI  72 year old male with a known history of underlying memory loss possibly from dementia, currently in a memory care unit facility.  According to the staff since last night around 11:00 he has not been his normal self, they cannot be more clear about the actual distinctions of what has not been normal however per the paramedics they reported that he is usually oriented to person place and events and today is just oriented to person and place.  There was no reports of fevers vomiting coughing rashes blood in the stools or any other complaints.  The patient is very pleasant but is unable to give me much in the way of history.  He is in fact able to tell me his name, where he is but does not know why he is here.  When I asked him why he is here he states "it feels like I am in a mud slide" and " I used to work in [employer] so I know what it feels like"    Past Medical History:  Diagnosis Date  . Adjustment reaction with aggression 05/14/2017  . Arthritis   . Chronic kidney disease   . Colon polyp 2005   Tubular Adenoma  . Coronary artery disease, non-occlusive    Minimal nonobstructive CAD, nl LV systolic fxn by cath 12/11/12  . Diabetes mellitus 2001   TYPE 2  . Diverticulosis   . Epilepsy (HCC)   . GERD (gastroesophageal reflux disease) 04/23/2014  . Goiter, nontoxic, multinodular    Thyroid US 11/2012  . Hyperlipidemia 1999  . Hypertension 1999  . Internal hemorrhoids   . Neuromuscular disorder (HCC)   . OSA (obstructive sleep apnea)   . Prostate enlargement   . Sleep apnea   . Splinter 04/27/13   metal plinter removed rt pointer finger  . Stroke (HCC) 06/2015  . Vision changes 06/2015   since CVA    Patient Active Problem List   Diagnosis Date Noted  .  Status post total replacement of right hip 11/12/2017  . Closed right hip fracture, initial encounter (HCC) 10/23/2017  . Closed subcapital fracture of right femur (HCC)   . Adjustment reaction with aggression 05/14/2017  . Type 2 diabetes mellitus with other circulatory complications (HCC) 06/19/2015  . Thrombocytopenia (HCC) 06/19/2015  . Cerebral infarction due to unspecified mechanism   . Dilantin toxicity 06/18/2015  . Phenytoin toxicity   . Stroke (HCC) 06/16/2015  . Fall 06/16/2015  . Epilepsy (HCC) 06/16/2015  . BPH (benign prostatic hyperplasia) 06/16/2015  . Leukocytosis 06/16/2015  . CVA (cerebral infarction) 06/16/2015  . CVA (cerebral vascular accident) (HCC) 06/15/2015  . GERD (gastroesophageal reflux disease) 04/23/2014  . Potassium (K) deficiency 12/11/2012  . Precordial pain 12/11/2012  . Dehydration 04/15/2012  . AKI (acute kidney injury) (HCC) 04/14/2012  . HLD (hyperlipidemia) 06/01/2010  . Essential hypertension, benign 06/01/2010  . DYSPNEA 06/01/2010  . Diabetes mellitus without complication (HCC) 11/04/2009  . RECTAL BLEEDING 11/04/2009  . PERSONAL HX COLONIC POLYPS 11/04/2009    Past Surgical History:  Procedure Laterality Date  . CHOLECYSTECTOMY    . COLONOSCOPY  12/28/2009  . ESOPHAGOGASTRODUODENOSCOPY  10/14/2012   normal   . INGUINAL HERNIA REPAIR  06/27/11   left  .  KNEE SURGERY  2008   Left  . LEFT HEART CATHETERIZATION WITH CORONARY ANGIOGRAM N/A 12/11/2012   Procedure: LEFT HEART CATHETERIZATION WITH CORONARY ANGIOGRAM;  Surgeon: Tonny BollmanMichael Cooper, MD;  Location[facility] C[facility];  Service: Cardiovascular;  Laterality: N/A;  . TOTAL HIP ARTHROPLASTY Right 10/23/2017   Procedure: TOTAL HIP ARTHROPLASTY ANTERIOR APPROACH;  Surgeon: Kathryne HitchBlackman, Christopher Y, MD;  Location: [facility] OR;  Service: Orthopedics;  Laterality: Right;        Home Medications    Prior to Admission medications   Medication Sig Start Date End Date Taking? Authorizing Provider    amLODipine (NORVASC) 2.5 MG tablet Take 2.5 mg by mouth daily.   Yes [provider]  aspirin 325 MG tablet Take 325 mg by mouth daily.   Yes [provider]  carvedilol (COREG) 3.125 MG tablet Take 3.125 mg by mouth 2 (two) times daily with a meal.   Yes [provider]  cholecalciferol (VITAMIN D) 1000 units tablet Take 1,000 Units by mouth daily.   Yes [provider]  escitalopram (LEXAPRO) 10 MG tablet Take 10 mg by mouth daily.   Yes [provider]  galantamine (RAZADYNE ER) 16 MG 24 hr capsule Take 16 mg by mouth daily with breakfast.   Yes [provider]  levETIRAcetam (KEPPRA) 500 MG tablet Take 500 mg by mouth 2 (two) times daily.     Yes [provider]  lisinopril (PRINIVIL,ZESTRIL) 20 MG tablet Take 20 mg by mouth daily.   Yes [provider]  LORazepam (ATIVAN) 0.5 MG tablet Take 1 tablet (0.5 mg total) by mouth at bedtime. Patient taking differently: Take 0.25 mg by mouth at bedtime.  10/27/17  Yes Hongalgi, Maximino GreenlandAnand D, MD  magnesium oxide (MAG-OX) 400 MG tablet Take 400 mg by mouth daily.   Yes [provider]  Melatonin 3 MG TABS Take 3 mg by mouth at bedtime.   Yes [provider]  metFORMIN (GLUCOPHAGE) 500 MG tablet Take 500 mg by mouth 2 (two) times daily.    Yes [provider]  mirtazapine (REMERON) 30 MG tablet Take 30 mg by mouth at bedtime.    Yes [provider]  pantoprazole (PROTONIX) 40 MG tablet Take 1 tablet (40 mg total) by mouth daily. 06/22/15  Yes Tat, Onalee Huaavid, MD  phenytoin (DILANTIN) 100 MG ER capsule Take 200 mg by mouth daily.   Yes [provider]  potassium chloride SA (K-DUR,KLOR-CON) 20 MEQ tablet Take 20 mEq by mouth daily.    Yes [provider]  risperiDONE (RISPERDAL) 1 MG tablet Take 1 mg by mouth at bedtime.   Yes [provider]  traMADol (ULTRAM) 50 MG tablet Take 1 tablet (50 mg total) by mouth every 6 (six) hours as  needed. Patient taking differently: Take 50 mg by mouth every 6 (six) hours as needed for moderate pain.  11/21/17  Yes Delo, Riley Lamouglas, MD  cephALEXin (KEFLEX) 500 MG capsule Take 1 capsule (500 mg total) by mouth 2 (two) times daily for 7 days. 06/01/18 06/08/18  Eber HongMiller, Kimbrely Buckel, MD  magnesium hydroxide (MILK OF MAGNESIA) 400 MG/5ML suspension Take 30 mLs by mouth at bedtime as needed for mild constipation.     [provider]  neomycin-bacitracin-polymyxin (NEOSPORIN) ointment Apply 1 application topically daily as needed for wound care.    [provider]  phenytoin (DILANTIN) 50 MG tablet Chew 3 tablets (150 mg total) by mouth daily. Patient not taking: Reported on 06/01/2018 10/28/17   Hongalgi,  Maximino Greenland, MD    Family History Family History  Problem Relation Age of Onset  . Heart disease Mother   . Alzheimer's disease Father   . Breast cancer Sister   . Colon cancer Unknown        unknown  . Diabetes Sister     Social History Social History   Tobacco Use  . Smoking status: Former Smoker    Types: Cigars    Last attempt to quit: 04/14/1986    Years since quitting: 32.1  . Smokeless tobacco: Never Used  . Tobacco comment: Quit cigars in the 1980s  Substance Use Topics  . Alcohol use: No  . Drug use: No     Allergies   Patient has no known allergies.   Review of Systems Review of Systems  Unable to perform ROS: Dementia     Physical Exam Updated Vital Signs BP (!) 167/78 (BP Location: Right Arm)   Pulse 69   Temp 98.6 F (37 C) (Oral)   Resp 16   SpO2 98%   Physical Exam  Constitutional: He appears well-developed and well-nourished. No distress.  HENT:  Head: Normocephalic and atraumatic.  Mouth/Throat: Oropharynx is clear and moist. No oropharyngeal exudate.  Eyes: Pupils are equal, round, and reactive to light. Conjunctivae and EOM are normal. Right eye exhibits no discharge. Left eye exhibits no discharge. No scleral icterus.  Neck: Normal  range of motion. Neck supple. No JVD present. No thyromegaly present.  Cardiovascular: Normal rate, regular rhythm, normal heart sounds and intact distal pulses. Exam reveals no gallop and no friction rub.  No murmur heard. Pulmonary/Chest: Effort normal and breath sounds normal. No respiratory distress. He has no wheezes. He has no rales.  Abdominal: Soft. Bowel sounds are normal. He exhibits no distension and no mass. There is no tenderness.  Nontender, no masses, normal sounds to percussion without tympanitic or dullness.  There is no fluid wave.  Musculoskeletal: Normal range of motion. He exhibits edema ( Bilateral lower extremity pitting edema which is symmetrical from the knees through the feet). He exhibits no tenderness.  Lymphadenopathy:    He has no cervical adenopathy.  Neurological: He is alert. Coordination normal.  The patient is awake alert and able to follow all of my commands symmetrically without facial droop.  He is able to grip normally with normal strength, straight leg raise normally, finger-nose-finger is normal, speech is clear, sensation to light touch is intact in the bilateral face arms and legs.  Skin: Skin is warm and dry. No rash noted. No erythema.  Slight excoriated rash to the right lower extremity, no open wounds, no cellulitis  Psychiatric: He has a normal mood and affect. His behavior is normal.  Calm and cooperative  Nursing note and vitals reviewed.    ED Treatments / Results  Labs (all labs ordered are listed, but only abnormal results are displayed) Labs Reviewed  COMPREHENSIVE METABOLIC PANEL - Abnormal; Notable for the following components:      Result Value   Sodium 146 (*)    Potassium 3.3 (*)    Chloride 112 (*)    Glucose, Bld 150 (*)    BUN 25 (*)    Calcium 7.9 (*)    Total Protein 5.5 (*)    Albumin 3.0 (*)    AST 14 (*)    All other components within normal limits  CBC WITH DIFFERENTIAL/PLATELET - Abnormal; Notable for the  following components:   RBC 4.09 (*)  Hemoglobin 12.8 (*)    All other components within normal limits  URINALYSIS, ROUTINE W REFLEX MICROSCOPIC - Abnormal; Notable for the following components:   APPearance CLOUDY (*)    Hgb urine dipstick SMALL (*)    Leukocytes, UA LARGE (*)    WBC, UA >50 (*)    Bacteria, UA FEW (*)    All other components within normal limits  CBG MONITORING, ED - Abnormal; Notable for the following components:   Glucose-Capillary 157 (*)    All other components within normal limits  URINE CULTURE  PHENYTOIN LEVEL, TOTAL  BRAIN NATRIURETIC PEPTIDE    EKG EKG Interpretation  Date/Time:  Saturday June 01 2018 16:26:22 EDT Ventricular Rate:  70 PR Interval:    QRS Duration: 86 QT Interval:  417 QTC Calculation: 450 R Axis:   54 Text Interpretation:  Age not entered, assumed to be  72 years old for purpose of ECG interpretation Sinus rhythm Low voltage, precordial leads Minimal ST depression, diffuse leads since last tracing no significant change Confirmed by Eber Hong ([id number]) on 06/01/2018 4:35:42 PM   Radiology Dg Chest Port 1 View  Result Date: 06/01/2018 CLINICAL DATA:  Patient coming from memory care. Staff state patient been altered mental status since yesterday. Hx HTN and diabetes EXAM: PORTABLE CHEST 1 VIEW COMPARISON:  Radiograph 04/23/2018, neck CT 01/28/202019 FINDINGS: Normal mediastinum and cardiac silhouette. Large thyroid goiter deflects the trachea rightward. Normal pulmonary vasculature. No evidence of effusion, infiltrate, or pneumothorax. No acute bony abnormality. IMPRESSION: No acute cardiopulmonary process. Electronically Signed   By: Genevive Bi M.D.   On: 06/01/2018 16:22    Procedures Procedures (including critical care time)  Medications Ordered in ED Medications  cephALEXin (KEFLEX) capsule 500 mg (has no administration in time range)  potassium chloride SA (K-DUR,KLOR-CON) CR tablet 40 mEq (has no administration in  time range)     Initial Impression / Assessment and Plan / ED Course  I have reviewed the triage vital signs and the nursing notes.  Pertinent labs & imaging results that were available during my care of the patient were reviewed by me and considered in my medical decision making (see chart for details).  Clinical Course as of Jun 01 1920  Sat Jun 01, 2018  1919 Labs show a slight elevation in sodium to 146, potassium of 3.3, preserved renal function, low protein consistent with a malnourished state.  Urinalysis is cloudy, large leukocytes, greater than 50 white blood cells and bacteria present consistent with infection.  Will send for culture and start antibiotics, patient otherwise stable for discharge.  Family members here and agree that patient is at baseline mental status.   [BM]    Clinical Course User Index [BM] Eber Hong, MD    The patient's exam is unremarkable, he is well-appearing, he does have some edema and it is not clear whether this is new or not.  Labs pending to check liver function renal function electrolytes.  He is not short of breath and has no rales on exam or JVD.  Review of the medical record shows that he has been seen 3 times in the last 6 months for falls, each of these were emergency department visits and not admissions.  He has had multiple visits in his doctor's office especially the orthopedic office status post total replacement of his right hip.  Report shows that he did have a stroke in August 2016, had a toxic Dilantin level which was then adjusted.  He had  a heart catheterization in 2014 which showed nonobstructive coronary disease and he is a known diabetic.  UTI presaent -  Dilantin level normal  Final Clinical Impressions(s) / ED Diagnoses   Final diagnoses:  Urinary tract infection without hematuria, site unspecified    ED Discharge Orders        Ordered    cephALEXin (KEFLEX) 500 MG capsule  2 times daily     06/01/18 Chalmers Cater, MD 06/01/18 1921

## 2018-06-01 NOTE — ED Triage Notes (Signed)
Patient coming from memory care. Staff state patient been altered mental status  since yesterday. Patient is alert and oriented to the baseline

## 2018-06-01 NOTE — Discharge Instructions (Signed)
Testing shows that you have slight low potassium, you also have a urinary tract infection.   Please start taking cephalexin 500 mg twice a day for 7 days   Emergency department for severe or worsening symptoms.

## 2018-06-04 LAB — URINE CULTURE: Culture: >=100000 — AB

## 2018-06-05 ENCOUNTER — Telehealth (HOSPITAL_BASED_OUTPATIENT_CLINIC_OR_DEPARTMENT_OTHER): Payer: Self-pay | Admitting: Emergency Medicine

## 2018-06-05 NOTE — Progress Notes (Signed)
ED Antimicrobial Stewardship Positive Culture Follow Up   Arthur Berry is an 72 y.o. male who presented to [facility] on 06/01/2018 with a chief complaint of No chief complaint on file.   Recent Results (from the past 720 hour(s))  Urine Culture     Status: Abnormal   Collection Time: 06/01/18  6:23 PM  Result Value Ref Range Status   Specimen Description   Final    URINE, RANDOM Performed at [facility], [address] [place], [place] [address]    Special Requests   Final    NONE Performed at [facility], [address] [place], [place] [address]    Culture (A)  Final    >=100,000 COLONIES/mL AEROCOCCUS VIRIDANS Standardized susceptibility testing for this organism is not available. Performed at [facility], [address] [place], [place] [address]    Report Status 06/04/2018 FINAL  Final    [x]  Treated with cephalexin, organism resistant to prescribed antimicrobial []  Patient discharged originally without antimicrobial agent and treatment is now indicated  New antibiotic prescription: DC cephalexin, start amoxicillin 500mg  PO BID x 5 days  ED Provider: Army MeliaLaura Murphy, PA   Akansha Wyche, Drake LeachRachel Lynn 06/05/2018, 9:45 AM Clinical Pharmacist Monday - Friday phone -  [phone] Saturday - Sunday phone - [phone]

## 2018-06-05 NOTE — Telephone Encounter (Signed)
Post ED Visit - Positive Culture Follow-up: Successful Patient Follow-Up  Culture assessed and recommendations reviewed by:  []  Enzo BiNathan Batchelder, Pharm.D. []  Celedonio MiyamotoJeremy Frens, [address] BCPS AQ-ID []  Garvin FilaMike Maccia, Pharm.D., BCPS []  Georgina PillionElizabeth Martin, Pharm.D., BCPS []  LucasMinh Pham, [address]D., BCPS, AAHIVP []  Estella HuskMichelle Turner, Pharm.D., BCPS, AAHIVP [x]  Lysle Pearlachel Rumbarger, PharmD, BCPS []  Phillips Climeshuy Dang, PharmD, BCPS []  Agapito GamesAlison Masters, PharmD, BCPS []  Verlan FriendsErin Deja, PharmD  Positive urine culture  []  Patient discharged without antimicrobial prescription and treatment is now indicated [x]  Organism is resistant to prescribed ED discharge antimicrobial []  Patient with positive blood cultures  Changes discussed with ED provider: Army MeliaLaura Murphy PA New antibiotic prescription stop cephalexin, start amoxicillin 500mg  po bid x 5 days [facility] attn Monique @ [phone]   Berle MullMiller, Riannon Mukherjee 06/05/2018, 3:02 PM

## 2018-06-23 ENCOUNTER — Emergency Department (HOSPITAL_COMMUNITY): Payer: Medicare Other

## 2018-06-23 ENCOUNTER — Emergency Department (HOSPITAL_COMMUNITY)
Admission: EM | Admit: 2018-06-23 | Discharge: 2018-06-24 | Disposition: A | Discharge status: Home / Self Care | Payer: Medicare Other | Attending: Emergency Medicine | Admitting: Emergency Medicine

## 2018-06-23 ENCOUNTER — Encounter (HOSPITAL_COMMUNITY): Payer: Self-pay

## 2018-06-23 ENCOUNTER — Other Ambulatory Visit: Payer: Self-pay

## 2018-06-23 DIAGNOSIS — S0990XA Unspecified injury of head, initial encounter: Secondary | ICD-10-CM | POA: Diagnosis not present

## 2018-06-23 DIAGNOSIS — Z96641 Presence of right artificial hip joint: Secondary | ICD-10-CM | POA: Insufficient documentation

## 2018-06-23 DIAGNOSIS — W19XXXA Unspecified fall, initial encounter: Secondary | ICD-10-CM | POA: Diagnosis not present

## 2018-06-23 DIAGNOSIS — Z8673 Personal history of transient ischemic attack (TIA), and cerebral infarction without residual deficits: Secondary | ICD-10-CM | POA: Diagnosis not present

## 2018-06-23 DIAGNOSIS — M6281 Muscle weakness (generalized): Secondary | ICD-10-CM | POA: Diagnosis not present

## 2018-06-23 DIAGNOSIS — I2510 Atherosclerotic heart disease of native coronary artery without angina pectoris: Secondary | ICD-10-CM | POA: Insufficient documentation

## 2018-06-23 DIAGNOSIS — M2550 Pain in unspecified joint: Secondary | ICD-10-CM | POA: Diagnosis not present

## 2018-06-23 DIAGNOSIS — Z7401 Bed confinement status: Secondary | ICD-10-CM | POA: Diagnosis not present

## 2018-06-23 DIAGNOSIS — R531 Weakness: Secondary | ICD-10-CM | POA: Diagnosis not present

## 2018-06-23 DIAGNOSIS — M542 Cervicalgia: Secondary | ICD-10-CM | POA: Diagnosis not present

## 2018-06-23 DIAGNOSIS — I129 Hypertensive chronic kidney disease with stage 1 through stage 4 chronic kidney disease, or unspecified chronic kidney disease: Secondary | ICD-10-CM | POA: Insufficient documentation

## 2018-06-23 DIAGNOSIS — E1122 Type 2 diabetes mellitus with diabetic chronic kidney disease: Secondary | ICD-10-CM | POA: Diagnosis not present

## 2018-06-23 DIAGNOSIS — M25551 Pain in right hip: Secondary | ICD-10-CM | POA: Insufficient documentation

## 2018-06-23 DIAGNOSIS — Y999 Unspecified external cause status: Secondary | ICD-10-CM | POA: Diagnosis not present

## 2018-06-23 DIAGNOSIS — M79604 Pain in right leg: Secondary | ICD-10-CM | POA: Diagnosis not present

## 2018-06-23 DIAGNOSIS — R404 Transient alteration of awareness: Secondary | ICD-10-CM | POA: Diagnosis not present

## 2018-06-23 DIAGNOSIS — Y939 Activity, unspecified: Secondary | ICD-10-CM | POA: Insufficient documentation

## 2018-06-23 DIAGNOSIS — Y929 Unspecified place or not applicable: Secondary | ICD-10-CM | POA: Diagnosis not present

## 2018-06-23 DIAGNOSIS — Z87891 Personal history of nicotine dependence: Secondary | ICD-10-CM | POA: Insufficient documentation

## 2018-06-23 DIAGNOSIS — S199XXA Unspecified injury of neck, initial encounter: Secondary | ICD-10-CM | POA: Diagnosis not present

## 2018-06-23 DIAGNOSIS — S79921A Unspecified injury of right thigh, initial encounter: Secondary | ICD-10-CM | POA: Diagnosis not present

## 2018-06-23 DIAGNOSIS — N189 Chronic kidney disease, unspecified: Secondary | ICD-10-CM | POA: Insufficient documentation

## 2018-06-23 DIAGNOSIS — R52 Pain, unspecified: Secondary | ICD-10-CM | POA: Diagnosis not present

## 2018-06-23 MED ORDER — METHOCARBAMOL 500 MG PO TABS
500.0000 mg | ORAL_TABLET | Freq: Once | ORAL | Status: AC
  Administered 2018-06-23: 500 mg via ORAL
  Filled 2018-06-23: qty 1

## 2018-06-23 MED ORDER — TRAMADOL HCL 50 MG PO TABS: 50.0000 mg | ORAL_TABLET | Freq: Four times a day (QID) | ORAL | 0 refills | Status: DC | PRN

## 2018-06-23 MED ORDER — OXYCODONE-ACETAMINOPHEN 5-325 MG PO TABS
2.0000 | ORAL_TABLET | Freq: Once | ORAL | Status: AC
  Administered 2018-06-23: 2 via ORAL
  Filled 2018-06-23: qty 2

## 2018-06-23 NOTE — ED Triage Notes (Signed)
Patient BIB EMS after [facility]. Pt had unwitnessed fall in hallway. Patient states he did not hit his head, but is having right shoulder, hip and knee pain. Patient has hx of dementia, pt not on any blood thinners.

## 2018-06-23 NOTE — ED Provider Notes (Signed)
Emergency Department Provider Note   I have reviewed the triage vital signs and the nursing notes.   HISTORY  Chief Complaint No chief complaint on file.   HPI Arthur Berry is a 72 y.o. male who stated he fell earlier today and is having some sharp pain from his right hip going down. States he had a pain and then fell and continued to have pain afterwards Looks like this is a common complaint for the patient on previous histories.  He can lift his leg off the bed but states that it hurts.  No syncope.  Says he did not hit his head but does have little bit of neck pain.  Generalized weakness. No other associated or modifying symptoms.    Past Medical History:  Diagnosis Date  . Adjustment reaction with aggression 05/14/2017  . Arthritis   . Chronic kidney disease   . Colon polyp 2005   Tubular Adenoma  . Coronary artery disease, non-occlusive    Minimal nonobstructive CAD, nl LV systolic fxn by cath 12/11/12  . Diabetes mellitus 2001   TYPE 2  . Diverticulosis   . Epilepsy (HCC)   . GERD (gastroesophageal reflux disease) 04/23/2014  . Goiter, nontoxic, multinodular    Thyroid US 11/2012  . Hyperlipidemia 1999  . Hypertension 1999  . Internal hemorrhoids   . Neuromuscular disorder (HCC)   . OSA (obstructive sleep apnea)   . Prostate enlargement   . Sleep apnea   . Splinter 04/27/13   metal plinter removed rt pointer finger  . Stroke (HCC) 06/2015  . Vision changes 06/2015   since CVA    Patient Active Problem List   Diagnosis Date Noted  . Status post total replacement of right hip 11/12/2017  . Closed right hip fracture, initial encounter (HCC) 10/23/2017  . Closed subcapital fracture of right femur (HCC)   . Adjustment reaction with aggression 05/14/2017  . Type 2 diabetes mellitus with other circulatory complications (HCC) 06/19/2015  . Thrombocytopenia (HCC) 06/19/2015  . Cerebral infarction due to unspecified mechanism   . Dilantin toxicity 06/18/2015  .  Phenytoin toxicity   . Stroke (HCC) 06/16/2015  . Fall 06/16/2015  . Epilepsy (HCC) 06/16/2015  . BPH (benign prostatic hyperplasia) 06/16/2015  . Leukocytosis 06/16/2015  . CVA (cerebral infarction) 06/16/2015  . CVA (cerebral vascular accident) (HCC) 06/15/2015  . GERD (gastroesophageal reflux disease) 04/23/2014  . Potassium (K) deficiency 12/11/2012  . Precordial pain 12/11/2012  . Dehydration 04/15/2012  . AKI (acute kidney injury) (HCC) 04/14/2012  . HLD (hyperlipidemia) 06/01/2010  . Essential hypertension, benign 06/01/2010  . DYSPNEA 06/01/2010  . Diabetes mellitus without complication (HCC) 11/04/2009  . RECTAL BLEEDING 11/04/2009  . PERSONAL HX COLONIC POLYPS 11/04/2009    Past Surgical History:  Procedure Laterality Date  . CHOLECYSTECTOMY    . COLONOSCOPY  12/28/2009  . ESOPHAGOGASTRODUODENOSCOPY  10/14/2012   normal   . INGUINAL HERNIA REPAIR  06/27/11   left  . KNEE SURGERY  2008   Left  . LEFT HEART CATHETERIZATION WITH CORONARY ANGIOGRAM N/A 12/11/2012   Procedure: LEFT HEART CATHETERIZATION WITH CORONARY ANGIOGRAM;  Surgeon: Tonny Bollman, MD;  Location: [facility] CATH LAB;  Service: Cardiovascular;  Laterality: N/A;  . TOTAL HIP ARTHROPLASTY Right 10/23/2017   Procedure: TOTAL HIP ARTHROPLASTY ANTERIOR APPROACH;  Surgeon: Kathryne Hitch, MD;  Location: [facility] OR;  Service: Orthopedics;  Laterality: Right;    Current Outpatient Rx  . Order #: [id number] Class: Historical Med  . Order #:  [id number] Class: Historical Med  . Order #: [id number] Class: Historical Med  . Order #: [id number] Class: Historical Med  . Order #: [id number] Class: Historical Med  . Order #: [id number] Class: Historical Med  . Order #: [id number] Class: Historical Med  . Order #: [id number] Class: Historical Med  . Order #: [id number] Class: Print  . Order #: [id number] Class: Historical Med  . Order #: [id number] Class: Historical Med  . Order #: [id number] Class: Historical Med  . Order #: [id number] Class:  Historical Med  . Order #: [id number] Class: Historical Med  . Order #: [id number] Class: Historical Med  . Order #: [id number] Class: Print  . Order #: [id number] Class: Historical Med  . Order #: [id number] Class: Print  . Order #: [id number] Class: Historical Med  . Order #: [id number] Class: Historical Med  . Order #: [id number] Class: Print    Allergies Patient has no known allergies.  Family History  Problem Relation Age of Onset  . Heart disease Mother   . Alzheimer's disease Father   . Breast cancer Sister   . Colon cancer Unknown        unknown  . Diabetes Sister     Social History Social History   Tobacco Use  . Smoking status: Former Smoker    Types: Cigars    Last attempt to quit: 04/14/1986    Years since quitting: 32.2  . Smokeless tobacco: Never Used  . Tobacco comment: Quit cigars in the 1980s  Substance Use Topics  . Alcohol use: No  . Drug use: No    Review of Systems  All other systems negative except as documented in the HPI. All pertinent positives and negatives as reviewed in the HPI. ____________________________________________   PHYSICAL EXAM:  VITAL SIGNS: ED Triage Vitals  Enc Vitals Group     BP 06/23/18 2059 (!) 165/94     Pulse Rate 06/23/18 2059 71     Resp 06/23/18 2059 18     Temp 06/23/18 2059 98.3 F (36.8 C)     Temp Source 06/23/18 2059 Oral     SpO2 06/23/18 2059 99 %    Constitutional: Alert and oriented. Well appearing and in no acute distress. Eyes: Conjunctivae are normal. PERRL. EOMI. Head: Atraumatic. Nose: No congestion/rhinnorhea. Mouth/Throat: Mucous membranes are moist.  Oropharynx non-erythematous. Neck: No stridor.  No meningeal signs.   Cardiovascular: Normal rate, regular rhythm. Good peripheral circulation. Grossly normal heart sounds.   Respiratory: Normal respiratory effort.  No retractions. Lungs CTAB. Gastrointestinal: Soft and nontender. No distention.  Musculoskeletal: No lower extremity tenderness nor edema.  No gross deformities of extremities. Lifts both legs off of bed.  Neurologic:  Normal speech and language. No gross focal neurologic deficits are appreciated.  Skin:  Skin is warm, dry and intact. No rash noted.  ____________________________________________   LABS (all labs ordered are listed, but only abnormal results are displayed)  Labs Reviewed  CBC WITH DIFFERENTIAL/PLATELET  COMPREHENSIVE METABOLIC PANEL  URINALYSIS, ROUTINE W REFLEX MICROSCOPIC   ____________________________________________  EKG   EKG Interpretation  Date/Time:  Sunday June 23 2018 22:01:59 EDT Ventricular Rate:  79 PR Interval:    QRS Duration: 90 QT Interval:  407 QTC Calculation: 467 R Axis:   41 Text Interpretation:  Atrial fibrillation Low voltage, precordial leads Anteroseptal infarct, old Minimal ST depression, anterolateral leads Confirmed by Marily MemosMesner, Sasan Wilkie [phone]([id number]) on 06/23/2018 10:20:55 PM       ____________________________________________  RADIOLOGY  Ct Head Wo Contrast  Result Date: 06/23/2018 CLINICAL DATA:  Recent fall with neck pain on palpation EXAM: CT HEAD WITHOUT  CONTRAST CT CERVICAL SPINE WITHOUT CONTRAST TECHNIQUE: Multidetector CT imaging of the head and cervical spine was performed following the standard protocol without intravenous contrast. Multiplanar CT image reconstructions of the cervical spine were also generated. COMPARISON:  Sep 12, 202019 FINDINGS: CT HEAD FINDINGS Brain: There again noted changes consistent with agenesis of the corpus callosum with ventriculomegaly and colpocephaly stable from the prior exam. Large interhemispheric cyst is again identified superiorly. Scattered chronic white matter ischemic changes noted. No findings to suggest acute hemorrhage or acute infarction are noted. Vascular: No hyperdense vessel or unexpected calcification. Skull: Normal. Negative for fracture or focal lesion. Sinuses/Orbits: No acute finding. Other: None. CT CERVICAL SPINE FINDINGS  Alignment: Within normal limits. Skull base and vertebrae: 7 cervical segments are well visualized. Vertebral body height is well maintained. Disc space narrowing is noted from C3-C7 with osteophytic changes and facet hypertrophic changes. No acute fracture or acute facet abnormality is noted. Soft tissues and spinal canal: Diffuse vascular calcifications are noted. Stable partially calcified nodules are noted within the left thyroid. Upper chest: Negative. Other: Calcifications are noted in the posterior soft tissues of the neck stable from the prior exam. IMPRESSION: CT of the head: Chronic changes as described above stable from the prior exam. No acute abnormality is noted. CT of the cervical spine: Chronic degenerative changes without acute abnormality. Stable nodular changes in the left thyroid. Electronically Signed   By: Alcide Clever M.D.   On: 06/23/2018 23:06   Ct Cervical Spine Wo Contrast  Result Date: 06/23/2018 CLINICAL DATA:  Recent fall with neck pain on palpation EXAM: CT HEAD WITHOUT CONTRAST CT CERVICAL SPINE WITHOUT CONTRAST TECHNIQUE: Multidetector CT imaging of the head and cervical spine was performed following the standard protocol without intravenous contrast. Multiplanar CT image reconstructions of the cervical spine were also generated. COMPARISON:  Sep 12, 202019 FINDINGS: CT HEAD FINDINGS Brain: There again noted changes consistent with agenesis of the corpus callosum with ventriculomegaly and colpocephaly stable from the prior exam. Large interhemispheric cyst is again identified superiorly. Scattered chronic white matter ischemic changes noted. No findings to suggest acute hemorrhage or acute infarction are noted. Vascular: No hyperdense vessel or unexpected calcification. Skull: Normal. Negative for fracture or focal lesion. Sinuses/Orbits: No acute finding. Other: None. CT CERVICAL SPINE FINDINGS Alignment: Within normal limits. Skull base and vertebrae: 7 cervical segments are well  visualized. Vertebral body height is well maintained. Disc space narrowing is noted from C3-C7 with osteophytic changes and facet hypertrophic changes. No acute fracture or acute facet abnormality is noted. Soft tissues and spinal canal: Diffuse vascular calcifications are noted. Stable partially calcified nodules are noted within the left thyroid. Upper chest: Negative. Other: Calcifications are noted in the posterior soft tissues of the neck stable from the prior exam. IMPRESSION: CT of the head: Chronic changes as described above stable from the prior exam. No acute abnormality is noted. CT of the cervical spine: Chronic degenerative changes without acute abnormality. Stable nodular changes in the left thyroid. Electronically Signed   By: Alcide Clever M.D.   On: 06/23/2018 23:06   Dg Femur Min 2 Views Right  Result Date: 06/23/2018 CLINICAL DATA:  Un witnessed fall with right leg pain, initial encounter EXAM: RIGHT FEMUR 2 VIEWS COMPARISON:  11/21/2017 FINDINGS: Right hip prosthesis is noted. Some dystrophic calcifications are noted along the hip joint increased from the prior exam. No loosening or dislocation is seen. The more distal femur is within normal limits. Degenerative changes about the knee joint are noted. No  soft tissue abnormality is seen. IMPRESSION: No acute abnormality noted. Electronically Signed   By: Alcide Clever M.D.   On: 06/23/2018 22:41   ____________________________________________  INITIAL IMPRESSION / ASSESSMENT AND PLAN / ED COURSE  Suspect muscular skeletal pain.  No evidence of fracture.  Will likely discharge with pain medication back to facility.  Pertinent labs & imaging results that were available during my care of the patient were reviewed by me and considered in my medical decision making (see chart for details).  ____________________________________________  FINAL CLINICAL IMPRESSION(S) / ED DIAGNOSES  Final diagnoses:  Right hip pain    MEDICATIONS GIVEN  DURING THIS VISIT:  Medications  methocarbamol (ROBAXIN) tablet 500 mg (500 mg Oral Given 06/23/18 2203)  oxyCODONE-acetaminophen (PERCOCET/ROXICET) 5-325 MG per tablet 2 tablet (2 tablets Oral Given 06/23/18 2203)    NEW OUTPATIENT MEDICATIONS STARTED DURING THIS VISIT:  New Prescriptions   No medications on file    Note:  This note was prepared with assistance of Dragon voice recognition software. Occasional wrong-word or sound-a-like substitutions may have occurred due to the inherent limitations of voice recognition software.   Marily Memos, MD 06/23/18 [phone]

## 2018-06-23 NOTE — ED Notes (Signed)
Patient back from imaging. Attempted to draw blood from patient, unsuccessfully.

## 2018-06-23 NOTE — ED Notes (Signed)
Bed: WA21 Expected date:  Expected time:  Means of arrival:  Comments: 73yo M/SNF Fall-hip and shoulder

## 2018-06-23 NOTE — ED Notes (Signed)
Attempted to draw blood from from pt, unsuccessfully

## 2018-06-23 NOTE — ED Notes (Signed)
Patient transported to X-ray. Will draw lab work when patient returns.

## 2018-06-24 DIAGNOSIS — R82998 Other abnormal findings in urine: Secondary | ICD-10-CM | POA: Diagnosis not present

## 2018-06-24 DIAGNOSIS — R569 Unspecified convulsions: Secondary | ICD-10-CM | POA: Diagnosis not present

## 2018-06-25 DIAGNOSIS — E559 Vitamin D deficiency, unspecified: Secondary | ICD-10-CM | POA: Diagnosis not present

## 2018-06-25 DIAGNOSIS — E119 Type 2 diabetes mellitus without complications: Secondary | ICD-10-CM | POA: Diagnosis not present

## 2018-06-25 DIAGNOSIS — E7849 Other hyperlipidemia: Secondary | ICD-10-CM | POA: Diagnosis not present

## 2018-06-25 DIAGNOSIS — E048 Other specified nontoxic goiter: Secondary | ICD-10-CM | POA: Diagnosis not present

## 2018-07-02 DIAGNOSIS — R82998 Other abnormal findings in urine: Secondary | ICD-10-CM | POA: Diagnosis not present

## 2018-07-02 DIAGNOSIS — Z125 Encounter for screening for malignant neoplasm of prostate: Secondary | ICD-10-CM | POA: Diagnosis not present

## 2018-07-02 DIAGNOSIS — Z1389 Encounter for screening for other disorder: Secondary | ICD-10-CM | POA: Diagnosis not present

## 2018-07-02 DIAGNOSIS — R413 Other amnesia: Secondary | ICD-10-CM | POA: Diagnosis not present

## 2018-07-02 DIAGNOSIS — N183 Chronic kidney disease, stage 3 (moderate): Secondary | ICD-10-CM | POA: Diagnosis not present

## 2018-07-02 DIAGNOSIS — I6389 Other cerebral infarction: Secondary | ICD-10-CM | POA: Diagnosis not present

## 2018-07-02 DIAGNOSIS — Z0000 Encounter for general adult medical examination without abnormal findings: Secondary | ICD-10-CM | POA: Diagnosis not present

## 2018-07-02 DIAGNOSIS — Z6827 Body mass index (BMI) 27.0-27.9, adult: Secondary | ICD-10-CM | POA: Diagnosis not present

## 2018-07-02 DIAGNOSIS — M810 Age-related osteoporosis without current pathological fracture: Secondary | ICD-10-CM | POA: Diagnosis not present

## 2018-07-02 DIAGNOSIS — E1129 Type 2 diabetes mellitus with other diabetic kidney complication: Secondary | ICD-10-CM | POA: Diagnosis not present

## 2018-07-02 DIAGNOSIS — R972 Elevated prostate specific antigen [PSA]: Secondary | ICD-10-CM | POA: Diagnosis not present

## 2018-07-02 DIAGNOSIS — E048 Other specified nontoxic goiter: Secondary | ICD-10-CM | POA: Diagnosis not present

## 2018-07-02 DIAGNOSIS — R945 Abnormal results of liver function studies: Secondary | ICD-10-CM | POA: Diagnosis not present

## 2018-07-02 DIAGNOSIS — R569 Unspecified convulsions: Secondary | ICD-10-CM | POA: Diagnosis not present

## 2018-07-21 ENCOUNTER — Other Ambulatory Visit: Payer: Self-pay

## 2018-07-21 ENCOUNTER — Emergency Department (HOSPITAL_COMMUNITY)
Admission: EM | Admit: 2018-07-21 | Discharge: 2018-07-21 | Disposition: A | Discharge status: Home / Self Care | Payer: Medicare Other | Attending: Emergency Medicine | Admitting: Emergency Medicine

## 2018-07-21 DIAGNOSIS — N189 Chronic kidney disease, unspecified: Secondary | ICD-10-CM | POA: Insufficient documentation

## 2018-07-21 DIAGNOSIS — Z79899 Other long term (current) drug therapy: Secondary | ICD-10-CM | POA: Diagnosis not present

## 2018-07-21 DIAGNOSIS — I129 Hypertensive chronic kidney disease with stage 1 through stage 4 chronic kidney disease, or unspecified chronic kidney disease: Secondary | ICD-10-CM | POA: Insufficient documentation

## 2018-07-21 DIAGNOSIS — Z7984 Long term (current) use of oral hypoglycemic drugs: Secondary | ICD-10-CM | POA: Diagnosis not present

## 2018-07-21 DIAGNOSIS — E1165 Type 2 diabetes mellitus with hyperglycemia: Secondary | ICD-10-CM | POA: Diagnosis not present

## 2018-07-21 DIAGNOSIS — Z7401 Bed confinement status: Secondary | ICD-10-CM | POA: Diagnosis not present

## 2018-07-21 DIAGNOSIS — I2510 Atherosclerotic heart disease of native coronary artery without angina pectoris: Secondary | ICD-10-CM | POA: Insufficient documentation

## 2018-07-21 DIAGNOSIS — M2550 Pain in unspecified joint: Secondary | ICD-10-CM | POA: Diagnosis not present

## 2018-07-21 DIAGNOSIS — R4182 Altered mental status, unspecified: Secondary | ICD-10-CM | POA: Diagnosis not present

## 2018-07-21 DIAGNOSIS — Z7982 Long term (current) use of aspirin: Secondary | ICD-10-CM | POA: Diagnosis not present

## 2018-07-21 DIAGNOSIS — F29 Unspecified psychosis not due to a substance or known physiological condition: Secondary | ICD-10-CM | POA: Diagnosis not present

## 2018-07-21 DIAGNOSIS — Z87891 Personal history of nicotine dependence: Secondary | ICD-10-CM | POA: Insufficient documentation

## 2018-07-21 DIAGNOSIS — R739 Hyperglycemia, unspecified: Secondary | ICD-10-CM

## 2018-07-21 DIAGNOSIS — N390 Urinary tract infection, site not specified: Secondary | ICD-10-CM | POA: Diagnosis present

## 2018-07-21 DIAGNOSIS — R410 Disorientation, unspecified: Secondary | ICD-10-CM | POA: Diagnosis not present

## 2018-07-21 LAB — CBC WITH DIFFERENTIAL/PLATELET
BASOS PCT: 1 %
Basophils Absolute: 0 10*3/uL (ref 0.0–0.1)
Eosinophils Absolute: 0.6 10*3/uL (ref 0.0–0.7)
Eosinophils Relative: 8 %
HEMATOCRIT: 39 % (ref 39.0–52.0)
HEMOGLOBIN: 13.1 g/dL (ref 13.0–17.0)
LYMPHS ABS: 1.9 10*3/uL (ref 0.7–4.0)
LYMPHS PCT: 25 %
MCH: 31 pg (ref 26.0–34.0)
MCHC: 33.6 g/dL (ref 30.0–36.0)
MCV: 92.4 fL (ref 78.0–100.0)
MONOS PCT: 6 %
Monocytes Absolute: 0.5 10*3/uL (ref 0.1–1.0)
NEUTROS ABS: 4.6 10*3/uL (ref 1.7–7.7)
NEUTROS PCT: 60 %
Platelets: 153 10*3/uL (ref 150–400)
RBC: 4.22 MIL/uL (ref 4.22–5.81)
RDW: 12.9 % (ref 11.5–15.5)
WBC: 7.6 10*3/uL (ref 4.0–10.5)

## 2018-07-21 LAB — URINALYSIS, ROUTINE W REFLEX MICROSCOPIC
BILIRUBIN URINE: NEGATIVE
Bacteria, UA: NONE SEEN
Glucose, UA: >=500 mg/dL — AB
KETONES UR: NEGATIVE mg/dL
LEUKOCYTES UA: NEGATIVE
NITRITE: NEGATIVE
PROTEIN: NEGATIVE mg/dL
Specific Gravity, Urine: 1.026 (ref 1.005–1.030)
pH: 7 (ref 5.0–8.0)

## 2018-07-21 LAB — BASIC METABOLIC PANEL
Anion gap: 9 (ref 5–15)
BUN: 23 mg/dL (ref 8–23)
CALCIUM: 9.3 mg/dL (ref 8.9–10.3)
CO2: 30 mmol/L (ref 22–32)
Chloride: 99 mmol/L (ref 98–111)
Creatinine, Ser: 1.08 mg/dL (ref 0.61–1.24)
GFR calc Af Amer: >60 mL/min (ref >60)
GFR calc non Af Amer: >60 mL/min (ref >60)
GLUCOSE: 378 mg/dL — AB (ref 70–99)
Potassium: 4.2 mmol/L (ref 3.5–5.1)
SODIUM: 138 mmol/L (ref 135–145)

## 2018-07-21 LAB — CBG MONITORING, ED
Glucose-Capillary: 317 mg/dL — ABNORMAL HIGH (ref 70–99)
Glucose-Capillary: 220 mg/dL — ABNORMAL HIGH (ref 70–99)

## 2018-07-21 MED ORDER — INSULIN ASPART 100 UNIT/ML ~~LOC~~ SOLN
6.0000 [IU] | Freq: Once | SUBCUTANEOUS | Status: AC
  Administered 2018-07-21: 6 [IU] via INTRAVENOUS
  Filled 2018-07-21: qty 1

## 2018-07-21 NOTE — ED Notes (Signed)
Pt attempted to provide urine specimen at this time but was unable to 

## 2018-07-21 NOTE — Discharge Instructions (Addendum)
Please return for any problem. Follow up with your regular provider as instructed.  °

## 2018-07-21 NOTE — ED Notes (Signed)
Patient yelling at random . I entered room. Patient removed IV himself. Blood all over bed and clothes. Patient stated he did this because he felt this was "what was best for him". Tech A Rhoney cleaned patient up and replaced gown/bed linens.

## 2018-07-21 NOTE — ED Notes (Signed)
Patient up for d/c. I called [facility] to give report. Arnetta answered the phone. She asked if he was on the way back now. Advised I had arranged for transport to pick him up. Arnetta did not get report, did not transfer me to anyone to give report. She stated "That's fine. I will be here until 11:30

## 2018-07-21 NOTE — ED Provider Notes (Signed)
[facility]-EMERGENCY DEPT Provider Note   CSN: [id number] Arrival date & time: 07/21/18  1433     History   Chief Complaint No chief complaint on file.   HPI Arthur Berry is a 72 y.o. male.  72 year old male with prior medical history as documented below presents for evaluation of possible UTI.  Patient was reportedly diagnosed with a UTI 2 months ago.  He does not appear that he was given treatment.  The patient's facility sent him to the ED today for evaluation of possible UTI.  Patient is without symptoms or complaint.  He denies current fever, nausea, vomiting, pain, or other acute complaint.  The history is provided by the patient and medical records.  Illness  This is a new problem. The current episode started more than 1 week ago. The problem occurs rarely. The problem has not changed since onset.Pertinent negatives include no chest pain, no abdominal pain, no headaches and no shortness of breath. Nothing aggravates the symptoms. Nothing relieves the symptoms. He has tried nothing for the symptoms.    Past Medical History:  Diagnosis Date  . Adjustment reaction with aggression 05/14/2017  . Arthritis   . Chronic kidney disease   . Colon polyp 2005   Tubular Adenoma  . Coronary artery disease, non-occlusive    Minimal nonobstructive CAD, nl LV systolic fxn by cath 12/11/12  . Diabetes mellitus 2001   TYPE 2  . Diverticulosis   . Epilepsy (HCC)   . GERD (gastroesophageal reflux disease) 04/23/2014  . Goiter, nontoxic, multinodular    Thyroid US 11/2012  . Hyperlipidemia 1999  . Hypertension 1999  . Internal hemorrhoids   . Neuromuscular disorder (HCC)   . OSA (obstructive sleep apnea)   . Prostate enlargement   . Sleep apnea   . Splinter 04/27/13   metal plinter removed rt pointer finger  . Stroke (HCC) 06/2015  . Vision changes 06/2015   since CVA    Patient Active Problem List   Diagnosis Date Noted  . Status post total replacement of right  hip 11/12/2017  . Closed right hip fracture, initial encounter (HCC) 10/23/2017  . Closed subcapital fracture of right femur (HCC)   . Adjustment reaction with aggression 05/14/2017  . Type 2 diabetes mellitus with other circulatory complications (HCC) 06/19/2015  . Thrombocytopenia (HCC) 06/19/2015  . Cerebral infarction due to unspecified mechanism   . Dilantin toxicity 06/18/2015  . Phenytoin toxicity   . Stroke (HCC) 06/16/2015  . Fall 06/16/2015  . Epilepsy (HCC) 06/16/2015  . BPH (benign prostatic hyperplasia) 06/16/2015  . Leukocytosis 06/16/2015  . CVA (cerebral infarction) 06/16/2015  . CVA (cerebral vascular accident) (HCC) 06/15/2015  . GERD (gastroesophageal reflux disease) 04/23/2014  . Potassium (K) deficiency 12/11/2012  . Precordial pain 12/11/2012  . Dehydration 04/15/2012  . AKI (acute kidney injury) (HCC) 04/14/2012  . HLD (hyperlipidemia) 06/01/2010  . Essential hypertension, benign 06/01/2010  . DYSPNEA 06/01/2010  . Diabetes mellitus without complication (HCC) 11/04/2009  . RECTAL BLEEDING 11/04/2009  . PERSONAL HX COLONIC POLYPS 11/04/2009    Past Surgical History:  Procedure Laterality Date  . CHOLECYSTECTOMY    . COLONOSCOPY  12/28/2009  . ESOPHAGOGASTRODUODENOSCOPY  10/14/2012   normal   . INGUINAL HERNIA REPAIR  06/27/11   left  . KNEE SURGERY  2008   Left  . LEFT HEART CATHETERIZATION WITH CORONARY ANGIOGRAM N/A 12/11/2012   Procedure: LEFT HEART CATHETERIZATION WITH CORONARY ANGIOGRAM;  Surgeon: Tonny Bollman, MD;  Location: [facility]  CATH LAB;  Service: Cardiovascular;  Laterality: N/A;  . TOTAL HIP ARTHROPLASTY Right 10/23/2017   Procedure: TOTAL HIP ARTHROPLASTY ANTERIOR APPROACH;  Surgeon: Kathryne Hitch, MD;  Location: [facility] OR;  Service: Orthopedics;  Laterality: Right;        Home Medications    Prior to Admission medications   Medication Sig Start Date End Date Taking? Authorizing Provider  amLODipine (NORVASC) 2.5 MG tablet Take  2.5 mg by mouth daily.    [provider]  aspirin 325 MG tablet Take 325 mg by mouth daily.    [provider]  carvedilol (COREG) 3.125 MG tablet Take 3.125 mg by mouth 2 (two) times daily with a meal.    [provider]  cholecalciferol (VITAMIN D) 1000 units tablet Take 1,000 Units by mouth daily.    [provider]  escitalopram (LEXAPRO) 10 MG tablet Take 10 mg by mouth daily.    [provider]  galantamine (RAZADYNE ER) 16 MG 24 hr capsule Take 16 mg by mouth daily with breakfast.    [provider]  levETIRAcetam (KEPPRA) 500 MG tablet Take 500 mg by mouth 2 (two) times daily.      [provider]  lisinopril (PRINIVIL,ZESTRIL) 20 MG tablet Take 20 mg by mouth daily.    [provider]  LORazepam (ATIVAN) 0.5 MG tablet Take 1 tablet (0.5 mg total) by mouth at bedtime. Patient taking differently: Take 0.25 mg by mouth at bedtime.  10/27/17   Hongalgi, Maximino Greenland, MD  magnesium hydroxide (MILK OF MAGNESIA) 400 MG/5ML suspension Take 30 mLs by mouth at bedtime as needed for mild constipation.     [provider]  magnesium oxide (MAG-OX) 400 MG tablet Take 400 mg by mouth daily.    [provider]  Melatonin 3 MG TABS Take 3 mg by mouth at bedtime.    [provider]  metFORMIN (GLUCOPHAGE) 500 MG tablet Take 500 mg by mouth 2 (two) times daily.     [provider]  mirtazapine (REMERON) 30 MG tablet Take 30 mg by mouth at bedtime.     [provider]  neomycin-bacitracin-polymyxin (NEOSPORIN) ointment Apply 1 application topically daily as needed for wound care.    [provider]  pantoprazole (PROTONIX) 40 MG tablet Take 1 tablet (40 mg total) by mouth daily. 06/22/15   Catarina Hartshorn, MD  phenytoin (DILANTIN) 100 MG ER capsule Take 200 mg by mouth daily.    [provider]  phenytoin (DILANTIN) 50 MG tablet Chew 3 tablets (150 mg total) by mouth daily. Patient not  taking: Reported on 06/01/2018 10/28/17   Elease Etienne, MD  potassium chloride SA (K-DUR,KLOR-CON) 20 MEQ tablet Take 20 mEq by mouth daily.     [provider]  risperiDONE (RISPERDAL) 1 MG tablet Take 1 mg by mouth at bedtime.    [provider]  traMADol (ULTRAM) 50 MG tablet Take 1 tablet (50 mg total) by mouth every 6 (six) hours as needed. 06/23/18   Mesner, Barbara Cower, MD  traMADol (ULTRAM) 50 MG tablet Take 1 tablet (50 mg total) by mouth every 6 (six) hours as needed for moderate pain. 06/23/18   Mesner, Barbara Cower, MD    Family History Family History  Problem Relation Age of Onset  . Heart disease Mother   . Alzheimer's disease Father   . Breast cancer Sister   . Colon cancer Unknown        unknown  . Diabetes Sister  Social History Social History   Tobacco Use  . Smoking status: Former Smoker    Types: Cigars    Last attempt to quit: 04/14/1986    Years since quitting: 32.2  . Smokeless tobacco: Never Used  . Tobacco comment: Quit cigars in the 1980s  Substance Use Topics  . Alcohol use: No  . Drug use: No     Allergies   Patient has no known allergies.   Review of Systems Review of Systems  Respiratory: Negative for shortness of breath.   Cardiovascular: Negative for chest pain.  Gastrointestinal: Negative for abdominal pain.  Neurological: Negative for headaches.  All other systems reviewed and are negative.    Physical Exam Updated Vital Signs BP (!) 166/99   Pulse 72   Temp (!) 97.5 F (36.4 C) (Oral)   Resp 17   SpO2 100%   Physical Exam  Constitutional: He is oriented to person, place, and time. He appears well-developed and well-nourished. No distress.  HENT:  Head: Normocephalic and atraumatic.  Mouth/Throat: Oropharynx is clear and moist.  Eyes: Pupils are equal, round, and reactive to light. Conjunctivae and EOM are normal.  Neck: Normal range of motion. Neck supple.  Cardiovascular: Normal rate, regular rhythm and normal  heart sounds.  Pulmonary/Chest: Effort normal and breath sounds normal. No respiratory distress.  Abdominal: Soft. He exhibits no distension. There is no tenderness.  Musculoskeletal: Normal range of motion. He exhibits no edema or deformity.  Neurological: He is alert and oriented to person, place, and time.  Skin: Skin is warm and dry.  Psychiatric: He has a normal mood and affect.  Nursing note and vitals reviewed.    ED Treatments / Results  Labs (all labs ordered are listed, but only abnormal results are displayed) Labs Reviewed  URINALYSIS, ROUTINE W REFLEX MICROSCOPIC - Abnormal; Notable for the following components:      Result Value   Color, Urine STRAW (*)    Glucose, UA >=500 (*)    Hgb urine dipstick SMALL (*)    All other components within normal limits  BASIC METABOLIC PANEL - Abnormal; Notable for the following components:   Glucose, Bld 378 (*)    All other components within normal limits  CBG MONITORING, ED - Abnormal; Notable for the following components:   Glucose-Capillary 317 (*)    All other components within normal limits  CBG MONITORING, ED - Abnormal; Notable for the following components:   Glucose-Capillary 220 (*)    All other components within normal limits  CBC WITH DIFFERENTIAL/PLATELET    EKG None  Radiology No results found.  Procedures Procedures (including critical care time)  Medications Ordered in ED Medications - No data to display   Initial Impression / Assessment and Plan / ED Course  I have reviewed the triage vital signs and the nursing notes.  Pertinent labs & imaging results that were available during my care of the patient were reviewed by me and considered in my medical decision making (see chart for details).     MDM  Screen complete  Patient is presenting for evaluation of possible UTI.  Patient is asymptomatic.  Screening exam and labs do not suggest significant infection or other pathology.  Patient appears to  be stable for discharge. Glucose is mildly elevated without evidence of DKA or other associated electrolyte disturbance.   Strict return precautions given and understood.  Importance of close follow-up was stressed.  Final Clinical Impressions(s) / ED Diagnoses   Final diagnoses:  Hyperglycemia    ED Discharge Orders    None       Wynetta Fines, MD 07/21/18 1850

## 2018-07-21 NOTE — ED Triage Notes (Signed)
Pt bib EMS and coming from [facility] house.  Pt has had an untreated UTI x 2 months. EMS found that pt had hyperglycemia with a CBG of 475.  Pt a/o per his baseline but pt has dementia. Pt denies any pain to EMS.  Staff reported to EMS that pt is known to be combative but pt has been cooperative and calm with EMS.

## 2018-08-06 DIAGNOSIS — M79675 Pain in left toe(s): Secondary | ICD-10-CM | POA: Diagnosis not present

## 2018-08-06 DIAGNOSIS — B351 Tinea unguium: Secondary | ICD-10-CM | POA: Diagnosis not present

## 2018-08-06 DIAGNOSIS — M79674 Pain in right toe(s): Secondary | ICD-10-CM | POA: Diagnosis not present

## 2018-08-29 DIAGNOSIS — M6281 Muscle weakness (generalized): Secondary | ICD-10-CM | POA: Diagnosis not present

## 2018-08-29 DIAGNOSIS — R2681 Unsteadiness on feet: Secondary | ICD-10-CM | POA: Diagnosis not present

## 2018-08-30 DIAGNOSIS — M6281 Muscle weakness (generalized): Secondary | ICD-10-CM | POA: Diagnosis not present

## 2018-08-30 DIAGNOSIS — R2681 Unsteadiness on feet: Secondary | ICD-10-CM | POA: Diagnosis not present

## 2018-09-02 DIAGNOSIS — M6281 Muscle weakness (generalized): Secondary | ICD-10-CM | POA: Diagnosis not present

## 2018-09-02 DIAGNOSIS — R2681 Unsteadiness on feet: Secondary | ICD-10-CM | POA: Diagnosis not present

## 2018-09-06 DIAGNOSIS — M6281 Muscle weakness (generalized): Secondary | ICD-10-CM | POA: Diagnosis not present

## 2018-09-06 DIAGNOSIS — R2681 Unsteadiness on feet: Secondary | ICD-10-CM | POA: Diagnosis not present

## 2018-09-09 DIAGNOSIS — M6281 Muscle weakness (generalized): Secondary | ICD-10-CM | POA: Diagnosis not present

## 2018-09-09 DIAGNOSIS — R2681 Unsteadiness on feet: Secondary | ICD-10-CM | POA: Diagnosis not present

## 2018-09-11 DIAGNOSIS — M6281 Muscle weakness (generalized): Secondary | ICD-10-CM | POA: Diagnosis not present

## 2018-09-11 DIAGNOSIS — R2681 Unsteadiness on feet: Secondary | ICD-10-CM | POA: Diagnosis not present

## 2018-09-13 DIAGNOSIS — M6281 Muscle weakness (generalized): Secondary | ICD-10-CM | POA: Diagnosis not present

## 2018-09-13 DIAGNOSIS — R2681 Unsteadiness on feet: Secondary | ICD-10-CM | POA: Diagnosis not present

## 2018-09-16 DIAGNOSIS — M6281 Muscle weakness (generalized): Secondary | ICD-10-CM | POA: Diagnosis not present

## 2018-09-16 DIAGNOSIS — R2681 Unsteadiness on feet: Secondary | ICD-10-CM | POA: Diagnosis not present

## 2018-09-17 DIAGNOSIS — M6281 Muscle weakness (generalized): Secondary | ICD-10-CM | POA: Diagnosis not present

## 2018-09-17 DIAGNOSIS — R2681 Unsteadiness on feet: Secondary | ICD-10-CM | POA: Diagnosis not present

## 2018-09-18 DIAGNOSIS — M6281 Muscle weakness (generalized): Secondary | ICD-10-CM | POA: Diagnosis not present

## 2018-09-18 DIAGNOSIS — R2681 Unsteadiness on feet: Secondary | ICD-10-CM | POA: Diagnosis not present

## 2018-09-20 DIAGNOSIS — R2681 Unsteadiness on feet: Secondary | ICD-10-CM | POA: Diagnosis not present

## 2018-09-20 DIAGNOSIS — M6281 Muscle weakness (generalized): Secondary | ICD-10-CM | POA: Diagnosis not present

## 2018-09-23 DIAGNOSIS — R2681 Unsteadiness on feet: Secondary | ICD-10-CM | POA: Diagnosis not present

## 2018-09-23 DIAGNOSIS — M6281 Muscle weakness (generalized): Secondary | ICD-10-CM | POA: Diagnosis not present

## 2018-09-25 DIAGNOSIS — M6281 Muscle weakness (generalized): Secondary | ICD-10-CM | POA: Diagnosis not present

## 2018-09-25 DIAGNOSIS — R2681 Unsteadiness on feet: Secondary | ICD-10-CM | POA: Diagnosis not present

## 2018-09-27 DIAGNOSIS — M6281 Muscle weakness (generalized): Secondary | ICD-10-CM | POA: Diagnosis not present

## 2018-09-27 DIAGNOSIS — R2681 Unsteadiness on feet: Secondary | ICD-10-CM | POA: Diagnosis not present

## 2018-09-30 DIAGNOSIS — M6281 Muscle weakness (generalized): Secondary | ICD-10-CM | POA: Diagnosis not present

## 2018-09-30 DIAGNOSIS — R2681 Unsteadiness on feet: Secondary | ICD-10-CM | POA: Diagnosis not present

## 2018-10-02 DIAGNOSIS — M6281 Muscle weakness (generalized): Secondary | ICD-10-CM | POA: Diagnosis not present

## 2018-10-02 DIAGNOSIS — R2681 Unsteadiness on feet: Secondary | ICD-10-CM | POA: Diagnosis not present

## 2018-10-04 DIAGNOSIS — R2681 Unsteadiness on feet: Secondary | ICD-10-CM | POA: Diagnosis not present

## 2018-10-04 DIAGNOSIS — M6281 Muscle weakness (generalized): Secondary | ICD-10-CM | POA: Diagnosis not present

## 2018-10-06 DIAGNOSIS — M6281 Muscle weakness (generalized): Secondary | ICD-10-CM | POA: Diagnosis not present

## 2018-10-06 DIAGNOSIS — R2681 Unsteadiness on feet: Secondary | ICD-10-CM | POA: Diagnosis not present

## 2018-10-07 DIAGNOSIS — M6281 Muscle weakness (generalized): Secondary | ICD-10-CM | POA: Diagnosis not present

## 2018-10-07 DIAGNOSIS — R2681 Unsteadiness on feet: Secondary | ICD-10-CM | POA: Diagnosis not present

## 2018-10-10 DIAGNOSIS — R2681 Unsteadiness on feet: Secondary | ICD-10-CM | POA: Diagnosis not present

## 2018-10-10 DIAGNOSIS — M6281 Muscle weakness (generalized): Secondary | ICD-10-CM | POA: Diagnosis not present

## 2018-10-14 DIAGNOSIS — M6281 Muscle weakness (generalized): Secondary | ICD-10-CM | POA: Diagnosis not present

## 2018-10-14 DIAGNOSIS — R2681 Unsteadiness on feet: Secondary | ICD-10-CM | POA: Diagnosis not present

## 2018-10-15 DIAGNOSIS — R2681 Unsteadiness on feet: Secondary | ICD-10-CM | POA: Diagnosis not present

## 2018-10-15 DIAGNOSIS — M6281 Muscle weakness (generalized): Secondary | ICD-10-CM | POA: Diagnosis not present

## 2018-10-16 DIAGNOSIS — M6281 Muscle weakness (generalized): Secondary | ICD-10-CM | POA: Diagnosis not present

## 2018-10-16 DIAGNOSIS — R2681 Unsteadiness on feet: Secondary | ICD-10-CM | POA: Diagnosis not present

## 2018-10-17 DIAGNOSIS — M6281 Muscle weakness (generalized): Secondary | ICD-10-CM | POA: Diagnosis not present

## 2018-10-17 DIAGNOSIS — R2681 Unsteadiness on feet: Secondary | ICD-10-CM | POA: Diagnosis not present

## 2018-10-18 DIAGNOSIS — R2681 Unsteadiness on feet: Secondary | ICD-10-CM | POA: Diagnosis not present

## 2018-10-18 DIAGNOSIS — M6281 Muscle weakness (generalized): Secondary | ICD-10-CM | POA: Diagnosis not present

## 2018-10-21 ENCOUNTER — Encounter (INDEPENDENT_AMBULATORY_CARE_PROVIDER_SITE_OTHER): Payer: Self-pay | Admitting: Physician Assistant

## 2018-10-21 ENCOUNTER — Ambulatory Visit (INDEPENDENT_AMBULATORY_CARE_PROVIDER_SITE_OTHER): Payer: Medicare Other | Admitting: Physician Assistant

## 2018-10-21 ENCOUNTER — Ambulatory Visit (INDEPENDENT_AMBULATORY_CARE_PROVIDER_SITE_OTHER): Payer: Medicare Other

## 2018-10-21 DIAGNOSIS — Z96641 Presence of right artificial hip joint: Secondary | ICD-10-CM | POA: Diagnosis not present

## 2018-10-21 DIAGNOSIS — R2681 Unsteadiness on feet: Secondary | ICD-10-CM | POA: Diagnosis not present

## 2018-10-21 DIAGNOSIS — M6281 Muscle weakness (generalized): Secondary | ICD-10-CM | POA: Diagnosis not present

## 2018-10-21 NOTE — Progress Notes (Signed)
Office Visit Note   Patient: Arthur Berry           Date of Birth: 08-07-1946           MRN: [id number] Visit Date: 10/21/2018              Requested by: Rodrigo RanPerini, Mark, MD [address] [place], [place] [id number] PCP: Rodrigo RanPerini, Mark, MD   Assessment & Plan: Visit Diagnoses:  1. Status post total replacement of right hip     Plan: He [facility] range of motion strengthening gait and balance.  He will follow-up with us on as-needed basis.  Questions were encouraged and answered by the patient and his brother is present today.  Follow-Up Instructions: Return if symptoms worsen or fail to improve.   Orders:  Orders Placed This Encounter  Procedures  . XR HIP UNILAT W OR W/O PELVIS 2-3 VIEWS RIGHT   No orders of the defined types were placed in this encounter.     Procedures: No procedures performed   Clinical Data: No additional findings.   Subjective: Chief Complaint  Patient presents with  . Right Hip - Pain, Follow-up    HPI [name] returns today 1 year status post right total hip arthroplasty.  He states overall he is doing well.  His brother states that he is really not getting therapy knee and is mostly getting around a wheelchair can get up for transfers with a walker.  He has no pain in the hip.  Review of Systems See HPI otherwise negative  Objective: Vital Signs: There were no vitals taken for this visit.  Physical Exam  Constitutional: He appears well-developed and well-nourished. No distress.  Pulmonary/Chest: Effort normal.  Neurological: He is alert.    Ortho Exam Right hip stiffness with internal and external rotation without pain.  With circumflexion  good range of motion of the hip.  Calf supple nontender.  Specialty Comments:  No specialty comments available.  Imaging: Xr Hip Unilat W Or W/o Pelvis 2-3 Views Right  Result Date: 10/21/2018 AP pelvis lateral view right hip.  Hip  components well-seated no signs of loosening or hardware failure.  Significant heterotopic calcification about the right hip.  Bilateral hips well located.  No acute fracture    PMFS History: Patient Active Problem List   Diagnosis Date Noted  . Status post total replacement of right hip 11/12/2017  . Closed right hip fracture, initial encounter (HCC) 10/23/2017  . Closed subcapital fracture of right femur (HCC)   . Adjustment reaction with aggression 05/14/2017  . Type 2 diabetes mellitus with other circulatory complications (HCC) 06/19/2015  . Thrombocytopenia (HCC) 06/19/2015  . Cerebral infarction due to unspecified mechanism   . Dilantin toxicity 06/18/2015  . Phenytoin toxicity   . Stroke (HCC) 06/16/2015  . Fall 06/16/2015  . Epilepsy (HCC) 06/16/2015  . BPH (benign prostatic hyperplasia) 06/16/2015  . Leukocytosis 06/16/2015  . CVA (cerebral infarction) 06/16/2015  . CVA (cerebral vascular accident) (HCC) 06/15/2015  . GERD (gastroesophageal reflux disease) 04/23/2014  . Potassium (K) deficiency 12/11/2012  . Precordial pain 12/11/2012  . Dehydration 04/15/2012  . AKI (acute kidney injury) (HCC) 04/14/2012  . HLD (hyperlipidemia) 06/01/2010  . Essential hypertension, benign 06/01/2010  . DYSPNEA 06/01/2010  . Diabetes mellitus without complication (HCC) 11/04/2009  . RECTAL BLEEDING 11/04/2009  . PERSONAL HX COLONIC POLYPS 11/04/2009   Past Medical History:  Diagnosis Date  . Adjustment reaction with  aggression 05/14/2017  . Arthritis   . Chronic kidney disease   . Colon polyp 2005   Tubular Adenoma  . Coronary artery disease, non-occlusive    Minimal nonobstructive CAD, nl LV systolic fxn by cath 12/11/12  . Diabetes mellitus 2001   TYPE 2  . Diverticulosis   . Epilepsy (HCC)   . GERD (gastroesophageal reflux disease) 04/23/2014  . Goiter, nontoxic, multinodular    Thyroid US 11/2012  . Hyperlipidemia 1999  . Hypertension 1999  . Internal hemorrhoids   .  Neuromuscular disorder (HCC)   . OSA (obstructive sleep apnea)   . Prostate enlargement   . Sleep apnea   . Splinter 04/27/13   metal plinter removed rt pointer finger  . Stroke (HCC) 06/2015  . Vision changes 06/2015   since CVA    Family History  Problem Relation Age of Onset  . Heart disease Mother   . Alzheimer's disease Father   . Breast cancer Sister   . Colon cancer Unknown        unknown  . Diabetes Sister     Past Surgical History:  Procedure Laterality Date  . CHOLECYSTECTOMY    . COLONOSCOPY  12/28/2009  . ESOPHAGOGASTRODUODENOSCOPY  10/14/2012   normal   . INGUINAL HERNIA REPAIR  06/27/11   left  . KNEE SURGERY  2008   Left  . LEFT HEART CATHETERIZATION WITH CORONARY ANGIOGRAM N/A 12/11/2012   Procedure: LEFT HEART CATHETERIZATION WITH CORONARY ANGIOGRAM;  Surgeon: Tonny Bollman, MD;  Location: [facility] CATH LAB;  Service: Cardiovascular;  Laterality: N/A;  . TOTAL HIP ARTHROPLASTY Right 10/23/2017   Procedure: TOTAL HIP ARTHROPLASTY ANTERIOR APPROACH;  Surgeon: Kathryne Hitch, MD;  Location: [facility] OR;  Service: Orthopedics;  Laterality: Right;   Social History   Occupational History  . Occupation: Multimedia programmer: [employer]  Tobacco Use  . Smoking status: Former Smoker    Types: Cigars    Last attempt to quit: 04/14/1986    Years since quitting: 32.5  . Smokeless tobacco: Never Used  . Tobacco comment: Quit cigars in the 1980s  Substance and Sexual Activity  . Alcohol use: No  . Drug use: No  . Sexual activity: Never

## 2018-10-22 ENCOUNTER — Encounter (HOSPITAL_COMMUNITY): Payer: Self-pay

## 2018-10-22 ENCOUNTER — Emergency Department (HOSPITAL_COMMUNITY): Payer: Medicare Other

## 2018-10-22 ENCOUNTER — Other Ambulatory Visit: Payer: Self-pay

## 2018-10-22 ENCOUNTER — Emergency Department (HOSPITAL_COMMUNITY)
Admission: EM | Admit: 2018-10-22 | Discharge: 2018-10-22 | Disposition: A | Discharge status: Home / Self Care | Payer: Medicare Other | Attending: Emergency Medicine | Admitting: Emergency Medicine

## 2018-10-22 DIAGNOSIS — R609 Edema, unspecified: Secondary | ICD-10-CM | POA: Diagnosis not present

## 2018-10-22 DIAGNOSIS — S0181XA Laceration without foreign body of other part of head, initial encounter: Secondary | ICD-10-CM | POA: Insufficient documentation

## 2018-10-22 DIAGNOSIS — Y998 Other external cause status: Secondary | ICD-10-CM | POA: Diagnosis not present

## 2018-10-22 DIAGNOSIS — Z7982 Long term (current) use of aspirin: Secondary | ICD-10-CM | POA: Diagnosis not present

## 2018-10-22 DIAGNOSIS — I10 Essential (primary) hypertension: Secondary | ICD-10-CM | POA: Insufficient documentation

## 2018-10-22 DIAGNOSIS — W19XXXA Unspecified fall, initial encounter: Secondary | ICD-10-CM

## 2018-10-22 DIAGNOSIS — M79674 Pain in right toe(s): Secondary | ICD-10-CM | POA: Diagnosis not present

## 2018-10-22 DIAGNOSIS — B351 Tinea unguium: Secondary | ICD-10-CM | POA: Diagnosis not present

## 2018-10-22 DIAGNOSIS — Y939 Activity, unspecified: Secondary | ICD-10-CM | POA: Insufficient documentation

## 2018-10-22 DIAGNOSIS — M2550 Pain in unspecified joint: Secondary | ICD-10-CM | POA: Diagnosis not present

## 2018-10-22 DIAGNOSIS — E119 Type 2 diabetes mellitus without complications: Secondary | ICD-10-CM | POA: Diagnosis not present

## 2018-10-22 DIAGNOSIS — Z87891 Personal history of nicotine dependence: Secondary | ICD-10-CM | POA: Diagnosis not present

## 2018-10-22 DIAGNOSIS — W050XXA Fall from non-moving wheelchair, initial encounter: Secondary | ICD-10-CM | POA: Insufficient documentation

## 2018-10-22 DIAGNOSIS — Y92129 Unspecified place in nursing home as the place of occurrence of the external cause: Secondary | ICD-10-CM | POA: Diagnosis not present

## 2018-10-22 DIAGNOSIS — Z8673 Personal history of transient ischemic attack (TIA), and cerebral infarction without residual deficits: Secondary | ICD-10-CM | POA: Diagnosis not present

## 2018-10-22 DIAGNOSIS — Z79899 Other long term (current) drug therapy: Secondary | ICD-10-CM | POA: Insufficient documentation

## 2018-10-22 DIAGNOSIS — Z7401 Bed confinement status: Secondary | ICD-10-CM | POA: Diagnosis not present

## 2018-10-22 DIAGNOSIS — S0083XA Contusion of other part of head, initial encounter: Secondary | ICD-10-CM

## 2018-10-22 DIAGNOSIS — I2510 Atherosclerotic heart disease of native coronary artery without angina pectoris: Secondary | ICD-10-CM | POA: Insufficient documentation

## 2018-10-22 DIAGNOSIS — S199XXA Unspecified injury of neck, initial encounter: Secondary | ICD-10-CM | POA: Diagnosis not present

## 2018-10-22 DIAGNOSIS — M79675 Pain in left toe(s): Secondary | ICD-10-CM | POA: Diagnosis not present

## 2018-10-22 DIAGNOSIS — Z7984 Long term (current) use of oral hypoglycemic drugs: Secondary | ICD-10-CM | POA: Insufficient documentation

## 2018-10-22 DIAGNOSIS — E785 Hyperlipidemia, unspecified: Secondary | ICD-10-CM | POA: Diagnosis not present

## 2018-10-22 DIAGNOSIS — S0990XA Unspecified injury of head, initial encounter: Secondary | ICD-10-CM | POA: Diagnosis not present

## 2018-10-22 DIAGNOSIS — E1165 Type 2 diabetes mellitus with hyperglycemia: Secondary | ICD-10-CM | POA: Diagnosis not present

## 2018-10-22 DIAGNOSIS — R58 Hemorrhage, not elsewhere classified: Secondary | ICD-10-CM | POA: Diagnosis not present

## 2018-10-22 LAB — CBC WITH DIFFERENTIAL/PLATELET
Abs Immature Granulocytes: 0.04 10*3/uL (ref 0.00–0.07)
BASOS ABS: 0.1 10*3/uL (ref 0.0–0.1)
Basophils Relative: 1 %
EOS ABS: 0.9 10*3/uL — AB (ref 0.0–0.5)
Eosinophils Relative: 9 %
HEMATOCRIT: 38.8 % — AB (ref 39.0–52.0)
Hemoglobin: 12.7 g/dL — ABNORMAL LOW (ref 13.0–17.0)
IMMATURE GRANULOCYTES: 0 %
LYMPHS ABS: 1.5 10*3/uL (ref 0.7–4.0)
LYMPHS PCT: 14 %
MCH: 31.1 pg (ref 26.0–34.0)
MCHC: 32.7 g/dL (ref 30.0–36.0)
MCV: 95.1 fL (ref 80.0–100.0)
Monocytes Absolute: 0.7 10*3/uL (ref 0.1–1.0)
Monocytes Relative: 7 %
NRBC: 0 % (ref 0.0–0.2)
Neutro Abs: 7.1 10*3/uL (ref 1.7–7.7)
Neutrophils Relative %: 69 %
Platelets: 127 10*3/uL — ABNORMAL LOW (ref 150–400)
RBC: 4.08 MIL/uL — ABNORMAL LOW (ref 4.22–5.81)
RDW: 13.1 % (ref 11.5–15.5)
WBC: 10.3 10*3/uL (ref 4.0–10.5)

## 2018-10-22 LAB — COMPREHENSIVE METABOLIC PANEL
ALT: 18 U/L (ref 0–44)
AST: 20 U/L (ref 15–41)
Albumin: 3.7 g/dL (ref 3.5–5.0)
Alkaline Phosphatase: 153 U/L — ABNORMAL HIGH (ref 38–126)
Anion gap: 12 (ref 5–15)
BUN: 25 mg/dL — ABNORMAL HIGH (ref 8–23)
CALCIUM: 9.6 mg/dL (ref 8.9–10.3)
CHLORIDE: 101 mmol/L (ref 98–111)
CO2: 30 mmol/L (ref 22–32)
Creatinine, Ser: 1.06 mg/dL (ref 0.61–1.24)
GFR calc non Af Amer: >60 mL/min (ref >60)
Glucose, Bld: 319 mg/dL — ABNORMAL HIGH (ref 70–99)
Potassium: 4.2 mmol/L (ref 3.5–5.1)
SODIUM: 143 mmol/L (ref 135–145)
Total Bilirubin: 0.6 mg/dL (ref 0.3–1.2)
Total Protein: 6.7 g/dL (ref 6.5–8.1)

## 2018-10-22 MED ORDER — LIDOCAINE-EPINEPHRINE (PF) 2 %-1:200000 IJ SOLN
20.0000 mL | Freq: Once | INTRAMUSCULAR | Status: AC
  Administered 2018-10-22: 20 mL
  Filled 2018-10-22: qty 20

## 2018-10-22 MED ORDER — LIDOCAINE-EPINEPHRINE-TETRACAINE (LET) SOLUTION
3.0000 mL | Freq: Once | NASAL | Status: AC
Start: 2018-10-22 — End: 2018-10-22
  Administered 2018-10-22: 3 mL via TOPICAL
  Filled 2018-10-22: qty 3

## 2018-10-22 MED ORDER — LIDOCAINE-EPINEPHRINE (PF) 2 %-1:200000 IJ SOLN
10.0000 mL | Freq: Once | INTRAMUSCULAR | Status: DC
  Filled 2018-10-22 (×3): qty 10

## 2018-10-22 MED ORDER — LIDOCAINE-EPINEPHRINE 1 %-1:100000 IJ SOLN: 10.0000 mL | Freq: Once | INTRAMUSCULAR | Status: DC

## 2018-10-22 NOTE — ED Provider Notes (Signed)
[facility] EMERGENCY DEPARTMENT Provider Note   CSN: [id number] Arrival date & time: 10/22/18  1413     History   Chief Complaint Chief Complaint  Patient presents with  . Fall  . Loss of Consciousness    HPI Arthur Berry is a 72 y.o. male.  72yo M w/ PMH including epilepsy, OSA, T2DM, CKD, CAD, CVA who p/w fall.  EMS reports that the patient had an unwitnessed fall from his wheelchair at his nursing facility, [facility].  The patient does not recall events leading up to or during the fall.  He states that he has felt fine and has no pain aside from his forehead where he sustained a laceration.  He reports associated headache.  He denies any extremity weakness or vision changes.  No chest or abdominal pain.  No back or neck pain.  The history is provided by the patient and the EMS personnel.  Fall   Loss of Consciousness      Past Medical History:  Diagnosis Date  . Adjustment reaction with aggression 05/14/2017  . Arthritis   . Chronic kidney disease   . Colon polyp 2005   Tubular Adenoma  . Coronary artery disease, non-occlusive    Minimal nonobstructive CAD, nl LV systolic fxn by cath 12/11/12  . Diabetes mellitus 2001   TYPE 2  . Diverticulosis   . Epilepsy (HCC)   . GERD (gastroesophageal reflux disease) 04/23/2014  . Goiter, nontoxic, multinodular    Thyroid US 11/2012  . Hyperlipidemia 1999  . Hypertension 1999  . Internal hemorrhoids   . Neuromuscular disorder (HCC)   . OSA (obstructive sleep apnea)   . Prostate enlargement   . Sleep apnea   . Splinter 04/27/13   metal plinter removed rt pointer finger  . Stroke (HCC) 06/2015  . Vision changes 06/2015   since CVA    Patient Active Problem List   Diagnosis Date Noted  . Status post total replacement of right hip 11/12/2017  . Closed right hip fracture, initial encounter (HCC) 10/23/2017  . Closed subcapital fracture of right femur (HCC)   . Adjustment reaction with aggression  05/14/2017  . Type 2 diabetes mellitus with other circulatory complications (HCC) 06/19/2015  . Thrombocytopenia (HCC) 06/19/2015  . Cerebral infarction due to unspecified mechanism   . Dilantin toxicity 06/18/2015  . Phenytoin toxicity   . Stroke (HCC) 06/16/2015  . Fall 06/16/2015  . Epilepsy (HCC) 06/16/2015  . BPH (benign prostatic hyperplasia) 06/16/2015  . Leukocytosis 06/16/2015  . CVA (cerebral infarction) 06/16/2015  . CVA (cerebral vascular accident) (HCC) 06/15/2015  . GERD (gastroesophageal reflux disease) 04/23/2014  . Potassium (K) deficiency 12/11/2012  . Precordial pain 12/11/2012  . Dehydration 04/15/2012  . AKI (acute kidney injury) (HCC) 04/14/2012  . HLD (hyperlipidemia) 06/01/2010  . Essential hypertension, benign 06/01/2010  . DYSPNEA 06/01/2010  . Diabetes mellitus without complication (HCC) 11/04/2009  . RECTAL BLEEDING 11/04/2009  . PERSONAL HX COLONIC POLYPS 11/04/2009    Past Surgical History:  Procedure Laterality Date  . CHOLECYSTECTOMY    . COLONOSCOPY  12/28/2009  . ESOPHAGOGASTRODUODENOSCOPY  10/14/2012   normal   . INGUINAL HERNIA REPAIR  06/27/11   left  . KNEE SURGERY  2008   Left  . LEFT HEART CATHETERIZATION WITH CORONARY ANGIOGRAM N/A 12/11/2012   Procedure: LEFT HEART CATHETERIZATION WITH CORONARY ANGIOGRAM;  Surgeon: Tonny Bollman, MD;  Location: [facility] CATH LAB;  Service: Cardiovascular;  Laterality: N/A;  . TOTAL HIP ARTHROPLASTY  Right 10/23/2017   Procedure: TOTAL HIP ARTHROPLASTY ANTERIOR APPROACH;  Surgeon: Kathryne Hitch, MD;  Location: [facility] OR;  Service: Orthopedics;  Laterality: Right;        Home Medications    Prior to Admission medications   Medication Sig Start Date End Date Taking? Authorizing Provider  amLODipine (NORVASC) 2.5 MG tablet Take 2.5 mg by mouth daily.   Yes [provider]  aspirin 325 MG tablet Take 325 mg by mouth daily.   Yes [provider]  carvedilol (COREG) 3.125 MG tablet  Take 3.125 mg by mouth 2 (two) times daily with a meal.   Yes [provider]  Cholecalciferol (VITAMIN D3 PO) Take 1 tablet by mouth daily.   Yes [provider]  escitalopram (LEXAPRO) 10 MG tablet Take 10 mg by mouth daily.   Yes [provider]  galantamine (RAZADYNE ER) 16 MG 24 hr capsule Take 16 mg by mouth daily with breakfast.   Yes [provider]  levETIRAcetam (KEPPRA) 500 MG tablet Take 500 mg by mouth 2 (two) times daily.     Yes [provider]  lisinopril (PRINIVIL,ZESTRIL) 20 MG tablet Take 20 mg by mouth daily.   Yes [provider]  LORazepam (ATIVAN) 0.5 MG tablet Take 1 tablet (0.5 mg total) by mouth at bedtime. Patient taking differently: Take 0.25 mg by mouth at bedtime.  10/27/17  Yes Hongalgi, Maximino Greenland, MD  magnesium hydroxide (MILK OF MAGNESIA) 400 MG/5ML suspension Take 30 mLs by mouth at bedtime as needed for mild constipation.    Yes [provider]  magnesium oxide (MAG-OX) 400 MG tablet Take 400 mg by mouth daily.   Yes [provider]  Melatonin 3 MG TABS Take 3 mg by mouth at bedtime.   Yes [provider]  metFORMIN (GLUCOPHAGE) 500 MG tablet Take 500 mg by mouth 2 (two) times daily.    Yes [provider]  mirtazapine (REMERON) 30 MG tablet Take 30 mg by mouth at bedtime.    Yes [provider]  neomycin-bacitracin-polymyxin (NEOSPORIN) ointment Apply 1 application topically daily as needed for wound care.   Yes [provider]  pantoprazole (PROTONIX) 40 MG tablet Take 1 tablet (40 mg total) by mouth daily. 06/22/15  Yes Tat, Onalee Hua, MD  phenytoin (DILANTIN) 100 MG ER capsule Take 200 mg by mouth daily.   Yes [provider]  risperiDONE (RISPERDAL) 2 MG tablet Take 2 mg by mouth at bedtime.    Yes [provider]  traMADol (ULTRAM) 50 MG tablet Take 1 tablet (50 mg total) by mouth every 6 (six) hours as needed for moderate pain. 06/23/18  Yes  Mesner, Barbara Cower, MD  phenytoin (DILANTIN) 50 MG tablet Chew 3 tablets (150 mg total) by mouth daily. Patient not taking: Reported on 06/01/2018 10/28/17   Elease Etienne, MD  traMADol (ULTRAM) 50 MG tablet Take 1 tablet (50 mg total) by mouth every 6 (six) hours as needed. Patient not taking: Reported on 10/22/2018 06/23/18   Mesner, Barbara Cower, MD    Family History Family History  Problem Relation Age of Onset  . Heart disease Mother   . Alzheimer's disease Father   . Breast cancer Sister   . Colon cancer Unknown        unknown  . Diabetes Sister     Social History Social History   Tobacco Use  . Smoking status: Former Smoker    Types: Cigars    Last attempt to  quit: 04/14/1986    Years since quitting: 32.5  . Smokeless tobacco: Never Used  . Tobacco comment: Quit cigars in the 1980s  Substance Use Topics  . Alcohol use: No  . Drug use: No     Allergies   Patient has no known allergies.   Review of Systems Review of Systems  Cardiovascular: Positive for syncope.   All other systems reviewed and are negative except that which was mentioned in HPI   Physical Exam Updated Vital Signs BP (!) 143/69 (BP Location: Left Arm)   Pulse 68   Temp 97.8 F (36.6 C) (Oral)   Resp 17   Ht 5\' 8"  (1.727 m)   Wt 90.7 kg   SpO2 98%   BMI 30.41 kg/m   Physical Exam  Constitutional: He is oriented to person, place, and time. He appears well-developed and well-nourished. No distress.  HENT:  Head: Normocephalic.  Moist mucous membranes Hematoma R forehead  W/ 4cm laceration above scalp   Eyes: Pupils are equal, round, and reactive to light. Conjunctivae are normal.  Neck:  In c-collar  Cardiovascular: Normal rate, regular rhythm and normal heart sounds.  No murmur heard. Pulmonary/Chest: Effort normal and breath sounds normal. He exhibits no tenderness.  Abdominal: Soft. Bowel sounds are normal. He exhibits no distension. There is no tenderness.  Musculoskeletal: Normal  range of motion. He exhibits no edema.  Neurological: He is alert and oriented to person, place, and time. No sensory deficit. He exhibits normal muscle tone.  Fluent speech  Skin: Skin is warm and dry.  Psychiatric:  Flat affect  Nursing note and vitals reviewed.    ED Treatments / Results  Labs (all labs ordered are listed, but only abnormal results are displayed) Labs Reviewed  COMPREHENSIVE METABOLIC PANEL - Abnormal; Notable for the following components:      Result Value   Glucose, Bld 319 (*)    BUN 25 (*)    Alkaline Phosphatase 153 (*)    All other components within normal limits  CBC WITH DIFFERENTIAL/PLATELET - Abnormal; Notable for the following components:   RBC 4.08 (*)    Hemoglobin 12.7 (*)    HCT 38.8 (*)    Platelets 127 (*)    Eosinophils Absolute 0.9 (*)    All other components within normal limits    EKG EKG Interpretation  Date/Time:  Tuesday October 22 2018 14:33:14 EST Ventricular Rate:  68 PR Interval:    QRS Duration: 107 QT Interval:  462 QTC Calculation: 492 R Axis:     Text Interpretation:  EASI rate slower than previous Confirmed by Frederick Peers [phone]) on 10/22/2018 2:52:25 PM   Radiology Xr Hip Unilat W Or W/o Pelvis 2-3 Views Right  Result Date: 10/21/2018 AP pelvis lateral view right hip.  Hip components well-seated no signs of loosening or hardware failure.  Significant heterotopic calcification about the right hip.  Bilateral hips well located.  No acute fracture   Procedures .Marland KitchenLaceration Repair Date/Time: 10/22/2018 4:54 PM Performed by: Laurence Spates, MD Authorized by: Laurence Spates, MD   Consent:    Consent obtained:  Verbal   Consent given by:  Guardian   Risks discussed:  Pain and poor cosmetic result   Alternatives discussed:  No treatment Anesthesia (see MAR for exact dosages):    Anesthesia method:  Local infiltration and topical application   Topical anesthetic:  LET   Local anesthetic:   Lidocaine 2% WITH epi Laceration details:    Location:  Face   Face location:  Forehead   Length (cm):  4 Repair type:    Repair type:  Simple Treatment:    Area cleansed with:  Betadine   Amount of cleaning:  Standard   Irrigation solution:  Sterile saline   Irrigation method:  Syringe Skin repair:    Repair method:  Sutures   Suture size:  5-0   Suture material:  Fast-absorbing gut   Suture technique:  Simple interrupted   Number of sutures:  7 Approximation:    Approximation:  Close Post-procedure details:    Dressing:  Antibiotic ointment   Patient tolerance of procedure:  Tolerated well, no immediate complications   (including critical care time)  Medications Ordered in ED Medications  lidocaine-EPINEPHrine-tetracaine (LET) solution (has no administration in time range)  lidocaine-EPINEPHrine (XYLOCAINE W/EPI) 2 %-1:200000 (PF) injection 10 mL (has no administration in time range)     Initial Impression / Assessment and Plan / ED Course  I have reviewed the triage vital signs and the nursing notes.  Pertinent labs & imaging results that were available during my care of the patient were reviewed by me and considered in my medical decision making (see chart for details).    PT alert, comfortable on exam, forehead lac noted. See proc note for details. Labwork reassuring.  I have ordered a CT of head and C-spine to rule out acute injury and I am signing the patient out to the oncoming provider who will follow up on results.  I have discussed wound care with the patient's brother who is power of attorney.  Final Clinical Impressions(s) / ED Diagnoses   Final diagnoses:  None    ED Discharge Orders    None       Carvell Hoeffner, Ambrose Finlandachel Morgan, MD 10/22/18 1655

## 2018-10-22 NOTE — Discharge Instructions (Signed)
Keep sutures clean and dry. They will fall out on their own.  Return to ER if any signs of wound infection or for any confusion, vomiting, lethargy, or other mental status changes.

## 2018-10-22 NOTE — ED Notes (Signed)
PTAR called @ 1853-per RN-called by Marylene LandAngela

## 2018-10-22 NOTE — ED Provider Notes (Signed)
Assumed care from Dr. Clarene DukeLittle.  Briefly, the patient is a 72 year old male here with fall after leaning forward from his wheelchair.  Lab work is reassuring.  CT imaging is negative.  Patient is at his neurological baseline.  He is here with his brother and both are very pleasant and confirm that he is at his baseline.  Given his reassuring lab work and baseline mental status, will discharge him back to his facility with PTR.  Wounds dressed.  Tetanus is up-to-date.   Shaune PollackIsaacs, Arthur Criado, MD 10/22/18 [phone]

## 2018-10-22 NOTE — ED Triage Notes (Signed)
[facility] +LOC.  Pt does not remember events leading up to fall, but does report waking up feeling fine today, and denies recent illness. A/O x 4, 1.5 in lac above right eyebrow, wrapped with clean dry gauze. Pt c/o generalized HA and pain at lac site 4/10.

## 2018-10-23 DIAGNOSIS — M6281 Muscle weakness (generalized): Secondary | ICD-10-CM | POA: Diagnosis not present

## 2018-10-23 DIAGNOSIS — R2681 Unsteadiness on feet: Secondary | ICD-10-CM | POA: Diagnosis not present

## 2018-10-25 DIAGNOSIS — M6281 Muscle weakness (generalized): Secondary | ICD-10-CM | POA: Diagnosis not present

## 2018-10-25 DIAGNOSIS — R2681 Unsteadiness on feet: Secondary | ICD-10-CM | POA: Diagnosis not present

## 2018-10-28 DIAGNOSIS — M6281 Muscle weakness (generalized): Secondary | ICD-10-CM | POA: Diagnosis not present

## 2018-10-28 DIAGNOSIS — R2681 Unsteadiness on feet: Secondary | ICD-10-CM | POA: Diagnosis not present

## 2018-10-30 DIAGNOSIS — R2681 Unsteadiness on feet: Secondary | ICD-10-CM | POA: Diagnosis not present

## 2018-10-30 DIAGNOSIS — M6281 Muscle weakness (generalized): Secondary | ICD-10-CM | POA: Diagnosis not present

## 2018-10-31 DIAGNOSIS — R2681 Unsteadiness on feet: Secondary | ICD-10-CM | POA: Diagnosis not present

## 2018-10-31 DIAGNOSIS — M6281 Muscle weakness (generalized): Secondary | ICD-10-CM | POA: Diagnosis not present

## 2018-11-01 ENCOUNTER — Inpatient Hospital Stay (HOSPITAL_COMMUNITY)
Admission: EM | Admit: 2018-11-01 | Discharge: 2018-11-07 | DRG: 637 | Disposition: A | Discharge status: Skilled Nursing Facility | Payer: Medicare Other | Attending: Internal Medicine | Admitting: Internal Medicine

## 2018-11-01 ENCOUNTER — Emergency Department (HOSPITAL_COMMUNITY): Payer: Medicare Other

## 2018-11-01 ENCOUNTER — Other Ambulatory Visit: Payer: Self-pay

## 2018-11-01 ENCOUNTER — Encounter (HOSPITAL_COMMUNITY): Payer: Self-pay | Admitting: Emergency Medicine

## 2018-11-01 DIAGNOSIS — D649 Anemia, unspecified: Secondary | ICD-10-CM | POA: Diagnosis present

## 2018-11-01 DIAGNOSIS — I2510 Atherosclerotic heart disease of native coronary artery without angina pectoris: Secondary | ICD-10-CM | POA: Diagnosis present

## 2018-11-01 DIAGNOSIS — I615 Nontraumatic intracerebral hemorrhage, intraventricular: Secondary | ICD-10-CM | POA: Diagnosis present

## 2018-11-01 DIAGNOSIS — E860 Dehydration: Secondary | ICD-10-CM | POA: Diagnosis not present

## 2018-11-01 DIAGNOSIS — N390 Urinary tract infection, site not specified: Secondary | ICD-10-CM | POA: Diagnosis present

## 2018-11-01 DIAGNOSIS — B964 Proteus (mirabilis) (morganii) as the cause of diseases classified elsewhere: Secondary | ICD-10-CM | POA: Diagnosis present

## 2018-11-01 DIAGNOSIS — F39 Unspecified mood [affective] disorder: Secondary | ICD-10-CM | POA: Diagnosis present

## 2018-11-01 DIAGNOSIS — I10 Essential (primary) hypertension: Secondary | ICD-10-CM | POA: Diagnosis not present

## 2018-11-01 DIAGNOSIS — R0902 Hypoxemia: Secondary | ICD-10-CM | POA: Diagnosis not present

## 2018-11-01 DIAGNOSIS — E1122 Type 2 diabetes mellitus with diabetic chronic kidney disease: Secondary | ICD-10-CM | POA: Diagnosis present

## 2018-11-01 DIAGNOSIS — Z8673 Personal history of transient ischemic attack (TIA), and cerebral infarction without residual deficits: Secondary | ICD-10-CM

## 2018-11-01 DIAGNOSIS — W19XXXA Unspecified fall, initial encounter: Secondary | ICD-10-CM | POA: Diagnosis present

## 2018-11-01 DIAGNOSIS — Z7984 Long term (current) use of oral hypoglycemic drugs: Secondary | ICD-10-CM

## 2018-11-01 DIAGNOSIS — E871 Hypo-osmolality and hyponatremia: Secondary | ICD-10-CM | POA: Diagnosis present

## 2018-11-01 DIAGNOSIS — R296 Repeated falls: Secondary | ICD-10-CM | POA: Diagnosis present

## 2018-11-01 DIAGNOSIS — Z8249 Family history of ischemic heart disease and other diseases of the circulatory system: Secondary | ICD-10-CM

## 2018-11-01 DIAGNOSIS — Z820 Family history of epilepsy and other diseases of the nervous system: Secondary | ICD-10-CM

## 2018-11-01 DIAGNOSIS — F79 Unspecified intellectual disabilities: Secondary | ICD-10-CM | POA: Diagnosis present

## 2018-11-01 DIAGNOSIS — Z7982 Long term (current) use of aspirin: Secondary | ICD-10-CM

## 2018-11-01 DIAGNOSIS — E1165 Type 2 diabetes mellitus with hyperglycemia: Secondary | ICD-10-CM | POA: Diagnosis not present

## 2018-11-01 DIAGNOSIS — F4320 Adjustment disorder, unspecified: Secondary | ICD-10-CM | POA: Diagnosis present

## 2018-11-01 DIAGNOSIS — S0003XA Contusion of scalp, initial encounter: Secondary | ICD-10-CM | POA: Diagnosis present

## 2018-11-01 DIAGNOSIS — E876 Hypokalemia: Secondary | ICD-10-CM | POA: Diagnosis present

## 2018-11-01 DIAGNOSIS — Z9049 Acquired absence of other specified parts of digestive tract: Secondary | ICD-10-CM

## 2018-11-01 DIAGNOSIS — E1159 Type 2 diabetes mellitus with other circulatory complications: Secondary | ICD-10-CM | POA: Diagnosis present

## 2018-11-01 DIAGNOSIS — E870 Hyperosmolality and hypernatremia: Secondary | ICD-10-CM | POA: Diagnosis not present

## 2018-11-01 DIAGNOSIS — F0390 Unspecified dementia without behavioral disturbance: Secondary | ICD-10-CM | POA: Diagnosis present

## 2018-11-01 DIAGNOSIS — I639 Cerebral infarction, unspecified: Secondary | ICD-10-CM | POA: Diagnosis present

## 2018-11-01 DIAGNOSIS — M1990 Unspecified osteoarthritis, unspecified site: Secondary | ICD-10-CM | POA: Diagnosis present

## 2018-11-01 DIAGNOSIS — R4182 Altered mental status, unspecified: Secondary | ICD-10-CM | POA: Diagnosis not present

## 2018-11-01 DIAGNOSIS — Z96641 Presence of right artificial hip joint: Secondary | ICD-10-CM | POA: Diagnosis present

## 2018-11-01 DIAGNOSIS — E785 Hyperlipidemia, unspecified: Secondary | ICD-10-CM | POA: Diagnosis present

## 2018-11-01 DIAGNOSIS — G9341 Metabolic encephalopathy: Secondary | ICD-10-CM

## 2018-11-01 DIAGNOSIS — I129 Hypertensive chronic kidney disease with stage 1 through stage 4 chronic kidney disease, or unspecified chronic kidney disease: Secondary | ICD-10-CM | POA: Diagnosis present

## 2018-11-01 DIAGNOSIS — Z8719 Personal history of other diseases of the digestive system: Secondary | ICD-10-CM

## 2018-11-01 DIAGNOSIS — Z803 Family history of malignant neoplasm of breast: Secondary | ICD-10-CM

## 2018-11-01 DIAGNOSIS — R739 Hyperglycemia, unspecified: Secondary | ICD-10-CM

## 2018-11-01 DIAGNOSIS — Z87891 Personal history of nicotine dependence: Secondary | ICD-10-CM

## 2018-11-01 DIAGNOSIS — I629 Nontraumatic intracranial hemorrhage, unspecified: Secondary | ICD-10-CM | POA: Diagnosis not present

## 2018-11-01 DIAGNOSIS — G40909 Epilepsy, unspecified, not intractable, without status epilepticus: Secondary | ICD-10-CM | POA: Diagnosis present

## 2018-11-01 DIAGNOSIS — Z833 Family history of diabetes mellitus: Secondary | ICD-10-CM

## 2018-11-01 DIAGNOSIS — R29703 NIHSS score 3: Secondary | ICD-10-CM | POA: Diagnosis present

## 2018-11-01 DIAGNOSIS — N189 Chronic kidney disease, unspecified: Secondary | ICD-10-CM | POA: Diagnosis present

## 2018-11-01 DIAGNOSIS — N400 Enlarged prostate without lower urinary tract symptoms: Secondary | ICD-10-CM | POA: Diagnosis present

## 2018-11-01 DIAGNOSIS — Z993 Dependence on wheelchair: Secondary | ICD-10-CM

## 2018-11-01 LAB — COMPREHENSIVE METABOLIC PANEL
ALT: 16 U/L (ref 0–44)
AST: 12 U/L — ABNORMAL LOW (ref 15–41)
Albumin: 3.7 g/dL (ref 3.5–5.0)
Alkaline Phosphatase: 162 U/L — ABNORMAL HIGH (ref 38–126)
Anion gap: 12 (ref 5–15)
BUN: 34 mg/dL — AB (ref 8–23)
CO2: 26 mmol/L (ref 22–32)
Calcium: 9.7 mg/dL (ref 8.9–10.3)
Chloride: 113 mmol/L — ABNORMAL HIGH (ref 98–111)
Creatinine, Ser: 1.23 mg/dL (ref 0.61–1.24)
GFR calc Af Amer: >60 mL/min (ref >60)
GFR calc non Af Amer: 58 mL/min — ABNORMAL LOW (ref >60)
Glucose, Bld: 425 mg/dL — ABNORMAL HIGH (ref 70–99)
Potassium: 4 mmol/L (ref 3.5–5.1)
Sodium: 151 mmol/L — ABNORMAL HIGH (ref 135–145)
Total Bilirubin: 0.6 mg/dL (ref 0.3–1.2)
Total Protein: 7 g/dL (ref 6.5–8.1)

## 2018-11-01 LAB — CBC WITH DIFFERENTIAL/PLATELET
ABS IMMATURE GRANULOCYTES: 0.05 10*3/uL (ref 0.00–0.07)
BASOS ABS: 0.1 10*3/uL (ref 0.0–0.1)
Basophils Relative: 1 %
Eosinophils Absolute: 0.9 10*3/uL — ABNORMAL HIGH (ref 0.0–0.5)
Eosinophils Relative: 8 %
HEMATOCRIT: 40.5 % (ref 39.0–52.0)
HEMOGLOBIN: 12.7 g/dL — AB (ref 13.0–17.0)
IMMATURE GRANULOCYTES: 0 %
LYMPHS ABS: 1.6 10*3/uL (ref 0.7–4.0)
LYMPHS PCT: 14 %
MCH: 31.4 pg (ref 26.0–34.0)
MCHC: 31.4 g/dL (ref 30.0–36.0)
MCV: 100 fL (ref 80.0–100.0)
MONOS PCT: 8 %
Monocytes Absolute: 0.9 10*3/uL (ref 0.1–1.0)
NEUTROS ABS: 7.7 10*3/uL (ref 1.7–7.7)
NEUTROS PCT: 69 %
NRBC: 0 % (ref 0.0–0.2)
Platelets: 150 10*3/uL (ref 150–400)
RBC: 4.05 MIL/uL — ABNORMAL LOW (ref 4.22–5.81)
RDW: 12.7 % (ref 11.5–15.5)
WBC: 11.2 10*3/uL — ABNORMAL HIGH (ref 4.0–10.5)

## 2018-11-01 LAB — I-STAT TROPONIN, ED: Troponin i, poc: 0 ng/mL (ref 0.00–0.08)

## 2018-11-01 LAB — MRSA PCR SCREENING: MRSA BY PCR: NEGATIVE

## 2018-11-01 LAB — CBG MONITORING, ED
GLUCOSE-CAPILLARY: 374 mg/dL — AB (ref 70–99)
Glucose-Capillary: 323 mg/dL — ABNORMAL HIGH (ref 70–99)
Glucose-Capillary: 219 mg/dL — ABNORMAL HIGH (ref 70–99)

## 2018-11-01 LAB — GLUCOSE, CAPILLARY
GLUCOSE-CAPILLARY: 206 mg/dL — AB (ref 70–99)
GLUCOSE-CAPILLARY: 156 mg/dL — AB (ref 70–99)

## 2018-11-01 LAB — PHENYTOIN LEVEL, TOTAL: Phenytoin Lvl: 8.9 ug/mL — ABNORMAL LOW (ref 10.0–20.0)

## 2018-11-01 LAB — HEMOGLOBIN A1C
Hgb A1c MFr Bld: 9.2 % — ABNORMAL HIGH (ref 4.8–5.6)
Mean Plasma Glucose: 217.34 mg/dL

## 2018-11-01 MED ORDER — INSULIN ASPART 100 UNIT/ML ~~LOC~~ SOLN
5.0000 [IU] | Freq: Once | SUBCUTANEOUS | Status: AC
  Administered 2018-11-01: 5 [IU] via SUBCUTANEOUS

## 2018-11-01 MED ORDER — LORAZEPAM 0.5 MG PO TABS
0.2500 mg | ORAL_TABLET | Freq: Every day | ORAL | Status: DC
  Administered 2018-11-01 – 2018-11-06 (×6): 0.25 mg via ORAL
  Filled 2018-11-01 (×6): qty 1

## 2018-11-01 MED ORDER — ONDANSETRON HCL 4 MG PO TABS
4.0000 mg | ORAL_TABLET | Freq: Four times a day (QID) | ORAL | Status: DC | PRN
  Administered 2018-11-02: 4 mg via ORAL
  Filled 2018-11-01: qty 1

## 2018-11-01 MED ORDER — TRAMADOL HCL 50 MG PO TABS
50.0000 mg | ORAL_TABLET | Freq: Four times a day (QID) | ORAL | Status: DC | PRN
  Administered 2018-11-02: 50 mg via ORAL
  Filled 2018-11-01: qty 1

## 2018-11-01 MED ORDER — MAGNESIUM HYDROXIDE 400 MG/5ML PO SUSP: 30.0000 mL | Freq: Every evening | ORAL | Status: DC | PRN

## 2018-11-01 MED ORDER — PANTOPRAZOLE SODIUM 40 MG PO TBEC
40.0000 mg | DELAYED_RELEASE_TABLET | Freq: Every day | ORAL | Status: DC
  Administered 2018-11-02 – 2018-11-07 (×6): 40 mg via ORAL
  Filled 2018-11-01 (×6): qty 1

## 2018-11-01 MED ORDER — SODIUM CHLORIDE 0.9 % IV BOLUS
1000.0000 mL | Freq: Once | INTRAVENOUS | Status: AC
Start: 2018-11-01 — End: 2018-11-01
  Administered 2018-11-01: 1000 mL via INTRAVENOUS

## 2018-11-01 MED ORDER — AMLODIPINE BESYLATE 5 MG PO TABS
2.5000 mg | ORAL_TABLET | Freq: Every day | ORAL | Status: DC
  Administered 2018-11-01 – 2018-11-07 (×7): 2.5 mg via ORAL
  Filled 2018-11-01 (×7): qty 1

## 2018-11-01 MED ORDER — MIRTAZAPINE 15 MG PO TABS
30.0000 mg | ORAL_TABLET | Freq: Every day | ORAL | Status: DC
  Administered 2018-11-01 – 2018-11-06 (×6): 30 mg via ORAL
  Filled 2018-11-01 (×6): qty 2

## 2018-11-01 MED ORDER — GALANTAMINE HYDROBROMIDE ER 8 MG PO CP24
16.0000 mg | ORAL_CAPSULE | Freq: Every day | ORAL | Status: DC
  Administered 2018-11-02 – 2018-11-07 (×6): 16 mg via ORAL
  Filled 2018-11-01 (×6): qty 2

## 2018-11-01 MED ORDER — ESCITALOPRAM OXALATE 10 MG PO TABS
10.0000 mg | ORAL_TABLET | Freq: Every day | ORAL | Status: DC
  Administered 2018-11-01 – 2018-11-07 (×8): 10 mg via ORAL
  Filled 2018-11-01 (×7): qty 1

## 2018-11-01 MED ORDER — ACETAMINOPHEN 325 MG PO TABS
650.0000 mg | ORAL_TABLET | Freq: Four times a day (QID) | ORAL | Status: DC | PRN
  Administered 2018-11-04 – 2018-11-05 (×3): 650 mg via ORAL
  Filled 2018-11-01 (×3): qty 2

## 2018-11-01 MED ORDER — SODIUM CHLORIDE 0.45 % IV SOLN
INTRAVENOUS | Status: DC
  Administered 2018-11-01 – 2018-11-02 (×2): via INTRAVENOUS

## 2018-11-01 MED ORDER — INSULIN ASPART 100 UNIT/ML ~~LOC~~ SOLN
0.0000 [IU] | Freq: Three times a day (TID) | SUBCUTANEOUS | Status: DC
  Administered 2018-11-01: 5 [IU] via SUBCUTANEOUS
  Administered 2018-11-02: 3 [IU] via SUBCUTANEOUS
  Administered 2018-11-02: 5 [IU] via SUBCUTANEOUS
  Administered 2018-11-02: 3 [IU] via SUBCUTANEOUS
  Administered 2018-11-03: 5 [IU] via SUBCUTANEOUS
  Administered 2018-11-03 (×2): 8 [IU] via SUBCUTANEOUS
  Administered 2018-11-04 – 2018-11-05 (×3): 5 [IU] via SUBCUTANEOUS
  Administered 2018-11-05: 3 [IU] via SUBCUTANEOUS
  Administered 2018-11-06: 8 [IU] via SUBCUTANEOUS
  Administered 2018-11-06: 2 [IU] via SUBCUTANEOUS
  Administered 2018-11-06 – 2018-11-07 (×2): 5 [IU] via SUBCUTANEOUS
  Administered 2018-11-07: 3 [IU] via SUBCUTANEOUS

## 2018-11-01 MED ORDER — ONDANSETRON HCL 4 MG/2ML IJ SOLN: 4.0000 mg | Freq: Four times a day (QID) | INTRAMUSCULAR | Status: DC | PRN

## 2018-11-01 MED ORDER — SODIUM CHLORIDE 0.9 % IV BOLUS
1000.0000 mL | Freq: Once | INTRAVENOUS | Status: AC
  Administered 2018-11-01: 1000 mL via INTRAVENOUS

## 2018-11-01 MED ORDER — CARVEDILOL 3.125 MG PO TABS
3.1250 mg | ORAL_TABLET | Freq: Two times a day (BID) | ORAL | Status: DC
  Administered 2018-11-01 – 2018-11-07 (×12): 3.125 mg via ORAL
  Filled 2018-11-01 (×11): qty 1

## 2018-11-01 MED ORDER — RISPERIDONE 2 MG PO TABS
2.0000 mg | ORAL_TABLET | Freq: Every day | ORAL | Status: DC
  Administered 2018-11-01 – 2018-11-06 (×6): 2 mg via ORAL
  Filled 2018-11-01 (×7): qty 1

## 2018-11-01 MED ORDER — ACETAMINOPHEN 650 MG RE SUPP: 650.0000 mg | Freq: Four times a day (QID) | RECTAL | Status: DC | PRN

## 2018-11-01 MED ORDER — BACITRACIN-NEOMYCIN-POLYMYXIN 400-5-5000 EX OINT
1.0000 "application " | TOPICAL_OINTMENT | Freq: Every day | CUTANEOUS | Status: DC | PRN
  Administered 2018-11-03: 1 via TOPICAL
  Filled 2018-11-01: qty 1

## 2018-11-01 MED ORDER — PHENYTOIN SODIUM EXTENDED 100 MG PO CAPS
200.0000 mg | ORAL_CAPSULE | Freq: Every day | ORAL | Status: DC
  Administered 2018-11-01 – 2018-11-04 (×4): 200 mg via ORAL
  Filled 2018-11-01 (×4): qty 2

## 2018-11-01 MED ORDER — LEVETIRACETAM 500 MG PO TABS
500.0000 mg | ORAL_TABLET | Freq: Two times a day (BID) | ORAL | Status: DC
  Administered 2018-11-01 – 2018-11-07 (×12): 500 mg via ORAL
  Filled 2018-11-01 (×12): qty 1

## 2018-11-01 MED ORDER — MELATONIN 3 MG PO TABS
3.0000 mg | ORAL_TABLET | Freq: Every day | ORAL | Status: DC
  Administered 2018-11-01 – 2018-11-06 (×6): 3 mg via ORAL
  Filled 2018-11-01 (×7): qty 1

## 2018-11-01 MED ORDER — INSULIN ASPART 100 UNIT/ML ~~LOC~~ SOLN
0.0000 [IU] | Freq: Every day | SUBCUTANEOUS | Status: DC
  Administered 2018-11-02: 2 [IU] via SUBCUTANEOUS
  Administered 2018-11-03: 3 [IU] via SUBCUTANEOUS

## 2018-11-01 MED ORDER — WHITE PETROLATUM EX OINT
TOPICAL_OINTMENT | CUTANEOUS | Status: AC
  Filled 2018-11-01: qty 28.35

## 2018-11-01 MED ORDER — GLIPIZIDE 5 MG PO TABS
5.0000 mg | ORAL_TABLET | Freq: Two times a day (BID) | ORAL | Status: DC
  Administered 2018-11-02 (×2): 5 mg via ORAL
  Filled 2018-11-01 (×4): qty 1

## 2018-11-01 MED ORDER — LISINOPRIL 20 MG PO TABS
20.0000 mg | ORAL_TABLET | Freq: Every day | ORAL | Status: DC
  Administered 2018-11-01 – 2018-11-07 (×7): 20 mg via ORAL
  Filled 2018-11-01 (×7): qty 1

## 2018-11-01 MED ORDER — MAGNESIUM OXIDE 400 (241.3 MG) MG PO TABS
400.0000 mg | ORAL_TABLET | Freq: Every day | ORAL | Status: DC
  Administered 2018-11-01 – 2018-11-03 (×3): 400 mg via ORAL
  Filled 2018-11-01 (×3): qty 1

## 2018-11-01 NOTE — Progress Notes (Signed)
New Admission Note: [facility]  Arrival Method: via stretcher Mental Orientation: alert to self Telemetry: none ordered Assessment: Completed Skin: bruise to R forehead IV:  NSL Pain: denies Tubes: None Safety Measures: Safety Fall Prevention Plan has been discussed, placed on low bed Admission: to becompleted 6 [place] Orientation: Patient has been orientated to the room, unit and staff.  Family: sister at bedside  Orders to be reviewed and implemented. Will continue to monitor the patient. Call light has been placed within reach and bed alarm has been activated.  Burley SaverKami Larnell Granlund, RN 3

## 2018-11-01 NOTE — ED Triage Notes (Signed)
Pt arrives via [facility] for hyperglycemia. Pt is base line dementia with confusion- pt is alert and oriented to person. Pts cbg was 500 at [facility] and 340's on arrival to ED.

## 2018-11-01 NOTE — ED Notes (Signed)
confused

## 2018-11-01 NOTE — ED Notes (Signed)
Report called to rn on [id number] camey  Needs to get a low bed 10 minutes

## 2018-11-01 NOTE — H&P (Signed)
History and Physical    Arthur BurySamuel W Vierling ZOX:[id number] DOB: [birth year] DOA: 11/01/2018  PCP: Rodrigo RanPerini, Mark, MD   Patient coming from: Nursing home  Chief Complaint: Altered mental status.  HPI: Arthur Berry is a 72 y.o. male with past medical history significant for slight confusion CKD, CAD, diabetes mellitus, history of epilepsy, stroke who [place] [facility].  Patient is a poor historian history obtained from chart review, ER patient and also from patient.  He had a recent head injury from fall and had CT head that showed no intracranial bleeding and had suture placed.  He was sent from nursing home due to uncontrolled blood sugar in 500, worsening of his mental status, " patient was not being himself".  Apparently patient has not been eating well.  There was no reported recent falls since the last one.  Patient is alert awake able to interact and answer some questions, reports he does not have any pain.  He denies any nausea vomiting, shortness of breath, chest pain, fever or chills.  ED Course: Blood pressure 130/84, respiration 13/min saturating 90% on room air.Blood work showed hyponatremia with sodium 151, blood sugar 425, mild leukocytosis 11,200.  Chest x-ray no acute finding,  CT head "1. Trace intraventricular hemorrhage.Possible 3 mm petechial hemorrhage in the left frontal lobe. Decreased size of frontal scalp hematoma." subsequently had MRI brain "Incomplete, motion degraded examination. 2. 2 cm diffusion abnormality involving left frontoparietal cortex at the vertex which may reflect an acute/early subacute infarct or possibly contusion. 3. Possible additional punctate acute to early subacute infarcts in the left frontal lobe and left insula. 4. Trace intraventricular hemorrhage. 5. Chronic changes" On-call neurosurgeon  Dr Wynetta Emeryram was contacted, who does not think it is clinically significant and did not recommend a repeat CT or any neurosurgical evaluation at this time. Given  his dehydration, hypernatremia and hyperglycemia,he is being admitted for further management.  Review of Systems: All systems are reviewed and were negative except as mentioned in HPI above   Past Medical History:  Diagnosis Date  . Adjustment reaction with aggression 05/14/2017  . Arthritis   . Chronic kidney disease   . Colon polyp 2005   Tubular Adenoma  . Coronary artery disease, non-occlusive    Minimal nonobstructive CAD, nl LV systolic fxn by cath 12/11/12  . Diabetes mellitus 2001   TYPE 2  . Diverticulosis   . Epilepsy (HCC)   . GERD (gastroesophageal reflux disease) 04/23/2014  . Goiter, nontoxic, multinodular    Thyroid US 11/2012  . Hyperlipidemia 1999  . Hypertension 1999  . Internal hemorrhoids   . Neuromuscular disorder (HCC)   . OSA (obstructive sleep apnea)   . Prostate enlargement   . Sleep apnea   . Splinter 04/27/13   metal plinter removed rt pointer finger  . Stroke (HCC) 06/2015  . Vision changes 06/2015   since CVA    Past Surgical History:  Procedure Laterality Date  . CHOLECYSTECTOMY    . COLONOSCOPY  12/28/2009  . ESOPHAGOGASTRODUODENOSCOPY  10/14/2012   normal   . INGUINAL HERNIA REPAIR  06/27/11   left  . KNEE SURGERY  2008   Left  . LEFT HEART CATHETERIZATION WITH CORONARY ANGIOGRAM N/A 12/11/2012   Procedure: LEFT HEART CATHETERIZATION WITH CORONARY ANGIOGRAM;  Surgeon: Tonny BollmanMichael Cooper, MD;  [facility];  Service: Cardiovascular;  Laterality: N/A;  . TOTAL HIP ARTHROPLASTY Right 10/23/2017   Procedure: TOTAL HIP ARTHROPLASTY ANTERIOR APPROACH;  Surgeon: Kathryne HitchBlackman, Christopher Y, MD;  Location: [facility] OR;  Service: Orthopedics;  Laterality: Right;     reports that he quit smoking about 32 years ago. His smoking use included cigars. He has never used smokeless tobacco. He reports that he does not drink alcohol or use drugs.  No Known Allergies  Family History  Problem Relation Age of Onset  . Heart disease Mother   . Alzheimer's disease  Father   . Breast cancer Sister   . Colon cancer Other        unknown  . Diabetes Sister      Prior to Admission medications   Medication Sig Start Date End Date Taking? Authorizing Provider  amLODipine (NORVASC) 2.5 MG tablet Take 2.5 mg by mouth daily.   Yes [provider]  aspirin 325 MG tablet Take 325 mg by mouth daily.   Yes [provider]  carvedilol (COREG) 3.125 MG tablet Take 3.125 mg by mouth 2 (two) times daily with a meal.   Yes [provider]  Cholecalciferol (VITAMIN D3 PO) Take 1 tablet by mouth daily.   Yes [provider]  escitalopram (LEXAPRO) 10 MG tablet Take 10 mg by mouth daily.   Yes [provider]  galantamine (RAZADYNE ER) 16 MG 24 hr capsule Take 16 mg by mouth daily with breakfast.   Yes [provider]  levETIRAcetam (KEPPRA) 500 MG tablet Take 500 mg by mouth 2 (two) times daily.     Yes [provider]  lisinopril (PRINIVIL,ZESTRIL) 20 MG tablet Take 20 mg by mouth daily.   Yes [provider]  LORazepam (ATIVAN) 0.5 MG tablet Take 1 tablet (0.5 mg total) by mouth at bedtime. Patient taking differently: Take 0.25 mg by mouth at bedtime.  10/27/17  Yes Hongalgi, Maximino GreenlandAnand D, MD  magnesium hydroxide (MILK OF MAGNESIA) 400 MG/5ML suspension Take 30 mLs by mouth at bedtime as needed for mild constipation.    Yes [provider]  magnesium oxide (MAG-OX) 400 MG tablet Take 400 mg by mouth daily.   Yes [provider]  Melatonin 3 MG TABS Take 3 mg by mouth at bedtime.   Yes [provider]  metFORMIN (GLUCOPHAGE) 500 MG tablet Take 500 mg by mouth 2 (two) times daily.    Yes [provider]  mirtazapine (REMERON) 30 MG tablet Take 30 mg by mouth at bedtime.    Yes [provider]  neomycin-bacitracin-polymyxin (NEOSPORIN) ointment Apply 1 application topically daily as needed for wound care.   Yes [provider]  pantoprazole (PROTONIX) 40  MG tablet Take 1 tablet (40 mg total) by mouth daily. 06/22/15  Yes Tat, Onalee Huaavid, MD  phenytoin (DILANTIN) 100 MG ER capsule Take 200 mg by mouth daily.   Yes [provider]  risperiDONE (RISPERDAL) 2 MG tablet Take 2 mg by mouth at bedtime.    Yes [provider]  traMADol (ULTRAM) 50 MG tablet Take 1 tablet (50 mg total) by mouth every 6 (six) hours as needed. Patient taking differently: Take 50 mg by mouth every 6 (six) hours as needed for moderate pain.  06/23/18  Yes Mesner, Barbara CowerJason, MD  phenytoin (DILANTIN) 50 MG tablet Chew 3 tablets (150 mg total) by mouth daily. Patient not taking: Reported on 06/01/2018 10/28/17   Elease EtienneHongalgi, Anand D, MD  traMADol (ULTRAM) 50 MG tablet Take 1 tablet (50 mg total) by mouth every 6 (six) hours as needed for moderate pain. Patient not taking: Reported on 11/01/2018 06/23/18   Mesner, Barbara CowerJason, MD  Physical Exam: Vitals:   11/01/18 1117 11/01/18 1230 11/01/18 1300  BP:  135/84 (!) 155/88  Pulse: 72    Resp: 12 14 14   Temp: 98.1 F (36.7 C)    TempSrc: Oral    SpO2: 100%      Constitutional: NAD, calm, comfortable Vitals:   11/01/18 1117 11/01/18 1230 11/01/18 1300  BP:  135/84 (!) 155/88  Pulse: 72    Resp: 12 14 14   Temp: 98.1 F (36.7 C)    TempSrc: Oral    SpO2: 100%    General: Alert awake oriented to self, his date of birth, knows the hospital but unable to tell me the name. Eyes: PERRL, lids and conjunctivae normal ENMT: Mucous membranes are DRY. normal dentition.  Neck: normal, supple, no masses, no thyromegaly Respiratory: clear to auscultation bilaterally, no wheezing, no crackles. On RA Cardiovascular: Regular rate and rhythm, no murmurs / rubs / gallops. No extremity edema. 2+ pedal pulses. No carotid bruits.  Abdomen: no tenderness, no masses palpated. No hepatosplenomegaly. Bowel sounds positive.  Musculoskeletal: no clubbing / cyanosis.Good ROM, no contractures. Normal muscle tone.  Skin: no rashes, lesions, ulcers.  No induration Neurologic: CN 2-12 grossly intact. Sensation intact, DTR normal. Strength 5/5 in all 4.  Psychiatric: Normal judgment and insight. Alert and oriented x 2.Normal mood.  Bruise/ecchymosis present on the right forehead, right lateral eye brow, right cheek.  Labs on Admission: I have personally reviewed following labs and imaging studies  CBC: Recent Labs  Lab 11/01/18 1036  WBC 11.2*  NEUTROABS 7.7  HGB 12.7*  HCT 40.5  MCV 100.0  PLT 150   Basic Metabolic Panel: Recent Labs  Lab 11/01/18 1036  NA 151*  K 4.0  CL 113*  CO2 26  GLUCOSE 425*  BUN 34*  CREATININE 1.23  CALCIUM 9.7   GFR: Estimated Creatinine Clearance: 59.4 mL/min (by C-G formula based on SCr of 1.23 mg/dL). Liver Function Tests: Recent Labs  Lab 11/01/18 1036  AST 12*  ALT 16  ALKPHOS 162*  BILITOT 0.6  PROT 7.0  ALBUMIN 3.7   No results for input(s): LIPASE, AMYLASE in the last 168 hours. No results for input(s): AMMONIA in the last 168 hours. Coagulation Profile: No results for input(s): INR, PROTIME in the last 168 hours. Cardiac Enzymes: No results for input(s): CKTOTAL, CKMB, CKMBINDEX, TROPONINI in the last 168 hours. BNP (last 3 results) No results for input(s): PROBNP in the last 8760 hours. HbA1C: No results for input(s): HGBA1C in the last 72 hours. CBG: Recent Labs  Lab 11/01/18 1029 11/01/18 1242  GLUCAP 374* 323*   Lipid Profile: No results for input(s): CHOL, HDL, LDLCALC, TRIG, CHOLHDL, LDLDIRECT in the last 72 hours. Thyroid Function Tests: No results for input(s): TSH, T4TOTAL, FREET4, T3FREE, THYROIDAB in the last 72 hours. Anemia Panel: No results for input(s): VITAMINB12, FOLATE, FERRITIN, TIBC, IRON, RETICCTPCT in the last 72 hours. Urine analysis:    Component Value Date/Time   COLORURINE STRAW (A) 07/21/2018 1559   APPEARANCEUR CLEAR 07/21/2018 1559   LABSPEC 1.026 07/21/2018 1559   PHURINE 7.0 07/21/2018 1559   GLUCOSEU >=500 (A) 07/21/2018  1559   HGBUR SMALL (A) 07/21/2018 1559   BILIRUBINUR NEGATIVE 07/21/2018 1559   KETONESUR NEGATIVE 07/21/2018 1559   PROTEINUR NEGATIVE 07/21/2018 1559   UROBILINOGEN 0.2 06/15/2015 2200   NITRITE NEGATIVE 07/21/2018 1559   LEUKOCYTESUR NEGATIVE 07/21/2018 1559    Radiological Exams on Admission: Dg Chest 1 View  Result Date: 11/01/2018 CLINICAL  DATA:  Altered mental status, hypertension EXAM: CHEST  1 VIEW COMPARISON:  06/01/2018 FINDINGS: Heart and mediastinal contours are within normal limits. No focal opacities or effusions. No acute bony abnormality. IMPRESSION: No active disease. Electronically Signed   By: Charlett Nose M.D.   On: 11/01/2018 11:06   Ct Head Wo Contrast  Result Date: 11/01/2018 CLINICAL DATA:  Altered level of consciousness. Hyperglycemia. History of dementia. EXAM: CT HEAD WITHOUT CONTRAST TECHNIQUE: Contiguous axial images were obtained from the base of the skull through the vertex without intravenous contrast. COMPARISON:  10/22/2018 FINDINGS: Brain: Trace hyperdense blood products are present in the occipital horns of the lateral ventricles, new from the prior study. There may be a 3 mm focus of petechial hemorrhage in the left frontal lobe along the lateral margin of the cyst. No intracranial hemorrhage is identified elsewhere. Congenital brain anomalies are again noted including agenesis of the corpus callosum, colpocephaly, and an interhemispheric cyst centered left of midline. The ventricles are unchanged in size and configuration. Patchy cerebral white matter hypodensities are unchanged and nonspecific but may reflect mild chronic small vessel ischemic disease. There is mild cerebellar atrophy. No acute infarct or extra-axial fluid collection is identified. Vascular: Calcified atherosclerosis at the skull base. No hyperdense vessel. Skull: No fracture or focal osseous lesion. Sinuses/Orbits: Minimal mucosal thickening in the ethmoid air cells. Clear mastoid air  cells. Unremarkable orbits. Other: Decreased size of right frontal scalp hematoma. IMPRESSION: 1. Trace intraventricular hemorrhage. 2. Possible 3 mm petechial hemorrhage in the left frontal lobe. 3. Decreased size of frontal scalp hematoma. Critical Value/emergent results were called by telephone at the time of interpretation on 11/01/2018 at 1:22 pm to Dr. Chaney Malling , who verbally acknowledged these results. Electronically Signed   By: Sebastian Ache M.D.   On: 11/01/2018 13:22   [name]  Result Date: 11/01/2018 CLINICAL DATA:  Altered mental status. Hyperglycemia. History of dementia. Fall 10 days ago. EXAM: MRI HEAD WITHOUT CONTRAST TECHNIQUE: Multiplanar, multiecho pulse sequences of the brain and surrounding structures were obtained without intravenous contrast. COMPARISON:  Head CT 11/01/2018 and MRI 06/16/2015 FINDINGS: The patient was unable to tolerate the complete examination. Axial and coronal diffusion, sagittal T1, and axial T2 sequences were obtained. The axial T2 sequence is moderately motion degraded, and there is mild motion on the diffusion imaging. Brain: There is a 2 cm region of abnormal trace diffusion signal with normal to mildly reduced ADC involving left frontoparietal cortex at the vertex suggesting an acute to early subacute infarct. Two additional punctate foci of abnormal diffusion signal are noted in the left frontal lobe along the margin of the interhemispheric cyst and in the left insula and may also represent recent tiny infarcts although diffusion signal in the left frontal lobe corresponds to the questioned petechial hemorrhage on CT and could be artifactual. Minimal signal abnormality dependently in the occipital horns of both lateral ventricles corresponds to the trace hemorrhage reported on earlier CT. Chronic changes including agenesis of the corpus callosum, a large inter hemispheric cyst extending left of midline, and cerebellar atrophy are again noted. There  is colpocephaly with unchanged size and configuration of the lateral ventricles. Chronic cerebral white matter disease is incompletely evaluated. Vascular: Major intracranial arterial flow voids are grossly preserved. T2 hyperintensity in the non dominant left sigmoid sinus may reflect slow flow or less likely occlusion. Skull and upper cervical spine: No suspicious marrow lesion. Sinuses/Orbits: Grossly unremarkable orbits.  Clear sinuses. Other: Small right  frontal scalp hematoma. IMPRESSION: 1. Incomplete, motion degraded examination. 2. 2 cm diffusion abnormality involving left frontoparietal cortex at the vertex which may reflect an acute/early subacute infarct or possibly contusion. 3. Possible additional punctate acute to early subacute infarcts in the left frontal lobe and left insula. 4. Trace intraventricular hemorrhage. 5. Chronic changes as above. Electronically Signed   By: Sebastian Ache M.D.   On: 11/01/2018 14:10     Assessment/Plan  Hypernatremia : Suspect secondary to dehydration/poor oral intake.  Patient got 2 L normal seen bolus in the ER.  Now we will continue on 0.45 normal saline, monitor labs.   Diabetes mellitus with uncontrolled hyperglycemia : We will give the patient on IV fluids as above.  Add sliding scale insulin.  Hold his metformin.  I am not sure if he is able to go on insulin given his overall mental status, we could try glipizide. Check hba1c.  Last hemoglobin a1c was 7.0 in 2016  Recent fall with bruise ecchymosis on the face and CT?MRI reviewed, with trace intraventricular hemorrhage. On-call neurosurgeon  Dr Wynetta Emery was contacted, who does not think it is clinically significant and did not recommend a repeat CT or any neurosurgical evaluation at this time.monitor. grossly nonfocal. Obtain PT/OT evaluation  Hypertension: Blood pressure stable continue amlodipine, Coreg  Hx of Stroke/dementia with baseline confusion/epilepsy : Dilantin level borderline, continue on his  home medication Dilantin, Keppra and seizure precaution.  Hold off aspirin due to trace intraventricular hemorrhage. Continue on his multiple home psych medication including Lexapro, galantamine, Ativan, melatonin, Remeron, risperadal.  Severity of Illness: The appropriate patient status for this patient is OBSERVATION. Observation status is judged to be reasonable and necessary in order to provide the required intensity of service to ensure the patient's safety. The patient's presenting symptoms, physical exam findings, and initial radiographic and laboratory data in the context of their medical condition is felt to place them at decreased risk for further clinical deterioration. Furthermore, it is anticipated that the patient will be medically stable for discharge from the hospital within 2 midnights of admission.    DVT prophylaxis:  SCD Code Status: Full code Family Communication: no family at bedside Consults called: Neurosurgery called by ER   Lanae Boast MD [facility] Pager [phone]  If 7PM-7AM, please contact night-coverage [email or web address] Password [email or web address]  11/01/2018, 3:01 PM

## 2018-11-01 NOTE — ED Provider Notes (Signed)
[facility] EMERGENCY DEPARTMENT Provider Note   CSN: [id number] Arrival date & time: 11/01/18  1034     History   Chief Complaint Chief Complaint  Patient presents with  . Hyperglycemia    HPI Arthur Berry is a 72 y.o. male hx of CKD, CAD, DM, seizure, here with confusion, hyperglycemia.  Patient is from [facility].  Patient was last seen yesterday by his brother and appears to be slightly dehydrated.  This morning, patient is more confused than usual.  Patient apparently had some weak hand grasp per the nurses at the facility.  Patient also has not been eating and drinking much.  Glucose was 500 [facility].  EMS states that CBG was 350 in route.  Patient had recent head injury and had a CT head that showed no intracranial bleeding and has sutures placed.  Patient has no recent head injury.   The history is provided by the patient.   Level V caveat- dementia   Past Medical History:  Diagnosis Date  . Adjustment reaction with aggression 05/14/2017  . Arthritis   . Chronic kidney disease   . Colon polyp 2005   Tubular Adenoma  . Coronary artery disease, non-occlusive    Minimal nonobstructive CAD, nl LV systolic fxn by cath 12/11/12  . Diabetes mellitus 2001   TYPE 2  . Diverticulosis   . Epilepsy (HCC)   . GERD (gastroesophageal reflux disease) 04/23/2014  . Goiter, nontoxic, multinodular    Thyroid US 11/2012  . Hyperlipidemia 1999  . Hypertension 1999  . Internal hemorrhoids   . Neuromuscular disorder (HCC)   . OSA (obstructive sleep apnea)   . Prostate enlargement   . Sleep apnea   . Splinter 04/27/13   metal plinter removed rt pointer finger  . Stroke (HCC) 06/2015  . Vision changes 06/2015   since CVA    Patient Active Problem List   Diagnosis Date Noted  . Status post total replacement of right hip 11/12/2017  . Closed right hip fracture, initial encounter (HCC) 10/23/2017  . Closed subcapital fracture of right femur (HCC)   .  Adjustment reaction with aggression 05/14/2017  . Type 2 diabetes mellitus with other circulatory complications (HCC) 06/19/2015  . Thrombocytopenia (HCC) 06/19/2015  . Cerebral infarction due to unspecified mechanism   . Dilantin toxicity 06/18/2015  . Phenytoin toxicity   . Stroke (HCC) 06/16/2015  . Fall 06/16/2015  . Epilepsy (HCC) 06/16/2015  . BPH (benign prostatic hyperplasia) 06/16/2015  . Leukocytosis 06/16/2015  . CVA (cerebral infarction) 06/16/2015  . CVA (cerebral vascular accident) (HCC) 06/15/2015  . GERD (gastroesophageal reflux disease) 04/23/2014  . Potassium (K) deficiency 12/11/2012  . Precordial pain 12/11/2012  . Dehydration 04/15/2012  . AKI (acute kidney injury) (HCC) 04/14/2012  . HLD (hyperlipidemia) 06/01/2010  . Essential hypertension, benign 06/01/2010  . DYSPNEA 06/01/2010  . Diabetes mellitus without complication (HCC) 11/04/2009  . RECTAL BLEEDING 11/04/2009  . PERSONAL HX COLONIC POLYPS 11/04/2009    Past Surgical History:  Procedure Laterality Date  . CHOLECYSTECTOMY    . COLONOSCOPY  12/28/2009  . ESOPHAGOGASTRODUODENOSCOPY  10/14/2012   normal   . INGUINAL HERNIA REPAIR  06/27/11   left  . KNEE SURGERY  2008   Left  . LEFT HEART CATHETERIZATION WITH CORONARY ANGIOGRAM N/A 12/11/2012   Procedure: LEFT HEART CATHETERIZATION WITH CORONARY ANGIOGRAM;  Surgeon: Tonny Bollman, MD;  Location[facility] C[facility] LAB;  Service: Cardiovascular;  Laterality: N/A;  . TOTAL HIP ARTHROPLASTY Right  10/23/2017   Procedure: TOTAL HIP ARTHROPLASTY ANTERIOR APPROACH;  Surgeon: Kathryne Hitch, MD;  Location: [facility] OR;  Service: Orthopedics;  Laterality: Right;        Home Medications    Prior to Admission medications   Medication Sig Start Date End Date Taking? Authorizing Provider  amLODipine (NORVASC) 2.5 MG tablet Take 2.5 mg by mouth daily.   Yes [provider]  aspirin 325 MG tablet Take 325 mg by mouth daily.   Yes [provider]    carvedilol (COREG) 3.125 MG tablet Take 3.125 mg by mouth 2 (two) times daily with a meal.   Yes [provider]  Cholecalciferol (VITAMIN D3 PO) Take 1 tablet by mouth daily.   Yes [provider]  escitalopram (LEXAPRO) 10 MG tablet Take 10 mg by mouth daily.   Yes [provider]  galantamine (RAZADYNE ER) 16 MG 24 hr capsule Take 16 mg by mouth daily with breakfast.   Yes [provider]  levETIRAcetam (KEPPRA) 500 MG tablet Take 500 mg by mouth 2 (two) times daily.     Yes [provider]  lisinopril (PRINIVIL,ZESTRIL) 20 MG tablet Take 20 mg by mouth daily.   Yes [provider]  LORazepam (ATIVAN) 0.5 MG tablet Take 1 tablet (0.5 mg total) by mouth at bedtime. Patient taking differently: Take 0.25 mg by mouth at bedtime.  10/27/17  Yes Hongalgi, Maximino Greenland, MD  magnesium hydroxide (MILK OF MAGNESIA) 400 MG/5ML suspension Take 30 mLs by mouth at bedtime as needed for mild constipation.    Yes [provider]  magnesium oxide (MAG-OX) 400 MG tablet Take 400 mg by mouth daily.   Yes [provider]  Melatonin 3 MG TABS Take 3 mg by mouth at bedtime.   Yes [provider]  metFORMIN (GLUCOPHAGE) 500 MG tablet Take 500 mg by mouth 2 (two) times daily.    Yes [provider]  mirtazapine (REMERON) 30 MG tablet Take 30 mg by mouth at bedtime.    Yes [provider]  neomycin-bacitracin-polymyxin (NEOSPORIN) ointment Apply 1 application topically daily as needed for wound care.   Yes [provider]  pantoprazole (PROTONIX) 40 MG tablet Take 1 tablet (40 mg total) by mouth daily. 06/22/15  Yes Tat, Onalee Hua, MD  phenytoin (DILANTIN) 100 MG ER capsule Take 200 mg by mouth daily.   Yes [provider]  risperiDONE (RISPERDAL) 2 MG tablet Take 2 mg by mouth at bedtime.    Yes [provider]  traMADol (ULTRAM) 50 MG tablet Take 1 tablet (50 mg total) by mouth every 6 (six) hours as  needed. Patient taking differently: Take 50 mg by mouth every 6 (six) hours as needed for moderate pain.  06/23/18  Yes Mesner, Barbara Cower, MD  phenytoin (DILANTIN) 50 MG tablet Chew 3 tablets (150 mg total) by mouth daily. Patient not taking: Reported on 06/01/2018 10/28/17   Elease Etienne, MD  traMADol (ULTRAM) 50 MG tablet Take 1 tablet (50 mg total) by mouth every 6 (six) hours as needed for moderate pain. Patient not taking: Reported on 11/01/2018 06/23/18   Mesner, Barbara Cower, MD    Family History Family History  Problem Relation Age of Onset  . Heart disease Mother   . Alzheimer's disease Father   . Breast cancer Sister   . Colon cancer Other        unknown  . Diabetes Sister     Social History Social History   Tobacco  Use  . Smoking status: Former Smoker    Types: Cigars    Last attempt to quit: 04/14/1986    Years since quitting: 32.5  . Smokeless tobacco: Never Used  . Tobacco comment: Quit cigars in the 1980s  Substance Use Topics  . Alcohol use: No  . Drug use: No     Allergies   Patient has no known allergies.   Review of Systems Review of Systems  Unable to perform ROS: Dementia  All other systems reviewed and are negative.    Physical Exam Updated Vital Signs BP 135/84   Pulse 72   Temp 98.1 F (36.7 C) (Oral)   Resp 14   SpO2 100%   Physical Exam Vitals signs and nursing note reviewed.  Constitutional:      Appearance: Normal appearance.  HENT:     Head: Normocephalic.     Comments: Ecchymosis R periorbital area (old from previous)    Mouth/Throat:     Mouth: Mucous membranes are moist.  Eyes:     Extraocular Movements: Extraocular movements intact.     Pupils: Pupils are equal, round, and reactive to light.  Neck:     Musculoskeletal: Normal range of motion.  Cardiovascular:     Rate and Rhythm: Normal rate.  Pulmonary:     Effort: Pulmonary effort is normal.     Breath sounds: Normal breath sounds.  Abdominal:     General: Abdomen is  flat.  Musculoskeletal: Normal range of motion.  Skin:    General: Skin is warm.     Capillary Refill: Capillary refill takes less than 2 seconds.  Neurological:     General: No focal deficit present.     Mental Status: He is alert.     Comments: Demented, A & O x 1. Moving all extremities   Psychiatric:     Comments: Unable       ED Treatments / Results  Labs (all labs ordered are listed, but only abnormal results are displayed) Labs Reviewed  CBC WITH DIFFERENTIAL/PLATELET - Abnormal; Notable for the following components:      Result Value   WBC 11.2 (*)    RBC 4.05 (*)    Hemoglobin 12.7 (*)    Eosinophils Absolute 0.9 (*)    All other components within normal limits  COMPREHENSIVE METABOLIC PANEL - Abnormal; Notable for the following components:   Sodium 151 (*)    Chloride 113 (*)    Glucose, Bld 425 (*)    BUN 34 (*)    AST 12 (*)    Alkaline Phosphatase 162 (*)    GFR calc non Af Amer 58 (*)    All other components within normal limits  PHENYTOIN LEVEL, TOTAL - Abnormal; Notable for the following components:   Phenytoin Lvl 8.9 (*)    All other components within normal limits  CBG MONITORING, ED - Abnormal; Notable for the following components:   Glucose-Capillary 374 (*)    All other components within normal limits  CBG MONITORING, ED - Abnormal; Notable for the following components:   Glucose-Capillary 323 (*)    All other components within normal limits  URINALYSIS, ROUTINE W REFLEX MICROSCOPIC  I-STAT TROPONIN, ED    EKG EKG Interpretation  Date/Time:  Friday November 01 2018 10:36:43 EST Ventricular Rate:  70 PR Interval:    QRS Duration: 88 QT Interval:  412 QTC Calculation: 445 R Axis:   53 Text Interpretation:  Sinus rhythm Low voltage, precordial leads Borderline T  abnormalities, diffuse leads No significant change since last tracing Confirmed by Richardean Canal ([id number]) on 11/01/2018 10:59:13 AM   Radiology Dg Chest 1 View  Result Date:  11/01/2018 CLINICAL DATA:  Altered mental status, hypertension EXAM: CHEST  1 VIEW COMPARISON:  06/01/2018 FINDINGS: Heart and mediastinal contours are within normal limits. No focal opacities or effusions. No acute bony abnormality. IMPRESSION: No active disease. Electronically Signed   By: Charlett Nose M.D.   On: 11/01/2018 11:06   Ct Head Wo Contrast  Result Date: 11/01/2018 CLINICAL DATA:  Altered level of consciousness. Hyperglycemia. History of dementia. EXAM: CT HEAD WITHOUT CONTRAST TECHNIQUE: Contiguous axial images were obtained from the base of the skull through the vertex without intravenous contrast. COMPARISON:  10/22/2018 FINDINGS: Brain: Trace hyperdense blood products are present in the occipital horns of the lateral ventricles, new from the prior study. There may be a 3 mm focus of petechial hemorrhage in the left frontal lobe along the lateral margin of the cyst. No intracranial hemorrhage is identified elsewhere. Congenital brain anomalies are again noted including agenesis of the corpus callosum, colpocephaly, and an interhemispheric cyst centered left of midline. The ventricles are unchanged in size and configuration. Patchy cerebral white matter hypodensities are unchanged and nonspecific but may reflect mild chronic small vessel ischemic disease. There is mild cerebellar atrophy. No acute infarct or extra-axial fluid collection is identified. Vascular: Calcified atherosclerosis at the skull base. No hyperdense vessel. Skull: No fracture or focal osseous lesion. Sinuses/Orbits: Minimal mucosal thickening in the ethmoid air cells. Clear mastoid air cells. Unremarkable orbits. Other: Decreased size of right frontal scalp hematoma. IMPRESSION: 1. Trace intraventricular hemorrhage. 2. Possible 3 mm petechial hemorrhage in the left frontal lobe. 3. Decreased size of frontal scalp hematoma. Critical Value/emergent results were called by telephone at the time of interpretation on 11/01/2018 at  1:22 pm to Dr. Chaney Malling , who verbally acknowledged these results. Electronically Signed   By: Sebastian Ache M.D.   On: 11/01/2018 13:22    Procedures Procedures (including critical care time)  CRITICAL CARE Performed by: Richardean Canal   Total critical care time: 30 minutes  Critical care time was exclusive of separately billable procedures and treating other patients.  Critical care was necessary to treat or prevent imminent or life-threatening deterioration.  Critical care was time spent personally by me on the following activities: development of treatment plan with patient and/or surrogate as well as nursing, discussions with consultants, evaluation of patient's response to treatment, examination of patient, obtaining history from patient or surrogate, ordering and performing treatments and interventions, ordering and review of laboratory studies, ordering and review of radiographic studies, pulse oximetry and re-evaluation of patient's condition.   Medications Ordered in ED Medications  sodium chloride 0.9 % bolus 1,000 mL (has no administration in time range)  sodium chloride 0.9 % bolus 1,000 mL (0 mLs Intravenous Stopped 11/01/18 1243)  insulin aspart (novoLOG) injection 5 Units (5 Units Subcutaneous Given 11/01/18 1129)     Initial Impression / Assessment and Plan / ED Course  I have reviewed the triage vital signs and the nursing notes.  Pertinent labs & imaging results that were available during my care of the patient were reviewed by me and considered in my medical decision making (see chart for details).    SYED ZUKAS is a 72 y.o. male here with weakness, hyperglycemia. Poor PO intake at the facility. Appears dehydrated and CBG is 500 per EMS. Also had recent head  injury. Will get labs, UA, CXR, CT head. Will hydrate and reassess.   1:43 PM Patient's CT showed 3 mm petechial hemorrhage. Unclear if there is new falls. I talked to Dr. Wynetta Emeryram from neurosurgery, he  doesn't think its clinically significant. He has a lot of congenital changes and had recent fall. Dr. Wynetta Emeryram doesn't recommend repeat head CT or any neurosurgery eval. He is hypernatremic and hyperglycemic likely from not eating. Given IVF and insulin. Will admit for observation.     Final Clinical Impressions(s) / ED Diagnoses   Final diagnoses:  None    ED Discharge Orders    None       Charlynne PanderYao, Lilliona Blakeney Hsienta, MD 11/01/18 1408

## 2018-11-02 DIAGNOSIS — F419 Anxiety disorder, unspecified: Secondary | ICD-10-CM | POA: Diagnosis not present

## 2018-11-02 DIAGNOSIS — F0390 Unspecified dementia without behavioral disturbance: Secondary | ICD-10-CM | POA: Diagnosis present

## 2018-11-02 DIAGNOSIS — E1159 Type 2 diabetes mellitus with other circulatory complications: Secondary | ICD-10-CM | POA: Diagnosis present

## 2018-11-02 DIAGNOSIS — N390 Urinary tract infection, site not specified: Secondary | ICD-10-CM | POA: Diagnosis present

## 2018-11-02 DIAGNOSIS — F39 Unspecified mood [affective] disorder: Secondary | ICD-10-CM | POA: Diagnosis present

## 2018-11-02 DIAGNOSIS — E1122 Type 2 diabetes mellitus with diabetic chronic kidney disease: Secondary | ICD-10-CM | POA: Diagnosis present

## 2018-11-02 DIAGNOSIS — E1165 Type 2 diabetes mellitus with hyperglycemia: Secondary | ICD-10-CM | POA: Diagnosis present

## 2018-11-02 DIAGNOSIS — I10 Essential (primary) hypertension: Secondary | ICD-10-CM | POA: Diagnosis not present

## 2018-11-02 DIAGNOSIS — M6281 Muscle weakness (generalized): Secondary | ICD-10-CM | POA: Diagnosis not present

## 2018-11-02 DIAGNOSIS — I129 Hypertensive chronic kidney disease with stage 1 through stage 4 chronic kidney disease, or unspecified chronic kidney disease: Secondary | ICD-10-CM | POA: Diagnosis present

## 2018-11-02 DIAGNOSIS — E860 Dehydration: Secondary | ICD-10-CM | POA: Diagnosis present

## 2018-11-02 DIAGNOSIS — G459 Transient cerebral ischemic attack, unspecified: Secondary | ICD-10-CM | POA: Diagnosis not present

## 2018-11-02 DIAGNOSIS — E876 Hypokalemia: Secondary | ICD-10-CM | POA: Diagnosis present

## 2018-11-02 DIAGNOSIS — F329 Major depressive disorder, single episode, unspecified: Secondary | ICD-10-CM | POA: Diagnosis not present

## 2018-11-02 DIAGNOSIS — I639 Cerebral infarction, unspecified: Secondary | ICD-10-CM | POA: Diagnosis not present

## 2018-11-02 DIAGNOSIS — R531 Weakness: Secondary | ICD-10-CM | POA: Diagnosis not present

## 2018-11-02 DIAGNOSIS — E870 Hyperosmolality and hypernatremia: Secondary | ICD-10-CM | POA: Diagnosis present

## 2018-11-02 DIAGNOSIS — I2510 Atherosclerotic heart disease of native coronary artery without angina pectoris: Secondary | ICD-10-CM | POA: Diagnosis present

## 2018-11-02 DIAGNOSIS — F79 Unspecified intellectual disabilities: Secondary | ICD-10-CM | POA: Diagnosis present

## 2018-11-02 DIAGNOSIS — R2681 Unsteadiness on feet: Secondary | ICD-10-CM | POA: Diagnosis not present

## 2018-11-02 DIAGNOSIS — G309 Alzheimer's disease, unspecified: Secondary | ICD-10-CM | POA: Diagnosis not present

## 2018-11-02 DIAGNOSIS — I629 Nontraumatic intracranial hemorrhage, unspecified: Secondary | ICD-10-CM | POA: Diagnosis not present

## 2018-11-02 DIAGNOSIS — R279 Unspecified lack of coordination: Secondary | ICD-10-CM | POA: Diagnosis not present

## 2018-11-02 DIAGNOSIS — G4700 Insomnia, unspecified: Secondary | ICD-10-CM | POA: Diagnosis not present

## 2018-11-02 DIAGNOSIS — N400 Enlarged prostate without lower urinary tract symptoms: Secondary | ICD-10-CM | POA: Diagnosis present

## 2018-11-02 DIAGNOSIS — Z743 Need for continuous supervision: Secondary | ICD-10-CM | POA: Diagnosis not present

## 2018-11-02 DIAGNOSIS — W19XXXD Unspecified fall, subsequent encounter: Secondary | ICD-10-CM

## 2018-11-02 DIAGNOSIS — Z7982 Long term (current) use of aspirin: Secondary | ICD-10-CM | POA: Diagnosis not present

## 2018-11-02 DIAGNOSIS — R296 Repeated falls: Secondary | ICD-10-CM | POA: Diagnosis present

## 2018-11-02 DIAGNOSIS — D649 Anemia, unspecified: Secondary | ICD-10-CM | POA: Diagnosis present

## 2018-11-02 DIAGNOSIS — N189 Chronic kidney disease, unspecified: Secondary | ICD-10-CM | POA: Diagnosis present

## 2018-11-02 DIAGNOSIS — B964 Proteus (mirabilis) (morganii) as the cause of diseases classified elsewhere: Secondary | ICD-10-CM | POA: Diagnosis present

## 2018-11-02 DIAGNOSIS — R55 Syncope and collapse: Secondary | ICD-10-CM | POA: Diagnosis not present

## 2018-11-02 DIAGNOSIS — I615 Nontraumatic intracerebral hemorrhage, intraventricular: Secondary | ICD-10-CM | POA: Diagnosis present

## 2018-11-02 DIAGNOSIS — R29703 NIHSS score 3: Secondary | ICD-10-CM | POA: Diagnosis present

## 2018-11-02 DIAGNOSIS — F323 Major depressive disorder, single episode, severe with psychotic features: Secondary | ICD-10-CM | POA: Diagnosis not present

## 2018-11-02 DIAGNOSIS — Z9181 History of falling: Secondary | ICD-10-CM | POA: Diagnosis not present

## 2018-11-02 DIAGNOSIS — K5900 Constipation, unspecified: Secondary | ICD-10-CM | POA: Diagnosis not present

## 2018-11-02 DIAGNOSIS — R293 Abnormal posture: Secondary | ICD-10-CM | POA: Diagnosis not present

## 2018-11-02 DIAGNOSIS — E785 Hyperlipidemia, unspecified: Secondary | ICD-10-CM | POA: Diagnosis present

## 2018-11-02 DIAGNOSIS — I6349 Cerebral infarction due to embolism of other cerebral artery: Secondary | ICD-10-CM | POA: Diagnosis not present

## 2018-11-02 DIAGNOSIS — G40909 Epilepsy, unspecified, not intractable, without status epilepticus: Secondary | ICD-10-CM | POA: Diagnosis present

## 2018-11-02 DIAGNOSIS — G9341 Metabolic encephalopathy: Secondary | ICD-10-CM | POA: Diagnosis present

## 2018-11-02 DIAGNOSIS — I6612 Occlusion and stenosis of left anterior cerebral artery: Secondary | ICD-10-CM | POA: Diagnosis not present

## 2018-11-02 DIAGNOSIS — K219 Gastro-esophageal reflux disease without esophagitis: Secondary | ICD-10-CM | POA: Diagnosis not present

## 2018-11-02 DIAGNOSIS — R41841 Cognitive communication deficit: Secondary | ICD-10-CM | POA: Diagnosis not present

## 2018-11-02 DIAGNOSIS — R278 Other lack of coordination: Secondary | ICD-10-CM | POA: Diagnosis not present

## 2018-11-02 DIAGNOSIS — F0281 Dementia in other diseases classified elsewhere with behavioral disturbance: Secondary | ICD-10-CM | POA: Diagnosis not present

## 2018-11-02 DIAGNOSIS — E871 Hypo-osmolality and hyponatremia: Secondary | ICD-10-CM | POA: Diagnosis present

## 2018-11-02 LAB — GLUCOSE, CAPILLARY
GLUCOSE-CAPILLARY: 198 mg/dL — AB (ref 70–99)
Glucose-Capillary: 179 mg/dL — ABNORMAL HIGH (ref 70–99)
Glucose-Capillary: 203 mg/dL — ABNORMAL HIGH (ref 70–99)
Glucose-Capillary: 231 mg/dL — ABNORMAL HIGH (ref 70–99)

## 2018-11-02 LAB — BASIC METABOLIC PANEL
Anion gap: 13 (ref 5–15)
BUN: 23 mg/dL (ref 8–23)
CO2: 25 mmol/L (ref 22–32)
Calcium: 8.5 mg/dL — ABNORMAL LOW (ref 8.9–10.3)
Chloride: 109 mmol/L (ref 98–111)
Creatinine, Ser: 1.08 mg/dL (ref 0.61–1.24)
GFR calc Af Amer: >60 mL/min (ref >60)
GFR calc non Af Amer: >60 mL/min (ref >60)
Glucose, Bld: 190 mg/dL — ABNORMAL HIGH (ref 70–99)
Potassium: 3.4 mmol/L — ABNORMAL LOW (ref 3.5–5.1)
Sodium: 147 mmol/L — ABNORMAL HIGH (ref 135–145)

## 2018-11-02 LAB — CBC
HCT: 34.2 % — ABNORMAL LOW (ref 39.0–52.0)
Hemoglobin: 11.4 g/dL — ABNORMAL LOW (ref 13.0–17.0)
MCH: 32.6 pg (ref 26.0–34.0)
MCHC: 33.3 g/dL (ref 30.0–36.0)
MCV: 97.7 fL (ref 80.0–100.0)
Platelets: 137 10*3/uL — ABNORMAL LOW (ref 150–400)
RBC: 3.5 MIL/uL — ABNORMAL LOW (ref 4.22–5.81)
RDW: 12.6 % (ref 11.5–15.5)
WBC: 12.4 10*3/uL — ABNORMAL HIGH (ref 4.0–10.5)
nRBC: 0 % (ref 0.0–0.2)

## 2018-11-02 MED ORDER — POTASSIUM CHLORIDE CRYS ER 20 MEQ PO TBCR
40.0000 meq | EXTENDED_RELEASE_TABLET | Freq: Once | ORAL | Status: AC
  Administered 2018-11-02: 40 meq via ORAL
  Filled 2018-11-02: qty 2

## 2018-11-02 NOTE — Progress Notes (Signed)
Pt 's POA sister Woody SellerSharron McIntye called wanted updates and had some concerns about being discharged today since he has had 3 falls there.  And wanted updates.  Paged Dr. Alvino Chapelhoi and she stated she would give her a call a little later.

## 2018-11-02 NOTE — Consult Note (Addendum)
NEURO HOSPITALIST  CONSULT   Requesting Physician: Dr. Alvino Chapel    Chief Complaint: AMS  History obtained from:  Chart   HPI:                                                                                                                                         Arthur Berry is an 72 y.o. male  With PMH, CKD, CAD, DM, CVA (06/2015) , epilepsy ( keppra, dilantin) who lives in [facility] who presented to [facility] ED for AMS. Neuro consulted for infarcts on MRI   Patient had a recent fall 10/22/18, and head CT showed no hemorrhage. Suture was placed. He was sent to ED by nursing home for AMS and BG 500. Patient had not been eating well. Uses a walker at baseline to ambulate.   ED course:  BP: 166/82 BG: 374 ( on arrival) CTH: trace IVH hemorrhage, possible 3 mm petechial hemorrhage in left frontal lobe. MRI: 2 cm diffusion abnormality involving left frontoparietal cortex at the vertex which may reflect an acute/early subacute infarct or possibly contusion. Possible additional punctate acute to early subacute infarcts in the left frontal lobe and left insula.Trace intraventricular hemorrhage.  06/2015: punctate left MCA/PCA stroke. Patient had ha seizures since 72 year old. Currently on dilantin and keppra ( good control). Dr. Marjory Lies is outpatient neurologist. Last seen 08/14/17.  Date last known well: Date: 11/01/2018 Time last known well: Unable to determine tPA Given: No: outside of window  Modified Rankin: Rankin Score=2 NIHSS:3    Past Medical History:  Diagnosis Date  . Adjustment reaction with aggression 05/14/2017  . Arthritis   . Chronic kidney disease   . Colon polyp 2005   Tubular Adenoma  . Coronary artery disease, non-occlusive    Minimal nonobstructive CAD, nl LV systolic fxn by cath 12/11/12  . Diabetes mellitus 2001   TYPE 2  . Diverticulosis   . Epilepsy (HCC)   . GERD (gastroesophageal reflux disease) 04/23/2014  .  Goiter, nontoxic, multinodular    Thyroid US 11/2012  . Hyperlipidemia 1999  . Hypertension 1999  . Internal hemorrhoids   . Neuromuscular disorder (HCC)   . OSA (obstructive sleep apnea)   . Prostate enlargement   . Sleep apnea   . Splinter 04/27/13   metal plinter removed rt pointer finger  . Stroke (HCC) 06/2015  . Vision changes 06/2015   since CVA    Past Surgical History:  Procedure Laterality Date  . CHOLECYSTECTOMY    . COLONOSCOPY  12/28/2009  . ESOPHAGOGASTRODUODENOSCOPY  10/14/2012   normal   . INGUINAL HERNIA REPAIR  06/27/11  left  . KNEE SURGERY  2008   Left  . LEFT HEART CATHETERIZATION WITH CORONARY ANGIOGRAM N/A 12/11/2012   Procedure: LEFT HEART CATHETERIZATION WITH CORONARY ANGIOGRAM;  Surgeon: Tonny Bollman, MD;  Location: [facility] CATH LAB;  Service: Cardiovascular;  Laterality: N/A;  . TOTAL HIP ARTHROPLASTY Right 10/23/2017   Procedure: TOTAL HIP ARTHROPLASTY ANTERIOR APPROACH;  Surgeon: Kathryne Hitch, MD;  Location: [facility] OR;  Service: Orthopedics;  Laterality: Right;    Family History  Problem Relation Age of Onset  . Heart disease Mother   . Alzheimer's disease Father   . Breast cancer Sister   . Colon cancer Other        unknown  . Diabetes Sister        Social History:  reports that he quit smoking about 32 years ago. His smoking use included cigars. He has never used smokeless tobacco. He reports that he does not drink alcohol or use drugs.  Allergies: No Known Allergies  Medications:                                                                                                                           Scheduled: . amLODipine  2.5 mg Oral Daily  . carvedilol  3.125 mg Oral BID WC  . escitalopram  10 mg Oral Daily  . galantamine  16 mg Oral Q breakfast  . glipiZIDE  5 mg Oral BID AC  . insulin aspart  0-15 Units Subcutaneous TID WC  . insulin aspart  0-5 Units Subcutaneous QHS  . levETIRAcetam  500 mg Oral BID  . lisinopril  20 mg Oral  Daily  . LORazepam  0.25 mg Oral QHS  . magnesium oxide  400 mg Oral Daily  . Melatonin  3 mg Oral QHS  . mirtazapine  30 mg Oral QHS  . pantoprazole  40 mg Oral Daily  . phenytoin  200 mg Oral Daily  . risperiDONE  2 mg Oral QHS   Continuous: . sodium chloride 100 mL/hr at 11/02/18 0601   ZOX:[id number] **OR** acetaminophen, magnesium hydroxide, neomycin-bacitracin-polymyxin, ondansetron **OR** ondansetron (ZOFRAN) IV, traMADol   ROS:                                                                                                                                         unobtainable from patient due to mental status  General Examination:                                                                                                      Blood pressure (!) 141/82, pulse 66, temperature 99.2 F (37.3 C), temperature source Oral, resp. rate 18, height 5\' 8"  (1.727 m), weight 90.7 kg, SpO2 98 %.  HEENT-  Normocephalic, no lesions, without obvious abnormality.  Normal external eye and conjunctiva. Cardiovascular- S1-S2 audible, pulses palpable throughout  Lungs-no rhonchi or wheezing noted, no excessive working breathing.  Saturations within normal limits on RA Abdomen- All 4 quadrants palpated and non tender Extremities- Warm, dry and intact Musculoskeletal-no joint tenderness, deformity or swelling Skin-warm and dry, no hyperpigmentation, vitiligo, or suspicious lesions  Neurological Examination Mental Status: Asleep, easy to arouse. oriented to name only/ did not know age/month/year., some confusion persist.  Speech fluent without evidence of aphasia.  Slight dysarthria. Able to follow some commands without difficulty. Patient lacks attention and does not cooperate well.  Cranial Nerves: II: left homonymous hemaniopsia,  III,IV, VI: ptosis not present, extra-ocular motions intact bilaterally, pupils equal, round, reactive to light and accommodation V,VII: smile symmetric,  facial light touch sensation normal bilaterally VIII: hearing normal bilaterally IX,X: uvula rises midline XI: bilateral shoulder shrug XII: midline tongue extension Motor: Right : Upper extremity   4/5  Left:     Upper extremity   4/5  Lower extremity   4/5   Lower extremity   4/5 Weak hand grips, but weaker on right than left.  RLE with increased tone. Sensory: light touch intact throughout, bilaterally Deep Tendon Reflexes: 2+ and symmetric biceps and patella Plantars: Right: downgoing   Left: downgoing Cerebellar: Unable to test d/t cooperation Gait: deferred   Lab Results: Basic Metabolic Panel: Recent Labs  Lab 11/01/18 1036 11/02/18 0507  NA 151* 147*  K 4.0 3.4*  CL 113* 109  CO2 26 25  GLUCOSE 425* 190*  BUN 34* 23  CREATININE 1.23 1.08  CALCIUM 9.7 8.5*    CBC: Recent Labs  Lab 11/01/18 1036 11/02/18 0507  WBC 11.2* 12.4*  NEUTROABS 7.7  --   HGB 12.7* 11.4*  HCT 40.5 34.2*  MCV 100.0 97.7  PLT 150 137*    Lipid Panel: No results for input(s): CHOL, TRIG, HDL, CHOLHDL, VLDL, LDLCALC in the last 168 hours.  CBG: Recent Labs  Lab 11/01/18 1242 11/01/18 1607 11/01/18 1654 11/01/18 2202 11/02/18 0826  GLUCAP 323* 219* 206* 156* 179*    Imaging: Dg Chest 1 View  Result Date: 11/01/2018 CLINICAL DATA:  Altered mental status, hypertension EXAM: CHEST  1 VIEW COMPARISON:  06/01/2018 FINDINGS: Heart and mediastinal contours are within normal limits. No focal opacities or effusions. No acute bony abnormality. IMPRESSION: No active disease. Electronically Signed   By: Charlett Nose M.D.   On: 11/01/2018 11:06   Ct Head Wo Contrast  Result Date: 11/01/2018 CLINICAL DATA:  Altered level of consciousness. Hyperglycemia. History of dementia. EXAM: CT HEAD WITHOUT CONTRAST TECHNIQUE: Contiguous axial images were obtained from the base of the skull through the  vertex without intravenous contrast. COMPARISON:  10/22/2018 FINDINGS: Brain: Trace hyperdense  blood products are present in the occipital horns of the lateral ventricles, new from the prior study. There may be a 3 mm focus of petechial hemorrhage in the left frontal lobe along the lateral margin of the cyst. No intracranial hemorrhage is identified elsewhere. Congenital brain anomalies are again noted including agenesis of the corpus callosum, colpocephaly, and an interhemispheric cyst centered left of midline. The ventricles are unchanged in size and configuration. Patchy cerebral white matter hypodensities are unchanged and nonspecific but may reflect mild chronic small vessel ischemic disease. There is mild cerebellar atrophy. No acute infarct or extra-axial fluid collection is identified. Vascular: Calcified atherosclerosis at the skull base. No hyperdense vessel. Skull: No fracture or focal osseous lesion. Sinuses/Orbits: Minimal mucosal thickening in the ethmoid air cells. Clear mastoid air cells. Unremarkable orbits. Other: Decreased size of right frontal scalp hematoma. IMPRESSION: 1. Trace intraventricular hemorrhage. 2. Possible 3 mm petechial hemorrhage in the left frontal lobe. 3. Decreased size of frontal scalp hematoma. Critical Value/emergent results were called by telephone at the time of interpretation on 11/01/2018 at 1:22 pm to Dr. Chaney MallingAVID YAO , who verbally acknowledged these results. Electronically Signed   By: Sebastian AcheAllen  Grady M.D.   On: 11/01/2018 13:22   [name]  Result Date: 11/01/2018 CLINICAL DATA:  Altered mental status. Hyperglycemia. History of dementia. Fall 10 days ago. EXAM: MRI HEAD WITHOUT CONTRAST TECHNIQUE: Multiplanar, multiecho pulse sequences of the brain and surrounding structures were obtained without intravenous contrast. COMPARISON:  Head CT 11/01/2018 and MRI 06/16/2015 FINDINGS: The patient was unable to tolerate the complete examination. Axial and coronal diffusion, sagittal T1, and axial T2 sequences were obtained. The axial T2 sequence is  moderately motion degraded, and there is mild motion on the diffusion imaging. Brain: There is a 2 cm region of abnormal trace diffusion signal with normal to mildly reduced ADC involving left frontoparietal cortex at the vertex suggesting an acute to early subacute infarct. Two additional punctate foci of abnormal diffusion signal are noted in the left frontal lobe along the margin of the interhemispheric cyst and in the left insula and may also represent recent tiny infarcts although diffusion signal in the left frontal lobe corresponds to the questioned petechial hemorrhage on CT and could be artifactual. Minimal signal abnormality dependently in the occipital horns of both lateral ventricles corresponds to the trace hemorrhage reported on earlier CT. Chronic changes including agenesis of the corpus callosum, a large inter hemispheric cyst extending left of midline, and cerebellar atrophy are again noted. There is colpocephaly with unchanged size and configuration of the lateral ventricles. Chronic cerebral white matter disease is incompletely evaluated. Vascular: Major intracranial arterial flow voids are grossly preserved. T2 hyperintensity in the non dominant left sigmoid sinus may reflect slow flow or less likely occlusion. Skull and upper cervical spine: No suspicious marrow lesion. Sinuses/Orbits: Grossly unremarkable orbits.  Clear sinuses. Other: Small right frontal scalp hematoma. IMPRESSION: 1. Incomplete, motion degraded examination. 2. 2 cm diffusion abnormality involving left frontoparietal cortex at the vertex which may reflect an acute/early subacute infarct or possibly contusion. 3. Possible additional punctate acute to early subacute infarcts in the left frontal lobe and left insula. 4. Trace intraventricular hemorrhage. 5. Chronic changes as above. Electronically Signed   By: Sebastian AcheAllen  Grady M.D.   On: 11/01/2018 14:10    Valentina LucksJessica Williams, MSN, NP-C [facility] [phone]  11/02/2018, 11:11 AM   Attending physician  note to follow with Assessment and plan .   Assessment: 72 y.o. male With PMH, CKD, CAD, DM, CVA (06/2015) , epilepsy ( keppra, dilantin) who lives in [facility] who [facility]. Neuro consulted for infarcts on MRI. CTH:  trace IVH hemorrhage, possible 3 mm petechial hemorrhage in left frontal lobe. MRI: 2 cm diffusion abnormality involving left frontoparietal cortex at the vertex which may reflect an acute/early subacute infarct or possibly contusion. Possible additional punctate acute to early subacute infarcts in the left frontal lobe and left insula.Trace intraventricular hemorrhage. further stroke work-up needed.   Stroke Risk Factors - diabetes mellitus    Recommendations: --MRI Brain (completed) --Telemetry monitoring --Frequent neuro checks -- Hold ASA -- previously had stroke workup, - repeat Echo -- Pt/OT  --please page stroke NP  Or  PA  Or MD from 8am -4 pm  as this patient from this time will be  followed by the stroke.   You can look them up on [email or web address]  Password [email or web address]    NEUROHOSPITALIST ADDENDUM Performed a face to face diagnostic evaluation.   I have reviewed the contents of history and physical exam as documented by PA/ARNP/Resident and agree with above documentation.  I have discussed and formulated the above plan as documented. Edits to the note have been made as needed.  [age] y m with PMH of embolic infarcts of undermined source presents with AMS. Has trace IVH and punctate infarcts- likely embolic vs end vessel ischemia from HTN.  He has a poor baseline from dementia. I think we should hold ASA and limited stroke workup.   Stroke team to follow.     Georgiana SpinnerSushanth Aroor MD [facility] [phone]   If 7pm to 7am, please call on call as listed on AMION.

## 2018-11-02 NOTE — Progress Notes (Signed)
PROGRESS NOTE    Arthur BurySamuel W Berry  HQI:[id number] DOB: 1946/01/08 DOA: 11/01/2018 PCP: Rodrigo RanPerini, Mark, MD     Brief Narrative:  Arthur Berry is a 72 y.o. male with past medical history significant for slight confusion and underlying dementia, CKD, CAD, diabetes mellitus, history of epilepsy, stroke who lives at [facility].  He had a recent head injury 12/10 from fall and had CT head that showed no intracranial bleeding and had suture placed.  He was again sent from nursing home due to uncontrolled blood sugar in 500, worsening of his mental status, "patient was not being himself".  Apparently patient has not been eating well.  There was no reported recent falls since the last one.  Imaging in the emergency department revealed trace intraventricular hemorrhage, acute/subacute infarct.  New events last 24 hours / Subjective: No acute events overnight.  He states that he is tired, does not want to engage in physical exam.  Follows some commands, but not all due to lack of motivation.  Spoke with sister over the phone, sister is very concerned regarding skilled nursing facility, looking into different options.  Assessment & Plan:   Active Problems:   HLD (hyperlipidemia)   Dehydration   Stroke (HCC)   Fall   Type 2 diabetes mellitus with other circulatory complications (HCC)   Hypernatremia   Acute encephalopathy Appears to be back to his baseline  Recurrent falls PT OT  Questionable stroke MRI brain showed 2cm diffuse abnormality left frontoparietal cortex possible acute/early subacute infarct or possibly contusion, possible punctate acute/subacute infarct left frontal lob and left insula  Neurology consulted  Intraventricular hemorrhage On-call neurosurgeon does not feel it is clinically significant, had no other neurosurgical interventions offered at this time  Hypernatremia Suspect secondary to dehydration, poor oral intake Continue half-normal saline Improving, continue to  monitor BMP  Diabetes mellitus type 2 with uncontrolled hyperglycemia Hemoglobin A1c 9.2  Continue glipizide SSI   HTN Continue norvasc, coreg, lisinopril   History of epilepsy Continue Dilantin, Keppra  Mood disorder Continue Lexapro, galantamine, Ativan, Remeron, Risperdal  Hypokalemia Replace, trend      DVT prophylaxis: SCD Code Status: Full Family Communication: Sister over the phone Disposition Plan: Pending Neurology work up, SNF placement   Consultants:   Neurology  Procedures:   None   Antimicrobials:  Anti-infectives (From admission, onward)   None        Objective: Vitals:   11/01/18 2104 11/01/18 2200 11/02/18 0400 11/02/18 0826  BP: (!) 147/73  137/83 (!) 141/82  Pulse: 75  67 66  Resp: 18  18 18   Temp: 98.8 F (37.1 C)  98.5 F (36.9 C) 99.2 F (37.3 C)  TempSrc: Oral  Oral Oral  SpO2: 100%  97% 98%  Weight:  90.7 kg    Height:  5\' 8"  (1.727 m)      Intake/Output Summary (Last 24 hours) at 11/02/2018 1427 Last data filed at 11/02/2018 1200 Gross per 24 hour  Intake 4476.67 ml  Output 1125 ml  Net 3351.67 ml   Filed Weights   11/01/18 2200  Weight: 90.7 kg    Examination:  General exam: Appears calm and comfortable  Respiratory system: Clear to auscultation. Respiratory effort normal. Cardiovascular system: S1 & S2 heard, RRR. No JVD, murmurs, rubs, gallops or clicks. No pedal edema. Gastrointestinal system: Abdomen is nondistended, soft and nontender. No organomegaly or masses felt. Normal bowel sounds heard. Central nervous system: Alert and oriented. Moves all extremities, does not follow  all commands to test full neuro exam  Extremities: Symmetric  Skin: No rashes, lesions or ulcers Psychiatry: Judgement and insight appear poor  Data Reviewed: I have personally reviewed following labs and imaging studies  CBC: Recent Labs  Lab 11/01/18 1036 11/02/18 0507  WBC 11.2* 12.4*  NEUTROABS 7.7  --   HGB 12.7* 11.4*    HCT 40.5 34.2*  MCV 100.0 97.7  PLT 150 137*   Basic Metabolic Panel: Recent Labs  Lab 11/01/18 1036 11/02/18 0507  NA 151* 147*  K 4.0 3.4*  CL 113* 109  CO2 26 25  GLUCOSE 425* 190*  BUN 34* 23  CREATININE 1.23 1.08  CALCIUM 9.7 8.5*   GFR: Estimated Creatinine Clearance: 67.6 mL/min (by C-G formula based on SCr of 1.08 mg/dL). Liver Function Tests: Recent Labs  Lab 11/01/18 1036  AST 12*  ALT 16  ALKPHOS 162*  BILITOT 0.6  PROT 7.0  ALBUMIN 3.7   No results for input(s): LIPASE, AMYLASE in the last 168 hours. No results for input(s): AMMONIA in the last 168 hours. Coagulation Profile: No results for input(s): INR, PROTIME in the last 168 hours. Cardiac Enzymes: No results for input(s): CKTOTAL, CKMB, CKMBINDEX, TROPONINI in the last 168 hours. BNP (last 3 results) No results for input(s): PROBNP in the last 8760 hours. HbA1C: Recent Labs    11/01/18 1649  HGBA1C 9.2*   CBG: Recent Labs  Lab 11/01/18 1607 11/01/18 1654 11/01/18 2202 11/02/18 0826 11/02/18 1142  GLUCAP 219* 206* 156* 179* 198*   Lipid Profile: No results for input(s): CHOL, HDL, LDLCALC, TRIG, CHOLHDL, LDLDIRECT in the last 72 hours. Thyroid Function Tests: No results for input(s): TSH, T4TOTAL, FREET4, T3FREE, THYROIDAB in the last 72 hours. Anemia Panel: No results for input(s): VITAMINB12, FOLATE, FERRITIN, TIBC, IRON, RETICCTPCT in the last 72 hours. Sepsis Labs: No results for input(s): PROCALCITON, LATICACIDVEN in the last 168 hours.  Recent Results (from the past 240 hour(s))  MRSA PCR Screening     Status: None   Collection Time: 11/01/18  5:15 PM  Result Value Ref Range Status   MRSA by PCR NEGATIVE NEGATIVE Final    Comment:        The GeneXpert MRSA Assay (FDA approved for NASAL specimens only), is one component of a comprehensive MRSA colonization surveillance program. It is not intended to diagnose MRSA infection nor to guide or monitor treatment for MRSA  infections. Performed at [facility], [address] [place], [place] [address]        Radiology Studies: Dg Chest 1 View  Result Date: 11/01/2018 CLINICAL DATA:  Altered mental status, hypertension EXAM: CHEST  1 VIEW COMPARISON:  06/01/2018 FINDINGS: Heart and mediastinal contours are within normal limits. No focal opacities or effusions. No acute bony abnormality. IMPRESSION: No active disease. Electronically Signed   By: Charlett Nose M.D.   On: 11/01/2018 11:06   Ct Head Wo Contrast  Result Date: 11/01/2018 CLINICAL DATA:  Altered level of consciousness. Hyperglycemia. History of dementia. EXAM: CT HEAD WITHOUT CONTRAST TECHNIQUE: Contiguous axial images were obtained from the base of the skull through the vertex without intravenous contrast. COMPARISON:  10/22/2018 FINDINGS: Brain: Trace hyperdense blood products are present in the occipital horns of the lateral ventricles, new from the prior study. There may be a 3 mm focus of petechial hemorrhage in the left frontal lobe along the lateral margin of the cyst. No intracranial hemorrhage is identified elsewhere. Congenital brain anomalies are again noted  including agenesis of the corpus callosum, colpocephaly, and an interhemispheric cyst centered left of midline. The ventricles are unchanged in size and configuration. Patchy cerebral white matter hypodensities are unchanged and nonspecific but may reflect mild chronic small vessel ischemic disease. There is mild cerebellar atrophy. No acute infarct or extra-axial fluid collection is identified. Vascular: Calcified atherosclerosis at the skull base. No hyperdense vessel. Skull: No fracture or focal osseous lesion. Sinuses/Orbits: Minimal mucosal thickening in the ethmoid air cells. Clear mastoid air cells. Unremarkable orbits. Other: Decreased size of right frontal scalp hematoma. IMPRESSION: 1. Trace intraventricular hemorrhage. 2. Possible 3 mm petechial hemorrhage in the left frontal  lobe. 3. Decreased size of frontal scalp hematoma. Critical Value/emergent results were called by telephone at the time of interpretation on 11/01/2018 at 1:22 pm to Dr. Chaney MallingAVID YAO , who verbally acknowledged these results. Electronically Signed   By: Sebastian AcheAllen  Grady M.D.   On: 11/01/2018 13:22   [name]  Result Date: 11/01/2018 CLINICAL DATA:  Altered mental status. Hyperglycemia. History of dementia. Fall 10 days ago. EXAM: MRI HEAD WITHOUT CONTRAST TECHNIQUE: Multiplanar, multiecho pulse sequences of the brain and surrounding structures were obtained without intravenous contrast. COMPARISON:  Head CT 11/01/2018 and MRI 06/16/2015 FINDINGS: The patient was unable to tolerate the complete examination. Axial and coronal diffusion, sagittal T1, and axial T2 sequences were obtained. The axial T2 sequence is moderately motion degraded, and there is mild motion on the diffusion imaging. Brain: There is a 2 cm region of abnormal trace diffusion signal with normal to mildly reduced ADC involving left frontoparietal cortex at the vertex suggesting an acute to early subacute infarct. Two additional punctate foci of abnormal diffusion signal are noted in the left frontal lobe along the margin of the interhemispheric cyst and in the left insula and may also represent recent tiny infarcts although diffusion signal in the left frontal lobe corresponds to the questioned petechial hemorrhage on CT and could be artifactual. Minimal signal abnormality dependently in the occipital horns of both lateral ventricles corresponds to the trace hemorrhage reported on earlier CT. Chronic changes including agenesis of the corpus callosum, a large inter hemispheric cyst extending left of midline, and cerebellar atrophy are again noted. There is colpocephaly with unchanged size and configuration of the lateral ventricles. Chronic cerebral white matter disease is incompletely evaluated. Vascular: Major intracranial arterial flow  voids are grossly preserved. T2 hyperintensity in the non dominant left sigmoid sinus may reflect slow flow or less likely occlusion. Skull and upper cervical spine: No suspicious marrow lesion. Sinuses/Orbits: Grossly unremarkable orbits.  Clear sinuses. Other: Small right frontal scalp hematoma. IMPRESSION: 1. Incomplete, motion degraded examination. 2. 2 cm diffusion abnormality involving left frontoparietal cortex at the vertex which may reflect an acute/early subacute infarct or possibly contusion. 3. Possible additional punctate acute to early subacute infarcts in the left frontal lobe and left insula. 4. Trace intraventricular hemorrhage. 5. Chronic changes as above. Electronically Signed   By: Sebastian AcheAllen  Grady M.D.   On: 11/01/2018 14:10      Scheduled Meds: . amLODipine  2.5 mg Oral Daily  . carvedilol  3.125 mg Oral BID WC  . escitalopram  10 mg Oral Daily  . galantamine  16 mg Oral Q breakfast  . glipiZIDE  5 mg Oral BID AC  . insulin aspart  0-15 Units Subcutaneous TID WC  . insulin aspart  0-5 Units Subcutaneous QHS  . levETIRAcetam  500 mg Oral BID  . lisinopril  20  mg Oral Daily  . LORazepam  0.25 mg Oral QHS  . magnesium oxide  400 mg Oral Daily  . Melatonin  3 mg Oral QHS  . mirtazapine  30 mg Oral QHS  . pantoprazole  40 mg Oral Daily  . phenytoin  200 mg Oral Daily  . risperiDONE  2 mg Oral QHS   Continuous Infusions: . sodium chloride 100 mL/hr at 11/02/18 0601     LOS: 0 days    Time spent: 45 minutes   Noralee Stain, DO [facility] [email or web address] Password [facility] 11/02/2018, 2:27 PM

## 2018-11-02 NOTE — Evaluation (Signed)
Physical Therapy Evaluation Patient Details Name: Arthur Berry MRN: [id number] DOB: Feb 24, 1946 Today's Date: 11/02/2018   History of Present Illness  72 yo [facility] due to confusion with dehydration. PMhx: cKD, CAD, DM, epilepsy, HTN, CVA, fall, adjustment reaction disorder with aggression  Clinical Impression  Pt with flat affect and eyes closed throughout session but answering all questions and conversing. Pt agreeable to limited mobility then declined further activity due to desire to rest. Pt without caregiver present and from ALF unsure if he has necessary level of care at ALF otherwise recommend SNF. Pt with decreased strength, function, balance, cognition and mobility who currently requires 2 person assist for functional mobility and will benefit from acute therapy to maximize mobility, function and safety to decrease burden of care.     Follow Up Recommendations SNF;Supervision/Assistance - 24 hour    Equipment Recommendations  Other (comment)(defer to next venue)    Recommendations for Other Services       Precautions / Restrictions Precautions Precautions: Fall      Mobility  Bed Mobility Overal bed mobility: Needs Assistance Bed Mobility: Rolling;Sidelying to Sit Rolling: Mod assist Sidelying to sit: Mod assist       General bed mobility comments: pt able to assist with reaching for rail and bending knee to roll with assist of pad to pivot hips and trunk, pt would only allow roll and partial rise to sitting then requested stop. assist to bring legs off and onto bed and for trunk elevation. Mod assist to [facility] trend[facility]burg with pt assisting with bil legs  Transfers                 General transfer comment: pt denied attempting  Ambulation/Gait                Stairs            Wheelchair Mobility    Modified Rankin (Stroke Patients Only)       Balance Overall balance assessment: Needs  assistance                                           Pertinent Vitals/Pain Pain Assessment: No/denies pain    Home Living Family/patient expects to be discharged to:: Assisted living               Home Equipment: Walker - 2 wheels;Wheelchair - manual Additional Comments: pt not the most reliable historian and providing history    Prior Function Level of Independence: Needs assistance   Gait / Transfers Assistance Needed: pt states he uses RW in room, WC for getting to dining hall  ADL's / Homemaking Assistance Needed: pt states he sponge bathes himself, staff does the homemaking        Hand Dominance        Extremity/Trunk Assessment   Upper Extremity Assessment Upper Extremity Assessment: Generalized weakness;Difficult to assess due to impaired cognition    Lower Extremity Assessment Lower Extremity Assessment: Generalized weakness;Difficult to assess due to impaired cognition    Cervical / Trunk Assessment Cervical / Trunk Assessment: Kyphotic  Communication   Communication: No difficulties  Cognition Arousal/Alertness: Lethargic Behavior During Therapy: Flat affect Overall Cognitive Status: No family/caregiver present to determine baseline cognitive functioning  General Comments: pt oriented to hospital and self, eyes closed throughout session and would not open on command. Providing history to his ability      General Comments      Exercises     Assessment/Plan    PT Assessment Patient needs continued PT services  PT Problem List Decreased range of motion;Decreased mobility;Decreased activity tolerance;Decreased coordination;Decreased safety awareness;Decreased strength;Decreased balance;Decreased cognition       PT Treatment Interventions Functional mobility training;Balance training;Patient/family education;DME instruction;Therapeutic activities;Gait training;Neuromuscular  re-education;Therapeutic exercise;Cognitive remediation    PT Goals (Current goals can be found in the Care Plan section)  Acute Rehab PT Goals PT Goal Formulation: Patient unable to participate in goal setting Time For Goal Achievement: 11/16/18 Potential to Achieve Goals: Fair    Frequency Min 2X/week   Barriers to discharge Decreased caregiver support      Co-evaluation               AM-PAC PT "6 Clicks" Mobility  Outcome Measure Help needed turning from your back to your side while in a flat bed without using bedrails?: A Lot Help needed moving from lying on your back to sitting on the side of a flat bed without using bedrails?: Total Help needed moving to and from a bed to a chair (including a wheelchair)?: Total Help needed standing up from a chair using your arms (e.g., wheelchair or bedside chair)?: Total Help needed to walk in hospital room?: Total Help needed climbing 3-5 steps with a railing? : Total 6 Click Score: 7    End of Session   Activity Tolerance: Patient limited by fatigue Patient left: in bed;with call bell/phone within reach;with bed alarm set Nurse Communication: Mobility status;Need for lift equipment PT Visit Diagnosis: Other abnormalities of gait and mobility (R26.89);Muscle weakness (generalized) (M62.81);Difficulty in walking, not elsewhere classified (R26.2)    Time: [id number] PT Time Calculation (min) (ACUTE ONLY): 16 min   Charges:   PT Evaluation $PT Eval Moderate Complexity: 1 Mod          Pocahontas Cohenour Abner Greenspanabor Sandie Swayze, PT Acute Rehabilitation Services Pager: [phone] Office: [phone]   Enedina FinnerMaija B Zenith Lamphier 11/02/2018, 9:38 AM

## 2018-11-03 LAB — CBC
HCT: 32.5 % — ABNORMAL LOW (ref 39.0–52.0)
Hemoglobin: 10.6 g/dL — ABNORMAL LOW (ref 13.0–17.0)
MCH: 31 pg (ref 26.0–34.0)
MCHC: 32.6 g/dL (ref 30.0–36.0)
MCV: 95 fL (ref 80.0–100.0)
Platelets: 130 10*3/uL — ABNORMAL LOW (ref 150–400)
RBC: 3.42 MIL/uL — ABNORMAL LOW (ref 4.22–5.81)
RDW: 12.4 % (ref 11.5–15.5)
WBC: 10 10*3/uL (ref 4.0–10.5)
nRBC: 0 % (ref 0.0–0.2)

## 2018-11-03 LAB — MAGNESIUM: MAGNESIUM: 1.4 mg/dL — AB (ref 1.7–2.4)

## 2018-11-03 LAB — BASIC METABOLIC PANEL
Anion gap: 10 (ref 5–15)
BUN: 21 mg/dL (ref 8–23)
CHLORIDE: 104 mmol/L (ref 98–111)
CO2: 24 mmol/L (ref 22–32)
Calcium: 8.7 mg/dL — ABNORMAL LOW (ref 8.9–10.3)
Creatinine, Ser: 1.01 mg/dL (ref 0.61–1.24)
GFR calc Af Amer: >60 mL/min (ref >60)
GFR calc non Af Amer: >60 mL/min (ref >60)
Glucose, Bld: 301 mg/dL — ABNORMAL HIGH (ref 70–99)
Potassium: 3.1 mmol/L — ABNORMAL LOW (ref 3.5–5.1)
SODIUM: 138 mmol/L (ref 135–145)

## 2018-11-03 LAB — PHENYTOIN LEVEL, TOTAL: Phenytoin Lvl: 8.3 ug/mL — ABNORMAL LOW (ref 10.0–20.0)

## 2018-11-03 LAB — GLUCOSE, CAPILLARY
GLUCOSE-CAPILLARY: 239 mg/dL — AB (ref 70–99)
Glucose-Capillary: 268 mg/dL — ABNORMAL HIGH (ref 70–99)
Glucose-Capillary: 283 mg/dL — ABNORMAL HIGH (ref 70–99)
Glucose-Capillary: 261 mg/dL — ABNORMAL HIGH (ref 70–99)

## 2018-11-03 MED ORDER — MAGNESIUM SULFATE 2 GM/50ML IV SOLN
2.0000 g | Freq: Once | INTRAVENOUS | Status: AC
  Administered 2018-11-03: 2 g via INTRAVENOUS
  Filled 2018-11-03: qty 50

## 2018-11-03 MED ORDER — INSULIN GLARGINE 100 UNIT/ML ~~LOC~~ SOLN
15.0000 [IU] | Freq: Every day | SUBCUTANEOUS | Status: DC
  Administered 2018-11-03 – 2018-11-06 (×4): 15 [IU] via SUBCUTANEOUS
  Filled 2018-11-03 (×5): qty 0.15

## 2018-11-03 MED ORDER — POTASSIUM CHLORIDE CRYS ER 20 MEQ PO TBCR
40.0000 meq | EXTENDED_RELEASE_TABLET | Freq: Two times a day (BID) | ORAL | Status: AC
  Administered 2018-11-03 (×2): 40 meq via ORAL
  Filled 2018-11-03 (×2): qty 2

## 2018-11-03 NOTE — [place] FL2 (Signed)
[place] MEDICAID FL2 LEVEL OF CARE SCREENING TOOL     IDENTIFICATION  Patient Name: Arthur Berry Birthdate: [birth year] Sex: male Admission Date (Current Location): 11/01/2018  [facility] [place]   Facility and Address:  [facility] [place], [address], [place], [place] [address]      Provider Number: [id number]  Attending Physician Name and Address:  Noralee Stainhoi, Jennifer, DO  Relative Name and Phone Number:  Josephine CablesHarvey Wren, Brother, [phone]    Current Level of Care: Hospital Recommended Level of Care: Skilled Nursing Facility Prior Approval Number:    Date Approved/Denied:   PASRR Number:    Discharge Plan: SNF    Current Diagnoses: Patient Active Problem List   Diagnosis Date Noted  . Hypernatremia 11/01/2018  . Status post total replacement of right hip 11/12/2017  . Closed right hip fracture, initial encounter (HCC) 10/23/2017  . Closed subcapital fracture of right femur (HCC)   . Adjustment reaction with aggression 05/14/2017  . Type 2 diabetes mellitus with other circulatory complications (HCC) 06/19/2015  . Thrombocytopenia (HCC) 06/19/2015  . Cerebral infarction due to unspecified mechanism   . Dilantin toxicity 06/18/2015  . Phenytoin toxicity   . Stroke (HCC) 06/16/2015  . Fall 06/16/2015  . Epilepsy (HCC) 06/16/2015  . BPH (benign prostatic hyperplasia) 06/16/2015  . Leukocytosis 06/16/2015  . CVA (cerebral infarction) 06/16/2015  . CVA (cerebral vascular accident) (HCC) 06/15/2015  . GERD (gastroesophageal reflux disease) 04/23/2014  . Potassium (K) deficiency 12/11/2012  . Precordial pain 12/11/2012  . Dehydration 04/15/2012  . AKI (acute kidney injury) (HCC) 04/14/2012  . HLD (hyperlipidemia) 06/01/2010  . Essential hypertension, benign 06/01/2010  . DYSPNEA 06/01/2010  . Diabetes mellitus without complication (HCC) 11/04/2009  . RECTAL BLEEDING 11/04/2009  . PERSONAL HX COLONIC POLYPS 11/04/2009     Orientation RESPIRATION BLADDER Height & Weight     Self  Normal Incontinent Weight: 163 lb (73.9 kg) Height:  5\' 8"  (172.7 cm)  BEHAVIORAL SYMPTOMS/MOOD NEUROLOGICAL BOWEL NUTRITION STATUS      Incontinent Diet(Carb modified )  AMBULATORY STATUS COMMUNICATION OF NEEDS Skin   Limited Assist Verbally Normal, Bruising(Ecchymosis on face/eye; dry skin)                       Personal Care Assistance Level of Assistance  Bathing, Feeding, Dressing, Total care Bathing Assistance: Limited assistance Feeding assistance: Limited assistance Dressing Assistance: Limited assistance Total Care Assistance: Limited assistance   Functional Limitations Info  Sight, Hearing, Speech Sight Info: Adequate Hearing Info: Adequate Speech Info: Adequate    SPECIAL CARE FACTORS FREQUENCY  PT (By licensed PT), OT (By licensed OT), Speech therapy     PT Frequency: 5x/wk OT Frequency: 5x/wk     Speech Therapy Frequency: 5x/wk      Contractures Contractures Info: Not present    Additional Factors Info  Code Status, Allergies Code Status Info: Full Code Allergies Info: No known allergies            Current Medications (11/03/2018):  This is the current hospital active medication list Current Facility-Administered Medications  Medication Dose Route Frequency Provider Last Rate Last Dose  . acetaminophen (TYLENOL) tablet 650 mg  650 mg Oral Q6H PRN Kc, Ramesh, MD       Or  . acetaminophen (TYLENOL) suppository 650 mg  650 mg Rectal Q6H PRN Kc, Ramesh, MD      . amLODipine (NORVASC) tablet 2.5 mg  2.5 mg Oral  Daily Lanae BoastKc, Ramesh, MD   2.5 mg at 11/03/18 0911  . carvedilol (COREG) tablet 3.125 mg  3.125 mg Oral BID WC Kc, Ramesh, MD   3.125 mg at 11/03/18 0911  . escitalopram (LEXAPRO) tablet 10 mg  10 mg Oral Daily Kc, Ramesh, MD   10 mg at 11/03/18 0910  . galantamine (RAZADYNE ER) 24 hr capsule 16 mg  16 mg Oral Q breakfast Kc, Ramesh, MD   16 mg at 11/03/18 0908  . insulin aspart  (novoLOG) injection 0-15 Units  0-15 Units Subcutaneous TID WC Lanae BoastKc, Ramesh, MD   8 Units at 11/03/18 0909  . insulin aspart (novoLOG) injection 0-5 Units  0-5 Units Subcutaneous QHS Lanae BoastKc, Ramesh, MD   2 Units at 11/02/18 2150  . insulin glargine (LANTUS) injection 15 Units  15 Units Subcutaneous QHS Noralee Stainhoi, Jennifer, DO      . levETIRAcetam (KEPPRA) tablet 500 mg  500 mg Oral BID Lanae BoastKc, Ramesh, MD   500 mg at 11/03/18 0911  . lisinopril (PRINIVIL,ZESTRIL) tablet 20 mg  20 mg Oral Daily Kc, Ramesh, MD   20 mg at 11/03/18 0911  . LORazepam (ATIVAN) tablet 0.25 mg  0.25 mg Oral QHS Kc, Ramesh, MD   0.25 mg at 11/02/18 2144  . magnesium hydroxide (MILK OF MAGNESIA) suspension 30 mL  30 mL Oral QHS PRN Kc, Ramesh, MD      . Melatonin TABS 3 mg  3 mg Oral QHS Kc, Ramesh, MD   3 mg at 11/02/18 2144  . mirtazapine (REMERON) tablet 30 mg  30 mg Oral QHS Kc, Dayna Barkeramesh, MD   30 mg at 11/02/18 2144  . neomycin-bacitracin-polymyxin (NEOSPORIN) ointment packet 1 application  1 application Topical Daily PRN Lanae BoastKc, Ramesh, MD   1 application at 11/03/18 0909  . ondansetron (ZOFRAN) tablet 4 mg  4 mg Oral Q6H PRN Kc, Ramesh, MD   4 mg at 11/02/18 2144   Or  . ondansetron (ZOFRAN) injection 4 mg  4 mg Intravenous Q6H PRN Kc, Ramesh, MD      . pantoprazole (PROTONIX) EC tablet 40 mg  40 mg Oral Daily Kc, Ramesh, MD   40 mg at 11/03/18 0910  . phenytoin (DILANTIN) ER capsule 200 mg  200 mg Oral Daily Kc, Ramesh, MD   200 mg at 11/03/18 0908  . potassium chloride SA (K-DUR,KLOR-CON) CR tablet 40 mEq  40 mEq Oral BID WC Noralee Stainhoi, Jennifer, DO   40 mEq at 11/03/18 0910  . risperiDONE (RISPERDAL) tablet 2 mg  2 mg Oral QHS Lanae BoastKc, Ramesh, MD   2 mg at 11/02/18 2144     Discharge Medications: Please see discharge summary for a list of discharge medications.  Relevant Imaging Results:  Relevant Lab Results:   Additional Information SSN: [id number]  Nada BoozerCaitlin B Kaiden Pech, LCSWA

## 2018-11-03 NOTE — Evaluation (Signed)
Clinical/Bedside Swallow Evaluation Patient Details  Name: Arthur Berry MRN: [id number] Date of Birth: November 21, 1945  Today's Date: 11/03/2018 Time: SLP Start Time (ACUTE ONLY): 0933 SLP Stop Time (ACUTE ONLY): 0945 SLP Time Calculation (min) (ACUTE ONLY): 12 min  Past Medical History:  Past Medical History:  Diagnosis Date  . Adjustment reaction with aggression 05/14/2017  . Arthritis   . Chronic kidney disease   . Colon polyp 2005   Tubular Adenoma  . Coronary artery disease, non-occlusive    Minimal nonobstructive CAD, nl LV systolic fxn by cath 12/11/12  . Diabetes mellitus 2001   TYPE 2  . Diverticulosis   . Epilepsy (HCC)   . GERD (gastroesophageal reflux disease) 04/23/2014  . Goiter, nontoxic, multinodular    Thyroid US 11/2012  . Hyperlipidemia 1999  . Hypertension 1999  . Internal hemorrhoids   . Neuromuscular disorder (HCC)   . OSA (obstructive sleep apnea)   . Prostate enlargement   . Sleep apnea   . Splinter 04/27/13   metal plinter removed rt pointer finger  . Stroke (HCC) 06/2015  . Vision changes 06/2015   since CVA   Past Surgical History:  Past Surgical History:  Procedure Laterality Date  . CHOLECYSTECTOMY    . COLONOSCOPY  12/28/2009  . ESOPHAGOGASTRODUODENOSCOPY  10/14/2012   normal   . INGUINAL HERNIA REPAIR  06/27/11   left  . KNEE SURGERY  2008   Left  . LEFT HEART CATHETERIZATION WITH CORONARY ANGIOGRAM N/A 12/11/2012   Procedure: LEFT HEART CATHETERIZATION WITH CORONARY ANGIOGRAM;  Surgeon: Tonny BollmanMichael Cooper, MD;  [facility];  Service: Cardiovascular;  Laterality: N/A;  . TOTAL HIP ARTHROPLASTY Right 10/23/2017   Procedure: TOTAL HIP ARTHROPLASTY ANTERIOR APPROACH;  Surgeon: Kathryne HitchBlackman, Christopher Y, MD;  Location: [facility] OR;  Service: Orthopedics;  Laterality: Right;   HPI:  72 y.o. male with past medical history significant for slight confusion and underlying dementia, CKD, CAD, diabetes mellitus, history of epilepsy, stroke who [facility].  Pt has had multiple falls including recent head injury 12/10.  MRI this admission 12/20 revealed:  "1. Incomplete, motion degraded examination. 2. 2 cm diffusion abnormality involving left frontoparietal cortex at the vertex which may reflect an acute/early subacute infarct or possibly contusion. 3. Possible additional punctate acute to early subacute infarcts in the left frontal lobe and left insula. 4. Trace intraventricular hemorrhage. 5. Chronic changes as above".  CXR 12/20 with no acute findings   Assessment / Plan / Recommendation Clinical Impression  Pt presents with functional swallowing as assessed clinically.  Pt tolerated all consistencies trialed with no clinical s/s of aspiration.  With regular solid there was prolonged oral phase (pt did take large bite) and mild diffuse oral residue which cleared with liquid wash.  Recommend continuing regular texture diet and thin liquids.  Family reports change to level of arousal, stating it is difficult to keep pt awake, and has noted motor slowing.  Sister denies difficulty with word finding or increased confusion. SLP Visit Diagnosis: Dysphagia, oropharyngeal phase (R13.12)    Aspiration Risk  No limitations    Diet Recommendation Regular;Thin liquid   Liquid Administration via: Cup;Straw Medication Administration: Whole meds with puree(as tolerated) Supervision: Staff to assist with self feeding Compensations: Minimize environmental distractions;Slow rate;Small sips/bites;Follow solids with liquid Postural Changes: Seated upright at 90 degrees    Other  Recommendations Oral Care Recommendations: Oral care BID   Follow up Recommendations None        Swallow  Study   General HPI: 72 y.o. male with past medical history significant for slight confusion and underlying dementia, CKD, CAD, diabetes mellitus, history of epilepsy, stroke [facility].  Pt has had multiple falls including recent head injury 12/10.   MRI this admission 12/20 revealed "1. Incomplete, motion degraded examination. 2. 2 cm diffusion abnormality involving left frontoparietal cortex at the vertex which may reflect an acute/early subacute infarct or possibly contusion. 3. Possible additional punctate acute to early subacute infarcts in the left frontal lobe and left insula. 4. Trace intraventricular hemorrhage. 5. Chronic changes as above".    CXR 12/20 with no acute findings Type of Study: Bedside Swallow Evaluation Diet Prior to this Study: Regular;Thin liquids Temperature Spikes Noted: No Respiratory Status: Room air History of Recent Intubation: No Behavior/Cognition: Alert;Cooperative;Pleasant mood Oral Cavity Assessment: Within Functional Limits Oral Cavity - Dentition: Adequate natural dentition Vision: Functional for self-feeding Self-Feeding Abilities: Able to feed self;Needs assist Patient Positioning: Upright in bed Baseline Vocal Quality: Normal Volitional Cough: Strong Volitional Swallow: Unable to elicit    Oral/Motor/Sensory Function Overall Oral Motor/Sensory Function: Mild impairment Facial ROM: Reduced right;Reduced left Facial Symmetry: Within Functional Limits Lingual ROM: Reduced right;Reduced left Lingual Symmetry: Within Functional Limits Lingual Strength: Reduced(Tremor) Velum: Within Functional Limits Mandible: Within Functional Limits   Ice Chips Ice chips: Not tested   Thin Liquid Thin Liquid: Within functional limits Presentation: Straw    Nectar Thick Nectar Thick Liquid: Not tested   Honey Thick Honey Thick Liquid: Not tested   Puree Puree: Within functional limits Presentation: Spoon   Solid     Solid: Impaired Presentation: Self Fed(SLP fed) Oral Phase Functional Implications: Oral residue;Prolonged oral transit      Kerrie PleasureLeigh E Lateya Dauria, MA, CCC-SLP Acute Rehabilitation Services Office: [phone]; Pager (12/22): [phone] 11/03/2018,9:54 AM

## 2018-11-03 NOTE — Evaluation (Signed)
Occupational Therapy Evaluation Patient Details Name: Arthur BurySamuel W Hindley MRN: [id number] DOB: 06-24-1946 Today's Date: 11/03/2018    History of Present Illness 72 y.o. male presenting with confusion. Pt with past medical history significant dementia, CKD, CAD, diabetes mellitus, history of epilepsy, and stroke. MRI incomplete due to movement; showing "possible acute/subacute infarct at left frontoparietal cortex" and "possible acute-subacute infarcts in the left frontal lobe and left insula."   Clinical Impression   PTA, [facility] unit per pt's sister) and required assistance for all ADLs and functional transfers to w/c. Pt currently requiring Max A for ADLs and Mod A +2 for functional transfers. Pt presenting with vision deficits as well and overreaching to left during targeted grasp as seen during self feeding task. Pt would benefit from further acute OT to facilitate safe dc. Per family, pt was receiving assistance for all ADLs at memory care unit and recommend dc to prior residence with follow up OT to optimize safety, independence with ADLs, and return to PLOF. If prior residence is unable to provide increased assistance for ADLs and functional transfers, then pt will require SNF.      Follow Up Recommendations  SNF;Supervision/Assistance - 24 hour    Equipment Recommendations  None recommended by OT    Recommendations for Other Services PT consult;Speech consult     Precautions / Restrictions Precautions Precautions: Fall Restrictions Weight Bearing Restrictions: No      Mobility Bed Mobility Overal bed mobility: Needs Assistance Bed Mobility: Supine to Sit     Supine to sit: Mod assist;HOB elevated     General bed mobility comments: cues to bring BLEs towards EOB. Mod A for elevating trunk and then bringing hips towards EOB with bed pad  Transfers Overall transfer level: Needs assistance   Transfers: Sit to/from Stand;Stand Pivot  Transfers Sit to Stand: Mod assist Stand pivot transfers: Mod assist;+2 physical assistance       General transfer comment: Mod A for power up into standing and then gain balance. Pt then requiring Mod A +2 for pivot to recliner and requring assistance to guide hips towards reclienr as pt attempting to prematurely sit. Declined RW stating he doesnt use one    Balance Overall balance assessment: Needs assistance Sitting-balance support: No upper extremity supported;Feet supported Sitting balance-Leahy Scale: Fair     Standing balance support: During functional activity;No upper extremity supported Standing balance-Leahy Scale: Poor Standing balance comment: Reliant on physical A                           ADL either performed or assessed with clinical judgement   ADL Overall ADL's : Needs assistance/impaired Eating/Feeding: Minimal assistance;Sitting Eating/Feeding Details (indicate cue type and reason): Min A to initate task and guide hand to mouth while holding spoon. Pt reaching for spoon and significantly overshooting to left of spoon. Max cues for completion of task Grooming: Minimal assistance;Sitting Grooming Details (indicate cue type and reason): Max cues to perform task Upper Body Bathing: Maximal assistance;Sitting Upper Body Bathing Details (indicate cue type and reason): donning new gown. Lower Body Bathing: Maximal assistance;+2 for physical assistance   Upper Body Dressing : Maximal assistance;Sitting   Lower Body Dressing: Maximal assistance;Sit to/from stand   Toilet Transfer: Moderate assistance;+2 for physical assistance Toilet Transfer Details (indicate cue type and reason): Simulated to recliner Toileting- Clothing Manipulation and Hygiene: Maximal assistance;+2 for physical assistance;Sit to/from stand Toileting - ArchitectClothing Manipulation Details (  indicate cue type and reason): +2 for standing balance and second person to perform toilet hygiene. Pt  with bowel incontience at bed and during standing     Functional mobility during ADLs: Moderate assistance;+2 for physical assistance(stand pivot only) General ADL Comments: Requiring Max A due to cognitive deficits. During self feeding, pt reaching for spoon with significant overreach to left. Pt requiring Mod A +2 for stand pivot to recliner and presenting with decreased safety/awareness.      Vision Baseline Vision/History: (Family reports no peripheral vision) Patient Visual Report: Diplopia Vision Assessment?: Vision impaired- to be further tested in functional context Additional Comments: Pt presenting with significant over reach to left during self feeding task. Pt eventually reporting he is seeing double.      Perception     Praxis      Pertinent Vitals/Pain Pain Assessment: Faces Faces Pain Scale: No hurt Pain Intervention(s): Monitored during session     Hand Dominance Left   Extremity/Trunk Assessment Upper Extremity Assessment Upper Extremity Assessment: Generalized weakness   Lower Extremity Assessment Lower Extremity Assessment: Generalized weakness   Cervical / Trunk Assessment Cervical / Trunk Assessment: Kyphotic   Communication Communication Communication: No difficulties   Cognition Arousal/Alertness: Awake/alert Behavior During Therapy: Flat affect Overall Cognitive Status: History of cognitive impairments - at baseline Area of Impairment: Orientation;Attention;Memory;Following commands;Safety/judgement;Awareness;Problem solving                 Orientation Level: Person Current Attention Level: Focused Memory: Decreased short-term memory Following Commands: Follows one step commands inconsistently Safety/Judgement: Decreased awareness of safety;Decreased awareness of deficits Awareness: Intellectual Problem Solving: Slow processing;Decreased initiation;Difficulty sequencing;Requires verbal cues;Requires tactile cues General Comments: Pt  with baseline dementia. Pt oriented to self only. Pt requiring increased cues and time for following commands. Family reporting that pt is more confused but close to baseline cognition   General Comments  Sister and nephews present upon arrival and providing home information and PLOF.    Exercises     Shoulder Instructions      Home Living Family/patient expects to be discharged to:: Skilled nursing facility                             Home Equipment: Dan HumphreysWalker - 2 wheels;Wheelchair - manual   Additional Comments: Family present and report that pt resided at a memory care unit      Prior Functioning/Environment Level of Independence: Needs assistance  Gait / Transfers Assistance Needed: Pt's family reports that he required assistance to transfer to a w/c and used that for functional mobility. Family also reporting recent falls due to attempting to go to the bathroom without assistance ADL's / Homemaking Assistance Needed: Pt's family reporting that pt required assistance for all ADLs including self feeding            OT Problem List: Decreased strength;Decreased range of motion;Decreased activity tolerance;Impaired balance (sitting and/or standing);Impaired vision/perception;Decreased coordination;Decreased cognition;Decreased safety awareness;Decreased knowledge of use of DME or AE;Decreased knowledge of precautions;Impaired UE functional use      OT Treatment/Interventions: Self-care/ADL training;Therapeutic exercise;Energy conservation;DME and/or AE instruction;Therapeutic activities;Patient/family education    OT Goals(Current goals can be found in the care plan section) Acute Rehab OT Goals Patient Stated Goal: "eat this Robers sauce" OT Goal Formulation: With patient Time For Goal Achievement: 11/17/18 Potential to Achieve Goals: Good ADL Goals Pt Will Perform Grooming: with set-up;with supervision;sitting(Min cues) Pt Will Perform Upper Body Dressing: with min  assist;sitting(Min cues) Pt Will Transfer to Toilet: with min guard assist;stand pivot transfer;bedside commode Pt Will Perform Toileting - Clothing Manipulation and hygiene: with min assist;sit to/from stand;sitting/lateral leans Additional ADL Goal #1: Pt will perform bed mobility with supervision in preparation for ADLs  OT Frequency: Min 2X/week   Barriers to D/C:            Co-evaluation              AM-PAC OT "6 Clicks" Daily Activity     Outcome Measure Help from another person eating meals?: A Lot Help from another person taking care of personal grooming?: A Lot Help from another person toileting, which includes using toliet, bedpan, or urinal?: A Lot Help from another person bathing (including washing, rinsing, drying)?: A Lot Help from another person to put on and taking off regular upper body clothing?: A Lot Help from another person to put on and taking off regular lower body clothing?: A Lot 6 Click Score: 12   End of Session Equipment Utilized During Treatment: Gait belt Nurse Communication: Mobility status;Other (comment)(BM in bed. call bed not working)  Activity Tolerance: Patient tolerated treatment well Patient left: in chair;with call bell/phone within reach;with chair alarm set;with nursing/sitter in room  OT Visit Diagnosis: Unsteadiness on feet (R26.81);Other abnormalities of gait and mobility (R26.89);Muscle weakness (generalized) (M62.81);Other symptoms and signs involving cognitive function;Low vision, both eyes (H54.2);History of falling (Z91.81)                Time: 8469-6295 OT Time Calculation (min): 24 min Charges:  OT General Charges $OT Visit: 1 Visit OT Evaluation $OT Eval Moderate Complexity: 1 Mod OT Treatments $Self Care/Home Management : 8-22 mins  Arris Meyn MSOT, OTR/L Acute Rehab Pager: [phone] Office: [phone]   Theodoro Grist Jency Schnieders 11/03/2018, 5:14 PM

## 2018-11-03 NOTE — Clinical Social Work Note (Signed)
Clinical Social Work Assessment  Patient Details  Name: Arthur Berry MRN: [id number] Date of Birth: [birth year]  Date of referral:  11/03/18               Reason for consult:  Discharge Planning                Permission sought to share information with:  Case Manager Permission granted to share information::  Yes, Verbal Permission Granted  Name::     Jamichael Knotts   Agency::  [facility]   Relationship::  Brother   Contact Information:  [phone]  Housing/Transportation Living arrangements for the past 2 months:  [place] of Information:  Siblings Patient Interpreter Needed:  None Criminal Activity/Legal Involvement Pertinent to Current Situation/Hospitalization:  No - Comment as needed Significant Relationships:  Siblings Lives with:  Facility Resident Do you feel safe going back to the place where you live?  No Need for family participation in patient care:  Yes (Comment)  Care giving concerns:    Patient's brother has POA. He placed his brother at [facility] based on the recommendation from someone in the community. He was not impressed with [facility]. Patient's brother feels that he is to far away and would like to explore moving his brother to [facility] to be in the assisted living facility. Patient's brother has concerns that his brothers needs were not being met. Patient's brother feels that he could be better off at [facility] to receive the additional resources that his brother may require.    Social Worker assessment / plan:   CSW spoke with the patient's brother over the phone. Patient's brother is POA. He is wanting to move his brother to [facility] assisted living facility so that he would be closer and he would be able to care for him better. Patient's brother does not want to send him back to [facility]. CSW has completed the Fl2. CSW has called and spoken with [facility] about the possibility for the patient being elligible for and ALF.  [facility] is looking into it.   PASSAR was not obtained due to [place] [facility] being down. Will need to get that and send to the facility.   Employment status:  Retired Forensic scientist:  Medicare PT Recommendations:  [place] / Referral to community resources:  [place]  Patient/Family's Response to care:  Patient's brother is aware of his condition and plans to help care for him.   Patient/Family's Understanding of and Emotional Response to Diagnosis, Current Treatment, and Prognosis:  Patient's brother is understanding is willing to do whatever it takes to help his brother.   Emotional Assessment Appearance:  Appears stated age Attitude/Demeanor/Rapport:  Unable to Assess Affect (typically observed):  Unable to Assess Orientation:  Oriented to Self Alcohol / Substance use:  Not Applicable Psych involvement (Current and /or in the community):  No (Comment)  Discharge Needs  Concerns to be addressed:  Discharge Planning Concerns Readmission within the last 30 days:  No Current discharge risk:  Dependent with Mobility, Cognitively Impaired Barriers to Discharge:  Continued Medical Work up, [place], [employer] 11/03/2018, 12:58 PM

## 2018-11-03 NOTE — Progress Notes (Signed)
PROGRESS NOTE    Arthur Berry  ZOX:[id number] DOB: 1946/03/28 DOA: 11/01/2018 PCP: Rodrigo RanPerini, Mark, MD     Brief Narrative:  Arthur Berry is a 72 y.o. male with past medical history significant for slight confusion and underlying dementia, CKD, CAD, diabetes mellitus, history of epilepsy, stroke who lives at [facility].  He had a recent head injury 12/10 from fall and had CT head that showed no intracranial bleeding and had suture placed.  He was again sent from nursing home due to uncontrolled blood sugar in 500, worsening of his mental status, "patient was not being himself".  Apparently patient has not been eating well.  There was no reported recent falls since the last one.  Imaging in the emergency department revealed trace intraventricular hemorrhage, acute/subacute infarct.  New events last 24 hours / Subjective: Much more alert and interactive today. Eating breakfast with sister and brother in law at bedside. Sister very concerned about his swallowing, noted some choking on food last evening. Patient without any physical complaints today. He is wheelchair bound at bedside and requires a lot of help with ADLs due to previous stroke hx.   Assessment & Plan:   Active Problems:   HLD (hyperlipidemia)   Dehydration   Stroke (HCC)   Fall   Type 2 diabetes mellitus with other circulatory complications (HCC)   Hypernatremia   Acute encephalopathy Appears to be back to his baseline  Recurrent falls PT OT, rec SNF   Left frontal lobe and left insular punctate infarcts  MRI brain showed 2cm diffuse abnormality left frontoparietal cortex possible acute/early subacute infarct or possibly contusion, possible punctate acute/subacute infarct left frontal lobe and left insula  Neurology consulted and following  Hold aspirin PT OT SLP   Intraventricular hemorrhage On-call neurosurgeon does not feel it is clinically significant, had no other neurosurgical interventions offered at this  time Hold aspirin  Hypernatremia Suspect secondary to dehydration, poor oral intake Resolved Encourage PO intake   Diabetes mellitus type 2 with uncontrolled hyperglycemia Hemoglobin A1c 9.2  Stop glipizide, concerned about hypoglycemic episodes with poor PO intake at ALF and frequent falls  Lantus, SSI   HTN Continue norvasc, coreg, lisinopril   History of epilepsy Continue Dilantin, Keppra  Mood disorder Continue Lexapro, galantamine, Ativan, Remeron, Risperdal  Hypokalemia Replace, trend   Hypomagnesemia Replace, trend    DVT prophylaxis: SCD Code Status: Full Family Communication: Sister and brother in law at bedside  Disposition Plan: Pending Neurology work up, SNF placement. Family wants to look into [facility], discussed with SW    Consultants:   Neurology  Procedures:   None   Antimicrobials:  Anti-infectives (From admission, onward)   None       Objective: Vitals:   11/02/18 1641 11/02/18 2133 11/03/18 0624 11/03/18 0726  BP: 137/83 140/79 (!) 141/76 (!) 151/86  Pulse: (!) 57 62 60 (!) 57  Resp: 18 18 18 18   Temp: 98.3 F (36.8 C) 97.6 F (36.4 C) 97.7 F (36.5 C) 97.9 F (36.6 C)  TempSrc: Oral Oral Oral Oral  SpO2: 97% 98% 98% 99%  Weight:  73.9 kg    Height:        Intake/Output Summary (Last 24 hours) at 11/03/2018 0936 Last data filed at 11/03/2018 0900 Gross per 24 hour  Intake 3040 ml  Output 1320 ml  Net 1720 ml   Filed Weights   11/01/18 2200 11/02/18 2133  Weight: 90.7 kg 73.9 kg    Examination: General  exam: Appears calm and comfortable  Respiratory system: Clear to auscultation. Respiratory effort normal. Cardiovascular system: S1 & S2 heard, RRR. No JVD, murmurs, rubs, gallops or clicks. No pedal edema. Gastrointestinal system: Abdomen is nondistended, soft and nontender. No organomegaly or masses felt. Normal bowel sounds heard. Central nervous system: Alert and oriented. Moves all extremities, although limited  UEs due to previous strokes  Extremities: Symmetric 5 x 5 power. Skin: No rashes, lesions or ulcers Psychiatry: Judgement and insight appear stable    Data Reviewed: I have personally reviewed following labs and imaging studies  CBC: Recent Labs  Lab 11/01/18 1036 11/02/18 0507 11/03/18 0427  WBC 11.2* 12.4* 10.0  NEUTROABS 7.7  --   --   HGB 12.7* 11.4* 10.6*  HCT 40.5 34.2* 32.5*  MCV 100.0 97.7 95.0  PLT 150 137* 130*   Basic Metabolic Panel: Recent Labs  Lab 11/01/18 1036 11/02/18 0507 11/03/18 0427  NA 151* 147* 138  K 4.0 3.4* 3.1*  CL 113* 109 104  CO2 26 25 24   GLUCOSE 425* 190* 301*  BUN 34* 23 21  CREATININE 1.23 1.08 1.01  CALCIUM 9.7 8.5* 8.7*  MG  --   --  1.4*   GFR: Estimated Creatinine Clearance: 64 mL/min (by C-G formula based on SCr of 1.01 mg/dL). Liver Function Tests: Recent Labs  Lab 11/01/18 1036  AST 12*  ALT 16  ALKPHOS 162*  BILITOT 0.6  PROT 7.0  ALBUMIN 3.7   No results for input(s): LIPASE, AMYLASE in the last 168 hours. No results for input(s): AMMONIA in the last 168 hours. Coagulation Profile: No results for input(s): INR, PROTIME in the last 168 hours. Cardiac Enzymes: No results for input(s): CKTOTAL, CKMB, CKMBINDEX, TROPONINI in the last 168 hours. BNP (last 3 results) No results for input(s): PROBNP in the last 8760 hours. HbA1C: Recent Labs    11/01/18 1649  HGBA1C 9.2*   CBG: Recent Labs  Lab 11/02/18 0826 11/02/18 1142 11/02/18 1640 11/02/18 2131 11/03/18 0727  GLUCAP 179* 198* 203* 231* 268*   Lipid Profile: No results for input(s): CHOL, HDL, LDLCALC, TRIG, CHOLHDL, LDLDIRECT in the last 72 hours. Thyroid Function Tests: No results for input(s): TSH, T4TOTAL, FREET4, T3FREE, THYROIDAB in the last 72 hours. Anemia Panel: No results for input(s): VITAMINB12, FOLATE, FERRITIN, TIBC, IRON, RETICCTPCT in the last 72 hours. Sepsis Labs: No results for input(s): PROCALCITON, LATICACIDVEN in the last 168  hours.  Recent Results (from the past 240 hour(s))  MRSA PCR Screening     Status: None   Collection Time: 11/01/18  5:15 PM  Result Value Ref Range Status   MRSA by PCR NEGATIVE NEGATIVE Final    Comment:        The GeneXpert MRSA Assay (FDA approved for NASAL specimens only), is one component of a comprehensive MRSA colonization surveillance program. It is not intended to diagnose MRSA infection nor to guide or monitor treatment for MRSA infections. Performed at [facility], [address] [place], [place] [address]        Radiology Studies: Dg Chest 1 View  Result Date: 11/01/2018 CLINICAL DATA:  Altered mental status, hypertension EXAM: CHEST  1 VIEW COMPARISON:  06/01/2018 FINDINGS: Heart and mediastinal contours are within normal limits. No focal opacities or effusions. No acute bony abnormality. IMPRESSION: No active disease. Electronically Signed   By: Charlett NoseKevin  Dover M.D.   On: 11/01/2018 11:06   Ct Head Wo Contrast  Result Date: 11/01/2018 CLINICAL DATA:  Altered level of consciousness. Hyperglycemia. History of dementia. EXAM: CT HEAD WITHOUT CONTRAST TECHNIQUE: Contiguous axial images were obtained from the base of the skull through the vertex without intravenous contrast. COMPARISON:  10/22/2018 FINDINGS: Brain: Trace hyperdense blood products are present in the occipital horns of the lateral ventricles, new from the prior study. There may be a 3 mm focus of petechial hemorrhage in the left frontal lobe along the lateral margin of the cyst. No intracranial hemorrhage is identified elsewhere. Congenital brain anomalies are again noted including agenesis of the corpus callosum, colpocephaly, and an interhemispheric cyst centered left of midline. The ventricles are unchanged in size and configuration. Patchy cerebral white matter hypodensities are unchanged and nonspecific but may reflect mild chronic small vessel ischemic disease. There is mild cerebellar atrophy. No  acute infarct or extra-axial fluid collection is identified. Vascular: Calcified atherosclerosis at the skull base. No hyperdense vessel. Skull: No fracture or focal osseous lesion. Sinuses/Orbits: Minimal mucosal thickening in the ethmoid air cells. Clear mastoid air cells. Unremarkable orbits. Other: Decreased size of right frontal scalp hematoma. IMPRESSION: 1. Trace intraventricular hemorrhage. 2. Possible 3 mm petechial hemorrhage in the left frontal lobe. 3. Decreased size of frontal scalp hematoma. Critical Value/emergent results were called by telephone at the time of interpretation on 11/01/2018 at 1:22 pm to Dr. Chaney Malling , who verbally acknowledged these results. Electronically Signed   By: Sebastian Ache M.D.   On: 11/01/2018 13:22   [name]  Result Date: 11/01/2018 CLINICAL DATA:  Altered mental status. Hyperglycemia. History of dementia. Fall 10 days ago. EXAM: MRI HEAD WITHOUT CONTRAST TECHNIQUE: Multiplanar, multiecho pulse sequences of the brain and surrounding structures were obtained without intravenous contrast. COMPARISON:  Head CT 11/01/2018 and MRI 06/16/2015 FINDINGS: The patient was unable to tolerate the complete examination. Axial and coronal diffusion, sagittal T1, and axial T2 sequences were obtained. The axial T2 sequence is moderately motion degraded, and there is mild motion on the diffusion imaging. Brain: There is a 2 cm region of abnormal trace diffusion signal with normal to mildly reduced ADC involving left frontoparietal cortex at the vertex suggesting an acute to early subacute infarct. Two additional punctate foci of abnormal diffusion signal are noted in the left frontal lobe along the margin of the interhemispheric cyst and in the left insula and may also represent recent tiny infarcts although diffusion signal in the left frontal lobe corresponds to the questioned petechial hemorrhage on CT and could be artifactual. Minimal signal abnormality dependently in  the occipital horns of both lateral ventricles corresponds to the trace hemorrhage reported on earlier CT. Chronic changes including agenesis of the corpus callosum, a large inter hemispheric cyst extending left of midline, and cerebellar atrophy are again noted. There is colpocephaly with unchanged size and configuration of the lateral ventricles. Chronic cerebral white matter disease is incompletely evaluated. Vascular: Major intracranial arterial flow voids are grossly preserved. T2 hyperintensity in the non dominant left sigmoid sinus may reflect slow flow or less likely occlusion. Skull and upper cervical spine: No suspicious marrow lesion. Sinuses/Orbits: Grossly unremarkable orbits.  Clear sinuses. Other: Small right frontal scalp hematoma. IMPRESSION: 1. Incomplete, motion degraded examination. 2. 2 cm diffusion abnormality involving left frontoparietal cortex at the vertex which may reflect an acute/early subacute infarct or possibly contusion. 3. Possible additional punctate acute to early subacute infarcts in the left frontal lobe and left insula. 4. Trace intraventricular hemorrhage. 5. Chronic changes as above. Electronically Signed   By: Freida Busman  Mosetta Putt M.D.   On: 11/01/2018 14:10      Scheduled Meds: . amLODipine  2.5 mg Oral Daily  . carvedilol  3.125 mg Oral BID WC  . escitalopram  10 mg Oral Daily  . galantamine  16 mg Oral Q breakfast  . insulin aspart  0-15 Units Subcutaneous TID WC  . insulin aspart  0-5 Units Subcutaneous QHS  . insulin glargine  15 Units Subcutaneous QHS  . levETIRAcetam  500 mg Oral BID  . lisinopril  20 mg Oral Daily  . LORazepam  0.25 mg Oral QHS  . Melatonin  3 mg Oral QHS  . mirtazapine  30 mg Oral QHS  . pantoprazole  40 mg Oral Daily  . phenytoin  200 mg Oral Daily  . potassium chloride  40 mEq Oral BID WC  . risperiDONE  2 mg Oral QHS   Continuous Infusions: . magnesium sulfate 1 - 4 g bolus IVPB       LOS: 1 day    Time spent: 35 minutes    Noralee Stain, DO [facility] [email or web address] Password [facility] 11/03/2018, 9:36 AM

## 2018-11-03 NOTE — Progress Notes (Signed)
STROKE TEAM PROGRESS NOTE   HISTORY OF PRESENT ILLNESS (per record) Arthur Berry is an 72 y.o. male  With PMH, CKD, CAD, DM, CVA (06/2015) , epilepsy ( keppra, dilantin) who lives [facility] who [facility] AMS. Neuro consulted for infarcts on MRI   Patient had a recent fall 10/22/18, and head CT showed no hemorrhage. Suture was placed. He was sent to ED by nursing home for AMS and BG 500. Patient had not been eating well. Uses a walker at baseline to ambulate.   ED course:  BP: 166/82 BG: 374 ( on arrival) CTH: trace IVH hemorrhage, possible 3 mm petechial hemorrhage in left frontal lobe. MRI: 2 cm diffusion abnormality involving left frontoparietal cortex at the vertex which may reflect an acute/early subacute infarct or possibly contusion. Possible additional punctate acute to early subacute infarcts in the left frontal lobe and left insula.Trace intraventricular hemorrhage.  06/2015: punctate left MCA/PCA stroke. Patient has had seizures since 72 year old. Currently on dilantin and keppra ( good control). Dr. Marjory LiesPenumalli is outpatient neurologist. Last seen 08/14/17.  Date last known well: Date: 11/01/2018 Time last known well: Unable to determine tPA Given: No: outside of window  Modified Rankin: Rankin Score=2 NIHSS:3  SUBJECTIVE (INTERVAL HISTORY) Mental retardation with agitation currently.  Glucose was highly elevated on admission.  No witnessed seizures.  He did have a fall causing right frontal scalp contusion 10 days ago, which was resolved on imaging.   CT and MRI Brain reviewed personally.  There is congenital agenesis of corpus callosum, colpocephaly, and large left frontal cyst.  There are tiny petechial hemorrhage and/or ischemic changes which are not likely to be symptomatic.    OBJECTIVE Vitals:   11/02/18 1641 11/02/18 2133 11/03/18 0624 11/03/18 0726  BP: 137/83 140/79 (!) 141/76 (!) 151/86  Pulse: (!) 57 62 60 (!) 57  Resp: 18 18 18 18    Temp: 98.3 F (36.8 C) 97.6 F (36.4 C) 97.7 F (36.5 C) 97.9 F (36.6 C)  TempSrc: Oral Oral Oral Oral  SpO2: 97% 98% 98% 99%  Weight:  73.9 kg    Height:        CBC:  Recent Labs  Lab 11/01/18 1036 11/02/18 0507 11/03/18 0427  WBC 11.2* 12.4* 10.0  NEUTROABS 7.7  --   --   HGB 12.7* 11.4* 10.6*  HCT 40.5 34.2* 32.5*  MCV 100.0 97.7 95.0  PLT 150 137* 130*    Basic Metabolic Panel:  Recent Labs  Lab 11/02/18 0507 11/03/18 0427  NA 147* 138  K 3.4* 3.1*  CL 109 104  CO2 25 24  GLUCOSE 190* 301*  BUN 23 21  CREATININE 1.08 1.01  CALCIUM 8.5* 8.7*  MG  --  1.4*    Lipid Panel:     Component Value Date/Time   CHOL 101 06/16/2015 0212   TRIG 85 06/16/2015 0212   HDL 33 (L) 06/16/2015 0212   CHOLHDL 3.1 06/16/2015 0212   VLDL 17 06/16/2015 0212   LDLCALC 51 06/16/2015 0212   HgbA1c:  Lab Results  Component Value Date   HGBA1C 9.2 (H) 11/01/2018   Urine Drug Screen:     Component Value Date/Time   LABOPIA NONE DETECTED 05/13/2017 2045   COCAINSCRNUR NONE DETECTED 05/13/2017 2045   LABBENZ NONE DETECTED 05/13/2017 2045   AMPHETMU NONE DETECTED 05/13/2017 2045   THCU NONE DETECTED 05/13/2017 2045   LABBARB NONE DETECTED 05/13/2017 2045    Alcohol Level  Component Value Date/Time   ETH <5 05/13/2017 2100    IMAGING  Ct Head Wo Contrast 11/01/2018 IMPRESSION:  1. Trace intraventricular hemorrhage.  2. Possible 3 mm petechial hemorrhage in the left frontal lobe.  3. Decreased size of frontal scalp hematoma.    [name] 11/01/2018 IMPRESSION:  1. Incomplete, motion degraded examination.  2. 2 cm diffusion abnormality involving left frontoparietal cortex at the vertex which may reflect an acute/early subacute infarct or possibly contusion.  3. Possible additional punctate acute to early subacute infarcts in the left frontal lobe and left insula.  4. Trace intraventricular hemorrhage.  5. Chronic changes as above.       PHYSICAL EXAM Blood pressure (!) 151/86, pulse (!) 57, temperature 97.9 F (36.6 C), temperature source Oral, resp. rate 18, height 5\' 8"  (1.727 m), weight 73.9 kg, SpO2 99 %.   Mental retardation.   Unable to follow commands.  Non-focal exam.     HOME MEDICATIONS:    ASSESSMENT/PLAN Mr. Arthur BurySamuel W Sedlak is a 72 y.o. male with history of CKD, CAD, DM, CVA (06/2015) , epilepsy ( keppra, dilantin)  presenting with AMS. He did not receive IV t-PA due to late presentation.  Strokes:  Multiple possible infarcts - possibly embolic - source unknown  Resultant  Mental status changes  CT head - Trace intraventricular hemorrhage. Possible 3 mm petechial hemorrhage in the left frontal lobe.   MRI head -  2 cm diffusion abnormality involving left frontoparietal cortex at the vertex which may reflect an acute/early subacute infarct or possibly contusion. Possible additional punctate acute to early subacute infarcts in the left frontal lobe and left insula. Trace intraventricular hemorrhage.   MRA head - not performed  CTA H&N - not performed  Carotid Doppler  - not ordered  2D Echo  - not ordered  LDL - 51  HgbA1c - 9.2  Phenytoin level 8.9 (sub therapeutic)  UDS - not performed  VTE prophylaxis - SCDs  Diet - Carb modified with thin liquids  aspirin 325 mg daily prior to admission, now on No antithrombotic  Patient counseled to be compliant with his antithrombotic medications  Ongoing aggressive stroke risk factor management  Therapy recommendations:  SNF  Disposition:  Pending  Hypertension  Stable . Permissive hypertension (OK if < 220/120) but gradually normalize in 5-7 days . Long-term BP goal normotensive  Hyperlipidemia  Lipid lowering medication PTA:  none  LDL 51, goal < 70  Current lipid lowering medication: none  Continue statin at discharge  Diabetes  HgbA1c 9.2, goal < 7.0  Uncontrolled  Other Stroke Risk Factors  Advanced  age  Former cigarette smoker - quit  Hx stroke/TIA  Coronary artery disease  Obstructive sleep apnea  Other Problems  Hypokalemia  Anemia  Mentally deficient   A/P:  Mental status changes are not likely due to the tiny changes seen on imaging which are likely inconsequential and some may be artifactual in nature.  Mental status changes were likely due to severe hyperglycemia.  Unwitnessed seizures may have been a factor as well given his congenital neurological status.  I will check phenytoin and levetiracetam levels and adjust his dosing as needed.  He is on Tramadol and this is contraindicated in some with epilepsy as it can potentially lower the seizure threshold.  I will discontinue that.  He does not require any further stroke work up.    Weston SettleShervin Bary Limbach, MS, MD   To contact Stroke Continuity provider, please refer  to [email or web address] After hours, contact General Neurology

## 2018-11-04 ENCOUNTER — Inpatient Hospital Stay (HOSPITAL_COMMUNITY): Payer: Medicare Other

## 2018-11-04 DIAGNOSIS — R55 Syncope and collapse: Secondary | ICD-10-CM

## 2018-11-04 DIAGNOSIS — I629 Nontraumatic intracranial hemorrhage, unspecified: Secondary | ICD-10-CM

## 2018-11-04 LAB — BASIC METABOLIC PANEL
Anion gap: 8 (ref 5–15)
BUN: 14 mg/dL (ref 8–23)
CALCIUM: 9 mg/dL (ref 8.9–10.3)
CO2: 29 mmol/L (ref 22–32)
CREATININE: 0.93 mg/dL (ref 0.61–1.24)
Chloride: 106 mmol/L (ref 98–111)
GFR calc Af Amer: >60 mL/min (ref >60)
GFR calc non Af Amer: >60 mL/min (ref >60)
Glucose, Bld: 236 mg/dL — ABNORMAL HIGH (ref 70–99)
Potassium: 3.1 mmol/L — ABNORMAL LOW (ref 3.5–5.1)
Sodium: 143 mmol/L (ref 135–145)

## 2018-11-04 LAB — URINALYSIS, ROUTINE W REFLEX MICROSCOPIC
Glucose, UA: >=500 mg/dL — AB
Ketones, ur: NEGATIVE mg/dL
Nitrite: POSITIVE — AB
PROTEIN: 30 mg/dL — AB
Specific Gravity, Urine: 1.027 (ref 1.005–1.030)
WBC, UA: >50 WBC/hpf — ABNORMAL HIGH (ref 0–5)
pH: 5 (ref 5.0–8.0)

## 2018-11-04 LAB — GLUCOSE, CAPILLARY
Glucose-Capillary: 217 mg/dL — ABNORMAL HIGH (ref 70–99)
Glucose-Capillary: 212 mg/dL — ABNORMAL HIGH (ref 70–99)
Glucose-Capillary: 110 mg/dL — ABNORMAL HIGH (ref 70–99)
Glucose-Capillary: 120 mg/dL — ABNORMAL HIGH (ref 70–99)

## 2018-11-04 LAB — MAGNESIUM: Magnesium: 1.6 mg/dL — ABNORMAL LOW (ref 1.7–2.4)

## 2018-11-04 LAB — ECHOCARDIOGRAM COMPLETE
Height: 68 in
Weight: 2608 oz

## 2018-11-04 MED ORDER — PHENYTOIN SODIUM EXTENDED 100 MG PO CAPS
300.0000 mg | ORAL_CAPSULE | Freq: Every day | ORAL | Status: DC
  Administered 2018-11-05 – 2018-11-07 (×3): 300 mg via ORAL
  Filled 2018-11-04 (×3): qty 3

## 2018-11-04 MED ORDER — MAGNESIUM SULFATE 2 GM/50ML IV SOLN
2.0000 g | Freq: Once | INTRAVENOUS | Status: AC
  Administered 2018-11-04: 2 g via INTRAVENOUS
  Filled 2018-11-04: qty 50

## 2018-11-04 MED ORDER — PHENYTOIN SODIUM EXTENDED 100 MG PO CAPS
100.0000 mg | ORAL_CAPSULE | Freq: Once | ORAL | Status: AC
  Administered 2018-11-04: 100 mg via ORAL
  Filled 2018-11-04: qty 1

## 2018-11-04 MED ORDER — POTASSIUM CHLORIDE CRYS ER 20 MEQ PO TBCR
40.0000 meq | EXTENDED_RELEASE_TABLET | Freq: Two times a day (BID) | ORAL | Status: AC
  Administered 2018-11-04 (×2): 40 meq via ORAL
  Filled 2018-11-04 (×2): qty 2

## 2018-11-04 NOTE — Progress Notes (Signed)
  Echocardiogram 2D Echocardiogram has been performed.  Jomar Denz G Atanacio Melnyk 11/04/2018, 4:09 PM

## 2018-11-04 NOTE — Clinical Social Work Note (Addendum)
CSW talked with Arthur Berry in admissions with [facility] regarding patient. [name] asked if d/c plan was back to ALF from rehab and she was advised that this is the plan. CSW informed that they will review record and confirm benefits. CSW checked Epic HUB and [facility] has made a bed offer for patient. Call made to patient's brother, Arthur Berry [phone]) and message left. CSW will continue to follow and facilitate discharge to [facility] when patient medically stable.  Arthur Berry, MSW, LCSW Licensed Clinical Social Worker Clinical Social Work Department [facility] [phone]

## 2018-11-04 NOTE — Care Management Important Message (Signed)
Important Message  Patient Details  Name: Arthur Berry MRN: [id number] Date of Birth: 05/07/1946   Medicare Important Message Given:  Yes    Dorena BodoIris Jashon Ishida 11/04/2018, 4:19 PM

## 2018-11-04 NOTE — Evaluation (Signed)
Speech Language Pathology Evaluation Patient Details Name: Arthur Berry MRN: [id number] DOB: Mar 14, 1946 Today's Date: 11/04/2018 Time: 1410-1445 SLP Time Calculation (min) (ACUTE ONLY): 35 min  Problem List:  Patient Active Problem List   Diagnosis Date Noted  . Hypernatremia 11/01/2018  . Status post total replacement of right hip 11/12/2017  . Closed right hip fracture, initial encounter (HCC) 10/23/2017  . Closed subcapital fracture of right femur (HCC)   . Adjustment reaction with aggression 05/14/2017  . Type 2 diabetes mellitus with other circulatory complications (HCC) 06/19/2015  . Thrombocytopenia (HCC) 06/19/2015  . Cerebral infarction due to unspecified mechanism   . Dilantin toxicity 06/18/2015  . Phenytoin toxicity   . Stroke (HCC) 06/16/2015  . Fall 06/16/2015  . Epilepsy (HCC) 06/16/2015  . BPH (benign prostatic hyperplasia) 06/16/2015  . Leukocytosis 06/16/2015  . CVA (cerebral infarction) 06/16/2015  . CVA (cerebral vascular accident) (HCC) 06/15/2015  . GERD (gastroesophageal reflux disease) 04/23/2014  . Potassium (K) deficiency 12/11/2012  . Precordial pain 12/11/2012  . Dehydration 04/15/2012  . AKI (acute kidney injury) (HCC) 04/14/2012  . HLD (hyperlipidemia) 06/01/2010  . Essential hypertension, benign 06/01/2010  . DYSPNEA 06/01/2010  . Diabetes mellitus without complication (HCC) 11/04/2009  . RECTAL BLEEDING 11/04/2009  . PERSONAL HX COLONIC POLYPS 11/04/2009   Past Medical History:  Past Medical History:  Diagnosis Date  . Adjustment reaction with aggression 05/14/2017  . Arthritis   . Chronic kidney disease   . Colon polyp 2005   Tubular Adenoma  . Coronary artery disease, non-occlusive    Minimal nonobstructive CAD, nl LV systolic fxn by cath 12/11/12  . Diabetes mellitus 2001   TYPE 2  . Diverticulosis   . Epilepsy (HCC)   . GERD (gastroesophageal reflux disease) 04/23/2014  . Goiter, nontoxic, multinodular    Thyroid US 11/2012  .  Hyperlipidemia 1999  . Hypertension 1999  . Internal hemorrhoids   . Neuromuscular disorder (HCC)   . OSA (obstructive sleep apnea)   . Prostate enlargement   . Sleep apnea   . Splinter 04/27/13   metal plinter removed rt pointer finger  . Stroke (HCC) 06/2015  . Vision changes 06/2015   since CVA   Past Surgical History:  Past Surgical History:  Procedure Laterality Date  . CHOLECYSTECTOMY    . COLONOSCOPY  12/28/2009  . ESOPHAGOGASTRODUODENOSCOPY  10/14/2012   normal   . INGUINAL HERNIA REPAIR  06/27/11   left  . KNEE SURGERY  2008   Left  . LEFT HEART CATHETERIZATION WITH CORONARY ANGIOGRAM N/A 12/11/2012   Procedure: LEFT HEART CATHETERIZATION WITH CORONARY ANGIOGRAM;  Surgeon: Tonny BollmanMichael Cooper, MD;  [facility];  Service: Cardiovascular;  Laterality: N/A;  . TOTAL HIP ARTHROPLASTY Right 10/23/2017   Procedure: TOTAL HIP ARTHROPLASTY ANTERIOR APPROACH;  Surgeon: Kathryne HitchBlackman, Christopher Y, MD;  Location: [facility] OR;  Service: Orthopedics;  Laterality: Right;   HPI:  amuel W Berry is a 72 y.o. male with past medical history significant for slight confusion CKD, CAD, diabetes mellitus, history of epilepsy, stroke who [facility]. Patient had a recent falls resulting in head injury (sutures required) with CT negative for intracranial bleeding.  He came to ED for AMS and reduced appetite. No history of swallowing problems, but baseline cognitive deficits.    Assessment / Plan / Recommendation Clinical Impression  Patient has a past medical history significant for slight confusion and underlying dementia. No family present to confirm baseline deficits. Evaluation impacted by patient being very  tired and wanting a nap. MOCA was completed with a score of 15/30 (>26/30 is WNL). Areas of relative weakness are memory, problem solving and attention. Patient's accuracy increases with open ended questions and repitition. His relative strengths were naming and writing. Speech therapy to  follow up to continue assessment and help maximize patient rehab.    SLP Assessment  SLP Recommendation/Assessment: Patient needs continued Speech Lanaguage Pathology Services SLP Visit Diagnosis: Cognitive communication deficit (R41.841)    Follow Up Recommendations  Skilled Nursing facility    Frequency and Duration min 2x/week  2 weeks      SLP Evaluation Cognition  Overall Cognitive Status: History of cognitive impairments - at baseline Arousal/Alertness: Awake/alert Orientation Level: Oriented to person;Oriented to place;Disoriented to time Attention: Focused Focused Attention: Impaired Focused Attention Impairment: Verbal basic Memory: Impaired Memory Impairment: Storage deficit Awareness: Impaired Awareness Impairment: Intellectual impairment Problem Solving: Impaired Problem Solving Impairment: Functional basic Safety/Judgment: Impaired       Comprehension  Auditory Comprehension Overall Auditory Comprehension: Impaired Yes/No Questions: Within Functional Limits Commands: Impaired Two Step Basic Commands: 50-74% accurate Conversation: Simple Interfering Components: Attention;Processing speed;Working Radio broadcast assistantmemory EffectiveTechniques: Wellsite geologistxtra processing time;Repetition;Visual/Gestural cues Visual Recognition/Discrimination Discrimination: Not tested Reading Comprehension Reading Status: Not tested    Expression Expression Primary Mode of Expression: Verbal Verbal Expression Overall Verbal Expression: Appears within functional limits for tasks assessed Initiation: No impairment Level of Generative/Spontaneous Verbalization: Phrase Repetition: No impairment Naming: No impairment Pragmatics: No impairment Interfering Components: Attention;Premorbid deficit Effective Techniques: Open ended questions;Articulatory cues Written Expression Dominant Hand: Left Written Expression: Not tested   Oral / Motor  Oral Motor/Sensory Function Overall Oral Motor/Sensory  Function: Mild impairment Facial ROM: Reduced right;Reduced left Facial Symmetry: Within Functional Limits Facial Strength: Within Functional Limits Facial Sensation: Within Functional Limits Lingual ROM: Reduced right;Reduced left Lingual Symmetry: Within Functional Limits Lingual Strength: Reduced Lingual Sensation: Within Functional Limits Velum: Within Functional Limits Mandible: Within Functional Limits Motor Speech Respiration: Within functional limits Phonation: Normal Resonance: Within functional limits Articulation: Within functional limitis Intelligibility: Intelligible Motor Planning: Witnin functional limits Motor Speech Errors: Not applicable   GO                   Lindalou HoseSarah J. Tonja Jezewski, MA, CCC-SLP 11/04/2018 3:21 PM

## 2018-11-04 NOTE — Progress Notes (Signed)
STROKE TEAM PROGRESS NOTE    SUBJECTIVE (INTERVAL HISTORY) Patient up in chair. Nonverbal. No family at bedside.   OBJECTIVE Vitals:   11/03/18 1711 11/03/18 2117 11/04/18 0518 11/04/18 0730  BP: (!) 156/78 (!) 179/95 (!) 148/88 (!) 141/68  Pulse: 87 86 80 74  Resp: 18 18 18 18   Temp: 99.4 F (37.4 C) 98.9 F (37.2 C) 98.8 F (37.1 C) 98.9 F (37.2 C)  TempSrc: Oral Oral Oral Oral  SpO2: 96%  99% 98%  Weight:      Height:        CBC:  Recent Labs  Lab 11/01/18 1036 11/02/18 0507 11/03/18 0427  WBC 11.2* 12.4* 10.0  NEUTROABS 7.7  --   --   HGB 12.7* 11.4* 10.6*  HCT 40.5 34.2* 32.5*  MCV 100.0 97.7 95.0  PLT 150 137* 130*    Basic Metabolic Panel:  Recent Labs  Lab 11/03/18 0427 11/04/18 0450  NA 138 143  K 3.1* 3.1*  CL 104 106  CO2 24 29  GLUCOSE 301* 236*  BUN 21 14  CREATININE 1.01 0.93  CALCIUM 8.7* 9.0  MG 1.4* 1.6*    Lipid Panel:     Component Value Date/Time   CHOL 101 06/16/2015 0212   TRIG 85 06/16/2015 0212   HDL 33 (L) 06/16/2015 0212   CHOLHDL 3.1 06/16/2015 0212   VLDL 17 06/16/2015 0212   LDLCALC 51 06/16/2015 0212   HgbA1c:  Lab Results  Component Value Date   HGBA1C 9.2 (H) 11/01/2018   Urine Drug Screen:     Component Value Date/Time   LABOPIA NONE DETECTED 05/13/2017 2045   COCAINSCRNUR NONE DETECTED 05/13/2017 2045   LABBENZ NONE DETECTED 05/13/2017 2045   AMPHETMU NONE DETECTED 05/13/2017 2045   THCU NONE DETECTED 05/13/2017 2045   LABBARB NONE DETECTED 05/13/2017 2045    Alcohol Level     Component Value Date/Time   ETH <5 05/13/2017 2100    IMAGING  Ct Head Wo Contrast 11/01/2018 IMPRESSION:  1. Trace intraventricular hemorrhage.  2. Possible 3 mm petechial hemorrhage in the left frontal lobe.  3. Decreased size of frontal scalp hematoma.   [name] 11/01/2018 IMPRESSION:  1. Incomplete, motion degraded examination.  2. 2 cm diffusion abnormality involving left frontoparietal cortex at  the vertex which may reflect an acute/early subacute infarct or possibly contusion.  3. Possible additional punctate acute to early subacute infarcts in the left frontal lobe and left insula.  4. Trace intraventricular hemorrhage.  5. Chronic changes as above.   No results found.    PHYSICAL EXAM Exam: NAD, drowsy but arousable, non-cooperative, MR Speech:   Globally aphasic  Cognition: Nonverbal, difficult to assess, he has a hx of mental retardation   Cranial Nerves:    The pupils are equal, round, and reactive to light.Trigeminal sensation is intact and the muscles of mastication are normal. The face is symmetric. Hearing appears intact to voice. nonverbal.   Coordination:  No apparent dysmetria  Motor Observation:    No asymmetry, no atrophy, and no involuntary movements noted. Tone:    Normal muscle tone.     Strength:  cannot assess strength or sensation, patient uncooperative       ASSESSMENT/PLAN [name] is a 72 y.o. male with history of CKD, CAD, DM, CVA (06/2015) , epilepsy ( keppra, dilantin)  presenting with AMS. He did not receive IV t-PA due to late presentation.  Acute metabolic encephalopathy  Not related  to stroke dx  Recurrent Fall w/ contusion  Contusion seen on MRI w/ IVH  Hx Epilepsy, Unwitnessed Seizure  On keppra and dilatin  Increased dilantin to 300 daily due to low level   Follow dilatin levels daily and adjust as needed  Strokes:  Incidental L frontal and L insular cortical infarcts with trace IVH - embolic - source unknown  CT head - Trace intraventricular hemorrhage. Possible 3 mm petechial hemorrhage in the left frontal lobe.   MRI head -  2 cm diffusion abnormality involving left frontoparietal cortex at the vertex which may reflect an acute/early subacute infarct or possibly contusion. Possible additional punctate acute to early subacute infarcts in the left frontal lobe and left insula. Trace intraventricular hemorrhage.    CTA H&N - pending   2D Echo  - .pending   LDL - 51  HgbA1c - 9.2  UDS - not performed  VTE prophylaxis - SCDs  aspirin 325 mg daily prior to admission, now on No antithrombotic. Off aspirin d/t IVH. Repeat CT head  In 1 week and consider resuming aspirin.   Therapy recommendations:  SNF  Disposition:  Pending  Will complete stroke workup in the event we can prevent future strokes with medical management  Would not pursue further embolic workup given that pt is not an AC candidate  Hypertension  Stable - at goal  SBP goal < 160 . BP goal normotensive  Hyperlipidemia  Lipid lowering medication PTA:  none  LDL 51, goal < 70  Current lipid lowering medication: none  Continue statin at discharge  Diabetes  HgbA1c 9.2, goal < 7.0  Uncontrolled  Other Stroke Risk Factors  Advanced age  Former cigarette smoker - quit  Hx stroke/TIA  Coronary artery disease  Obstructive sleep apnea  Other Problems  Hypokalemia  Anemia  Mentally deficient  This is a 72 year old patient with a history of mental retardation, seizures, diabetes, chronic kidney disease presented with altered mental status and a fall several days prior.  His acute metabolic encephalopathy not related to stroke but incidental left frontal and left insular cortical infarcts with trace intraventricular hemorrhage were seen likely embolic source unknown.  At this time will complete the stroke work-up but he is not a good candidate for anticoagulation so we will not proceed with TEE or loop.  Personally examined patient and images, and have participated in and made any corrections needed to history, physical, neuro exam,assessment and plan as stated above.  I have personally obtained the history, evaluated lab date, reviewed imaging studies and agree with radiology interpretations.    Naomie DeanAntonia Ahern, MD Stroke Neurology   A total of 25 minutes was spent for the care of this patient, spent on  deciding diagnostic and therapeutic options, counseling and coordination of care, riskd ans benefits of management, compliance, or risk factor reduction and education.    To contact Stroke Continuity provider, please refer to [email or web address]. After hours, contact General Neurology

## 2018-11-04 NOTE — Progress Notes (Addendum)
PROGRESS NOTE    Arthur Berry  UJW:[id number] DOB: November 01, 1946 DOA: 11/01/2018 PCP: Rodrigo RanPerini, Mark, MD     Brief Narrative:  Arthur Berry is a 72 y.o. male with past medical history significant for slight confusion and underlying dementia, CKD, CAD, diabetes mellitus, history of epilepsy, stroke who lives [facility].  He had a recent head injury 12/10 from fall and had CT head that showed no intracranial bleeding and had suture placed.  He was again sent from nursing home due to uncontrolled blood sugar in 500, worsening of his mental status, "patient was not being himself".  Apparently patient has not been eating well.  There was no reported recent falls since the last one.  Imaging in the emergency department revealed trace intraventricular hemorrhage, acute/subacute infarct.  New events last 24 hours / Subjective: No acute events overnight, states that he is tired.  No other complaints this morning  Assessment & Plan:   Active Problems:   HLD (hyperlipidemia)   Dehydration   Stroke (HCC)   Fall   Type 2 diabetes mellitus with other circulatory complications (HCC)   Hypernatremia   Acute metabolic encephalopathy Appears to be back to his baseline  Recurrent falls PT OT, rec SNF   Left frontal lobe and left insular punctate infarcts  MRI brain showed 2cm diffuse abnormality left frontoparietal cortex possible acute/early subacute infarct or possibly contusion, possible punctate acute/subacute infarct left frontal lobe and left insula  Neurology consulted and following  Hold aspirin PT OT SLP   Intraventricular hemorrhage On-call neurosurgeon does not feel it is clinically significant, had no other neurosurgical interventions offered at this time Hold aspirin  Hypernatremia Suspect secondary to dehydration, poor oral intake Resolved Encourage PO intake   Diabetes mellitus type 2 with uncontrolled hyperglycemia Hemoglobin A1c 9.2  Stop glipizide, concerned about  hypoglycemic episodes with poor PO intake at ALF and frequent falls  Lantus, SSI   HTN Continue norvasc, coreg, lisinopril   History of epilepsy Continue Dilantin, Keppra  Mood disorder Continue Lexapro, galantamine, Ativan, Remeron, Risperdal  Hypokalemia Replace, trend   Hypomagnesemia Replace, trend    DVT prophylaxis: SCD Code Status: Full Family Communication: No family at bedside Disposition Plan: SNF when bed available for discharge    Consultants:   Neurology  Procedures:   None   Antimicrobials:  Anti-infectives (From admission, onward)   None       Objective: Vitals:   11/03/18 1711 11/03/18 2117 11/04/18 0518 11/04/18 0730  BP: (!) 156/78 (!) 179/95 (!) 148/88 (!) 141/68  Pulse: 87 86 80 74  Resp: 18 18 18 18   Temp: 99.4 F (37.4 C) 98.9 F (37.2 C) 98.8 F (37.1 C) 98.9 F (37.2 C)  TempSrc: Oral Oral Oral Oral  SpO2: 96%  99% 98%  Weight:      Height:        Intake/Output Summary (Last 24 hours) at 11/04/2018 1028 Last data filed at 11/04/2018 [id number] Gross per 24 hour  Intake 840 ml  Output 1652 ml  Net -812 ml   Filed Weights   11/01/18 2200 11/02/18 2133  Weight: 90.7 kg 73.9 kg     Examination: General exam: Appears calm and comfortable  Respiratory system: Clear to auscultation. Respiratory effort normal. Cardiovascular system: S1 & S2 heard, RRR. No JVD, murmurs, rubs, gallops or clicks. No pedal edema. Gastrointestinal system: Abdomen is nondistended, soft and nontender. No organomegaly or masses felt. Normal bowel sounds heard. Central nervous system: Alert and  oriented Extremities: Symmetric  Skin: No rashes, lesions or ulcers Psychiatry: Judgement and insight appear stable    Data Reviewed: I have personally reviewed following labs and imaging studies  CBC: Recent Labs  Lab 11/01/18 1036 11/02/18 0507 11/03/18 0427  WBC 11.2* 12.4* 10.0  NEUTROABS 7.7  --   --   HGB 12.7* 11.4* 10.6*  HCT 40.5 34.2* 32.5*   MCV 100.0 97.7 95.0  PLT 150 137* 130*   Basic Metabolic Panel: Recent Labs  Lab 11/01/18 1036 11/02/18 0507 11/03/18 0427 11/04/18 0450  NA 151* 147* 138 143  K 4.0 3.4* 3.1* 3.1*  CL 113* 109 104 106  CO2 26 25 24 29   GLUCOSE 425* 190* 301* 236*  BUN 34* 23 21 14   CREATININE 1.23 1.08 1.01 0.93  CALCIUM 9.7 8.5* 8.7* 9.0  MG  --   --  1.4* 1.6*   GFR: Estimated Creatinine Clearance: 69.5 mL/min (by C-G formula based on SCr of 0.93 mg/dL). Liver Function Tests: Recent Labs  Lab 11/01/18 1036  AST 12*  ALT 16  ALKPHOS 162*  BILITOT 0.6  PROT 7.0  ALBUMIN 3.7   No results for input(s): LIPASE, AMYLASE in the last 168 hours. No results for input(s): AMMONIA in the last 168 hours. Coagulation Profile: No results for input(s): INR, PROTIME in the last 168 hours. Cardiac Enzymes: No results for input(s): CKTOTAL, CKMB, CKMBINDEX, TROPONINI in the last 168 hours. BNP (last 3 results) No results for input(s): PROBNP in the last 8760 hours. HbA1C: Recent Labs    11/01/18 1649  HGBA1C 9.2*   CBG: Recent Labs  Lab 11/03/18 0727 11/03/18 1151 11/03/18 1713 11/03/18 2119 11/04/18 0727  GLUCAP 268* 283* 239* 261* 217*   Lipid Profile: No results for input(s): CHOL, HDL, LDLCALC, TRIG, CHOLHDL, LDLDIRECT in the last 72 hours. Thyroid Function Tests: No results for input(s): TSH, T4TOTAL, FREET4, T3FREE, THYROIDAB in the last 72 hours. Anemia Panel: No results for input(s): VITAMINB12, FOLATE, FERRITIN, TIBC, IRON, RETICCTPCT in the last 72 hours. Sepsis Labs: No results for input(s): PROCALCITON, LATICACIDVEN in the last 168 hours.  Recent Results (from the past 240 hour(s))  MRSA PCR Screening     Status: None   Collection Time: 11/01/18  5:15 PM  Result Value Ref Range Status   MRSA by PCR NEGATIVE NEGATIVE Final    Comment:        The GeneXpert MRSA Assay (FDA approved for NASAL specimens only), is one component of a comprehensive MRSA  colonization surveillance program. It is not intended to diagnose MRSA infection nor to guide or monitor treatment for MRSA infections. Performed at [facility], [address] [place], [place] [address]        Radiology Studies: No results found.    Scheduled Meds: . amLODipine  2.5 mg Oral Daily  . carvedilol  3.125 mg Oral BID WC  . escitalopram  10 mg Oral Daily  . galantamine  16 mg Oral Q breakfast  . insulin aspart  0-15 Units Subcutaneous TID WC  . insulin aspart  0-5 Units Subcutaneous QHS  . insulin glargine  15 Units Subcutaneous QHS  . levETIRAcetam  500 mg Oral BID  . lisinopril  20 mg Oral Daily  . LORazepam  0.25 mg Oral QHS  . Melatonin  3 mg Oral QHS  . mirtazapine  30 mg Oral QHS  . pantoprazole  40 mg Oral Daily  . phenytoin  200 mg Oral Daily  .  potassium chloride  40 mEq Oral BID  . risperiDONE  2 mg Oral QHS   Continuous Infusions: . magnesium sulfate 1 - 4 g bolus IVPB       LOS: 2 days    Time spent: 20 minutes   Noralee Stain, DO [facility] [email or web address] Password [facility] 11/04/2018, 10:28 AM

## 2018-11-05 ENCOUNTER — Inpatient Hospital Stay (HOSPITAL_COMMUNITY): Payer: Medicare Other

## 2018-11-05 DIAGNOSIS — G9341 Metabolic encephalopathy: Secondary | ICD-10-CM

## 2018-11-05 LAB — BASIC METABOLIC PANEL
Anion gap: 9 (ref 5–15)
BUN: 15 mg/dL (ref 8–23)
CHLORIDE: 106 mmol/L (ref 98–111)
CO2: 28 mmol/L (ref 22–32)
Calcium: 8.7 mg/dL — ABNORMAL LOW (ref 8.9–10.3)
Creatinine, Ser: 0.98 mg/dL (ref 0.61–1.24)
GFR calc Af Amer: >60 mL/min (ref >60)
GFR calc non Af Amer: >60 mL/min (ref >60)
Glucose, Bld: 123 mg/dL — ABNORMAL HIGH (ref 70–99)
Potassium: 3.3 mmol/L — ABNORMAL LOW (ref 3.5–5.1)
Sodium: 143 mmol/L (ref 135–145)

## 2018-11-05 LAB — GLUCOSE, CAPILLARY
Glucose-Capillary: 115 mg/dL — ABNORMAL HIGH (ref 70–99)
Glucose-Capillary: 181 mg/dL — ABNORMAL HIGH (ref 70–99)
Glucose-Capillary: 202 mg/dL — ABNORMAL HIGH (ref 70–99)
Glucose-Capillary: 194 mg/dL — ABNORMAL HIGH (ref 70–99)

## 2018-11-05 LAB — MAGNESIUM: Magnesium: 1.7 mg/dL (ref 1.7–2.4)

## 2018-11-05 LAB — LEVETIRACETAM LEVEL: Levetiracetam Lvl: 9.5 ug/mL — ABNORMAL LOW (ref 10.0–40.0)

## 2018-11-05 MED ORDER — POTASSIUM CHLORIDE CRYS ER 20 MEQ PO TBCR
40.0000 meq | EXTENDED_RELEASE_TABLET | Freq: Once | ORAL | Status: AC
  Administered 2018-11-05: 40 meq via ORAL
  Filled 2018-11-05: qty 2

## 2018-11-05 MED ORDER — SODIUM CHLORIDE 0.9 % IV SOLN
1.0000 g | Freq: Every day | INTRAVENOUS | Status: DC
  Administered 2018-11-05 – 2018-11-06 (×2): 1 g via INTRAVENOUS
  Filled 2018-11-05 (×2): qty 10

## 2018-11-05 MED ORDER — IOPAMIDOL (ISOVUE-370) INJECTION 76%
INTRAVENOUS | Status: AC
  Filled 2018-11-05: qty 100

## 2018-11-05 MED ORDER — IOPAMIDOL (ISOVUE-370) INJECTION 76%
75.0000 mL | Freq: Once | INTRAVENOUS | Status: AC | PRN
  Administered 2018-11-05: 75 mL via INTRAVENOUS

## 2018-11-05 NOTE — Progress Notes (Signed)
PROGRESS NOTE    Arthur Berry  UJW:[id number] DOB: 1946/02/09 DOA: 11/01/2018 PCP: Rodrigo Ran, MD     Brief Narrative:  Arthur Berry is a 72 y.o. male with past medical history significant for slight confusion and underlying dementia, CKD, CAD, diabetes mellitus, history of epilepsy, stroke who lives at [facility]  He had a recent head injury 12/10 from fall and had CT head that showed no intracranial bleeding and had suture placed.  He was again sent from nursing home due to uncontrolled blood sugar in 500, worsening of his mental status, "patient was not being himself".  Apparently patient has not been eating well.  There was no reported recent falls since the last one.  Imaging in the emergency department revealed trace intraventricular hemorrhage, acute/subacute infarct.  New events last 24 hours / Subjective: Complains of burning with urination  Assessment & Plan:   Active Problems:   HLD (hyperlipidemia)   Dehydration   Stroke (HCC)   Fall   Type 2 diabetes mellitus with other circulatory complications (HCC)   Hypernatremia   Acute metabolic encephalopathy Appears to be back to his baseline  Recurrent falls PT OT, rec SNF   Left frontal lobe and left insular punctate infarcts  MRI brain showed 2cm diffuse abnormality left frontoparietal cortex possible acute/early subacute infarct or possibly contusion, possible punctate acute/subacute infarct left frontal lobe and left insula  CTA head/neck showed no hemodynamically significant stenosis or large vessel occlusion. Showed severe stenosis left A1 and right P3 segments  Neurology consulted and following  Hold aspirin PT OT SLP   Intraventricular hemorrhage On-call neurosurgeon does not feel it is clinically significant, had no other neurosurgical interventions offered at this time Hold aspirin  Hypernatremia Suspect secondary to dehydration, poor oral intake Resolved Encourage PO intake   Diabetes mellitus  type 2 with uncontrolled hyperglycemia Hemoglobin A1c 9.2  Stop glipizide, concerned about hypoglycemic episodes with poor PO intake at ALF and frequent falls  Lantus, SSI   HTN Continue norvasc, coreg, lisinopril   History of epilepsy Continue Dilantin, Keppra  Mood disorder Continue Lexapro, galantamine, Ativan, Remeron, Risperdal  Hypokalemia Replace, trend   UTI Rocephin, urine culture showing GNR    DVT prophylaxis: SCD Code Status: Full Family Communication: No family at bedside, spoke with sister over the phone Disposition Plan: SNF when bed available for discharge, likely 12/26 due to SNF pharmacy not being open on 12/25    Consultants:   Neurology  Procedures:   None   Antimicrobials:  Anti-infectives (From admission, onward)   Start     Dose/Rate Route Frequency Ordered Stop   11/05/18 0900  cefTRIAXone (ROCEPHIN) 1 g in sodium chloride 0.9 % 100 mL IVPB     1 g 200 mL/hr over 30 Minutes Intravenous Daily 11/05/18 0739         Objective: Vitals:   11/04/18 1625 11/04/18 2103 11/05/18 0532 11/05/18 0911  BP: 139/79 110/72 (!) 147/89 132/72  Pulse: 79 68 66 72  Resp: 18 18 18  (!) 22  Temp: 98.5 F (36.9 C) 98.8 F (37.1 C) 98.9 F (37.2 C) 98.1 F (36.7 C)  TempSrc: Oral Oral  Oral  SpO2: 98% 98% 100% 100%  Weight:      Height:        Intake/Output Summary (Last 24 hours) at 11/05/2018 1115 Last data filed at 11/05/2018 0900 Gross per 24 hour  Intake 170 ml  Output 800 ml  Net -630 ml   Filed  Weights   11/01/18 2200 11/02/18 2133  Weight: 90.7 kg 73.9 kg    Examination: General exam: Appears calm and comfortable  Respiratory system: Clear to auscultation. Respiratory effort normal. Cardiovascular system: S1 & S2 heard, RRR. No JVD, murmurs, rubs, gallops or clicks. No pedal edema. Gastrointestinal system: Abdomen is nondistended, soft and nontender. No organomegaly or masses felt. Normal bowel sounds heard. Central nervous system:  Alert and oriented. No new focal neurological deficits. Extremities: Symmetric 5 x 5 power. Skin: No rashes, lesions or ulcers Psychiatry: Judgement and insight appear stable. Mood & affect appropriate.    Data Reviewed: I have personally reviewed following labs and imaging studies  CBC: Recent Labs  Lab 11/01/18 1036 11/02/18 0507 11/03/18 0427  WBC 11.2* 12.4* 10.0  NEUTROABS 7.7  --   --   HGB 12.7* 11.4* 10.6*  HCT 40.5 34.2* 32.5*  MCV 100.0 97.7 95.0  PLT 150 137* 130*   Basic Metabolic Panel: Recent Labs  Lab 11/01/18 1036 11/02/18 0507 11/03/18 0427 11/04/18 0450 11/05/18 0518  NA 151* 147* 138 143 143  K 4.0 3.4* 3.1* 3.1* 3.3*  CL 113* 109 104 106 106  CO2 26 25 24 29 28   GLUCOSE 425* 190* 301* 236* 123*  BUN 34* 23 21 14 15   CREATININE 1.23 1.08 1.01 0.93 0.98  CALCIUM 9.7 8.5* 8.7* 9.0 8.7*  MG  --   --  1.4* 1.6* 1.7   GFR: Estimated Creatinine Clearance: 65.9 mL/min (by C-G formula based on SCr of 0.98 mg/dL). Liver Function Tests: Recent Labs  Lab 11/01/18 1036  AST 12*  ALT 16  ALKPHOS 162*  BILITOT 0.6  PROT 7.0  ALBUMIN 3.7   No results for input(s): LIPASE, AMYLASE in the last 168 hours. No results for input(s): AMMONIA in the last 168 hours. Coagulation Profile: No results for input(s): INR, PROTIME in the last 168 hours. Cardiac Enzymes: No results for input(s): CKTOTAL, CKMB, CKMBINDEX, TROPONINI in the last 168 hours. BNP (last 3 results) No results for input(s): PROBNP in the last 8760 hours. HbA1C: No results for input(s): HGBA1C in the last 72 hours. CBG: Recent Labs  Lab 11/04/18 0727 11/04/18 1053 11/04/18 1621 11/04/18 2103 11/05/18 0746  GLUCAP 217* 212* 110* 120* 115*   Lipid Profile: No results for input(s): CHOL, HDL, LDLCALC, TRIG, CHOLHDL, LDLDIRECT in the last 72 hours. Thyroid Function Tests: No results for input(s): TSH, T4TOTAL, FREET4, T3FREE, THYROIDAB in the last 72 hours. Anemia Panel: No results  for input(s): VITAMINB12, FOLATE, FERRITIN, TIBC, IRON, RETICCTPCT in the last 72 hours. Sepsis Labs: No results for input(s): PROCALCITON, LATICACIDVEN in the last 168 hours.  Recent Results (from the past 240 hour(s))  MRSA PCR Screening     Status: None   Collection Time: 11/01/18  5:15 PM  Result Value Ref Range Status   MRSA by PCR NEGATIVE NEGATIVE Final    Comment:        The GeneXpert MRSA Assay (FDA approved for NASAL specimens only), is one component of a comprehensive MRSA colonization surveillance program. It is not intended to diagnose MRSA infection nor to guide or monitor treatment for MRSA infections. Performed at [facility], [address] [place], [place] [address]   Culture, Urine     Status: Abnormal (Preliminary result)   Collection Time: 11/04/18  6:40 PM  Result Value Ref Range Status   Specimen Description URINE, CATHETERIZED  Final   Special Requests   Final  NONE Performed at [facility], [address] [place], [place] [address]    Culture >=100,000 COLONIES/mL GRAM NEGATIVE RODS (A)  Final   Report Status PENDING  Incomplete       Radiology Studies: Ct Angio Head W Or Wo Contrast  Result Date: 11/05/2018 CLINICAL DATA:  Vision changes, follow up stroke. History of stroke, hypertension, seizures, hypertension, hyperlipidemia. EXAM: CT ANGIOGRAPHY HEAD AND NECK TECHNIQUE: Multidetector CT imaging of the head and neck was performed using the standard protocol during bolus administration of intravenous contrast. Multiplanar CT image reconstructions and MIPs were obtained to evaluate the vascular anatomy. Carotid stenosis measurements (when applicable) are obtained utilizing NASCET criteria, using the distal internal carotid diameter as the denominator. CONTRAST:  75mL ISOVUE-370 IOPAMIDOL (ISOVUE-370) INJECTION 76% COMPARISON:  MRI head November 01, 2018 and CT HEAD November 01, 2018 and CT angiogram head and neck osseous fourth 2016  FINDINGS: CT HEAD FINDINGS BRAIN: No intraparenchymal hemorrhage, mass effect nor midline shift. Severe ventriculomegaly with agenesis of corpus callosum and colpocephaly. Minimal dependent blood products within occipital horns. LEFT para median cyst in additional to suspected RIGHT mesial frontal encephalomalacia versus schizencephaly. Periventricular white matter hypodensities seen with gliosis, chronic small vessel ischemic changes and/or interstitial edema. Moderate cerebellar atrophy. No midline shift. No acute large vascular territory infarcts. VASCULAR: Moderate calcific atherosclerosis of the carotid siphons. SKULL: No skull fracture. No significant scalp soft tissue swelling. SINUSES/ORBITS: Mild paranasal sinus mucosal thickening. Mastoid air cells are well aerated.The included ocular globes and orbital contents are non-suspicious. OTHER: None. CTA NECK FINDINGS: AORTIC ARCH: Normal appearance of the thoracic arch, normal branch pattern. Mild calcific atherosclerosis aortic arch. The origins of the innominate, left Common carotid artery and subclavian artery are widely patent. RIGHT CAROTID SYSTEM: Common carotid artery is patent. Mild calcific atherosclerosis of the carotid bifurcation without hemodynamically significant stenosis by NASCET criteria. Normal appearance of the internal carotid artery. LEFT CAROTID SYSTEM: Common carotid artery is patent. Mild calcific atherosclerosis of the carotid bifurcation without hemodynamically significant stenosis by NASCET criteria. Patent common developmentally smaller. VERTEBRAL ARTERIES:RIGHT vertebral artery is dominant. Normal appearance of the vertebral arteries, widely patent. SKELETON: No acute osseous process though bone windows have not been submitted. Moderate to severe cervical spondylosis. Severe C3-4 through C6-7 neural foraminal narrowing. OTHER NECK: Soft tissues of the neck are nonacute though, not tailored for evaluation. LEFT > RIGHT thyromegaly  without dominant nodule. UPPER CHEST: Included lung apices are clear. No superior mediastinal lymphadenopathy. CTA HEAD FINDINGS: ANTERIOR CIRCULATION: Patent cervical internal carotid arteries, petrous, cavernous and supra clinoid internal carotid arteries. Patent anterior communicating artery. Patent anterior and middle cerebral arteries. Anterior cerebral artery splayed by interhemispheric cyst. Severe stenosis LEFT A3 segment. No large vessel occlusion, significant stenosis, contrast extravasation or aneurysm. POSTERIOR CIRCULATION: Patent vertebral arteries, vertebrobasilar junction and basilar artery, as well as main branch vessels. Patent posterior cerebral arteries. Moderate tandem stenosis RIGHT PCA, severe stenosis RIGHT P3 segment. Moderate tandem stenosis LEFT PCA. No large vessel occlusion, significant stenosis, contrast extravasation or aneurysm. VENOUS SINUSES: Major dural venous sinuses are patent though not tailored for evaluation on this angiographic examination. ANATOMIC VARIANTS: None. DELAYED PHASE: No abnormal intracranial enhancement. MIP images reviewed. IMPRESSION: CT HEAD: 1. No acute intracranial process. 2. Trace residual intraventricular blood products. Stable ventriculomegaly. 3. Stable congenital anomalies. CTA NECK: 1. No hemodynamically significant stenosis ICA's. Patent vertebral arteries. 2. Multilevel severe neural foraminal narrowing. CTA HEAD: 1. No emergent large vessel occlusion. 2. Severe stenosis LEFT  A3 and RIGHT P3 segments. Electronically Signed   By: Awilda Metro M.D.   On: 11/05/2018 03:58   Ct Angio Neck W Or Wo Contrast  Result Date: 11/05/2018 CLINICAL DATA:  Vision changes, follow up stroke. History of stroke, hypertension, seizures, hypertension, hyperlipidemia. EXAM: CT ANGIOGRAPHY HEAD AND NECK TECHNIQUE: Multidetector CT imaging of the head and neck was performed using the standard protocol during bolus administration of intravenous contrast.  Multiplanar CT image reconstructions and MIPs were obtained to evaluate the vascular anatomy. Carotid stenosis measurements (when applicable) are obtained utilizing NASCET criteria, using the distal internal carotid diameter as the denominator. CONTRAST:  75mL ISOVUE-370 IOPAMIDOL (ISOVUE-370) INJECTION 76% COMPARISON:  MRI head November 01, 2018 and CT HEAD November 01, 2018 and CT angiogram head and neck osseous fourth 2016 FINDINGS: CT HEAD FINDINGS BRAIN: No intraparenchymal hemorrhage, mass effect nor midline shift. Severe ventriculomegaly with agenesis of corpus callosum and colpocephaly. Minimal dependent blood products within occipital horns. LEFT para median cyst in additional to suspected RIGHT mesial frontal encephalomalacia versus schizencephaly. Periventricular white matter hypodensities seen with gliosis, chronic small vessel ischemic changes and/or interstitial edema. Moderate cerebellar atrophy. No midline shift. No acute large vascular territory infarcts. VASCULAR: Moderate calcific atherosclerosis of the carotid siphons. SKULL: No skull fracture. No significant scalp soft tissue swelling. SINUSES/ORBITS: Mild paranasal sinus mucosal thickening. Mastoid air cells are well aerated.The included ocular globes and orbital contents are non-suspicious. OTHER: None. CTA NECK FINDINGS: AORTIC ARCH: Normal appearance of the thoracic arch, normal branch pattern. Mild calcific atherosclerosis aortic arch. The origins of the innominate, left Common carotid artery and subclavian artery are widely patent. RIGHT CAROTID SYSTEM: Common carotid artery is patent. Mild calcific atherosclerosis of the carotid bifurcation without hemodynamically significant stenosis by NASCET criteria. Normal appearance of the internal carotid artery. LEFT CAROTID SYSTEM: Common carotid artery is patent. Mild calcific atherosclerosis of the carotid bifurcation without hemodynamically significant stenosis by NASCET criteria. Patent  common developmentally smaller. VERTEBRAL ARTERIES:RIGHT vertebral artery is dominant. Normal appearance of the vertebral arteries, widely patent. SKELETON: No acute osseous process though bone windows have not been submitted. Moderate to severe cervical spondylosis. Severe C3-4 through C6-7 neural foraminal narrowing. OTHER NECK: Soft tissues of the neck are nonacute though, not tailored for evaluation. LEFT > RIGHT thyromegaly without dominant nodule. UPPER CHEST: Included lung apices are clear. No superior mediastinal lymphadenopathy. CTA HEAD FINDINGS: ANTERIOR CIRCULATION: Patent cervical internal carotid arteries, petrous, cavernous and supra clinoid internal carotid arteries. Patent anterior communicating artery. Patent anterior and middle cerebral arteries. Anterior cerebral artery splayed by interhemispheric cyst. Severe stenosis LEFT A3 segment. No large vessel occlusion, significant stenosis, contrast extravasation or aneurysm. POSTERIOR CIRCULATION: Patent vertebral arteries, vertebrobasilar junction and basilar artery, as well as main branch vessels. Patent posterior cerebral arteries. Moderate tandem stenosis RIGHT PCA, severe stenosis RIGHT P3 segment. Moderate tandem stenosis LEFT PCA. No large vessel occlusion, significant stenosis, contrast extravasation or aneurysm. VENOUS SINUSES: Major dural venous sinuses are patent though not tailored for evaluation on this angiographic examination. ANATOMIC VARIANTS: None. DELAYED PHASE: No abnormal intracranial enhancement. MIP images reviewed. IMPRESSION: CT HEAD: 1. No acute intracranial process. 2. Trace residual intraventricular blood products. Stable ventriculomegaly. 3. Stable congenital anomalies. CTA NECK: 1. No hemodynamically significant stenosis ICA's. Patent vertebral arteries. 2. Multilevel severe neural foraminal narrowing. CTA HEAD: 1. No emergent large vessel occlusion. 2. Severe stenosis LEFT A3 and RIGHT P3 segments. Electronically Signed    By: Awilda Metro M.D.   On: 11/05/2018 03:58  Scheduled Meds: . amLODipine  2.5 mg Oral Daily  . carvedilol  3.125 mg Oral BID WC  . escitalopram  10 mg Oral Daily  . galantamine  16 mg Oral Q breakfast  . insulin aspart  0-15 Units Subcutaneous TID WC  . insulin aspart  0-5 Units Subcutaneous QHS  . insulin glargine  15 Units Subcutaneous QHS  . iopamidol      . levETIRAcetam  500 mg Oral BID  . lisinopril  20 mg Oral Daily  . LORazepam  0.25 mg Oral QHS  . Melatonin  3 mg Oral QHS  . mirtazapine  30 mg Oral QHS  . pantoprazole  40 mg Oral Daily  . phenytoin  300 mg Oral Daily  . risperiDONE  2 mg Oral QHS   Continuous Infusions: . cefTRIAXone (ROCEPHIN)  IV 1 g (11/05/18 0909)     LOS: 3 days    Time spent: 25 minutes   Noralee Stain, DO [facility] [email or web address] Password [email or web address] 11/05/2018, 11:15 AM

## 2018-11-05 NOTE — Consult Note (Signed)
            [facility] CM Primary Care Navigator  11/05/2018  Mendel RyderSamuel W Dains 11-Jun-1946 [id number]   Went to see patient at the bedside to identify possible discharge needs butRN is in the room providing care for patient at this time.  Per MD note, patient who lives at [facility], had recent head injury from a fall with CT head that showed no intracranial bleeding. He was sent from nursing home due to uncontrolled blood sugar in the 500s, worsening mental status.  (acute metabolic encephalopathy, diabetes mellitus type 2 with uncontrolled hyperglycemia, left frontal lobe and left insular punctate infarcts, recurrent falls, hypernatremia possibly secondary to dehydration/ poor oral intake)    Anticipated discharge plan is SNF- skilled nursing facility per therapy recommendation.   Will attempt to see patient at another time when available in the room.    Addendum (10/2618):  Went back to see patient at the bedside(unable to answer questions adequately) and spoke with his brother [name]([name]) over the phoneto identify possible discharge needs. Brother statesthat patient was "listless, not acting like himself and confused" which had led to this admission.  Patient's brotherendorses Dr.Mark Perini with [facility] as theprimary care provider.   Brother mentioned that patient has been a resident [facility] unit.   According to brother, patientusesfacilitypharmacy to obtain medications and staff dispense and administerhismedications at the facility.  Patient's brother hasbeen providing transportation for him, otherwise, sister provides transportation to his doctors' appointments when needed.  Per brother, facilitystaff provide assistancewith patient's care needs whenever needed.  Anticipateddischargeplan isskilled nursing facilityfor rehabilitationas recommended bytherapy. (SNF- [facility])   Patient's brother voiced  understanding to call primary care provider's office if everpatientis discharged out of skilled nursing facility, for a post discharge follow-up within1- 2weeksor sooner if needs arise. Patient letter (with PCP's contact number) was provided at the bedsideasareminder and brother was made aware.  Explained topatient's brother regardingTHN CM services available for health management andresourcesat homebut he was unsure yet of plan after rehab. He states that depending on patient's recovery and result of rehab, where patient will be going after that. Patient's brother wasencouragedto discuss with primary care provider on patient's next visit regarding needs and available assistance in managing patient's health conditions as appropriate.   Brother expressed understanding to seekreferral to [facility] care management from primary care providerifdeemed necessary and appropriate forany services in thenearfuture- once discharge out of skilled nursing facility.  [facility] care management information was provided for future needs thatpatientmay have.  Primary care provider's office is listed as providing transition of care (TOC).   For additional questions please contact:  Karin GoldenLorraine A. Zainab Crumrine, BSN, RN-BC [facility] PRIMARY CARE Navigator Cell: [phone]

## 2018-11-05 NOTE — Progress Notes (Signed)
STROKE TEAM PROGRESS NOTE    SUBJECTIVE (INTERVAL HISTORY) Patient up in chair. Says hello, oriented to name, says "thank you". No family at bedside.   OBJECTIVE Vitals:   11/04/18 1625 11/04/18 2103 11/05/18 0532 11/05/18 0911  BP: 139/79 110/72 (!) 147/89 132/72  Pulse: 79 68 66 72  Resp: 18 18 18  (!) 22  Temp: 98.5 F (36.9 C) 98.8 F (37.1 C) 98.9 F (37.2 C) 98.1 F (36.7 C)  TempSrc: Oral Oral  Oral  SpO2: 98% 98% 100% 100%  Weight:      Height:        CBC:  Recent Labs  Lab 11/01/18 1036 11/02/18 0507 11/03/18 0427  WBC 11.2* 12.4* 10.0  NEUTROABS 7.7  --   --   HGB 12.7* 11.4* 10.6*  HCT 40.5 34.2* 32.5*  MCV 100.0 97.7 95.0  PLT 150 137* 130*    Basic Metabolic Panel:  Recent Labs  Lab 11/04/18 0450 11/05/18 0518  NA 143 143  K 3.1* 3.3*  CL 106 106  CO2 29 28  GLUCOSE 236* 123*  BUN 14 15  CREATININE 0.93 0.98  CALCIUM 9.0 8.7*  MG 1.6* 1.7    Lipid Panel:     Component Value Date/Time   CHOL 101 06/16/2015 0212   TRIG 85 06/16/2015 0212   HDL 33 (L) 06/16/2015 0212   CHOLHDL 3.1 06/16/2015 0212   VLDL 17 06/16/2015 0212   LDLCALC 51 06/16/2015 0212   HgbA1c:  Lab Results  Component Value Date   HGBA1C 9.2 (H) 11/01/2018   Urine Drug Screen:     Component Value Date/Time   LABOPIA NONE DETECTED 05/13/2017 2045   COCAINSCRNUR NONE DETECTED 05/13/2017 2045   LABBENZ NONE DETECTED 05/13/2017 2045   AMPHETMU NONE DETECTED 05/13/2017 2045   THCU NONE DETECTED 05/13/2017 2045   LABBARB NONE DETECTED 05/13/2017 2045    Alcohol Level     Component Value Date/Time   ETH <5 05/13/2017 2100    IMAGING  Ct Head Wo Contrast 11/01/2018 IMPRESSION:  1. Trace intraventricular hemorrhage.  2. Possible 3 mm petechial hemorrhage in the left frontal lobe.  3. Decreased size of frontal scalp hematoma.   [name] 11/01/2018 IMPRESSION:  1. Incomplete, motion degraded examination.  2. 2 cm diffusion abnormality involving  left frontoparietal cortex at the vertex which may reflect an acute/early subacute infarct or possibly contusion.  3. Possible additional punctate acute to early subacute infarcts in the left frontal lobe and left insula.  4. Trace intraventricular hemorrhage.  5. Chronic changes as above.   Ct Angio Head W Or Wo Contrast  Result Date: 11/05/2018 CLINICAL DATA:  Vision changes, follow up stroke. History of stroke, hypertension, seizures, hypertension, hyperlipidemia. EXAM: CT ANGIOGRAPHY HEAD AND NECK TECHNIQUE: Multidetector CT imaging of the head and neck was performed using the standard protocol during bolus administration of intravenous contrast. Multiplanar CT image reconstructions and MIPs were obtained to evaluate the vascular anatomy. Carotid stenosis measurements (when applicable) are obtained utilizing NASCET criteria, using the distal internal carotid diameter as the denominator. CONTRAST:  75mL ISOVUE-370 IOPAMIDOL (ISOVUE-370) INJECTION 76% COMPARISON:  MRI head November 01, 2018 and CT HEAD November 01, 2018 and CT angiogram head and neck osseous fourth 2016 FINDINGS: CT HEAD FINDINGS BRAIN: No intraparenchymal hemorrhage, mass effect nor midline shift. Severe ventriculomegaly with agenesis of corpus callosum and colpocephaly. Minimal dependent blood products within occipital horns. LEFT para median cyst in additional to suspected RIGHT mesial frontal encephalomalacia  versus schizencephaly. Periventricular white matter hypodensities seen with gliosis, chronic small vessel ischemic changes and/or interstitial edema. Moderate cerebellar atrophy. No midline shift. No acute large vascular territory infarcts. VASCULAR: Moderate calcific atherosclerosis of the carotid siphons. SKULL: No skull fracture. No significant scalp soft tissue swelling. SINUSES/ORBITS: Mild paranasal sinus mucosal thickening. Mastoid air cells are well aerated.The included ocular globes and orbital contents are  non-suspicious. OTHER: None. CTA NECK FINDINGS: AORTIC ARCH: Normal appearance of the thoracic arch, normal branch pattern. Mild calcific atherosclerosis aortic arch. The origins of the innominate, left Common carotid artery and subclavian artery are widely patent. RIGHT CAROTID SYSTEM: Common carotid artery is patent. Mild calcific atherosclerosis of the carotid bifurcation without hemodynamically significant stenosis by NASCET criteria. Normal appearance of the internal carotid artery. LEFT CAROTID SYSTEM: Common carotid artery is patent. Mild calcific atherosclerosis of the carotid bifurcation without hemodynamically significant stenosis by NASCET criteria. Patent common developmentally smaller. VERTEBRAL ARTERIES:RIGHT vertebral artery is dominant. Normal appearance of the vertebral arteries, widely patent. SKELETON: No acute osseous process though bone windows have not been submitted. Moderate to severe cervical spondylosis. Severe C3-4 through C6-7 neural foraminal narrowing. OTHER NECK: Soft tissues of the neck are nonacute though, not tailored for evaluation. LEFT > RIGHT thyromegaly without dominant nodule. UPPER CHEST: Included lung apices are clear. No superior mediastinal lymphadenopathy. CTA HEAD FINDINGS: ANTERIOR CIRCULATION: Patent cervical internal carotid arteries, petrous, cavernous and supra clinoid internal carotid arteries. Patent anterior communicating artery. Patent anterior and middle cerebral arteries. Anterior cerebral artery splayed by interhemispheric cyst. Severe stenosis LEFT A3 segment. No large vessel occlusion, significant stenosis, contrast extravasation or aneurysm. POSTERIOR CIRCULATION: Patent vertebral arteries, vertebrobasilar junction and basilar artery, as well as main branch vessels. Patent posterior cerebral arteries. Moderate tandem stenosis RIGHT PCA, severe stenosis RIGHT P3 segment. Moderate tandem stenosis LEFT PCA. No large vessel occlusion, significant stenosis,  contrast extravasation or aneurysm. VENOUS SINUSES: Major dural venous sinuses are patent though not tailored for evaluation on this angiographic examination. ANATOMIC VARIANTS: None. DELAYED PHASE: No abnormal intracranial enhancement. MIP images reviewed. IMPRESSION: CT HEAD: 1. No acute intracranial process. 2. Trace residual intraventricular blood products. Stable ventriculomegaly. 3. Stable congenital anomalies. CTA NECK: 1. No hemodynamically significant stenosis ICA's. Patent vertebral arteries. 2. Multilevel severe neural foraminal narrowing. CTA HEAD: 1. No emergent large vessel occlusion. 2. Severe stenosis LEFT A3 and RIGHT P3 segments. Electronically Signed   By: Awilda Metro M.D.   On: 11/05/2018 03:58   Ct Angio Neck W Or Wo Contrast  Result Date: 11/05/2018 CLINICAL DATA:  Vision changes, follow up stroke. History of stroke, hypertension, seizures, hypertension, hyperlipidemia. EXAM: CT ANGIOGRAPHY HEAD AND NECK TECHNIQUE: Multidetector CT imaging of the head and neck was performed using the standard protocol during bolus administration of intravenous contrast. Multiplanar CT image reconstructions and MIPs were obtained to evaluate the vascular anatomy. Carotid stenosis measurements (when applicable) are obtained utilizing NASCET criteria, using the distal internal carotid diameter as the denominator. CONTRAST:  75mL ISOVUE-370 IOPAMIDOL (ISOVUE-370) INJECTION 76% COMPARISON:  MRI head November 01, 2018 and CT HEAD November 01, 2018 and CT angiogram head and neck osseous fourth 2016 FINDINGS: CT HEAD FINDINGS BRAIN: No intraparenchymal hemorrhage, mass effect nor midline shift. Severe ventriculomegaly with agenesis of corpus callosum and colpocephaly. Minimal dependent blood products within occipital horns. LEFT para median cyst in additional to suspected RIGHT mesial frontal encephalomalacia versus schizencephaly. Periventricular white matter hypodensities seen with gliosis, chronic small  vessel ischemic changes and/or interstitial edema. Moderate cerebellar  atrophy. No midline shift. No acute large vascular territory infarcts. VASCULAR: Moderate calcific atherosclerosis of the carotid siphons. SKULL: No skull fracture. No significant scalp soft tissue swelling. SINUSES/ORBITS: Mild paranasal sinus mucosal thickening. Mastoid air cells are well aerated.The included ocular globes and orbital contents are non-suspicious. OTHER: None. CTA NECK FINDINGS: AORTIC ARCH: Normal appearance of the thoracic arch, normal branch pattern. Mild calcific atherosclerosis aortic arch. The origins of the innominate, left Common carotid artery and subclavian artery are widely patent. RIGHT CAROTID SYSTEM: Common carotid artery is patent. Mild calcific atherosclerosis of the carotid bifurcation without hemodynamically significant stenosis by NASCET criteria. Normal appearance of the internal carotid artery. LEFT CAROTID SYSTEM: Common carotid artery is patent. Mild calcific atherosclerosis of the carotid bifurcation without hemodynamically significant stenosis by NASCET criteria. Patent common developmentally smaller. VERTEBRAL ARTERIES:RIGHT vertebral artery is dominant. Normal appearance of the vertebral arteries, widely patent. SKELETON: No acute osseous process though bone windows have not been submitted. Moderate to severe cervical spondylosis. Severe C3-4 through C6-7 neural foraminal narrowing. OTHER NECK: Soft tissues of the neck are nonacute though, not tailored for evaluation. LEFT > RIGHT thyromegaly without dominant nodule. UPPER CHEST: Included lung apices are clear. No superior mediastinal lymphadenopathy. CTA HEAD FINDINGS: ANTERIOR CIRCULATION: Patent cervical internal carotid arteries, petrous, cavernous and supra clinoid internal carotid arteries. Patent anterior communicating artery. Patent anterior and middle cerebral arteries. Anterior cerebral artery splayed by interhemispheric cyst. Severe stenosis  LEFT A3 segment. No large vessel occlusion, significant stenosis, contrast extravasation or aneurysm. POSTERIOR CIRCULATION: Patent vertebral arteries, vertebrobasilar junction and basilar artery, as well as main branch vessels. Patent posterior cerebral arteries. Moderate tandem stenosis RIGHT PCA, severe stenosis RIGHT P3 segment. Moderate tandem stenosis LEFT PCA. No large vessel occlusion, significant stenosis, contrast extravasation or aneurysm. VENOUS SINUSES: Major dural venous sinuses are patent though not tailored for evaluation on this angiographic examination. ANATOMIC VARIANTS: None. DELAYED PHASE: No abnormal intracranial enhancement. MIP images reviewed. IMPRESSION: CT HEAD: 1. No acute intracranial process. 2. Trace residual intraventricular blood products. Stable ventriculomegaly. 3. Stable congenital anomalies. CTA NECK: 1. No hemodynamically significant stenosis ICA's. Patent vertebral arteries. 2. Multilevel severe neural foraminal narrowing. CTA HEAD: 1. No emergent large vessel occlusion. 2. Severe stenosis LEFT A3 and RIGHT P3 segments. Electronically Signed   By: Awilda Metro M.D.   On: 11/05/2018 03:58      PHYSICAL EXAM Exam: Awake, alert, orientedto name, raises arms on command, MR Speech:   No aphasia, no dysarthria, says "thank you" Cognition: Impaired cognition, he has a hx of mental retardation   Cranial Nerves:    The pupils are equal, round, and reactive to light.Trigeminal sensation is intact and the muscles of mastication are normal. The face is symmetric. Hearing appears intact to voice. nonverbal.   Coordination:  No apparent dysmetria  Motor Observation:    No asymmetry, no atrophy, and no involuntary movements noted. Tone:    Normal muscle tone.     Strength:  cannot assess strength or sensation, patient uncooperative       ASSESSMENT/PLAN [name] is a 72 y.o. male with history of CKD, CAD, DM, CVA (06/2015) , epilepsy ( keppra,  dilantin)  presenting with AMS. He did not receive IV t-PA due to late presentation.  Acute metabolic encephalopathy  Not related to stroke dx  Recurrent Fall w/ contusion  Contusion seen on MRI w/ IVH  Hx Epilepsy, Unwitnessed Seizure  On keppra and dilatin  Increased dilantin to 300 daily due  to low level   Follow dilatin levels daily and adjust as needed  Strokes:  Incidental L frontal and L insular cortical infarcts with trace IVH - embolic - source unknown  CT head - Trace intraventricular hemorrhage. Possible 3 mm petechial hemorrhage in the left frontal lobe.   MRI head -  2 cm diffusion abnormality involving left frontoparietal cortex at the vertex which may reflect an acute/early subacute infarct or possibly contusion. Possible additional punctate acute to early subacute infarcts in the left frontal lobe and left insula. Trace intraventricular hemorrhage.   CTA H&N - pending   2D Echo  - .pending   LDL - 51  HgbA1c - 9.2  UDS - not performed  VTE prophylaxis - SCDs  aspirin 325 mg daily prior to admission, now on No antithrombotic. Off aspirin d/t IVH. Resume at discharge. ASA 81mg .  Therapy recommendations:  SNF  Disposition:  Pending  Will complete stroke workup in the event we can prevent future strokes with medical management  Would not pursue further embolic workup given that pt is not an AC candidate  Hypertension  Stable - at goal  SBP goal < 160 . BP goal normotensive  Hyperlipidemia  Lipid lowering medication PTA:  none  LDL 51, goal < 70  Current lipid lowering medication: none  Continue statin at discharge  Diabetes  HgbA1c 9.2, goal < 7.0  Uncontrolled  Other Stroke Risk Factors  Advanced age  Former cigarette smoker - quit  Hx stroke/TIA  Coronary artery disease  Obstructive sleep apnea  Other Problems  Hypokalemia  Anemia  Mentally deficient   Stroke team will sign off  This is a 72 year old patient  with a history of mental retardation, seizures, diabetes, chronic kidney disease presented with altered mental status and a fall several days prior.  His acute metabolic encephalopathy not related to stroke but incidental left frontal and left insular cortical infarcts with trace intraventricular hemorrhage were seen likely embolic source unknown.  At this time will complete the stroke work-up but he is not a good candidate for anticoagulation so we will not proceed with TEE or loop.  Personally examined patient and images, and have participated in and made any corrections needed to history, physical, neuro exam,assessment and plan as stated above.  I have personally obtained the history, evaluated lab date, reviewed imaging studies and agree with radiology interpretations.    Arthur DeanAntonia Evangelynn Lochridge, MD Stroke Neurology   A total of 15 minutes was spent for the care of this patient, spent on deciding diagnostic and therapeutic options, counseling and coordination of care, riskd ans benefits of management, compliance, or risk factor reduction and education.    To contact Stroke Continuity provider, please refer to [email or web address]. After hours, contact General Neurology

## 2018-11-06 DIAGNOSIS — G9341 Metabolic encephalopathy: Secondary | ICD-10-CM

## 2018-11-06 LAB — BASIC METABOLIC PANEL
Anion gap: 10 (ref 5–15)
BUN: 11 mg/dL (ref 8–23)
CO2: 28 mmol/L (ref 22–32)
Calcium: 8.9 mg/dL (ref 8.9–10.3)
Chloride: 105 mmol/L (ref 98–111)
Creatinine, Ser: 0.97 mg/dL (ref 0.61–1.24)
GFR calc Af Amer: >60 mL/min (ref >60)
GFR calc non Af Amer: >60 mL/min (ref >60)
Glucose, Bld: 143 mg/dL — ABNORMAL HIGH (ref 70–99)
Potassium: 3.3 mmol/L — ABNORMAL LOW (ref 3.5–5.1)
Sodium: 143 mmol/L (ref 135–145)

## 2018-11-06 LAB — URINE CULTURE: Culture: >=100000 — AB

## 2018-11-06 LAB — GLUCOSE, CAPILLARY
Glucose-Capillary: 139 mg/dL — ABNORMAL HIGH (ref 70–99)
Glucose-Capillary: 252 mg/dL — ABNORMAL HIGH (ref 70–99)
Glucose-Capillary: 218 mg/dL — ABNORMAL HIGH (ref 70–99)
Glucose-Capillary: 149 mg/dL — ABNORMAL HIGH (ref 70–99)

## 2018-11-06 LAB — MAGNESIUM: Magnesium: 1.6 mg/dL — ABNORMAL LOW (ref 1.7–2.4)

## 2018-11-06 MED ORDER — CEPHALEXIN 500 MG PO CAPS
500.0000 mg | ORAL_CAPSULE | Freq: Two times a day (BID) | ORAL | Status: DC
  Administered 2018-11-06 – 2018-11-07 (×3): 500 mg via ORAL
  Filled 2018-11-06 (×3): qty 1

## 2018-11-06 MED ORDER — POTASSIUM CHLORIDE CRYS ER 20 MEQ PO TBCR
40.0000 meq | EXTENDED_RELEASE_TABLET | ORAL | Status: AC
  Administered 2018-11-06 (×2): 40 meq via ORAL
  Filled 2018-11-06 (×2): qty 2

## 2018-11-06 MED ORDER — MAGNESIUM SULFATE 4 GM/100ML IV SOLN
4.0000 g | Freq: Once | INTRAVENOUS | Status: AC
  Administered 2018-11-06: 4 g via INTRAVENOUS
  Filled 2018-11-06: qty 100

## 2018-11-06 NOTE — Progress Notes (Signed)
PROGRESS NOTE    Ivar BurySamuel W Lohmann  ZOX:[id number] DOB: 05/03/1946 DOA: 11/01/2018 PCP: Rodrigo RanPerini, Mark, MD     Brief Narrative:  Mendel RyderSamuel W Hundal is a 72 y.o. male with past medical history significant for slight confusion and underlying dementia, CKD, CAD, diabetes mellitus, history of epilepsy, stroke who [facility].  He had a recent head injury 12/10 from fall and had CT head that showed no intracranial bleeding and had suture placed.  He was again sent from nursing home due to uncontrolled blood sugar in 500, worsening of his mental status, "patient was not being himself".  Apparently patient has not been eating well.  There was no reported recent falls since the last one.  Imaging in the emergency department revealed trace intraventricular hemorrhage, acute/subacute infarct.  New events last 24 hours / Subjective: No new complaints, wants to eat breakfast   Assessment & Plan:   Principal Problem:   Acute metabolic encephalopathy Active Problems:   HLD (hyperlipidemia)   Dehydration   Stroke (HCC)   Fall   Type 2 diabetes mellitus with other circulatory complications (HCC)   Hypernatremia   Acute metabolic encephalopathy Appears to be back to his baseline  Recurrent falls PT OT, rec SNF   Left frontal lobe and left insular punctate infarcts  MRI brain showed 2cm diffuse abnormality left frontoparietal cortex possible acute/early subacute infarct or possibly contusion, possible punctate acute/subacute infarct left frontal lobe and left insula  CTA head/neck showed no hemodynamically significant stenosis or large vessel occlusion. Showed severe stenosis left A1 and right P3 segments  Neurology consulted, patient not good candidate for anticoagulation so did not pursue TEE or loop recorder  Hold aspirin due to intraventricular hemorrhage  PT OT SLP   Intraventricular hemorrhage On-call neurosurgeon does not feel it is clinically significant, had no other neurosurgical  interventions offered at this time Hold aspirin  Hypernatremia Suspect secondary to dehydration, poor oral intake Resolved Encourage PO intake   Diabetes mellitus type 2 with uncontrolled hyperglycemia Hemoglobin A1c 9.2  Stop glipizide, concerned about hypoglycemic episodes with poor PO intake at ALF and frequent falls  Lantus, SSI   HTN Continue norvasc, coreg, lisinopril   History of epilepsy Continue Dilantin, Keppra  Mood disorder Continue Lexapro, galantamine, Ativan, Remeron, Risperdal  Hypokalemia, hypomagnesemia  Replace, trend   Proteus mirabilis UTI Deescalate today to keflex    DVT prophylaxis: SCD Code Status: Full Family Communication: No family at bedside Disposition Plan: SNF when bed available for discharge, likely 12/26 due to SNF pharmacy not being open on 12/25    Consultants:   Neurology  Procedures:   None   Antimicrobials:  Anti-infectives (From admission, onward)   Start     Dose/Rate Route Frequency Ordered Stop   11/06/18 1115  cephALEXin (KEFLEX) capsule 500 mg     500 mg Oral Every 12 hours 11/06/18 1103     11/05/18 0900  cefTRIAXone (ROCEPHIN) 1 g in sodium chloride 0.9 % 100 mL IVPB  Status:  Discontinued     1 g 200 mL/hr over 30 Minutes Intravenous Daily 11/05/18 0739 11/06/18 1103       Objective: Vitals:   11/05/18 1655 11/05/18 2104 11/06/18 0501 11/06/18 0716  BP: (!) 144/76 (!) 144/91 (!) 172/94 (!) 155/80  Pulse: 67 72 66 61  Resp: 20 15  18   Temp: 98.2 F (36.8 C) 98 F (36.7 C) 98 F (36.7 C) 98.2 F (36.8 C)  TempSrc: Oral Oral  Oral  SpO2: 97% 98% 99% 98%  Weight:  74 kg    Height:        Intake/Output Summary (Last 24 hours) at 11/06/2018 1104 Last data filed at 11/06/2018 0932 Gross per 24 hour  Intake 450 ml  Output 1850 ml  Net -1400 ml   Filed Weights   11/01/18 2200 11/02/18 2133 11/05/18 2104  Weight: 90.7 kg 73.9 kg 74 kg     Examination: General exam: Appears calm and comfortable    Respiratory system: Clear to auscultation. Respiratory effort normal. Cardiovascular system: S1 & S2 heard, RRR. No JVD, murmurs, rubs, gallops or clicks. No pedal edema. Gastrointestinal system: Abdomen is nondistended, soft and nontender. No organomegaly or masses felt. Normal bowel sounds heard. Central nervous system: Alert and oriented. No new focal neurological deficits. Extremities: Symmetric 5 x 5 power. Skin: No rashes, lesions or ulcers Psychiatry: Judgement and insight appear stable    Data Reviewed: I have personally reviewed following labs and imaging studies  CBC: Recent Labs  Lab 11/01/18 1036 11/02/18 0507 11/03/18 0427  WBC 11.2* 12.4* 10.0  NEUTROABS 7.7  --   --   HGB 12.7* 11.4* 10.6*  HCT 40.5 34.2* 32.5*  MCV 100.0 97.7 95.0  PLT 150 137* 130*   Basic Metabolic Panel: Recent Labs  Lab 11/02/18 0507 11/03/18 0427 11/04/18 0450 11/05/18 0518 11/06/18 0610  NA 147* 138 143 143 143  K 3.4* 3.1* 3.1* 3.3* 3.3*  CL 109 104 106 106 105  CO2 25 24 29 28 28   GLUCOSE 190* 301* 236* 123* 143*  BUN 23 21 14 15 11   CREATININE 1.08 1.01 0.93 0.98 0.97  CALCIUM 8.5* 8.7* 9.0 8.7* 8.9  MG  --  1.4* 1.6* 1.7 1.6*   GFR: Estimated Creatinine Clearance: 66.6 mL/min (by C-G formula based on SCr of 0.97 mg/dL). Liver Function Tests: Recent Labs  Lab 11/01/18 1036  AST 12*  ALT 16  ALKPHOS 162*  BILITOT 0.6  PROT 7.0  ALBUMIN 3.7   No results for input(s): LIPASE, AMYLASE in the last 168 hours. No results for input(s): AMMONIA in the last 168 hours. Coagulation Profile: No results for input(s): INR, PROTIME in the last 168 hours. Cardiac Enzymes: No results for input(s): CKTOTAL, CKMB, CKMBINDEX, TROPONINI in the last 168 hours. BNP (last 3 results) No results for input(s): PROBNP in the last 8760 hours. HbA1C: No results for input(s): HGBA1C in the last 72 hours. CBG: Recent Labs  Lab 11/05/18 0746 11/05/18 1141 11/05/18 1654 11/05/18 2105  11/06/18 0715  GLUCAP 115* 181* 202* 194* 139*   Lipid Profile: No results for input(s): CHOL, HDL, LDLCALC, TRIG, CHOLHDL, LDLDIRECT in the last 72 hours. Thyroid Function Tests: No results for input(s): TSH, T4TOTAL, FREET4, T3FREE, THYROIDAB in the last 72 hours. Anemia Panel: No results for input(s): VITAMINB12, FOLATE, FERRITIN, TIBC, IRON, RETICCTPCT in the last 72 hours. Sepsis Labs: No results for input(s): PROCALCITON, LATICACIDVEN in the last 168 hours.  Recent Results (from the past 240 hour(s))  MRSA PCR Screening     Status: None   Collection Time: 11/01/18  5:15 PM  Result Value Ref Range Status   MRSA by PCR NEGATIVE NEGATIVE Final    Comment:        The GeneXpert MRSA Assay (FDA approved for NASAL specimens only), is one component of a comprehensive MRSA colonization surveillance program. It is not intended to diagnose MRSA infection nor to guide or monitor treatment for MRSA infections. Performed at [facility], [address], [place], [place] [address]   Culture, Urine     Status: Abnormal   Collection Time: 11/04/18  6:40 PM  Result Value Ref Range Status   Specimen Description URINE, CATHETERIZED  Final   Special Requests   Final    NONE Performed at [facility], [address], [place], [place] [address]    Culture >=100,000 COLONIES/mL PROTEUS MIRABILIS (A)  Final   Report Status 11/06/2018 FINAL  Final   Organism ID, Bacteria PROTEUS MIRABILIS (A)  Final      Susceptibility   Proteus mirabilis - MIC*    AMPICILLIN <=2 SENSITIVE Sensitive     CEFAZOLIN <=4 SENSITIVE Sensitive     CEFTRIAXONE <=1 SENSITIVE Sensitive     CIPROFLOXACIN 1 SENSITIVE Sensitive     GENTAMICIN <=1 SENSITIVE Sensitive     IMIPENEM 1 SENSITIVE Sensitive     NITROFURANTOIN 128 RESISTANT Resistant     TRIMETH/SULFA <=20 SENSITIVE Sensitive     AMPICILLIN/SULBACTAM <=2 SENSITIVE Sensitive     PIP/TAZO <=4 SENSITIVE Sensitive     * >=100,000 COLONIES/mL  PROTEUS MIRABILIS       Radiology Studies: Ct Angio Head W Or Wo Contrast  Result Date: 11/05/2018 CLINICAL DATA:  Vision changes, follow up stroke. History of stroke, hypertension, seizures, hypertension, hyperlipidemia. EXAM: CT ANGIOGRAPHY HEAD AND NECK TECHNIQUE: Multidetector CT imaging of the head and neck was performed using the standard protocol during bolus administration of intravenous contrast. Multiplanar CT image reconstructions and MIPs were obtained to evaluate the vascular anatomy. Carotid stenosis measurements (when applicable) are obtained utilizing NASCET criteria, using the distal internal carotid diameter as the denominator. CONTRAST:  75mL ISOVUE-370 IOPAMIDOL (ISOVUE-370) INJECTION 76% COMPARISON:  MRI head November 01, 2018 and CT HEAD November 01, 2018 and CT angiogram head and neck osseous fourth 2016 FINDINGS: CT HEAD FINDINGS BRAIN: No intraparenchymal hemorrhage, mass effect nor midline shift. Severe ventriculomegaly with agenesis of corpus callosum and colpocephaly. Minimal dependent blood products within occipital horns. LEFT para median cyst in additional to suspected RIGHT mesial frontal encephalomalacia versus schizencephaly. Periventricular white matter hypodensities seen with gliosis, chronic small vessel ischemic changes and/or interstitial edema. Moderate cerebellar atrophy. No midline shift. No acute large vascular territory infarcts. VASCULAR: Moderate calcific atherosclerosis of the carotid siphons. SKULL: No skull fracture. No significant scalp soft tissue swelling. SINUSES/ORBITS: Mild paranasal sinus mucosal thickening. Mastoid air cells are well aerated.The included ocular globes and orbital contents are non-suspicious. OTHER: None. CTA NECK FINDINGS: AORTIC ARCH: Normal appearance of the thoracic arch, normal branch pattern. Mild calcific atherosclerosis aortic arch. The origins of the innominate, left Common carotid artery and subclavian artery are widely  patent. RIGHT CAROTID SYSTEM: Common carotid artery is patent. Mild calcific atherosclerosis of the carotid bifurcation without hemodynamically significant stenosis by NASCET criteria. Normal appearance of the internal carotid artery. LEFT CAROTID SYSTEM: Common carotid artery is patent. Mild calcific atherosclerosis of the carotid bifurcation without hemodynamically significant stenosis by NASCET criteria. Patent common developmentally smaller. VERTEBRAL ARTERIES:RIGHT vertebral artery is dominant. Normal appearance of the vertebral arteries, widely patent. SKELETON: No acute osseous process though bone windows have not been submitted. Moderate to severe cervical spondylosis. Severe C3-4 through C6-7 neural foraminal narrowing. OTHER NECK: Soft tissues of the neck are nonacute though, not tailored for evaluation. LEFT > RIGHT thyromegaly without dominant nodule. UPPER CHEST: Included lung apices are clear. No superior mediastinal lymphadenopathy. CTA HEAD FINDINGS: ANTERIOR CIRCULATION: Patent cervical internal carotid arteries, petrous,  cavernous and supra clinoid internal carotid arteries. Patent anterior communicating artery. Patent anterior and middle cerebral arteries. Anterior cerebral artery splayed by interhemispheric cyst. Severe stenosis LEFT A3 segment. No large vessel occlusion, significant stenosis, contrast extravasation or aneurysm. POSTERIOR CIRCULATION: Patent vertebral arteries, vertebrobasilar junction and basilar artery, as well as main branch vessels. Patent posterior cerebral arteries. Moderate tandem stenosis RIGHT PCA, severe stenosis RIGHT P3 segment. Moderate tandem stenosis LEFT PCA. No large vessel occlusion, significant stenosis, contrast extravasation or aneurysm. VENOUS SINUSES: Major dural venous sinuses are patent though not tailored for evaluation on this angiographic examination. ANATOMIC VARIANTS: None. DELAYED PHASE: No abnormal intracranial enhancement. MIP images reviewed.  IMPRESSION: CT HEAD: 1. No acute intracranial process. 2. Trace residual intraventricular blood products. Stable ventriculomegaly. 3. Stable congenital anomalies. CTA NECK: 1. No hemodynamically significant stenosis ICA's. Patent vertebral arteries. 2. Multilevel severe neural foraminal narrowing. CTA HEAD: 1. No emergent large vessel occlusion. 2. Severe stenosis LEFT A3 and RIGHT P3 segments. Electronically Signed   By: Awilda Metroourtnay  Bloomer M.D.   On: 11/05/2018 03:58   Ct Angio Neck W Or Wo Contrast  Result Date: 11/05/2018 CLINICAL DATA:  Vision changes, follow up stroke. History of stroke, hypertension, seizures, hypertension, hyperlipidemia. EXAM: CT ANGIOGRAPHY HEAD AND NECK TECHNIQUE: Multidetector CT imaging of the head and neck was performed using the standard protocol during bolus administration of intravenous contrast. Multiplanar CT image reconstructions and MIPs were obtained to evaluate the vascular anatomy. Carotid stenosis measurements (when applicable) are obtained utilizing NASCET criteria, using the distal internal carotid diameter as the denominator. CONTRAST:  75mL ISOVUE-370 IOPAMIDOL (ISOVUE-370) INJECTION 76% COMPARISON:  MRI head November 01, 2018 and CT HEAD November 01, 2018 and CT angiogram head and neck osseous fourth 2016 FINDINGS: CT HEAD FINDINGS BRAIN: No intraparenchymal hemorrhage, mass effect nor midline shift. Severe ventriculomegaly with agenesis of corpus callosum and colpocephaly. Minimal dependent blood products within occipital horns. LEFT para median cyst in additional to suspected RIGHT mesial frontal encephalomalacia versus schizencephaly. Periventricular white matter hypodensities seen with gliosis, chronic small vessel ischemic changes and/or interstitial edema. Moderate cerebellar atrophy. No midline shift. No acute large vascular territory infarcts. VASCULAR: Moderate calcific atherosclerosis of the carotid siphons. SKULL: No skull fracture. No significant scalp  soft tissue swelling. SINUSES/ORBITS: Mild paranasal sinus mucosal thickening. Mastoid air cells are well aerated.The included ocular globes and orbital contents are non-suspicious. OTHER: None. CTA NECK FINDINGS: AORTIC ARCH: Normal appearance of the thoracic arch, normal branch pattern. Mild calcific atherosclerosis aortic arch. The origins of the innominate, left Common carotid artery and subclavian artery are widely patent. RIGHT CAROTID SYSTEM: Common carotid artery is patent. Mild calcific atherosclerosis of the carotid bifurcation without hemodynamically significant stenosis by NASCET criteria. Normal appearance of the internal carotid artery. LEFT CAROTID SYSTEM: Common carotid artery is patent. Mild calcific atherosclerosis of the carotid bifurcation without hemodynamically significant stenosis by NASCET criteria. Patent common developmentally smaller. VERTEBRAL ARTERIES:RIGHT vertebral artery is dominant. Normal appearance of the vertebral arteries, widely patent. SKELETON: No acute osseous process though bone windows have not been submitted. Moderate to severe cervical spondylosis. Severe C3-4 through C6-7 neural foraminal narrowing. OTHER NECK: Soft tissues of the neck are nonacute though, not tailored for evaluation. LEFT > RIGHT thyromegaly without dominant nodule. UPPER CHEST: Included lung apices are clear. No superior mediastinal lymphadenopathy. CTA HEAD FINDINGS: ANTERIOR CIRCULATION: Patent cervical internal carotid arteries, petrous, cavernous and supra clinoid internal carotid arteries. Patent anterior communicating artery. Patent anterior and middle cerebral arteries. Anterior cerebral artery  splayed by interhemispheric cyst. Severe stenosis LEFT A3 segment. No large vessel occlusion, significant stenosis, contrast extravasation or aneurysm. POSTERIOR CIRCULATION: Patent vertebral arteries, vertebrobasilar junction and basilar artery, as well as main branch vessels. Patent posterior cerebral  arteries. Moderate tandem stenosis RIGHT PCA, severe stenosis RIGHT P3 segment. Moderate tandem stenosis LEFT PCA. No large vessel occlusion, significant stenosis, contrast extravasation or aneurysm. VENOUS SINUSES: Major dural venous sinuses are patent though not tailored for evaluation on this angiographic examination. ANATOMIC VARIANTS: None. DELAYED PHASE: No abnormal intracranial enhancement. MIP images reviewed. IMPRESSION: CT HEAD: 1. No acute intracranial process. 2. Trace residual intraventricular blood products. Stable ventriculomegaly. 3. Stable congenital anomalies. CTA NECK: 1. No hemodynamically significant stenosis ICA's. Patent vertebral arteries. 2. Multilevel severe neural foraminal narrowing. CTA HEAD: 1. No emergent large vessel occlusion. 2. Severe stenosis LEFT A3 and RIGHT P3 segments. Electronically Signed   By: Awilda Metro M.D.   On: 11/05/2018 03:58      Scheduled Meds: . amLODipine  2.5 mg Oral Daily  . carvedilol  3.125 mg Oral BID WC  . cephALEXin  500 mg Oral Q12H  . escitalopram  10 mg Oral Daily  . galantamine  16 mg Oral Q breakfast  . insulin aspart  0-15 Units Subcutaneous TID WC  . insulin aspart  0-5 Units Subcutaneous QHS  . insulin glargine  15 Units Subcutaneous QHS  . levETIRAcetam  500 mg Oral BID  . lisinopril  20 mg Oral Daily  . LORazepam  0.25 mg Oral QHS  . Melatonin  3 mg Oral QHS  . mirtazapine  30 mg Oral QHS  . pantoprazole  40 mg Oral Daily  . phenytoin  300 mg Oral Daily  . potassium chloride  40 mEq Oral Q4H  . risperiDONE  2 mg Oral QHS   Continuous Infusions: . magnesium sulfate 1 - 4 g bolus IVPB 4 g (11/06/18 1057)     LOS: 4 days    Time spent: 20 minutes   Noralee Stain, DO [facility] [email or web address] Password [email or web address] 11/06/2018, 11:04 AM

## 2018-11-07 DIAGNOSIS — G309 Alzheimer's disease, unspecified: Secondary | ICD-10-CM | POA: Diagnosis not present

## 2018-11-07 DIAGNOSIS — N390 Urinary tract infection, site not specified: Secondary | ICD-10-CM | POA: Diagnosis not present

## 2018-11-07 DIAGNOSIS — R278 Other lack of coordination: Secondary | ICD-10-CM | POA: Diagnosis not present

## 2018-11-07 DIAGNOSIS — R2681 Unsteadiness on feet: Secondary | ICD-10-CM | POA: Diagnosis not present

## 2018-11-07 DIAGNOSIS — Z7982 Long term (current) use of aspirin: Secondary | ICD-10-CM | POA: Diagnosis not present

## 2018-11-07 DIAGNOSIS — M6281 Muscle weakness (generalized): Secondary | ICD-10-CM | POA: Diagnosis not present

## 2018-11-07 DIAGNOSIS — F323 Major depressive disorder, single episode, severe with psychotic features: Secondary | ICD-10-CM | POA: Diagnosis not present

## 2018-11-07 DIAGNOSIS — E870 Hyperosmolality and hypernatremia: Secondary | ICD-10-CM | POA: Diagnosis not present

## 2018-11-07 DIAGNOSIS — G4700 Insomnia, unspecified: Secondary | ICD-10-CM | POA: Diagnosis not present

## 2018-11-07 DIAGNOSIS — I2510 Atherosclerotic heart disease of native coronary artery without angina pectoris: Secondary | ICD-10-CM | POA: Diagnosis not present

## 2018-11-07 DIAGNOSIS — R293 Abnormal posture: Secondary | ICD-10-CM | POA: Diagnosis not present

## 2018-11-07 DIAGNOSIS — K219 Gastro-esophageal reflux disease without esophagitis: Secondary | ICD-10-CM | POA: Diagnosis not present

## 2018-11-07 DIAGNOSIS — I10 Essential (primary) hypertension: Secondary | ICD-10-CM | POA: Diagnosis not present

## 2018-11-07 DIAGNOSIS — K5900 Constipation, unspecified: Secondary | ICD-10-CM | POA: Diagnosis not present

## 2018-11-07 DIAGNOSIS — R509 Fever, unspecified: Secondary | ICD-10-CM | POA: Diagnosis not present

## 2018-11-07 DIAGNOSIS — F329 Major depressive disorder, single episode, unspecified: Secondary | ICD-10-CM | POA: Diagnosis not present

## 2018-11-07 DIAGNOSIS — Z743 Need for continuous supervision: Secondary | ICD-10-CM | POA: Diagnosis not present

## 2018-11-07 DIAGNOSIS — R2689 Other abnormalities of gait and mobility: Secondary | ICD-10-CM | POA: Diagnosis not present

## 2018-11-07 DIAGNOSIS — I639 Cerebral infarction, unspecified: Secondary | ICD-10-CM | POA: Diagnosis not present

## 2018-11-07 DIAGNOSIS — E785 Hyperlipidemia, unspecified: Secondary | ICD-10-CM | POA: Diagnosis not present

## 2018-11-07 DIAGNOSIS — G459 Transient cerebral ischemic attack, unspecified: Secondary | ICD-10-CM | POA: Diagnosis not present

## 2018-11-07 DIAGNOSIS — Z9181 History of falling: Secondary | ICD-10-CM | POA: Diagnosis not present

## 2018-11-07 DIAGNOSIS — E1151 Type 2 diabetes mellitus with diabetic peripheral angiopathy without gangrene: Secondary | ICD-10-CM | POA: Diagnosis not present

## 2018-11-07 DIAGNOSIS — E860 Dehydration: Secondary | ICD-10-CM | POA: Diagnosis not present

## 2018-11-07 DIAGNOSIS — N400 Enlarged prostate without lower urinary tract symptoms: Secondary | ICD-10-CM | POA: Diagnosis not present

## 2018-11-07 DIAGNOSIS — G40909 Epilepsy, unspecified, not intractable, without status epilepticus: Secondary | ICD-10-CM | POA: Diagnosis not present

## 2018-11-07 DIAGNOSIS — R279 Unspecified lack of coordination: Secondary | ICD-10-CM | POA: Diagnosis not present

## 2018-11-07 DIAGNOSIS — E1159 Type 2 diabetes mellitus with other circulatory complications: Secondary | ICD-10-CM | POA: Diagnosis not present

## 2018-11-07 DIAGNOSIS — F419 Anxiety disorder, unspecified: Secondary | ICD-10-CM | POA: Diagnosis not present

## 2018-11-07 DIAGNOSIS — R531 Weakness: Secondary | ICD-10-CM | POA: Diagnosis not present

## 2018-11-07 DIAGNOSIS — R41841 Cognitive communication deficit: Secondary | ICD-10-CM | POA: Diagnosis not present

## 2018-11-07 DIAGNOSIS — F0281 Dementia in other diseases classified elsewhere with behavioral disturbance: Secondary | ICD-10-CM | POA: Diagnosis not present

## 2018-11-07 DIAGNOSIS — G9341 Metabolic encephalopathy: Secondary | ICD-10-CM | POA: Diagnosis not present

## 2018-11-07 LAB — MAGNESIUM: Magnesium: 1.9 mg/dL (ref 1.7–2.4)

## 2018-11-07 LAB — BASIC METABOLIC PANEL
Anion gap: 9 (ref 5–15)
BUN: 10 mg/dL (ref 8–23)
CHLORIDE: 103 mmol/L (ref 98–111)
CO2: 28 mmol/L (ref 22–32)
Calcium: 8.8 mg/dL — ABNORMAL LOW (ref 8.9–10.3)
Creatinine, Ser: 0.94 mg/dL (ref 0.61–1.24)
GFR calc Af Amer: >60 mL/min (ref >60)
GFR calc non Af Amer: >60 mL/min (ref >60)
Glucose, Bld: 153 mg/dL — ABNORMAL HIGH (ref 70–99)
Potassium: 3.7 mmol/L (ref 3.5–5.1)
Sodium: 140 mmol/L (ref 135–145)

## 2018-11-07 LAB — GLUCOSE, CAPILLARY
Glucose-Capillary: 159 mg/dL — ABNORMAL HIGH (ref 70–99)
Glucose-Capillary: 220 mg/dL — ABNORMAL HIGH (ref 70–99)
Glucose-Capillary: 187 mg/dL — ABNORMAL HIGH (ref 70–99)

## 2018-11-07 MED ORDER — CEPHALEXIN 500 MG PO CAPS: 500.0000 mg | ORAL_CAPSULE | Freq: Two times a day (BID) | ORAL | 0 refills | Status: AC

## 2018-11-07 MED ORDER — PHENYTOIN SODIUM EXTENDED 100 MG PO CAPS: 300.0000 mg | ORAL_CAPSULE | Freq: Every day | ORAL | 0 refills | Status: DC

## 2018-11-07 MED ORDER — LORAZEPAM 0.5 MG PO TABS: 0.2500 mg | ORAL_TABLET | Freq: Every day | ORAL | 0 refills | Status: DC

## 2018-11-07 MED ORDER — INSULIN ASPART 100 UNIT/ML ~~LOC~~ SOLN: 0.0000 [IU] | Freq: Three times a day (TID) | SUBCUTANEOUS | 0 refills | Status: AC

## 2018-11-07 MED ORDER — ASPIRIN EC 81 MG PO TBEC: 81.0000 mg | DELAYED_RELEASE_TABLET | Freq: Every day | ORAL | 0 refills | Status: AC

## 2018-11-07 MED ORDER — INSULIN GLARGINE 100 UNIT/ML ~~LOC~~ SOLN: 15.0000 [IU] | Freq: Every day | SUBCUTANEOUS | 0 refills | Status: AC

## 2018-11-07 NOTE — Progress Notes (Signed)
Physical Therapy Treatment Patient Details Name: Arthur Berry MRN: [id number] DOB: Sep 26, 1946 Today's Date: 11/07/2018    History of Present Illness 72 y.o. male presenting with confusion. Pt with past medical history significant dementia, CKD, CAD, diabetes mellitus, history of epilepsy, and stroke. MRI incomplete due to movement; showing "possible acute/subacute infarct at left frontoparietal cortex" and "possible acute-subacute infarcts in the left frontal lobe and left insula."    PT Comments    Patient progressing well towards PT goals. Tolerated gait training today with Mod A for balance/safety and assist with RW navigation. Requires repetition and multimodal cues to perform tasks. Pt with heavy posterior lean in sitting and standing but able to correct with proper cues. Plan is to d/c to SNF today. Will follow if still in the hospital.    Follow Up Recommendations  SNF;Supervision/Assistance - 24 hour     Equipment Recommendations  None recommended by PT    Recommendations for Other Services       Precautions / Restrictions Precautions Precautions: Fall Restrictions Weight Bearing Restrictions: No    Mobility  Bed Mobility Overal bed mobility: Needs Assistance Bed Mobility: Supine to Sit     Supine to sit: Mod assist;HOB elevated     General bed mobility comments: cues to bring BLEs towards EOB. Mod A for elevating trunk and then bringing hips towards EOB with bed pad; increased time  Transfers Overall transfer level: Needs assistance Equipment used: Rolling walker (2 wheeled) Transfers: Sit to/from Stand Sit to Stand: Mod assist;From elevated surface         General transfer comment: Mod A to power up into standing- posterior lean which pt able to correct with cues; stood from EOB x, from chair x1.   Ambulation/Gait Ambulation/Gait assistance: Mod assist;+2 safety/equipment Gait Distance (Feet): 8 Feet Assistive device: Rolling walker (2 wheeled) Gait  Pattern/deviations: Step-through pattern;Decreased stride length;Trunk flexed Gait velocity: decreased   General Gait Details: Slow, unsteady gait with assist for RW navigation and balance. Bil knee instability noted but no buckling.   Stairs             Wheelchair Mobility    Modified Rankin (Stroke Patients Only) Modified Rankin (Stroke Patients Only) Pre-Morbid Rankin Score: Moderately severe disability Modified Rankin: Moderately severe disability     Balance Overall balance assessment: Needs assistance Sitting-balance support: No upper extremity supported;Feet supported Sitting balance-Leahy Scale: Poor Sitting balance - Comments: Pt with posterior lean sitting EOB requiring UE support for static sitting balance.  Postural control: Posterior lean Standing balance support: During functional activity;Bilateral upper extremity supported Standing balance-Leahy Scale: Poor Standing balance comment: Reliant on physical A and BUE support; posterior lean                            Cognition Arousal/Alertness: Awake/alert Behavior During Therapy: Flat affect Overall Cognitive Status: History of cognitive impairments - at baseline Area of Impairment: Orientation;Attention                 Orientation Level: Disoriented to;Time;Situation;Place Current Attention Level: Focused;Sustained Memory: Decreased short-term memory Following Commands: Follows one step commands inconsistently(with increased time and repetition)     Problem Solving: Slow processing;Decreased initiation;Difficulty sequencing;Requires verbal cues;Requires tactile cues        Exercises      General Comments        Pertinent Vitals/Pain Pain Assessment: Faces Faces Pain Scale: No hurt    Home Living  Prior Function            PT Goals (current goals can now be found in the care plan section) Progress towards PT goals: Progressing toward  goals    Frequency    Min 2X/week      PT Plan Current plan remains appropriate    Co-evaluation              AM-PAC PT "6 Clicks" Mobility   Outcome Measure  Help needed turning from your back to your side while in a flat bed without using bedrails?: A Lot Help needed moving from lying on your back to sitting on the side of a flat bed without using bedrails?: A Lot Help needed moving to and from a bed to a chair (including a wheelchair)?: A Lot Help needed standing up from a chair using your arms (e.g., wheelchair or bedside chair)?: A Lot Help needed to walk in hospital room?: A Lot Help needed climbing 3-5 steps with a railing? : Total 6 Click Score: 11    End of Session Equipment Utilized During Treatment: Gait belt Activity Tolerance: Patient tolerated treatment well Patient left: in chair;with call bell/phone within reach;with chair alarm set Nurse Communication: Mobility status PT Visit Diagnosis: Other abnormalities of gait and mobility (R26.89);Muscle weakness (generalized) (M62.81);Difficulty in walking, not elsewhere classified (R26.2)     Time: [id number] PT Time Calculation (min) (ACUTE ONLY): 21 min  Charges:  $Therapeutic Activity: 8-22 mins                     Mylo RedShauna Arraya Buck, PT, DPT Acute Rehabilitation Services Pager [phone] Office [phone]       Blake DivineShauna A Lanier EnsignHartshorne 11/07/2018, 2:59 PM

## 2018-11-07 NOTE — Progress Notes (Signed)
Pt discharged to facility via ambulance. Pt is hemodynamically stable. Report given to transporters. Doyce Saling Islee, RN  

## 2018-11-07 NOTE — Progress Notes (Signed)
Report called to Luster Landsbergenee, LPN at [facility]. Dondra SpryMoore, Marley Pakula Islee, RN

## 2018-11-07 NOTE — Progress Notes (Signed)
Refusing labs. Readjusted for 0800.

## 2018-11-07 NOTE — Discharge Summary (Signed)
Physician Discharge Summary  Arthur Berry VHQ:[id number] DOB: July 16, 1946 DOA: 11/01/2018  PCP: Arthur RanPerini, Mark, MD  Admit date: 11/01/2018 Discharge date: 11/07/2018  Admitted From: ALF Disposition:  SNF  Recommendations for Outpatient Follow-up:  1. Follow up with PCP in 1 week  Discharge Condition: Stable CODE STATUS: Full  Diet recommendation: Carb modified   Brief/Interim Summary: Arthur RyderSamuel W Appleis a 72 y.o.malewithpastmedical history significantforslight confusion and underlying dementia, CKD, CAD, diabetes mellitus, history of epilepsy, stroke [facility]. He had a recent head injury 12/10 from fall and had CT head that showed no intracranial bleeding and hadsuture placed. He was again sent from nursing home due to uncontrolled blood sugar in500,worsening of his mental status, "patient was not beinghimself". Apparently patient has not been eating well. There wasno reported recent falls since the last one.  Imaging in the emergency department revealed trace intraventricular hemorrhage, acute/subacute infarct. He was evaluated by neurology for stroke. He was also revealed to have UTI and was treated with antibiotics.   Discharge Diagnoses:  Principal Problem:   Acute metabolic encephalopathy Active Problems:   HLD (hyperlipidemia)   Dehydration   Stroke (HCC)   Fall   Type 2 diabetes mellitus with other circulatory complications (HCC)   Hypernatremia   Acute metabolic encephalopathy Appears to be back to his baseline  Recurrent falls PT OT, rec SNF   Left frontal lobe and left insular punctate infarcts  MRI brain showed 2cm diffuse abnormality left frontoparietal cortex possible acute/early subacute infarct or possibly contusion, possible punctate acute/subacute infarct left frontal lobe and left insula  CTA head/neck showed no hemodynamically significant stenosis or large vessel occlusion. Showed severe stenosis left A1 and right P3 segments   Neurology consulted, patient not good candidate for anticoagulation so did not pursue TEE or loop recorder  Held aspirin due to intraventricular hemorrhage, okay to resume at discharge per Neuro  PT OT SLP   Intraventricular hemorrhage On-call neurosurgeon does not feel it is clinically significant, had no other neurosurgical interventions offered at this time Held aspirin due to intraventricular hemorrhage, okay to resume at discharge per Neuro   Hypernatremia Suspect secondary to dehydration, poor oral intake Resolved Encourage PO intake   Diabetes mellitus type 2 with uncontrolled hyperglycemia Hemoglobin A1c 9.2  On metformin as outpatient, continue  Lantus, Novolog SSI - titrate as needed at SNF   HTN Continue norvasc, coreg, lisinopril   History of epilepsy Continue Dilantin, Keppra Increased dilantin to 300mg  daily due to low level  Stop tramadol, lowers seizure threshold   Mood disorder Continue Lexapro, galantamine, Ativan, Remeron, Risperdal  Proteus mirabilis UTI Deescalate to keflex     Discharge Instructions  Discharge Instructions    Diet Carb Modified   Complete by:  As directed    Increase activity slowly   Complete by:  As directed      Allergies as of 11/07/2018   No Known Allergies     Medication List    STOP taking these medications   aspirin 325 MG tablet Replaced by:  aspirin EC 81 MG tablet   traMADol 50 MG tablet Commonly known as:  ULTRAM     TAKE these medications   amLODipine 2.5 MG tablet Commonly known as:  NORVASC Take 2.5 mg by mouth daily.   aspirin EC 81 MG tablet Take 1 tablet (81 mg total) by mouth daily. Replaces:  aspirin 325 MG tablet   carvedilol 3.125 MG tablet Commonly known as:  COREG Take 3.125  mg by mouth 2 (two) times daily with a meal.   cephALEXin 500 MG capsule Commonly known as:  KEFLEX Take 1 capsule (500 mg total) by mouth every 12 (twelve) hours for 3 days.   escitalopram 10 MG  tablet Commonly known as:  LEXAPRO Take 10 mg by mouth daily.   galantamine 16 MG 24 hr capsule Commonly known as:  RAZADYNE ER Take 16 mg by mouth daily with breakfast.   insulin aspart 100 UNIT/ML injection Commonly known as:  novoLOG Inject 0-15 Units into the skin 4 (four) times daily -  before meals and at bedtime. Mealtime sliding scale: Blood Glucose 121 - 150: 1 units  BG 151 - 200: 2 units  BG 201 - 250: 3 units  BG 251 - 300: 5 units  BG 301 - 350: 7 units  BG 351 - 400: 9 units   Bedtime sliding scale: BG 70 - 200: 0 units  BG 201 - 250: 2 units  BG 251 - 300: 3 units  BG 301 - 350: 4 units  BG 351 - 400: 5 units   insulin glargine 100 UNIT/ML injection Commonly known as:  LANTUS Inject 0.15 mLs (15 Units total) into the skin at bedtime.   levETIRAcetam 500 MG tablet Commonly known as:  KEPPRA Take 500 mg by mouth 2 (two) times daily.   lisinopril 20 MG tablet Commonly known as:  PRINIVIL,ZESTRIL Take 20 mg by mouth daily.   LORazepam 0.5 MG tablet Commonly known as:  ATIVAN Take 0.5 tablets (0.25 mg total) by mouth at bedtime.   magnesium hydroxide 400 MG/5ML suspension Commonly known as:  MILK OF MAGNESIA Take 30 mLs by mouth at bedtime as needed for mild constipation.   magnesium oxide 400 MG tablet Commonly known as:  MAG-OX Take 400 mg by mouth daily.   Melatonin 3 MG Tabs Take 3 mg by mouth at bedtime.   metFORMIN 500 MG tablet Commonly known as:  GLUCOPHAGE Take 500 mg by mouth 2 (two) times daily.   mirtazapine 30 MG tablet Commonly known as:  REMERON Take 30 mg by mouth at bedtime.   neomycin-bacitracin-polymyxin ointment Commonly known as:  NEOSPORIN Apply 1 application topically daily as needed for wound care.   pantoprazole 40 MG tablet Commonly known as:  PROTONIX Take 1 tablet (40 mg total) by mouth daily.   phenytoin 100 MG ER capsule Commonly known as:  DILANTIN Take 3 capsules (300 mg total) by mouth daily. What  changed:  how much to take   risperiDONE 2 MG tablet Commonly known as:  RISPERDAL Take 2 mg by mouth at bedtime.   VITAMIN D3 PO Take 1 tablet by mouth daily.       Contact information for follow-up providers    Arthur Ran, MD. Schedule an appointment as soon as possible for a visit in 1 week(s).   Specialty:  Internal Medicine Contact information: [address] [place] [address] [phone]            Contact information for after-discharge care    Destination    [facility] Preferred [facility]F .   Service:  Skilled Nursing Contact information: [address] [place] [address] [phone]                 No Known Allergies  Consultations:  Neurology    Procedures/Studies: Ct Angio Head W Or Wo Contrast  Result Date: 11/05/2018 CLINICAL DATA:  Vision changes, follow up stroke. History  of stroke, hypertension, seizures, hypertension, hyperlipidemia. EXAM: CT ANGIOGRAPHY HEAD AND NECK TECHNIQUE: Multidetector CT imaging of the head and neck was performed using the standard protocol during bolus administration of intravenous contrast. Multiplanar CT image reconstructions and MIPs were obtained to evaluate the vascular anatomy. Carotid stenosis measurements (when applicable) are obtained utilizing NASCET criteria, using the distal internal carotid diameter as the denominator. CONTRAST:  75mL ISOVUE-370 IOPAMIDOL (ISOVUE-370) INJECTION 76% COMPARISON:  MRI head November 01, 2018 and CT HEAD November 01, 2018 and CT angiogram head and neck osseous fourth 2016 FINDINGS: CT HEAD FINDINGS BRAIN: No intraparenchymal hemorrhage, mass effect nor midline shift. Severe ventriculomegaly with agenesis of corpus callosum and colpocephaly. Minimal dependent blood products within occipital horns. LEFT para median cyst in additional to suspected RIGHT mesial frontal encephalomalacia versus schizencephaly. Periventricular white matter  hypodensities seen with gliosis, chronic small vessel ischemic changes and/or interstitial edema. Moderate cerebellar atrophy. No midline shift. No acute large vascular territory infarcts. VASCULAR: Moderate calcific atherosclerosis of the carotid siphons. SKULL: No skull fracture. No significant scalp soft tissue swelling. SINUSES/ORBITS: Mild paranasal sinus mucosal thickening. Mastoid air cells are well aerated.The included ocular globes and orbital contents are non-suspicious. OTHER: None. CTA NECK FINDINGS: AORTIC ARCH: Normal appearance of the thoracic arch, normal branch pattern. Mild calcific atherosclerosis aortic arch. The origins of the innominate, left Common carotid artery and subclavian artery are widely patent. RIGHT CAROTID SYSTEM: Common carotid artery is patent. Mild calcific atherosclerosis of the carotid bifurcation without hemodynamically significant stenosis by NASCET criteria. Normal appearance of the internal carotid artery. LEFT CAROTID SYSTEM: Common carotid artery is patent. Mild calcific atherosclerosis of the carotid bifurcation without hemodynamically significant stenosis by NASCET criteria. Patent common developmentally smaller. VERTEBRAL ARTERIES:RIGHT vertebral artery is dominant. Normal appearance of the vertebral arteries, widely patent. SKELETON: No acute osseous process though bone windows have not been submitted. Moderate to severe cervical spondylosis. Severe C3-4 through C6-7 neural foraminal narrowing. OTHER NECK: Soft tissues of the neck are nonacute though, not tailored for evaluation. LEFT > RIGHT thyromegaly without dominant nodule. UPPER CHEST: Included lung apices are clear. No superior mediastinal lymphadenopathy. CTA HEAD FINDINGS: ANTERIOR CIRCULATION: Patent cervical internal carotid arteries, petrous, cavernous and supra clinoid internal carotid arteries. Patent anterior communicating artery. Patent anterior and middle cerebral arteries. Anterior cerebral artery  splayed by interhemispheric cyst. Severe stenosis LEFT A3 segment. No large vessel occlusion, significant stenosis, contrast extravasation or aneurysm. POSTERIOR CIRCULATION: Patent vertebral arteries, vertebrobasilar junction and basilar artery, as well as main branch vessels. Patent posterior cerebral arteries. Moderate tandem stenosis RIGHT PCA, severe stenosis RIGHT P3 segment. Moderate tandem stenosis LEFT PCA. No large vessel occlusion, significant stenosis, contrast extravasation or aneurysm. VENOUS SINUSES: Major dural venous sinuses are patent though not tailored for evaluation on this angiographic examination. ANATOMIC VARIANTS: None. DELAYED PHASE: No abnormal intracranial enhancement. MIP images reviewed. IMPRESSION: CT HEAD: 1. No acute intracranial process. 2. Trace residual intraventricular blood products. Stable ventriculomegaly. 3. Stable congenital anomalies. CTA NECK: 1. No hemodynamically significant stenosis ICA's. Patent vertebral arteries. 2. Multilevel severe neural foraminal narrowing. CTA HEAD: 1. No emergent large vessel occlusion. 2. Severe stenosis LEFT A3 and RIGHT P3 segments. Electronically Signed   By: Awilda Metro M.D.   On: 11/05/2018 03:58   Dg Chest 1 View  Result Date: 11/01/2018 CLINICAL DATA:  Altered mental status, hypertension EXAM: CHEST  1 VIEW COMPARISON:  06/01/2018 FINDINGS: Heart and mediastinal contours are within normal limits. No focal opacities or effusions. No acute bony  abnormality. IMPRESSION: No active disease. Electronically Signed   By: Charlett Nose M.D.   On: 11/01/2018 11:06   Ct Head Wo Contrast  Result Date: 11/01/2018 CLINICAL DATA:  Altered level of consciousness. Hyperglycemia. History of dementia. EXAM: CT HEAD WITHOUT CONTRAST TECHNIQUE: Contiguous axial images were obtained from the base of the skull through the vertex without intravenous contrast. COMPARISON:  10/22/2018 FINDINGS: Brain: Trace hyperdense blood products are present in  the occipital horns of the lateral ventricles, new from the prior study. There may be a 3 mm focus of petechial hemorrhage in the left frontal lobe along the lateral margin of the cyst. No intracranial hemorrhage is identified elsewhere. Congenital brain anomalies are again noted including agenesis of the corpus callosum, colpocephaly, and an interhemispheric cyst centered left of midline. The ventricles are unchanged in size and configuration. Patchy cerebral white matter hypodensities are unchanged and nonspecific but may reflect mild chronic small vessel ischemic disease. There is mild cerebellar atrophy. No acute infarct or extra-axial fluid collection is identified. Vascular: Calcified atherosclerosis at the skull base. No hyperdense vessel. Skull: No fracture or focal osseous lesion. Sinuses/Orbits: Minimal mucosal thickening in the ethmoid air cells. Clear mastoid air cells. Unremarkable orbits. Other: Decreased size of right frontal scalp hematoma. IMPRESSION: 1. Trace intraventricular hemorrhage. 2. Possible 3 mm petechial hemorrhage in the left frontal lobe. 3. Decreased size of frontal scalp hematoma. Critical Value/emergent results were called by telephone at the time of interpretation on 11/01/2018 at 1:22 pm to Dr. Chaney Malling , who verbally acknowledged these results. Electronically Signed   By: Sebastian Ache M.D.   On: 11/01/2018 13:22   Ct Head Wo Contrast  Addendum Date: 10/22/2018   ADDENDUM REPORT: 10/22/2018 18:05 ADDENDUM: Right forehead hematoma is noted without underlying bony abnormality. Electronically Signed   By: Alcide Clever M.D.   On: 10/22/2018 18:05   Result Date: 10/22/2018 CLINICAL DATA:  Unwitnessed fall EXAM: CT HEAD WITHOUT CONTRAST CT CERVICAL SPINE WITHOUT CONTRAST TECHNIQUE: Multidetector CT imaging of the head and cervical spine was performed following the standard protocol without intravenous contrast. Multiplanar CT image reconstructions of the cervical spine were also  generated. COMPARISON:  06/23/2018 FINDINGS: CT HEAD FINDINGS Brain: There again noted stable changes of agenesis of the corpus callosum with ventriculomegaly and Colpocephaly. The appearance is similar to that seen on the prior study. No findings to suggest acute hemorrhage, acute infarction or space-occupying mass lesion are seen. Stable chronic white matter ischemic changes are noted. Vascular: No hyperdense vessel or unexpected calcification. Skull: Normal. Negative for fracture or focal lesion. Sinuses/Orbits: Soft tissue swelling is noted in the right forehead with some air consistent with laceration. Other: None CT CERVICAL SPINE FINDINGS Alignment: Within normal limits. Skull base and vertebrae: 7 cervical segments are well visualized. Vertebral body height is well maintained. No acute fracture or acute facet abnormality is noted. Multilevel disc space narrowing from C3-C7 is seen with associated osteophytic changes. Multilevel facet hypertrophic changes are noted as well. Soft tissues and spinal canal: Surrounding soft tissues show vascular calcifications. Stable enlargement of the left thyroid with associated calcifications are seen. Stable ligamentous calcifications are noted posteriorly. Upper chest: Within normal limits. Other: None IMPRESSION: CT of the head: Chronic changes as described above stable from the previous exam. CT of the cervical spine: Degenerative change stable from the prior exam. Stable nodular changes in the left thyroid. Electronically Signed: By: Alcide Clever M.D. On: 10/22/2018 17:56   Ct Angio Neck W Or  Wo Contrast  Result Date: 11/05/2018 CLINICAL DATA:  Vision changes, follow up stroke. History of stroke, hypertension, seizures, hypertension, hyperlipidemia. EXAM: CT ANGIOGRAPHY HEAD AND NECK TECHNIQUE: Multidetector CT imaging of the head and neck was performed using the standard protocol during bolus administration of intravenous contrast. Multiplanar CT image  reconstructions and MIPs were obtained to evaluate the vascular anatomy. Carotid stenosis measurements (when applicable) are obtained utilizing NASCET criteria, using the distal internal carotid diameter as the denominator. CONTRAST:  75mL ISOVUE-370 IOPAMIDOL (ISOVUE-370) INJECTION 76% COMPARISON:  MRI head November 01, 2018 and CT HEAD November 01, 2018 and CT angiogram head and neck osseous fourth 2016 FINDINGS: CT HEAD FINDINGS BRAIN: No intraparenchymal hemorrhage, mass effect nor midline shift. Severe ventriculomegaly with agenesis of corpus callosum and colpocephaly. Minimal dependent blood products within occipital horns. LEFT para median cyst in additional to suspected RIGHT mesial frontal encephalomalacia versus schizencephaly. Periventricular white matter hypodensities seen with gliosis, chronic small vessel ischemic changes and/or interstitial edema. Moderate cerebellar atrophy. No midline shift. No acute large vascular territory infarcts. VASCULAR: Moderate calcific atherosclerosis of the carotid siphons. SKULL: No skull fracture. No significant scalp soft tissue swelling. SINUSES/ORBITS: Mild paranasal sinus mucosal thickening. Mastoid air cells are well aerated.The included ocular globes and orbital contents are non-suspicious. OTHER: None. CTA NECK FINDINGS: AORTIC ARCH: Normal appearance of the thoracic arch, normal branch pattern. Mild calcific atherosclerosis aortic arch. The origins of the innominate, left Common carotid artery and subclavian artery are widely patent. RIGHT CAROTID SYSTEM: Common carotid artery is patent. Mild calcific atherosclerosis of the carotid bifurcation without hemodynamically significant stenosis by NASCET criteria. Normal appearance of the internal carotid artery. LEFT CAROTID SYSTEM: Common carotid artery is patent. Mild calcific atherosclerosis of the carotid bifurcation without hemodynamically significant stenosis by NASCET criteria. Patent common developmentally  smaller. VERTEBRAL ARTERIES:RIGHT vertebral artery is dominant. Normal appearance of the vertebral arteries, widely patent. SKELETON: No acute osseous process though bone windows have not been submitted. Moderate to severe cervical spondylosis. Severe C3-4 through C6-7 neural foraminal narrowing. OTHER NECK: Soft tissues of the neck are nonacute though, not tailored for evaluation. LEFT > RIGHT thyromegaly without dominant nodule. UPPER CHEST: Included lung apices are clear. No superior mediastinal lymphadenopathy. CTA HEAD FINDINGS: ANTERIOR CIRCULATION: Patent cervical internal carotid arteries, petrous, cavernous and supra clinoid internal carotid arteries. Patent anterior communicating artery. Patent anterior and middle cerebral arteries. Anterior cerebral artery splayed by interhemispheric cyst. Severe stenosis LEFT A3 segment. No large vessel occlusion, significant stenosis, contrast extravasation or aneurysm. POSTERIOR CIRCULATION: Patent vertebral arteries, vertebrobasilar junction and basilar artery, as well as main branch vessels. Patent posterior cerebral arteries. Moderate tandem stenosis RIGHT PCA, severe stenosis RIGHT P3 segment. Moderate tandem stenosis LEFT PCA. No large vessel occlusion, significant stenosis, contrast extravasation or aneurysm. VENOUS SINUSES: Major dural venous sinuses are patent though not tailored for evaluation on this angiographic examination. ANATOMIC VARIANTS: None. DELAYED PHASE: No abnormal intracranial enhancement. MIP images reviewed. IMPRESSION: CT HEAD: 1. No acute intracranial process. 2. Trace residual intraventricular blood products. Stable ventriculomegaly. 3. Stable congenital anomalies. CTA NECK: 1. No hemodynamically significant stenosis ICA's. Patent vertebral arteries. 2. Multilevel severe neural foraminal narrowing. CTA HEAD: 1. No emergent large vessel occlusion. 2. Severe stenosis LEFT A3 and RIGHT P3 segments. Electronically Signed   By: Awilda Metro  M.D.   On: 11/05/2018 03:58   Ct Cervical Spine Wo Contrast  Addendum Date: 10/22/2018   ADDENDUM REPORT: 10/22/2018 18:05 ADDENDUM: Right forehead hematoma is noted without underlying bony  abnormality. Electronically Signed   By: Alcide CleverMark  Lukens M.D.   On: 10/22/2018 18:05   Result Date: 10/22/2018 CLINICAL DATA:  Unwitnessed fall EXAM: CT HEAD WITHOUT CONTRAST CT CERVICAL SPINE WITHOUT CONTRAST TECHNIQUE: Multidetector CT imaging of the head and cervical spine was performed following the standard protocol without intravenous contrast. Multiplanar CT image reconstructions of the cervical spine were also generated. COMPARISON:  06/23/2018 FINDINGS: CT HEAD FINDINGS Brain: There again noted stable changes of agenesis of the corpus callosum with ventriculomegaly and Colpocephaly. The appearance is similar to that seen on the prior study. No findings to suggest acute hemorrhage, acute infarction or space-occupying mass lesion are seen. Stable chronic white matter ischemic changes are noted. Vascular: No hyperdense vessel or unexpected calcification. Skull: Normal. Negative for fracture or focal lesion. Sinuses/Orbits: Soft tissue swelling is noted in the right forehead with some air consistent with laceration. Other: None CT CERVICAL SPINE FINDINGS Alignment: Within normal limits. Skull base and vertebrae: 7 cervical segments are well visualized. Vertebral body height is well maintained. No acute fracture or acute facet abnormality is noted. Multilevel disc space narrowing from C3-C7 is seen with associated osteophytic changes. Multilevel facet hypertrophic changes are noted as well. Soft tissues and spinal canal: Surrounding soft tissues show vascular calcifications. Stable enlargement of the left thyroid with associated calcifications are seen. Stable ligamentous calcifications are noted posteriorly. Upper chest: Within normal limits. Other: None IMPRESSION: CT of the head: Chronic changes as described above  stable from the previous exam. CT of the cervical spine: Degenerative change stable from the prior exam. Stable nodular changes in the left thyroid. Electronically Signed: By: Alcide CleverMark  Lukens M.D. On: 10/22/2018 17:56   [name]  Result Date: 11/01/2018 CLINICAL DATA:  Altered mental status. Hyperglycemia. History of dementia. Fall 10 days ago. EXAM: MRI HEAD WITHOUT CONTRAST TECHNIQUE: Multiplanar, multiecho pulse sequences of the brain and surrounding structures were obtained without intravenous contrast. COMPARISON:  Head CT 11/01/2018 and MRI 06/16/2015 FINDINGS: The patient was unable to tolerate the complete examination. Axial and coronal diffusion, sagittal T1, and axial T2 sequences were obtained. The axial T2 sequence is moderately motion degraded, and there is mild motion on the diffusion imaging. Brain: There is a 2 cm region of abnormal trace diffusion signal with normal to mildly reduced ADC involving left frontoparietal cortex at the vertex suggesting an acute to early subacute infarct. Two additional punctate foci of abnormal diffusion signal are noted in the left frontal lobe along the margin of the interhemispheric cyst and in the left insula and may also represent recent tiny infarcts although diffusion signal in the left frontal lobe corresponds to the questioned petechial hemorrhage on CT and could be artifactual. Minimal signal abnormality dependently in the occipital horns of both lateral ventricles corresponds to the trace hemorrhage reported on earlier CT. Chronic changes including agenesis of the corpus callosum, a large inter hemispheric cyst extending left of midline, and cerebellar atrophy are again noted. There is colpocephaly with unchanged size and configuration of the lateral ventricles. Chronic cerebral white matter disease is incompletely evaluated. Vascular: Major intracranial arterial flow voids are grossly preserved. T2 hyperintensity in the non dominant left  sigmoid sinus may reflect slow flow or less likely occlusion. Skull and upper cervical spine: No suspicious marrow lesion. Sinuses/Orbits: Grossly unremarkable orbits.  Clear sinuses. Other: Small right frontal scalp hematoma. IMPRESSION: 1. Incomplete, motion degraded examination. 2. 2 cm diffusion abnormality involving left frontoparietal cortex at the vertex which may reflect an  acute/early subacute infarct or possibly contusion. 3. Possible additional punctate acute to early subacute infarcts in the left frontal lobe and left insula. 4. Trace intraventricular hemorrhage. 5. Chronic changes as above. Electronically Signed   By: Sebastian Ache M.D.   On: 11/01/2018 14:10   Xr Hip Unilat W Or W/o Pelvis 2-3 Views Right  Result Date: 10/21/2018 AP pelvis lateral view right hip.  Hip components well-seated no signs of loosening or hardware failure.  Significant heterotopic calcification about the right hip.  Bilateral hips well located.  No acute fracture   Echo Study Conclusions  - Left ventricle: The cavity size was normal. Wall thickness was   increased in a pattern of mild LVH. Systolic function was normal.   The estimated ejection fraction was in the range of 60% to 65%.   Wall motion was normal; there were no regional wall motion   abnormalities. Left ventricular diastolic function parameters   were normal. - Atrial septum: No defect or patent foramen ovale was identified.    Discharge Exam: Vitals:   11/06/18 1606 11/07/18 0451  BP: 127/72 (!) 150/81  Pulse: (!) 59 69  Resp: 20 19  Temp: 98 F (36.7 C) 98.5 F (36.9 C)  SpO2: 99% 97%    General: Pt is alert, awake, not in acute distress Cardiovascular: RRR, S1/S2 +, no rubs, no gallops Respiratory: CTA bilaterally, no wheezing, no rhonchi Abdominal: Soft, NT, ND, bowel sounds + Extremities: no edema, no cyanosis    The results of significant diagnostics from this hospitalization (including imaging, microbiology, ancillary  and laboratory) are listed below for reference.     Microbiology: Recent Results (from the past 240 hour(s))  MRSA PCR Screening     Status: None   Collection Time: 11/01/18  5:15 PM  Result Value Ref Range Status   MRSA by PCR NEGATIVE NEGATIVE Final    Comment:        The GeneXpert MRSA Assay (FDA approved for NASAL specimens only), is one component of a comprehensive MRSA colonization surveillance program. It is not intended to diagnose MRSA infection nor to guide or monitor treatment for MRSA infections. Performed at [facility], [address] [place], [place] [address]   Culture, Urine     Status: Abnormal   Collection Time: 11/04/18  6:40 PM  Result Value Ref Range Status   Specimen Description URINE, CATHETERIZED  Final   Special Requests   Final    NONE Performed at [facility], [address], [place], [place] [address]    Culture >=100,000 COLONIES/mL PROTEUS MIRABILIS (A)  Final   Report Status 11/06/2018 FINAL  Final   Organism ID, Bacteria PROTEUS MIRABILIS (A)  Final      Susceptibility   Proteus mirabilis - MIC*    AMPICILLIN <=2 SENSITIVE Sensitive     CEFAZOLIN <=4 SENSITIVE Sensitive     CEFTRIAXONE <=1 SENSITIVE Sensitive     CIPROFLOXACIN 1 SENSITIVE Sensitive     GENTAMICIN <=1 SENSITIVE Sensitive     IMIPENEM 1 SENSITIVE Sensitive     NITROFURANTOIN 128 RESISTANT Resistant     TRIMETH/SULFA <=20 SENSITIVE Sensitive     AMPICILLIN/SULBACTAM <=2 SENSITIVE Sensitive     PIP/TAZO <=4 SENSITIVE Sensitive     * >=100,000 COLONIES/mL PROTEUS MIRABILIS     Labs: BNP (last 3 results) No results for input(s): BNP in the last 8760 hours. Basic Metabolic Panel: Recent Labs  Lab 11/03/18 0427 11/04/18 0450 11/05/18 0518 11/06/18  9528 11/07/18 0714  NA 138 143 143 143 140  K 3.1* 3.1* 3.3* 3.3* 3.7  CL 104 106 106 105 103  CO2 24 29 28 28 28   GLUCOSE 301* 236* 123* 143* 153*  BUN 21 14 15 11 10   CREATININE 1.01 0.93 0.98 0.97  0.94  CALCIUM 8.7* 9.0 8.7* 8.9 8.8*  MG 1.4* 1.6* 1.7 1.6* 1.9   Liver Function Tests: Recent Labs  Lab 11/01/18 1036  AST 12*  ALT 16  ALKPHOS 162*  BILITOT 0.6  PROT 7.0  ALBUMIN 3.7   No results for input(s): LIPASE, AMYLASE in the last 168 hours. No results for input(s): AMMONIA in the last 168 hours. CBC: Recent Labs  Lab 11/01/18 1036 11/02/18 0507 11/03/18 0427  WBC 11.2* 12.4* 10.0  NEUTROABS 7.7  --   --   HGB 12.7* 11.4* 10.6*  HCT 40.5 34.2* 32.5*  MCV 100.0 97.7 95.0  PLT 150 137* 130*   Cardiac Enzymes: No results for input(s): CKTOTAL, CKMB, CKMBINDEX, TROPONINI in the last 168 hours. BNP: Invalid input(s): POCBNP CBG: Recent Labs  Lab 11/06/18 0715 11/06/18 1128 11/06/18 1606 11/06/18 2046 11/07/18 0728  GLUCAP 139* 252* 218* 149* 159*   D-Dimer No results for input(s): DDIMER in the last 72 hours. Hgb A1c No results for input(s): HGBA1C in the last 72 hours. Lipid Profile No results for input(s): CHOL, HDL, LDLCALC, TRIG, CHOLHDL, LDLDIRECT in the last 72 hours. Thyroid function studies No results for input(s): TSH, T4TOTAL, T3FREE, THYROIDAB in the last 72 hours.  Invalid input(s): FREET3 Anemia work up No results for input(s): VITAMINB12, FOLATE, FERRITIN, TIBC, IRON, RETICCTPCT in the last 72 hours. Urinalysis    Component Value Date/Time   COLORURINE AMBER (A) 11/04/2018 1855   APPEARANCEUR HAZY (A) 11/04/2018 1855   LABSPEC 1.027 11/04/2018 1855   PHURINE 5.0 11/04/2018 1855   GLUCOSEU >=500 (A) 11/04/2018 1855   HGBUR SMALL (A) 11/04/2018 1855   BILIRUBINUR SMALL (A) 11/04/2018 1855   KETONESUR NEGATIVE 11/04/2018 1855   PROTEINUR 30 (A) 11/04/2018 1855   UROBILINOGEN 0.2 06/15/2015 2200   NITRITE POSITIVE (A) 11/04/2018 1855   LEUKOCYTESUR SMALL (A) 11/04/2018 1855   Sepsis Labs Invalid input(s): PROCALCITONIN,  WBC,  LACTICIDVEN Microbiology Recent Results (from the past 240 hour(s))  MRSA PCR Screening     Status:  None   Collection Time: 11/01/18  5:15 PM  Result Value Ref Range Status   MRSA by PCR NEGATIVE NEGATIVE Final    Comment:        The GeneXpert MRSA Assay (FDA approved for NASAL specimens only), is one component of a comprehensive MRSA colonization surveillance program. It is not intended to diagnose MRSA infection nor to guide or monitor treatment for MRSA infections. Performed at [facility], [address], [place], [place] [address]   Culture, Urine     Status: Abnormal   Collection Time: 11/04/18  6:40 PM  Result Value Ref Range Status   Specimen Description URINE, CATHETERIZED  Final   Special Requests   Final    NONE Performed at [facility], [address], [place], [place] [address]    Culture >=100,000 COLONIES/mL PROTEUS MIRABILIS (A)  Final   Report Status 11/06/2018 FINAL  Final   Organism ID, Bacteria PROTEUS MIRABILIS (A)  Final      Susceptibility   Proteus mirabilis - MIC*    AMPICILLIN <=2 SENSITIVE Sensitive     CEFAZOLIN <=4 SENSITIVE Sensitive  CEFTRIAXONE <=1 SENSITIVE Sensitive     CIPROFLOXACIN 1 SENSITIVE Sensitive     GENTAMICIN <=1 SENSITIVE Sensitive     IMIPENEM 1 SENSITIVE Sensitive     NITROFURANTOIN 128 RESISTANT Resistant     TRIMETH/SULFA <=20 SENSITIVE Sensitive     AMPICILLIN/SULBACTAM <=2 SENSITIVE Sensitive     PIP/TAZO <=4 SENSITIVE Sensitive     * >=100,000 COLONIES/mL PROTEUS MIRABILIS     Patient was seen and examined on the day of discharge and was found to be in stable condition. Time coordinating discharge: 40 minutes including assessment and coordination of care, as well as examination of the patient.   SIGNED:  Noralee Stain, DO [facility] Pager [phone]  If 7PM-7AM, please contact night-coverage [email or web address] Password [facility] 11/07/2018, 8:39 AM

## 2018-11-07 NOTE — Clinical Social Work Placement (Signed)
   CLINICAL SOCIAL WORK PLACEMENT  NOTE 11/07/18 - DISCHARGED TO [facility] VIA AMBULANCE  Date:  11/07/2018  Patient Details  Name: Arthur Berry MRN: [id number] Date of Birth: 15-Jan-1946  Clinical Social Work is seeking post-discharge placement for this patient at the Skilled  Nursing Facility level of care (*CSW will initial, date and re-position this form in  chart as items are completed):  No(Family/patient provided CSW with facility preference)   Patient/family provided with [facility] Clinical Social Work Department's list of facilities offering this level of care within the geographic area requested by the patient (or if unable, by the patient's family).  Yes   Patient/family informed of their freedom to choose among providers that offer the needed level of care, that participate in Medicare, Medicaid or managed care program needed by the patient, have an available bed and are willing to accept the patient.  No   Patient/family informed of [facility]'s ownership interest in [facility] and [facility], as well as of the fact that they are under no obligation to receive care at these facilities.  PASRR submitted to EDS on       PASRR number received on       Existing PASRR number confirmed on 11/03/18     FL2 transmitted to all facilities in geographic area requested by pt/family on 11/03/18     FL2 transmitted to all facilities within larger geographic area on       Patient informed that his/her managed care company has contracts with or will negotiate with certain facilities, including the following:        Yes   Patient/family informed of bed offers received.  Patient chooses bed at [facility]     Physician recommends and patient chooses bed at      Patient to be transferred to [facility] on 11/07/18.  Patient to be transferred to facility by Ambulance     Patient family notified on 11/07/18 of transfer.  Name  of family member notified:  Brother Lorella NimrodHarvey Tramell by phone - [phone]     PHYSICIAN       Additional Comment:    _______________________________________________ Cristobal Goldmannrawford, Taylah Dubiel Bradley, LCSW 11/07/2018, 5:50 PM

## 2018-11-10 DIAGNOSIS — R531 Weakness: Secondary | ICD-10-CM | POA: Diagnosis not present

## 2018-11-10 DIAGNOSIS — E1151 Type 2 diabetes mellitus with diabetic peripheral angiopathy without gangrene: Secondary | ICD-10-CM | POA: Diagnosis not present

## 2018-11-10 DIAGNOSIS — G309 Alzheimer's disease, unspecified: Secondary | ICD-10-CM | POA: Diagnosis not present

## 2018-11-10 DIAGNOSIS — I2510 Atherosclerotic heart disease of native coronary artery without angina pectoris: Secondary | ICD-10-CM | POA: Diagnosis not present

## 2018-11-10 DIAGNOSIS — G40909 Epilepsy, unspecified, not intractable, without status epilepticus: Secondary | ICD-10-CM | POA: Diagnosis not present

## 2018-11-10 DIAGNOSIS — E860 Dehydration: Secondary | ICD-10-CM | POA: Diagnosis not present

## 2018-11-10 DIAGNOSIS — R2689 Other abnormalities of gait and mobility: Secondary | ICD-10-CM | POA: Diagnosis not present

## 2018-11-14 ENCOUNTER — Other Ambulatory Visit: Payer: Self-pay | Admitting: *Deleted

## 2018-11-14 NOTE — Patient Outreach (Signed)
[facility]) Care Management  11/14/2018  Bach Rocchi Esses 11/19/1945 [id number]   Facility site visit to [facility] in [place].  Met with UM Team member who gave update on patient.  UM Team member provided that patient is confused and is a resident at [facility] and plans to return at discharge. [facility] provides meals, medication assistance and transportation.  Patient not appropriate for [facility] Management services at this time.  Will continue communication with UM team if patient's D/C plan changes will engage.  Rutherford Limerick RN, BSN [facility] Acute Care Coordinator [phone]) Business Mobile [phone]) Toll free office

## 2018-11-28 ENCOUNTER — Other Ambulatory Visit: Payer: Self-pay | Admitting: *Deleted

## 2018-11-28 NOTE — Patient Outreach (Signed)
[facility]) Care Management  11/28/2018  Rithwik Lorenzano Mackel 1946-06-07 [id number]   Attended interdisciplinary team meeting at [name] with [facility] UM team member Fuller Plan to discuss patient's progress and plan for transitioning to home.  Discharge disposition is [facility] instead of [facility] care. Confirmed with [facility] discharge planner patient will have all services provided by this facility.   Patient not appropriate for [facility] Care Management services at this time.   Costella Hatcher RN, BSN [facility]  Post Acute Care Coordinator [phone]) Business Mobile [phone]) Toll free office

## 2018-12-09 DIAGNOSIS — I63532 Cerebral infarction due to unspecified occlusion or stenosis of left posterior cerebral artery: Secondary | ICD-10-CM | POA: Diagnosis not present

## 2018-12-09 DIAGNOSIS — E1151 Type 2 diabetes mellitus with diabetic peripheral angiopathy without gangrene: Secondary | ICD-10-CM | POA: Diagnosis not present

## 2018-12-09 DIAGNOSIS — Z87440 Personal history of urinary (tract) infections: Secondary | ICD-10-CM | POA: Diagnosis not present

## 2018-12-09 DIAGNOSIS — I2510 Atherosclerotic heart disease of native coronary artery without angina pectoris: Secondary | ICD-10-CM | POA: Diagnosis not present

## 2018-12-09 DIAGNOSIS — R2689 Other abnormalities of gait and mobility: Secondary | ICD-10-CM | POA: Diagnosis not present

## 2018-12-09 DIAGNOSIS — S0093XA Contusion of unspecified part of head, initial encounter: Secondary | ICD-10-CM | POA: Diagnosis not present

## 2018-12-09 DIAGNOSIS — E785 Hyperlipidemia, unspecified: Secondary | ICD-10-CM | POA: Diagnosis not present

## 2018-12-09 DIAGNOSIS — G4733 Obstructive sleep apnea (adult) (pediatric): Secondary | ICD-10-CM | POA: Diagnosis not present

## 2018-12-09 DIAGNOSIS — K219 Gastro-esophageal reflux disease without esophagitis: Secondary | ICD-10-CM | POA: Diagnosis not present

## 2018-12-09 DIAGNOSIS — E860 Dehydration: Secondary | ICD-10-CM | POA: Diagnosis not present

## 2018-12-09 DIAGNOSIS — N400 Enlarged prostate without lower urinary tract symptoms: Secondary | ICD-10-CM | POA: Diagnosis not present

## 2018-12-09 DIAGNOSIS — G308 Other Alzheimer's disease: Secondary | ICD-10-CM | POA: Diagnosis not present

## 2018-12-10 ENCOUNTER — Encounter (HOSPITAL_COMMUNITY): Payer: Self-pay | Admitting: Family Medicine

## 2018-12-10 ENCOUNTER — Emergency Department (HOSPITAL_COMMUNITY)
Admission: EM | Admit: 2018-12-10 | Discharge: 2018-12-11 | Disposition: A | Discharge status: Home / Self Care | Payer: Medicare Other | Attending: Emergency Medicine | Admitting: Emergency Medicine

## 2018-12-10 DIAGNOSIS — I2510 Atherosclerotic heart disease of native coronary artery without angina pectoris: Secondary | ICD-10-CM | POA: Insufficient documentation

## 2018-12-10 DIAGNOSIS — Y92129 Unspecified place in nursing home as the place of occurrence of the external cause: Secondary | ICD-10-CM | POA: Diagnosis not present

## 2018-12-10 DIAGNOSIS — E119 Type 2 diabetes mellitus without complications: Secondary | ICD-10-CM | POA: Insufficient documentation

## 2018-12-10 DIAGNOSIS — Z8673 Personal history of transient ischemic attack (TIA), and cerebral infarction without residual deficits: Secondary | ICD-10-CM | POA: Insufficient documentation

## 2018-12-10 DIAGNOSIS — Y998 Other external cause status: Secondary | ICD-10-CM | POA: Insufficient documentation

## 2018-12-10 DIAGNOSIS — R52 Pain, unspecified: Secondary | ICD-10-CM | POA: Diagnosis not present

## 2018-12-10 DIAGNOSIS — S0990XA Unspecified injury of head, initial encounter: Secondary | ICD-10-CM | POA: Diagnosis not present

## 2018-12-10 DIAGNOSIS — Z87891 Personal history of nicotine dependence: Secondary | ICD-10-CM | POA: Diagnosis not present

## 2018-12-10 DIAGNOSIS — W19XXXA Unspecified fall, initial encounter: Secondary | ICD-10-CM

## 2018-12-10 DIAGNOSIS — W01198A Fall on same level from slipping, tripping and stumbling with subsequent striking against other object, initial encounter: Secondary | ICD-10-CM | POA: Diagnosis not present

## 2018-12-10 DIAGNOSIS — Y9389 Activity, other specified: Secondary | ICD-10-CM | POA: Diagnosis not present

## 2018-12-10 DIAGNOSIS — Z79899 Other long term (current) drug therapy: Secondary | ICD-10-CM | POA: Insufficient documentation

## 2018-12-10 DIAGNOSIS — S199XXA Unspecified injury of neck, initial encounter: Secondary | ICD-10-CM | POA: Diagnosis not present

## 2018-12-10 DIAGNOSIS — I10 Essential (primary) hypertension: Secondary | ICD-10-CM | POA: Diagnosis not present

## 2018-12-10 DIAGNOSIS — M5489 Other dorsalgia: Secondary | ICD-10-CM | POA: Diagnosis not present

## 2018-12-10 DIAGNOSIS — Z794 Long term (current) use of insulin: Secondary | ICD-10-CM | POA: Diagnosis not present

## 2018-12-10 DIAGNOSIS — R51 Headache: Secondary | ICD-10-CM | POA: Diagnosis not present

## 2018-12-10 DIAGNOSIS — M542 Cervicalgia: Secondary | ICD-10-CM | POA: Diagnosis not present

## 2018-12-10 NOTE — ED Triage Notes (Signed)
Patient is from [facility] and transported via [facility]. Patient has an unwitnessed fall and complaining of lower back and neck pain. Patient was placed in c-collar and is not taking any blood thinners.

## 2018-12-10 NOTE — ED Notes (Signed)
Bed: WTR5 Expected date:  Expected time:  Means of arrival:  Comments: 

## 2018-12-11 ENCOUNTER — Emergency Department (HOSPITAL_COMMUNITY): Payer: Medicare Other

## 2018-12-11 DIAGNOSIS — S199XXA Unspecified injury of neck, initial encounter: Secondary | ICD-10-CM | POA: Diagnosis not present

## 2018-12-11 DIAGNOSIS — S0990XA Unspecified injury of head, initial encounter: Secondary | ICD-10-CM | POA: Diagnosis not present

## 2018-12-11 DIAGNOSIS — R51 Headache: Secondary | ICD-10-CM | POA: Diagnosis not present

## 2018-12-11 DIAGNOSIS — R279 Unspecified lack of coordination: Secondary | ICD-10-CM | POA: Diagnosis not present

## 2018-12-11 DIAGNOSIS — Z743 Need for continuous supervision: Secondary | ICD-10-CM | POA: Diagnosis not present

## 2018-12-11 DIAGNOSIS — W19XXXA Unspecified fall, initial encounter: Secondary | ICD-10-CM | POA: Diagnosis not present

## 2018-12-11 DIAGNOSIS — M542 Cervicalgia: Secondary | ICD-10-CM | POA: Diagnosis not present

## 2018-12-11 DIAGNOSIS — R5381 Other malaise: Secondary | ICD-10-CM | POA: Diagnosis not present

## 2018-12-11 NOTE — ED Notes (Signed)
Notified [facility] for transportation 

## 2018-12-11 NOTE — ED Provider Notes (Signed)
TIME SEEN: 12:34 AM  CHIEF COMPLAINT: Unwitnessed fall  HPI: Patient is a 73 year old male with history of CAD, CKD, hypertension, hyperlipidemia, dementia who had an unwitnessed [facility].  He denies any pain to me at this time.  Small abrasions to the forehead.    ROS: Level 5 caveat for dementia  PAST MEDICAL HISTORY/PAST SURGICAL HISTORY:  Past Medical History:  Diagnosis Date  . Adjustment reaction with aggression 05/14/2017  . Arthritis   . Chronic kidney disease   . Colon polyp 2005   Tubular Adenoma  . Coronary artery disease, non-occlusive    Minimal nonobstructive CAD, nl LV systolic fxn by cath 12/11/12  . Diabetes mellitus 2001   TYPE 2  . Diverticulosis   . Epilepsy (HCC)   . GERD (gastroesophageal reflux disease) 04/23/2014  . Goiter, nontoxic, multinodular    Thyroid US 11/2012  . Hyperlipidemia 1999  . Hypertension 1999  . Internal hemorrhoids   . Neuromuscular disorder (HCC)   . OSA (obstructive sleep apnea)   . Prostate enlargement   . Sleep apnea   . Splinter 04/27/13   metal plinter removed rt pointer finger  . Stroke (HCC) 06/2015  . Vision changes 06/2015   since CVA    MEDICATIONS:  Prior to Admission medications   Medication Sig Start Date End Date Taking? Authorizing Provider  amLODipine (NORVASC) 2.5 MG tablet Take 2.5 mg by mouth daily.    [provider]  carvedilol (COREG) 3.125 MG tablet Take 3.125 mg by mouth 2 (two) times daily with a meal.    [provider]  Cholecalciferol (VITAMIN D3 PO) Take 1 tablet by mouth daily.    [provider]  escitalopram (LEXAPRO) 10 MG tablet Take 10 mg by mouth daily.    [provider]  galantamine (RAZADYNE ER) 16 MG 24 hr capsule Take 16 mg by mouth daily with breakfast.    [provider]  insulin aspart (NOVOLOG) 100 UNIT/ML injection Inject 0-15 Units into the skin 4 (four) times daily -  before meals and at bedtime. Mealtime sliding scale: Blood  Glucose 121 - 150: 1 units  BG 151 - 200: 2 units  BG 201 - 250: 3 units  BG 251 - 300: 5 units  BG 301 - 350: 7 units  BG 351 - 400: 9 units   Bedtime sliding scale: BG 70 - 200: 0 units  BG 201 - 250: 2 units  BG 251 - 300: 3 units  BG 301 - 350: 4 units  BG 351 - 400: 5 units 11/07/18   Noralee Stainhoi, Jennifer, DO  insulin glargine (LANTUS) 100 UNIT/ML injection Inject 0.15 mLs (15 Units total) into the skin at bedtime. 11/07/18 12/07/18  Noralee Stainhoi, Jennifer, DO  levETIRAcetam (KEPPRA) 500 MG tablet Take 500 mg by mouth 2 (two) times daily.      [provider]  lisinopril (PRINIVIL,ZESTRIL) 20 MG tablet Take 20 mg by mouth daily.    [provider]  LORazepam (ATIVAN) 0.5 MG tablet Take 0.5 tablets (0.25 mg total) by mouth at bedtime. 11/07/18   Noralee Stainhoi, Jennifer, DO  magnesium hydroxide (MILK OF MAGNESIA) 400 MG/5ML suspension Take 30 mLs by mouth at bedtime as needed for mild constipation.     [provider]  magnesium oxide (MAG-OX) 400 MG tablet Take 400 mg by mouth daily.    [provider]  Melatonin 3 MG TABS Take 3 mg by mouth at bedtime.    [provider]  metFORMIN (GLUCOPHAGE) 500 MG tablet Take 500 mg by mouth 2 (two) times daily.     [provider]  mirtazapine (REMERON) 30 MG tablet Take 30 mg by mouth at bedtime.     [provider]  neomycin-bacitracin-polymyxin (NEOSPORIN) ointment Apply 1 application topically daily as needed for wound care.    [provider]  pantoprazole (PROTONIX) 40 MG tablet Take 1 tablet (40 mg total) by mouth daily. 06/22/15   Catarina Hartshorn, MD  phenytoin (DILANTIN) 100 MG ER capsule Take 3 capsules (300 mg total) by mouth daily. 11/07/18   Noralee Stain, DO  risperiDONE (RISPERDAL) 2 MG tablet Take 2 mg by mouth at bedtime.     [provider]    ALLERGIES:  No Known Allergies  SOCIAL HISTORY:  Social History   Tobacco Use  . Smoking status: Former Smoker    Types: Cigars     Last attempt to quit: 04/14/1986    Years since quitting: 32.6  . Smokeless tobacco: Never Used  . Tobacco comment: Quit cigars in the 1980s  Substance Use Topics  . Alcohol use: No    FAMILY HISTORY: Family History  Problem Relation Age of Onset  . Heart disease Mother   . Alzheimer's disease Father   . Breast cancer Sister   . Colon cancer Other        unknown  . Diabetes Sister     EXAM: BP 125/72 (BP Location: Right Arm)   Pulse 66   Temp (!) 97.3 F (36.3 C) (Oral)   Resp 18   Ht 5\' 11"  (1.803 m)   Wt 81.6 kg   SpO2 100%   BMI 25.10 kg/m  CONSTITUTIONAL: Alert and oriented to person only.  Demented.  Elderly.  In no distress. HEAD: Normocephalic; mild abrasion to the forehead EYES: Conjunctivae clear, PERRL, EOMI ENT: normal nose; no rhinorrhea; moist mucous membranes; pharynx without lesions noted; no dental injury; no septal hematoma NECK: Supple, no meningismus, no LAD; no midline spinal tenderness, step-off or deformity; trachea midline CARD: RRR; S1 and S2 appreciated; no murmurs, no clicks, no rubs, no gallops RESP: Normal chest excursion without splinting or tachypnea; breath sounds clear and equal bilaterally; no wheezes, no rhonchi, no rales; no hypoxia or respiratory distress CHEST:  chest wall stable, no crepitus or ecchymosis or deformity, nontender to palpation; no flail chest ABD/GI: Normal bowel sounds; non-distended; soft, non-tender, no rebound, no guarding; no ecchymosis or other lesions noted PELVIS:  stable, nontender to palpation BACK:  The back appears normal and is non-tender to palpation, there is no CVA tenderness; no midline spinal tenderness, step-off or deformity EXT: Normal ROM in all joints; non-tender to palpation; no edema; normal capillary refill; no cyanosis, no bony tenderness or bony deformity of patient's extremities, no joint effusion, compartments are soft, extremities are warm and well-perfused, no ecchymosis SKIN: Normal color  for age and race; warm NEURO: Moves all extremities equally, no facial asymmetry, normal speech PSYCH: The patient's mood and manner are appropriate. Grooming and personal hygiene are appropriate.  MEDICAL DECISION MAKING: Patient here after unwitnessed fall.  No complaints of pain at this time for me.  No obvious sign of trauma other than abrasions to the forehead.  Will obtain CT of the head and cervical spine.  Moving all extremities equally.  Seems to be at his neurologic baseline.  Vital signs normal.  ED PROGRESS: Patient's head CT shows no acute abnormality.  CT of the cervical spine shows no  acute osseous abnormality but there is borderline enlargement of the prevertebral soft tissues.  He does not have any neck tenderness on examination.  Does not have pain with movement of the neck.  I do not feel emergent MRI is indicated at this time.  Will discharge back to nursing facility.   At this time, I do not feel there is any life-threatening condition present. I have reviewed and discussed all results (EKG, imaging, lab, urine as appropriate) and exam findings with patient/family. I have reviewed nursing notes and appropriate previous records.  I feel the patient is safe to be discharged home without further emergent workup and can continue workup as an outpatient as needed. Discussed usual and customary return precautions. Patient/family verbalize understanding and are comfortable with this plan.  Outpatient follow-up has been provided as needed. All questions have been answered.      Macklin Jacquin, Layla MawKristen N, DO 12/11/18 [id number]

## 2018-12-11 NOTE — ED Notes (Signed)
Patient transported to CT 

## 2018-12-16 DIAGNOSIS — G4089 Other seizures: Secondary | ICD-10-CM | POA: Diagnosis not present

## 2018-12-16 DIAGNOSIS — I10 Essential (primary) hypertension: Secondary | ICD-10-CM | POA: Diagnosis not present

## 2018-12-16 DIAGNOSIS — G4700 Insomnia, unspecified: Secondary | ICD-10-CM | POA: Diagnosis not present

## 2018-12-20 ENCOUNTER — Emergency Department (HOSPITAL_COMMUNITY): Payer: Medicare Other

## 2018-12-20 ENCOUNTER — Other Ambulatory Visit: Payer: Self-pay

## 2018-12-20 ENCOUNTER — Emergency Department (HOSPITAL_COMMUNITY)
Admission: EM | Admit: 2018-12-20 | Discharge: 2018-12-20 | Disposition: A | Discharge status: Home / Self Care | Payer: Medicare Other | Attending: Emergency Medicine | Admitting: Emergency Medicine

## 2018-12-20 ENCOUNTER — Encounter (HOSPITAL_COMMUNITY): Payer: Self-pay | Admitting: Emergency Medicine

## 2018-12-20 DIAGNOSIS — Z794 Long term (current) use of insulin: Secondary | ICD-10-CM | POA: Diagnosis not present

## 2018-12-20 DIAGNOSIS — W050XXA Fall from non-moving wheelchair, initial encounter: Secondary | ICD-10-CM | POA: Diagnosis not present

## 2018-12-20 DIAGNOSIS — Z7401 Bed confinement status: Secondary | ICD-10-CM | POA: Diagnosis not present

## 2018-12-20 DIAGNOSIS — S199XXA Unspecified injury of neck, initial encounter: Secondary | ICD-10-CM | POA: Diagnosis not present

## 2018-12-20 DIAGNOSIS — F0390 Unspecified dementia without behavioral disturbance: Secondary | ICD-10-CM | POA: Insufficient documentation

## 2018-12-20 DIAGNOSIS — I10 Essential (primary) hypertension: Secondary | ICD-10-CM | POA: Diagnosis not present

## 2018-12-20 DIAGNOSIS — R456 Violent behavior: Secondary | ICD-10-CM | POA: Diagnosis not present

## 2018-12-20 DIAGNOSIS — Y92129 Unspecified place in nursing home as the place of occurrence of the external cause: Secondary | ICD-10-CM | POA: Diagnosis not present

## 2018-12-20 DIAGNOSIS — Z8673 Personal history of transient ischemic attack (TIA), and cerebral infarction without residual deficits: Secondary | ICD-10-CM | POA: Insufficient documentation

## 2018-12-20 DIAGNOSIS — E161 Other hypoglycemia: Secondary | ICD-10-CM | POA: Diagnosis not present

## 2018-12-20 DIAGNOSIS — I129 Hypertensive chronic kidney disease with stage 1 through stage 4 chronic kidney disease, or unspecified chronic kidney disease: Secondary | ICD-10-CM | POA: Insufficient documentation

## 2018-12-20 DIAGNOSIS — Z96641 Presence of right artificial hip joint: Secondary | ICD-10-CM | POA: Diagnosis not present

## 2018-12-20 DIAGNOSIS — Y998 Other external cause status: Secondary | ICD-10-CM | POA: Diagnosis not present

## 2018-12-20 DIAGNOSIS — S0990XA Unspecified injury of head, initial encounter: Secondary | ICD-10-CM | POA: Diagnosis not present

## 2018-12-20 DIAGNOSIS — M2550 Pain in unspecified joint: Secondary | ICD-10-CM | POA: Diagnosis not present

## 2018-12-20 DIAGNOSIS — E162 Hypoglycemia, unspecified: Secondary | ICD-10-CM | POA: Diagnosis not present

## 2018-12-20 DIAGNOSIS — Z79899 Other long term (current) drug therapy: Secondary | ICD-10-CM | POA: Diagnosis not present

## 2018-12-20 DIAGNOSIS — E11649 Type 2 diabetes mellitus with hypoglycemia without coma: Secondary | ICD-10-CM | POA: Diagnosis not present

## 2018-12-20 DIAGNOSIS — Y939 Activity, unspecified: Secondary | ICD-10-CM | POA: Diagnosis not present

## 2018-12-20 DIAGNOSIS — G459 Transient cerebral ischemic attack, unspecified: Secondary | ICD-10-CM | POA: Diagnosis not present

## 2018-12-20 DIAGNOSIS — Z7982 Long term (current) use of aspirin: Secondary | ICD-10-CM | POA: Diagnosis not present

## 2018-12-20 DIAGNOSIS — Z87891 Personal history of nicotine dependence: Secondary | ICD-10-CM | POA: Diagnosis not present

## 2018-12-20 DIAGNOSIS — E1122 Type 2 diabetes mellitus with diabetic chronic kidney disease: Secondary | ICD-10-CM | POA: Diagnosis not present

## 2018-12-20 DIAGNOSIS — N189 Chronic kidney disease, unspecified: Secondary | ICD-10-CM | POA: Diagnosis not present

## 2018-12-20 DIAGNOSIS — R9431 Abnormal electrocardiogram [ECG] [EKG]: Secondary | ICD-10-CM | POA: Diagnosis not present

## 2018-12-20 DIAGNOSIS — R404 Transient alteration of awareness: Secondary | ICD-10-CM | POA: Diagnosis not present

## 2018-12-20 LAB — CBC WITH DIFFERENTIAL/PLATELET
Abs Immature Granulocytes: 0.02 10*3/uL (ref 0.00–0.07)
Basophils Absolute: 0 10*3/uL (ref 0.0–0.1)
Basophils Relative: 0 %
Eosinophils Absolute: 0.3 10*3/uL (ref 0.0–0.5)
Eosinophils Relative: 4 %
HCT: 39.6 % (ref 39.0–52.0)
Hemoglobin: 12.6 g/dL — ABNORMAL LOW (ref 13.0–17.0)
Immature Granulocytes: 0 %
Lymphocytes Relative: 21 %
Lymphs Abs: 1.9 10*3/uL (ref 0.7–4.0)
MCH: 30.4 pg (ref 26.0–34.0)
MCHC: 31.8 g/dL (ref 30.0–36.0)
MCV: 95.4 fL (ref 80.0–100.0)
Monocytes Absolute: 1.1 10*3/uL — ABNORMAL HIGH (ref 0.1–1.0)
Monocytes Relative: 12 %
Neutro Abs: 5.8 10*3/uL (ref 1.7–7.7)
Neutrophils Relative %: 63 %
Platelets: 190 10*3/uL (ref 150–400)
RBC: 4.15 MIL/uL — ABNORMAL LOW (ref 4.22–5.81)
RDW: 12.6 % (ref 11.5–15.5)
WBC: 9.1 10*3/uL (ref 4.0–10.5)
nRBC: 0 % (ref 0.0–0.2)

## 2018-12-20 LAB — BASIC METABOLIC PANEL
Anion gap: 12 (ref 5–15)
BUN: 16 mg/dL (ref 8–23)
CO2: 29 mmol/L (ref 22–32)
Calcium: 9.1 mg/dL (ref 8.9–10.3)
Chloride: 105 mmol/L (ref 98–111)
Creatinine, Ser: 0.9 mg/dL (ref 0.61–1.24)
GFR calc Af Amer: >60 mL/min (ref >60)
GFR calc non Af Amer: >60 mL/min (ref >60)
Glucose, Bld: 58 mg/dL — ABNORMAL LOW (ref 70–99)
Potassium: 3.7 mmol/L (ref 3.5–5.1)
Sodium: 146 mmol/L — ABNORMAL HIGH (ref 135–145)

## 2018-12-20 LAB — CBG MONITORING, ED
Glucose-Capillary: 32 mg/dL — CL (ref 70–99)
Glucose-Capillary: 49 mg/dL — ABNORMAL LOW (ref 70–99)
Glucose-Capillary: 159 mg/dL — ABNORMAL HIGH (ref 70–99)
Glucose-Capillary: 98 mg/dL (ref 70–99)

## 2018-12-20 MED ORDER — DEXTROSE 50 % IV SOLN
1.0000 | Freq: Once | INTRAVENOUS | Status: AC
  Administered 2018-12-20: 50 mL via INTRAVENOUS
  Filled 2018-12-20: qty 50

## 2018-12-20 MED ORDER — GLUCAGON HCL RDNA (DIAGNOSTIC) 1 MG IJ SOLR
1.0000 mg | Freq: Once | INTRAMUSCULAR | Status: AC
  Administered 2018-12-20: 1 mg via INTRAMUSCULAR
  Filled 2018-12-20: qty 1

## 2018-12-20 MED ORDER — STERILE WATER FOR INJECTION IJ SOLN
INTRAMUSCULAR | Status: AC
  Filled 2018-12-20: qty 10

## 2018-12-20 MED ORDER — LORAZEPAM 2 MG/ML IJ SOLN
1.0000 mg | Freq: Once | INTRAMUSCULAR | Status: AC
  Administered 2018-12-20: 1 mg via INTRAVENOUS
  Filled 2018-12-20: qty 1

## 2018-12-20 NOTE — ED Notes (Signed)
Patient transported to CT 

## 2018-12-20 NOTE — ED Triage Notes (Signed)
Per [facility] pt coming from [facility] after a witnessed fall. Patient fell from a wheelchair face first onto hardwood floor. Hematoma to front of head.

## 2018-12-21 ENCOUNTER — Other Ambulatory Visit: Payer: Self-pay

## 2018-12-21 ENCOUNTER — Emergency Department (HOSPITAL_COMMUNITY): Payer: Medicare Other

## 2018-12-21 ENCOUNTER — Emergency Department (HOSPITAL_COMMUNITY)
Admission: EM | Admit: 2018-12-21 | Discharge: 2018-12-21 | Disposition: A | Discharge status: Home / Self Care | Payer: Medicare Other | Attending: Emergency Medicine | Admitting: Emergency Medicine

## 2018-12-21 DIAGNOSIS — Z79899 Other long term (current) drug therapy: Secondary | ICD-10-CM | POA: Insufficient documentation

## 2018-12-21 DIAGNOSIS — Z8673 Personal history of transient ischemic attack (TIA), and cerebral infarction without residual deficits: Secondary | ICD-10-CM | POA: Insufficient documentation

## 2018-12-21 DIAGNOSIS — F0390 Unspecified dementia without behavioral disturbance: Secondary | ICD-10-CM | POA: Insufficient documentation

## 2018-12-21 DIAGNOSIS — R52 Pain, unspecified: Secondary | ICD-10-CM | POA: Diagnosis not present

## 2018-12-21 DIAGNOSIS — Z96641 Presence of right artificial hip joint: Secondary | ICD-10-CM | POA: Diagnosis not present

## 2018-12-21 DIAGNOSIS — R5381 Other malaise: Secondary | ICD-10-CM | POA: Diagnosis not present

## 2018-12-21 DIAGNOSIS — W050XXA Fall from non-moving wheelchair, initial encounter: Secondary | ICD-10-CM | POA: Diagnosis not present

## 2018-12-21 DIAGNOSIS — I129 Hypertensive chronic kidney disease with stage 1 through stage 4 chronic kidney disease, or unspecified chronic kidney disease: Secondary | ICD-10-CM | POA: Insufficient documentation

## 2018-12-21 DIAGNOSIS — N189 Chronic kidney disease, unspecified: Secondary | ICD-10-CM | POA: Insufficient documentation

## 2018-12-21 DIAGNOSIS — W19XXXA Unspecified fall, initial encounter: Secondary | ICD-10-CM | POA: Diagnosis not present

## 2018-12-21 DIAGNOSIS — I10 Essential (primary) hypertension: Secondary | ICD-10-CM | POA: Diagnosis not present

## 2018-12-21 DIAGNOSIS — Z794 Long term (current) use of insulin: Secondary | ICD-10-CM | POA: Diagnosis not present

## 2018-12-21 DIAGNOSIS — Y998 Other external cause status: Secondary | ICD-10-CM | POA: Diagnosis not present

## 2018-12-21 DIAGNOSIS — Z87891 Personal history of nicotine dependence: Secondary | ICD-10-CM | POA: Diagnosis not present

## 2018-12-21 DIAGNOSIS — Y92129 Unspecified place in nursing home as the place of occurrence of the external cause: Secondary | ICD-10-CM | POA: Diagnosis not present

## 2018-12-21 DIAGNOSIS — Z9049 Acquired absence of other specified parts of digestive tract: Secondary | ICD-10-CM | POA: Insufficient documentation

## 2018-12-21 DIAGNOSIS — I2510 Atherosclerotic heart disease of native coronary artery without angina pectoris: Secondary | ICD-10-CM | POA: Insufficient documentation

## 2018-12-21 DIAGNOSIS — S0990XA Unspecified injury of head, initial encounter: Secondary | ICD-10-CM | POA: Diagnosis not present

## 2018-12-21 DIAGNOSIS — R531 Weakness: Secondary | ICD-10-CM | POA: Diagnosis not present

## 2018-12-21 DIAGNOSIS — Y9389 Activity, other specified: Secondary | ICD-10-CM | POA: Insufficient documentation

## 2018-12-21 DIAGNOSIS — S0081XA Abrasion of other part of head, initial encounter: Secondary | ICD-10-CM | POA: Diagnosis not present

## 2018-12-21 DIAGNOSIS — R404 Transient alteration of awareness: Secondary | ICD-10-CM | POA: Diagnosis not present

## 2018-12-21 DIAGNOSIS — S199XXA Unspecified injury of neck, initial encounter: Secondary | ICD-10-CM | POA: Diagnosis not present

## 2018-12-21 DIAGNOSIS — E1122 Type 2 diabetes mellitus with diabetic chronic kidney disease: Secondary | ICD-10-CM | POA: Insufficient documentation

## 2018-12-21 LAB — URINALYSIS, ROUTINE W REFLEX MICROSCOPIC
BILIRUBIN URINE: NEGATIVE
Glucose, UA: NEGATIVE mg/dL
Hgb urine dipstick: NEGATIVE
Ketones, ur: NEGATIVE mg/dL
Leukocytes, UA: NEGATIVE
Nitrite: NEGATIVE
Protein, ur: NEGATIVE mg/dL
Specific Gravity, Urine: 1.016 (ref 1.005–1.030)
pH: 7 (ref 5.0–8.0)

## 2018-12-21 NOTE — ED Triage Notes (Signed)
Per EMS, pt from [facility], fell from wheelchair, hit forehead on floor. Pt has hematoma to forehead. Pt denies other injury. Fall was witness, pt is not on blood thinners. Pt has hx of dementia, is at baseline, not on blood thinners. Pt is nonambulatory.   BP 144/88 HR 80 CBG 190

## 2018-12-21 NOTE — ED Notes (Signed)
PT DISCHARGED. INSTRUCTIONS GIVEN TO [facility] STAFF. AAOX2. PT IN NO APPARENT DISTRESS OR PAIN. THE OPPORTUNITY TO ASK QUESTIONS WAS PROVIDED. 

## 2018-12-21 NOTE — Discharge Instructions (Addendum)
You have been evaluated for your fall.  No injury were noted on your CT scan.  Your urine did not show any evidence of infection.  Please follow up with your doctor for further care.

## 2018-12-21 NOTE — ED Provider Notes (Signed)
[facility]-EMERGENCY DEPT Provider Note   CSN: [id number] Arrival date & time: 12/21/18  1758     History   Chief Complaint Chief Complaint  Patient presents with  . Fall  . Head Injury    HPI Arthur Berry is a 73 y.o. male.  The history is provided by the patient, medical records and the EMS personnel. No language interpreter was used.  Fall   Head Injury     73 year old male with history of advanced dementia, diabetes, prior stroke, seizures, hypertension brought here via EMS from nursing home for evaluation of a recent fall.  Per note, patient had a witnessed fall when he fell forward from his wheelchair and struck his forehead against the floor.  No loss of consciousness.  Patient is not on any blood thinner medication.  Patient is usually nonambulatory.  He is at baseline.  He does not have any specific complaint.  He has been seen recently for unwitnessed fall with an unremarkable work-up.  Currently patient report mild headache but no neck pain.  Denies any chest pain trouble breathing abdominal pain or pain to his extremities.  Past Medical History:  Diagnosis Date  . Adjustment reaction with aggression 05/14/2017  . Arthritis   . Chronic kidney disease   . Colon polyp 2005   Tubular Adenoma  . Coronary artery disease, non-occlusive    Minimal nonobstructive CAD, nl LV systolic fxn by cath 12/11/12  . Diabetes mellitus 2001   TYPE 2  . Diverticulosis   . Epilepsy (HCC)   . GERD (gastroesophageal reflux disease) 04/23/2014  . Goiter, nontoxic, multinodular    Thyroid US 11/2012  . Hyperlipidemia 1999  . Hypertension 1999  . Internal hemorrhoids   . Neuromuscular disorder (HCC)   . OSA (obstructive sleep apnea)   . Prostate enlargement   . Sleep apnea   . Splinter 04/27/13   metal plinter removed rt pointer finger  . Stroke (HCC) 06/2015  . Vision changes 06/2015   since CVA    Patient Active Problem List   Diagnosis Date Noted  . Acute  metabolic encephalopathy 11/05/2018  . Hypernatremia 11/01/2018  . Status post total replacement of right hip 11/12/2017  . Closed right hip fracture, initial encounter (HCC) 10/23/2017  . Closed subcapital fracture of right femur (HCC)   . Adjustment reaction with aggression 05/14/2017  . Type 2 diabetes mellitus with other circulatory complications (HCC) 06/19/2015  . Thrombocytopenia (HCC) 06/19/2015  . Cerebral infarction due to unspecified mechanism   . Dilantin toxicity 06/18/2015  . Phenytoin toxicity   . Stroke (HCC) 06/16/2015  . Fall 06/16/2015  . Epilepsy (HCC) 06/16/2015  . BPH (benign prostatic hyperplasia) 06/16/2015  . Leukocytosis 06/16/2015  . CVA (cerebral infarction) 06/16/2015  . CVA (cerebral vascular accident) (HCC) 06/15/2015  . GERD (gastroesophageal reflux disease) 04/23/2014  . Potassium (K) deficiency 12/11/2012  . Precordial pain 12/11/2012  . Dehydration 04/15/2012  . AKI (acute kidney injury) (HCC) 04/14/2012  . HLD (hyperlipidemia) 06/01/2010  . Essential hypertension, benign 06/01/2010  . DYSPNEA 06/01/2010  . Diabetes mellitus without complication (HCC) 11/04/2009  . RECTAL BLEEDING 11/04/2009  . PERSONAL HX COLONIC POLYPS 11/04/2009    Past Surgical History:  Procedure Laterality Date  . CHOLECYSTECTOMY    . COLONOSCOPY  12/28/2009  . ESOPHAGOGASTRODUODENOSCOPY  10/14/2012   normal   . INGUINAL HERNIA REPAIR  06/27/11   left  . KNEE SURGERY  2008   Left  . LEFT HEART CATHETERIZATION  WITH CORONARY ANGIOGRAM N/A 12/11/2012   Procedure: LEFT HEART CATHETERIZATION WITH CORONARY ANGIOGRAM;  Surgeon: Tonny BollmanMichael Cooper, MD;  Location: [facility] CATH LAB;  Service: Cardiovascular;  Laterality: N/A;  . TOTAL HIP ARTHROPLASTY Right 10/23/2017   Procedure: TOTAL HIP ARTHROPLASTY ANTERIOR APPROACH;  Surgeon: Kathryne HitchBlackman, Christopher Y, MD;  Location: [facility] OR;  Service: Orthopedics;  Laterality: Right;        Home Medications    Prior to Admission medications     Medication Sig Start Date End Date Taking? Authorizing Provider  amLODipine (NORVASC) 2.5 MG tablet Take 2.5 mg by mouth daily.    [provider]  carvedilol (COREG) 3.125 MG tablet Take 3.125 mg by mouth 2 (two) times daily with a meal.    [provider]  Cholecalciferol (VITAMIN D3 PO) Take 1 tablet by mouth daily.    [provider]  escitalopram (LEXAPRO) 10 MG tablet Take 10 mg by mouth daily.    [provider]  galantamine (RAZADYNE ER) 16 MG 24 hr capsule Take 16 mg by mouth daily with breakfast.    [provider]  insulin aspart (NOVOLOG) 100 UNIT/ML injection Inject 0-15 Units into the skin 4 (four) times daily -  before meals and at bedtime. Mealtime sliding scale: Blood Glucose 121 - 150: 1 units  BG 151 - 200: 2 units  BG 201 - 250: 3 units  BG 251 - 300: 5 units  BG 301 - 350: 7 units  BG 351 - 400: 9 units   Bedtime sliding scale: BG 70 - 200: 0 units  BG 201 - 250: 2 units  BG 251 - 300: 3 units  BG 301 - 350: 4 units  BG 351 - 400: 5 units 11/07/18   Noralee Stainhoi, Jennifer, DO  insulin glargine (LANTUS) 100 UNIT/ML injection Inject 0.15 mLs (15 Units total) into the skin at bedtime. 11/07/18 12/07/18  Noralee Stainhoi, Jennifer, DO  levETIRAcetam (KEPPRA) 500 MG tablet Take 500 mg by mouth 2 (two) times daily.      [provider]  lisinopril (PRINIVIL,ZESTRIL) 20 MG tablet Take 20 mg by mouth daily.    [provider]  LORazepam (ATIVAN) 0.5 MG tablet Take 0.5 tablets (0.25 mg total) by mouth at bedtime. 11/07/18   Noralee Stainhoi, Jennifer, DO  magnesium hydroxide (MILK OF MAGNESIA) 400 MG/5ML suspension Take 30 mLs by mouth at bedtime as needed for mild constipation.     [provider]  magnesium oxide (MAG-OX) 400 MG tablet Take 400 mg by mouth daily.    [provider]  Melatonin 3 MG TABS Take 3 mg by mouth at bedtime.    [provider]  metFORMIN (GLUCOPHAGE) 500 MG tablet Take 500 mg by mouth 2 (two)  times daily.     [provider]  mirtazapine (REMERON) 30 MG tablet Take 30 mg by mouth at bedtime.     [provider]  neomycin-bacitracin-polymyxin (NEOSPORIN) ointment Apply 1 application topically daily as needed for wound care.    [provider]  pantoprazole (PROTONIX) 40 MG tablet Take 1 tablet (40 mg total) by mouth daily. 06/22/15   Catarina Hartshornat, David, MD  phenytoin (DILANTIN) 100 MG ER capsule Take 3 capsules (300 mg total) by mouth daily. 11/07/18   Noralee Stainhoi, Jennifer, DO  risperiDONE (RISPERDAL) 2 MG tablet Take 2 mg by mouth at bedtime.     [provider]    Family History Family History  Problem Relation Age of Onset  . Heart disease Mother   .  Alzheimer's disease Father   . Breast cancer Sister   . Colon cancer Other        unknown  . Diabetes Sister     Social History Social History   Tobacco Use  . Smoking status: Former Smoker    Types: Cigars    Last attempt to quit: 04/14/1986    Years since quitting: 32.7  . Smokeless tobacco: Never Used  . Tobacco comment: Quit cigars in the 1980s  Substance Use Topics  . Alcohol use: No  . Drug use: No     Allergies   Patient has no known allergies.   Review of Systems Review of Systems  Unable to perform ROS: Dementia     Physical Exam Updated Vital Signs BP (!) 147/90 (BP Location: Right Arm)   Pulse 85   Temp 97.7 F (36.5 C) (Oral)   Resp 16   SpO2 100%   Physical Exam Vitals signs and nursing note reviewed.  Constitutional:      General: He is not in acute distress.    Appearance: He is well-developed.  HENT:     Head: Normocephalic.     Comments: Small abrasion noted to forehead minimal tenderness to palpation.  No hemotympanum, no septal hematoma, no malocclusion, no midface tenderness and no scalp tenderness. Eyes:     Conjunctiva/sclera: Conjunctivae normal.  Neck:     Musculoskeletal: Normal range of motion and neck supple.     Comments: Hard collar in place, no  cervical midline spine tenderness Cardiovascular:     Rate and Rhythm: Normal rate and regular rhythm.     Pulses: Normal pulses.     Heart sounds: Normal heart sounds.  Pulmonary:     Effort: Pulmonary effort is normal.     Breath sounds: Normal breath sounds.  Abdominal:     Palpations: Abdomen is soft.     Tenderness: There is no abdominal tenderness.  Musculoskeletal:     Comments: Able to move all 4 extremities with equal strength.  Skin:    Findings: No rash.  Neurological:     Mental Status: He is alert.     Comments: Alert to self, and time but not to place and situation.      ED Treatments / Results  Labs (all labs ordered are listed, but only abnormal results are displayed) Labs Reviewed  URINALYSIS, ROUTINE W REFLEX MICROSCOPIC    EKG EKG Interpretation  Date/Time:  Saturday December 21 2018 19:18:25 EST Ventricular Rate:  80 PR Interval:  124 QRS Duration: 86 QT Interval:  370 QTC Calculation: 426 R Axis:   48 Text Interpretation:  Normal sinus rhythm Cannot rule out Anterior infarct , age undetermined Abnormal ECG Interpretation limited secondary to artifact Confirmed by Vanetta Mulders [phone]) on 12/21/2018 7:24:50 PM   Radiology Ct Head Wo Contrast  Result Date: 12/21/2018 CLINICAL DATA:  Head injury after fall. EXAM: CT HEAD WITHOUT CONTRAST CT CERVICAL SPINE WITHOUT CONTRAST TECHNIQUE: Multidetector CT imaging of the head and cervical spine was performed following the standard protocol without intravenous contrast. Multiplanar CT image reconstructions of the cervical spine were also generated. COMPARISON:  CT scan of December 20, 2018. FINDINGS: CT HEAD FINDINGS Brain: Stable encephalomalacia is seen involving the left frontal and parietal cortex. Stable severe dilatation of both lateral ventricles is noted. No hemorrhage or acute infarction is noted. No mass lesion is noted. Vascular: No hyperdense vessel or unexpected calcification. Skull: Normal. Negative  for fracture or focal lesion. Sinuses/Orbits: No  acute finding. Other: None. CT CERVICAL SPINE FINDINGS Alignment: Minimal grade 1 anterolisthesis of C4-5 is noted secondary to posterior facet joint hypertrophy. Skull base and vertebrae: No acute fracture. No primary bone lesion or focal pathologic process. Soft tissues and spinal canal: No prevertebral fluid or swelling. No visible canal hematoma. Disc levels: Moderate degenerative disc disease is noted at C3-4 and C4-5. Severe degenerative disc disease is noted at C5-6 and C6-7. Upper chest: Negative. Other: Degenerative changes are seen involving posterior facet joints bilaterally. IMPRESSION: Stable encephalomalacia is seen involving the left frontal and parietal cortex. Stable severe dilatation of both lateral ventricles is noted compared to prior exam. No significant changes noted compared to prior exam. Multilevel degenerative disc disease. No acute abnormality seen in the cervical spine. Electronically Signed   By: Lupita Raider, M.D.   On: 12/21/2018 19:31   Ct Head Wo Contrast  Result Date: 12/20/2018 CLINICAL DATA:  Fall EXAM: CT HEAD WITHOUT CONTRAST CT CERVICAL SPINE WITHOUT CONTRAST TECHNIQUE: Multidetector CT imaging of the head and cervical spine was performed following the standard protocol without intravenous contrast. Multiplanar CT image reconstructions of the cervical spine were also generated. COMPARISON:  12/11/2018 FINDINGS: CT HEAD FINDINGS Brain: No evidence of acute infarction, hemorrhage, extra-axial collection or mass lesion/mass effect. No change in gross dilatation of the lateral ventricles and cystic encephalomalacia of the anterior left hemisphere. Vascular: No hyperdense vessel or unexpected calcification. Skull: Normal. Negative for fracture or focal lesion. Sinuses/Orbits: No acute finding. Other: None. CT CERVICAL SPINE FINDINGS Alignment: Normal. Skull base and vertebrae: No acute fracture. No primary bone lesion or focal  pathologic process. Soft tissues and spinal canal: No prevertebral fluid or swelling. No visible canal hematoma. Disc levels: Moderate to severe multilevel disc degenerative disease and osteophytosis. Upper chest: Negative. Other: None. IMPRESSION: 1.  No acute intracranial pathology. 2. No change in gross dilatation of the lateral ventricles and cystic encephalomalacia of the anterior left hemisphere. 3.  No fracture or static subluxation of the cervical spine. 4.  Multilevel disc degenerative disease of the cervical spine. Electronically Signed   By: Lauralyn Primes M.D.   On: 12/20/2018 11:35   Ct Cervical Spine Wo Contrast  Result Date: 12/21/2018 CLINICAL DATA:  Head injury after fall. EXAM: CT HEAD WITHOUT CONTRAST CT CERVICAL SPINE WITHOUT CONTRAST TECHNIQUE: Multidetector CT imaging of the head and cervical spine was performed following the standard protocol without intravenous contrast. Multiplanar CT image reconstructions of the cervical spine were also generated. COMPARISON:  CT scan of December 20, 2018. FINDINGS: CT HEAD FINDINGS Brain: Stable encephalomalacia is seen involving the left frontal and parietal cortex. Stable severe dilatation of both lateral ventricles is noted. No hemorrhage or acute infarction is noted. No mass lesion is noted. Vascular: No hyperdense vessel or unexpected calcification. Skull: Normal. Negative for fracture or focal lesion. Sinuses/Orbits: No acute finding. Other: None. CT CERVICAL SPINE FINDINGS Alignment: Minimal grade 1 anterolisthesis of C4-5 is noted secondary to posterior facet joint hypertrophy. Skull base and vertebrae: No acute fracture. No primary bone lesion or focal pathologic process. Soft tissues and spinal canal: No prevertebral fluid or swelling. No visible canal hematoma. Disc levels: Moderate degenerative disc disease is noted at C3-4 and C4-5. Severe degenerative disc disease is noted at C5-6 and C6-7. Upper chest: Negative. Other: Degenerative changes  are seen involving posterior facet joints bilaterally. IMPRESSION: Stable encephalomalacia is seen involving the left frontal and parietal cortex. Stable severe dilatation of both lateral ventricles is noted  compared to prior exam. No significant changes noted compared to prior exam. Multilevel degenerative disc disease. No acute abnormality seen in the cervical spine. Electronically Signed   By: Lupita Raider, M.D.   On: 12/21/2018 19:31   Ct Cervical Spine Wo Contrast  Result Date: 12/20/2018 CLINICAL DATA:  Fall EXAM: CT HEAD WITHOUT CONTRAST CT CERVICAL SPINE WITHOUT CONTRAST TECHNIQUE: Multidetector CT imaging of the head and cervical spine was performed following the standard protocol without intravenous contrast. Multiplanar CT image reconstructions of the cervical spine were also generated. COMPARISON:  12/11/2018 FINDINGS: CT HEAD FINDINGS Brain: No evidence of acute infarction, hemorrhage, extra-axial collection or mass lesion/mass effect. No change in gross dilatation of the lateral ventricles and cystic encephalomalacia of the anterior left hemisphere. Vascular: No hyperdense vessel or unexpected calcification. Skull: Normal. Negative for fracture or focal lesion. Sinuses/Orbits: No acute finding. Other: None. CT CERVICAL SPINE FINDINGS Alignment: Normal. Skull base and vertebrae: No acute fracture. No primary bone lesion or focal pathologic process. Soft tissues and spinal canal: No prevertebral fluid or swelling. No visible canal hematoma. Disc levels: Moderate to severe multilevel disc degenerative disease and osteophytosis. Upper chest: Negative. Other: None. IMPRESSION: 1.  No acute intracranial pathology. 2. No change in gross dilatation of the lateral ventricles and cystic encephalomalacia of the anterior left hemisphere. 3.  No fracture or static subluxation of the cervical spine. 4.  Multilevel disc degenerative disease of the cervical spine. Electronically Signed   By: Lauralyn Primes M.D.    On: 12/20/2018 11:35    Procedures Procedures (including critical care time)  Medications Ordered in ED Medications - No data to display   Initial Impression / Assessment and Plan / ED Course  I have reviewed the triage vital signs and the nursing notes.  Pertinent labs & imaging results that were available during my care of the patient were reviewed by me and considered in my medical decision making (see chart for details).     BP (!) 146/96   Pulse 76   Temp 97.7 F (36.5 C) (Oral)   Resp (!) 25   Ht 5\' 9"  (1.753 m)   Wt 81 kg   SpO2 100%   BMI 26.37 kg/m    Final Clinical Impressions(s) / ED Diagnoses   Final diagnoses:  Fall at nursing home, initial encounter    ED Discharge Orders    None     6:34 PM Patient with history of dementia here with a witnessed fall when he fell forward and struck his head against the ground without any loss of consciousness.  He does not have any significant signs of injury aside from a mild abrasion to forehead.  No other signs of injury noted.  Given age, will obtain head and cervical spine CT  8:46 PM Head and cervical spine CT without acute changes, urine shows no signs of infection.  EKG without concerning arrhythmia or ischemic changes.  Patient resting comfortably.  Hard collar removed, he is stable to return to his facility for further care.  Care discussed with DR. Deretha Emory.    Fayrene Helper, PA-C 12/21/18 2047    Vanetta Mulders, MD 01/01/19 2202

## 2018-12-21 NOTE — ED Notes (Signed)
Bed: WA01 Expected date:  Expected time:  Means of arrival:  Comments: 73 yo fall; head injury

## 2018-12-23 DIAGNOSIS — E11649 Type 2 diabetes mellitus with hypoglycemia without coma: Secondary | ICD-10-CM | POA: Diagnosis not present

## 2018-12-23 DIAGNOSIS — G300 Alzheimer's disease with early onset: Secondary | ICD-10-CM | POA: Diagnosis not present

## 2018-12-23 DIAGNOSIS — E162 Hypoglycemia, unspecified: Secondary | ICD-10-CM | POA: Diagnosis not present

## 2018-12-23 DIAGNOSIS — R296 Repeated falls: Secondary | ICD-10-CM | POA: Diagnosis not present

## 2018-12-23 DIAGNOSIS — R569 Unspecified convulsions: Secondary | ICD-10-CM | POA: Diagnosis not present

## 2018-12-23 DIAGNOSIS — I6930 Unspecified sequelae of cerebral infarction: Secondary | ICD-10-CM | POA: Diagnosis not present

## 2018-12-23 DIAGNOSIS — G4700 Insomnia, unspecified: Secondary | ICD-10-CM | POA: Diagnosis not present

## 2018-12-23 DIAGNOSIS — I10 Essential (primary) hypertension: Secondary | ICD-10-CM | POA: Diagnosis not present

## 2018-12-23 DIAGNOSIS — Z760 Encounter for issue of repeat prescription: Secondary | ICD-10-CM | POA: Diagnosis not present

## 2018-12-23 DIAGNOSIS — S7291XA Unspecified fracture of right femur, initial encounter for closed fracture: Secondary | ICD-10-CM | POA: Diagnosis not present

## 2018-12-26 NOTE — ED Provider Notes (Signed)
[facility] Provider Note   CSN: [id number] Arrival date & time: 12/20/18  [id number]     History   Chief Complaint Chief Complaint  Patient presents with  . Fall  . Head Injury    HPI Arthur Berry is a 73 y.o. male.  HPI   73 year old male presenting for evaluation after fall.  Is come from nursing facility.  He fell from a wheelchair face first.  He fell onto a hardwood floor.  No reported loss of consciousness.  No hematoma noted noted to his forehead.  Reportedly at his baseline.  He is not on any blood thinners.  He has a history of dementia and is reportedly at his baseline.  He is unable to add additional history.  Hypoglycemic.  Past Medical History:  Diagnosis Date  . Adjustment reaction with aggression 05/14/2017  . Arthritis   . Chronic kidney disease   . Colon polyp 2005   Tubular Adenoma  . Coronary artery disease, non-occlusive    Minimal nonobstructive CAD, nl LV systolic fxn by cath 12/11/12  . Diabetes mellitus 2001   TYPE 2  . Diverticulosis   . Epilepsy (HCC)   . GERD (gastroesophageal reflux disease) 04/23/2014  . Goiter, nontoxic, multinodular    Thyroid US 11/2012  . Hyperlipidemia 1999  . Hypertension 1999  . Internal hemorrhoids   . Neuromuscular disorder (HCC)   . OSA (obstructive sleep apnea)   . Prostate enlargement   . Sleep apnea   . Splinter 04/27/13   metal plinter removed rt pointer finger  . Stroke (HCC) 06/2015  . Vision changes 06/2015   since CVA    Patient Active Problem List   Diagnosis Date Noted  . Acute metabolic encephalopathy 11/05/2018  . Hypernatremia 11/01/2018  . Status post total replacement of right hip 11/12/2017  . Closed right hip fracture, initial encounter (HCC) 10/23/2017  . Closed subcapital fracture of right femur (HCC)   . Adjustment reaction with aggression 05/14/2017  . Type 2 diabetes mellitus with other circulatory complications (HCC) 06/19/2015  . Thrombocytopenia  (HCC) 06/19/2015  . Cerebral infarction due to unspecified mechanism   . Dilantin toxicity 06/18/2015  . Phenytoin toxicity   . Stroke (HCC) 06/16/2015  . Fall 06/16/2015  . Epilepsy (HCC) 06/16/2015  . BPH (benign prostatic hyperplasia) 06/16/2015  . Leukocytosis 06/16/2015  . CVA (cerebral infarction) 06/16/2015  . CVA (cerebral vascular accident) (HCC) 06/15/2015  . GERD (gastroesophageal reflux disease) 04/23/2014  . Potassium (K) deficiency 12/11/2012  . Precordial pain 12/11/2012  . Dehydration 04/15/2012  . AKI (acute kidney injury) (HCC) 04/14/2012  . HLD (hyperlipidemia) 06/01/2010  . Essential hypertension, benign 06/01/2010  . DYSPNEA 06/01/2010  . Diabetes mellitus without complication (HCC) 11/04/2009  . RECTAL BLEEDING 11/04/2009  . PERSONAL HX COLONIC POLYPS 11/04/2009    Past Surgical History:  Procedure Laterality Date  . CHOLECYSTECTOMY    . COLONOSCOPY  12/28/2009  . ESOPHAGOGASTRODUODENOSCOPY  10/14/2012   normal   . INGUINAL HERNIA REPAIR  06/27/11   left  . KNEE SURGERY  2008   Left  . LEFT HEART CATHETERIZATION WITH CORONARY ANGIOGRAM N/A 12/11/2012   Procedure: LEFT HEART CATHETERIZATION WITH CORONARY ANGIOGRAM;  Surgeon: Tonny BollmanMichael Cooper, MD;  [facility];  Service: Cardiovascular;  Laterality: N/A;  . TOTAL HIP ARTHROPLASTY Right 10/23/2017   Procedure: TOTAL HIP ARTHROPLASTY ANTERIOR APPROACH;  Surgeon: Kathryne HitchBlackman, Christopher Y, MD;  Location: [facility] OR;  Service: Orthopedics;  Laterality: Right;  Home Medications    Prior to Admission medications   Medication Sig Start Date End Date Taking? Authorizing Provider  acetaminophen (TYLENOL) 500 MG tablet Take 500 mg by mouth every 4 (four) hours as needed for mild pain.    [provider]  alum & mag hydroxide-simeth (MAALOX/MYLANTA) 200-200-20 MG/5ML suspension Take 30 mLs by mouth every 6 (six) hours as needed for indigestion or heartburn.    [provider]  amLODipine  (NORVASC) 2.5 MG tablet Take 2.5 mg by mouth daily.    [provider]  aspirin EC 81 MG tablet Take 81 mg by mouth daily.    [provider]  carvedilol (COREG) 3.125 MG tablet Take 3.125 mg by mouth 2 (two) times daily with a meal.    [provider]  Cholecalciferol (VITAMIN D3 PO) Take 2,000 Units by mouth at bedtime.     [provider]  escitalopram (LEXAPRO) 10 MG tablet Take 10 mg by mouth daily.    [provider]  galantamine (RAZADYNE ER) 16 MG 24 hr capsule Take 16 mg by mouth daily with breakfast.    [provider]  guaifenesin (ROBITUSSIN) 100 MG/5ML syrup Take 200 mg by mouth 4 (four) times daily as needed for cough.    [provider]  insulin aspart (NOVOLOG) 100 UNIT/ML injection Inject 0-15 Units into the skin 4 (four) times daily -  before meals and at bedtime. Mealtime sliding scale: Blood Glucose 121 - 150: 1 units  BG 151 - 200: 2 units  BG 201 - 250: 3 units  BG 251 - 300: 5 units  BG 301 - 350: 7 units  BG 351 - 400: 9 units   Bedtime sliding scale: BG 70 - 200: 0 units  BG 201 - 250: 2 units  BG 251 - 300: 3 units  BG 301 - 350: 4 units  BG 351 - 400: 5 units Patient taking differently: Inject 0-9 Units into the skin 4 (four) times daily -  before meals and at bedtime.  11/07/18   Noralee Stain, DO  insulin glargine (LANTUS) 100 UNIT/ML injection Inject 0.15 mLs (15 Units total) into the skin at bedtime. Patient taking differently: Inject 15 Units into the skin 2 (two) times daily.  11/07/18 12/21/18  Noralee Stain, DO  levETIRAcetam (KEPPRA) 500 MG tablet Take 500 mg by mouth 2 (two) times daily.      [provider]  lisinopril (PRINIVIL,ZESTRIL) 20 MG tablet Take 20 mg by mouth daily.    [provider]  loperamide (IMODIUM) 2 MG capsule Take 2 mg by mouth every 4 (four) hours as needed for diarrhea or loose stools.    [provider]  LORazepam (ATIVAN) 0.5 MG tablet  Take 0.5 tablets (0.25 mg total) by mouth at bedtime. Patient not taking: Reported on 12/21/2018 11/07/18   Noralee Stain, DO  magnesium hydroxide (MILK OF MAGNESIA) 400 MG/5ML suspension Take 30 mLs by mouth at bedtime as needed for mild constipation.     [provider]  magnesium oxide (MAG-OX) 400 MG tablet Take 400 mg by mouth daily.    [provider]  Melatonin 3 MG TABS Take 6 mg by mouth at bedtime.     [provider]  metFORMIN (GLUCOPHAGE) 500 MG tablet Take 500 mg by mouth 2 (two) times daily.     [provider]  mirtazapine (REMERON) 30 MG tablet Take 30 mg by mouth at bedtime.     [provider]  neomycin-bacitracin-polymyxin (NEOSPORIN) ointment Apply 1 application topically daily as needed for wound care.    [provider]  pantoprazole (PROTONIX) 40 MG tablet Take 1 tablet (40 mg total) by mouth daily. 06/22/15   Catarina Hartshornat, David, MD  phenytoin (DILANTIN) 100 MG ER capsule Take 3 capsules (300 mg total) by mouth daily. 11/07/18   Noralee Stainhoi, Jennifer, DO  risperiDONE (RISPERDAL) 2 MG tablet Take 2 mg by mouth at bedtime.     [provider]    Family History Family History  Problem Relation Age of Onset  . Heart disease Mother   . Alzheimer's disease Father   . Breast cancer Sister   . Colon cancer Other        unknown  . Diabetes Sister     Social History Social History   Tobacco Use  . Smoking status: Former Smoker    Types: Cigars    Last attempt to quit: 04/14/1986    Years since quitting: 32.7  . Smokeless tobacco: Never Used  . Tobacco comment: Quit cigars in the 1980s  Substance Use Topics  . Alcohol use: No  . Drug use: No     Allergies   Patient has no known allergies.   Review of Systems Review of Systems Level 5 caveat because of dementia.   Physical Exam Updated Vital Signs BP 140/90   Pulse 78   Temp (!) 97.5 F (36.4 C) (Oral)   Resp 14   SpO2 99%   Physical Exam Vitals signs and  nursing note reviewed.  Constitutional:      General: He is not in acute distress.    Appearance: He is well-developed.  HENT:     Head: Normocephalic.     Comments: Small hematoma to forehead with some superficial abrasion. Eyes:     General:        Right eye: No discharge.        Left eye: No discharge.     Conjunctiva/sclera: Conjunctivae normal.  Neck:     Musculoskeletal: Neck supple.  Cardiovascular:     Rate and Rhythm: Normal rate and regular rhythm.     Heart sounds: Normal heart sounds. No murmur. No friction rub. No gallop.   Pulmonary:     Effort: Pulmonary effort is normal. No respiratory distress.     Breath sounds: Normal breath sounds.  Abdominal:     General: There is no distension.     Palpations: Abdomen is soft.     Tenderness: There is no abdominal tenderness.  Musculoskeletal:        General: No tenderness.  Skin:    General: Skin is warm and dry.  Neurological:     Mental Status: He is alert.     Comments: Laying in bed with eyes closed.  He does open his eyes when stimulated.  He becomes agitated and request people to leave him alone when they speak to him or touch him.  He is not following commands but appears to be moving all extremities without deficit.  Psychiatric:        Behavior: Behavior normal.        Thought Content: Thought content normal.      ED Treatments / Results  Labs (all labs ordered are listed, but only abnormal results are displayed) Labs Reviewed  CBC WITH DIFFERENTIAL/PLATELET - Abnormal; Notable for the following components:      Result Value   RBC 4.15 (*)    Hemoglobin 12.6 (*)  Monocytes Absolute 1.1 (*)    All other components within normal limits  BASIC METABOLIC PANEL - Abnormal; Notable for the following components:   Sodium 146 (*)    Glucose, Bld 58 (*)    All other components within normal limits  CBG MONITORING, ED - Abnormal; Notable for the following components:   Glucose-Capillary 32 (*)    All other  components within normal limits  CBG MONITORING, ED - Abnormal; Notable for the following components:   Glucose-Capillary 49 (*)    All other components within normal limits  CBG MONITORING, ED - Abnormal; Notable for the following components:   Glucose-Capillary 159 (*)    All other components within normal limits  CBG MONITORING, ED    EKG EKG Interpretation  Date/Time:  Friday December 20 2018 08:28:46 EST Ventricular Rate:  79 PR Interval:    QRS Duration: 114 QT Interval:  399 QTC Calculation: 458 R Axis:   49 Text Interpretation:  Sinus rhythm Short PR interval Borderline intraventricular conduction delay Borderline repolarization abnormality Minimal ST elevation, lateral leads Confirmed by Raeford Razor [id number]) on 12/20/2018 9:50:50 AM   Radiology No results found.   Ct Head Wo Contrast  Result Date: 12/21/2018 CLINICAL DATA:  Head injury after fall. EXAM: CT HEAD WITHOUT CONTRAST CT CERVICAL SPINE WITHOUT CONTRAST TECHNIQUE: Multidetector CT imaging of the head and cervical spine was performed following the standard protocol without intravenous contrast. Multiplanar CT image reconstructions of the cervical spine were also generated. COMPARISON:  CT scan of December 20, 2018. FINDINGS: CT HEAD FINDINGS Brain: Stable encephalomalacia is seen involving the left frontal and parietal cortex. Stable severe dilatation of both lateral ventricles is noted. No hemorrhage or acute infarction is noted. No mass lesion is noted. Vascular: No hyperdense vessel or unexpected calcification. Skull: Normal. Negative for fracture or focal lesion. Sinuses/Orbits: No acute finding. Other: None. CT CERVICAL SPINE FINDINGS Alignment: Minimal grade 1 anterolisthesis of C4-5 is noted secondary to posterior facet joint hypertrophy. Skull base and vertebrae: No acute fracture. No primary bone lesion or focal pathologic process. Soft tissues and spinal canal: No prevertebral fluid or swelling. No visible canal  hematoma. Disc levels: Moderate degenerative disc disease is noted at C3-4 and C4-5. Severe degenerative disc disease is noted at C5-6 and C6-7. Upper chest: Negative. Other: Degenerative changes are seen involving posterior facet joints bilaterally. IMPRESSION: Stable encephalomalacia is seen involving the left frontal and parietal cortex. Stable severe dilatation of both lateral ventricles is noted compared to prior exam. No significant changes noted compared to prior exam. Multilevel degenerative disc disease. No acute abnormality seen in the cervical spine. Electronically Signed   By: Lupita Raider, M.D.   On: 12/21/2018 19:31   Ct Head Wo Contrast  Result Date: 12/20/2018 CLINICAL DATA:  Fall EXAM: CT HEAD WITHOUT CONTRAST CT CERVICAL SPINE WITHOUT CONTRAST TECHNIQUE: Multidetector CT imaging of the head and cervical spine was performed following the standard protocol without intravenous contrast. Multiplanar CT image reconstructions of the cervical spine were also generated. COMPARISON:  12/11/2018 FINDINGS: CT HEAD FINDINGS Brain: No evidence of acute infarction, hemorrhage, extra-axial collection or mass lesion/mass effect. No change in gross dilatation of the lateral ventricles and cystic encephalomalacia of the anterior left hemisphere. Vascular: No hyperdense vessel or unexpected calcification. Skull: Normal. Negative for fracture or focal lesion. Sinuses/Orbits: No acute finding. Other: None. CT CERVICAL SPINE FINDINGS Alignment: Normal. Skull base and vertebrae: No acute fracture. No primary bone lesion or focal pathologic process. Soft  tissues and spinal canal: No prevertebral fluid or swelling. No visible canal hematoma. Disc levels: Moderate to severe multilevel disc degenerative disease and osteophytosis. Upper chest: Negative. Other: None. IMPRESSION: 1.  No acute intracranial pathology. 2. No change in gross dilatation of the lateral ventricles and cystic encephalomalacia of the anterior left  hemisphere. 3.  No fracture or static subluxation of the cervical spine. 4.  Multilevel disc degenerative disease of the cervical spine. Electronically Signed   By: Lauralyn Primes M.D.   On: 12/20/2018 11:35   Ct Head Wo Contrast  Result Date: 12/11/2018 CLINICAL DATA:  Unwitnessed fall with headache and neck pain EXAM: CT HEAD WITHOUT CONTRAST CT CERVICAL SPINE WITHOUT CONTRAST TECHNIQUE: Multidetector CT imaging of the head and cervical spine was performed following the standard protocol without intravenous contrast. Multiplanar CT image reconstructions of the cervical spine were also generated. COMPARISON:  11/05/2018, MRI 11/01/2018, CT cervical spine 10/22/2018 FINDINGS: CT HEAD FINDINGS Brain: No acute territorial infarction, hemorrhage or intracranial mass is visualized. 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 Stable white matter hypodensity. Stable marked enlargement of the ventricles. Cerebellar atrophy. Vascular: No hyperdense vessels.  Carotid vascular calcification Skull: No fracture Sinuses/Orbits: No acute finding. Other: None CT CERVICAL SPINE FINDINGS Alignment: Stable alignment. Trace anterolisthesis C4 on C5. Facet alignment within normal limits Skull base and vertebrae: No acute fracture. No primary bone lesion or focal pathologic process. Soft tissues and spinal canal: Borderline thickening of the prevertebral soft tissues. No visible canal hematoma. Disc levels: Moderate severe diffuse degenerative changes C3 through C7 with disc space narrowing and osteophyte. Posterior disc osteophyte at C3-C4, C4-C5 and C5-C6. Upper chest: Heterogenous enlarged left lobe of thyroid with multiple nodules and calcifications. Additional nodules in the right lobe. Other: None IMPRESSION: 1. No CT evidence for acute intracranial abnormality. Chronic congenital abnormalities as previously described. 2. Stable cervical spine alignment with  multiple level moderate severe degenerative change. No acute osseous abnormality. Borderline enlargement of the prevertebral soft tissues. If ligamentous injury is a concern, further evaluation with MRI should be obtained. Electronically Signed   By: Jasmine Pang M.D.   On: 12/11/2018 02:59   Ct Cervical Spine Wo Contrast  Result Date: 12/21/2018 CLINICAL DATA:  Head injury after fall. EXAM: CT HEAD WITHOUT CONTRAST CT CERVICAL SPINE WITHOUT CONTRAST TECHNIQUE: Multidetector CT imaging of the head and cervical spine was performed following the standard protocol without intravenous contrast. Multiplanar CT image reconstructions of the cervical spine were also generated. COMPARISON:  CT scan of December 20, 2018. FINDINGS: CT HEAD FINDINGS Brain: Stable encephalomalacia is seen involving the left frontal and parietal cortex. Stable severe dilatation of both lateral ventricles is noted. No hemorrhage or acute infarction is noted. No mass lesion is noted. Vascular: No hyperdense vessel or unexpected calcification. Skull: Normal. Negative for fracture or focal lesion. Sinuses/Orbits: No acute finding. Other: None. CT CERVICAL SPINE FINDINGS Alignment: Minimal grade 1 anterolisthesis of C4-5 is noted secondary to posterior facet joint hypertrophy. Skull base and vertebrae: No acute fracture. No primary bone lesion or focal pathologic process. Soft tissues and spinal canal: No prevertebral fluid or swelling. No visible canal hematoma. Disc levels: Moderate degenerative disc disease is noted at C3-4 and C4-5. Severe degenerative disc disease is noted at C5-6 and C6-7. Upper chest: Negative. Other: Degenerative changes are seen involving posterior facet joints bilaterally. IMPRESSION: Stable encephalomalacia is seen involving the left frontal and parietal cortex. Stable severe dilatation of both  lateral ventricles is noted compared to prior exam. No significant changes noted compared to prior exam. Multilevel degenerative  disc disease. No acute abnormality seen in the cervical spine. Electronically Signed   By: Lupita Raider, M.D.   On: 12/21/2018 19:31   Ct Cervical Spine Wo Contrast  Result Date: 12/20/2018 CLINICAL DATA:  Fall EXAM: CT HEAD WITHOUT CONTRAST CT CERVICAL SPINE WITHOUT CONTRAST TECHNIQUE: Multidetector CT imaging of the head and cervical spine was performed following the standard protocol without intravenous contrast. Multiplanar CT image reconstructions of the cervical spine were also generated. COMPARISON:  12/11/2018 FINDINGS: CT HEAD FINDINGS Brain: No evidence of acute infarction, hemorrhage, extra-axial collection or mass lesion/mass effect. No change in gross dilatation of the lateral ventricles and cystic encephalomalacia of the anterior left hemisphere. Vascular: No hyperdense vessel or unexpected calcification. Skull: Normal. Negative for fracture or focal lesion. Sinuses/Orbits: No acute finding. Other: None. CT CERVICAL SPINE FINDINGS Alignment: Normal. Skull base and vertebrae: No acute fracture. No primary bone lesion or focal pathologic process. Soft tissues and spinal canal: No prevertebral fluid or swelling. No visible canal hematoma. Disc levels: Moderate to severe multilevel disc degenerative disease and osteophytosis. Upper chest: Negative. Other: None. IMPRESSION: 1.  No acute intracranial pathology. 2. No change in gross dilatation of the lateral ventricles and cystic encephalomalacia of the anterior left hemisphere. 3.  No fracture or static subluxation of the cervical spine. 4.  Multilevel disc degenerative disease of the cervical spine. Electronically Signed   By: Lauralyn Primes M.D.   On: 12/20/2018 11:35   Ct Cervical Spine Wo Contrast  Result Date: 12/11/2018 CLINICAL DATA:  Unwitnessed fall with headache and neck pain EXAM: CT HEAD WITHOUT CONTRAST CT CERVICAL SPINE WITHOUT CONTRAST TECHNIQUE: Multidetector CT imaging of the head and cervical spine was performed following the  standard protocol without intravenous contrast. Multiplanar CT image reconstructions of the cervical spine were also generated. COMPARISON:  11/05/2018, MRI 11/01/2018, CT cervical spine 10/22/2018 FINDINGS: CT HEAD FINDINGS Brain: No acute territorial infarction, hemorrhage or intracranial mass is visualized. 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 Stable white matter hypodensity. Stable marked enlargement of the ventricles. Cerebellar atrophy. Vascular: No hyperdense vessels.  Carotid vascular calcification Skull: No fracture Sinuses/Orbits: No acute finding. Other: None CT CERVICAL SPINE FINDINGS Alignment: Stable alignment. Trace anterolisthesis C4 on C5. Facet alignment within normal limits Skull base and vertebrae: No acute fracture. No primary bone lesion or focal pathologic process. Soft tissues and spinal canal: Borderline thickening of the prevertebral soft tissues. No visible canal hematoma. Disc levels: Moderate severe diffuse degenerative changes C3 through C7 with disc space narrowing and osteophyte. Posterior disc osteophyte at C3-C4, C4-C5 and C5-C6. Upper chest: Heterogenous enlarged left lobe of thyroid with multiple nodules and calcifications. Additional nodules in the right lobe. Other: None IMPRESSION: 1. No CT evidence for acute intracranial abnormality. Chronic congenital abnormalities as previously described. 2. Stable cervical spine alignment with multiple level moderate severe degenerative change. No acute osseous abnormality. Borderline enlargement of the prevertebral soft tissues. If ligamentous injury is a concern, further evaluation with MRI should be obtained. Electronically Signed   By: Jasmine Pang M.D.   On: 12/11/2018 02:59    Procedures Procedures (including critical care time)  Medications Ordered in ED Medications  glucagon (human recombinant) (GLUCAGEN) injection 1 mg (1 mg Intramuscular Given  12/20/18 0833)  dextrose 50 % solution 50 mL (50 mLs Intravenous Given 12/20/18 0917)  LORazepam (  ATIVAN) injection 1 mg (1 mg Intravenous Given 12/20/18 0919)     Initial Impression / Assessment and Plan / ED Course  I have reviewed the triage vital signs and the nursing notes.  Pertinent labs & imaging results that were available during my care of the patient were reviewed by me and considered in my medical decision making (see chart for details).    I have reviewed the triage vital signs and the nursing notes. Prior records were reviewed for additional information.    Pertinent labs & imaging results that were available during my care of the patient were reviewed by me and considered in my medical decision making (see chart for details).  73 year old male present after fall.  Might be related hypoglycemia.  This is corrected and remained stable.  Head CT without acute abnormality.  Is reportedly at his baseline mental status.  Final Clinical Impressions(s) / ED Diagnoses   Final diagnoses:  Injury of head, initial encounter  Hypoglycemia    ED Discharge Orders    None       Raeford Razor, MD 12/26/18 1005

## 2019-01-01 DIAGNOSIS — I10 Essential (primary) hypertension: Secondary | ICD-10-CM | POA: Diagnosis not present

## 2019-01-01 DIAGNOSIS — R569 Unspecified convulsions: Secondary | ICD-10-CM | POA: Diagnosis not present

## 2019-01-06 DIAGNOSIS — E11649 Type 2 diabetes mellitus with hypoglycemia without coma: Secondary | ICD-10-CM | POA: Diagnosis not present

## 2019-01-06 DIAGNOSIS — S7291XA Unspecified fracture of right femur, initial encounter for closed fracture: Secondary | ICD-10-CM | POA: Diagnosis not present

## 2019-01-06 DIAGNOSIS — G4700 Insomnia, unspecified: Secondary | ICD-10-CM | POA: Diagnosis not present

## 2019-01-06 DIAGNOSIS — R569 Unspecified convulsions: Secondary | ICD-10-CM | POA: Diagnosis not present

## 2019-01-06 DIAGNOSIS — R296 Repeated falls: Secondary | ICD-10-CM | POA: Diagnosis not present

## 2019-01-06 DIAGNOSIS — I6930 Unspecified sequelae of cerebral infarction: Secondary | ICD-10-CM | POA: Diagnosis not present

## 2019-01-06 DIAGNOSIS — G300 Alzheimer's disease with early onset: Secondary | ICD-10-CM | POA: Diagnosis not present

## 2019-01-06 DIAGNOSIS — I10 Essential (primary) hypertension: Secondary | ICD-10-CM | POA: Diagnosis not present

## 2019-01-10 DIAGNOSIS — R278 Other lack of coordination: Secondary | ICD-10-CM | POA: Diagnosis not present

## 2019-01-10 DIAGNOSIS — M6281 Muscle weakness (generalized): Secondary | ICD-10-CM | POA: Diagnosis not present

## 2019-01-10 DIAGNOSIS — R293 Abnormal posture: Secondary | ICD-10-CM | POA: Diagnosis not present

## 2019-01-14 DIAGNOSIS — F323 Major depressive disorder, single episode, severe with psychotic features: Secondary | ICD-10-CM | POA: Diagnosis not present

## 2019-01-14 DIAGNOSIS — F321 Major depressive disorder, single episode, moderate: Secondary | ICD-10-CM | POA: Diagnosis not present

## 2019-01-14 DIAGNOSIS — F5105 Insomnia due to other mental disorder: Secondary | ICD-10-CM | POA: Diagnosis not present

## 2019-01-14 DIAGNOSIS — R419 Unspecified symptoms and signs involving cognitive functions and awareness: Secondary | ICD-10-CM | POA: Diagnosis not present

## 2019-01-15 DIAGNOSIS — R278 Other lack of coordination: Secondary | ICD-10-CM | POA: Diagnosis not present

## 2019-01-15 DIAGNOSIS — R293 Abnormal posture: Secondary | ICD-10-CM | POA: Diagnosis not present

## 2019-01-15 DIAGNOSIS — M6281 Muscle weakness (generalized): Secondary | ICD-10-CM | POA: Diagnosis not present

## 2019-01-16 DIAGNOSIS — R293 Abnormal posture: Secondary | ICD-10-CM | POA: Diagnosis not present

## 2019-01-16 DIAGNOSIS — R278 Other lack of coordination: Secondary | ICD-10-CM | POA: Diagnosis not present

## 2019-01-16 DIAGNOSIS — M6281 Muscle weakness (generalized): Secondary | ICD-10-CM | POA: Diagnosis not present

## 2019-01-17 DIAGNOSIS — R2689 Other abnormalities of gait and mobility: Secondary | ICD-10-CM | POA: Diagnosis not present

## 2019-01-17 DIAGNOSIS — R269 Unspecified abnormalities of gait and mobility: Secondary | ICD-10-CM | POA: Diagnosis not present

## 2019-01-17 DIAGNOSIS — R293 Abnormal posture: Secondary | ICD-10-CM | POA: Diagnosis not present

## 2019-01-17 DIAGNOSIS — R278 Other lack of coordination: Secondary | ICD-10-CM | POA: Diagnosis not present

## 2019-01-17 DIAGNOSIS — M6281 Muscle weakness (generalized): Secondary | ICD-10-CM | POA: Diagnosis not present

## 2019-01-20 DIAGNOSIS — R2689 Other abnormalities of gait and mobility: Secondary | ICD-10-CM | POA: Diagnosis not present

## 2019-01-20 DIAGNOSIS — R269 Unspecified abnormalities of gait and mobility: Secondary | ICD-10-CM | POA: Diagnosis not present

## 2019-01-20 DIAGNOSIS — M6281 Muscle weakness (generalized): Secondary | ICD-10-CM | POA: Diagnosis not present

## 2019-01-21 DIAGNOSIS — R293 Abnormal posture: Secondary | ICD-10-CM | POA: Diagnosis not present

## 2019-01-21 DIAGNOSIS — R278 Other lack of coordination: Secondary | ICD-10-CM | POA: Diagnosis not present

## 2019-01-21 DIAGNOSIS — M6281 Muscle weakness (generalized): Secondary | ICD-10-CM | POA: Diagnosis not present

## 2019-01-22 DIAGNOSIS — R278 Other lack of coordination: Secondary | ICD-10-CM | POA: Diagnosis not present

## 2019-01-22 DIAGNOSIS — M6281 Muscle weakness (generalized): Secondary | ICD-10-CM | POA: Diagnosis not present

## 2019-01-22 DIAGNOSIS — R269 Unspecified abnormalities of gait and mobility: Secondary | ICD-10-CM | POA: Diagnosis not present

## 2019-01-22 DIAGNOSIS — R293 Abnormal posture: Secondary | ICD-10-CM | POA: Diagnosis not present

## 2019-01-22 DIAGNOSIS — R2689 Other abnormalities of gait and mobility: Secondary | ICD-10-CM | POA: Diagnosis not present

## 2019-01-24 DIAGNOSIS — R293 Abnormal posture: Secondary | ICD-10-CM | POA: Diagnosis not present

## 2019-01-24 DIAGNOSIS — M6281 Muscle weakness (generalized): Secondary | ICD-10-CM | POA: Diagnosis not present

## 2019-01-24 DIAGNOSIS — R278 Other lack of coordination: Secondary | ICD-10-CM | POA: Diagnosis not present

## 2019-01-24 IMAGING — CT CT CERVICAL SPINE W/O CM
4 of 8 series · 13 of 33 positions shown, 14 images · non-contrast
Comparison: CT HEAD September 02, 2015 and CT cervical spine June 15, 2015 and MRI of the head July 04, 2012

CLINICAL DATA: Unwitnessed fall in bedroom. Forehead hematoma and
headache. History of stroke, hypertension, diabetes and epilepsy.

EXAM:
CT HEAD WITHOUT CONTRAST
CT CERVICAL SPINE WITHOUT CONTRAST
TECHNIQUE: Multidetector CT imaging of the head and cervical spine was
performed following the standard protocol without intravenous
contrast. Multiplanar CT image reconstructions of the cervical spine
were also generated.

[Series 5: coronal · coronal · 0.31mm/px · 2 of 70 slices shown]
[im 24/70  bone]
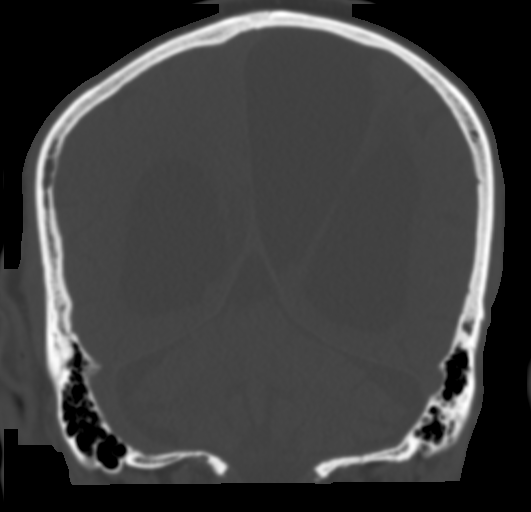
[im 47/70  bone]
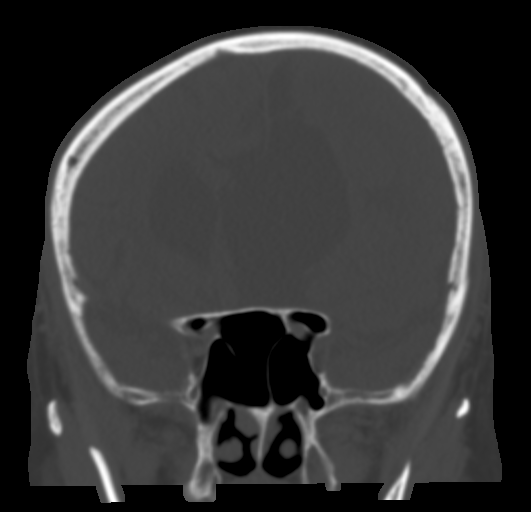

[Series 6: sagittal · sagittal · 0.31mm/px · 5 of 53 slices shown]
[im 9/53  bone]
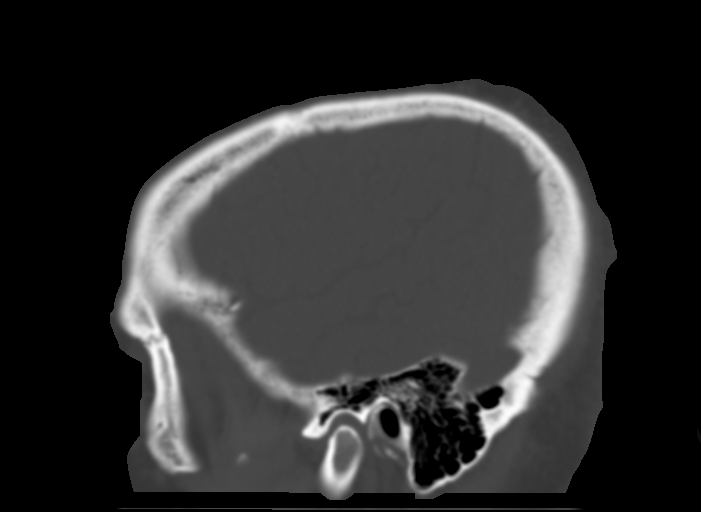
[im 18/53  bone]
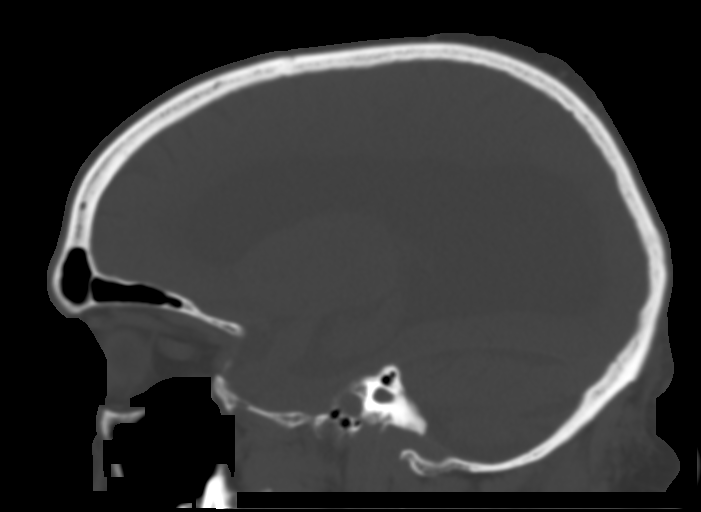
[im 27/53  bone]
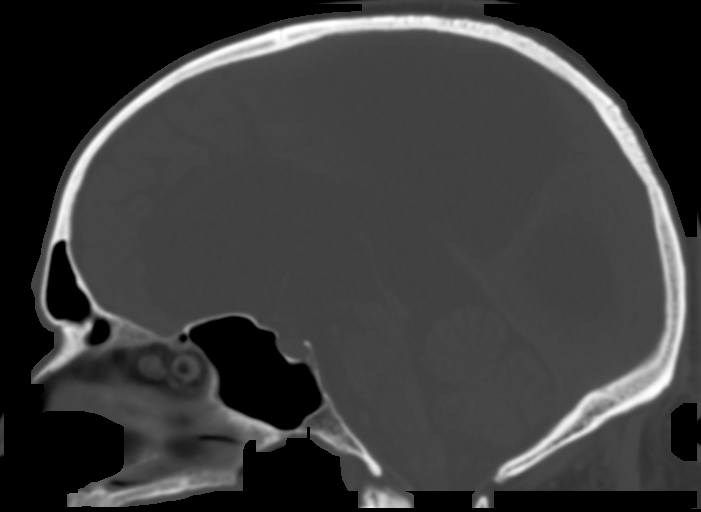
[im 35/53  bone]
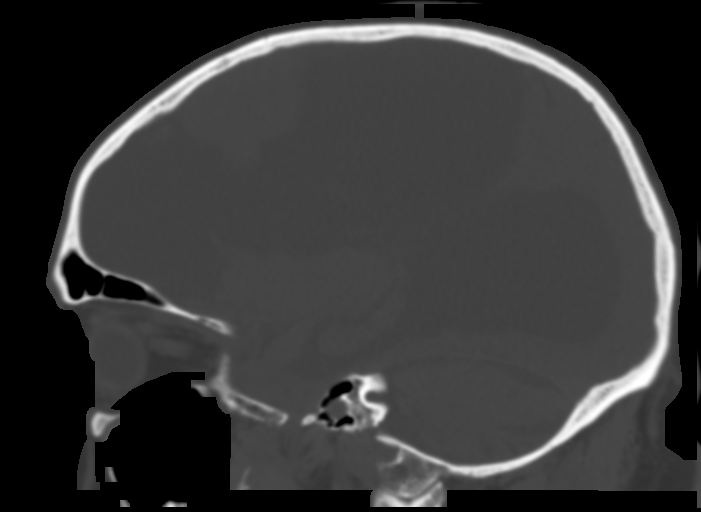
[im 44/53  bone]
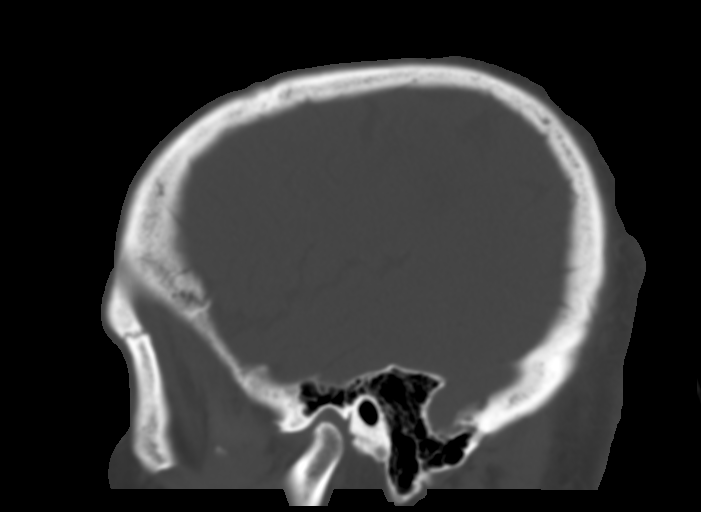

[Series 7: c-spine st · axial · 0.34mm/px · z∈[+828,+920]mm · 3 of 92 slices shown, 4 images]
[im 23/92  soft-tissue]
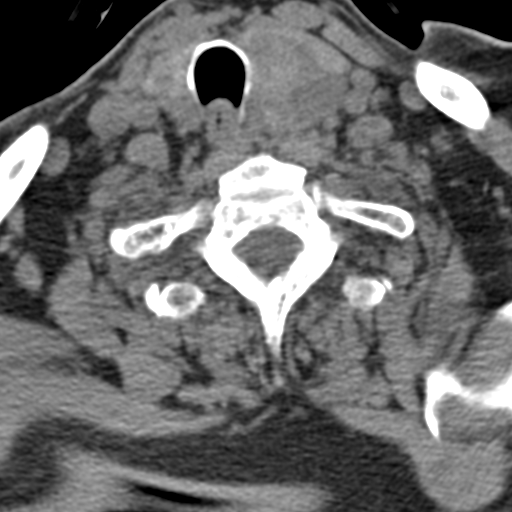
[im 23/92  bone]
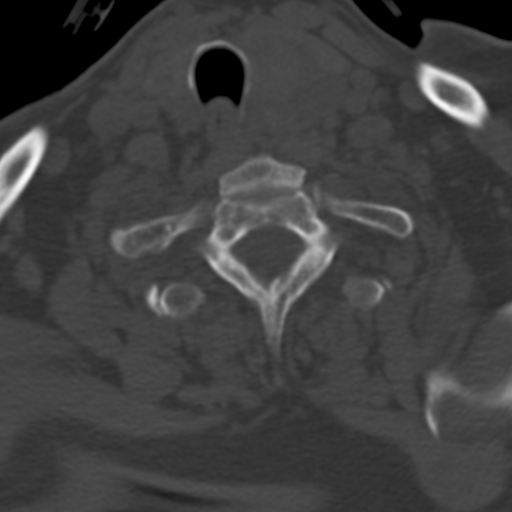
[im 46/92  bone]
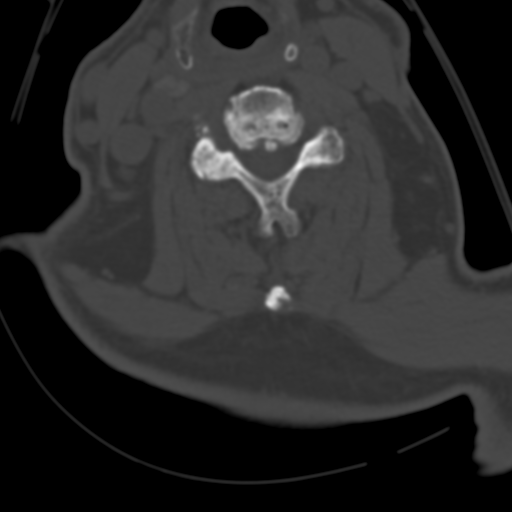
[im 69/92  bone]
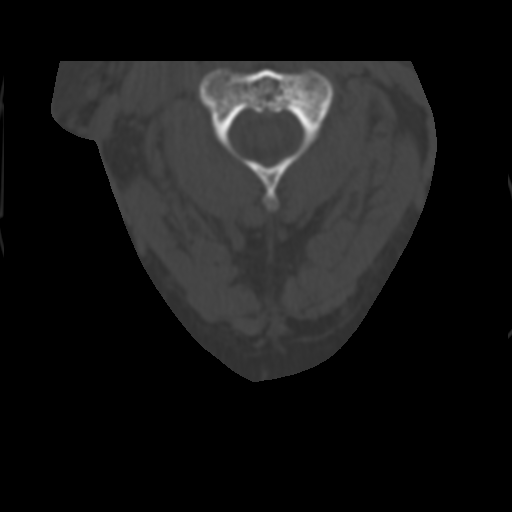

[Series 9: axial recon · axial · 0.21mm/px · z∈[+812,+899]mm · 3 of 96 slices shown]
[im 24/96  bone]
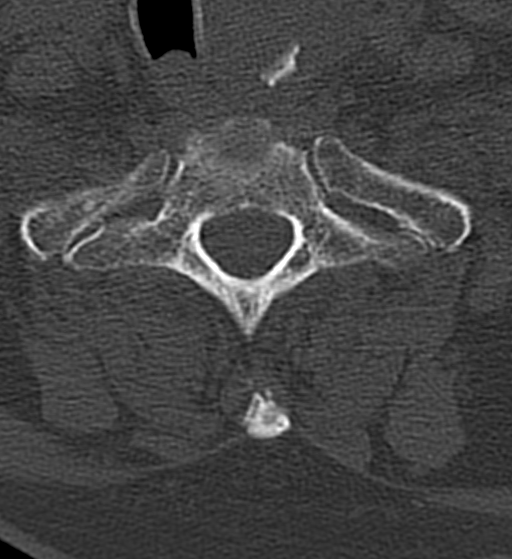
[im 48/96  bone]
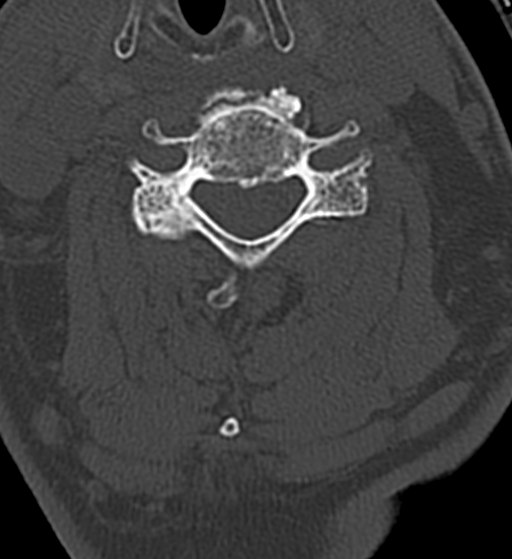
[im 72/96  bone]
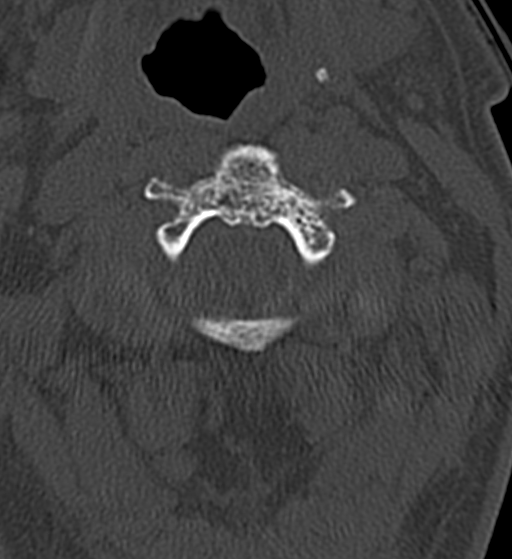

[13 of 33 positions shown; findings below may reference images not displayed]

FINDINGS: CT HEAD FINDINGS

BRAIN: No intraparenchymal hemorrhage, mass effect, midline shift or
acute large vascular territory infarcts. Severe ventriculomegaly
with porencephaly, similar LEFT greater than RIGHT interhemispheric
cysts. Agenesis of the corpus callosum. LEFT frontal cortical
dysplasia better seen on prior MRI. Old RIGHT occipital lobe
infarct. Mild cerebellar volume loss likely due to chronic seizure
medication. No abnormal extra-axial fluid collections. Basal
cisterns are patent.

VASCULAR: Mild calcific atherosclerosis of the carotid siphons.

SKULL: No skull fracture. Mild RIGHT temporomandibular
osteoarthrosis. Small frontal scalp hematoma without subcutaneous
gas or radiopaque foreign bodies.

SINUSES/ORBITS: Trace paranasal sinus mucosal thickening without
air-fluid levels. Mastoid air cells are well aerated.The included
ocular globes and orbital contents are non-suspicious.

OTHER: None.

CT CERVICAL SPINE FINDINGS

ALIGNMENT: Maintained lordosis. Vertebral bodies in alignment.

SKULL BASE AND VERTEBRAE: Cervical vertebral bodies and posterior
elements are intact. Moderate C3-4, C5-6 and C6-7 disc height loss
with similar endplate sclerosis and marginal spurring compatible
with degenerative discs. Stable irregular sclerosis C3 most
compatible with atypical hemangioma. C1-2 articulation maintained
with moderate arthropathy.

SOFT TISSUES AND SPINAL CANAL: Nonacute. Bulky nuchal ligament
calcification. Mild calcific atherosclerosis of the carotid
bifurcations. Thyromegaly and multiple calcifications most
compatible with multinodular goiter.

DISC LEVELS: Moderate C3-4 canal stenosis, mild at C4-5. Severe C3-4
thru C6-7 neural foraminal narrowing.

UPPER CHEST: Lung apices are clear.

OTHER: None.
IMPRESSION: CT HEAD: Small frontal scalp hematoma without skull fracture. No
acute intracranial process.

Stable examination including agenesis of the corpus callosum with
LEFT frontal cortical dysplasia and interhemispheric cyst.

CT CERVICAL SPINE: No acute fracture or malalignment. Stable
degenerative change.

## 2019-01-27 DIAGNOSIS — R2689 Other abnormalities of gait and mobility: Secondary | ICD-10-CM | POA: Diagnosis not present

## 2019-01-27 DIAGNOSIS — R269 Unspecified abnormalities of gait and mobility: Secondary | ICD-10-CM | POA: Diagnosis not present

## 2019-01-27 DIAGNOSIS — M6281 Muscle weakness (generalized): Secondary | ICD-10-CM | POA: Diagnosis not present

## 2019-01-28 DIAGNOSIS — M6281 Muscle weakness (generalized): Secondary | ICD-10-CM | POA: Diagnosis not present

## 2019-01-28 DIAGNOSIS — R293 Abnormal posture: Secondary | ICD-10-CM | POA: Diagnosis not present

## 2019-01-28 DIAGNOSIS — R278 Other lack of coordination: Secondary | ICD-10-CM | POA: Diagnosis not present

## 2019-01-29 DIAGNOSIS — M6281 Muscle weakness (generalized): Secondary | ICD-10-CM | POA: Diagnosis not present

## 2019-01-29 DIAGNOSIS — R2689 Other abnormalities of gait and mobility: Secondary | ICD-10-CM | POA: Diagnosis not present

## 2019-01-29 DIAGNOSIS — R269 Unspecified abnormalities of gait and mobility: Secondary | ICD-10-CM | POA: Diagnosis not present

## 2019-01-30 DIAGNOSIS — R269 Unspecified abnormalities of gait and mobility: Secondary | ICD-10-CM | POA: Diagnosis not present

## 2019-01-30 DIAGNOSIS — M6281 Muscle weakness (generalized): Secondary | ICD-10-CM | POA: Diagnosis not present

## 2019-01-30 DIAGNOSIS — R278 Other lack of coordination: Secondary | ICD-10-CM | POA: Diagnosis not present

## 2019-01-30 DIAGNOSIS — R293 Abnormal posture: Secondary | ICD-10-CM | POA: Diagnosis not present

## 2019-01-30 DIAGNOSIS — R2689 Other abnormalities of gait and mobility: Secondary | ICD-10-CM | POA: Diagnosis not present

## 2019-01-31 DIAGNOSIS — M6281 Muscle weakness (generalized): Secondary | ICD-10-CM | POA: Diagnosis not present

## 2019-01-31 DIAGNOSIS — R278 Other lack of coordination: Secondary | ICD-10-CM | POA: Diagnosis not present

## 2019-01-31 DIAGNOSIS — R293 Abnormal posture: Secondary | ICD-10-CM | POA: Diagnosis not present

## 2019-02-03 DIAGNOSIS — R2689 Other abnormalities of gait and mobility: Secondary | ICD-10-CM | POA: Diagnosis not present

## 2019-02-03 DIAGNOSIS — R05 Cough: Secondary | ICD-10-CM | POA: Diagnosis not present

## 2019-02-03 DIAGNOSIS — M6281 Muscle weakness (generalized): Secondary | ICD-10-CM | POA: Diagnosis not present

## 2019-02-03 DIAGNOSIS — R296 Repeated falls: Secondary | ICD-10-CM | POA: Diagnosis not present

## 2019-02-03 DIAGNOSIS — I10 Essential (primary) hypertension: Secondary | ICD-10-CM | POA: Diagnosis not present

## 2019-02-03 DIAGNOSIS — G4089 Other seizures: Secondary | ICD-10-CM | POA: Diagnosis not present

## 2019-02-03 DIAGNOSIS — G300 Alzheimer's disease with early onset: Secondary | ICD-10-CM | POA: Diagnosis not present

## 2019-02-03 DIAGNOSIS — E11649 Type 2 diabetes mellitus with hypoglycemia without coma: Secondary | ICD-10-CM | POA: Diagnosis not present

## 2019-02-03 DIAGNOSIS — G4700 Insomnia, unspecified: Secondary | ICD-10-CM | POA: Diagnosis not present

## 2019-02-03 DIAGNOSIS — R509 Fever, unspecified: Secondary | ICD-10-CM | POA: Diagnosis not present

## 2019-02-03 DIAGNOSIS — S7291XA Unspecified fracture of right femur, initial encounter for closed fracture: Secondary | ICD-10-CM | POA: Diagnosis not present

## 2019-02-03 DIAGNOSIS — R269 Unspecified abnormalities of gait and mobility: Secondary | ICD-10-CM | POA: Diagnosis not present

## 2019-02-05 DIAGNOSIS — D649 Anemia, unspecified: Secondary | ICD-10-CM | POA: Diagnosis not present

## 2019-02-05 DIAGNOSIS — E11649 Type 2 diabetes mellitus with hypoglycemia without coma: Secondary | ICD-10-CM | POA: Diagnosis not present

## 2019-02-05 DIAGNOSIS — R05 Cough: Secondary | ICD-10-CM | POA: Diagnosis not present

## 2019-02-06 DIAGNOSIS — R05 Cough: Secondary | ICD-10-CM | POA: Diagnosis not present

## 2019-02-06 DIAGNOSIS — R509 Fever, unspecified: Secondary | ICD-10-CM | POA: Diagnosis not present

## 2019-02-10 DIAGNOSIS — G4089 Other seizures: Secondary | ICD-10-CM | POA: Diagnosis not present

## 2019-02-10 DIAGNOSIS — R05 Cough: Secondary | ICD-10-CM | POA: Diagnosis not present

## 2019-02-10 DIAGNOSIS — S7291XA Unspecified fracture of right femur, initial encounter for closed fracture: Secondary | ICD-10-CM | POA: Diagnosis not present

## 2019-02-10 DIAGNOSIS — I10 Essential (primary) hypertension: Secondary | ICD-10-CM | POA: Diagnosis not present

## 2019-02-10 DIAGNOSIS — E11649 Type 2 diabetes mellitus with hypoglycemia without coma: Secondary | ICD-10-CM | POA: Diagnosis not present

## 2019-02-10 DIAGNOSIS — G4700 Insomnia, unspecified: Secondary | ICD-10-CM | POA: Diagnosis not present

## 2019-02-10 DIAGNOSIS — G300 Alzheimer's disease with early onset: Secondary | ICD-10-CM | POA: Diagnosis not present

## 2019-02-10 DIAGNOSIS — R509 Fever, unspecified: Secondary | ICD-10-CM | POA: Diagnosis not present

## 2019-02-10 DIAGNOSIS — L89309 Pressure ulcer of unspecified buttock, unspecified stage: Secondary | ICD-10-CM | POA: Diagnosis not present

## 2019-02-10 DIAGNOSIS — I639 Cerebral infarction, unspecified: Secondary | ICD-10-CM | POA: Diagnosis not present

## 2019-02-13 ENCOUNTER — Encounter (HOSPITAL_COMMUNITY): Payer: Self-pay | Admitting: Internal Medicine

## 2019-02-13 ENCOUNTER — Emergency Department (HOSPITAL_COMMUNITY): Payer: Medicare Other

## 2019-02-13 ENCOUNTER — Inpatient Hospital Stay (HOSPITAL_COMMUNITY)
Admission: EM | Admit: 2019-02-13 | Discharge: 2019-02-24 | DRG: 871 | Disposition: A | Discharge status: Skilled Nursing Facility | Payer: Medicare Other | Source: Skilled Nursing Facility | Attending: Family Medicine | Admitting: Family Medicine

## 2019-02-13 ENCOUNTER — Other Ambulatory Visit: Payer: Self-pay

## 2019-02-13 DIAGNOSIS — Z820 Family history of epilepsy and other diseases of the nervous system: Secondary | ICD-10-CM | POA: Diagnosis not present

## 2019-02-13 DIAGNOSIS — N179 Acute kidney failure, unspecified: Secondary | ICD-10-CM | POA: Diagnosis not present

## 2019-02-13 DIAGNOSIS — E876 Hypokalemia: Secondary | ICD-10-CM | POA: Diagnosis not present

## 2019-02-13 DIAGNOSIS — I69319 Unspecified symptoms and signs involving cognitive functions following cerebral infarction: Secondary | ICD-10-CM

## 2019-02-13 DIAGNOSIS — I69398 Other sequelae of cerebral infarction: Secondary | ICD-10-CM | POA: Diagnosis not present

## 2019-02-13 DIAGNOSIS — E860 Dehydration: Secondary | ICD-10-CM | POA: Diagnosis not present

## 2019-02-13 DIAGNOSIS — G40909 Epilepsy, unspecified, not intractable, without status epilepticus: Secondary | ICD-10-CM

## 2019-02-13 DIAGNOSIS — Z803 Family history of malignant neoplasm of breast: Secondary | ICD-10-CM

## 2019-02-13 DIAGNOSIS — R531 Weakness: Secondary | ICD-10-CM | POA: Diagnosis not present

## 2019-02-13 DIAGNOSIS — E785 Hyperlipidemia, unspecified: Secondary | ICD-10-CM | POA: Diagnosis not present

## 2019-02-13 DIAGNOSIS — N189 Chronic kidney disease, unspecified: Secondary | ICD-10-CM | POA: Diagnosis present

## 2019-02-13 DIAGNOSIS — E870 Hyperosmolality and hypernatremia: Secondary | ICD-10-CM | POA: Diagnosis present

## 2019-02-13 DIAGNOSIS — Z7982 Long term (current) use of aspirin: Secondary | ICD-10-CM | POA: Diagnosis not present

## 2019-02-13 DIAGNOSIS — G9341 Metabolic encephalopathy: Secondary | ICD-10-CM | POA: Diagnosis present

## 2019-02-13 DIAGNOSIS — N400 Enlarged prostate without lower urinary tract symptoms: Secondary | ICD-10-CM | POA: Diagnosis present

## 2019-02-13 DIAGNOSIS — I10 Essential (primary) hypertension: Secondary | ICD-10-CM | POA: Diagnosis present

## 2019-02-13 DIAGNOSIS — R4182 Altered mental status, unspecified: Secondary | ICD-10-CM | POA: Diagnosis present

## 2019-02-13 DIAGNOSIS — A4189 Other specified sepsis: Principal | ICD-10-CM | POA: Diagnosis present

## 2019-02-13 DIAGNOSIS — R404 Transient alteration of awareness: Secondary | ICD-10-CM | POA: Diagnosis not present

## 2019-02-13 DIAGNOSIS — R5381 Other malaise: Secondary | ICD-10-CM | POA: Diagnosis not present

## 2019-02-13 DIAGNOSIS — L89229 Pressure ulcer of left hip, unspecified stage: Secondary | ICD-10-CM | POA: Diagnosis present

## 2019-02-13 DIAGNOSIS — Z96641 Presence of right artificial hip joint: Secondary | ICD-10-CM | POA: Diagnosis present

## 2019-02-13 DIAGNOSIS — K5790 Diverticulosis of intestine, part unspecified, without perforation or abscess without bleeding: Secondary | ICD-10-CM | POA: Diagnosis present

## 2019-02-13 DIAGNOSIS — Z8249 Family history of ischemic heart disease and other diseases of the circulatory system: Secondary | ICD-10-CM

## 2019-02-13 DIAGNOSIS — F03918 Unspecified dementia, unspecified severity, with other behavioral disturbance: Secondary | ICD-10-CM

## 2019-02-13 DIAGNOSIS — I2510 Atherosclerotic heart disease of native coronary artery without angina pectoris: Secondary | ICD-10-CM | POA: Diagnosis present

## 2019-02-13 DIAGNOSIS — J189 Pneumonia, unspecified organism: Secondary | ICD-10-CM | POA: Diagnosis not present

## 2019-02-13 DIAGNOSIS — Z20828 Contact with and (suspected) exposure to other viral communicable diseases: Secondary | ICD-10-CM | POA: Diagnosis present

## 2019-02-13 DIAGNOSIS — E119 Type 2 diabetes mellitus without complications: Secondary | ICD-10-CM

## 2019-02-13 DIAGNOSIS — E1122 Type 2 diabetes mellitus with diabetic chronic kidney disease: Secondary | ICD-10-CM | POA: Diagnosis present

## 2019-02-13 DIAGNOSIS — D72829 Elevated white blood cell count, unspecified: Secondary | ICD-10-CM | POA: Diagnosis not present

## 2019-02-13 DIAGNOSIS — Z66 Do not resuscitate: Secondary | ICD-10-CM | POA: Diagnosis not present

## 2019-02-13 DIAGNOSIS — M2550 Pain in unspecified joint: Secondary | ICD-10-CM | POA: Diagnosis not present

## 2019-02-13 DIAGNOSIS — I69391 Dysphagia following cerebral infarction: Secondary | ICD-10-CM

## 2019-02-13 DIAGNOSIS — R0689 Other abnormalities of breathing: Secondary | ICD-10-CM | POA: Diagnosis not present

## 2019-02-13 DIAGNOSIS — A419 Sepsis, unspecified organism: Secondary | ICD-10-CM | POA: Diagnosis not present

## 2019-02-13 DIAGNOSIS — Z794 Long term (current) use of insulin: Secondary | ICD-10-CM | POA: Diagnosis not present

## 2019-02-13 DIAGNOSIS — I129 Hypertensive chronic kidney disease with stage 1 through stage 4 chronic kidney disease, or unspecified chronic kidney disease: Secondary | ICD-10-CM | POA: Diagnosis present

## 2019-02-13 DIAGNOSIS — G4733 Obstructive sleep apnea (adult) (pediatric): Secondary | ICD-10-CM | POA: Diagnosis present

## 2019-02-13 DIAGNOSIS — M1990 Unspecified osteoarthritis, unspecified site: Secondary | ICD-10-CM | POA: Diagnosis present

## 2019-02-13 DIAGNOSIS — F0391 Unspecified dementia with behavioral disturbance: Secondary | ICD-10-CM | POA: Diagnosis present

## 2019-02-13 DIAGNOSIS — Z87891 Personal history of nicotine dependence: Secondary | ICD-10-CM

## 2019-02-13 DIAGNOSIS — Z7401 Bed confinement status: Secondary | ICD-10-CM | POA: Diagnosis not present

## 2019-02-13 DIAGNOSIS — I639 Cerebral infarction, unspecified: Secondary | ICD-10-CM | POA: Diagnosis present

## 2019-02-13 DIAGNOSIS — E1151 Type 2 diabetes mellitus with diabetic peripheral angiopathy without gangrene: Secondary | ICD-10-CM | POA: Diagnosis present

## 2019-02-13 DIAGNOSIS — B9781 Human metapneumovirus as the cause of diseases classified elsewhere: Secondary | ICD-10-CM | POA: Diagnosis present

## 2019-02-13 DIAGNOSIS — R0602 Shortness of breath: Secondary | ICD-10-CM

## 2019-02-13 DIAGNOSIS — L8990 Pressure ulcer of unspecified site, unspecified stage: Secondary | ICD-10-CM

## 2019-02-13 DIAGNOSIS — R000 Tachycardia, unspecified: Secondary | ICD-10-CM | POA: Diagnosis not present

## 2019-02-13 DIAGNOSIS — R400 Somnolence: Secondary | ICD-10-CM

## 2019-02-13 DIAGNOSIS — Z833 Family history of diabetes mellitus: Secondary | ICD-10-CM

## 2019-02-13 DIAGNOSIS — Z7189 Other specified counseling: Secondary | ICD-10-CM | POA: Diagnosis not present

## 2019-02-13 DIAGNOSIS — R1310 Dysphagia, unspecified: Secondary | ICD-10-CM | POA: Diagnosis present

## 2019-02-13 DIAGNOSIS — K219 Gastro-esophageal reflux disease without esophagitis: Secondary | ICD-10-CM | POA: Diagnosis present

## 2019-02-13 DIAGNOSIS — Z515 Encounter for palliative care: Secondary | ICD-10-CM | POA: Diagnosis not present

## 2019-02-13 LAB — CBC WITH DIFFERENTIAL/PLATELET
Abs Immature Granulocytes: 0.1 10*3/uL — ABNORMAL HIGH (ref 0.00–0.07)
Basophils Absolute: 0.1 10*3/uL (ref 0.0–0.1)
Basophils Relative: 1 %
Eosinophils Absolute: 0.4 10*3/uL (ref 0.0–0.5)
Eosinophils Relative: 2 %
HCT: 37.5 % — ABNORMAL LOW (ref 39.0–52.0)
Hemoglobin: 12.2 g/dL — ABNORMAL LOW (ref 13.0–17.0)
Immature Granulocytes: 1 %
Lymphocytes Relative: 10 %
Lymphs Abs: 1.7 10*3/uL (ref 0.7–4.0)
MCH: 31.4 pg (ref 26.0–34.0)
MCHC: 32.5 g/dL (ref 30.0–36.0)
MCV: 96.6 fL (ref 80.0–100.0)
Monocytes Absolute: 1.1 10*3/uL — ABNORMAL HIGH (ref 0.1–1.0)
Monocytes Relative: 7 %
Neutro Abs: 12.9 10*3/uL — ABNORMAL HIGH (ref 1.7–7.7)
Neutrophils Relative %: 79 %
Platelets: 371 10*3/uL (ref 150–400)
RBC: 3.88 MIL/uL — ABNORMAL LOW (ref 4.22–5.81)
RDW: 13.3 % (ref 11.5–15.5)
WBC: 16.3 10*3/uL — ABNORMAL HIGH (ref 4.0–10.5)
nRBC: 0 % (ref 0.0–0.2)

## 2019-02-13 LAB — LACTIC ACID, PLASMA
Lactic Acid, Venous: 1.7 mmol/L (ref 0.5–1.9)
Lactic Acid, Venous: 1.2 mmol/L (ref 0.5–1.9)

## 2019-02-13 LAB — COMPREHENSIVE METABOLIC PANEL
ALT: 43 U/L (ref 0–44)
AST: 41 U/L (ref 15–41)
Albumin: 2.2 g/dL — ABNORMAL LOW (ref 3.5–5.0)
Alkaline Phosphatase: 108 U/L (ref 38–126)
Anion gap: 12 (ref 5–15)
BUN: 40 mg/dL — ABNORMAL HIGH (ref 8–23)
CO2: 23 mmol/L (ref 22–32)
Calcium: 9 mg/dL (ref 8.9–10.3)
Chloride: 114 mmol/L — ABNORMAL HIGH (ref 98–111)
Creatinine, Ser: 1.35 mg/dL — ABNORMAL HIGH (ref 0.61–1.24)
GFR calc Af Amer: 60 mL/min — ABNORMAL LOW (ref >60)
GFR calc non Af Amer: 52 mL/min — ABNORMAL LOW (ref >60)
Glucose, Bld: 323 mg/dL — ABNORMAL HIGH (ref 70–99)
Potassium: 3.8 mmol/L (ref 3.5–5.1)
Sodium: 149 mmol/L — ABNORMAL HIGH (ref 135–145)
Total Bilirubin: 0.8 mg/dL (ref 0.3–1.2)
Total Protein: 6.5 g/dL (ref 6.5–8.1)

## 2019-02-13 LAB — URINALYSIS, ROUTINE W REFLEX MICROSCOPIC
Glucose, UA: 250 mg/dL — AB
Ketones, ur: 15 mg/dL — AB
Leukocytes,Ua: NEGATIVE
Nitrite: NEGATIVE
Protein, ur: 100 mg/dL — AB
Specific Gravity, Urine: 1.025 (ref 1.005–1.030)
pH: 5 (ref 5.0–8.0)

## 2019-02-13 LAB — URINALYSIS, MICROSCOPIC (REFLEX)

## 2019-02-13 LAB — MRSA PCR SCREENING: MRSA by PCR: POSITIVE — AB

## 2019-02-13 LAB — GLUCOSE, CAPILLARY: Glucose-Capillary: 262 mg/dL — ABNORMAL HIGH (ref 70–99)

## 2019-02-13 LAB — TROPONIN I: Troponin I: <0.03 ng/mL (ref <0.03)

## 2019-02-13 MED ORDER — VANCOMYCIN HCL 10 G IV SOLR
1250.0000 mg | INTRAVENOUS | Status: DC
  Filled 2019-02-13 (×5): qty 1250

## 2019-02-13 MED ORDER — PANTOPRAZOLE SODIUM 40 MG PO TBEC
40.0000 mg | DELAYED_RELEASE_TABLET | Freq: Every day | ORAL | Status: DC
  Administered 2019-02-13 – 2019-02-14 (×2): 40 mg via ORAL
  Filled 2019-02-13 (×2): qty 1

## 2019-02-13 MED ORDER — INSULIN ASPART 100 UNIT/ML ~~LOC~~ SOLN
0.0000 [IU] | Freq: Three times a day (TID) | SUBCUTANEOUS | Status: DC
  Administered 2019-02-14 (×2): 2 [IU] via SUBCUTANEOUS
  Administered 2019-02-14 – 2019-02-15 (×3): 3 [IU] via SUBCUTANEOUS
  Administered 2019-02-15: 2 [IU] via SUBCUTANEOUS
  Administered 2019-02-16 (×2): 1 [IU] via SUBCUTANEOUS
  Administered 2019-02-16: 2 [IU] via SUBCUTANEOUS
  Administered 2019-02-17: 1 [IU] via SUBCUTANEOUS
  Administered 2019-02-17: 2 [IU] via SUBCUTANEOUS
  Administered 2019-02-17: 1 [IU] via SUBCUTANEOUS
  Administered 2019-02-18: 2 [IU] via SUBCUTANEOUS
  Administered 2019-02-18: 3 [IU] via SUBCUTANEOUS
  Administered 2019-02-18: 5 [IU] via SUBCUTANEOUS
  Administered 2019-02-19: 3 [IU] via SUBCUTANEOUS
  Administered 2019-02-19: 5 [IU] via SUBCUTANEOUS

## 2019-02-13 MED ORDER — VANCOMYCIN HCL IN DEXTROSE 1-5 GM/200ML-% IV SOLN
1000.0000 mg | Freq: Once | INTRAVENOUS | Status: DC
  Filled 2019-02-13: qty 200

## 2019-02-13 MED ORDER — ENOXAPARIN SODIUM 40 MG/0.4ML ~~LOC~~ SOLN
40.0000 mg | SUBCUTANEOUS | Status: DC
  Administered 2019-02-13 – 2019-02-23 (×11): 40 mg via SUBCUTANEOUS
  Filled 2019-02-13 (×12): qty 0.4

## 2019-02-13 MED ORDER — ESCITALOPRAM OXALATE 10 MG PO TABS
10.0000 mg | ORAL_TABLET | Freq: Every day | ORAL | Status: DC
  Administered 2019-02-13 – 2019-02-14 (×2): 10 mg via ORAL
  Filled 2019-02-13 (×2): qty 1

## 2019-02-13 MED ORDER — ONDANSETRON HCL 4 MG/2ML IJ SOLN: 4.0000 mg | Freq: Four times a day (QID) | INTRAMUSCULAR | Status: DC | PRN

## 2019-02-13 MED ORDER — INSULIN GLARGINE 100 UNIT/ML ~~LOC~~ SOLN
10.0000 [IU] | Freq: Two times a day (BID) | SUBCUTANEOUS | Status: DC
  Administered 2019-02-13 – 2019-02-18 (×10): 10 [IU] via SUBCUTANEOUS
  Filled 2019-02-13 (×17): qty 0.1

## 2019-02-13 MED ORDER — ACETAMINOPHEN 650 MG RE SUPP: 650.0000 mg | Freq: Four times a day (QID) | RECTAL | Status: DC | PRN

## 2019-02-13 MED ORDER — SODIUM CHLORIDE 0.9 % IV SOLN
2.0000 g | Freq: Once | INTRAVENOUS | Status: AC
  Administered 2019-02-13: 10:00:00 2 g via INTRAVENOUS
  Filled 2019-02-13: qty 2

## 2019-02-13 MED ORDER — CARVEDILOL 3.125 MG PO TABS
3.1250 mg | ORAL_TABLET | Freq: Two times a day (BID) | ORAL | Status: DC
  Administered 2019-02-14: 3.125 mg via ORAL
  Filled 2019-02-13: qty 1

## 2019-02-13 MED ORDER — RISPERIDONE 2 MG PO TABS
2.0000 mg | ORAL_TABLET | Freq: Every day | ORAL | Status: DC
  Administered 2019-02-13: 2 mg via ORAL
  Filled 2019-02-13: qty 1

## 2019-02-13 MED ORDER — SODIUM CHLORIDE 0.9 % IV SOLN
2.0000 g | INTRAVENOUS | Status: DC
  Filled 2019-02-13 (×3): qty 2

## 2019-02-13 MED ORDER — INSULIN ASPART 100 UNIT/ML ~~LOC~~ SOLN
0.0000 [IU] | Freq: Every day | SUBCUTANEOUS | Status: DC
  Administered 2019-02-13: 3 [IU] via SUBCUTANEOUS
  Administered 2019-02-14: 4 [IU] via SUBCUTANEOUS
  Administered 2019-02-16: 2 [IU] via SUBCUTANEOUS
  Administered 2019-02-18: 4 [IU] via SUBCUTANEOUS

## 2019-02-13 MED ORDER — SODIUM CHLORIDE 0.9 % IV BOLUS
1000.0000 mL | Freq: Once | INTRAVENOUS | Status: AC
  Administered 2019-02-13: 1000 mL via INTRAVENOUS

## 2019-02-13 MED ORDER — ACETAMINOPHEN 325 MG PO TABS
650.0000 mg | ORAL_TABLET | Freq: Four times a day (QID) | ORAL | Status: DC | PRN
  Administered 2019-02-21: 650 mg via ORAL
  Filled 2019-02-13: qty 2

## 2019-02-13 MED ORDER — ASPIRIN EC 81 MG PO TBEC
81.0000 mg | DELAYED_RELEASE_TABLET | Freq: Every day | ORAL | Status: DC
  Administered 2019-02-13 – 2019-02-24 (×10): 81 mg via ORAL
  Filled 2019-02-13 (×11): qty 1

## 2019-02-13 MED ORDER — VANCOMYCIN HCL 10 G IV SOLR
1500.0000 mg | Freq: Once | INTRAVENOUS | Status: AC
  Administered 2019-02-13: 1500 mg via INTRAVENOUS
  Filled 2019-02-13: qty 1500

## 2019-02-13 MED ORDER — PHENYTOIN SODIUM EXTENDED 100 MG PO CAPS
300.0000 mg | ORAL_CAPSULE | Freq: Every day | ORAL | Status: DC
  Administered 2019-02-13 – 2019-02-14 (×2): 300 mg via ORAL
  Filled 2019-02-13 (×2): qty 3

## 2019-02-13 MED ORDER — LEVETIRACETAM 500 MG PO TABS
500.0000 mg | ORAL_TABLET | Freq: Two times a day (BID) | ORAL | Status: DC
  Administered 2019-02-13 – 2019-02-14 (×2): 500 mg via ORAL
  Filled 2019-02-13 (×2): qty 1

## 2019-02-13 MED ORDER — ONDANSETRON HCL 4 MG PO TABS: 4.0000 mg | ORAL_TABLET | Freq: Four times a day (QID) | ORAL | Status: DC | PRN

## 2019-02-13 MED ORDER — SODIUM CHLORIDE 0.45 % IV SOLN
INTRAVENOUS | Status: DC
  Administered 2019-02-13 – 2019-02-23 (×15): via INTRAVENOUS

## 2019-02-13 MED ORDER — GALANTAMINE HYDROBROMIDE ER 8 MG PO CP24
16.0000 mg | ORAL_CAPSULE | Freq: Every day | ORAL | Status: DC
  Administered 2019-02-14: 16 mg via ORAL
  Filled 2019-02-13: qty 2

## 2019-02-13 NOTE — ED Provider Notes (Signed)
[facility] EMERGENCY DEPARTMENT Provider Note   CSN: [id number] Arrival date & time: 02/13/19  4782    History   Chief Complaint No chief complaint on file.   HPI Arthur Berry is a 73 y.o. male.     HPI  73yo male with history of dementia, DM, epilepsy, htn, hlpd, CKD, CVA who presents from [place] with concern for altered mental status, decreased appetite, generalized weakness. History is limited by patient's history of dementia.  Attempted to contact facility x2 however they are not available at this time.  In addition, EMS has limited history.  Per EMS, the staff couldn't take care of him because he was grunting at tech today. Reported they had difficulty getting him out of bed.  He did not want to eat this AM.   He was recently (maybe one week ago?) tested for COVID19 which was negative. He reportedly had bilateral pneumonia and was put on azithromycin but it is not clear if he is still taking it.  Per EMS, on their arrival, he had blood pressures in 80s, was tachypneic to 30s with ETO2 in 20s.  However, he became agitated in ambulance with them and his blood pressures increased and he began breathing more comfortably.  His saturations were 98% on room air. They got a temperature of 100.4.  They were not told about falls, did not report recent seizure-like activity.     Past Medical History:  Diagnosis Date  . Adjustment reaction with aggression 05/14/2017  . Arthritis   . Chronic kidney disease   . Colon polyp 2005   Tubular Adenoma  . Coronary artery disease, non-occlusive    Minimal nonobstructive CAD, nl LV systolic fxn by cath 12/11/12  . Diabetes mellitus 2001   TYPE 2  . Diverticulosis   . Epilepsy (HCC)   . GERD (gastroesophageal reflux disease) 04/23/2014  . Goiter, nontoxic, multinodular    Thyroid US 11/2012  . Hyperlipidemia 1999  . Hypertension 1999  . Internal hemorrhoids   . Neuromuscular disorder (HCC)   . OSA (obstructive sleep  apnea)   . Prostate enlargement   . Sleep apnea   . Splinter 04/27/13   metal plinter removed rt pointer finger  . Stroke (HCC) 06/2015  . Vision changes 06/2015   since CVA    Patient Active Problem List   Diagnosis Date Noted  . Acute metabolic encephalopathy 11/05/2018  . Hypernatremia 11/01/2018  . Status post total replacement of right hip 11/12/2017  . Closed right hip fracture, initial encounter (HCC) 10/23/2017  . Closed subcapital fracture of right femur (HCC)   . Adjustment reaction with aggression 05/14/2017  . Type 2 diabetes mellitus with other circulatory complications (HCC) 06/19/2015  . Thrombocytopenia (HCC) 06/19/2015  . Cerebral infarction due to unspecified mechanism   . Dilantin toxicity 06/18/2015  . Phenytoin toxicity   . Stroke (HCC) 06/16/2015  . Fall 06/16/2015  . Epilepsy (HCC) 06/16/2015  . BPH (benign prostatic hyperplasia) 06/16/2015  . Leukocytosis 06/16/2015  . CVA (cerebral infarction) 06/16/2015  . CVA (cerebral vascular accident) (HCC) 06/15/2015  . GERD (gastroesophageal reflux disease) 04/23/2014  . Potassium (K) deficiency 12/11/2012  . Precordial pain 12/11/2012  . Dehydration 04/15/2012  . AKI (acute kidney injury) (HCC) 04/14/2012  . HLD (hyperlipidemia) 06/01/2010  . Essential hypertension, benign 06/01/2010  . DYSPNEA 06/01/2010  . Diabetes mellitus without complication (HCC) 11/04/2009  . RECTAL BLEEDING 11/04/2009  . PERSONAL HX COLONIC POLYPS 11/04/2009    Past  Surgical History:  Procedure Laterality Date  . CHOLECYSTECTOMY    . COLONOSCOPY  12/28/2009  . ESOPHAGOGASTRODUODENOSCOPY  10/14/2012   normal   . INGUINAL HERNIA REPAIR  06/27/11   left  . KNEE SURGERY  2008   Left  . LEFT HEART CATHETERIZATION WITH CORONARY ANGIOGRAM N/A 12/11/2012   Procedure: LEFT HEART CATHETERIZATION WITH CORONARY ANGIOGRAM;  Surgeon: Tonny Bollman, MD;  Location: [facility] CATH LAB;  Service: Cardiovascular;  Laterality: N/A;  . TOTAL HIP  ARTHROPLASTY Right 10/23/2017   Procedure: TOTAL HIP ARTHROPLASTY ANTERIOR APPROACH;  Surgeon: Kathryne Hitch, MD;  Location: [facility] OR;  Service: Orthopedics;  Laterality: Right;        Home Medications    Prior to Admission medications   Medication Sig Start Date End Date Taking? Authorizing Provider  acetaminophen (TYLENOL) 500 MG tablet Take 500 mg by mouth every 4 (four) hours as needed for mild pain.    [provider]  alum & mag hydroxide-simeth (MAALOX/MYLANTA) 200-200-20 MG/5ML suspension Take 30 mLs by mouth every 6 (six) hours as needed for indigestion or heartburn.    [provider]  amLODipine (NORVASC) 2.5 MG tablet Take 2.5 mg by mouth daily.    [provider]  aspirin EC 81 MG tablet Take 81 mg by mouth daily.    [provider]  carvedilol (COREG) 3.125 MG tablet Take 3.125 mg by mouth 2 (two) times daily with a meal.    [provider]  Cholecalciferol (VITAMIN D3 PO) Take 2,000 Units by mouth at bedtime.     [provider]  escitalopram (LEXAPRO) 10 MG tablet Take 10 mg by mouth daily.    [provider]  galantamine (RAZADYNE ER) 16 MG 24 hr capsule Take 16 mg by mouth daily with breakfast.    [provider]  guaifenesin (ROBITUSSIN) 100 MG/5ML syrup Take 200 mg by mouth 4 (four) times daily as needed for cough.    [provider]  insulin aspart (NOVOLOG) 100 UNIT/ML injection Inject 0-15 Units into the skin 4 (four) times daily -  before meals and at bedtime. Mealtime sliding scale: Blood Glucose 121 - 150: 1 units  BG 151 - 200: 2 units  BG 201 - 250: 3 units  BG 251 - 300: 5 units  BG 301 - 350: 7 units  BG 351 - 400: 9 units   Bedtime sliding scale: BG 70 - 200: 0 units  BG 201 - 250: 2 units  BG 251 - 300: 3 units  BG 301 - 350: 4 units  BG 351 - 400: 5 units Patient taking differently: Inject 0-9 Units into the skin 4 (four) times daily -  before meals and at  bedtime.  11/07/18   Noralee Stain, DO  insulin glargine (LANTUS) 100 UNIT/ML injection Inject 0.15 mLs (15 Units total) into the skin at bedtime. Patient taking differently: Inject 15 Units into the skin 2 (two) times daily.  11/07/18 12/21/18  Noralee Stain, DO  levETIRAcetam (KEPPRA) 500 MG tablet Take 500 mg by mouth 2 (two) times daily.      [provider]  lisinopril (PRINIVIL,ZESTRIL) 20 MG tablet Take 20 mg by mouth daily.    [provider]  loperamide (IMODIUM) 2 MG capsule Take 2 mg by mouth every 4 (four) hours as needed for diarrhea or loose stools.    [provider]  LORazepam (ATIVAN) 0.5 MG tablet Take 0.5 tablets (0.25 mg total) by mouth at bedtime. Patient not  taking: Reported on 12/21/2018 11/07/18   Noralee Stain, DO  magnesium hydroxide (MILK OF MAGNESIA) 400 MG/5ML suspension Take 30 mLs by mouth at bedtime as needed for mild constipation.     [provider]  magnesium oxide (MAG-OX) 400 MG tablet Take 400 mg by mouth daily.    [provider]  Melatonin 3 MG TABS Take 6 mg by mouth at bedtime.     [provider]  metFORMIN (GLUCOPHAGE) 500 MG tablet Take 500 mg by mouth 2 (two) times daily.     [provider]  mirtazapine (REMERON) 30 MG tablet Take 30 mg by mouth at bedtime.     [provider]  neomycin-bacitracin-polymyxin (NEOSPORIN) ointment Apply 1 application topically daily as needed for wound care.    [provider]  pantoprazole (PROTONIX) 40 MG tablet Take 1 tablet (40 mg total) by mouth daily. 06/22/15   Catarina Hartshorn, MD  phenytoin (DILANTIN) 100 MG ER capsule Take 3 capsules (300 mg total) by mouth daily. 11/07/18   Noralee Stain, DO  risperiDONE (RISPERDAL) 2 MG tablet Take 2 mg by mouth at bedtime.     [provider]    Family History Family History  Problem Relation Age of Onset  . Heart disease Mother   . Alzheimer's disease Father   . Breast cancer Sister   .  Colon cancer Other        unknown  . Diabetes Sister     Social History Social History   Tobacco Use  . Smoking status: Former Smoker    Types: Cigars    Last attempt to quit: 04/14/1986    Years since quitting: 32.8  . Smokeless tobacco: Never Used  . Tobacco comment: Quit cigars in the 1980s  Substance Use Topics  . Alcohol use: No  . Drug use: No     Allergies   Patient has no known allergies.   Review of Systems Review of Systems  Unable to perform ROS: Dementia     Physical Exam Updated Vital Signs BP 132/67   Pulse 80   Temp 100.2 F (37.9 C) (Rectal)   Resp 15   Ht 5\' 9"  (1.753 m)   Wt 81 kg   SpO2 97%   BMI 26.37 kg/m   Physical Exam Vitals signs and nursing note reviewed.  Constitutional:      General: He is not in acute distress.    Appearance: He is well-developed. He is ill-appearing. He is not diaphoretic.     Comments: Appears generally weak Breathing through mouth, not spontaneously moving No respiratory distress   HENT:     Head: Normocephalic and atraumatic.     Mouth/Throat:     Mouth: Mucous membranes are dry.  Eyes:     Conjunctiva/sclera: Conjunctivae normal.  Neck:     Musculoskeletal: Normal range of motion.  Cardiovascular:     Rate and Rhythm: Normal rate and regular rhythm.  Pulmonary:     Effort: Pulmonary effort is normal. No respiratory distress.  Abdominal:     General: There is no distension.     Palpations: Abdomen is soft.     Tenderness: There is no abdominal tenderness. There is no guarding.  Skin:    General: Skin is warm and dry.  Neurological:     Mental Status: He is alert.     Comments: Says name Intermittently following commands, at one point squeezed hand on left, then on repeat does not follow command or withdraw  from pain Seen moving both lower extremities Per EMS had moved both in ambulance when agitated Pupils equal, appear to have normal movements, no fixed gaze, no facial droop       ED  Treatments / Results  Labs (all labs ordered are listed, but only abnormal results are displayed) Labs Reviewed  COMPREHENSIVE METABOLIC PANEL - Abnormal; Notable for the following components:      Result Value   Sodium 149 (*)    Chloride 114 (*)    Glucose, Bld 323 (*)    BUN 40 (*)    Creatinine, Ser 1.35 (*)    Albumin 2.2 (*)    GFR calc non Af Amer 52 (*)    GFR calc Af Amer 60 (*)    All other components within normal limits  CBC WITH DIFFERENTIAL/PLATELET - Abnormal; Notable for the following components:   WBC 16.3 (*)    RBC 3.88 (*)    Hemoglobin 12.2 (*)    HCT 37.5 (*)    Neutro Abs 12.9 (*)    Monocytes Absolute 1.1 (*)    Abs Immature Granulocytes 0.10 (*)    All other components within normal limits  URINALYSIS, ROUTINE W REFLEX MICROSCOPIC - Abnormal; Notable for the following components:   APPearance HAZY (*)    Glucose, UA 250 (*)    Hgb urine dipstick LARGE (*)    Bilirubin Urine SMALL (*)    Ketones, ur 15 (*)    Protein, ur 100 (*)    All other components within normal limits  URINALYSIS, MICROSCOPIC (REFLEX) - Abnormal; Notable for the following components:   Bacteria, UA FEW (*)    All other components within normal limits  CULTURE, BLOOD (ROUTINE X 2)  CULTURE, BLOOD (ROUTINE X 2)  URINE CULTURE  LACTIC ACID, PLASMA  TROPONIN I  LACTIC ACID, PLASMA    EKG EKG Interpretation  Date/Time:  Thursday February 13 2019 09:46:43 EDT Ventricular Rate:  89 PR Interval:    QRS Duration: 77 QT Interval:  391 QTC Calculation: 476 R Axis:   76 Text Interpretation:  Sinus rhythm Minimal ST depression, inferior leads Borderline prolonged QT interval Diffuse ST depression more exaggerated than prior Confirmed by Alvira Monday ([id number]) on 02/13/2019 9:57:59 AM   Radiology Ct Head Wo Contrast  Result Date: 02/13/2019 CLINICAL DATA:  AMS- unclear causeLess responsive, double pneumonia 1 week ago EXAM: CT HEAD WITHOUT CONTRAST TECHNIQUE: Contiguous axial  images were obtained from the base of the skull through the vertex without intravenous contrast. COMPARISON:  12/22/2027 and older exams. FINDINGS: Brain: There are no parenchymal masses, no evidence of a recent infarct and no intracranial hemorrhage. There are no extra-axial masses. Developmental anomalies including agenesis of the corpus callosum, large left sided interhemispheric cyst and ventriculomegaly are unchanged. White matter hypoattenuation consistent with chronic microvascular ischemic change is also stable. Vascular: No hyperdense vessel or unexpected calcification. Skull: Normal. Negative for fracture or focal lesion. Sinuses/Orbits: Globes and orbits are unremarkable. There is mucosal thickening with dependent fluid in the left maxillary sphenoid and left frontal sinuses and posterior ethmoid air cells. Additional mucosal thickening is noted along the remaining ethmoid air cells and in the posterior right maxillary sinus. Many of the left mastoid air cells are opacified with fluid attenuation. Clear right mastoid air cells. Other: None. IMPRESSION: 1. No acute intracranial abnormalities. 2. Chronic changes from developmental anomalies and white matter changes consistent with chronic microvascular ischemic change, stable from the prior exam. 3. No significant  sinus disease including air-fluid levels. Acute sinusitis should be considered in the proper clinical setting. Electronically Signed   By: Amie Portland M.D.   On: 02/13/2019 11:43   Dg Chest Port 1 View  Result Date: 02/13/2019 CLINICAL DATA:  Shortness of breath and fever. Altered mental status. EXAM: PORTABLE CHEST 1 VIEW COMPARISON:  11/01/2018 FINDINGS: Artifact overlies the chest. Heart size is normal. Mediastinal shadows show chronic mass-effect upon the trachea displacing it towards the right, most likely secondary to thyroid goiter. The lungs are clear by radiography. No consolidation or collapse. No effusions. IMPRESSION: No active  disease. Electronically Signed   By: Paulina Fusi M.D.   On: 02/13/2019 10:53    Procedures Procedures (including critical care time)  Medications Ordered in ED Medications  vancomycin (VANCOCIN) 1,500 mg in sodium chloride 0.9 % 500 mL IVPB (1,500 mg Intravenous New Bag/Given 02/13/19 1153)  ceFEPIme (MAXIPIME) 2 g in sodium chloride 0.9 % 100 mL IVPB (0 g Intravenous Stopped 02/13/19 1106)  sodium chloride 0.9 % bolus 1,000 mL (1,000 mLs Intravenous New Bag/Given 02/13/19 1006)     Initial Impression / Assessment and Plan / ED Course  I have reviewed the triage vital signs and the nursing notes.  Pertinent labs & imaging results that were available during my care of the patient were reviewed by me and considered in my medical decision making (see chart for details).        73yo male with history of dementia, DM, epilepsy, htn, hlpd, CKD, CVA who presents from [place] with concern for altered mental status, decreased appetite, generalized weakness.  Reportedly, recently diagnosed with bilateral pneumonia, on azithromycin, and had negative COVID19 testing.  EMS reported elevated temperature, decreased blood pressures, however on arrival to the ED his blood pressures have improved without intervention.  However, given history of altered mental status, concern for recent pneumonia and temperature, ordered blood dx, lactic acid, vanc/cefepime for suspected sepsis secondary to HCAP.  Given 1L NS as he appears dehydrated on exam.     It is unclear what his baseline is based on previous notes and my ability to get in touch with facility at this time.  Neuro exam limited but nonfocal. CT Head shows no acute findings.   Labs show leukocytosis. UA without infection. Lactate WNL. CMP shows dehydration with Na 149, Cr 1.35 from .8.  CXR without signs of infection.  Low suspicion for meningitis at this time.  Attempted to call [facility] 4 times, and family member, Jasmine December, once, but unable to  get in touch.  Will admit for dehydration, r/o sepsis given slight elevated temp and leukocytosis.      Final Clinical Impressions(s) / ED Diagnoses   Final diagnoses:  Generalized weakness  Dehydration  Acute kidney injury (HCC)  Leukocytosis, unspecified type    ED Discharge Orders    None       Alvira Monday, MD 02/13/19 1157

## 2019-02-13 NOTE — ED Notes (Signed)
This RN spoke with Dr. Jerral Ralph. Per MD patient is negative and does not need precautions for COVID rule out.

## 2019-02-13 NOTE — ED Notes (Signed)
ED TO INPATIENT HANDOFF REPORT  ED Nurse Name and Phone #:  Sabino Gasser [phone]  S Name/Age/Gender Arthur Berry 73 y.o. male Room/Bed: 031C/031C  Code Status   Code Status: Prior  Home/SNF/Other Nursing Home Patient oriented to: self Is this baseline? Yes  Triage Complete: Triage complete  Chief Complaint shortness of breath  Triage Note Pt coming from [facility]. Per EMS pt was grunting at aid at the facility and less responsive than normal. Pt was tested for COVID previously and it was negative. Pt was diagnosed with double pneumonia over a week ago. Pt has a past medical history of dementia. CBG of 389. 98% on room air. Per EMS pt was 80/56 upon initial assessment. Pt blood pressure was 140/70 when irritated. Pt alert and oriented to person only upon arrival to ED.    Allergies No Known Allergies  Level of Care/Admitting Diagnosis ED Disposition    ED Disposition Condition Comment   Admit  Hospital Area: [facility] [[id number]]  Level of Care: Telemetry Medical [104]  Diagnosis: Altered mental status [780.97.ICD-9-CM]  Admitting Physician: Dorcas Carrow [[id number]]  Attending Physician: Dorcas Carrow [[id number]]  Estimated length of stay: past midnight tomorrow  Certification:: I certify this patient will need inpatient services for at least 2 midnights  PT Class (Do Not Modify): Inpatient [101]  PT Acc Code (Do Not Modify): Private [1]       B Medical/Surgery History Past Medical History:  Diagnosis Date  . Adjustment reaction with aggression 05/14/2017  . Arthritis   . Chronic kidney disease   . Colon polyp 2005   Tubular Adenoma  . Coronary artery disease, non-occlusive    Minimal nonobstructive CAD, nl LV systolic fxn by cath 12/11/12  . Diabetes mellitus 2001   TYPE 2  . Diverticulosis   . Epilepsy (HCC)   . GERD (gastroesophageal reflux disease) 04/23/2014  . Goiter, nontoxic, multinodular    Thyroid US 11/2012  . Hyperlipidemia  1999  . Hypertension 1999  . Internal hemorrhoids   . Neuromuscular disorder (HCC)   . OSA (obstructive sleep apnea)   . Prostate enlargement   . Sleep apnea   . Splinter 04/27/13   metal plinter removed rt pointer finger  . Stroke (HCC) 06/2015  . Vision changes 06/2015   since CVA   Past Surgical History:  Procedure Laterality Date  . CHOLECYSTECTOMY    . COLONOSCOPY  12/28/2009  . ESOPHAGOGASTRODUODENOSCOPY  10/14/2012   normal   . INGUINAL HERNIA REPAIR  06/27/11   left  . KNEE SURGERY  2008   Left  . LEFT HEART CATHETERIZATION WITH CORONARY ANGIOGRAM N/A 12/11/2012   Procedure: LEFT HEART CATHETERIZATION WITH CORONARY ANGIOGRAM;  Surgeon: Tonny Bollman, MD;  Location: [facility] CATH LAB;  Service: Cardiovascular;  Laterality: N/A;  . TOTAL HIP ARTHROPLASTY Right 10/23/2017   Procedure: TOTAL HIP ARTHROPLASTY ANTERIOR APPROACH;  Surgeon: Kathryne Hitch, MD;  Location: [facility] OR;  Service: Orthopedics;  Laterality: Right;     A IV Location/Drains/Wounds Patient Lines/Drains/Airways Status   Active Line/Drains/Airways    Name:   Placement date:   Placement time:   Site:   Days:   Peripheral IV 02/13/19 Right Antecubital   02/13/19    1004    Antecubital   less than 1          Intake/Output Last 24 hours  Intake/Output Summary (Last 24 hours) at 02/13/2019 1248 Last data filed at 02/13/2019 1106 Gross per 24 hour  Intake  100 ml  Output -  Net 100 ml    Labs/Imaging Results for orders placed or performed during the hospital encounter of 02/13/19 (from the past 48 hour(s))  Lactic acid, plasma     Status: None   Collection Time: 02/13/19  9:58 AM  Result Value Ref Range   Lactic Acid, Venous 1.7 0.5 - 1.9 mmol/L    Comment: Performed at [facility], [address], [place], [place] [address]  Comprehensive metabolic panel     Status: Abnormal   Collection Time: 02/13/19  9:58 AM  Result Value Ref Range   Sodium 149 (H) 135 - 145 mmol/L   Potassium 3.8 3.5 - 5.1  mmol/L   Chloride 114 (H) 98 - 111 mmol/L   CO2 23 22 - 32 mmol/L   Glucose, Bld 323 (H) 70 - 99 mg/dL   BUN 40 (H) 8 - 23 mg/dL   Creatinine, Ser 6.04 (H) 0.61 - 1.24 mg/dL   Calcium 9.0 8.9 - 54.0 mg/dL   Total Protein 6.5 6.5 - 8.1 g/dL   Albumin 2.2 (L) 3.5 - 5.0 g/dL   AST 41 15 - 41 U/L   ALT 43 0 - 44 U/L   Alkaline Phosphatase 108 38 - 126 U/L   Total Bilirubin 0.8 0.3 - 1.2 mg/dL   GFR calc non Af Amer 52 (L) >60 mL/min   GFR calc Af Amer 60 (L) >60 mL/min   Anion gap 12 5 - 15    Comment: Performed at [facility], [address], [place], [place] [address]  CBC WITH DIFFERENTIAL     Status: Abnormal   Collection Time: 02/13/19  9:58 AM  Result Value Ref Range   WBC 16.3 (H) 4.0 - 10.5 K/uL   RBC 3.88 (L) 4.22 - 5.81 MIL/uL   Hemoglobin 12.2 (L) 13.0 - 17.0 g/dL   HCT 14.7 (L) 82.9 - 56.2 %   MCV 96.6 80.0 - 100.0 fL   MCH 31.4 26.0 - 34.0 pg   MCHC 32.5 30.0 - 36.0 g/dL   RDW 13.0 86.5 - 78.4 %   Platelets 371 150 - 400 K/uL    Comment: REPEATED TO VERIFY   nRBC 0.0 0.0 - 0.2 %   Neutrophils Relative % 79 %   Neutro Abs 12.9 (H) 1.7 - 7.7 K/uL   Lymphocytes Relative 10 %   Lymphs Abs 1.7 0.7 - 4.0 K/uL   Monocytes Relative 7 %   Monocytes Absolute 1.1 (H) 0.1 - 1.0 K/uL   Eosinophils Relative 2 %   Eosinophils Absolute 0.4 0.0 - 0.5 K/uL   Basophils Relative 1 %   Basophils Absolute 0.1 0.0 - 0.1 K/uL   Immature Granulocytes 1 %   Abs Immature Granulocytes 0.10 (H) 0.00 - 0.07 K/uL    Comment: Performed at [facility], [address] [place], [place] [address]  Troponin I - ONCE - STAT     Status: None   Collection Time: 02/13/19  9:58 AM  Result Value Ref Range   Troponin I <0.03 <0.03 ng/mL    Comment: Performed at [facility], [address] [place], [place] [address]  Urinalysis, Routine w reflex microscopic     Status: Abnormal   Collection Time: 02/13/19 10:27 AM  Result Value Ref Range   Color, Urine YELLOW YELLOW    APPearance HAZY (A) CLEAR   Specific Gravity, Urine 1.025 1.005 - 1.030   pH 5.0 5.0 -  8.0   Glucose, UA 250 (A) NEGATIVE mg/dL   Hgb urine dipstick LARGE (A) NEGATIVE   Bilirubin Urine SMALL (A) NEGATIVE   Ketones, ur 15 (A) NEGATIVE mg/dL   Protein, ur 132 (A) NEGATIVE mg/dL   Nitrite NEGATIVE NEGATIVE   Leukocytes,Ua NEGATIVE NEGATIVE    Comment: Performed at [facility], [address] [place], [place] [address]  Urinalysis, Microscopic (reflex)     Status: Abnormal   Collection Time: 02/13/19 10:27 AM  Result Value Ref Range   RBC / HPF 0-5 0 - 5 RBC/hpf   WBC, UA 0-5 0 - 5 WBC/hpf   Bacteria, UA FEW (A) NONE SEEN   Squamous Epithelial / LPF 0-5 0 - 5   Mucus PRESENT    Granular Casts, UA PRESENT     Comment: Performed at [facility], [address], [place], [place] [address]   Ct Head Wo Contrast  Result Date: 02/13/2019 CLINICAL DATA:  AMS- unclear causeLess responsive, double pneumonia 1 week ago EXAM: CT HEAD WITHOUT CONTRAST TECHNIQUE: Contiguous axial images were obtained from the base of the skull through the vertex without intravenous contrast. COMPARISON:  12/22/2027 and older exams. FINDINGS: Brain: There are no parenchymal masses, no evidence of a recent infarct and no intracranial hemorrhage. There are no extra-axial masses. Developmental anomalies including agenesis of the corpus callosum, large left sided interhemispheric cyst and ventriculomegaly are unchanged. White matter hypoattenuation consistent with chronic microvascular ischemic change is also stable. Vascular: No hyperdense vessel or unexpected calcification. Skull: Normal. Negative for fracture or focal lesion. Sinuses/Orbits: Globes and orbits are unremarkable. There is mucosal thickening with dependent fluid in the left maxillary sphenoid and left frontal sinuses and posterior ethmoid air cells. Additional mucosal thickening is noted along the remaining ethmoid air cells and in the posterior right  maxillary sinus. Many of the left mastoid air cells are opacified with fluid attenuation. Clear right mastoid air cells. Other: None. IMPRESSION: 1. No acute intracranial abnormalities. 2. Chronic changes from developmental anomalies and white matter changes consistent with chronic microvascular ischemic change, stable from the prior exam. 3. No significant sinus disease including air-fluid levels. Acute sinusitis should be considered in the proper clinical setting. Electronically Signed   By: Amie Portland M.D.   On: 02/13/2019 11:43   Dg Chest Port 1 View  Result Date: 02/13/2019 CLINICAL DATA:  Shortness of breath and fever. Altered mental status. EXAM: PORTABLE CHEST 1 VIEW COMPARISON:  11/01/2018 FINDINGS: Artifact overlies the chest. Heart size is normal. Mediastinal shadows show chronic mass-effect upon the trachea displacing it towards the right, most likely secondary to thyroid goiter. The lungs are clear by radiography. No consolidation or collapse. No effusions. IMPRESSION: No active disease. Electronically Signed   By: Paulina Fusi M.D.   On: 02/13/2019 10:53    Pending Labs Unresulted Labs (From admission, onward)    Start     Ordered   02/13/19 0955  Lactic acid, plasma  Now then every 2 hours,   STAT     02/13/19 0956   02/13/19 0955  Blood Culture (routine x 2)  BLOOD CULTURE X 2,   STAT     02/13/19 0956   02/13/19 0955  Urine culture  ONCE - STAT,   STAT     02/13/19 0956   Signed and Held  Basic metabolic panel  Tomorrow morning,   R     Signed and Held   Signed and Held  CBC  Tomorrow morning,   R     Signed and Held          Vitals/Pain Today's Vitals   02/13/19 1145 02/13/19 1200 02/13/19 1215 02/13/19 1230  BP: 132/67 134/63 140/80 139/64  Pulse: 80 83 88 79  Resp: Temp:      TempSrc:      SpO2: 97% 96% 96% 97%  Weight:      Height:        Isolation Precautions No active isolations  Medications Medications  vancomycin (VANCOCIN) 1,500 mg  in sodium chloride 0.9 % 500 mL IVPB (1,500 mg Intravenous New Bag/Given 02/13/19 1153)  ceFEPIme (MAXIPIME) 2 g in sodium chloride 0.9 % 100 mL IVPB (has no administration in time range)  vancomycin (VANCOCIN) 1,250 mg in sodium chloride 0.9 % 250 mL IVPB (has no administration in time range)  0.45 % sodium chloride infusion (has no administration in time range)  ceFEPIme (MAXIPIME) 2 g in sodium chloride 0.9 % 100 mL IVPB (0 g Intravenous Stopped 02/13/19 1106)  sodium chloride 0.9 % bolus 1,000 mL (1,000 mLs Intravenous New Bag/Given 02/13/19 1006)    Mobility non-ambulatory High fall risk   Focused Assessments Neuro Assessment Handoff:  Swallow screen pass? No Cardiac Rhythm: Normal sinus rhythm       Neuro Assessment: Within Defined Limits Neuro Checks:      Last Documented NIHSS Modified Score:   Has TPA been given? No If patient is a Neuro Trauma and patient is going to OR before floor call report to 4N Charge nurse: [phone] or [phone]     R Recommendations: See Admitting Provider Note  Report given to:   Additional Notes:  Pt with dementia at baseline. Wheelchair at baseline.

## 2019-02-13 NOTE — ED Triage Notes (Signed)
Pt coming from [facility]. Per EMS pt was grunting at aid at the facility and less responsive than normal. Pt was tested for COVID previously and it was negative. Pt was diagnosed with double pneumonia over a week ago. Pt has a past medical history of dementia. CBG of 389. 98% on room air. Per EMS pt was 80/56 upon initial assessment. Pt blood pressure was 140/70 when irritated. Pt alert and oriented to person only upon arrival to ED.

## 2019-02-13 NOTE — Progress Notes (Signed)
This patient from nursing home with no other source of infection.  Presenting with altered mental status.  I tried to contact nursing home multiple times, I could not verify that patient had a negative COVID-19. Given high incidence of current COVID-19 cases, after much discussion it was decided that we will send his COVID-19 test. Patient is currently on room air, he will be on contact and droplet precautions.

## 2019-02-13 NOTE — Progress Notes (Signed)
Pharmacy Antibiotic Note  Arthur Berry is a 73 y.o. male admitted on 02/13/2019 with pneumonia.  Pharmacy has been consulted for vancomycin and cefepime dosing. Pt is febrile with Tmax 100.2 and WBC is elevated at 16.3. Scr is also above baseline at 1.35.   Plan: Vancomycin 1500mg  IV x 1 then 1250mg  IV Q24H Cefepime 2gm IV Q24H F/u renal fxn, C&S, clinical status and peak/trough at SS  Height: 5\' 9"  (175.3 cm) Weight: 178 lb 9.2 oz (81 kg) IBW/kg (Calculated) : 70.7  Temp (24hrs), Avg:100.2 F (37.9 C), Min:100.2 F (37.9 C), Max:100.2 F (37.9 C)  Recent Labs  Lab 02/13/19 0958  WBC 16.3*  CREATININE 1.35*  LATICACIDVEN 1.7    Estimated Creatinine Clearance: 48.7 mL/min (A) (by C-G formula based on SCr of 1.35 mg/dL (H)).    No Known Allergies  Antimicrobials this admission: Vanc 4/2>> Cefepime 4/2>>  Dose adjustments this admission: N/A  Microbiology results: Pending  Thank you for allowing pharmacy to be a part of this patient's care.  Jaquin Coy, Drake Leach 02/13/2019 10:00 AM

## 2019-02-13 NOTE — H&P (Addendum)
History and Physical    KAELIN BONELLI ZOX:[id number] DOB: 05/07/1946 DOA: 02/13/2019  PCP: Zeb Comfort  Patient coming from: nursing home   I have personally briefly reviewed patient's old medical records available.   Chief Complaint: altered mental status   HPI: KANO HECKMANN is a 73 y.o. male with medical history significant of type 2 diabetes on insulin and metformin, dementia, long-term nursing home resident, hypertension, history of seizure who is brought to the emergency room with altered mental status.  Patient is currently altered and unable to provide any history.  Discussed with patient's brother who had seen him 3 weeks ago before stay home order and had no complaints.  At baseline, patient is total dependent and is risk of fall on attempted ambulation.  He has some behavior disturbances due to cognitive dysfunction as per his brother. No nursing home records are sent with patient. I called [facility]eb Comfort, had to leave voice message to the provider. EMS was called with concern about more lethargic, decreased appetite and generalized weakness.  He was reportedly more agitated at the nursing home and also agitated at ambulance as per EMS staff. EMS reported that his blood pressure was low on the field, however his blood pressures has been stable now. ED Course: Blood pressures are stable.  Patient is on room air.  WBC count is 16.3.  Creatinine 1.35.  His sodium is also 149.  Blood sugars are 323.  Chest x-ray is essentially normal.  Reportedly he was treated with azithromycin.  EMS was told that patient had a negative COVID-19 test within a week and was treated for pneumonia with azithromycin.  We will try to connect to nursing home again.  His brother was not aware.  Review of Systems: Unable.  Patient is altered.   Past Medical History:  Diagnosis Date  . Adjustment reaction with aggression 05/14/2017  . Arthritis   . Chronic kidney disease   . Colon polyp 2005   Tubular  Adenoma  . Coronary artery disease, non-occlusive    Minimal nonobstructive CAD, nl LV systolic fxn by cath 12/11/12  . Diabetes mellitus 2001   TYPE 2  . Diverticulosis   . Epilepsy (HCC)   . GERD (gastroesophageal reflux disease) 04/23/2014  . Goiter, nontoxic, multinodular    Thyroid US 11/2012  . Hyperlipidemia 1999  . Hypertension 1999  . Internal hemorrhoids   . Neuromuscular disorder (HCC)   . OSA (obstructive sleep apnea)   . Prostate enlargement   . Sleep apnea   . Splinter 04/27/13   metal plinter removed rt pointer finger  . Stroke (HCC) 06/2015  . Vision changes 06/2015   since CVA    Past Surgical History:  Procedure Laterality Date  . CHOLECYSTECTOMY    . COLONOSCOPY  12/28/2009  . ESOPHAGOGASTRODUODENOSCOPY  10/14/2012   normal   . INGUINAL HERNIA REPAIR  06/27/11   left  . KNEE SURGERY  2008   Left  . LEFT HEART CATHETERIZATION WITH CORONARY ANGIOGRAM N/A 12/11/2012   Procedure: LEFT HEART CATHETERIZATION WITH CORONARY ANGIOGRAM;  Surgeon: Tonny Bollman, MD;  Location: [facility] CATH LAB;  Service: Cardiovascular;  Laterality: N/A;  . TOTAL HIP ARTHROPLASTY Right 10/23/2017   Procedure: TOTAL HIP ARTHROPLASTY ANTERIOR APPROACH;  Surgeon: Kathryne Hitch, MD;  Location: [facility] OR;  Service: Orthopedics;  Laterality: Right;     reports that he quit smoking about 32 years ago. His smoking use included cigars. He has never used smokeless tobacco. He reports that  he does not drink alcohol or use drugs.  No Known Allergies  Family History  Problem Relation Age of Onset  . Heart disease Mother   . Alzheimer's disease Father   . Breast cancer Sister   . Colon cancer Other        unknown  . Diabetes Sister      Prior to Admission medications   Medication Sig Start Date End Date Taking? Authorizing Provider  acetaminophen (TYLENOL) 500 MG tablet Take 500 mg by mouth every 4 (four) hours as needed for mild pain.    [provider]  alum & mag  hydroxide-simeth (MAALOX/MYLANTA) 200-200-20 MG/5ML suspension Take 30 mLs by mouth every 6 (six) hours as needed for indigestion or heartburn.    [provider]  amLODipine (NORVASC) 2.5 MG tablet Take 2.5 mg by mouth daily.    [provider]  aspirin EC 81 MG tablet Take 81 mg by mouth daily.    [provider]  carvedilol (COREG) 3.125 MG tablet Take 3.125 mg by mouth 2 (two) times daily with a meal.    [provider]  Cholecalciferol (VITAMIN D3 PO) Take 2,000 Units by mouth at bedtime.     [provider]  escitalopram (LEXAPRO) 10 MG tablet Take 10 mg by mouth daily.    [provider]  galantamine (RAZADYNE ER) 16 MG 24 hr capsule Take 16 mg by mouth daily with breakfast.    [provider]  guaifenesin (ROBITUSSIN) 100 MG/5ML syrup Take 200 mg by mouth 4 (four) times daily as needed for cough.    [provider]  insulin aspart (NOVOLOG) 100 UNIT/ML injection Inject 0-15 Units into the skin 4 (four) times daily -  before meals and at bedtime. Mealtime sliding scale: Blood Glucose 121 - 150: 1 units  BG 151 - 200: 2 units  BG 201 - 250: 3 units  BG 251 - 300: 5 units  BG 301 - 350: 7 units  BG 351 - 400: 9 units   Bedtime sliding scale: BG 70 - 200: 0 units  BG 201 - 250: 2 units  BG 251 - 300: 3 units  BG 301 - 350: 4 units  BG 351 - 400: 5 units Patient taking differently: Inject 0-9 Units into the skin 4 (four) times daily -  before meals and at bedtime.  11/07/18   Noralee Stain, DO  insulin glargine (LANTUS) 100 UNIT/ML injection Inject 0.15 mLs (15 Units total) into the skin at bedtime. Patient taking differently: Inject 15 Units into the skin 2 (two) times daily.  11/07/18 12/21/18  Noralee Stain, DO  levETIRAcetam (KEPPRA) 500 MG tablet Take 500 mg by mouth 2 (two) times daily.      [provider]  lisinopril (PRINIVIL,ZESTRIL) 20 MG tablet Take 20 mg by mouth daily.    [provider]  loperamide (IMODIUM) 2 MG capsule Take 2 mg by mouth every 4 (four) hours as needed for diarrhea or loose stools.    [provider]  LORazepam (ATIVAN) 0.5 MG tablet Take 0.5 tablets (0.25 mg total) by mouth at bedtime. Patient not taking: Reported on 12/21/2018 11/07/18   Noralee Stain, DO  magnesium hydroxide (MILK OF MAGNESIA) 400 MG/5ML suspension Take 30 mLs by mouth at bedtime as needed for mild constipation.     [provider]  magnesium oxide (MAG-OX) 400 MG tablet Take 400 mg by mouth daily.    [provider]  Melatonin 3 MG  TABS Take 6 mg by mouth at bedtime.     [provider]  metFORMIN (GLUCOPHAGE) 500 MG tablet Take 500 mg by mouth 2 (two) times daily.     [provider]  mirtazapine (REMERON) 30 MG tablet Take 30 mg by mouth at bedtime.     [provider]  neomycin-bacitracin-polymyxin (NEOSPORIN) ointment Apply 1 application topically daily as needed for wound care.    [provider]  pantoprazole (PROTONIX) 40 MG tablet Take 1 tablet (40 mg total) by mouth daily. 06/22/15   Catarina Hartshornat, David, MD  phenytoin (DILANTIN) 100 MG ER capsule Take 3 capsules (300 mg total) by mouth daily. 11/07/18   Noralee Stainhoi, Jennifer, DO  risperiDONE (RISPERDAL) 2 MG tablet Take 2 mg by mouth at bedtime.     [provider]    Physical Exam: Vitals:   02/13/19 1145 02/13/19 1200 02/13/19 1215 02/13/19 1230  BP: 132/67 134/63 140/80 139/64  Pulse: 80 83 88 79  Resp: 15 15 20 19   Temp:      TempSrc:      SpO2: 97% 96% 96% 97%  Weight:      Height:        Constitutional: NAD, calm, comfortable Vitals:   02/13/19 1145 02/13/19 1200 02/13/19 1215 02/13/19 1230  BP: 132/67 134/63 140/80 139/64  Pulse: 80 83 88 79  Resp: 15 15 20 19   Temp:      TempSrc:      SpO2: 97% 96% 96% 97%  Weight:      Height:       Eyes: PERRL, lids and conjunctivae normal ENMT: Mucous membranes are dry. Posterior pharynx clear of any exudate or  lesions.Normal dentition.  Neck: normal, supple, no masses, no thyromegaly Respiratory: clear to auscultation bilaterally, no wheezing, no crackles. Normal respiratory effort. No accessory muscle use.   Patient is on room air with no respiratory distress. Cardiovascular: Regular rate and rhythm, no murmurs / rubs / gallops. No extremity edema. 2+ pedal pulses. No carotid bruits.  Tachycardic. Abdomen: no tenderness, no masses palpated. No hepatosplenomegaly. Bowel sounds positive.  Musculoskeletal: no clubbing / cyanosis. No joint deformity upper and lower extremities. Good ROM, no contractures. Normal muscle tone.  Skin: no rashes, lesions, ulcers. No induration Neurologic: Patient is lethargic.  He is able to follow some commands on repeated stimulation.  Unintelligible speech. Psychiatric: Lethargic with flat affect.    Labs on Admission: I have personally reviewed following labs and imaging studies  CBC: Recent Labs  Lab 02/13/19 0958  WBC 16.3*  NEUTROABS 12.9*  HGB 12.2*  HCT 37.5*  MCV 96.6  PLT 371   Basic Metabolic Panel: Recent Labs  Lab 02/13/19 0958  NA 149*  K 3.8  CL 114*  CO2 23  GLUCOSE 323*  BUN 40*  CREATININE 1.35*  CALCIUM 9.0   GFR: Estimated Creatinine Clearance: 48.7 mL/min (A) (by C-G formula based on SCr of 1.35 mg/dL (H)). Liver Function Tests: Recent Labs  Lab 02/13/19 0958  AST 41  ALT 43  ALKPHOS 108  BILITOT 0.8  PROT 6.5  ALBUMIN 2.2*   No results for input(s): LIPASE, AMYLASE in the last 168 hours. No results for input(s): AMMONIA in the last 168 hours. Coagulation Profile: No results for input(s): INR, PROTIME in the last 168 hours. Cardiac Enzymes: Recent Labs  Lab 02/13/19 0958  TROPONINI <0.03   BNP (last 3 results) No results for input(s): PROBNP in the last 8760 hours. HbA1C: No results  for input(s): HGBA1C in the last 72 hours. CBG: No results for input(s): GLUCAP in the last 168 hours. Lipid Profile: No  results for input(s): CHOL, HDL, LDLCALC, TRIG, CHOLHDL, LDLDIRECT in the last 72 hours. Thyroid Function Tests: No results for input(s): TSH, T4TOTAL, FREET4, T3FREE, THYROIDAB in the last 72 hours. Anemia Panel: No results for input(s): VITAMINB12, FOLATE, FERRITIN, TIBC, IRON, RETICCTPCT in the last 72 hours. Urine analysis:    Component Value Date/Time   COLORURINE YELLOW 02/13/2019 1027   APPEARANCEUR HAZY (A) 02/13/2019 1027   LABSPEC 1.025 02/13/2019 1027   PHURINE 5.0 02/13/2019 1027   GLUCOSEU 250 (A) 02/13/2019 1027   HGBUR LARGE (A) 02/13/2019 1027   BILIRUBINUR SMALL (A) 02/13/2019 1027   KETONESUR 15 (A) 02/13/2019 1027   PROTEINUR 100 (A) 02/13/2019 1027   UROBILINOGEN 0.2 06/15/2015 2200   NITRITE NEGATIVE 02/13/2019 1027   LEUKOCYTESUR NEGATIVE 02/13/2019 1027    Radiological Exams on Admission: Ct Head Wo Contrast  Result Date: 02/13/2019 CLINICAL DATA:  AMS- unclear causeLess responsive, double pneumonia 1 week ago EXAM: CT HEAD WITHOUT CONTRAST TECHNIQUE: Contiguous axial images were obtained from the base of the skull through the vertex without intravenous contrast. COMPARISON:  12/22/2027 and older exams. FINDINGS: Brain: There are no parenchymal masses, no evidence of a recent infarct and no intracranial hemorrhage. There are no extra-axial masses. Developmental anomalies including agenesis of the corpus callosum, large left sided interhemispheric cyst and ventriculomegaly are unchanged. White matter hypoattenuation consistent with chronic microvascular ischemic change is also stable. Vascular: No hyperdense vessel or unexpected calcification. Skull: Normal. Negative for fracture or focal lesion. Sinuses/Orbits: Globes and orbits are unremarkable. There is mucosal thickening with dependent fluid in the left maxillary sphenoid and left frontal sinuses and posterior ethmoid air cells. Additional mucosal thickening is noted along the remaining ethmoid air cells and in the  posterior right maxillary sinus. Many of the left mastoid air cells are opacified with fluid attenuation. Clear right mastoid air cells. Other: None. IMPRESSION: 1. No acute intracranial abnormalities. 2. Chronic changes from developmental anomalies and white matter changes consistent with chronic microvascular ischemic change, stable from the prior exam. 3. No significant sinus disease including air-fluid levels. Acute sinusitis should be considered in the proper clinical setting. Electronically Signed   By: Amie Portland M.D.   On: 02/13/2019 11:43   Dg Chest Port 1 View  Result Date: 02/13/2019 CLINICAL DATA:  Shortness of breath and fever. Altered mental status. EXAM: PORTABLE CHEST 1 VIEW COMPARISON:  11/01/2018 FINDINGS: Artifact overlies the chest. Heart size is normal. Mediastinal shadows show chronic mass-effect upon the trachea displacing it towards the right, most likely secondary to thyroid goiter. The lungs are clear by radiography. No consolidation or collapse. No effusions. IMPRESSION: No active disease. Electronically Signed   By: Paulina Fusi M.D.   On: 02/13/2019 10:53    EKG: Independently reviewed.  Normal sinus rhythm.  Inferior leads had some ST depression but nonspecific.  Assessment/Plan Principal Problem:   Sepsis (HCC) Active Problems:   Diabetes mellitus without complication (HCC)   HLD (hyperlipidemia)   Essential hypertension, benign   GERD (gastroesophageal reflux disease)   CVA (cerebral vascular accident) (HCC)   Epilepsy (HCC)   Acute metabolic encephalopathy   Dehydration with hypernatremia   Altered mental status     1.  Sepsis: Present on admission.  Primary source unknown.  Acute infective encephalopathy. Chest x-ray not very impressive patient does not have much pulmonary symptoms.  Urinalysis  is normal.  Due to severity of presentation, will treat him as sepsis with unknown source. Patient received vancomycin and cefepime in the ER that he should  continue until clinical improvement or culture reports available.  I was reported that COVID-19 test was negative within a week, will not repeat.  Patient is on room air with not much pulmonary symptoms.     2.  Hypernatremia with dehydration: This is probably due to poor oral intake and free water deficit.  Patient was treated with normal saline.  Will change to half-normal saline and continue.  Recheck levels in the morning.  3.  Type 2 diabetes with uncontrolled blood sugars: Currently unreliable oral intake.  Will reduce the dose of long-acting insulin and keep on sliding scale insulin.  Hopefully with fluid resuscitation his blood sugars will improve.  4.  Dementia with history of a stroke and seizure: CT head with no new findings.  Less likely new stroke.  Continue to monitor with every 4 hours neuro checks. Patient is on Keppra and phenytoin for seizure and no reported seizures recently.  5.  Hypertension: Patient is at risk of hypotension.  Will hold antihypertensives.  Patient has significant disease on presentation, sepsis, dehydration with hypernatremia.  He will need inpatient treatment management and titration of medications and IV antibiotics.  Anticipate hospitalization more than 2 midnights.  Patient will be started on diet and medications.  Supervised feeding.  DVT prophylaxis: Lovenox Code Status: Full code. Addendum, 1320  PM  Able to find patients DNR paper which was sent from nursing home.  Changed to DNR. Family Communication: Patient's brother Nicholson Horne was called and updated about plan of care.  He stated that he probably has healthcare power of attorney papers.  He is mostly making patient's healthcare decisions. Disposition Plan: Long-term care Consults called: None Admission status: Inpatient.   Dorcas Carrow MD [facility] Pager [phone]  If 7PM-7AM, please contact night-coverage [email or web address] Password [facility]  02/13/2019, 12:42 PM   I could   Not talk to provider at nursing home on repeat attempts.

## 2019-02-14 DIAGNOSIS — A419 Sepsis, unspecified organism: Secondary | ICD-10-CM

## 2019-02-14 DIAGNOSIS — L89229 Pressure ulcer of left hip, unspecified stage: Secondary | ICD-10-CM

## 2019-02-14 DIAGNOSIS — L8990 Pressure ulcer of unspecified site, unspecified stage: Secondary | ICD-10-CM

## 2019-02-14 DIAGNOSIS — I10 Essential (primary) hypertension: Secondary | ICD-10-CM

## 2019-02-14 LAB — RESPIRATORY PANEL BY PCR

## 2019-02-14 LAB — CBC
HCT: 32.6 % — ABNORMAL LOW (ref 39.0–52.0)
Hemoglobin: 10.3 g/dL — ABNORMAL LOW (ref 13.0–17.0)
MCH: 30.2 pg (ref 26.0–34.0)
MCHC: 31.6 g/dL (ref 30.0–36.0)
MCV: 95.6 fL (ref 80.0–100.0)
Platelets: 307 10*3/uL (ref 150–400)
RBC: 3.41 MIL/uL — ABNORMAL LOW (ref 4.22–5.81)
RDW: 13.4 % (ref 11.5–15.5)
WBC: 13.6 10*3/uL — ABNORMAL HIGH (ref 4.0–10.5)
nRBC: 0 % (ref 0.0–0.2)

## 2019-02-14 LAB — BASIC METABOLIC PANEL
Anion gap: 9 (ref 5–15)
BUN: 30 mg/dL — ABNORMAL HIGH (ref 8–23)
CO2: 23 mmol/L (ref 22–32)
Calcium: 8.4 mg/dL — ABNORMAL LOW (ref 8.9–10.3)
Chloride: 116 mmol/L — ABNORMAL HIGH (ref 98–111)
Creatinine, Ser: 0.91 mg/dL (ref 0.61–1.24)
GFR calc Af Amer: >60 mL/min (ref >60)
GFR calc non Af Amer: >60 mL/min (ref >60)
Glucose, Bld: 229 mg/dL — ABNORMAL HIGH (ref 70–99)
Potassium: 3.2 mmol/L — ABNORMAL LOW (ref 3.5–5.1)
Sodium: 148 mmol/L — ABNORMAL HIGH (ref 135–145)

## 2019-02-14 LAB — URINE CULTURE: Culture: NO GROWTH

## 2019-02-14 LAB — GLUCOSE, CAPILLARY
Glucose-Capillary: 233 mg/dL — ABNORMAL HIGH (ref 70–99)
Glucose-Capillary: 206 mg/dL — ABNORMAL HIGH (ref 70–99)
Glucose-Capillary: 197 mg/dL — ABNORMAL HIGH (ref 70–99)
Glucose-Capillary: 173 mg/dL — ABNORMAL HIGH (ref 70–99)
Glucose-Capillary: 327 mg/dL — ABNORMAL HIGH (ref 70–99)

## 2019-02-14 MED ORDER — SODIUM CHLORIDE 0.9 % IV SOLN
150.0000 mg | Freq: Two times a day (BID) | INTRAVENOUS | Status: DC
  Administered 2019-02-14 – 2019-02-15 (×2): 150 mg via INTRAVENOUS
  Filled 2019-02-14 (×4): qty 3

## 2019-02-14 MED ORDER — COLLAGENASE 250 UNIT/GM EX OINT
TOPICAL_OINTMENT | Freq: Every day | CUTANEOUS | Status: DC
  Administered 2019-02-14 – 2019-02-15 (×2): 1 via TOPICAL
  Administered 2019-02-16 – 2019-02-21 (×6): via TOPICAL
  Administered 2019-02-22 – 2019-02-23 (×2): 1 via TOPICAL
  Administered 2019-02-24: 11:00:00 via TOPICAL
  Filled 2019-02-14 (×2): qty 30

## 2019-02-14 MED ORDER — PANTOPRAZOLE SODIUM 40 MG IV SOLR
40.0000 mg | INTRAVENOUS | Status: DC
  Administered 2019-02-15 – 2019-02-19 (×5): 40 mg via INTRAVENOUS
  Filled 2019-02-14 (×5): qty 40

## 2019-02-14 MED ORDER — MUPIROCIN 2 % EX OINT
1.0000 "application " | TOPICAL_OINTMENT | Freq: Two times a day (BID) | CUTANEOUS | Status: AC
  Administered 2019-02-14 – 2019-02-18 (×10): 1 via NASAL
  Filled 2019-02-14 (×4): qty 22

## 2019-02-14 MED ORDER — METOPROLOL TARTRATE 5 MG/5ML IV SOLN
2.5000 mg | Freq: Four times a day (QID) | INTRAVENOUS | Status: DC
  Administered 2019-02-14 – 2019-02-18 (×14): 2.5 mg via INTRAVENOUS
  Filled 2019-02-14 (×16): qty 5

## 2019-02-14 MED ORDER — SODIUM CHLORIDE 0.9 % IV SOLN
2.0000 g | Freq: Two times a day (BID) | INTRAVENOUS | Status: DC
  Administered 2019-02-14 – 2019-02-18 (×8): 2 g via INTRAVENOUS
  Filled 2019-02-14 (×12): qty 2

## 2019-02-14 MED ORDER — LEVETIRACETAM IN NACL 500 MG/100ML IV SOLN
500.0000 mg | Freq: Two times a day (BID) | INTRAVENOUS | Status: DC
  Administered 2019-02-14 – 2019-02-18 (×9): 500 mg via INTRAVENOUS
  Filled 2019-02-14 (×10): qty 100

## 2019-02-14 MED ORDER — POTASSIUM CHLORIDE 10 MEQ/100ML IV SOLN
10.0000 meq | INTRAVENOUS | Status: AC
  Administered 2019-02-14 (×5): 10 meq via INTRAVENOUS
  Filled 2019-02-14 (×5): qty 100

## 2019-02-14 MED ORDER — VANCOMYCIN HCL 1000 MG IV SOLR
1000.0000 mg | Freq: Two times a day (BID) | INTRAVENOUS | Status: DC
  Administered 2019-02-14 – 2019-02-16 (×6): 1000 mg via INTRAVENOUS
  Filled 2019-02-14 (×8): qty 1000

## 2019-02-14 MED ORDER — CHLORHEXIDINE GLUCONATE CLOTH 2 % EX PADS
6.0000 | MEDICATED_PAD | Freq: Every day | CUTANEOUS | Status: AC
  Administered 2019-02-14 – 2019-02-18 (×4): 6 via TOPICAL

## 2019-02-14 MED ORDER — HALOPERIDOL LACTATE 5 MG/ML IJ SOLN
2.0000 mg | Freq: Four times a day (QID) | INTRAMUSCULAR | Status: DC | PRN
  Administered 2019-02-17 – 2019-02-21 (×2): 2 mg via INTRAVENOUS
  Filled 2019-02-14 (×2): qty 1

## 2019-02-14 NOTE — Progress Notes (Addendum)
PROGRESS NOTE    Arthur Berry  ZOX:[id number] DOB: 1945-11-22 DOA: 02/13/2019 PCP: Arthur Berry   Brief Narrative:  Per admitting MD Arthur Berry is a 73 y.o. male with medical history significant of type 2 diabetes on insulin and metformin, dementia, long-term nursing home resident, hypertension, history of seizure who is brought to the emergency room with altered mental status.  Patient is currently altered and unable to provide any history.  Discussed with patient's brother who had seen him 3 weeks ago before stay home order and had no complaints.  At baseline, patient is total dependent and is risk of fall on attempted ambulation.  He has some behavior disturbances due to cognitive dysfunction as per his brother. No nursing home records are sent with patient. [facility], had to leave voice message to the provider. EMS was called with concern about more lethargic, decreased appetite and generalized weakness.  He was reportedly more agitated at the nursing home and also agitated at ambulance as per EMS staff. EMS reported that his blood pressure was low on the field, however his blood pressures has been stable now. ED Course: Blood pressures are stable.  Patient is on room air.  WBC count is 16.3.  Creatinine 1.35.  His sodium is also 149.  Blood sugars are 323.  Chest x-ray is essentially normal.  Reportedly he was treated with azithromycin.  EMS was told that patient had a negative COVID-19 test within a week and was treated for pneumonia with azithromycin.  We will try to connect to nursing home again.  His brother was not aware.   Assessment & Plan:   Principal Problem:   Sepsis (HCC) Active Problems:   Diabetes mellitus without complication (HCC)   HLD (hyperlipidemia)   Essential hypertension, benign   GERD (gastroesophageal reflux disease)   CVA (cerebral vascular accident) (HCC)   Epilepsy (HCC)   Acute metabolic encephalopathy   Dehydration with  hypernatremia   Altered mental status   Pressure injury of skin   Sepsis: Present on admission.  Primary source unknown.  Acute infective encephalopathy. As noted by admitting physician, chest x-ray not very impressive patient does not have much pulmonary symptoms.  Urinalysis is normal.  Due to severity of presentation, will continue to treat him as sepsis with unknown source. Patient received vancomycin and cefepime in the ER, I will also continue until clinical improvement or culture reports available.  Possible COVID.  Admitting physician was unable to ascertain if patient was truly negative, pathway initiated will also add respiratory virus panel to eval  Hypernatremia with dehydration: I agree, this is probably due to poor oral intake and free water deficit.  Patient was treated with normal saline initially in the emergency room this was changed to half-normal saline which I will continue.  Recheck levels in the morning.  Type 2 diabetes with uncontrolled blood sugars: Currently unreliable oral intake.  Will reduce the dose of long-acting insulin and keep on sliding scale insulin.  Hopefully with fluid resuscitation his blood sugars will improve.  Dementia with history of a stroke and seizure: CT head with no new findings.  Less likely new stroke.  Continue to monitor with every 4 hours neuro checks. Patient is on Keppra and phenytoin for seizure and no reported seizures recently.  Hypertension: Initially blood pressure meds were held secondary concerns for risk of sepsis and hypotension.  Patient blood pressure is increasing.  I will add IV medications for now secondary to nursing  concern and family concern of impaired swallowing and risk of aspiration.  DysphaGia..  Will ask for speech for consult on swallow eval.  Keep n.p.o. for now, evaluate meds for conversion IV medications  Skin pressure injury left hip.  Wound care consult placed obtaining imaging for their evaluation and  recommendations    Patient has significant disease on presentation, sepsis, dehydration with hypernatremia.  He will need inpatient treatment management and titration of medications and IV antibiotics.  Anticipate hospitalization more than 2 midnights.    DVT prophylaxis: Lovenox SQ  Code Status: DNR    Code Status Orders  (From admission, onward)         Start     Ordered   02/13/19 1956  Full code  Continuous     02/13/19 1955        Code Status History    Date Active Date Inactive Code Status Order ID Comments User Context   02/13/2019 1318 02/13/2019 1955 DNR [id number]  Arthur Carrow, MD ED   11/01/2018 1637 11/07/2018 2104 Full Code [id number]  Lanae Boast, MD Inpatient   10/23/2017 0522 10/27/2017 1905 Full Code [id number]  Eduard Clos, MD ED   05/13/2017 2249 05/14/2017 1701 Full Code [id number]  Arthor Captain, PA-C ED   06/16/2015 0024 06/22/2015 1439 Full Code [id number]  Lorretta Harp, MD ED    Advance Directive Documentation     Most Recent Value  Type of Advance Directive  Out of facility DNR (pink MOST or yellow form)  Pre-existing out of facility DNR order (yellow form or pink MOST form)  --  "MOST" Form in Place?  --     Family Communication: LM with Arthur Berry, brother Disposition Plan:   Patient haD significant disease on presentation, sepsis, dehydration with hypernatremia.  He will continue to need inpatient treatment management and titration of medications and IV antibiotics with frequent monitoring of electrolytes, supplemental O2,.   Consults called: None Admission status: Inpatient   Consultants:   None  Procedures:  Ct Head Wo Contrast  Result Date: 02/13/2019 CLINICAL DATA:  AMS- unclear causeLess responsive, double pneumonia 1 week ago EXAM: CT HEAD WITHOUT CONTRAST TECHNIQUE: Contiguous axial images were obtained from the base of the skull through the vertex without intravenous contrast. COMPARISON:  12/22/2027 and older exams. FINDINGS: Brain:  There are no parenchymal masses, no evidence of a recent infarct and no intracranial hemorrhage. There are no extra-axial masses. Developmental anomalies including agenesis of the corpus callosum, large left sided interhemispheric cyst and ventriculomegaly are unchanged. White matter hypoattenuation consistent with chronic microvascular ischemic change is also stable. Vascular: No hyperdense vessel or unexpected calcification. Skull: Normal. Negative for fracture or focal lesion. Sinuses/Orbits: Globes and orbits are unremarkable. There is mucosal thickening with dependent fluid in the left maxillary sphenoid and left frontal sinuses and posterior ethmoid air cells. Additional mucosal thickening is noted along the remaining ethmoid air cells and in the posterior right maxillary sinus. Many of the left mastoid air cells are opacified with fluid attenuation. Clear right mastoid air cells. Other: None. IMPRESSION: 1. No acute intracranial abnormalities. 2. Chronic changes from developmental anomalies and white matter changes consistent with chronic microvascular ischemic change, stable from the prior exam. 3. No significant sinus disease including air-fluid levels. Acute sinusitis should be considered in the proper clinical setting. Electronically Signed   By: Amie Portland M.D.   On: 02/13/2019 11:43   Dg Chest Port 1 View  Result Date: 02/13/2019 CLINICAL DATA:  Shortness  of breath and fever. Altered mental status. EXAM: PORTABLE CHEST 1 VIEW COMPARISON:  11/01/2018 FINDINGS: Artifact overlies the chest. Heart size is normal. Mediastinal shadows show chronic mass-effect upon the trachea displacing it towards the right, most likely secondary to thyroid goiter. The lungs are clear by radiography. No consolidation or collapse. No effusions. IMPRESSION: No active disease. Electronically Signed   By: Paulina Fusi M.D.   On: 02/13/2019 10:53     Antimicrobials:   Vancomycin and cefepime day  #2   Subjective: Patient minimally interactive, answer questions when asked although has obvious cognitive deficits at baseline  Objective: Vitals:   02/13/19 2330 02/14/19 0300 02/14/19 0600 02/14/19 0847  BP: (!) 146/71 132/68 (!) 144/80 134/78  Pulse: 80 77 75 68  Resp: Temp: 98.9 F (37.2 C) 98 F (36.7 C) 98.1 F (36.7 C) 97.7 F (36.5 C)  TempSrc: Oral Oral Oral Oral  SpO2: 100% 100% 98% 98%  Weight:   78.2 kg   Height:        Intake/Output Summary (Last 24 hours) at 02/14/2019 1031 Last data filed at 02/14/2019 0600 Gross per 24 hour  Intake 927.17 ml  Output 350 ml  Net 577.17 ml   Filed Weights   02/13/19 1400 02/13/19 2040 02/14/19 0600  Weight: 81 kg 76.6 kg 78.2 kg    Examination:  General exam: Appears calm and comfortable  Respiratory system: Clear to auscultation. Respiratory effort normal. Cardiovascular system: Trace rhonchi normal respiratory rate  gastrointestinal system: Abdomen is nondistended, soft and nontender. No organomegaly or masses felt. Normal bowel sounds heard. Central nervous system: Alert, unable to answer orientation questions. No focal neurological deficits. Extremities: Symmetric 5 x 5 power.  No obvious contractures Skin: Black eschar left hip no draining Psychiatry: Judgement and insight are limited secondary to known cognitive deficits and dementia. Mood & affect are flat    Data Reviewed: I have personally reviewed following labs and imaging studies  CBC: Recent Labs  Lab 02/13/19 0958 02/14/19 0659  WBC 16.3* 13.6*  NEUTROABS 12.9*  --   HGB 12.2* 10.3*  HCT 37.5* 32.6*  MCV 96.6 95.6  PLT 371 307   Basic Metabolic Panel: Recent Labs  Lab 02/13/19 0958 02/14/19 0659  NA 149* 148*  K 3.8 3.2*  CL 114* 116*  CO2 23 23  GLUCOSE 323* 229*  BUN 40* 30*  CREATININE 1.35* 0.91  CALCIUM 9.0 8.4*   GFR: Estimated Creatinine Clearance: 72.3 mL/min (by C-G formula based on SCr of 0.91 mg/dL). Liver  Function Tests: Recent Labs  Lab 02/13/19 0958  AST 41  ALT 43  ALKPHOS 108  BILITOT 0.8  PROT 6.5  ALBUMIN 2.2*   No results for input(s): LIPASE, AMYLASE in the last 168 hours. No results for input(s): AMMONIA in the last 168 hours. Coagulation Profile: No results for input(s): INR, PROTIME in the last 168 hours. Cardiac Enzymes: Recent Labs  Lab 02/13/19 0958  TROPONINI <0.03   BNP (last 3 results) No results for input(s): PROBNP in the last 8760 hours. HbA1C: No results for input(s): HGBA1C in the last 72 hours. CBG: Recent Labs  Lab 02/13/19 2149 02/14/19 0912  GLUCAP 262* 206*   Lipid Profile: No results for input(s): CHOL, HDL, LDLCALC, TRIG, CHOLHDL, LDLDIRECT in the last 72 hours. Thyroid Function Tests: No results for input(s): TSH, T4TOTAL, FREET4, T3FREE, THYROIDAB in the last 72 hours. Anemia Panel: No results for input(s): VITAMINB12, FOLATE, FERRITIN, TIBC, IRON, RETICCTPCT  in the last 72 hours. Sepsis Labs: Recent Labs  Lab 02/13/19 0958 02/13/19 1445  LATICACIDVEN 1.7 1.2    Recent Results (from the past 240 hour(s))  Urine culture     Status: None   Collection Time: 02/13/19 10:30 AM  Result Value Ref Range Status   Specimen Description URINE, CLEAN CATCH  Final   Special Requests NONE  Final   Culture   Final    NO GROWTH Performed at [facility], [address] [place], [place] [address]    Report Status 02/14/2019 FINAL  Final  MRSA PCR Screening     Status: Abnormal   Collection Time: 02/13/19  8:55 PM  Result Value Ref Range Status   MRSA by PCR POSITIVE (A) NEGATIVE Final    Comment:        The GeneXpert MRSA Assay (FDA approved for NASAL specimens only), is one component of a comprehensive MRSA colonization surveillance program. It is not intended to diagnose MRSA infection nor to guide or monitor treatment for MRSA infections. RESULT CALLED TO, READ BACK BY AND VERIFIED WITHGavin Potters RN 02/13/19 2337  JDW Performed at [facility], [address], [place], [place] [address]          Radiology Studies: Ct Head Wo Contrast  Result Date: 02/13/2019 CLINICAL DATA:  AMS- unclear causeLess responsive, double pneumonia 1 week ago EXAM: CT HEAD WITHOUT CONTRAST TECHNIQUE: Contiguous axial images were obtained from the base of the skull through the vertex without intravenous contrast. COMPARISON:  12/22/2027 and older exams. FINDINGS: Brain: There are no parenchymal masses, no evidence of a recent infarct and no intracranial hemorrhage. There are no extra-axial masses. Developmental anomalies including agenesis of the corpus callosum, large left sided interhemispheric cyst and ventriculomegaly are unchanged. White matter hypoattenuation consistent with chronic microvascular ischemic change is also stable. Vascular: No hyperdense vessel or unexpected calcification. Skull: Normal. Negative for fracture or focal lesion. Sinuses/Orbits: Globes and orbits are unremarkable. There is mucosal thickening with dependent fluid in the left maxillary sphenoid and left frontal sinuses and posterior ethmoid air cells. Additional mucosal thickening is noted along the remaining ethmoid air cells and in the posterior right maxillary sinus. Many of the left mastoid air cells are opacified with fluid attenuation. Clear right mastoid air cells. Other: None. IMPRESSION: 1. No acute intracranial abnormalities. 2. Chronic changes from developmental anomalies and white matter changes consistent with chronic microvascular ischemic change, stable from the prior exam. 3. No significant sinus disease including air-fluid levels. Acute sinusitis should be considered in the proper clinical setting. Electronically Signed   By: Amie Portland M.D.   On: 02/13/2019 11:43   Dg Chest Port 1 View  Result Date: 02/13/2019 CLINICAL DATA:  Shortness of breath and fever. Altered mental status. EXAM: PORTABLE CHEST 1 VIEW COMPARISON:  11/01/2018  FINDINGS: Artifact overlies the chest. Heart size is normal. Mediastinal shadows show chronic mass-effect upon the trachea displacing it towards the right, most likely secondary to thyroid goiter. The lungs are clear by radiography. No consolidation or collapse. No effusions. IMPRESSION: No active disease. Electronically Signed   By: Paulina Fusi M.D.   On: 02/13/2019 10:53        Scheduled Meds:  aspirin EC  81 mg Oral Daily   carvedilol  3.125 mg Oral BID WC   Chlorhexidine Gluconate Cloth  6 each Topical Q0600   enoxaparin (LOVENOX) injection  40 mg Subcutaneous Q24H   escitalopram  10 mg  Oral Daily   galantamine  16 mg Oral Q breakfast   insulin aspart  0-5 Units Subcutaneous QHS   insulin aspart  0-9 Units Subcutaneous TID WC   insulin glargine  10 Units Subcutaneous BID   levETIRAcetam  500 mg Oral BID   mupirocin ointment  1 application Nasal BID   pantoprazole  40 mg Oral Daily   phenytoin  300 mg Oral Daily   risperiDONE  2 mg Oral QHS   Continuous Infusions:  sodium chloride 125 mL/hr at 02/14/19 0214   ceFEPime (MAXIPIME) IV 2 g (02/14/19 0949)   vancomycin       LOS: 1 day    Time spent: 35 min    Burke Keels, MD [facility]  If 7PM-7AM, please contact night-coverage  02/14/2019, 10:31 AM

## 2019-02-14 NOTE — Evaluation (Signed)
Clinical/Bedside Swallow Evaluation Patient Details  Name: Arthur Berry MRN: [id number] Date of Birth: 1945/12/04  Today's Date: 02/14/2019 Time: SLP Start Time (ACUTE ONLY): 1520 SLP Stop Time (ACUTE ONLY): 1537 SLP Time Calculation (min) (ACUTE ONLY): 17 min  Past Medical History:  Past Medical History:  Diagnosis Date  . Adjustment reaction with aggression 05/14/2017  . Arthritis   . Chronic kidney disease   . Colon polyp 2005   Tubular Adenoma  . Coronary artery disease, non-occlusive    Minimal nonobstructive CAD, nl LV systolic fxn by cath 12/11/12  . Diabetes mellitus 2001   TYPE 2  . Diverticulosis   . Epilepsy (HCC)   . GERD (gastroesophageal reflux disease) 04/23/2014  . Goiter, nontoxic, multinodular    Thyroid US 11/2012  . Hyperlipidemia 1999  . Hypertension 1999  . Internal hemorrhoids   . Neuromuscular disorder (HCC)   . OSA (obstructive sleep apnea)   . Prostate enlargement   . Sleep apnea   . Splinter 04/27/13   metal plinter removed rt pointer finger  . Stroke (HCC) 06/2015  . Vision changes 06/2015   since CVA   Past Surgical History:  Past Surgical History:  Procedure Laterality Date  . CHOLECYSTECTOMY    . COLONOSCOPY  12/28/2009  . ESOPHAGOGASTRODUODENOSCOPY  10/14/2012   normal   . INGUINAL HERNIA REPAIR  06/27/11   left  . KNEE SURGERY  2008   Left  . LEFT HEART CATHETERIZATION WITH CORONARY ANGIOGRAM N/A 12/11/2012   Procedure: LEFT HEART CATHETERIZATION WITH CORONARY ANGIOGRAM;  Surgeon: Tonny Bollman, MD;  Location: [facility] CATH LAB;  Service: Cardiovascular;  Laterality: N/A;  . TOTAL HIP ARTHROPLASTY Right 10/23/2017   Procedure: TOTAL HIP ARTHROPLASTY ANTERIOR APPROACH;  Surgeon: Kathryne Hitch, MD;  Location: [facility] OR;  Service: Orthopedics;  Laterality: Right;   HPI:  Arthur Berry is a 73 y.o. male with medical history significant of type 2 diabetes on insulin and metformin, dementia, long-term nursing home resident, hypertension, history  of seizure who is brought to the emergency room with altered mental status. Found to have Sepsis, reportedly pt had negative COVID-19 test within a week, but no documentation. Current test pending. Reportedly concern from RN and family regarding pts swallowing, But SIL reports pt was able to eat and drink well, but needed asssit with self feeding at facility, was on finger foods. Rn reprots difficulty feeding pt due to oral holding and anterior spillage. Prior SLP notes from 10/2018 reports recent fall with mild head injury, recommended to consume regular/thin diet, swallow appeared [facility].    Assessment / Plan / Recommendation Clinical Impression  Pt demonstrates cognitive impairment impacting awareness and strength for PO intake. He is unable to self feed and with max assist and cueing he does not make appropraite labial seal to cup or straw. Sips with a cup enter his mouth passively and resulted in explosive coughing. Pt much more responsive to spoon feeding and slow oral transit is compensated for with thicker liquids. Pt best tolerated honey thick liquids and puree via spoon with minimal spillage and no coughing. Pills to be given crushed in puree. Informed RN and posted sign. Recommed f/u as needed for diet advancement as pts mentation improves per RN report.  SLP Visit Diagnosis: Dysphagia, oropharyngeal phase (R13.12)    Aspiration Risk  Moderate aspiration risk    Diet Recommendation Dysphagia 1 (Puree);Honey-thick liquid   Liquid Administration via: Spoon Medication Administration: Crushed with puree Supervision: Full supervision/cueing for compensatory strategies  Compensations: Slow rate;Small sips/bites Postural Changes: Seated upright at 90 degrees    Other  Recommendations Oral Care Recommendations: Oral care BID Other Recommendations: Order thickener from pharmacy   Follow up Recommendations Skilled Nursing facility      Frequency and Duration min 1 x/week  2 weeks        Prognosis        Swallow Study   General HPI: Arthur Berry is a 73 y.o. male with medical history significant of type 2 diabetes on insulin and metformin, dementia, long-term nursing home resident, hypertension, history of seizure who is brought to the emergency room with altered mental status. Found to have Sepsis, reportedly pt had negative COVID-19 test within a week, but no documentation. Current test pending. Reportedly concern from RN and family regarding pts swallowing, But SIL reports pt was able to eat and drink well, but needed asssit with self feeding at facility, was on finger foods. Rn reprots difficulty feeding pt due to oral holding and anterior spillage. Prior SLP notes from 10/2018 reports recent fall with mild head injury, recommended to consume regular/thin diet, swallow appeared [facility].  Type of Study: Bedside Swallow Evaluation Previous Swallow Assessment: see HPI Diet Prior to this Study: NPO History of Recent Intubation: No Oral Cavity Assessment: Dried secretions;Dry Oral Care Completed by SLP: No Oral Cavity - Dentition: Adequate natural dentition Self-Feeding Abilities: Total assist Patient Positioning: Postural control interferes with function Baseline Vocal Quality: Low vocal intensity Volitional Cough: Cognitively unable to elicit Volitional Swallow: Unable to elicit    Oral/Motor/Sensory Function Overall Oral Motor/Sensory Function: Generalized oral weakness   Ice Chips     Thin Liquid Thin Liquid: Impaired Presentation: Cup Oral Phase Impairments: Reduced labial seal Oral Phase Functional Implications: Right anterior spillage;Prolonged oral transit Pharyngeal  Phase Impairments: Cough - Immediate    Nectar Thick Nectar Thick Liquid: Impaired Presentation: Spoon Oral Phase Impairments: Reduced labial seal Oral phase functional implications: Right anterior spillage;Prolonged oral transit Pharyngeal Phase Impairments: Suspected delayed Swallow   Honey  Thick Honey Thick Liquid: Impaired Presentation: Spoon Oral Phase Functional Implications: Right anterior spillage   Puree Puree: Within functional limits Presentation: Spoon   Solid     Solid: Not tested     Harlon Ditty, MA CCC-SLP  Acute Rehabilitation Services Pager [phone] Office [phone]  Claudine Mouton 02/14/2019,3:52 PM

## 2019-02-14 NOTE — Progress Notes (Signed)
Initial Nutrition Assessment  DOCUMENTATION CODES:   Not applicable  INTERVENTION:   -RD will follow for diet advancement and supplement as appropriate  NUTRITION DIAGNOSIS:   Increased nutrient needs related to wound healing as evidenced by estimated needs.  GOAL:   Patient will meet greater than or equal to 90% of their needs  MONITOR:   Diet advancement, PO intake, Supplement acceptance, Labs, Weight trends, Skin, I & O's  REASON FOR ASSESSMENT:   Low Braden    ASSESSMENT:   Arthur Berry a 73 y.o.malewith medical history significant oftype 2 diabetes on insulin and metformin, dementia, long-term nursing home resident, hypertension, history of seizure who is brought to the emergency room with altered mental status. Patient is currently altered and unable to provide any history. Discussed with patient's brother who had seen him 3 weeks ago before stay home order and had no complaints. At baseline, patient is total dependent and is risk of fall on attempted ambulation. He has some behavior disturbances due to cognitive dysfunction as per his brother.  Pt admitted with sepsis.   Pt being tested to rule out COVID-19.  PTA, pt resided at [facility]. Pt unable to provide history secondary to dementia.   Reviewed wt hx; noted 3.4% wt loss in the past month, which while not significant for time frame, however, is concerning given increased needs for pressure injuries.   Per MD notes, family concerned about impaired swallowing. Pt currently NPO and awaiting swallow evaluation.   Lab Results  Component Value Date   HGBA1C 9.2 (H) 11/01/2018   PTA DM medications are 500 mg metformin BID,  0-15 units insulin aspart QID and 15 units insulin glargine q HS.   Labs reviewed: Na: 148, K: 3.2, CBGS: 197-262 (inpatient orders for glycemic control are 0-5 units insulin aspart q HS, 0-9 units insulin aspart TID with meals, and 10 units insulin glargine BID).    NUTRITION - FOCUSED PHYSICAL EXAM:    Most Recent Value  Orbital Region  Unable to assess  Upper Arm Region  Unable to assess  Thoracic and Lumbar Region  Unable to assess  Buccal Region  Unable to assess  Temple Region  Unable to assess  Clavicle Bone Region  Unable to assess  Clavicle and Acromion Bone Region  Unable to assess  Scapular Bone Region  Unable to assess  Dorsal Hand  Unable to assess  Patellar Region  Unable to assess  Anterior Thigh Region  Unable to assess  Posterior Calf Region  Unable to assess  Edema (RD Assessment)  Unable to assess  Hair  Unable to assess  Eyes  Unable to assess  Mouth  Unable to assess  Skin  Unable to assess  Nails  Unable to assess       Diet Order:   Diet Order            Diet NPO time specified  Diet effective now              EDUCATION NEEDS:   Not appropriate for education at this time  Skin:  Skin Assessment: Skin Integrity Issues: Skin Integrity Issues:: Unstageable Unstageable: lt hip  Last BM:  Unknown  Height:   Ht Readings from Last 1 Encounters:  02/13/19 5\' 9"  (1.753 m)    Weight:   Wt Readings from Last 1 Encounters:  02/14/19 78.2 kg    Ideal Body Weight:  72.7 kg  BMI:  Body mass index is 25.46 kg/m.  Estimated Nutritional Needs:   Kcal:  2100-2300  Protein:  110-125 grams  Fluid:  > 2.1 L    Legion Discher A. Mayford Knife, RD, LDN, CDCES Registered Dietitian II Certified Diabetes Care and Education Specialist Pager: [phone] After hours Pager: [phone]

## 2019-02-14 NOTE — Consult Note (Addendum)
WOC Nurse wound consult note Reason for Consult:Unstageable Pressure Injury  Wound type: Unstageable Pressure Injury POA: Yes Measurement:5cm x 5cm area of erythema with deeper area in center, depth unable to be determined due to the presence  of necrotic slough Wound bed:As described above Drainage (amount, consistency, odor) small serous to light yellow Periwound:Intact with mild erythema. Dressing procedure/placement/frequency:   I have communicated with Dr. Lurene Shadow this morning and he will be adding a photo to his note. Upon receipt, I will provide guidance via the Orders for topical care.  Gershon Cull, the patient's Bedside RN, reports that the black eschar in center is firmly adherent and surrounding area is without fluctuance. The periwound tissue is with mild warmth and firmness (induration). I have provided guidance via the Orders for a mattress replacement for pressure redistribution, instruction to avoid positioning on the left side as able and topical care guidance for collagenase ointment (an enzymatic debriding agent) topped with NS dampened gauze and a dry dressing and changed daily.   If further consultation is desired or for wound debridement, recommend consultation with CCS/Surgery.   WOC nursing team will not follow, but will remain available to this patient, the nursing and medical teams.  Please re-consult if needed. Thanks, Ladona Mow, MSN, RN, GNP, [place]  Pager# [phone]

## 2019-02-14 NOTE — Progress Notes (Signed)
Pharmacy Antibiotic Note  Arthur Berry is a 73 y.o. male admitted on 02/13/2019 with pneumonia.  Pharmacy has been consulted for vancomycin and cefepime dosing - day #2. Pt is afebrile and WBC trend down. Will adjust dosing due to improved renal function - SCr down to 0.91.  Plan: Adjust vancomycin to 1000 mg IV Q12H. Goal AUC 400-550. Expected AUC: 483 SCr used: 0.91 Adjust cefepime 2gm IV Q12H F/u renal fxn, C&S, clinical status, vancomycin levels as indicated  Height: 5\' 9"  (175.3 cm) Weight: 172 lb 6.4 oz (78.2 kg) IBW/kg (Calculated) : 70.7  Temp (24hrs), Avg:98.7 F (37.1 C), Min:98 F (36.7 C), Max:100.2 F (37.9 C)  Recent Labs  Lab 02/13/19 0958 02/13/19 1445 02/14/19 0659  WBC 16.3*  --  13.6*  CREATININE 1.35*  --  0.91  LATICACIDVEN 1.7 1.2  --     Estimated Creatinine Clearance: 72.3 mL/min (by C-G formula based on SCr of 0.91 mg/dL).    No Known Allergies  Antimicrobials this admission: Vanc 4/2>> Cefepime 4/2>>  Dose adjustments this admission: 4/3 cefepime to 2g IV q12 4/3 vanc to 1g IV q12h  Microbiology results: 4/2 mrsa pcr + 4/2 bcx - 4/2 UC - neg 4/2 covid - ip  Babs Bertin, PharmD, BCPS Please check AMION for all [facility] contact numbers Clinical Pharmacist 02/14/2019 8:56 AM

## 2019-02-14 NOTE — NC FL2 (Signed)
[place] MEDICAID FL2 LEVEL OF CARE SCREENING TOOL     IDENTIFICATION  Patient Name: Arthur Berry Birthdate: 04-Feb-1946 Sex: male Admission Date (Current Location): 02/13/2019  [facility] [address] and [place] Number:  Producer, television/film/video and Address:  [facility] [place]. [facility], [address] [place], [place] [address]      Provider Number: [id number]  Attending Physician Name and Address:  Marzetta Board*  Relative Name and Phone Number:       Current Level of Care: Hospital Recommended Level of Care: Memory Care Prior Approval Number:    Date Approved/Denied:   PASRR Number:    Discharge Plan: Other (Comment)(memory care)    Current Diagnoses: Patient Active Problem List   Diagnosis Date Noted  . Pressure injury of skin 02/14/2019  . Sepsis (HCC) 02/13/2019  . Dehydration with hypernatremia 02/13/2019  . Altered mental status 02/13/2019  . Acute metabolic encephalopathy 11/05/2018  . Hypernatremia 11/01/2018  . Status post total replacement of right hip 11/12/2017  . Closed right hip fracture, initial encounter (HCC) 10/23/2017  . Closed subcapital fracture of right femur (HCC)   . Adjustment reaction with aggression 05/14/2017  . Type 2 diabetes mellitus with other circulatory complications (HCC) 06/19/2015  . Thrombocytopenia (HCC) 06/19/2015  . Cerebral infarction due to unspecified mechanism   . Dilantin toxicity 06/18/2015  . Phenytoin toxicity   . Stroke (HCC) 06/16/2015  . Fall 06/16/2015  . Epilepsy (HCC) 06/16/2015  . BPH (benign prostatic hyperplasia) 06/16/2015  . Leukocytosis 06/16/2015  . CVA (cerebral infarction) 06/16/2015  . CVA (cerebral vascular accident) (HCC) 06/15/2015  . GERD (gastroesophageal reflux disease) 04/23/2014  . Potassium (K) deficiency 12/11/2012  . Precordial pain 12/11/2012  . Dehydration 04/15/2012  . AKI (acute kidney injury) (HCC) 04/14/2012  . HLD (hyperlipidemia) 06/01/2010  . Essential hypertension,  benign 06/01/2010  . DYSPNEA 06/01/2010  . Diabetes mellitus without complication (HCC) 11/04/2009  . RECTAL BLEEDING 11/04/2009  . PERSONAL HX COLONIC POLYPS 11/04/2009    Orientation RESPIRATION BLADDER Height & Weight     (disoriented x4)  Normal External catheter, Incontinent(palced 02/13/19) Weight: 172 lb 6.4 oz (78.2 kg) Height:  5\' 9"  (175.3 cm)  BEHAVIORAL SYMPTOMS/MOOD NEUROLOGICAL BOWEL NUTRITION STATUS      Incontinent Diet(carb modified)  AMBULATORY STATUS COMMUNICATION OF NEEDS Skin   Limited Assist Verbally PU Stage and Appropriate Care(Unstageable ulcer on left hip)                       Personal Care Assistance Level of Assistance  Bathing, Feeding, Dressing Bathing Assistance: Limited assistance Feeding assistance: Independent Dressing Assistance: Limited assistance     Functional Limitations Info  Sight, Hearing, Speech Sight Info: Adequate Hearing Info: Adequate Speech Info: Adequate(disoriented x4)    SPECIAL CARE FACTORS FREQUENCY  PT (By licensed PT), OT (By licensed OT)                    Contractures Contractures Info: Not present    Additional Factors Info  Code Status, Allergies, Isolation Precautions Code Status Info: Full Code Allergies Info: No known allergies     Isolation Precautions Info: Droplet/contact     Current Medications (02/14/2019):  This is the current hospital active medication list Current Facility-Administered Medications  Medication Dose Route Frequency Provider Last Rate Last Dose  . 0.45 % sodium chloride infusion   Intravenous Continuous Dorcas Carrow, MD 125 mL/hr at 02/14/19 0214    . acetaminophen (TYLENOL)  tablet 650 mg  650 mg Oral Q6H PRN Dorcas Carrow, MD       Or  . acetaminophen (TYLENOL) suppository 650 mg  650 mg Rectal Q6H PRN Dorcas Carrow, MD      . aspirin EC tablet 81 mg  81 mg Oral Daily Dorcas Carrow, MD   81 mg at 02/14/19 0952  . carvedilol (COREG) tablet 3.125 mg  3.125 mg Oral BID  WC Dorcas Carrow, MD   3.125 mg at 02/14/19 0631  . ceFEPIme (MAXIPIME) 2 g in sodium chloride 0.9 % 100 mL IVPB  2 g Intravenous Q12H Almon Hercules, RPH 200 mL/hr at 02/14/19 0949 2 g at 02/14/19 0949  . Chlorhexidine Gluconate Cloth 2 % PADS 6 each  6 each Topical Q0600 Dorcas Carrow, MD   6 each at 02/14/19 [phone]  . enoxaparin (LOVENOX) injection 40 mg  40 mg Subcutaneous Q24H Dorcas Carrow, MD   40 mg at 02/13/19 2303  . escitalopram (LEXAPRO) tablet 10 mg  10 mg Oral Daily Dorcas Carrow, MD   10 mg at 02/14/19 0952  . galantamine (RAZADYNE ER) 24 hr capsule 16 mg  16 mg Oral Q breakfast Dorcas Carrow, MD   16 mg at 02/14/19 0631  . insulin aspart (novoLOG) injection 0-5 Units  0-5 Units Subcutaneous QHS Dorcas Carrow, MD   3 Units at 02/13/19 2305  . insulin aspart (novoLOG) injection 0-9 Units  0-9 Units Subcutaneous TID WC Dorcas Carrow, MD   3 Units at 02/14/19 0944  . insulin glargine (LANTUS) injection 10 Units  10 Units Subcutaneous BID Dorcas Carrow, MD   10 Units at 02/14/19 0951  . levETIRAcetam (KEPPRA) tablet 500 mg  500 mg Oral BID Dorcas Carrow, MD   500 mg at 02/14/19 0952  . mupirocin ointment (BACTROBAN) 2 % 1 application  1 application Nasal BID Dorcas Carrow, MD   1 application at 02/14/19 [phone]  . ondansetron (ZOFRAN) tablet 4 mg  4 mg Oral Q6H PRN Dorcas Carrow, MD       Or  . ondansetron (ZOFRAN) injection 4 mg  4 mg Intravenous Q6H PRN Dorcas Carrow, MD      . pantoprazole (PROTONIX) EC tablet 40 mg  40 mg Oral Daily Dorcas Carrow, MD   40 mg at 02/14/19 0952  . phenytoin (DILANTIN) ER capsule 300 mg  300 mg Oral Daily Dorcas Carrow, MD   300 mg at 02/14/19 0952  . risperiDONE (RISPERDAL) tablet 2 mg  2 mg Oral QHS Dorcas Carrow, MD   2 mg at 02/13/19 2303  . vancomycin (VANCOCIN) 1,000 mg in sodium chloride 0.9 % 250 mL IVPB  1,000 mg Intravenous Q12H Almon Hercules, [facility]         Discharge Medications: Please see discharge summary for a list of discharge  medications.  Relevant Imaging Results:  Relevant Lab Results:   Additional Information SSN: [id number]  Maree Krabbe, LCSW

## 2019-02-14 NOTE — TOC Initial Note (Signed)
Transition of Care [facility]) - Initial/Assessment Note    Patient Details  Name: Arthur Berry MRN: [id number] Date of Birth: 04-Sep-1946  Transition of Care [facility]) CM/SW Contact:    Maree Krabbe, LCSW Phone Number: 02/14/2019, 10:07 AM  Clinical Narrative:     Pt is disoriented x4. CSW spoke with pt's brother via telephone. Pt's brother states pt resides at [facility]. Pt was at [facility] in December of last year for rehab. Pt's brother states he may be getting therapy at [facility] but unsure of how ofter. Pt's brother provided CSW with contacts for [facility]; DD- [phone], Receptionist- [phone], Tammy- [phone]. Pt's brother was unsure of their job titles. Pt's brother states if pt has to go to SNF they are agreeable to [facility] again.  CSW will update pt's brother on disposition when determined.  Per pt's brother he was told pt's COVID-19 test was negative however, in our system pt has pending COVID-19 test.       High risk readmission risk screening completed with pt's brother.       Expected Discharge Plan: Skilled Nursing Facility Barriers to Discharge: Continued Medical Work up   Patient Goals and CMS Choice Patient states their goals for this hospitalization and ongoing recovery are:: to go back to ALF if able-per pt's brother, POA CMS Medicare.gov Compare Post Acute Care list provided to:: Patient Represenative (must comment) Choice offered to / list presented to : [facility] POA / Guardian  Expected Discharge Plan and Services Expected Discharge Plan: Skilled Nursing Facility In-house Referral: Clinical Social Work   Post Acute Care Choice: Skilled Nursing Facility Living arrangements for the past 2 months: Assisted Living Facility                          Prior Living Arrangements/Services Living arrangements for the past 2 months: Assisted Living Facility Lives with:: Self Patient language and need for  interpreter reviewed:: No Do you feel safe going back to the place where you live?: Yes      Need for Family Participation in Patient Care: No (Comment) Care giver support system in place?: Yes (comment)(Brother/POA)   Criminal Activity/Legal Involvement Pertinent to Current Situation/Hospitalization: No - Comment as needed  Activities of Daily Living      Permission Sought/Granted Permission sought to share information with : Facility Medical sales representative, Family Supports    Share Information with NAME: Brinkley [name]  Permission granted to share info w AGENCY: [facility], [facility]  Permission granted to share info w Relationship: Brother/ POA     Emotional Assessment Appearance:: Appears stated age Attitude/Demeanor/Rapport: Unable to Assess Affect (typically observed): Unable to Assess Orientation: : (disorriented x4) Alcohol / Substance Use: Not Applicable Psych Involvement: No (comment)  Admission diagnosis:  Dehydration [E86.0] SOB (shortness of breath) [R06.02] Generalized weakness [R53.1] Acute kidney injury (HCC) [N17.9] Leukocytosis, unspecified type [D72.829] Patient Active Problem List   Diagnosis Date Noted  . Pressure injury of skin 02/14/2019  . Sepsis (HCC) 02/13/2019  . Dehydration with hypernatremia 02/13/2019  . Altered mental status 02/13/2019  . Acute metabolic encephalopathy 11/05/2018  . Hypernatremia 11/01/2018  . Status post total replacement of right hip 11/12/2017  . Closed right hip fracture, initial encounter (HCC) 10/23/2017  . Closed subcapital fracture of right femur (HCC)   . Adjustment reaction with aggression 05/14/2017  . Type 2 diabetes mellitus with other circulatory complications (HCC) 06/19/2015  .  Thrombocytopenia (HCC) 06/19/2015  . Cerebral infarction due to unspecified mechanism   . Dilantin toxicity 06/18/2015  . Phenytoin toxicity   . Stroke (HCC) 06/16/2015  . Fall 06/16/2015  . Epilepsy (HCC)  06/16/2015  . BPH (benign prostatic hyperplasia) 06/16/2015  . Leukocytosis 06/16/2015  . CVA (cerebral infarction) 06/16/2015  . CVA (cerebral vascular accident) (HCC) 06/15/2015  . GERD (gastroesophageal reflux disease) 04/23/2014  . Potassium (K) deficiency 12/11/2012  . Precordial pain 12/11/2012  . Dehydration 04/15/2012  . AKI (acute kidney injury) (HCC) 04/14/2012  . HLD (hyperlipidemia) 06/01/2010  . Essential hypertension, benign 06/01/2010  . DYSPNEA 06/01/2010  . Diabetes mellitus without complication (HCC) 11/04/2009  . RECTAL BLEEDING 11/04/2009  . PERSONAL HX COLONIC POLYPS 11/04/2009   PCP:  Zeb Comfort Pharmacy:  No Pharmacies Listed    Social Determinants of Health (SDOH) Interventions    Readmission Risk Interventions No flowsheet data found.

## 2019-02-15 DIAGNOSIS — K219 Gastro-esophageal reflux disease without esophagitis: Secondary | ICD-10-CM

## 2019-02-15 DIAGNOSIS — E785 Hyperlipidemia, unspecified: Secondary | ICD-10-CM

## 2019-02-15 LAB — GLUCOSE, CAPILLARY
Glucose-Capillary: 203 mg/dL — ABNORMAL HIGH (ref 70–99)
Glucose-Capillary: 196 mg/dL — ABNORMAL HIGH (ref 70–99)
Glucose-Capillary: 225 mg/dL — ABNORMAL HIGH (ref 70–99)
Glucose-Capillary: 177 mg/dL — ABNORMAL HIGH (ref 70–99)

## 2019-02-15 LAB — CBC WITH DIFFERENTIAL/PLATELET
Abs Immature Granulocytes: 0.14 10*3/uL — ABNORMAL HIGH (ref 0.00–0.07)
Basophils Absolute: 0.1 10*3/uL (ref 0.0–0.1)
Basophils Relative: 1 %
Eosinophils Absolute: 0.9 10*3/uL — ABNORMAL HIGH (ref 0.0–0.5)
Eosinophils Relative: 6 %
HCT: 31.4 % — ABNORMAL LOW (ref 39.0–52.0)
Hemoglobin: 10.2 g/dL — ABNORMAL LOW (ref 13.0–17.0)
Immature Granulocytes: 1 %
Lymphocytes Relative: 14 %
Lymphs Abs: 1.9 10*3/uL (ref 0.7–4.0)
MCH: 31.4 pg (ref 26.0–34.0)
MCHC: 32.5 g/dL (ref 30.0–36.0)
MCV: 96.6 fL (ref 80.0–100.0)
Monocytes Absolute: 0.8 10*3/uL (ref 0.1–1.0)
Monocytes Relative: 6 %
Neutro Abs: 9.9 10*3/uL — ABNORMAL HIGH (ref 1.7–7.7)
Neutrophils Relative %: 72 %
Platelets: 330 10*3/uL (ref 150–400)
RBC: 3.25 MIL/uL — ABNORMAL LOW (ref 4.22–5.81)
RDW: 13.5 % (ref 11.5–15.5)
WBC: 13.6 10*3/uL — ABNORMAL HIGH (ref 4.0–10.5)
nRBC: 0 % (ref 0.0–0.2)

## 2019-02-15 LAB — BASIC METABOLIC PANEL
Anion gap: 5 (ref 5–15)
BUN: 20 mg/dL (ref 8–23)
CO2: 25 mmol/L (ref 22–32)
Calcium: 8.5 mg/dL — ABNORMAL LOW (ref 8.9–10.3)
Chloride: 118 mmol/L — ABNORMAL HIGH (ref 98–111)
Creatinine, Ser: 0.9 mg/dL (ref 0.61–1.24)
GFR calc Af Amer: >60 mL/min (ref >60)
GFR calc non Af Amer: >60 mL/min (ref >60)
Glucose, Bld: 281 mg/dL — ABNORMAL HIGH (ref 70–99)
Potassium: 3.7 mmol/L (ref 3.5–5.1)
Sodium: 148 mmol/L — ABNORMAL HIGH (ref 135–145)

## 2019-02-15 LAB — PHENYTOIN LEVEL, TOTAL: Phenytoin Lvl: 11.9 ug/mL (ref 10.0–20.0)

## 2019-02-15 MED ORDER — PHENYTOIN SODIUM 50 MG/ML IJ SOLN
100.0000 mg | Freq: Two times a day (BID) | INTRAMUSCULAR | Status: DC
  Administered 2019-02-15 – 2019-02-19 (×8): 100 mg via INTRAVENOUS
  Filled 2019-02-15 (×8): qty 2

## 2019-02-15 NOTE — Progress Notes (Addendum)
PROGRESS NOTE    Arthur Berry  WUJ:[id number] DOB: 01-18-1946 DOA: 02/13/2019 PCP: Zeb Comfort   Brief Narrative:  Per admitting MD Mendel Ryder Appleis a 73 y.o.malewith medical history significant oftype 2 diabetes on insulin and metformin, dementia, long-term nursing home resident, hypertension, history of seizure who is brought to the emergency room with altered mental status. Patient is currently altered and unable to provide any history. Discussed with patient's brother who had seen him 3 weeks ago before stay home order and had no complaints. At baseline, patient is total dependent and is risk of fall on attempted ambulation. He has some behavior disturbances due to cognitive dysfunction as per his brother. No nursing home records are sent with patient. I called Zeb Comfort, had to leave voice message to the provider. EMS was called with concern about more lethargic, decreased appetite and generalized weakness. He was reportedly more agitated at the nursing home and also agitated at ambulance as per EMS staff. EMS reported that his blood pressure was low on the field, however his blood pressures has been stable now. ED Course:Blood pressures are stable. Patient is on room air. WBC count is 16.3. Creatinine 1.35. His sodium is also 149. Blood sugars are 323. Chest x-ray is essentially normal. Reportedly he was treated with azithromycin.  EMS was told that patient had a negative COVID-19 test within a week and was treated for pneumonia with azithromycin. We will try to connect to nursing home again. His brother was not aware.   Assessment & Plan:   Principal Problem:   Sepsis (HCC) Active Problems:   Diabetes mellitus without complication (HCC)   HLD (hyperlipidemia)   Essential hypertension, benign   GERD (gastroesophageal reflux disease)   CVA (cerebral vascular accident) (HCC)   Epilepsy (HCC)   Acute metabolic encephalopathy   Dehydration with  hypernatremia   Altered mental status   Pressure injury of skin  Sepsis with possible rhinovirus. Present on admission.  As noted by admitting physician, chest x-ray not very impressive patient does not have much pulmonary symptoms. Urinalysis is normal. Due to severity of presentation, will continue to treat him as sepsis with unknown source. Patient received vancomycin and cefepime in the ER, I will also continue until clinical improvement or additional culture reports available.  Possible COVID.  Admitting physician was unable to ascertain if patient was truly negative, pathway initiated, RVP showed rhinovirus, f/up covid for return to snf  Hypernatremia with dehydration: I agree, this is probably due to poor oral intake and free water deficit. Patient was treated with normal saline initially in the emergency room this was changed to half-normal saline which I will continue. Recheck levels again in the morning.  Type 2 diabetes with uncontrolled blood sugars: Currently unreliable oral intake, keep on sliding scale insulin, check bg achs  Dementia with history of a stroke and seizure: CT head with no new findings. Patient is on Keppra and phenytoin for seizure and no reported seizures recently.  Hypertension: Initially blood pressure meds were held secondary concerns for risk of sepsis and hypotension.  Patient blood pressure is increasing.  I will add IV medications for now secondary to nursing concern and family concern of impaired swallowing and risk of aspiration.  DysphaGia.Marland Kitchen  appreciate speech eval and recs  Skin pressure injury left hip.  Wound care consult placed obtaining imaging for their evaluation and recommendations, will hold off on gs consult now   DVT prophylaxis: Lovenox SQ  Code Status: DNR per note  but fullk code in chart-trying to clarify with brother    Code Status Orders  (From admission, onward)         Start     Ordered   02/13/19 1956  Full code   Continuous     02/13/19 1955        Code Status History    Date Active Date Inactive Code Status Order ID Comments User Context   02/13/2019 1318 02/13/2019 1955 DNR [id number]  Dorcas Carrow, MD ED   11/01/2018 1637 11/07/2018 2104 Full Code [id number]  Lanae Boast, MD Inpatient   10/23/2017 0522 10/27/2017 1905 Full Code [id number]  Eduard Clos, MD ED   05/13/2017 2249 05/14/2017 1701 Full Code [id number]  Arthor Captain, PA-C ED   06/16/2015 0024 06/22/2015 1439 Full Code [id number]  Lorretta Harp, MD ED    Advance Directive Documentation     Most Recent Value  Type of Advance Directive  Out of facility DNR (pink MOST or yellow form)  Pre-existing out of facility DNR order (yellow form or pink MOST form)  --  "MOST" Form in Place?  --     Family Communication: calling brother harvey-LM again Disposition Plan:   Patient had significant disease on presentation, sepsis, dehydration with hypernatremia. He will continue to need inpatient treatment management and titration of medications and IV antibiotics with frequent monitoring of electrolytes, supplemental O2,.  Consults called: None Admission status: Inpatient   Consultants:   None  Procedures:  Ct Head Wo Contrast  Result Date: 02/13/2019 CLINICAL DATA:  AMS- unclear causeLess responsive, double pneumonia 1 week ago EXAM: CT HEAD WITHOUT CONTRAST TECHNIQUE: Contiguous axial images were obtained from the base of the skull through the vertex without intravenous contrast. COMPARISON:  12/22/2027 and older exams. FINDINGS: Brain: There are no parenchymal masses, no evidence of a recent infarct and no intracranial hemorrhage. There are no extra-axial masses. Developmental anomalies including agenesis of the corpus callosum, large left sided interhemispheric cyst and ventriculomegaly are unchanged. White matter hypoattenuation consistent with chronic microvascular ischemic change is also stable. Vascular: No hyperdense vessel or unexpected  calcification. Skull: Normal. Negative for fracture or focal lesion. Sinuses/Orbits: Globes and orbits are unremarkable. There is mucosal thickening with dependent fluid in the left maxillary sphenoid and left frontal sinuses and posterior ethmoid air cells. Additional mucosal thickening is noted along the remaining ethmoid air cells and in the posterior right maxillary sinus. Many of the left mastoid air cells are opacified with fluid attenuation. Clear right mastoid air cells. Other: None. IMPRESSION: 1. No acute intracranial abnormalities. 2. Chronic changes from developmental anomalies and white matter changes consistent with chronic microvascular ischemic change, stable from the prior exam. 3. No significant sinus disease including air-fluid levels. Acute sinusitis should be considered in the proper clinical setting. Electronically Signed   By: Amie Portland M.D.   On: 02/13/2019 11:43   Dg Chest Port 1 View  Result Date: 02/13/2019 CLINICAL DATA:  Shortness of breath and fever. Altered mental status. EXAM: PORTABLE CHEST 1 VIEW COMPARISON:  11/01/2018 FINDINGS: Artifact overlies the chest. Heart size is normal. Mediastinal shadows show chronic mass-effect upon the trachea displacing it towards the right, most likely secondary to thyroid goiter. The lungs are clear by radiography. No consolidation or collapse. No effusions. IMPRESSION: No active disease. Electronically Signed   By: Paulina Fusi M.D.   On: 02/13/2019 10:53     Antimicrobials:   Vancomycin and cefepime day 3   Subjective: Patient answers questions  with significant prompting.  Flat affect, no acute decompensation overnight.  Objective: Vitals:   02/14/19 2300 02/15/19 0000 02/15/19 0300 02/15/19 0815  BP: (!) 147/69  (!) 151/68 (!) 164/72  Pulse: 85 85 74 70  Resp: (!) 21 (!) Temp: 98.3 F (36.8 C)  98.6 F (37 C) 97.9 F (36.6 C)  TempSrc: Oral  Oral Oral  SpO2: 96% 96% 97% 100%  Weight:   81.2 kg   Height:         Intake/Output Summary (Last 24 hours) at 02/15/2019 1147 Last data filed at 02/15/2019 1059 Gross per 24 hour  Intake 4678.92 ml  Output 925 ml  Net 3753.92 ml   Filed Weights   02/13/19 2040 02/14/19 0600 02/15/19 0300  Weight: 76.6 kg 78.2 kg 81.2 kg    Examination:  General exam: Appears calm and comfortable  Respiratory system: Mild rhonchi bilaterally, trace wheezing, no accessory muscle use Cardiovascular system: S1 & S2 heard, RRR. No JVD, murmurs, rubs, gallops or clicks. No pedal edema. Gastrointestinal system: Abdomen is nondistended, soft and nontender. No organomegaly or masses felt. Normal bowel sounds heard. Central nervous system: Alert and oriented. No focal neurological deficits. Extremities: Symmetric 5 x 5 power.  No contractures noted Skin: No rashes, lesions or ulcers Psychiatry: Judgement and insight limited secondary to known cognitive deficits.  Mood affect remained flat    Data Reviewed: I have personally reviewed following labs and imaging studies  CBC: Recent Labs  Lab 02/13/19 0958 02/14/19 0659 02/15/19 0339  WBC 16.3* 13.6* 13.6*  NEUTROABS 12.9*  --  9.9*  HGB 12.2* 10.3* 10.2*  HCT 37.5* 32.6* 31.4*  MCV 96.6 95.6 96.6  PLT 371 307 330   Basic Metabolic Panel: Recent Labs  Lab 02/13/19 0958 02/14/19 0659 02/15/19 0339  NA 149* 148* 148*  K 3.8 3.2* 3.7  CL 114* 116* 118*  CO2 GLUCOSE 323* 229* 281*  BUN 40* 30* 20  CREATININE 1.35* 0.91 0.90  CALCIUM 9.0 8.4* 8.5*   GFR: Estimated Creatinine Clearance: 73.1 mL/min (by C-G formula based on SCr of 0.9 mg/dL). Liver Function Tests: Recent Labs  Lab 02/13/19 0958  AST 41  ALT 43  ALKPHOS 108  BILITOT 0.8  PROT 6.5  ALBUMIN 2.2*   No results for input(s): LIPASE, AMYLASE in the last 168 hours. No results for input(s): AMMONIA in the last 168 hours. Coagulation Profile: No results for input(s): INR, PROTIME in the last 168 hours. Cardiac Enzymes: Recent  Labs  Lab 02/13/19 0958  TROPONINI <0.03   BNP (last 3 results) No results for input(s): PROBNP in the last 8760 hours. HbA1C: No results for input(s): HGBA1C in the last 72 hours. CBG: Recent Labs  Lab 02/14/19 1121 02/14/19 1744 02/14/19 2139 02/15/19 0813 02/15/19 1101  GLUCAP 197* 173* 327* 203* 196*   Lipid Profile: No results for input(s): CHOL, HDL, LDLCALC, TRIG, CHOLHDL, LDLDIRECT in the last 72 hours. Thyroid Function Tests: No results for input(s): TSH, T4TOTAL, FREET4, T3FREE, THYROIDAB in the last 72 hours. Anemia Panel: No results for input(s): VITAMINB12, FOLATE, FERRITIN, TIBC, IRON, RETICCTPCT in the last 72 hours. Sepsis Labs: Recent Labs  Lab 02/13/19 0958 02/13/19 1445  LATICACIDVEN 1.7 1.2    Recent Results (from the past 240 hour(s))  Blood Culture (routine x 2)     Status: None (Preliminary result)   Collection Time: 02/13/19 10:02 AM  Result Value Ref Range Status   Specimen Description  BLOOD RIGHT ANTECUBITAL  Final   Special Requests   Final    BOTTLES DRAWN AEROBIC AND ANAEROBIC Blood Culture adequate volume   Culture   Final    NO GROWTH 1 DAY Performed at [facility], [address], [place], [place] [address]    Report Status PENDING  Incomplete  Blood Culture (routine x 2)     Status: None (Preliminary result)   Collection Time: 02/13/19 10:30 AM  Result Value Ref Range Status   Specimen Description BLOOD LEFT HAND  Final   Special Requests   Final    BOTTLES DRAWN AEROBIC ONLY Blood Culture results may not be optimal due to an inadequate volume of blood received in culture bottles   Culture   Final    NO GROWTH 1 DAY Performed at [facility], [address], [place], [place] [address]    Report Status PENDING  Incomplete  Urine culture     Status: None   Collection Time: 02/13/19 10:30 AM  Result Value Ref Range Status   Specimen Description URINE, CLEAN CATCH  Final   Special Requests NONE  Final   Culture    Final    NO GROWTH Performed at [facility], [address] [place], [place] [address]    Report Status 02/14/2019 FINAL  Final  MRSA PCR Screening     Status: Abnormal   Collection Time: 02/13/19  8:55 PM  Result Value Ref Range Status   MRSA by PCR POSITIVE (A) NEGATIVE Final    Comment:        The GeneXpert MRSA Assay (FDA approved for NASAL specimens only), is one component of a comprehensive MRSA colonization surveillance program. It is not intended to diagnose MRSA infection nor to guide or monitor treatment for MRSA infections. RESULT CALLED TO, READ BACK BY AND VERIFIED WITHGavin Potters RN 02/13/19 2337 JDW Performed at [facility], [address], [place], [place] [address]   Respiratory Panel by PCR     Status: Abnormal   Collection Time: 02/14/19  8:08 AM  Result Value Ref Range Status   Adenovirus NOT DETECTED NOT DETECTED Final   Coronavirus 229E NOT DETECTED NOT DETECTED Final    Comment: (NOTE) The Coronavirus on the Respiratory Panel, DOES NOT test for the novel  Coronavirus (2019 nCoV)    Coronavirus HKU1 NOT DETECTED NOT DETECTED Final   Coronavirus NL63 NOT DETECTED NOT DETECTED Final   Coronavirus OC43 NOT DETECTED NOT DETECTED Final   Metapneumovirus NOT DETECTED NOT DETECTED Final   Rhinovirus / Enterovirus DETECTED (A) NOT DETECTED Final   Influenza A NOT DETECTED NOT DETECTED Final   Influenza B NOT DETECTED NOT DETECTED Final   Parainfluenza Virus 1 NOT DETECTED NOT DETECTED Final   Parainfluenza Virus 2 NOT DETECTED NOT DETECTED Final   Parainfluenza Virus 3 NOT DETECTED NOT DETECTED Final   Parainfluenza Virus 4 NOT DETECTED NOT DETECTED Final   Respiratory Syncytial Virus NOT DETECTED NOT DETECTED Final   Bordetella pertussis NOT DETECTED NOT DETECTED Final   Chlamydophila pneumoniae NOT DETECTED NOT DETECTED Final   Mycoplasma pneumoniae NOT DETECTED NOT DETECTED Final    Comment: Performed at [facility], [address] [place], [place] [address]         Radiology Studies: No results found.      Scheduled Meds:  aspirin EC  81 mg Oral Daily   Chlorhexidine Gluconate Cloth  6 each  Topical Q0600   collagenase   Topical Daily   enoxaparin (LOVENOX) injection  40 mg Subcutaneous Q24H   insulin aspart  0-5 Units Subcutaneous QHS   insulin aspart  0-9 Units Subcutaneous TID WC   insulin glargine  10 Units Subcutaneous BID   metoprolol tartrate  2.5 mg Intravenous Q6H   mupirocin ointment  1 application Nasal BID   pantoprazole (PROTONIX) IV  40 mg Intravenous Q24H   Continuous Infusions:  sodium chloride 125 mL/hr at 02/15/19 1059   ceFEPime (MAXIPIME) IV Stopped (02/15/19 0851)   levETIRAcetam Stopped (02/15/19 1610)   phenytoin (DILANTIN) IV 206 mL/hr at 02/15/19 1059   vancomycin Stopped (02/15/19 1025)     LOS: 2 days    Time spent: 35 min    Burke Keels, MD [facility]  If 7PM-7AM, please contact night-coverage  02/15/2019, 11:47 AM

## 2019-02-16 LAB — BASIC METABOLIC PANEL
Anion gap: 6 (ref 5–15)
BUN: 12 mg/dL (ref 8–23)
CO2: 25 mmol/L (ref 22–32)
Calcium: 8.5 mg/dL — ABNORMAL LOW (ref 8.9–10.3)
Chloride: 111 mmol/L (ref 98–111)
Creatinine, Ser: 0.78 mg/dL (ref 0.61–1.24)
GFR calc Af Amer: >60 mL/min (ref >60)
GFR calc non Af Amer: >60 mL/min (ref >60)
Glucose, Bld: 181 mg/dL — ABNORMAL HIGH (ref 70–99)
Potassium: 3.2 mmol/L — ABNORMAL LOW (ref 3.5–5.1)
Sodium: 142 mmol/L (ref 135–145)

## 2019-02-16 LAB — CBC WITH DIFFERENTIAL/PLATELET
Abs Immature Granulocytes: 0.19 10*3/uL — ABNORMAL HIGH (ref 0.00–0.07)
Basophils Absolute: 0.1 10*3/uL (ref 0.0–0.1)
Basophils Relative: 1 %
Eosinophils Absolute: 1.3 10*3/uL — ABNORMAL HIGH (ref 0.0–0.5)
Eosinophils Relative: 9 %
HCT: 30.3 % — ABNORMAL LOW (ref 39.0–52.0)
Hemoglobin: 10 g/dL — ABNORMAL LOW (ref 13.0–17.0)
Immature Granulocytes: 1 %
Lymphocytes Relative: 14 %
Lymphs Abs: 1.9 10*3/uL (ref 0.7–4.0)
MCH: 31.1 pg (ref 26.0–34.0)
MCHC: 33 g/dL (ref 30.0–36.0)
MCV: 94.1 fL (ref 80.0–100.0)
Monocytes Absolute: 0.9 10*3/uL (ref 0.1–1.0)
Monocytes Relative: 6 %
Neutro Abs: 9 10*3/uL — ABNORMAL HIGH (ref 1.7–7.7)
Neutrophils Relative %: 69 %
Platelets: 312 10*3/uL (ref 150–400)
RBC: 3.22 MIL/uL — ABNORMAL LOW (ref 4.22–5.81)
RDW: 13.1 % (ref 11.5–15.5)
WBC: 13.3 10*3/uL — ABNORMAL HIGH (ref 4.0–10.5)
nRBC: 0 % (ref 0.0–0.2)

## 2019-02-16 LAB — GLUCOSE, CAPILLARY
Glucose-Capillary: 144 mg/dL — ABNORMAL HIGH (ref 70–99)
Glucose-Capillary: 130 mg/dL — ABNORMAL HIGH (ref 70–99)
Glucose-Capillary: 202 mg/dL — ABNORMAL HIGH (ref 70–99)

## 2019-02-16 LAB — VANCOMYCIN, RANDOM: Vancomycin Rm: 37

## 2019-02-16 LAB — VANCOMYCIN, TROUGH: Vancomycin Tr: 18 ug/mL (ref 15–20)

## 2019-02-16 LAB — PHENYTOIN LEVEL, TOTAL: Phenytoin Lvl: 9.8 ug/mL — ABNORMAL LOW (ref 10.0–20.0)

## 2019-02-16 MED ORDER — VANCOMYCIN HCL IN DEXTROSE 750-5 MG/150ML-% IV SOLN
750.0000 mg | Freq: Two times a day (BID) | INTRAVENOUS | Status: DC
  Administered 2019-02-17 (×2): 750 mg via INTRAVENOUS
  Filled 2019-02-16 (×3): qty 150

## 2019-02-16 NOTE — Progress Notes (Signed)
Patient will need PT/OT evaluation before he is able to discharge. CSW paged MD to see if orders could be put in.   CSW will continue to follow.   Drucilla Schmidt, MSW, LCSW-A Clinical Social Worker [employer]

## 2019-02-16 NOTE — Progress Notes (Signed)
Pharmacy Antibiotic Note  Arthur Berry is a 73 y.o. male admitted on 02/13/2019 with pneumonia.  Pharmacy has been consulted for vancomycin and cefepime dosing - day #4. Pt is afebrile and WBC trend down. Scr improved again. Vancomycin peak and trough drawn appropriately. Vanc Peak at 1240 this am 63mcg/ml, trough at 2116 this evening at 46mcg/ml. Dosing calculator suggests to decrease dose.   Plan: Adjust vancomycin to 750mg  IV Q12H Per AUC dosing calculator. Goal AUC 400-550. Expected AUC: 483 SCr used: 0.91 Adjust cefepime 2gm IV Q12H F/u renal fxn, C&S, clinical status, vancomycin levels as indicated  Height: 5\' 9"  (175.3 cm) Weight: 183 lb 10.3 oz (83.3 kg) IBW/kg (Calculated) : 70.7  Temp (24hrs), Avg:98 F (36.7 C), Min:96.6 F (35.9 C), Max:99.1 F (37.3 C)  Recent Labs  Lab 02/13/19 0958 02/13/19 1445 02/14/19 0659 02/15/19 0339 02/16/19 0916 02/16/19 1240 02/16/19 2116  WBC 16.3*  --  13.6* 13.6* 13.3*  --   --   CREATININE 1.35*  --  0.91 0.90 0.78  --   --   LATICACIDVEN 1.7 1.2  --   --   --   --   --   VANCOTROUGH  --   --   --   --   --   --  18  VANCORANDOM  --   --   --   --   --  37  --     Estimated Creatinine Clearance: 82.2 mL/min (by C-G formula based on SCr of 0.78 mg/dL).    No Known Allergies  Antimicrobials this admission: Vanc 4/2>> Cefepime 4/2>>  Dose adjustments this admission: 4/3 cefepime to 2g IV q12 4/3 vanc to 1g IV q12h 4/5 vanc to 750mg  IV q12h  Microbiology results: 4/2 mrsa pcr + 4/2 bcx - 4/2 UC - neg 4/2 covid - ip   Bradley Ferris, PharmD 02/16/2019 10:33 PM PGY-1 Pharmacy Resident Direct Phone: [phone] Please check [email or web address] for unit-specific pharmacist phone numbers

## 2019-02-16 NOTE — Progress Notes (Signed)
PROGRESS NOTE    Arthur Berry  ZOX:[id number] DOB: [birth year] DOA: 02/13/2019 PCP: Zeb Comfort   Brief Narrative:  Arthur Berry a 73 y.o.malewith medical history significant oftype 2 diabetes on insulin and metformin, dementia, long-term nursing home resident, hypertension, history of seizure who is brought to the emergency room with altered mental status. Patient is currently altered and unable to provide any history. Discussed with patient's brother who had seen him 3 weeks ago before stay home order and had no complaints. At baseline, patient is total dependent and is risk of fall on attempted ambulation. He has some behavior disturbances due to cognitive dysfunction as per his brother.  While in the [facility] patient had a respiratory virus panel which showed positive rhinovirus.  I contacted Arthur Berry who gave verbal reports that the test was negative I have asked for faxed confirmation prior to moving the patient off the unit and discontinuing droplet and contact precautions.   Assessment & Plan:   Principal Problem:   Sepsis (HCC) Active Problems:   Diabetes mellitus without complication (HCC)   HLD (hyperlipidemia)   Essential hypertension, benign   GERD (gastroesophageal reflux disease)   CVA (cerebral vascular accident) (HCC)   Epilepsy (HCC)   Acute metabolic encephalopathy   Dehydration with hypernatremia   Altered mental status   Pressure injury of skin   Sepsis with possible rhinovirus. Present on admission.  As noted by admitting physician, chest x-ray not very impressive patient does not have much pulmonary symptoms. Urinalysis is normal. Due to severity of presentation, willcontinue totreat him as sepsis with unknown source. Patient received vancomycin and cefepime in the ER,I will alsocontinue until clinical improvement blood cultures remain negative urine culture no growth  PossibleCOVID. Admitting physician was unable to ascertain  if patient was truly negative, pathway initiated, RVP showed rhinovirus, I contacted Arthur Berry who said his COVID-19 was negative.  I have asked for faxed confirmation.  Once received will discontinue droplet and contact precautions pursue PT and OT evaluation with anticipated discharge and return to skilled nursing facility  Hypernatremia with dehydration:This has corrected with appropriate fluid resuscitation. Type 2 diabetes with uncontrolled blood sugars: Currently unreliable oral intake, keep on sliding scale insulin, check bg achs  Dementia with history of a stroke and seizure: CT head with no new findings. Patient is on Keppra and phenytoin for seizure and no reported seizures recently.  Hypertension:Initially blood pressure meds were held secondary concerns for risk of sepsis and hypotension. Patient blood pressure is increasing. I will add IV medications for now secondary to nursing concern and family concern of impaired swallowing and risk of aspiration.  DysphaGia.Marland Kitchenappreciate speech eval and recs  Skin pressure injury left hip. Wound care consult placed obtaining imaging for their evaluation and recommendations, will hold off on gs consult now, will reevaluate Monday   DVT prophylaxis: Lovenox SQ  Code Status: DNR per his brother Arthur Berry who I spoke to April 5    Code Status Orders  (From admission, onward)         Start     Ordered   02/13/19 1956  Full code  Continuous     02/13/19 1955        Code Status History    Date Active Date Inactive Code Status Order ID Comments User Context   02/13/2019 1318 02/13/2019 1955 DNR [id number]  Dorcas Carrow, MD ED   11/01/2018 1637 11/07/2018 2104 Full Code [id number]  Lanae Boast, MD Inpatient   10/23/2017 0522  10/27/2017 1905 Full Code [id number]  Eduard Clos, MD ED   05/13/2017 2249 05/14/2017 1701 Full Code [id number]  Arthor Captain, PA-C ED   06/16/2015 0024 06/22/2015 1439 Full Code [id number]  Lorretta Harp, MD ED     Advance Directive Documentation     Most Recent Value  Type of Advance Directive  Out of facility DNR (pink MOST or yellow form)  Pre-existing out of facility DNR order (yellow form or pink MOST form)  --  "MOST" Form in Place?  --     Family Communication: Discussed with brother Arthur Berry today answered all questions.  He indicated patient is a DNR Disposition Plan:   Patient hadsignificant disease on presentation, sepsis, dehydration with hypernatremia. He willcontinue toneed inpatient treatment management and titration of medications and IV antibioticswith frequent monitoring of electrolytes, supplemental O2,. Consults called: None Admission status: Inpatient   Consultants:   None  Procedures:  Ct Head Wo Contrast  Result Date: 02/13/2019 CLINICAL DATA:  AMS- unclear causeLess responsive, double pneumonia 1 week ago EXAM: CT HEAD WITHOUT CONTRAST TECHNIQUE: Contiguous axial images were obtained from the base of the skull through the vertex without intravenous contrast. COMPARISON:  12/22/2027 and older exams. FINDINGS: Brain: There are no parenchymal masses, no evidence of a recent infarct and no intracranial hemorrhage. There are no extra-axial masses. Developmental anomalies including agenesis of the corpus callosum, large left sided interhemispheric cyst and ventriculomegaly are unchanged. White matter hypoattenuation consistent with chronic microvascular ischemic change is also stable. Vascular: No hyperdense vessel or unexpected calcification. Skull: Normal. Negative for fracture or focal lesion. Sinuses/Orbits: Globes and orbits are unremarkable. There is mucosal thickening with dependent fluid in the left maxillary sphenoid and left frontal sinuses and posterior ethmoid air cells. Additional mucosal thickening is noted along the remaining ethmoid air cells and in the posterior right maxillary sinus. Many of the left mastoid air cells are opacified with fluid attenuation. Clear  right mastoid air cells. Other: None. IMPRESSION: 1. No acute intracranial abnormalities. 2. Chronic changes from developmental anomalies and white matter changes consistent with chronic microvascular ischemic change, stable from the prior exam. 3. No significant sinus disease including air-fluid levels. Acute sinusitis should be considered in the proper clinical setting. Electronically Signed   By: Amie Portland M.D.   On: 02/13/2019 11:43   Dg Chest Port 1 View  Result Date: 02/13/2019 CLINICAL DATA:  Shortness of breath and fever. Altered mental status. EXAM: PORTABLE CHEST 1 VIEW COMPARISON:  11/01/2018 FINDINGS: Artifact overlies the chest. Heart size is normal. Mediastinal shadows show chronic mass-effect upon the trachea displacing it towards the right, most likely secondary to thyroid goiter. The lungs are clear by radiography. No consolidation or collapse. No effusions. IMPRESSION: No active disease. Electronically Signed   By: Paulina Fusi M.D.   On: 02/13/2019 10:53     Antimicrobials:   Vanco and cefepime day 4   Subjective: Patient still remains minimally interactive very difficult to get to engage, he is not lethargic but seems reticent to communicate.  Objective: Vitals:   02/15/19 1906 02/15/19 2338 02/16/19 0300 02/16/19 0749  BP: (!) 169/71 (!) 182/81 (!) 171/70   Pulse: 72 74 63   Resp: (!) 72 18 18   Temp: 98.9 F (37.2 C) 99.1 F (37.3 C) 98.2 F (36.8 C) 98.2 F (36.8 C)  TempSrc: Oral Axillary Oral Oral  SpO2: 97% 97% 96%   Weight:   83.3 kg   Height:  Intake/Output Summary (Last 24 hours) at 02/16/2019 1239 Last data filed at 02/16/2019 0800 Gross per 24 hour  Intake 2854.36 ml  Output 1300 ml  Net 1554.36 ml   Filed Weights   02/14/19 0600 02/15/19 0300 02/16/19 0300  Weight: 78.2 kg 81.2 kg 83.3 kg    Examination:  General exam: Appears calm and comfortable  Respiratory system: Mild rhonchi bilaterally, no accessory muscle use Cardiovascular  system: S1 & S2 heard, RRR. No JVD, murmurs, rubs, gallops or clicks. No pedal edema. Gastrointestinal system: Abdomen is nondistended, soft and nontender. No organomegaly or masses felt. Normal bowel sounds heard. Central nervous system: Alert and oriented. No focal neurological deficits. Extremities: Symmetric 5 x 5 power. Skin: No rashes, lesions or ulcers Psychiatry: Judgement and insight appear normal. Mood & affect appropriate.     Data Reviewed: I have personally reviewed following labs and imaging studies  CBC: Recent Labs  Lab 02/13/19 0958 02/14/19 0659 02/15/19 0339 02/16/19 0916  WBC 16.3* 13.6* 13.6* 13.3*  NEUTROABS 12.9*  --  9.9* 9.0*  HGB 12.2* 10.3* 10.2* 10.0*  HCT 37.5* 32.6* 31.4* 30.3*  MCV 96.6 95.6 96.6 94.1  PLT 371 307 330 312   Basic Metabolic Panel: Recent Labs  Lab 02/13/19 0958 02/14/19 0659 02/15/19 0339 02/16/19 0916  NA 149* 148* 148* 142  K 3.8 3.2* 3.7 3.2*  CL 114* 116* 118* 111  CO2 GLUCOSE 323* 229* 281* 181*  BUN 40* 30* 20 12  CREATININE 1.35* 0.91 0.90 0.78  CALCIUM 9.0 8.4* 8.5* 8.5*   GFR: Estimated Creatinine Clearance: 82.2 mL/min (by C-G formula based on SCr of 0.78 mg/dL). Liver Function Tests: Recent Labs  Lab 02/13/19 0958  AST 41  ALT 43  ALKPHOS 108  BILITOT 0.8  PROT 6.5  ALBUMIN 2.2*   No results for input(s): LIPASE, AMYLASE in the last 168 hours. No results for input(s): AMMONIA in the last 168 hours. Coagulation Profile: No results for input(s): INR, PROTIME in the last 168 hours. Cardiac Enzymes: Recent Labs  Lab 02/13/19 0958  TROPONINI <0.03   BNP (last 3 results) No results for input(s): PROBNP in the last 8760 hours. HbA1C: No results for input(s): HGBA1C in the last 72 hours. CBG: Recent Labs  Lab 02/15/19 0813 02/15/19 1101 02/15/19 1722 02/15/19 2121 02/16/19 0744  GLUCAP 203* 196* 225* 177* 144*   Lipid Profile: No results for input(s): CHOL, HDL, LDLCALC, TRIG,  CHOLHDL, LDLDIRECT in the last 72 hours. Thyroid Function Tests: No results for input(s): TSH, T4TOTAL, FREET4, T3FREE, THYROIDAB in the last 72 hours. Anemia Panel: No results for input(s): VITAMINB12, FOLATE, FERRITIN, TIBC, IRON, RETICCTPCT in the last 72 hours. Sepsis Labs: Recent Labs  Lab 02/13/19 0958 02/13/19 1445  LATICACIDVEN 1.7 1.2    Recent Results (from the past 240 hour(s))  Blood Culture (routine x 2)     Status: None (Preliminary result)   Collection Time: 02/13/19 10:02 AM  Result Value Ref Range Status   Specimen Description BLOOD RIGHT ANTECUBITAL  Final   Special Requests   Final    BOTTLES DRAWN AEROBIC AND ANAEROBIC Blood Culture adequate volume   Culture   Final    NO GROWTH 2 DAYS Performed at [facility], [address], [place], [place] [address]    Report Status PENDING  Incomplete  Blood Culture (routine x 2)     Status: None (Preliminary result)   Collection Time: 02/13/19 10:30 AM  Result  Value Ref Range Status   Specimen Description BLOOD LEFT HAND  Final   Special Requests   Final    BOTTLES DRAWN AEROBIC ONLY Blood Culture results may not be optimal due to an inadequate volume of blood received in culture bottles   Culture   Final    NO GROWTH 2 DAYS Performed at [facility], [address], [place], [place] [address]    Report Status PENDING  Incomplete  Urine culture     Status: None   Collection Time: 02/13/19 10:30 AM  Result Value Ref Range Status   Specimen Description URINE, CLEAN CATCH  Final   Special Requests NONE  Final   Culture   Final    NO GROWTH Performed at [facility], [address], [place], [place] [address]    Report Status 02/14/2019 FINAL  Final  MRSA PCR Screening     Status: Abnormal   Collection Time: 02/13/19  8:55 PM  Result Value Ref Range Status   MRSA by PCR POSITIVE (A) NEGATIVE Final    Comment:        The GeneXpert MRSA Assay (FDA approved for NASAL specimens only), is one  component of a comprehensive MRSA colonization surveillance program. It is not intended to diagnose MRSA infection nor to guide or monitor treatment for MRSA infections. RESULT CALLED TO, READ BACK BY AND VERIFIED WITHGavin Potters RN 02/13/19 2337 JDW Performed at [facility], [address] [place], [place] [address]   Respiratory Panel by PCR     Status: Abnormal   Collection Time: 02/14/19  8:08 AM  Result Value Ref Range Status   Adenovirus NOT DETECTED NOT DETECTED Final   Coronavirus 229E NOT DETECTED NOT DETECTED Final    Comment: (NOTE) The Coronavirus on the Respiratory Panel, DOES NOT test for the novel  Coronavirus (2019 nCoV)    Coronavirus HKU1 NOT DETECTED NOT DETECTED Final   Coronavirus NL63 NOT DETECTED NOT DETECTED Final   Coronavirus OC43 NOT DETECTED NOT DETECTED Final   Metapneumovirus NOT DETECTED NOT DETECTED Final   Rhinovirus / Enterovirus DETECTED (A) NOT DETECTED Final   Influenza A NOT DETECTED NOT DETECTED Final   Influenza B NOT DETECTED NOT DETECTED Final   Parainfluenza Virus 1 NOT DETECTED NOT DETECTED Final   Parainfluenza Virus 2 NOT DETECTED NOT DETECTED Final   Parainfluenza Virus 3 NOT DETECTED NOT DETECTED Final   Parainfluenza Virus 4 NOT DETECTED NOT DETECTED Final   Respiratory Syncytial Virus NOT DETECTED NOT DETECTED Final   Bordetella pertussis NOT DETECTED NOT DETECTED Final   Chlamydophila pneumoniae NOT DETECTED NOT DETECTED Final   Mycoplasma pneumoniae NOT DETECTED NOT DETECTED Final    Comment: Performed at [facility], [address] [place], [place] [address]         Radiology Studies: No results found.      Scheduled Meds:  aspirin EC  81 mg Oral Daily   Chlorhexidine Gluconate Cloth  6 each Topical Q0600   collagenase   Topical Daily   enoxaparin (LOVENOX) injection  40 mg Subcutaneous Q24H   insulin aspart  0-5 Units Subcutaneous QHS   insulin aspart  0-9 Units Subcutaneous TID WC    insulin glargine  10 Units Subcutaneous BID   metoprolol tartrate  2.5 mg Intravenous Q6H   mupirocin ointment  1 application Nasal BID   pantoprazole (PROTONIX) IV  40 mg Intravenous Q24H   phenytoin (DILANTIN) IV  100 mg Intravenous BID   Continuous Infusions:  sodium chloride 125 mL/hr at 02/16/19 0400   ceFEPime (MAXIPIME) IV 2 g (02/16/19 1004)   levETIRAcetam 500 mg (02/16/19 0946)   vancomycin 1,000 mg (02/16/19 1007)     LOS: 3 days    Time spent: 83    Burke Keels, MD [facility]  If 7PM-7AM, please contact night-coverage  02/16/2019, 12:39 PM

## 2019-02-16 NOTE — Plan of Care (Signed)
  Problem: Clinical Measurements: Goal: Will remain free from infection Outcome: Progressing Note:  Pt has shown no new signs of infection during my care.    Problem: Pain Managment: Goal: General experience of comfort will improve Outcome: Progressing Note:  Pt has had no complaints of pain during my shift.    Problem: Safety: Goal: Ability to remain free from injury will improve Outcome: Progressing Note:  Pt has remained free from falls during my care.

## 2019-02-17 ENCOUNTER — Inpatient Hospital Stay: Payer: Self-pay

## 2019-02-17 LAB — BASIC METABOLIC PANEL
Anion gap: 8 (ref 5–15)
BUN: 9 mg/dL (ref 8–23)
CO2: 25 mmol/L (ref 22–32)
Calcium: 8.1 mg/dL — ABNORMAL LOW (ref 8.9–10.3)
Chloride: 103 mmol/L (ref 98–111)
Creatinine, Ser: 0.67 mg/dL (ref 0.61–1.24)
GFR calc Af Amer: >60 mL/min (ref >60)
GFR calc non Af Amer: >60 mL/min (ref >60)
Glucose, Bld: 131 mg/dL — ABNORMAL HIGH (ref 70–99)
Potassium: 2.9 mmol/L — ABNORMAL LOW (ref 3.5–5.1)
Sodium: 136 mmol/L (ref 135–145)

## 2019-02-17 LAB — CBC WITH DIFFERENTIAL/PLATELET
Abs Immature Granulocytes: 0.14 10*3/uL — ABNORMAL HIGH (ref 0.00–0.07)
Basophils Absolute: 0.1 10*3/uL (ref 0.0–0.1)
Basophils Relative: 0 %
Eosinophils Absolute: 1.2 10*3/uL — ABNORMAL HIGH (ref 0.0–0.5)
Eosinophils Relative: 9 %
HCT: 29.1 % — ABNORMAL LOW (ref 39.0–52.0)
Hemoglobin: 10 g/dL — ABNORMAL LOW (ref 13.0–17.0)
Immature Granulocytes: 1 %
Lymphocytes Relative: 14 %
Lymphs Abs: 2 10*3/uL (ref 0.7–4.0)
MCH: 31.5 pg (ref 26.0–34.0)
MCHC: 34.4 g/dL (ref 30.0–36.0)
MCV: 91.8 fL (ref 80.0–100.0)
Monocytes Absolute: 1.1 10*3/uL — ABNORMAL HIGH (ref 0.1–1.0)
Monocytes Relative: 8 %
Neutro Abs: 9.5 10*3/uL — ABNORMAL HIGH (ref 1.7–7.7)
Neutrophils Relative %: 68 %
Platelets: 318 10*3/uL (ref 150–400)
RBC: 3.17 MIL/uL — ABNORMAL LOW (ref 4.22–5.81)
RDW: 12.7 % (ref 11.5–15.5)
WBC: 14.1 10*3/uL — ABNORMAL HIGH (ref 4.0–10.5)
nRBC: 0 % (ref 0.0–0.2)

## 2019-02-17 LAB — PHENYTOIN LEVEL, TOTAL: Phenytoin Lvl: 9.2 ug/mL — ABNORMAL LOW (ref 10.0–20.0)

## 2019-02-17 LAB — GLUCOSE, CAPILLARY
Glucose-Capillary: 176 mg/dL — ABNORMAL HIGH (ref 70–99)
Glucose-Capillary: 130 mg/dL — ABNORMAL HIGH (ref 70–99)
Glucose-Capillary: 152 mg/dL — ABNORMAL HIGH (ref 70–99)
Glucose-Capillary: 124 mg/dL — ABNORMAL HIGH (ref 70–99)
Glucose-Capillary: 192 mg/dL — ABNORMAL HIGH (ref 70–99)

## 2019-02-17 LAB — NOVEL CORONAVIRUS, NAA (HOSP ORDER, SEND-OUT TO REF LAB; TAT 18-24 HRS): SARS-CoV-2, NAA: NOT DETECTED

## 2019-02-17 MED ORDER — ENSURE ENLIVE PO LIQD
237.0000 mL | Freq: Three times a day (TID) | ORAL | Status: DC
  Administered 2019-02-17 – 2019-02-18 (×3): 237 mL via ORAL

## 2019-02-17 MED ORDER — SODIUM CHLORIDE 0.9% FLUSH
10.0000 mL | Freq: Two times a day (BID) | INTRAVENOUS | Status: DC
  Administered 2019-02-17 – 2019-02-23 (×9): 10 mL

## 2019-02-17 MED ORDER — POTASSIUM CHLORIDE 10 MEQ/100ML IV SOLN
10.0000 meq | INTRAVENOUS | Status: AC
  Administered 2019-02-17 (×6): 10 meq via INTRAVENOUS
  Filled 2019-02-17 (×5): qty 100

## 2019-02-17 MED ORDER — ADULT MULTIVITAMIN W/MINERALS CH
1.0000 | ORAL_TABLET | Freq: Every day | ORAL | Status: DC
  Administered 2019-02-18 – 2019-02-24 (×6): 1 via ORAL
  Filled 2019-02-17 (×6): qty 1

## 2019-02-17 MED ORDER — POTASSIUM CHLORIDE 10 MEQ/100ML IV SOLN
INTRAVENOUS | Status: AC
  Filled 2019-02-17: qty 100

## 2019-02-17 MED ORDER — SODIUM CHLORIDE 0.9% FLUSH
10.0000 mL | INTRAVENOUS | Status: DC | PRN
  Administered 2019-02-19 – 2019-02-22 (×2): 10 mL
  Filled 2019-02-17 (×2): qty 40

## 2019-02-17 MED ORDER — RESOURCE THICKENUP CLEAR PO POWD
ORAL | Status: DC | PRN
  Administered 2019-02-17: 17:00:00 via ORAL
  Filled 2019-02-17: qty 125

## 2019-02-17 NOTE — Progress Notes (Signed)
Spoke with RN and recommended a PICC line or Central line for the patient. The patient currently has 1 PIV that is working, Patient has had 1 PIV to infiltrate. The patient will be receiving potassium runs of 6, vancomycin, along with keppra and another antibiotic and 45% saline. RN stated she would call the MD

## 2019-02-17 NOTE — Progress Notes (Addendum)
Nutrition Follow-up  RD working remotely.  DOCUMENTATION CODES:   Not applicable  INTERVENTION:   -Ensure Enlive po TID, each supplement provides 350 kcal and 20 grams of protein -Magic Cup BID with meals, each supplement provides 290 kcals and 9 grams protein -MVI with minerals daily -Feeding assistance with meals  NUTRITION DIAGNOSIS:   Increased nutrient needs related to wound healing as evidenced by estimated needs.  Ongoing  GOAL:   Patient will meet greater than or equal to 90% of their needs  Progressing  MONITOR:   Diet advancement, PO intake, Supplement acceptance, Labs, Weight trends, Skin, I & O's  REASON FOR ASSESSMENT:   Low Braden    ASSESSMENT:   Arthur Gerrior Appleis a 73 y.o.malewith medical history significant oftype 2 diabetes on insulin and metformin, dementia, long-term nursing home resident, hypertension, history of seizure who is brought to the emergency room with altered mental status. Patient is currently altered and unable to provide any history. Discussed with patient's brother who had seen him 3 weeks ago before stay home order and had no complaints. At baseline, patient is total dependent and is risk of fall on attempted ambulation. He has some behavior disturbances due to cognitive dysfunction as per his brother.  4/6- s/p BSE- advanced to dysphagia 1 diet with honey thick liquids  Per MD notes, COVID test negative as of 02/17/19.   Pt with poor appetite; noted 25-75% meal completion. Prior to being transferred to floor, pt was refusing to eat. MD spoke with family in regards to goals of care and they are refusing palliative care consult at this time.   Per SLP notes, pt may be able to upgrade diet if his cognition improves.   Pt with poor oral intake and would benefit from nutrient dense supplement. One Ensure Enlive supplement provides 350 kcals, 20 grams protein, and 44-45 grams of carbohydrate vs one Glucerna shake supplement, which  provides 220 kcals, 10 grams of protein, and 26 grams of carbohydrate. Given pt's hx of DM, RD will continue to monitor PO intake, CBGS, and adjust supplement regimen as appropriate.   Labs reviewed: K: 2.9, CBGS: 130-202 (inpatient orders for glycemic control are 0-5 units insulin aspart q HS, 0-9 units insulin aspart TID with meals, and 10 units insulin glargine BID).   Diet Order:   Diet Order            DIET - DYS 1 Room service appropriate? Yes; Fluid consistency: Honey Thick  Diet effective now              EDUCATION NEEDS:   Not appropriate for education at this time  Skin:  Skin Assessment: Skin Integrity Issues: Skin Integrity Issues:: Unstageable Unstageable: lt hip  Last BM:  Unknown  Height:   Ht Readings from Last 1 Encounters:  02/13/19 5\' 9"  (1.753 m)    Weight:   Wt Readings from Last 1 Encounters:  02/16/19 83.3 kg    Ideal Body Weight:  72.7 kg  BMI:  Body mass index is 27.12 kg/m.  Estimated Nutritional Needs:   Kcal:  2100-2300  Protein:  110-125 grams  Fluid:  > 2.1 L    Arthur Berry, RD, LDN, CDCES Registered Dietitian II Certified Diabetes Care and Education Specialist Pager: [phone] After hours Pager: [phone]

## 2019-02-17 NOTE — Care Management Important Message (Signed)
Important Message  Patient Details  Name: MIGUELITO SCHNAIBLE MRN: [id number] Date of Birth: 07-25-1946   Medicare Important Message Given:  Yes  Letter emailed to pt's brother at [email or web address] per pt's brother's request.  Maree Krabbe, LCSW 02/17/2019, 10:07 AM

## 2019-02-17 NOTE — Progress Notes (Signed)
Peripherally Inserted Central Catheter/Midline Placement  The IV Nurse has discussed with the patient and/or persons authorized to consent for the patient, the purpose of this procedure and the potential benefits and risks involved with this procedure.  The benefits include less needle sticks, lab draws from the catheter, and the patient may be discharged home with the catheter. Risks include, but not limited to, infection, bleeding, blood clot (thrombus formation), and puncture of an artery; nerve damage and irregular heartbeat and possibility to perform a PICC exchange if needed/ordered by physician.  Alternatives to this procedure were also discussed.  Bard Power PICC patient education guide, fact sheet on infection prevention and patient information card has been provided to patient /or left at bedside.    PICC/Midline Placement Documentation  PICC Double Lumen 02/17/19 PICC Left Basilic 48 cm 0 cm (Active)  Indication for Insertion or Continuance of Line Poor Vasculature-patient has had multiple peripheral attempts or PIVs lasting less than 24 hours 02/17/2019  9:10 PM  Exposed Catheter (cm) 0 cm 02/17/2019  9:10 PM  Site Assessment Clean;Dry;Intact 02/17/2019  9:10 PM  Lumen #1 Status Flushed;Saline locked;Blood return noted 02/17/2019  9:10 PM  Lumen #2 Status Flushed;Saline locked;Blood return noted 02/17/2019  9:10 PM  Dressing Type Transparent 02/17/2019  9:10 PM  Dressing Status Clean;Dry;Intact;Antimicrobial disc in place 02/17/2019  9:10 PM  Dressing Change Due 02/24/19 02/17/2019  9:10 PM       Ethelda Chick 02/17/2019, 9:11 PM

## 2019-02-17 NOTE — Progress Notes (Signed)
IV team stated pt has had 3 IV's and will prob need a PICC line. Will page MD.

## 2019-02-17 NOTE — Progress Notes (Addendum)
PROGRESS NOTE    Arthur Berry  EAV:[id number] DOB: 15-Feb-1946 DOA: 02/13/2019 PCP: Zeb Comfort   Brief Narrative:  Arthur Berry a 73 y.o.malewith medical history significant oftype 2 diabetes on insulin and metformin, dementia, long-term nursing home resident, hypertension, history of seizure who is brought to the emergency room with altered mental status. Patient is currently altered and unable to provide any history. Discussed with patient's brother who had seen him 3 weeks ago before stay home order and had no complaints. At baseline, patient is total dependent and is risk of fall on attempted ambulation. He has some behavior disturbances due to cognitive dysfunction as per his brother.  While in the hospital patient had a respiratory virus panel which showed positive rhinovirus.  I contacted [facility] who gave verbal reports that the test was negative I have asked for faxed confirmation prior to moving the patient off the unit and discontinuing droplet and contact precautions.  Unfortunately we never did receive that.  Patient's COVID-19 testing came back as negative 4/6.  Will be moved off the floor.  Family discussion: I did discuss plan of care with both brother and sister.  Sister who is the healthcare power of attorney does not want patient going back to [facility]. She also declined palliative care consult at this time. I reinitiated PT, OT, speech for further eval.   Assessment & Plan:   Principal Problem:   Sepsis (HCC) Active Problems:   Diabetes mellitus without complication (HCC)   HLD (hyperlipidemia)   Essential hypertension, benign   GERD (gastroesophageal reflux disease)   CVA (cerebral vascular accident) (HCC)   Epilepsy (HCC)   Acute metabolic encephalopathy   Dehydration with hypernatremia   Altered mental status   Pressure injury of skin   Sepsiswith possible rhinovirus.Present on admission.  As noted by admitting physician,  chest x-ray not very impressive patient does not have much pulmonary symptoms. Urinalysis is normal. Due to severity of presentation, willcontinue totreat him as sepsis with unknown source. Patient received vancomycin and cefepime in the ER,I will alsocontinue until clinical improvement blood cultures remain negative urine culture no growth Patient now COVID-19 negative, consult general surgery for possible debridement right hip  PossibleCOVID. COVID-19 negative, place PT, OT, speech.  Hypernatremia with dehydration:This has corrected with appropriate fluid resuscitation.  Type 2 diabetes with uncontrolled blood sugars: Currently unreliable oral intake,keep on sliding scale insulin, check bg achs, patient refusing to eat discussed palliative care with family who declined palliative care consult at this time  Dementia with history of a stroke and seizure: CT head with no new findings. Patient is on Keppra and phenytoin for seizure and no reported seizures recently.  To new IV until patient takes p.o.  Hypertension:Initially blood pressure meds were held secondary concerns for risk of sepsis and hypotension. Patient blood pressure is increasing. I will continue IV medications for now secondary to nursing concern and family concern of impaired swallowing and risk of aspiration as well as patient declining p.o. intake  DysphaGia.Marland Kitchenappreciate speech eval and recs  Skin pressure injury left hip. Wound care consult placed, image of wound placed in April 3 progress note, general surgery consult today-paged awaitng call back, order placed in EPIC  DVT prophylaxis: Lovenox SQ  Code Status: DNR status confirmed with brother and sister    Code Status Orders  (From admission, onward)         Start     Ordered   02/13/19 1956  Full code  Continuous     02/13/19 1955        Code Status History    Date Active Date Inactive Code Status Order ID Comments User Context   02/13/2019  1318 02/13/2019 1955 DNR [id number]  Dorcas Carrow, MD ED   11/01/2018 1637 11/07/2018 2104 Full Code [id number]  Lanae Boast, MD Inpatient   10/23/2017 0522 10/27/2017 1905 Full Code [id number]  Eduard Clos, MD ED   05/13/2017 2249 05/14/2017 1701 Full Code [id number]  Arthor Captain, PA-C ED   06/16/2015 0024 06/22/2015 1439 Full Code [id number]  Lorretta Harp, MD ED    Advance Directive Documentation     Most Recent Value  Type of Advance Directive  Out of facility DNR (pink MOST or yellow form)  Pre-existing out of facility DNR order (yellow form or pink MOST form)  -  "MOST" Form in Place?  -     Family Communication: Discussed with sister today, Disposition Plan:   Patient hadsignificant disease on presentation, sepsis, dehydration with hypernatremia. He willcontinue toneed inpatient treatment management and titration of medications,  IV antibioticswith frequent monitoring of electrolytes, supplemental O2 as well as PT, OT and speech eval for placement in alternate memory care unit.  Family indicated they do not want patient to return to current unit Consults called: General surgery Admission status: Inpatient   Consultants:   General surgery  Procedures:  Ct Head Wo Contrast  Result Date: 02/13/2019 CLINICAL DATA:  AMS- unclear causeLess responsive, double pneumonia 1 week ago EXAM: CT HEAD WITHOUT CONTRAST TECHNIQUE: Contiguous axial images were obtained from the base of the skull through the vertex without intravenous contrast. COMPARISON:  12/22/2027 and older exams. FINDINGS: Brain: There are no parenchymal masses, no evidence of a recent infarct and no intracranial hemorrhage. There are no extra-axial masses. Developmental anomalies including agenesis of the corpus callosum, large left sided interhemispheric cyst and ventriculomegaly are unchanged. White matter hypoattenuation consistent with chronic microvascular ischemic change is also stable. Vascular: No hyperdense vessel or  unexpected calcification. Skull: Normal. Negative for fracture or focal lesion. Sinuses/Orbits: Globes and orbits are unremarkable. There is mucosal thickening with dependent fluid in the left maxillary sphenoid and left frontal sinuses and posterior ethmoid air cells. Additional mucosal thickening is noted along the remaining ethmoid air cells and in the posterior right maxillary sinus. Many of the left mastoid air cells are opacified with fluid attenuation. Clear right mastoid air cells. Other: None. IMPRESSION: 1. No acute intracranial abnormalities. 2. Chronic changes from developmental anomalies and white matter changes consistent with chronic microvascular ischemic change, stable from the prior exam. 3. No significant sinus disease including air-fluid levels. Acute sinusitis should be considered in the proper clinical setting. Electronically Signed   By: Amie Portland M.D.   On: 02/13/2019 11:43   Dg Chest Port 1 View  Result Date: 02/13/2019 CLINICAL DATA:  Shortness of breath and fever. Altered mental status. EXAM: PORTABLE CHEST 1 VIEW COMPARISON:  11/01/2018 FINDINGS: Artifact overlies the chest. Heart size is normal. Mediastinal shadows show chronic mass-effect upon the trachea displacing it towards the right, most likely secondary to thyroid goiter. The lungs are clear by radiography. No consolidation or collapse. No effusions. IMPRESSION: No active disease. Electronically Signed   By: Paulina Fusi M.D.   On: 02/13/2019 10:53   [place] Ekg Site Rite  Result Date: 02/17/2019 If Site Rite image not attached, placement could not be confirmed due to current cardiac rhythm.    Antimicrobials:   VANC and  cefepime day 5, anticipate transition to p.o. antibiotics tomorrow pending general surgery eval,  of note patient was MRSA positive   Subjective: Patient still minimally interactive, staff indicates he is refusing p.o. intake.  Discussed this with family who did not want to pursue palliative care at  this point.  Patient will need PICC line for IV access, possible TPN, and IV meds  Objective: Vitals:   02/17/19 0800 02/17/19 0822 02/17/19 0900 02/17/19 1100  BP: (!) 161/62 (!) 161/62    Pulse: (!) 51  (!) 51 (!) 56  Resp: 19  17 (!) 0  Temp:   98.8 F (37.1 C)   TempSrc:  Oral Axillary   SpO2: 98%  96% 100%  Weight:      Height:        Intake/Output Summary (Last 24 hours) at 02/17/2019 1134 Last data filed at 02/17/2019 1021 Gross per 24 hour  Intake 4217.81 ml  Output 4250 ml  Net -32.19 ml   Filed Weights   02/14/19 0600 02/15/19 0300 02/16/19 0300  Weight: 78.2 kg 81.2 kg 83.3 kg    Examination:  General exam: Appears calm flat affect, minimally interactive Respiratory system mild rhonchi bilaterally, no accessory muscle use. Cardiovascular system: S1 & S2 heard, RRR. No JVD, murmurs, rubs, gallops or clicks. No pedal edema. Gastrointestinal system: Abdomen is nondistended, soft and nontender. No organomegaly or masses felt. Normal bowel sounds heard. Central nervous system: Alert no focal deficit identified, minimally interactive nonadherent to exam Extremities: Symmetric 5 x 5 power.  Contractures Skin: No rashes, lesions or ulcers Psychiatry: Judgement and insight are poor mood & affect FLAT    Data Reviewed: I have personally reviewed following labs and imaging studies  CBC: Recent Labs  Lab 02/13/19 0958 02/14/19 0659 02/15/19 0339 02/16/19 0916 02/17/19 0650  WBC 16.3* 13.6* 13.6* 13.3* 14.1*  NEUTROABS 12.9*  --  9.9* 9.0* 9.5*  HGB 12.2* 10.3* 10.2* 10.0* 10.0*  HCT 37.5* 32.6* 31.4* 30.3* 29.1*  MCV 96.6 95.6 96.6 94.1 91.8  PLT 371 307 330 312 318   Basic Metabolic Panel: Recent Labs  Lab 02/13/19 0958 02/14/19 0659 02/15/19 0339 02/16/19 0916 02/17/19 0650  NA 149* 148* 148* 142 136  K 3.8 3.2* 3.7 3.2* 2.9*  CL 114* 116* 118* 111 103  CO2 23 23 25 25 25   GLUCOSE 323* 229* 281* 181* 131*  BUN 40* 30* 20 12 9   CREATININE 1.35* 0.91  0.90 0.78 0.67  CALCIUM 9.0 8.4* 8.5* 8.5* 8.1*   GFR: Estimated Creatinine Clearance: 82.2 mL/min (by C-G formula based on SCr of 0.67 mg/dL). Liver Function Tests: Recent Labs  Lab 02/13/19 0958  AST 41  ALT 43  ALKPHOS 108  BILITOT 0.8  PROT 6.5  ALBUMIN 2.2*   No results for input(s): LIPASE, AMYLASE in the last 168 hours. No results for input(s): AMMONIA in the last 168 hours. Coagulation Profile: No results for input(s): INR, PROTIME in the last 168 hours. Cardiac Enzymes: Recent Labs  Lab 02/13/19 0958  TROPONINI <0.03   BNP (last 3 results) No results for input(s): PROBNP in the last 8760 hours. HbA1C: No results for input(s): HGBA1C in the last 72 hours. CBG: Recent Labs  Lab 02/16/19 0744 02/16/19 1238 02/16/19 1706 02/16/19 2123 02/17/19 0815  GLUCAP 144* 176* 130* 202* 130*   Lipid Profile: No results for input(s): CHOL, HDL, LDLCALC, TRIG, CHOLHDL, LDLDIRECT in the last 72 hours. Thyroid Function Tests: No results for input(s): TSH, T4TOTAL, FREET4,  T3FREE, THYROIDAB in the last 72 hours. Anemia Panel: No results for input(s): VITAMINB12, FOLATE, FERRITIN, TIBC, IRON, RETICCTPCT in the last 72 hours. Sepsis Labs: Recent Labs  Lab 02/13/19 0958 02/13/19 1445  LATICACIDVEN 1.7 1.2    Recent Results (from the past 240 hour(s))  Blood Culture (routine x 2)     Status: None (Preliminary result)   Collection Time: 02/13/19 10:02 AM  Result Value Ref Range Status   Specimen Description BLOOD RIGHT ANTECUBITAL  Final   Special Requests   Final    BOTTLES DRAWN AEROBIC AND ANAEROBIC Blood Culture adequate volume   Culture   Final    NO GROWTH 4 DAYS Performed at [facility], [address] [place], [place] [address]    Report Status PENDING  Incomplete  Blood Culture (routine x 2)     Status: None (Preliminary result)   Collection Time: 02/13/19 10:30 AM  Result Value Ref Range Status   Specimen Description BLOOD LEFT HAND  Final    Special Requests   Final    BOTTLES DRAWN AEROBIC ONLY Blood Culture results may not be optimal due to an inadequate volume of blood received in culture bottles   Culture   Final    NO GROWTH 4 DAYS Performed at [facility], [address] [place], [place] [address]    Report Status PENDING  Incomplete  Urine culture     Status: None   Collection Time: 02/13/19 10:30 AM  Result Value Ref Range Status   Specimen Description URINE, CLEAN CATCH  Final   Special Requests NONE  Final   Culture   Final    NO GROWTH Performed at [facility], [address] [place], [place] [address]    Report Status 02/14/2019 FINAL  Final  Novel Coronavirus, NAA (hospital order; send-out to ref lab)     Status: None   Collection Time: 02/13/19  6:30 PM  Result Value Ref Range Status   SARS-CoV-2, NAA NOT DETECTED NOT DETECTED Final    Comment: Negative (Not Detected) results do not exclude infection caused by SARS CoV 2 and should not be used as the sole basis for treatment or other patient management decisions. Optimum specimen types and timing for peak viral levels during infections caused  by SARS CoV 2 have not been determined. Collection of multiple specimens (types and time points) from the same patient may be necessary to detect the virus. Improper specimen collection and handling, sequence variability underlying assay primers and or probes, or the presence of organisms in  quantities less than the limit of detection of the assay may lead to false negative results. Positive and negative predictive values of testing are highly dependent on prevalence. False negative results are more likely when prevalence of disease is high. (NOTE) The expected result is Negative (Not Detected). The SARS CoV 2 test is intended for the presumptive qualitative  detection of nucleic acid from SARS CoV 2 in upper and lower  respir atory specimens. Testing methodology is real time RT PCR. Test results must  be correlated with clinical presentation and  evaluated in the context of other laboratory and epidemiologic data.  Test performance can be affected because the epidemiology and  clinical spectrum of infection caused by SARS CoV 2 is not fully  known. For example, the optimum types of specimens to collect and  when during the course of infection these specimens are most likely  to contain detectable viral RNA  may not be known. This test has not been Food and Drug Administration (FDA) cleared or  approved and has been authorized by FDA under an Emergency Use  Authorization (EUA). The test is only authorized for the duration of  the declaration that circumstances exist justifying the authorization  of emergency use of in vitro diagnostic tests for detection and or  diagnosis of SARS CoV 2 under Section 564(b)(1) of the Act, 21 U.S.C.  section [phone] 3(b)(1), unless the authorization is terminated or   revoked sooner. [facility] is certified under the  Clinical Laboratory Improvement Amendments of 1988 (CLIA), 42 U.S.C.  section [phone], to perform high complexity tests. Performed at [facility] CLIA [id number] [address], [address], [place], [place] [address] Laboratory Director: Turner Daniels, MD    Coronavirus Source NASOPHARYNGEAL  Final    Comment: Performed at [facility], [address] [place], [place] [address]  MRSA PCR Screening     Status: Abnormal   Collection Time: 02/13/19  8:55 PM  Result Value Ref Range Status   MRSA by PCR POSITIVE (A) NEGATIVE Final    Comment:        The GeneXpert MRSA Assay (FDA approved for NASAL specimens only), is one component of a comprehensive MRSA colonization surveillance program. It is not intended to diagnose MRSA infection nor to guide or monitor treatment for MRSA infections. RESULT CALLED TO, READ BACK BY AND VERIFIED WITHGavin Potters RN 02/13/19 2337 JDW Performed at [facility], [address] [place], [place] [address]   Respiratory Panel by PCR     Status: Abnormal   Collection Time: 02/14/19  8:08 AM  Result Value Ref Range Status   Adenovirus NOT DETECTED NOT DETECTED Final   Coronavirus 229E NOT DETECTED NOT DETECTED Final    Comment: (NOTE) The Coronavirus on the Respiratory Panel, DOES NOT test for the novel  Coronavirus (2019 nCoV)    Coronavirus HKU1 NOT DETECTED NOT DETECTED Final   Coronavirus NL63 NOT DETECTED NOT DETECTED Final   Coronavirus OC43 NOT DETECTED NOT DETECTED Final   Metapneumovirus NOT DETECTED NOT DETECTED Final   Rhinovirus / Enterovirus DETECTED (A) NOT DETECTED Final   Influenza A NOT DETECTED NOT DETECTED Final   Influenza B NOT DETECTED NOT DETECTED Final   Parainfluenza Virus 1 NOT DETECTED NOT DETECTED Final   Parainfluenza Virus 2 NOT DETECTED NOT DETECTED Final   Parainfluenza Virus 3 NOT DETECTED NOT DETECTED Final   Parainfluenza Virus 4 NOT DETECTED NOT DETECTED Final   Respiratory Syncytial Virus NOT DETECTED NOT DETECTED Final   Bordetella pertussis NOT DETECTED NOT DETECTED Final   Chlamydophila pneumoniae NOT DETECTED NOT DETECTED Final   Mycoplasma pneumoniae NOT DETECTED NOT DETECTED Final    Comment: Performed at [facility], [address], [place], [place] [address]         Radiology Studies: [place] Ekg Site Rite  Result Date: 02/17/2019 If [facility] image not attached, placement could not be confirmed due to current cardiac rhythm.       Scheduled Meds: . aspirin EC  81 mg Oral Daily  . Chlorhexidine Gluconate Cloth  6 each Topical Q0600  . collagenase   Topical Daily  . enoxaparin (LOVENOX) injection  40 mg Subcutaneous Q24H  . insulin aspart  0-5 Units Subcutaneous QHS  . insulin aspart  0-9 Units Subcutaneous TID WC  . insulin glargine  10 Units  Subcutaneous BID  . metoprolol tartrate  2.5 mg Intravenous Q6H  . mupirocin ointment  1 application Nasal BID  . pantoprazole  (PROTONIX) IV  40 mg Intravenous Q24H  . phenytoin (DILANTIN) IV  100 mg Intravenous BID   Continuous Infusions: . sodium chloride 75 mL/hr at 02/17/19 1115  . ceFEPime (MAXIPIME) IV 2 g (02/17/19 0922)  . levETIRAcetam 500 mg (02/17/19 0901)  . potassium chloride 10 mEq (02/17/19 1112)  . vancomycin 750 mg (02/17/19 0924)     LOS: 4 days    Time spent: 35 min    Burke Keels, MD [facility]  If 7PM-7AM, please contact night-coverage  02/17/2019, 11:34 AM

## 2019-02-17 NOTE — Progress Notes (Addendum)
Pt not eating per Night shift RN report. Attempted to feed pt this am. Pt refused stating "i'm not hungry". PT HR is 56. Held Metoprolol. Will notify MD of same.   Edit: notified MD. Ok to hold with HR at 55. Notified MD of need for possible PICC access. MD states he would like to speak with family regarding plan of care. MD also notified and states that he will change pt's code status to reflect DNR status. Gold DNR on pt's chart.

## 2019-02-17 NOTE — Progress Notes (Signed)
Noted right AC PIV is red and streaking up right arm to right shoulder.  Area is red, hot and swollen.  PIV removed and replaced in left upper arm.  Right arm elevated on 2 pillows.  Patient states he has no pain in the arm.

## 2019-02-18 DIAGNOSIS — R531 Weakness: Secondary | ICD-10-CM

## 2019-02-18 DIAGNOSIS — E876 Hypokalemia: Secondary | ICD-10-CM

## 2019-02-18 DIAGNOSIS — N179 Acute kidney failure, unspecified: Secondary | ICD-10-CM

## 2019-02-18 LAB — CBC WITH DIFFERENTIAL/PLATELET
Abs Immature Granulocytes: 0.18 10*3/uL — ABNORMAL HIGH (ref 0.00–0.07)
Basophils Absolute: 0.1 10*3/uL (ref 0.0–0.1)
Basophils Relative: 1 %
Eosinophils Absolute: 1 10*3/uL — ABNORMAL HIGH (ref 0.0–0.5)
Eosinophils Relative: 8 %
HCT: 31.2 % — ABNORMAL LOW (ref 39.0–52.0)
Hemoglobin: 10.3 g/dL — ABNORMAL LOW (ref 13.0–17.0)
Immature Granulocytes: 1 %
Lymphocytes Relative: 16 %
Lymphs Abs: 2 10*3/uL (ref 0.7–4.0)
MCH: 30.1 pg (ref 26.0–34.0)
MCHC: 33 g/dL (ref 30.0–36.0)
MCV: 91.2 fL (ref 80.0–100.0)
Monocytes Absolute: 0.9 10*3/uL (ref 0.1–1.0)
Monocytes Relative: 7 %
Neutro Abs: 8.3 10*3/uL — ABNORMAL HIGH (ref 1.7–7.7)
Neutrophils Relative %: 67 %
Platelets: 370 10*3/uL (ref 150–400)
RBC: 3.42 MIL/uL — ABNORMAL LOW (ref 4.22–5.81)
RDW: 12.9 % (ref 11.5–15.5)
WBC: 12.5 10*3/uL — ABNORMAL HIGH (ref 4.0–10.5)
nRBC: 0 % (ref 0.0–0.2)

## 2019-02-18 LAB — CULTURE, BLOOD (ROUTINE X 2)
Culture: NO GROWTH
Culture: NO GROWTH
Special Requests: ADEQUATE

## 2019-02-18 LAB — BASIC METABOLIC PANEL
Anion gap: 8 (ref 5–15)
BUN: 6 mg/dL — ABNORMAL LOW (ref 8–23)
CO2: 27 mmol/L (ref 22–32)
Calcium: 8.2 mg/dL — ABNORMAL LOW (ref 8.9–10.3)
Chloride: 103 mmol/L (ref 98–111)
Creatinine, Ser: 0.64 mg/dL (ref 0.61–1.24)
GFR calc Af Amer: >60 mL/min (ref >60)
GFR calc non Af Amer: >60 mL/min (ref >60)
Glucose, Bld: 176 mg/dL — ABNORMAL HIGH (ref 70–99)
Potassium: 3 mmol/L — ABNORMAL LOW (ref 3.5–5.1)
Sodium: 138 mmol/L (ref 135–145)

## 2019-02-18 LAB — GLUCOSE, CAPILLARY
Glucose-Capillary: 159 mg/dL — ABNORMAL HIGH (ref 70–99)
Glucose-Capillary: 248 mg/dL — ABNORMAL HIGH (ref 70–99)
Glucose-Capillary: 298 mg/dL — ABNORMAL HIGH (ref 70–99)
Glucose-Capillary: 326 mg/dL — ABNORMAL HIGH (ref 70–99)

## 2019-02-18 LAB — MAGNESIUM: Magnesium: 1.4 mg/dL — ABNORMAL LOW (ref 1.7–2.4)

## 2019-02-18 MED ORDER — POTASSIUM CHLORIDE CRYS ER 20 MEQ PO TBCR
40.0000 meq | EXTENDED_RELEASE_TABLET | ORAL | Status: AC
  Administered 2019-02-18: 40 meq via ORAL
  Filled 2019-02-18: qty 2

## 2019-02-18 MED ORDER — LISINOPRIL 20 MG PO TABS
20.0000 mg | ORAL_TABLET | Freq: Every day | ORAL | Status: DC
  Administered 2019-02-18 – 2019-02-24 (×6): 20 mg via ORAL
  Filled 2019-02-18 (×6): qty 1

## 2019-02-18 MED ORDER — POTASSIUM CHLORIDE 10 MEQ/100ML IV SOLN
10.0000 meq | INTRAVENOUS | Status: AC
  Administered 2019-02-18 (×4): 10 meq via INTRAVENOUS
  Filled 2019-02-18 (×4): qty 100

## 2019-02-18 MED ORDER — AMLODIPINE BESYLATE 2.5 MG PO TABS
2.5000 mg | ORAL_TABLET | Freq: Every day | ORAL | Status: DC
  Administered 2019-02-18 – 2019-02-24 (×6): 2.5 mg via ORAL
  Filled 2019-02-18 (×6): qty 1

## 2019-02-18 MED ORDER — MAGNESIUM SULFATE 2 GM/50ML IV SOLN
2.0000 g | Freq: Once | INTRAVENOUS | Status: AC
  Administered 2019-02-18: 2 g via INTRAVENOUS
  Filled 2019-02-18: qty 50

## 2019-02-18 MED ORDER — CARVEDILOL 3.125 MG PO TABS
3.1250 mg | ORAL_TABLET | Freq: Two times a day (BID) | ORAL | Status: DC
  Administered 2019-02-18 – 2019-02-24 (×10): 3.125 mg via ORAL
  Filled 2019-02-18 (×11): qty 1

## 2019-02-18 NOTE — Progress Notes (Addendum)
PROGRESS NOTE    Arthur Berry   WUJ:[id number]  DOB: 1946-10-13  DOA: 02/13/2019 PCP: Zeb Comfort   Brief Narrative:  Arthur Berry  is a 73 y.o. male with medical history significant of type 2 diabetes on insulin and metformin, dementia with behavioral issues, HTN, seizure disorder, long-term nursing home resident, hypertension, history of seizure who is brought to the emergency room with altered mental status. At baseline, patient is total care and is risk of fall on attempted ambulation.  He has some behavior disturbances due to cognitive dysfunction as per his brother.  in ED : temp 100.2, WBC 16.3, RR was up to 23, BUN 40, Cr 1.35, Glucose 323, Sodium 149, CXR showed clear lungs.   4/3- Metapneumovirus +   The patient was transferred to my service today  Subjective: No complaints- is confused.    Assessment & Plan:   Principal Problem:   Sepsis due to meta pneumovirus - blood cultures, UA and Urine culture neg - no infiltrates on CXR - not hypoxic - leukocytosis improving - d/c Vanc and Cefepime today  Active Problems:   Dehydration with hypernatremia - has resolved with IV hydration    Acute metabolic encephalopathy with underlying dementia - due to infection and electrolyte abnormalities - Lexapro, Galantamine, Remeron, Risperdal and Ativan are on hold  Hypokalemia - replacing- he spit out the oral KCL I ordered and therefore, I have had to order it IV through his PICC line - Mg replaced vit PICC as well    Diabetes mellitus without complication  - on Lantus and Novolog     Essential hypertension, benign - resume Lisinopril Amlodipine and Coreg today - d/c IV Lopressor today    GERD (gastroesophageal reflux disease) - on Protonix daily    CVA (cerebral vascular accident)  - on Aspirin as outpt    Epilepsy (HCC) -cont Keppra and Depakote  Poor oral intake - per RN, he only had 25% on his breakfast- cont slow IVF and supplemental shakes   Dysphagia - on pureed with nectar thick liquids     Pressure injury of skin  - left hip necrotic ulcer left hip- follow wound care recommendations   Time spent in minutes: 45- considerable amount of time spent in reviewing chart DVT prophylaxis: Lovneox Code Status: DNR Family Communication: none Disposition Plan:  Needs re-eval by PT/OT before he can go to SNF- I have ordered PT and OT today, need to see if a new SNF can be found for him, multiple oral meds resumed today as well F/u K and Mg tomorrow I feel his prognosis is poor- he is DNR but not comfort care- his sister (POA) declines palliative care consult Consultants:   none Procedures:   none Antimicrobials:  Anti-infectives (From admission, onward)   Start     Dose/Rate Route Frequency Ordered Stop   02/17/19 1000  vancomycin (VANCOCIN) IVPB 750 mg/150 ml premix     750 mg 150 mL/hr over 60 Minutes Intravenous Every 12 hours 02/16/19 2233     02/14/19 1000  ceFEPIme (MAXIPIME) 2 g in sodium chloride 0.9 % 100 mL IVPB  Status:  Discontinued     2 g 200 mL/hr over 30 Minutes Intravenous Every 24 hours 02/13/19 1214 02/14/19 0848   02/14/19 1000  vancomycin (VANCOCIN) 1,250 mg in sodium chloride 0.9 % 250 mL IVPB  Status:  Discontinued     1,250 mg 166.7 mL/hr over 90 Minutes Intravenous Every 24 hours 02/13/19 1214 02/14/19 0854  02/14/19 1000  ceFEPIme (MAXIPIME) 2 g in sodium chloride 0.9 % 100 mL IVPB     2 g 200 mL/hr over 30 Minutes Intravenous Every 12 hours 02/14/19 0848     02/14/19 1000  vancomycin (VANCOCIN) 1,000 mg in sodium chloride 0.9 % 250 mL IVPB  Status:  Discontinued     1,000 mg 250 mL/hr over 60 Minutes Intravenous Every 12 hours 02/14/19 0854 02/16/19 2233   02/13/19 1000  vancomycin (VANCOCIN) IVPB 1000 mg/200 mL premix  Status:  Discontinued     1,000 mg 200 mL/hr over 60 Minutes Intravenous  Once 02/13/19 0956 02/13/19 0959   02/13/19 1000  ceFEPIme (MAXIPIME) 2 g in sodium chloride 0.9 % 100  mL IVPB     2 g 200 mL/hr over 30 Minutes Intravenous  Once 02/13/19 0956 02/13/19 1106   02/13/19 1000  vancomycin (VANCOCIN) 1,500 mg in sodium chloride 0.9 % 500 mL IVPB     1,500 mg 250 mL/hr over 120 Minutes Intravenous  Once 02/13/19 0959 02/15/19 0817       Objective: Vitals:   02/17/19 1500 02/17/19 2121 02/18/19 0453 02/18/19 0542  BP:  135/66 (!) 195/75   Pulse: (!) 51 62 68   Resp:  16 14   Temp:  98 F (36.7 C) 98.4 F (36.9 C)   TempSrc:  Oral Oral   SpO2:  100% 99%   Weight:    82.6 kg  Height:        Intake/Output Summary (Last 24 hours) at 02/18/2019 0827 Last data filed at 02/18/2019 0547 Gross per 24 hour  Intake 1585.58 ml  Output 4525 ml  Net -2939.42 ml   Filed Weights   02/15/19 0300 02/16/19 0300 02/18/19 0542  Weight: 81.2 kg 83.3 kg 82.6 kg    Examination: General exam: Appears comfortable - does not answer my questions HEENT: PERRLA, oral mucosa moist, no sclera icterus or thrush Respiratory system: Clear to auscultation. Respiratory effort normal. Cardiovascular system: S1 & S2 heard, RRR.   Gastrointestinal system: Abdomen soft, non-tender, nondistended. Normal bowel sounds. Central nervous system:  cannot assess neurological exam Extremities: No cyanosis, clubbing or edema Skin: No rashes or ulcers Psychiatry: unable to assess    Data Reviewed: I have personally reviewed following labs and imaging studies  CBC: Recent Labs  Lab 02/13/19 0958 02/14/19 0659 02/15/19 0339 02/16/19 0916 02/17/19 0650 02/18/19 0329  WBC 16.3* 13.6* 13.6* 13.3* 14.1* 12.5*  NEUTROABS 12.9*  --  9.9* 9.0* 9.5* 8.3*  HGB 12.2* 10.3* 10.2* 10.0* 10.0* 10.3*  HCT 37.5* 32.6* 31.4* 30.3* 29.1* 31.2*  MCV 96.6 95.6 96.6 94.1 91.8 91.2  PLT 371 307 330 312 318 370   Basic Metabolic Panel: Recent Labs  Lab 02/14/19 0659 02/15/19 0339 02/16/19 0916 02/17/19 0650 02/18/19 0329  NA 148* 148* 142 136 138  K 3.2* 3.7 3.2* 2.9* 3.0*  CL 116* 118* 111  103 103  CO2 GLUCOSE 229* 281* 181* 131* 176*  BUN 30* 6*  CREATININE 0.91 0.90 0.78 0.67 0.64  CALCIUM 8.4* 8.5* 8.5* 8.1* 8.2*   GFR: Estimated Creatinine Clearance: 82.2 mL/min (by C-G formula based on SCr of 0.64 mg/dL). Liver Function Tests: Recent Labs  Lab 02/13/19 0958  AST 41  ALT 43  ALKPHOS 108  BILITOT 0.8  PROT 6.5  ALBUMIN 2.2*   No results for input(s): LIPASE, AMYLASE in the last 168 hours. No results for input(s): AMMONIA  in the last 168 hours. Coagulation Profile: No results for input(s): INR, PROTIME in the last 168 hours. Cardiac Enzymes: Recent Labs  Lab 02/13/19 0958  TROPONINI <0.03   BNP (last 3 results) No results for input(s): PROBNP in the last 8760 hours. HbA1C: No results for input(s): HGBA1C in the last 72 hours. CBG: Recent Labs  Lab 02/17/19 0815 02/17/19 1158 02/17/19 1717 02/17/19 2138 02/18/19 0718  GLUCAP 130* 152* 124* 192* 159*   Lipid Profile: No results for input(s): CHOL, HDL, LDLCALC, TRIG, CHOLHDL, LDLDIRECT in the last 72 hours. Thyroid Function Tests: No results for input(s): TSH, T4TOTAL, FREET4, T3FREE, THYROIDAB in the last 72 hours. Anemia Panel: No results for input(s): VITAMINB12, FOLATE, FERRITIN, TIBC, IRON, RETICCTPCT in the last 72 hours. Urine analysis:    Component Value Date/Time   COLORURINE YELLOW 02/13/2019 1027   APPEARANCEUR HAZY (A) 02/13/2019 1027   LABSPEC 1.025 02/13/2019 1027   PHURINE 5.0 02/13/2019 1027   GLUCOSEU 250 (A) 02/13/2019 1027   HGBUR LARGE (A) 02/13/2019 1027   BILIRUBINUR SMALL (A) 02/13/2019 1027   KETONESUR 15 (A) 02/13/2019 1027   PROTEINUR 100 (A) 02/13/2019 1027   UROBILINOGEN 0.2 06/15/2015 2200   NITRITE NEGATIVE 02/13/2019 1027   LEUKOCYTESUR NEGATIVE 02/13/2019 1027   Sepsis Labs: @LABRCNTIP (procalcitonin:4,lacticidven:4) ) Recent Results (from the past 240 hour(s))  Blood Culture (routine x 2)     Status: None   Collection Time:  02/13/19 10:02 AM  Result Value Ref Range Status   Specimen Description BLOOD RIGHT ANTECUBITAL  Final   Special Requests   Final    BOTTLES DRAWN AEROBIC AND ANAEROBIC Blood Culture adequate volume   Culture   Final    NO GROWTH 5 DAYS Performed at [facility], [address] [place], [place] [address]    Report Status 02/18/2019 FINAL  Final  Blood Culture (routine x 2)     Status: None   Collection Time: 02/13/19 10:30 AM  Result Value Ref Range Status   Specimen Description BLOOD LEFT HAND  Final   Special Requests   Final    BOTTLES DRAWN AEROBIC ONLY Blood Culture results may not be optimal due to an inadequate volume of blood received in culture bottles   Culture   Final    NO GROWTH 5 DAYS Performed at [facility], [address], [place], [place] [address]    Report Status 02/18/2019 FINAL  Final  Urine culture     Status: None   Collection Time: 02/13/19 10:30 AM  Result Value Ref Range Status   Specimen Description URINE, CLEAN CATCH  Final   Special Requests NONE  Final   Culture   Final    NO GROWTH Performed at [facility], [address], [place], [place] [address]    Report Status 02/14/2019 FINAL  Final  Novel Coronavirus, NAA (hospital order; send-out to ref lab)     Status: None   Collection Time: 02/13/19  6:30 PM  Result Value Ref Range Status   SARS-CoV-2, NAA NOT DETECTED NOT DETECTED Final    Comment: Negative (Not Detected) results do not exclude infection caused by SARS CoV 2 and should not be used as the sole basis for treatment or other patient management decisions. Optimum specimen types and timing for peak viral levels during infections caused  by SARS CoV 2 have not been determined. Collection of multiple specimens (types and time points) from the same patient may be necessary to  detect the virus. Improper specimen collection and handling, sequence variability underlying assay primers and or probes, or the presence of organisms  in  quantities less than the limit of detection of the assay may lead to false negative results. Positive and negative predictive values of testing are highly dependent on prevalence. False negative results are more likely when prevalence of disease is high. (NOTE) The expected result is Negative (Not Detected). The SARS CoV 2 test is intended for the presumptive qualitative  detection of nucleic acid from SARS CoV 2 in upper and lower  respir atory specimens. Testing methodology is real time RT PCR. Test results must be correlated with clinical presentation and  evaluated in the context of other laboratory and epidemiologic data.  Test performance can be affected because the epidemiology and  clinical spectrum of infection caused by SARS CoV 2 is not fully  known. For example, the optimum types of specimens to collect and  when during the course of infection these specimens are most likely  to contain detectable viral RNA may not be known. This test has not been Food and Drug Administration (FDA) cleared or  approved and has been authorized by FDA under an Emergency Use  Authorization (EUA). The test is only authorized for the duration of  the declaration that circumstances exist justifying the authorization  of emergency use of in vitro diagnostic tests for detection and or  diagnosis of SARS CoV 2 under Section 564(b)(1) of the Act, 21 U.S.C.  section [phone] 3(b)(1), unless the authorization is terminated or   revoked sooner. [facility] is certified under the  Clinical Laboratory Improvement Amendments of 1988 (CLIA), 42 U.S.C.  section [phone] [id number], to perform high complexity tests. Performed at [facility] CLIA [id number] [address], [address], [address], [place], [place] [id number] Laboratory Director: Turner DanielsJoseph H. Willman, MD    Coronavirus Source NASOPHARYNGEAL  Final    Comment: Performed at [facility], [address] [place], [place]  [address]  MRSA PCR Screening     Status: Abnormal   Collection Time: 02/13/19  8:55 PM  Result Value Ref Range Status   MRSA by PCR POSITIVE (A) NEGATIVE Final    Comment:        The GeneXpert MRSA Assay (FDA approved for NASAL specimens only), is one component of a comprehensive MRSA colonization surveillance program. It is not intended to diagnose MRSA infection nor to guide or monitor treatment for MRSA infections. RESULT CALLED TO, READ BACK BY AND VERIFIED WITHGavin Potters: C FLETCHER RN 02/13/19 2337 JDW Performed at [facility], [address] [place], [place] [address]   Respiratory Panel by PCR     Status: Abnormal   Collection Time: 02/14/19  8:08 AM  Result Value Ref Range Status   Adenovirus NOT DETECTED NOT DETECTED Final   Coronavirus 229E NOT DETECTED NOT DETECTED Final    Comment: (NOTE) The Coronavirus on the Respiratory Panel, DOES NOT test for the novel  Coronavirus (2019 nCoV)    Coronavirus HKU1 NOT DETECTED NOT DETECTED Final   Coronavirus NL63 NOT DETECTED NOT DETECTED Final   Coronavirus OC43 NOT DETECTED NOT DETECTED Final   Metapneumovirus NOT DETECTED NOT DETECTED Final   Rhinovirus / Enterovirus DETECTED (A) NOT DETECTED Final   Influenza A NOT DETECTED NOT DETECTED Final   Influenza B NOT DETECTED NOT DETECTED Final   Parainfluenza Virus 1 NOT DETECTED NOT DETECTED Final   Parainfluenza Virus 2 NOT DETECTED  NOT DETECTED Final   Parainfluenza Virus 3 NOT DETECTED NOT DETECTED Final   Parainfluenza Virus 4 NOT DETECTED NOT DETECTED Final   Respiratory Syncytial Virus NOT DETECTED NOT DETECTED Final   Bordetella pertussis NOT DETECTED NOT DETECTED Final   Chlamydophila pneumoniae NOT DETECTED NOT DETECTED Final   Mycoplasma pneumoniae NOT DETECTED NOT DETECTED Final    Comment: Performed at [facility], [address] [place], [place] [address]         Radiology Studies: [place] Ekg Site Rite  Result Date: 02/17/2019 If [facility] image not  attached, placement could not be confirmed due to current cardiac rhythm.     Scheduled Meds: . amLODipine  2.5 mg Oral Daily  . aspirin EC  81 mg Oral Daily  . Chlorhexidine Gluconate Cloth  6 each Topical Q0600  . collagenase   Topical Daily  . enoxaparin (LOVENOX) injection  40 mg Subcutaneous Q24H  . feeding supplement (ENSURE ENLIVE)  237 mL Oral TID BM  . insulin aspart  0-5 Units Subcutaneous QHS  . insulin aspart  0-9 Units Subcutaneous TID WC  . insulin glargine  10 Units Subcutaneous BID  . lisinopril  20 mg Oral Daily  . metoprolol tartrate  2.5 mg Intravenous Q6H  . multivitamin with minerals  1 tablet Oral Daily  . mupirocin ointment  1 application Nasal BID  . pantoprazole (PROTONIX) IV  40 mg Intravenous Q24H  . phenytoin (DILANTIN) IV  100 mg Intravenous BID  . sodium chloride flush  10-40 mL Intracatheter Q12H   Continuous Infusions: . sodium chloride 75 mL/hr at 02/17/19 2256  . ceFEPime (MAXIPIME) IV 2 g (02/18/19 0016)  . levETIRAcetam 500 mg (02/17/19 2243)  . vancomycin 750 mg (02/17/19 2258)     LOS: 5 days      Calvert Cantor, MD [facility] Pager: [email or web address] Password [facility] 02/18/2019, 8:27 AM

## 2019-02-18 NOTE — Progress Notes (Signed)
  Speech Language Pathology Treatment: Dysphagia  Patient Details Name: Arthur Berry MRN: [id number] DOB: January 18, 1946 Today's Date: 02/18/2019 Time: 1201-1221 SLP Time Calculation (min) (ACUTE ONLY): 20 min  Assessment / Plan / Recommendation Clinical Impression  Pt sitting upright in bed upon SlP entrance to room.   He benefits from verbal cues to keep his eyes open duirng session.  RN reports po intake is poor but pt is tolerating.  SLP conducted clinical observations to assess tolerance and readiness for dietary advancement.  Pt assisted to self feed with his left hand and moderate tactile cues.  Immediate moderately significant cough post-swallow of Ensure via cup noted - ? Due to premature spillage into pharynx/larynx.  NO indication of aspiration with Ensure via teaspoon.   Pt consumed entire cup of Magic Cup [employer] with report *with direct question* of sensation of some residuals in pharynx.  He does have h/o goiter and GERD.  He was also observed to conduct delayed double swallow intermittently - which may help decrease oral and/or pharyngeal residuals.   As pt continues with decreased attention and concern for possible pharyngeal resdiuals - do not recommend to advance solids at this time.  Will modify liquids to nectar via tsp to help with potential pharyngeal residuals.  Will follow up for dysphagia management and advancement or instrumental swallow evaluation as indicated.     HPI HPI: Arthur Berry is a 73 y.o. male with medical history significant of type 2 diabetes on insulin and metformin, dementia, long-term nursing home resident, hypertension, history of seizure who is brought to the emergency room with altered mental status. Found to have Sepsis, reportedly pt had negative COVID-19 test within a week, but no documentation. Current test pending. Reportedly concern from RN and family regarding pts swallowing, But SIL reports pt was able to eat and drink well, but needed asssit  with self feeding at facility, was on finger foods. Rn reprots difficulty feeding pt due to oral holding and anterior spillage. Prior SLP notes from 10/2018 reports recent fall with mild head injury, recommended to consume regular/thin diet, swallow appeared [facility].       SLP Plan  Continue with current plan of care       Recommendations  Diet recommendations: Dysphagia 1 (puree);Nectar-thick liquid Medication Administration: Crushed with puree Compensations: Slow rate;Small sips/bites(allow time for intermittent dry swallow) Postural Changes and/or Swallow Maneuvers: Seated upright 90 degrees;Upright 30-60 min after meal                Oral Care Recommendations: Oral care BID Follow up Recommendations: Skilled Nursing facility SLP Visit Diagnosis: Dysphagia, oropharyngeal phase (R13.12) Plan: Continue with current plan of care       GO               Donavan Burnet, MS [facility] SLP Acute Rehab Services Pager [phone] Office [phone]  Chales Abrahams 02/18/2019, 2:34 PM

## 2019-02-18 NOTE — Evaluation (Signed)
Physical Therapy Evaluation Patient Details Name: Arthur BurySamuel W Kloehn MRN: [id number] DOB: 07-May-1946 Today's Date: 02/18/2019   History of Present Illness  Arthur Berry is a 73 y.o. male with medical history significant of type 2 diabetes on insulin and metformin, dementia, long-term nursing home resident, hypertension, history of seizure who is brought to the emergency room with altered mental status. Found to have Sepsis, reportedly pt had negative COVID-19 test within a week, but no documentation.  Clinical Impression  Patient presents with decreased mobility due to decreased strength, decreased balance, decreased cognition, decreased activity tolerance and rapid decline in past 3 months per notes from last admission.  Was apparently mobilizing in w/c, but now rigid and not attempting to assist with mobility with confusion and disorientation to self.  Feel  He may benefit from skilled PT in the acute setting and with follow up SNF level rehab.  Will continue to follow.     Follow Up Recommendations SNF    Equipment Recommendations  None recommended by PT    Recommendations for Other Services       Precautions / Restrictions Precautions Precautions: Fall      Mobility  Bed Mobility Overal bed mobility: Needs Assistance Bed Mobility: Rolling;Sidelying to Sit;Sit to Supine Rolling: Max assist;+2 for physical assistance Sidelying to sit: Total assist;+2 for physical assistance   Sit to supine: Total assist;+2 for physical assistance   General bed mobility comments: pt not attempting to help with mobility, rigid in trunk, neck  Transfers Overall transfer level: Needs assistance   Transfers: Sit to/from Stand;Lateral/Scoot Transfers Sit to Stand: Total assist;+2 physical assistance;From elevated surface        Lateral/Scoot Transfers: Total assist;+2 physical assistance General transfer comment: attempted sit to stand, but pt not assisting, [facility] with pt  again not attempting to assist  Ambulation/Gait                Stairs            Wheelchair Mobility    Modified Rankin (Stroke Patients Only)       Balance Overall balance assessment: Needs assistance Sitting-balance support: Feet supported Sitting balance-Leahy Scale: Poor Sitting balance - Comments: placed in upright, can balance for about 30 sec prior to LOB to R Postural control: Right lateral lean   Standing balance-Leahy Scale: Zero Standing balance comment: unable to stand wtih total A                             Pertinent Vitals/Pain Pain Assessment: Faces Faces Pain Scale: Hurts little more Pain Location: R arm Pain Descriptors / Indicators: Grimacing;Guarding Pain Intervention(s): Monitored during session;Repositioned    Home Living                        Prior Function Level of Independence: Needs assistance   Gait / Transfers Assistance Needed: previous notes from 3 months ago state pt required A to transfer to w/c which he used for mobility, needed assist for all ADL's including feeding           Hand Dominance        Extremity/Trunk Assessment   Upper Extremity Assessment Upper Extremity Assessment: RUE deficits/detail;LUE deficits/detail RUE Deficits / Details: edema in arm, fingers extended, able to move PROM with increased extensor tone noted LUE Deficits / Details: [facility]OM grossly [facility]    Lower Extremity Assessment Lower Extremity  Assessment: RLE deficits/detail;LLE deficits/detail RLE Deficits / Details: hip flexion contractures bilateral, able to assist some to move R LE toward EOB LLE Deficits / Details: L hip wound covered with foam dressing, hip flexion contracture, and unable to extend knee       Communication   Communication: No difficulties  Cognition Arousal/Alertness: Awake/alert Behavior During Therapy: Flat affect Overall Cognitive Status: No family/caregiver present to determine baseline  cognitive functioning                                 General Comments: disoriented to self      General Comments      Exercises     Assessment/Plan    PT Assessment Patient needs continued PT services  PT Problem List Decreased strength;Decreased mobility;Decreased activity tolerance;Decreased balance;Decreased range of motion;Decreased safety awareness;Decreased knowledge of use of DME       PT Treatment Interventions DME instruction;Therapeutic activities;Cognitive remediation;Therapeutic exercise;Balance training;Wheelchair mobility training;Functional mobility training    PT Goals (Current goals can be found in the Care Plan section)  Acute Rehab PT Goals Patient Stated Goal: family agreeable to SNF [name] per chart PT Goal Formulation: With family Time For Goal Achievement: 03/04/19 Potential to Achieve Goals: Fair    Frequency Min 2X/week   Barriers to discharge        Co-evaluation               AM-PAC PT "6 Clicks" Mobility  Outcome Measure Help needed turning from your back to your side while in a flat bed without using bedrails?: Total Help needed moving from lying on your back to sitting on the side of a flat bed without using bedrails?: Total Help needed moving to and from a bed to a chair (including a wheelchair)?: Total Help needed standing up from a chair using your arms (e.g., wheelchair or bedside chair)?: Total Help needed to walk in hospital room?: Total Help needed climbing 3-5 steps with a railing? : Total 6 Click Score: 6    End of Session Equipment Utilized During Treatment: Gait belt Activity Tolerance: Patient limited by lethargy Patient left: in bed;with call bell/phone within reach;with bed alarm set   PT Visit Diagnosis: Other symptoms and signs involving the nervous system (R29.898);Other abnormalities of gait and mobility (R26.89);Muscle weakness (generalized) (M62.81)    Time: 0165-5374 PT Time  Calculation (min) (ACUTE ONLY): 26 min   Charges:   PT Evaluation $PT Eval High Complexity: 1 High PT Treatments $Therapeutic Activity: 8-22 mins        Sheran Lawless, [facility] Services [phone] 02/18/2019   Elray Mcgregor 02/18/2019, 5:23 PM

## 2019-02-18 NOTE — Plan of Care (Signed)
  Problem: Pain Managment: Goal: General experience of comfort will improve Outcome: Progressing   

## 2019-02-18 NOTE — Consult Note (Signed)
   [facility] CM Inpatient Consult   02/18/2019  Arthur Berry Dec 04, 1945 [id number]  Patient screened for extreme  high risk score and 2 hospitalizations and 4 ED visits in the past 6 months in the Medicare/[employer] to check if potential [facility] Care Management needs. Chart review of TOC team reveals patient is from a memory care unit and likely for SNF.  No [facility] Care Management needs noted at this time.  Please place a [facility] Care Management consult or for questions contact:   Charlesetta Shanks, RN BSN CCM [facility]  [phone] business mobile phone Toll free office [phone]

## 2019-02-19 LAB — BASIC METABOLIC PANEL
Anion gap: 7 (ref 5–15)
BUN: 10 mg/dL (ref 8–23)
CO2: 29 mmol/L (ref 22–32)
Calcium: 8.3 mg/dL — ABNORMAL LOW (ref 8.9–10.3)
Chloride: 101 mmol/L (ref 98–111)
Creatinine, Ser: 0.77 mg/dL (ref 0.61–1.24)
GFR calc Af Amer: >60 mL/min (ref >60)
GFR calc non Af Amer: >60 mL/min (ref >60)
Glucose, Bld: 283 mg/dL — ABNORMAL HIGH (ref 70–99)
Potassium: 3.4 mmol/L — ABNORMAL LOW (ref 3.5–5.1)
Sodium: 137 mmol/L (ref 135–145)

## 2019-02-19 LAB — GLUCOSE, CAPILLARY
Glucose-Capillary: 238 mg/dL — ABNORMAL HIGH (ref 70–99)
Glucose-Capillary: 278 mg/dL — ABNORMAL HIGH (ref 70–99)
Glucose-Capillary: 193 mg/dL — ABNORMAL HIGH (ref 70–99)
Glucose-Capillary: 276 mg/dL — ABNORMAL HIGH (ref 70–99)

## 2019-02-19 LAB — MAGNESIUM: Magnesium: 1.9 mg/dL (ref 1.7–2.4)

## 2019-02-19 MED ORDER — POTASSIUM CHLORIDE CRYS ER 20 MEQ PO TBCR
40.0000 meq | EXTENDED_RELEASE_TABLET | Freq: Once | ORAL | Status: DC
  Filled 2019-02-19: qty 2

## 2019-02-19 MED ORDER — POTASSIUM CHLORIDE 20 MEQ/15ML (10%) PO SOLN
40.0000 meq | Freq: Once | ORAL | Status: AC
  Administered 2019-02-19: 40 meq via ORAL
  Filled 2019-02-19: qty 30

## 2019-02-19 MED ORDER — LEVETIRACETAM 500 MG PO TABS
500.0000 mg | ORAL_TABLET | Freq: Two times a day (BID) | ORAL | Status: DC
  Filled 2019-02-19: qty 1

## 2019-02-19 MED ORDER — LEVETIRACETAM 500 MG PO TABS
500.0000 mg | ORAL_TABLET | Freq: Two times a day (BID) | ORAL | Status: DC
  Administered 2019-02-19 (×2): 500 mg via ORAL
  Filled 2019-02-19: qty 1

## 2019-02-19 MED ORDER — ENSURE ENLIVE PO LIQD
237.0000 mL | Freq: Two times a day (BID) | ORAL | Status: DC
  Administered 2019-02-21: 237 mL via ORAL

## 2019-02-19 MED ORDER — INSULIN GLARGINE 100 UNIT/ML ~~LOC~~ SOLN
13.0000 [IU] | Freq: Two times a day (BID) | SUBCUTANEOUS | Status: DC
  Administered 2019-02-19 – 2019-02-20 (×3): 13 [IU] via SUBCUTANEOUS
  Filled 2019-02-19 (×5): qty 0.13

## 2019-02-19 MED ORDER — PANTOPRAZOLE SODIUM 40 MG PO TBEC: 40.0000 mg | DELAYED_RELEASE_TABLET | Freq: Every day | ORAL | Status: DC

## 2019-02-19 MED ORDER — PHENYTOIN SODIUM EXTENDED 100 MG PO CAPS
300.0000 mg | ORAL_CAPSULE | Freq: Every day | ORAL | Status: DC
  Administered 2019-02-19: 300 mg via ORAL
  Filled 2019-02-19: qty 3

## 2019-02-19 MED ORDER — INSULIN ASPART 100 UNIT/ML ~~LOC~~ SOLN
0.0000 [IU] | Freq: Three times a day (TID) | SUBCUTANEOUS | Status: DC
  Administered 2019-02-19: 3 [IU] via SUBCUTANEOUS

## 2019-02-19 MED ORDER — INSULIN ASPART 100 UNIT/ML ~~LOC~~ SOLN
0.0000 [IU] | Freq: Every day | SUBCUTANEOUS | Status: DC
  Administered 2019-02-19: 3 [IU] via SUBCUTANEOUS

## 2019-02-19 NOTE — Progress Notes (Signed)
Pt is sleepy throughout the day. Fed by staff at meal times. Pt is having a hard time swallowing the Dys II consistency, takes a long time chewing.

## 2019-02-19 NOTE — Progress Notes (Signed)
Nutrition Follow-up  RD working remotely.  DOCUMENTATION CODES:   Not applicable  INTERVENTION:   -Decrease Ensure Enlive po to BID, each supplement provides 350 kcal and 20 grams of protein -Continue Magic Cup BID with meals, each supplement provides 290 kcals and 9 grams protein -Continue MVI with minerals daily -Continue feeding assistance with meals -Recommend bowel regimen, as pt has not had a BM since admission  NUTRITION DIAGNOSIS:   Increased nutrient needs related to wound healing as evidenced by estimated needs.  Ongoing  GOAL:   Patient will meet greater than or equal to 90% of their needs  Progressing  MONITOR:   Diet advancement, PO intake, Supplement acceptance, Labs, Weight trends, Skin, I & O's  REASON FOR ASSESSMENT:   Low Braden    ASSESSMENT:   Arthur Berry a 73 y.o.malewith medical history significant oftype 2 diabetes on insulin and metformin, dementia, long-term nursing home resident, hypertension, history of seizure who is brought to the emergency room with altered mental status. Patient is currently altered and unable to provide any history. Discussed with patient's brother who had seen him 3 weeks ago before stay home order and had no complaints. At baseline, patient is total dependent and is risk of fall on attempted ambulation. He has some behavior disturbances due to cognitive dysfunction as per his brother.  4/6- s/p BSE- advanced to dysphagia 1 diet with honey thick liquids, COVID test negative, PICC placed 4/7- s/p repeat BSE- advanced to dysphagia 1 diet with nectar thick liquids  Reviewed I/O's: -1.9 L x 24 hours and +3.7 L since admission  UOP: 2.7 L x 24 hours  Pt's intake has improved over the past 48 hours; noted documented intake 25-100% of meals. He has been taking Ensure supplements and Magic Cup per SLP notes. He has been advanced to nectar thick liquids, but SLP recommending not advancing to solid foods at this time  secondary to concern with for decreased attention and concern for possible pharyngeal residuals.   Pt remains with variable oral intake and would benefit from nutrient dense supplement. One Ensure Enlive supplement provides 350 kcals, 20 grams protein, and 44-45 grams of carbohydrate vs one Glucerna shake supplement, which provides 220 kcals, 10 grams of protein, and 26 grams of carbohydrate. Given pt's hx of DM, RD will continue to monitor PO intake, CBGS, and adjust supplement regimen as appropriate.   Pt with documented BM since admission; recommend considering bowel regimen.   Per MD notes, plan to d/c to SNF after PT/OT evaluations.   Labs reviewed: K: 3.4, CBGS: 248-326 (inpatient orders for glycemic control are 0-5 units insulin aspart q HS, 0-9 units insulin aspart TID with meals, and 13 unitsinsulin glargine BID).   Diet Order:   Diet Order            DIET - DYS 1 Room service appropriate? Yes; Fluid consistency: Nectar Thick  Diet effective now              EDUCATION NEEDS:   Not appropriate for education at this time  Skin:  Skin Assessment: Skin Integrity Issues: Skin Integrity Issues:: Unstageable Unstageable: lt hip  Last BM:  Unknown  Height:   Ht Readings from Last 1 Encounters:  02/13/19 5\' 9"  (1.753 m)    Weight:   Wt Readings from Last 1 Encounters:  02/18/19 82.6 kg    Ideal Body Weight:  72.7 kg  BMI:  Body mass index is 26.89 kg/m.  Estimated Nutritional Needs:  Kcal:  2100-2300  Protein:  110-125 grams  Fluid:  > 2.1 L    Marilene Vath A. Mayford KnifeWilliams, RD, LDN, CDCES Registered Dietitian II Certified Diabetes Care and Education Specialist Pager: [phone] After hours Pager: [phone]

## 2019-02-19 NOTE — Progress Notes (Signed)
Consulted with patient's son [name] who reports [facility] as preference for SNF. They declined due to patient's wandering behaviors when patient was previously at [facility].   [name] agreed for referral to be faxed to [facility] for memory care SNF. Referral sent, they are reviewing chart.   [place], [place] [phone]

## 2019-02-19 NOTE — Progress Notes (Addendum)
Physical Therapy Treatment Patient Details Name: Arthur Berry MRN: [id number] DOB: 1946-08-22 Today's Date: 02/19/2019    History of Present Illness Arthur Berry is a 73 y.o. male with medical history significant of type 2 diabetes on insulin and metformin, dementia, long-term nursing home resident, hypertension, history of seizure who is brought to the emergency room with altered mental status. Found to have Sepsis, negative COVID-19 test as of 02/17/19, positive for rhinovirus.    PT Comments    Patient seen for mobility progression. This session focused on bed mobility and sitting balance EOB. Pt requires total A +2 for all bed mobility. Max A required for sitting EOB with brief periods of time with min guard however pt unable to tolerated challenges to sitting balance.  Continue to progress as tolerated with anticipated d/c to SNF for further skilled PT services.    Follow Up Recommendations  SNF     Equipment Recommendations  None recommended by PT    Recommendations for Other Services       Precautions / Restrictions Precautions Precautions: Fall Restrictions Weight Bearing Restrictions: No    Mobility  Bed Mobility Overal bed mobility: Needs Assistance Bed Mobility: Rolling;Supine to Sit;Sit to Supine Rolling: +2 for physical assistance;Total assist   Supine to sit: Total assist;+2 for physical assistance Sit to supine: Total assist;+2 for physical assistance   General bed mobility comments: cues for sequencing; total A +2 for all aspects of bed mobility  Transfers                 General transfer comment: deferred for patient/therapist safety; will need hoyer lift for OOB  Ambulation/Gait                 Stairs             Wheelchair Mobility    Modified Rankin (Stroke Patients Only)       Balance Overall balance assessment: Needs assistance Sitting-balance support: Feet supported;Bilateral upper extremity supported Sitting  balance-Leahy Scale: Poor Sitting balance - Comments: grossly max A to maintain sitting balance; at times min guard for safety however unable to tolerate challenges Postural control: Right lateral lean                                  Cognition Arousal/Alertness: Awake/alert Behavior During Therapy: Flat affect Overall Cognitive Status: History of cognitive impairments - at baseline                                        Exercises      General Comments General comments (skin integrity, edema, etc.): bilat UE edema      Pertinent Vitals/Pain Pain Assessment: Faces Faces Pain Scale: Hurts little more Pain Location: unspecified; pt says "Ow, ow, ow!" with bed mobility Pain Descriptors / Indicators: Grimacing;Guarding Pain Intervention(s): Limited activity within patient's tolerance;Monitored during session;Repositioned    Home Living                      Prior Function            PT Goals (current goals can now be found in the care plan section) Acute Rehab PT Goals Patient Stated Goal: family agreeable to SNF [name] per chart Progress towards PT goals: Progressing toward goals  Frequency    Min 2X/week      PT Plan Current plan remains appropriate    Co-evaluation PT/OT/SLP Co-Evaluation/Treatment: Yes Reason for Co-Treatment: For patient/therapist safety;To address functional/ADL transfers;Necessary to address cognition/behavior during functional activity PT goals addressed during session: Mobility/safety with mobility;Balance        AM-PAC PT "6 Clicks" Mobility   Outcome Measure  Help needed turning from your back to your side while in a flat bed without using bedrails?: Total Help needed moving from lying on your back to sitting on the side of a flat bed without using bedrails?: Total Help needed moving to and from a bed to a chair (including a wheelchair)?: Total Help needed standing up from a  chair using your arms (e.g., wheelchair or bedside chair)?: Total Help needed to walk in hospital room?: Total Help needed climbing 3-5 steps with a railing? : Total 6 Click Score: 6    End of Session   Activity Tolerance: Patient tolerated treatment well Patient left: in bed;with call bell/phone within reach;with nursing/sitter in room Nurse Communication: Mobility status PT Visit Diagnosis: Other symptoms and signs involving the nervous system (R29.898);Other abnormalities of gait and mobility (R26.89);Muscle weakness (generalized) (M62.81)     Time: [id number] PT Time Calculation (min) (ACUTE ONLY): 33 min  Charges:  $Therapeutic Activity: 8-22 mins                     Arthur Berry, PTA Acute Rehabilitation Services Pager: [phone]) [phone] Office: [phone]     Arthur EdouardKellyn R Benz Berry 02/19/2019, 8:58 AM

## 2019-02-19 NOTE — Plan of Care (Signed)
?  Problem: Elimination: ?Goal: Will not experience complications related to bowel motility ?Outcome: Progressing ?  ?Problem: Pain Managment: ?Goal: General experience of comfort will improve ?Outcome: Progressing ?  ?Problem: Safety: ?Goal: Ability to remain free from injury will improve ?Outcome: Progressing ?  ?

## 2019-02-19 NOTE — Progress Notes (Signed)
  Speech Language Pathology Treatment: Dysphagia  Patient Details Name: Arthur Berry MRN: [id number] DOB: 04/30/1946 Today's Date: 02/19/2019 Time: 5638-9373 SLP Time Calculation (min) (ACUTE ONLY): 19 min  Assessment / Plan / Recommendation Clinical Impression  Pt demonstrates improvement since initial session, less cueing needed, pt able to shape lips appropriately to spoon, cup and straw with only minimal tactile and/or verbal cues. No anterior spillage. Attends to PO automatically. Pt tolerated nectar teaspoon, cup and straw with consecutive intake without signs of aspiration. Trialed thin liquids with a straw and appearance of slight timing impairment resulted in cough response. Pt also able to appropriately masticate crackers without evidence of residue. Will upgrade diet and method of feeding to dys 2/nectar thick via cup or straw to reduce time for feeing and increase hydration and intake. Will follow for tolerance.   HPI        SLP Plan  Continue with current plan of care       Recommendations  Diet recommendations: Nectar-thick liquid;Dysphagia 2 (fine chop) Liquids provided via: Straw;Cup Medication Administration: Whole meds with puree Supervision: Full supervision/cueing for compensatory strategies Compensations: Slow rate;Small sips/bites;Multiple dry swallows after each bite/sip Postural Changes and/or Swallow Maneuvers: Seated upright 90 degrees                Oral Care Recommendations: Oral care BID Follow up Recommendations: Skilled Nursing facility SLP Visit Diagnosis: Dysphagia, oropharyngeal phase (R13.12) Plan: Continue with current plan of care       GO              Harlon Ditty, MA CCC-SLP  Acute Rehabilitation Services Pager [phone] Office [phone]   Arthur Berry 02/19/2019, 10:18 AM

## 2019-02-19 NOTE — Progress Notes (Addendum)
PROGRESS NOTE    Arthur Berry  UJW:[id number] DOB: [birth year] DOA: 02/13/2019 PCP: Zeb Comfort   Brief Narrative:  Arthur Berry is Arthur Berry 73 y.o.malewith medical history significant oftype 2 diabetes on insulin and metformin, dementia with behavioral issues, HTN, seizure disorder, long-term nursing home resident, hypertension, history of seizure who is brought to the emergency room with altered mental status. At baseline, patient is total care and is risk of fall on attempted ambulation. He has some behavior disturbances due to cognitive dysfunction as per his brother.  in ED : temp 100.2, WBC 16.3, RR was up to 23, BUN 40, Cr 1.35, Glucose 323, Sodium 149, CXR showed clear lungs.   4/3- Metapneumovirus +   Assessment & Plan:   Principal Problem:   Sepsis (HCC) Active Problems:   Diabetes mellitus without complication (HCC)   HLD (hyperlipidemia)   Essential hypertension, benign   GERD (gastroesophageal reflux disease)   CVA (cerebral vascular accident) (HCC)   Epilepsy (HCC)   Acute metabolic encephalopathy   Dehydration with hypernatremia   Altered mental status   Pressure injury of skin    Sepsis due to meta pneumovirus - blood cultures, UA and Urine culture neg - no infiltrates on CXR - not hypoxic - leukocytosis improving - S/p 5 days vanc/cefepime    Dehydration with hypernatremia - has resolved with IV hydration  Acute metabolic encephalopathy with underlying dementia - due to infection and electrolyte abnormalities - unclear what his baseline is, will discuss with family - for me today, he did respond appropriately at times.  Followed commands inconsistently.  Eyes were closed during most of our encounter.  (sister said at last time she saw him, March - didn't know date, or what he'd eaten, but did know siblings - she also noticed that he couldn't see well at that time either) - Lexapro, Galantamine, Remeron, Risperdal and Ativan are on hold   Hypokalemia - replace and follow    Diabetes mellitus without complication  - on Lantus and Novolog (increase lantus to 13 BID, moderate SSI)    Essential hypertension, benign - resume Lisinopril Amlodipine and Coreg today    GERD (gastroesophageal reflux disease) - on Protonix daily    CVA (cerebral vascular accident)  - on Aspirin as outpt    Epilepsy (HCC) -cont Keppra and Depakote  Poor oral intake - appreciate RD input   Dysphagia - dysphagia 2, nectar thick liquid, see speech     Pressure injury of skin  - left hip necrotic ulcer left hip- follow wound care recommendations - will discuss with family chronicity and outpatient care  DVT prophylaxis: lovenox Code Status: DNR Family Communication: discussed with sister 4/8 Nita Sickle - states she is his HCPOA).  She was interested in palliative care discussion inpatient and potentially post discharge as well.  Disposition Plan: pending SNF placement  Consultants:   none  Procedures:  none  Antimicrobials:  Anti-infectives (From admission, onward)   Start     Dose/Rate Route Frequency Ordered Stop   02/17/19 1000  vancomycin (VANCOCIN) IVPB 750 mg/150 ml premix  Status:  Discontinued     750 mg 150 mL/hr over 60 Minutes Intravenous Every 12 hours 02/16/19 2233 02/18/19 0836   02/14/19 1000  ceFEPIme (MAXIPIME) 2 g in sodium chloride 0.9 % 100 mL IVPB  Status:  Discontinued     2 g 200 mL/hr over 30 Minutes Intravenous Every 24 hours 02/13/19 1214 02/14/19 0848   02/14/19 1000  vancomycin (VANCOCIN)  1,250 mg in sodium chloride 0.9 % 250 mL IVPB  Status:  Discontinued     1,250 mg 166.7 mL/hr over 90 Minutes Intravenous Every 24 hours 02/13/19 1214 02/14/19 0854   02/14/19 1000  ceFEPIme (MAXIPIME) 2 g in sodium chloride 0.9 % 100 mL IVPB  Status:  Discontinued     2 g 200 mL/hr over 30 Minutes Intravenous Every 12 hours 02/14/19 0848 02/18/19 0836   02/14/19 1000  vancomycin (VANCOCIN) 1,000 mg in  sodium chloride 0.9 % 250 mL IVPB  Status:  Discontinued     1,000 mg 250 mL/hr over 60 Minutes Intravenous Every 12 hours 02/14/19 0854 02/16/19 2233   02/13/19 1000  vancomycin (VANCOCIN) IVPB 1000 mg/200 mL premix  Status:  Discontinued     1,000 mg 200 mL/hr over 60 Minutes Intravenous  Once 02/13/19 0956 02/13/19 0959   02/13/19 1000  ceFEPIme (MAXIPIME) 2 g in sodium chloride 0.9 % 100 mL IVPB     2 g 200 mL/hr over 30 Minutes Intravenous  Once 02/13/19 0956 02/13/19 1106   02/13/19 1000  vancomycin (VANCOCIN) 1,500 mg in sodium chloride 0.9 % 500 mL IVPB     1,500 mg 250 mL/hr over 120 Minutes Intravenous  Once 02/13/19 0959 02/15/19 0817     Subjective: Denies any complaints. Eyes were closed and very sleepy.   Objective: Vitals:   02/18/19 1746 02/18/19 1946 02/19/19 0359 02/19/19 1049  BP: (!) 149/79 (!) 167/87 (!) 162/79 130/74  Pulse: (!) 104 93 83 95  Resp:  Temp:  98.5 F (36.9 C) 98 F (36.7 C) 98.6 F (37 C)  TempSrc:  Oral Oral Oral  SpO2:  100% 100% 99%  Weight:      Height:        Intake/Output Summary (Last 24 hours) at 02/19/2019 1504 Last data filed at 02/19/2019 1100 Gross per 24 hour  Intake 840 ml  Output 1400 ml  Net -560 ml   Filed Weights   02/15/19 0300 02/16/19 0300 02/18/19 0542  Weight: 81.2 kg 83.3 kg 82.6 kg    Examination:  General exam: Appears calm and comfortable  Respiratory system: Clear to auscultation. Respiratory effort normal. Cardiovascular system: S1 & S2 heard, RRR. Gastrointestinal system: Abdomen is nondistended, soft and nontender.  Central nervous system: Sleepy Arthur Berry little difficult to arouse, responded appropriately to questions, PERRL Extremities: no LEE Skin: pressure ulcer not visualized today  Data Reviewed: I have personally reviewed following labs and imaging studies  CBC: Recent Labs  Lab 02/13/19 0958 02/14/19 0659 02/15/19 0339 02/16/19 0916 02/17/19 0650 02/18/19 0329  WBC 16.3* 13.6*  13.6* 13.3* 14.1* 12.5*  NEUTROABS 12.9*  --  9.9* 9.0* 9.5* 8.3*  HGB 12.2* 10.3* 10.2* 10.0* 10.0* 10.3*  HCT 37.5* 32.6* 31.4* 30.3* 29.1* 31.2*  MCV 96.6 95.6 96.6 94.1 91.8 91.2  PLT 371 307 330 312 318 370   Basic Metabolic Panel: Recent Labs  Lab 02/15/19 0339 02/16/19 0916 02/17/19 0650 02/18/19 0329 02/19/19 0403  NA 148* 142 136 138 137  K 3.7 3.2* 2.9* 3.0* 3.4*  CL 118* 111 103 103 101  CO2 GLUCOSE 281* 181* 131* 176* 283*  BUN 6* 10  CREATININE 0.90 0.78 0.67 0.64 0.77  CALCIUM 8.5* 8.5* 8.1* 8.2* 8.3*  MG  --   --   --  1.4* 1.9   GFR: Estimated Creatinine Clearance: 82.2 mL/min (by C-G formula based  on SCr of 0.77 mg/dL). Liver Function Tests: Recent Labs  Lab 02/13/19 0958  AST 41  ALT 43  ALKPHOS 108  BILITOT 0.8  PROT 6.5  ALBUMIN 2.2*   No results for input(s): LIPASE, AMYLASE in the last 168 hours. No results for input(s): AMMONIA in the last 168 hours. Coagulation Profile: No results for input(s): INR, PROTIME in the last 168 hours. Cardiac Enzymes: Recent Labs  Lab 02/13/19 0958  TROPONINI <0.03   BNP (last 3 results) No results for input(s): PROBNP in the last 8760 hours. HbA1C: No results for input(s): HGBA1C in the last 72 hours. CBG: Recent Labs  Lab 02/18/19 1144 02/18/19 1616 02/18/19 2235 02/19/19 0747 02/19/19 1245  GLUCAP 248* 298* 326* 238* 278*   Lipid Profile: No results for input(s): CHOL, HDL, LDLCALC, TRIG, CHOLHDL, LDLDIRECT in the last 72 hours. Thyroid Function Tests: No results for input(s): TSH, T4TOTAL, FREET4, T3FREE, THYROIDAB in the last 72 hours. Anemia Panel: No results for input(s): VITAMINB12, FOLATE, FERRITIN, TIBC, IRON, RETICCTPCT in the last 72 hours. Sepsis Labs: Recent Labs  Lab 02/13/19 0958 02/13/19 1445  LATICACIDVEN 1.7 1.2    Recent Results (from the past 240 hour(s))  Blood Culture (routine x 2)     Status: None   Collection Time: 02/13/19 10:02 AM   Result Value Ref Range Status   Specimen Description BLOOD RIGHT ANTECUBITAL  Final   Special Requests   Final    BOTTLES DRAWN AEROBIC AND ANAEROBIC Blood Culture adequate volume   Culture   Final    NO GROWTH 5 DAYS Performed at [facility], [address] [place], [place] [address]    Report Status 02/18/2019 FINAL  Final  Blood Culture (routine x 2)     Status: None   Collection Time: 02/13/19 10:30 AM  Result Value Ref Range Status   Specimen Description BLOOD LEFT HAND  Final   Special Requests   Final    BOTTLES DRAWN AEROBIC ONLY Blood Culture results may not be optimal due to an inadequate volume of blood received in culture bottles   Culture   Final    NO GROWTH 5 DAYS Performed at [facility], [address] [place], [place] [address]    Report Status 02/18/2019 FINAL  Final  Urine culture     Status: None   Collection Time: 02/13/19 10:30 AM  Result Value Ref Range Status   Specimen Description URINE, CLEAN CATCH  Final   Special Requests NONE  Final   Culture   Final    NO GROWTH Performed at [facility], [address] [place], [place] [address]    Report Status 02/14/2019 FINAL  Final  Novel Coronavirus, NAA (hospital order; send-out to ref lab)     Status: None   Collection Time: 02/13/19  6:30 PM  Result Value Ref Range Status   SARS-CoV-2, NAA NOT DETECTED NOT DETECTED Final    Comment: Negative (Not Detected) results do not exclude infection caused by SARS CoV 2 and should not be used as the sole basis for treatment or other patient management decisions. Optimum specimen types and timing for peak viral levels during infections caused  by SARS CoV 2 have not been determined. Collection of multiple specimens (types and time points) from the same patient may be necessary to detect the virus. Improper specimen collection and handling, sequence variability underlying assay primers and or probes, or the presence of organisms in  quantities  less than the limit of detection of the assay may lead to false negative results. Positive and negative predictive values of testing are highly dependent on prevalence. False negative results are more likely when prevalence of disease is high. (NOTE) The expected result is Negative (Not Detected). The SARS CoV 2 test is intended for the presumptive qualitative  detection of nucleic acid from SARS CoV 2 in upper and lower  respir atory specimens. Testing methodology is real time RT PCR. Test results must be correlated with clinical presentation and  evaluated in the context of other laboratory and epidemiologic data.  Test performance can be affected because the epidemiology and  clinical spectrum of infection caused by SARS CoV 2 is not fully  known. For example, the optimum types of specimens to collect and  when during the course of infection these specimens are most likely  to contain detectable viral RNA may not be known. This test has not been Food and Drug Administration (FDA) cleared or  approved and has been authorized by FDA under an Emergency Use  Authorization (EUA). The test is only authorized for the duration of  the declaration that circumstances exist justifying the authorization  of emergency use of in vitro diagnostic tests for detection and or  diagnosis of SARS CoV 2 under Section 564(b)(1) of the Act, 21 U.S.C.  section [phone] 3(b)(1), unless the authorization is terminated or   revoked sooner. [facility] is certified under the  Clinical Laboratory Improvement Amendments of 1988 (CLIA), 42 U.S.C.  section [phone], to perform high complexity tests. Performed at [facility] CLIA [id number] [address], [place], [place] [address] Laboratory Director: Turner Daniels, MD    Coronavirus Source NASOPHARYNGEAL  Final    Comment: Performed at [facility], [address] [place], [place] [address]  MRSA PCR  Screening     Status: Abnormal   Collection Time: 02/13/19  8:55 PM  Result Value Ref Range Status   MRSA by PCR POSITIVE (Rexann Lueras) NEGATIVE Final    Comment:        The GeneXpert MRSA Assay (FDA approved for NASAL specimens only), is one component of Masey Scheiber comprehensive MRSA colonization surveillance program. It is not intended to diagnose MRSA infection nor to guide or monitor treatment for MRSA infections. RESULT CALLED TO, READ BACK BY AND VERIFIED WITHGavin Potters RN 02/13/19 2337 JDW Performed at [facility], [address], [place], [place] [address]   Respiratory Panel by PCR     Status: Abnormal   Collection Time: 02/14/19  8:08 AM  Result Value Ref Range Status   Adenovirus NOT DETECTED NOT DETECTED Final   Coronavirus 229E NOT DETECTED NOT DETECTED Final    Comment: (NOTE) The Coronavirus on the Respiratory Panel, DOES NOT test for the novel  Coronavirus (2019 nCoV)    Coronavirus HKU1 NOT DETECTED NOT DETECTED Final   Coronavirus NL63 NOT DETECTED NOT DETECTED Final   Coronavirus OC43 NOT DETECTED NOT DETECTED Final   Metapneumovirus NOT DETECTED NOT DETECTED Final   Rhinovirus / Enterovirus DETECTED (Chardai Gangemi) NOT DETECTED Final   Influenza Hannan Hutmacher NOT DETECTED NOT DETECTED Final   Influenza B NOT DETECTED NOT DETECTED Final   Parainfluenza Virus 1 NOT DETECTED NOT DETECTED Final   Parainfluenza Virus 2 NOT DETECTED NOT DETECTED Final   Parainfluenza Virus 3 NOT DETECTED NOT DETECTED Final   Parainfluenza Virus 4 NOT DETECTED NOT DETECTED Final  Respiratory Syncytial Virus NOT DETECTED NOT DETECTED Final   Bordetella pertussis NOT DETECTED NOT DETECTED Final   Chlamydophila pneumoniae NOT DETECTED NOT DETECTED Final   Mycoplasma pneumoniae NOT DETECTED NOT DETECTED Final    Comment: Performed at [facility], [address], [place], [place] [address]         Radiology Studies: No results found.      Scheduled Meds: . amLODipine  2.5 mg Oral Daily  .  aspirin EC  81 mg Oral Daily  . carvedilol  3.125 mg Oral BID WC  . collagenase   Topical Daily  . enoxaparin (LOVENOX) injection  40 mg Subcutaneous Q24H  . feeding supplement (ENSURE ENLIVE)  237 mL Oral BID BM  . insulin aspart  0-5 Units Subcutaneous QHS  . insulin aspart  0-9 Units Subcutaneous TID WC  . insulin glargine  13 Units Subcutaneous BID  . levETIRAcetam  500 mg Oral BID  . lisinopril  20 mg Oral Daily  . multivitamin with minerals  1 tablet Oral Daily  . [START ON 02/20/2019] pantoprazole  40 mg Oral Daily  . phenytoin  300 mg Oral QHS  . sodium chloride flush  10-40 mL Intracatheter Q12H   Continuous Infusions: . sodium chloride 75 mL/hr at 02/18/19 1804     LOS: 6 days    Time spent: over 30 min    Lacretia Nicks, MD [facility] Pager AMION  If 7PM-7AM, please contact night-coverage [email or web address] Password [facility] 02/19/2019, 3:04 PM

## 2019-02-19 NOTE — [place] FL2 (Signed)
[place] MEDICAID FL2 LEVEL OF CARE SCREENING TOOL     IDENTIFICATION  Patient Name: Arthur Berry Birthdate: Apr 04, 1946 Sex: male Admission Date (Current Location): 02/13/2019  [facility] [place]   Facility and Address:  [facility], [address], [place], [place] [address]      Provider Number: [id number]  Attending Physician Name and Address:  Zigmund DanielPowell, A Caldwell Jr., *  Relative Name and Phone Number:  Lorella NimrodHarvey (brother) [phone]    Current Level of Care: Hospital Recommended Level of Care: Skilled Nursing Facility Prior Approval Number:    Date Approved/Denied: 10/24/17 PASRR Number: [id number]  Discharge Plan: SNF    Current Diagnoses: Patient Active Problem List   Diagnosis Date Noted  . Pressure injury of skin 02/14/2019  . Sepsis (HCC) 02/13/2019  . Dehydration with hypernatremia 02/13/2019  . Altered mental status 02/13/2019  . Acute metabolic encephalopathy 11/05/2018  . Hypernatremia 11/01/2018  . Status post total replacement of right hip 11/12/2017  . Closed right hip fracture, initial encounter (HCC) 10/23/2017  . Closed subcapital fracture of right femur (HCC)   . Adjustment reaction with aggression 05/14/2017  . Type 2 diabetes mellitus with other circulatory complications (HCC) 06/19/2015  . Thrombocytopenia (HCC) 06/19/2015  . Cerebral infarction due to unspecified mechanism   . Dilantin toxicity 06/18/2015  . Phenytoin toxicity   . Stroke (HCC) 06/16/2015  . Fall 06/16/2015  . Epilepsy (HCC) 06/16/2015  . BPH (benign prostatic hyperplasia) 06/16/2015  . Leukocytosis 06/16/2015  . CVA (cerebral infarction) 06/16/2015  . CVA (cerebral vascular accident) (HCC) 06/15/2015  . GERD (gastroesophageal reflux disease) 04/23/2014  . Potassium (K) deficiency 12/11/2012  . Precordial pain 12/11/2012  . Dehydration 04/15/2012  . AKI (acute kidney injury) (HCC) 04/14/2012  . HLD (hyperlipidemia)  06/01/2010  . Essential hypertension, benign 06/01/2010  . DYSPNEA 06/01/2010  . Diabetes mellitus without complication (HCC) 11/04/2009  . RECTAL BLEEDING 11/04/2009  . PERSONAL HX COLONIC POLYPS 11/04/2009    Orientation RESPIRATION BLADDER Height & Weight     (disoriented x4)  Normal External catheter, Incontinent(palced 02/13/19) Weight: 182 lb 1.6 oz (82.6 kg) Height:  5\' 9"  (175.3 cm)  BEHAVIORAL SYMPTOMS/MOOD NEUROLOGICAL BOWEL NUTRITION STATUS      Incontinent Diet(carb modified)  AMBULATORY STATUS COMMUNICATION OF NEEDS Skin   Limited Assist Verbally PU Stage and Appropriate Care(Unstageable ulcer on left hip)                       Personal Care Assistance Level of Assistance  Bathing, Feeding, Dressing Bathing Assistance: Limited assistance Feeding assistance: Independent Dressing Assistance: Limited assistance     Functional Limitations Info  Sight, Hearing, Speech Sight Info: Adequate Hearing Info: Adequate Speech Info: Adequate(disoriented x4)    SPECIAL CARE FACTORS FREQUENCY  PT (By licensed PT), OT (By licensed OT)     PT Frequency: min 5x weekly OT Frequency: min 5x weekly            Contractures Contractures Info: Not present    Additional Factors Info  Code Status, Allergies, Isolation Precautions Code Status Info: Full Code Allergies Info: No known allergies     Isolation Precautions Info: Droplet/contact     Current Medications (02/19/2019):  This is the current hospital active medication list Current Facility-Administered Medications  Medication Dose Route Frequency Provider Last Rate Last Dose  . 0.45 % sodium chloride infusion   Intravenous Continuous Marzetta BoardSpongberg, Christopher N, MD 75 mL/hr at 02/18/19  1804    . acetaminophen (TYLENOL) tablet 650 mg  650 mg Oral Q6H PRN Spongberg, Susy Frizzle, MD       Or  . acetaminophen (TYLENOL) suppository 650 mg  650 mg Rectal Q6H PRN Marzetta Board, MD      . amLODipine (NORVASC)  tablet 2.5 mg  2.5 mg Oral Daily Calvert Cantor, MD   2.5 mg at 02/18/19 0935  . aspirin EC tablet 81 mg  81 mg Oral Daily Marzetta Board, MD   81 mg at 02/18/19 0935  . carvedilol (COREG) tablet 3.125 mg  3.125 mg Oral BID WC Calvert Cantor, MD   3.125 mg at 02/18/19 1746  . collagenase (SANTYL) ointment   Topical Daily Spongberg, Susy Frizzle, MD      . enoxaparin (LOVENOX) injection 40 mg  40 mg Subcutaneous Q24H Marzetta Board, MD   40 mg at 02/18/19 2308  . feeding supplement (ENSURE ENLIVE) (ENSURE ENLIVE) liquid 237 mL  237 mL Oral TID BM Marzetta Board, MD   237 mL at 02/18/19 2022  . haloperidol lactate (HALDOL) injection 2 mg  2 mg Intravenous Q6H PRN Marzetta Board, MD   2 mg at 02/17/19 1204  . insulin aspart (novoLOG) injection 0-5 Units  0-5 Units Subcutaneous QHS Marzetta Board, MD   4 Units at 02/18/19 2309  . insulin aspart (novoLOG) injection 0-9 Units  0-9 Units Subcutaneous TID WC Marzetta Board, MD   5 Units at 02/18/19 1733  . insulin glargine (LANTUS) injection 10 Units  10 Units Subcutaneous BID Marzetta Board, MD   10 Units at 02/18/19 2308  . levETIRAcetam (KEPPRA) IVPB 500 mg/100 mL premix  500 mg Intravenous Q12H Marzetta Board, MD 400 mL/hr at 02/18/19 2306 500 mg at 02/18/19 2306  . lisinopril (PRINIVIL,ZESTRIL) tablet 20 mg  20 mg Oral Daily Calvert Cantor, MD   20 mg at 02/18/19 0934  . multivitamin with minerals tablet 1 tablet  1 tablet Oral Daily Marzetta Board, MD   1 tablet at 02/18/19 0934  . ondansetron (ZOFRAN) tablet 4 mg  4 mg Oral Q6H PRN Spongberg, Susy Frizzle, MD       Or  . ondansetron (ZOFRAN) injection 4 mg  4 mg Intravenous Q6H PRN Marzetta Board, MD      . pantoprazole (PROTONIX) injection 40 mg  40 mg Intravenous Q24H Marzetta Board, MD   40 mg at 02/18/19 0934  . phenytoin (DILANTIN) injection 100 mg  100 mg Intravenous BID Marzetta Board, MD   100 mg at 02/18/19 2310  . Resource ThickenUp Clear   Oral PRN Spongberg, Susy Frizzle, MD      . sodium chloride flush (NS) 0.9 % injection 10-40 mL  10-40 mL Intracatheter Q12H Spongberg, Susy Frizzle, MD   10 mL at 02/18/19 2308  . sodium chloride flush (NS) 0.9 % injection 10-40 mL  10-40 mL Intracatheter PRN Spongberg, Susy Frizzle, MD         Discharge Medications: Please see discharge summary for a list of discharge medications.  Relevant Imaging Results:  Relevant Lab Results:   Additional Information SSN: [id number]  Gildardo Griffes, LCSW

## 2019-02-19 NOTE — Evaluation (Signed)
Occupational Therapy Evaluation Patient Details Name: Arthur Berry MRN: [id number] DOB: 1946-02-24 Today's Date: 02/19/2019    History of Present Illness Arthur Berry is a 73 y.o. male with medical history significant of type 2 diabetes on insulin and metformin, dementia, long-term nursing home resident, hypertension, history of seizure who is brought to the emergency room with altered mental status. Found to have Sepsis, reportedly pt had negative COVID-19 test within a week, but no documentation.   Clinical Impression   PTA, pt was living as a long term resident at SNF and required assistance for ADLs. Pt currently requiring Total A for ADLs and bed mobility. Pt sitting at EOB and self feeding and Max hand over hand to bring spoon to mouth and Min-Max A for sitting support. Pt would benefit from further acute OT to facilitate safe dc. Recommend dc to SNF for further OT to optimize safety, independence with ADLs, and return to PLOF.      Follow Up Recommendations  SNF    Equipment Recommendations  None recommended by OT    Recommendations for Other Services       Precautions / Restrictions Precautions Precautions: Fall Restrictions Weight Bearing Restrictions: No      Mobility Bed Mobility Overal bed mobility: Needs Assistance Bed Mobility: Rolling;Supine to Sit;Sit to Supine Rolling: +2 for physical assistance;Total assist   Supine to sit: Total assist;+2 for physical assistance Sit to supine: Total assist;+2 for physical assistance   General bed mobility comments: cues for sequencing; total A +2 for all aspects of bed mobility  Transfers                 General transfer comment: deferred for patient/therapist safety; will need hoyer lift for OOB    Balance Overall balance assessment: Needs assistance Sitting-balance support: Feet supported;Bilateral upper extremity supported Sitting balance-Leahy Scale: Poor Sitting balance - Comments: grossly max A to  maintain sitting balance; at times min guard for safety however unable to tolerate challenges Postural control: Right lateral lean                                 ADL either performed or assessed with clinical judgement   ADL Overall ADL's : Needs assistance/impaired     Grooming: Total assistance;Sitting Grooming Details (indicate cue type and reason): Requiring Total A for self feeding at EOB                               General ADL Comments: Total A for ADLs and bed mobility     Vision         Perception     Praxis      Pertinent Vitals/Pain Pain Assessment: Faces Faces Pain Scale: Hurts little more Pain Location: unspecified; pt says "Ow, ow, ow!" with bed mobility Pain Descriptors / Indicators: Grimacing;Guarding Pain Intervention(s): Limited activity within patient's tolerance;Monitored during session     Hand Dominance     Extremity/Trunk Assessment Upper Extremity Assessment Upper Extremity Assessment: RUE deficits/detail;LUE deficits/detail RUE Deficits / Details: edema in arm, fingers extended, able to move PROM with increased extensor tone noted RUE Coordination: decreased fine motor;decreased gross motor LUE Deficits / Details: AROM grossly WFL. Significant edema LUE Coordination: decreased fine motor;decreased gross motor   Lower Extremity Assessment Lower Extremity Assessment: Defer to PT evaluation   Cervical / Trunk Assessment Cervical / Trunk  Assessment: Kyphotic   Communication Communication Communication: No difficulties   Cognition Arousal/Alertness: Awake/alert Behavior During Therapy: Flat affect Overall Cognitive Status: History of cognitive impairments - at baseline                                     General Comments       Exercises     Shoulder Instructions      Home Living Family/patient expects to be discharged to:: Skilled nursing facility                                         Prior Functioning/Environment Level of Independence: Needs assistance  Gait / Transfers Assistance Needed: previous notes from 3 months ago state pt required A to transfer to w/c which he used for mobility, needed assist for all ADL's including feeding ADL's / Homemaking Assistance Needed: Pt's family reporting that pt required assistance for all ADLs including self feeding            OT Problem List: Decreased strength;Decreased activity tolerance;Decreased range of motion;Impaired balance (sitting and/or standing);Decreased knowledge of use of DME or AE;Decreased knowledge of precautions;Cardiopulmonary status limiting activity;Decreased cognition;Decreased safety awareness;Decreased coordination;Impaired UE functional use;Increased edema      OT Treatment/Interventions: Self-care/ADL training;Therapeutic exercise;Energy conservation;DME and/or AE instruction;Therapeutic activities;Patient/family education    OT Goals(Current goals can be found in the care plan section) Acute Rehab OT Goals Patient Stated Goal: family agreeable to SNF [name] per chart OT Goal Formulation: With patient Time For Goal Achievement: 03/05/19 Potential to Achieve Goals: Good  OT Frequency: Min 2X/week   Barriers to D/C:            Co-evaluation PT/OT/SLP Co-Evaluation/Treatment: Yes Reason for Co-Treatment: Complexity of the patient's impairments (multi-system involvement);Necessary to address cognition/behavior during functional activity;For patient/therapist safety;To address functional/ADL transfers   OT goals addressed during session: ADL's and self-care      AM-PAC OT "6 Clicks" Daily Activity     Outcome Measure Help from another person eating meals?: Total Help from another person taking care of personal grooming?: Total Help from another person toileting, which includes using toliet, bedpan, or urinal?: Total Help from another person bathing (including  washing, rinsing, drying)?: Total Help from another person to put on and taking off regular upper body clothing?: Total Help from another person to put on and taking off regular lower body clothing?: Total 6 Click Score: 6   End of Session Nurse Communication: Mobility status;Other (comment)(Urine on sheets)  Activity Tolerance: Patient limited by fatigue;Patient limited by lethargy;Patient limited by pain Patient left: in bed;with call bell/phone within reach;with bed alarm set;with nursing/sitter in room  OT Visit Diagnosis: Unsteadiness on feet (R26.81);Other abnormalities of gait and mobility (R26.89);Muscle weakness (generalized) (M62.81);Pain Pain - part of body: (Generalized)                Time: 6720-9470 OT Time Calculation (min): 33 min Charges:  OT General Charges $OT Visit: 1 Visit OT Evaluation $OT Eval Moderate Complexity: 1 Mod  Brylynn Hanssen MSOT, OTR/L Acute Rehab Pager: [phone] Office: [phone]  Theodoro Grist Zenna Traister 02/19/2019, 3:58 PM

## 2019-02-20 ENCOUNTER — Inpatient Hospital Stay (HOSPITAL_COMMUNITY): Payer: Medicare Other

## 2019-02-20 DIAGNOSIS — Z515 Encounter for palliative care: Secondary | ICD-10-CM

## 2019-02-20 DIAGNOSIS — D72829 Elevated white blood cell count, unspecified: Secondary | ICD-10-CM

## 2019-02-20 DIAGNOSIS — Z66 Do not resuscitate: Secondary | ICD-10-CM

## 2019-02-20 DIAGNOSIS — E860 Dehydration: Secondary | ICD-10-CM

## 2019-02-20 DIAGNOSIS — G9341 Metabolic encephalopathy: Secondary | ICD-10-CM

## 2019-02-20 DIAGNOSIS — Z7189 Other specified counseling: Secondary | ICD-10-CM

## 2019-02-20 LAB — COMPREHENSIVE METABOLIC PANEL
ALT: 22 U/L (ref 0–44)
AST: 19 U/L (ref 15–41)
Albumin: 1.8 g/dL — ABNORMAL LOW (ref 3.5–5.0)
Alkaline Phosphatase: 115 U/L (ref 38–126)
Anion gap: 9 (ref 5–15)
BUN: 14 mg/dL (ref 8–23)
CO2: 29 mmol/L (ref 22–32)
Calcium: 8.6 mg/dL — ABNORMAL LOW (ref 8.9–10.3)
Chloride: 102 mmol/L (ref 98–111)
Creatinine, Ser: 0.82 mg/dL (ref 0.61–1.24)
GFR calc Af Amer: >60 mL/min (ref >60)
GFR calc non Af Amer: >60 mL/min (ref >60)
Glucose, Bld: 116 mg/dL — ABNORMAL HIGH (ref 70–99)
Potassium: 3.7 mmol/L (ref 3.5–5.1)
Sodium: 140 mmol/L (ref 135–145)
Total Bilirubin: 0.3 mg/dL (ref 0.3–1.2)
Total Protein: 5.2 g/dL — ABNORMAL LOW (ref 6.5–8.1)

## 2019-02-20 LAB — GLUCOSE, CAPILLARY
Glucose-Capillary: 104 mg/dL — ABNORMAL HIGH (ref 70–99)
Glucose-Capillary: 119 mg/dL — ABNORMAL HIGH (ref 70–99)
Glucose-Capillary: 117 mg/dL — ABNORMAL HIGH (ref 70–99)
Glucose-Capillary: 90 mg/dL (ref 70–99)

## 2019-02-20 LAB — CBC
HCT: 31.5 % — ABNORMAL LOW (ref 39.0–52.0)
Hemoglobin: 10.6 g/dL — ABNORMAL LOW (ref 13.0–17.0)
MCH: 31.7 pg (ref 26.0–34.0)
MCHC: 33.7 g/dL (ref 30.0–36.0)
MCV: 94.3 fL (ref 80.0–100.0)
Platelets: 375 10*3/uL (ref 150–400)
RBC: 3.34 MIL/uL — ABNORMAL LOW (ref 4.22–5.81)
RDW: 13.6 % (ref 11.5–15.5)
WBC: 15.8 10*3/uL — ABNORMAL HIGH (ref 4.0–10.5)
nRBC: 0 % (ref 0.0–0.2)

## 2019-02-20 LAB — MAGNESIUM: Magnesium: 1.9 mg/dL (ref 1.7–2.4)

## 2019-02-20 MED ORDER — LEVETIRACETAM IN NACL 500 MG/100ML IV SOLN
500.0000 mg | Freq: Two times a day (BID) | INTRAVENOUS | Status: DC
  Administered 2019-02-20: 500 mg via INTRAVENOUS
  Filled 2019-02-20 (×2): qty 100

## 2019-02-20 MED ORDER — WHITE PETROLATUM EX OINT
TOPICAL_OINTMENT | CUTANEOUS | Status: AC
  Administered 2019-02-20
  Filled 2019-02-20: qty 28.35

## 2019-02-20 MED ORDER — INSULIN ASPART 100 UNIT/ML ~~LOC~~ SOLN
0.0000 [IU] | SUBCUTANEOUS | Status: DC
  Administered 2019-02-21: 2 [IU] via SUBCUTANEOUS
  Administered 2019-02-21 – 2019-02-22 (×2): 1 [IU] via SUBCUTANEOUS
  Administered 2019-02-23: 3 [IU] via SUBCUTANEOUS
  Administered 2019-02-23: 2 [IU] via SUBCUTANEOUS
  Administered 2019-02-23: 1 [IU] via SUBCUTANEOUS
  Administered 2019-02-24: 3 [IU] via SUBCUTANEOUS

## 2019-02-20 MED ORDER — INSULIN GLARGINE 100 UNIT/ML ~~LOC~~ SOLN
10.0000 [IU] | Freq: Two times a day (BID) | SUBCUTANEOUS | Status: DC
  Administered 2019-02-21 – 2019-02-24 (×6): 10 [IU] via SUBCUTANEOUS
  Filled 2019-02-20 (×10): qty 0.1

## 2019-02-20 MED ORDER — PHENYTOIN SODIUM 50 MG/ML IJ SOLN
100.0000 mg | Freq: Two times a day (BID) | INTRAMUSCULAR | Status: DC
  Administered 2019-02-20 – 2019-02-21 (×4): 100 mg via INTRAVENOUS
  Filled 2019-02-20 (×5): qty 2

## 2019-02-20 MED ORDER — PHENYTOIN SODIUM EXTENDED 100 MG PO CAPS: 200.0000 mg | ORAL_CAPSULE | Freq: Every day | ORAL | Status: DC

## 2019-02-20 MED ORDER — PANTOPRAZOLE SODIUM 40 MG IV SOLR
40.0000 mg | INTRAVENOUS | Status: DC
  Administered 2019-02-20 – 2019-02-23 (×4): 40 mg via INTRAVENOUS
  Filled 2019-02-20 (×4): qty 40

## 2019-02-20 NOTE — Progress Notes (Signed)
Palliative Note:  Referral received for goals of care discussion. I have assessed patient and spoke with this bedside RN.   Voicemail has been left with patient's sister Jasmine December. Will await a return call to have further goals of care discussion and offer recommendations.   Detailed note and recommendations will be completed once discussion has taken place with POA.   Thank you for your referral.   Willette Alma, AGPCNP-BC Palliative Medicine Team  Phone: [phone] Pager: [phone] Amion: N. Cousar    NO CHARGE

## 2019-02-20 NOTE — Consult Note (Signed)
Consultation Note Date: 02/20/2019   Patient Name: Arthur Berry  DOB: 30-Jan-1946  MRN: [id number]  Age / Sex: 73 y.o., male   PCP: Zeb Comfort Referring Physician: Zigmund Daniel., *   REASON FOR CONSULTATION:Establishing goals of care  Palliative Care consult requested for this 73 y.o. male with multiple medical problems including dementia, type 2 diabetes, epilepsy, hypertension, GERD, CVA (2016), and hyperlipidemia. He was admitted from his long-term facility [facility]) due to lethargy, generalized weakness, and decreased appetite. Since admission he has received IV antibiotics for sepsis related to meta pneumovirus, IV hydration for dehydration and hypernatremia, along with electrolyte replacement. He remains lethargic.   I have reviewed medical records including lab results, imaging, Epic notes, and MAR, received report from the bedside RN, and assessed the patient. I spoke with patient's sister, Nita Sickle and brother Mishael Massena via phone due to visitor restrictions related to the current COVID-19 pandemic. We discussed diagnosis prognosis, GOC, EOL wishes, disposition and options.  I introduced Palliative Medicine as specialized medical care for people living with serious illness. It focuses on providing relief from the symptoms and stress of a serious illness. The goal is to improve quality of life for both the patient and the family.  Family reports patient has been a long-term resident of multiple facilities over the past 6-9 months. Sister reports he was in [facility] for rehab after suffering a fall. Prior to placement at [facility] he was residing by himself, and unfortunately as his dementia progressed family was unable to manage him in the home. Family reports patient has declined over the past month with decrease appetite, less verbal, more episodes of unable to recognize family, becoming completely wheelchair/bed bound and requiring more assistance.  Patient's brother recently purchased him a specialized wheelchair he stated to assist patient with being able to sit upright and get out of bed.   We discussed  His current illness and what it means in the larger context of his on-going co-morbidities. With specific discussions focused on his advancing dementia, overall functional and nutritional state with regards to his quality of life. Natural disease trajectory and expectations at EOL were discussed.  I attempted to elicit values and goals of care important to the patient.    The difference between aggressive medical intervention and comfort care was considered in light of the patient's goals of care. I educated family on what comfort care measures would look like.   Sister tearful during conversation and expressed their main concern was to make sure their brother was not suffering and that he would be at peace and pain free. Brother expressed they cared for their parents who both passed away with Alzheimer's and their sister who passed away this past 03-02-2020with similar conditions as Mr. Leverich. They expressed patient is a [place] and they are prepared to allow him to pass away and not continue in his current state as he would not be happy with his current quality of life.   Family realistic and verbalized awareness of patient's condition. They mutually agree that PT/OT will not change his chronic and underlying conditions and would not want to put him through further aggressive therapy knowing they will end up in the same place again and having to make a decision for comfort. Sister expressed this is not what they want and this has been a continuous cycle for months and do not wish to continue putting their brother through this. Support provided.  Family confirms patient has a advance directive and both his sister Nita Sickle and brother Zackery Brine share HCPOA responsibilities. They confirm wishes for DNR/DNI. I explained MOST form,  however due to COVID restrictions family unable to come and sign form. I did explain I would make notation of their wishes for no heroic measures, no artificial feedings, no rehospitalizations with goal to treat in place and provide comfort measures only.   Hospice and Palliative Care services outpatient were explained and offered. Family verbalized their understanding and awareness of both palliative and hospice goals and philosophy of care. At this time family expresses their wishes is for patient to be kept comfortable upon returning to facility with the support of hospice for EOL care.   Questions and concerns were addressed.  The family was encouraged to call with questions or concerns.  PMT will continue to support holistically.   SOCIAL HISTORY:     reports that he quit smoking about 32 years ago. His smoking use included cigars. He has never used smokeless tobacco. He reports that he does not drink alcohol or use drugs.  ADVANCE DIRECTIVES:  Primary Decision Maker: Nita Sickle (sister) and Jayde Daffin (brother)  HCPOA: YES  No artificial feedings or aggressive medical interventions     CODE STATUS: DNR  SYMPTOM MANAGEMENT: Per attending    PSYCHO-SOCIAL/SPIRITUAL:  Support System: Family   Desire for further Chaplaincy support:NO    PAST MEDICAL HISTORY: Past Medical History:  Diagnosis Date  . Adjustment reaction with aggression 05/14/2017  . Arthritis   . Chronic kidney disease   . Colon polyp 2005   Tubular Adenoma  . Coronary artery disease, non-occlusive    Minimal nonobstructive CAD, nl LV systolic fxn by cath 12/11/12  . Diabetes mellitus 2001   TYPE 2  . Diverticulosis   . Epilepsy (HCC)   . GERD (gastroesophageal reflux disease) 04/23/2014  . Goiter, nontoxic, multinodular    Thyroid US 11/2012  . Hyperlipidemia 1999  . Hypertension 1999  . Internal hemorrhoids   . Neuromuscular disorder (HCC)   . OSA (obstructive sleep apnea)   . Prostate  enlargement   . Sleep apnea   . Splinter 04/27/13   metal plinter removed rt pointer finger  . Stroke (HCC) 06/2015  . Vision changes 06/2015   since CVA    PAST SURGICAL HISTORY:  Past Surgical History:  Procedure Laterality Date  . CHOLECYSTECTOMY    . COLONOSCOPY  12/28/2009  . ESOPHAGOGASTRODUODENOSCOPY  10/14/2012   normal   . INGUINAL HERNIA REPAIR  06/27/11   left  . KNEE SURGERY  2008   Left  . LEFT HEART CATHETERIZATION WITH CORONARY ANGIOGRAM N/A 12/11/2012   Procedure: LEFT HEART CATHETERIZATION WITH CORONARY ANGIOGRAM;  Surgeon: Tonny Bollman, MD;  Location: [facility] CATH LAB;  Service: Cardiovascular;  Laterality: N/A;  . TOTAL HIP ARTHROPLASTY Right 10/23/2017   Procedure: TOTAL HIP ARTHROPLASTY ANTERIOR APPROACH;  Surgeon: Kathryne Hitch, MD;  Location: [facility] OR;  Service: Orthopedics;  Laterality: Right;    ALLERGIES:  has No Known Allergies.  MEDICATIONS:  Current Facility-Administered Medications  Medication Dose Route Frequency Provider Last Rate Last Dose  . 0.45 % sodium chloride infusion   Intravenous Continuous Marzetta Board, MD 75 mL/hr at 02/18/19 1804    . acetaminophen (TYLENOL) tablet 650 mg  650 mg Oral Q6H PRN Marzetta Board, MD       Or  . acetaminophen (TYLENOL) suppository 650 mg  650 mg Rectal Q6H PRN  Marzetta BoardSpongberg, Christopher N, MD      . amLODipine (NORVASC) tablet 2.5 mg  2.5 mg Oral Daily Calvert Cantorizwan, Saima, MD   2.5 mg at 02/19/19 1038  . aspirin EC tablet 81 mg  81 mg Oral Daily Marzetta BoardSpongberg, Christopher N, MD   81 mg at 02/19/19 1036  . carvedilol (COREG) tablet 3.125 mg  3.125 mg Oral BID WC Calvert Cantorizwan, Saima, MD   3.125 mg at 02/19/19 1834  . collagenase (SANTYL) ointment   Topical Daily Spongberg, Susy Frizzlehristopher N, MD      . enoxaparin (LOVENOX) injection 40 mg  40 mg Subcutaneous Q24H Marzetta BoardSpongberg, Christopher N, MD   40 mg at 02/19/19 2048  . feeding supplement (ENSURE ENLIVE) (ENSURE ENLIVE) liquid 237 mL  237 mL Oral BID BM Zigmund DanielPowell, A  Caldwell Jr., MD      . haloperidol lactate (HALDOL) injection 2 mg  2 mg Intravenous Q6H PRN Marzetta BoardSpongberg, Christopher N, MD   2 mg at 02/17/19 1204  . insulin aspart (novoLOG) injection 0-9 Units  0-9 Units Subcutaneous Q4H Zigmund DanielPowell, A Caldwell Jr., MD      . insulin glargine (LANTUS) injection 10 Units  10 Units Subcutaneous BID Zigmund DanielPowell, A Caldwell Jr., MD      . levETIRAcetam (KEPPRA) IVPB 500 mg/100 mL premix  500 mg Intravenous Q12H Zigmund DanielPowell, A Caldwell Jr., MD 400 mL/hr at 02/20/19 1046 500 mg at 02/20/19 1046  . lisinopril (PRINIVIL,ZESTRIL) tablet 20 mg  20 mg Oral Daily Calvert Cantorizwan, Saima, MD   20 mg at 02/19/19 1036  . multivitamin with minerals tablet 1 tablet  1 tablet Oral Daily Marzetta BoardSpongberg, Christopher N, MD   1 tablet at 02/19/19 1036  . ondansetron (ZOFRAN) tablet 4 mg  4 mg Oral Q6H PRN Spongberg, Susy Frizzlehristopher N, MD       Or  . ondansetron (ZOFRAN) injection 4 mg  4 mg Intravenous Q6H PRN Marzetta BoardSpongberg, Christopher N, MD      . pantoprazole (PROTONIX) injection 40 mg  40 mg Intravenous Q24H Zigmund DanielPowell, A Caldwell Jr., MD      . phenytoin (DILANTIN) injection 100 mg  100 mg Intravenous Q12H Zigmund DanielPowell, A Caldwell Jr., MD   100 mg at 02/20/19 1043  . Resource ThickenUp Clear   Oral PRN Spongberg, Susy Frizzlehristopher N, MD      . sodium chloride flush (NS) 0.9 % injection 10-40 mL  10-40 mL Intracatheter Q12H Spongberg, Susy Frizzlehristopher N, MD   10 mL at 02/18/19 2308  . sodium chloride flush (NS) 0.9 % injection 10-40 mL  10-40 mL Intracatheter PRN Marzetta BoardSpongberg, Christopher N, MD   10 mL at 02/19/19 1632    VITAL SIGNS: BP 126/72 (BP Location: Right Arm)   Pulse 68   Temp 98.6 F (37 C) (Oral)   Resp 16   Ht 5\' 9"  (1.753 m)   Wt 82 kg   SpO2 99%   BMI 26.70 kg/m  Filed Weights   02/16/19 0300 02/18/19 0542 02/20/19 0405  Weight: 83.3 kg 82.6 kg 82 kg    Estimated body mass index is 26.7 kg/m as calculated from the following:   Height as of this encounter: 5\' 9"  (1.753 m).   Weight as of this encounter: 82 kg.   LABS: CBC:    Component Value Date/Time   WBC 15.8 (H) 02/20/2019 0429   HGB 10.6 (L) 02/20/2019 0429   HCT 31.5 (L) 02/20/2019 0429   PLT 375 02/20/2019 0429   Comprehensive Metabolic Panel:    Component Value Date/Time  NA 140 02/20/2019 0429   K 3.7 02/20/2019 0429   CO2 29 02/20/2019 0429   BUN 14 02/20/2019 0429   CREATININE 0.82 02/20/2019 0429   ALBUMIN 1.8 (L) 02/20/2019 0429     Review of Systems  Unable to perform ROS: Dementia    Physical Exam General: NAD, frail chronically-ill appearing, thin, unable to follow commands, lethargic Cardiovascular: regular rate and rhythm Pulmonary: diminished bases Abdomen: soft, nontender, + bowel sounds Extremities: no edema Skin: left hip unstageable wound  Neurological: Weakness, lethargic  Prognosis: < 6 months in the setting of advanced dementia, metabolic encephalopathy, sepsis due to meta pneumovirus, poor po intake, dehydration, protein calorie malnutrition, dysphagia, unstageable left hip pressure injury, CVA, epilepsy, GERD, hypertension, diabetes, hypokalemia, and bedbound.   Discharge Planning:  Family requesting facility [facility]) and hospice   PLAN:  DNR/DNI-as confirmed by sister/brother  Continue to treat, no escalation or aggressive care  Goal is for comfort once discharged to facility with hospice support for EOL  No artificial feedings, rehospitalizations  Discussed with Morrie Sheldon, CM/SW regarding facility placement with hospice. No further needs for PT/OT  PMT will continue to support and follow       Palliative Performance Scale: PPS 20%              Sister Jasmine December and brother [name] expressed understanding and was in agreement with this plan.   Thank you for allowing the Palliative Medicine Team to assist in the care of this patient.   Time In: 1300 Time Out: 1430 Total Time 90 min. Prolonged Time Billed YES     Visit consisted of counseling and education dealing with the  complex and emotionally intense issues of symptom management and palliative care in the setting of serious and potentially life-threatening illness.Greater than 50%  of this time was spent counseling and coordinating care related to the above assessment and plan.  Signed by:  Willette Alma, AGPCNP-BC Palliative Medicine Team  Phone: [phone] Fax: [phone] Pager: [phone] Amion: Thea Alken

## 2019-02-20 NOTE — Progress Notes (Signed)
[facility] called CSW back and report patient can go back to them with hospice following whenever medically ready to dc from the hospital.   Rolland Colony, LCSW [phone]

## 2019-02-20 NOTE — Progress Notes (Signed)
CSW consulted with patient's sister Jasmine December following palliative consult. She reports understanding that patient will need hospice following, CSW informed her of limited options of placement as patient does not qualify for SNF rehab and does not yet qualify for residential hospice. She is in agreement with patient returning to [facility] with Hospice following.   CSW has attempted multiple times to reach [facility] since 4/7, has received no call back. Will continue to attempt to contact to ensure patient can return with hospice following.   [place], [place] [phone]

## 2019-02-20 NOTE — Progress Notes (Signed)
Attempted 0800 and 1000 oral medications. Patient not alert enough to take oral medication at this time. Dr. Lowell Guitar aware. Will continue to monitor.

## 2019-02-20 NOTE — Progress Notes (Addendum)
PROGRESS NOTE    CHE CISSE  PPG:[id number] DOB: June 08, 1946 DOA: 02/13/2019 PCP: Zeb Comfort   Brief Narrative:  Arthur Berry is Arthur Berry 73 y.o.malewith medical history significant oftype 2 diabetes on insulin and metformin, dementia with behavioral issues, HTN, seizure disorder, long-term nursing home resident, hypertension, history of seizure who is brought to the emergency room with altered mental status. At baseline, patient is total care and is risk of fall on attempted ambulation. He has some behavior disturbances due to cognitive dysfunction as per his brother.  in ED : temp 100.2, WBC 16.3, RR was up to 23, BUN 40, Cr 1.35, Glucose 323, Sodium 149, CXR showed clear lungs.   4/3- Metapneumovirus +   Assessment & Plan:   Principal Problem:   Sepsis (HCC) Active Problems:   Diabetes mellitus without complication (HCC)   HLD (hyperlipidemia)   Essential hypertension, benign   GERD (gastroesophageal reflux disease)   CVA (cerebral vascular accident) (HCC)   Epilepsy (HCC)   Acute metabolic encephalopathy   Dehydration with hypernatremia   Altered mental status   Pressure injury of skin    Sepsis due to meta pneumovirus - blood cultures, UA and Urine culture neg - no infiltrates on CXR - not hypoxic - leukocytosis improving - S/p 5 days vanc/cefepime    Dehydration with hypernatremia - has resolved with IV hydration  Acute metabolic encephalopathy with underlying dementia - due to infection and electrolyte abnormalities - unclear what his baseline is as family has not been here to see how he's doing, but sounds like he's typically disoriented and with poor short term memory (ex: he doesn't remember his meals, but does remember his siblings).  Doesn't typically know date either.  Per his sister's report, in march it seemed like his vision was very poor.  His brother noted that he does sleep Arthur Berry lot and he didn't typically say much when he visited him.  He has  persistent lethargy which has limited his ability to take meds and participate with staff.  - Potentially sedating meds are on hold -> keppra and dilantin currently continued as these were PTA meds for seizures (will discuss possible adjustments with neurology) - Lexapro, Galantamine, Remeron, Risperdal and Ativan are on hold  Goals of Care: Pt lethargic since I've been taking care of him, unclear how far this is from baseline.  On review of chart, appears like this has been the case since his admission.  Inconsistent PO intake related to this.  We've asked palliative care to help with discussion of goals of care in this setting.   Hypokalemia - replace and follow    Diabetes mellitus without complication  - Continue lantus 10 units BID  - q4 SSI until pt PO intake more reliable    Essential hypertension, benign - resume Lisinopril Amlodipine and Coreg today    GERD (gastroesophageal reflux disease) - on Protonix daily    CVA (cerebral vascular accident) - on Aspirin as outpt    Epilepsy (HCC) -cont Keppra and Depakote (will discuss dosing with neurology in setting of lethargy) - addendum, discussed with neurology, will stop keppra and continue to monitor, follow EEG (discussed with neurology).  Pt with hx sz since he was child.  Last seizure years ago.  Poor oral intake - appreciate RD input   Dysphagia - dysphagia 2, nectar thick liquid, see speech     Pressure injury of skin  - Left hip, unstageable.  Initially had considered surgery c/s for debridement, but  appears improved from initial photos taken.  Doesn't appear grossly infected or appear to need surgical debridement at this time.  Will continue to monitor.     DVT prophylaxis: lovenox Code Status: DNR Family Communication: discussed with sister Disposition Plan: pending SNF placement  Consultants:   none  Procedures:  none  Antimicrobials:  Anti-infectives (From admission, onward)   Start      Dose/Rate Route Frequency Ordered Stop   02/17/19 1000  vancomycin (VANCOCIN) IVPB 750 mg/150 ml premix  Status:  Discontinued     750 mg 150 mL/hr over 60 Minutes Intravenous Every 12 hours 02/16/19 2233 02/18/19 0836   02/14/19 1000  ceFEPIme (MAXIPIME) 2 g in sodium chloride 0.9 % 100 mL IVPB  Status:  Discontinued     2 g 200 mL/hr over 30 Minutes Intravenous Every 24 hours 02/13/19 1214 02/14/19 0848   02/14/19 1000  vancomycin (VANCOCIN) 1,250 mg in sodium chloride 0.9 % 250 mL IVPB  Status:  Discontinued     1,250 mg 166.7 mL/hr over 90 Minutes Intravenous Every 24 hours 02/13/19 1214 02/14/19 0854   02/14/19 1000  ceFEPIme (MAXIPIME) 2 g in sodium chloride 0.9 % 100 mL IVPB  Status:  Discontinued     2 g 200 mL/hr over 30 Minutes Intravenous Every 12 hours 02/14/19 0848 02/18/19 0836   02/14/19 1000  vancomycin (VANCOCIN) 1,000 mg in sodium chloride 0.9 % 250 mL IVPB  Status:  Discontinued     1,000 mg 250 mL/hr over 60 Minutes Intravenous Every 12 hours 02/14/19 0854 02/16/19 2233   02/13/19 1000  vancomycin (VANCOCIN) IVPB 1000 mg/200 mL premix  Status:  Discontinued     1,000 mg 200 mL/hr over 60 Minutes Intravenous  Once 02/13/19 0956 02/13/19 0959   02/13/19 1000  ceFEPIme (MAXIPIME) 2 g in sodium chloride 0.9 % 100 mL IVPB     2 g 200 mL/hr over 30 Minutes Intravenous  Once 02/13/19 0956 02/13/19 1106   02/13/19 1000  vancomycin (VANCOCIN) 1,500 mg in sodium chloride 0.9 % 500 mL IVPB     1,500 mg 250 mL/hr over 120 Minutes Intravenous  Once 02/13/19 0959 02/15/19 0817     Subjective: Lethargic. Answers questions intermittently.   Objective: Vitals:   02/19/19 1049 02/19/19 2146 02/20/19 0405 02/20/19 0620  BP: 130/74 (!) 143/69  138/73  Pulse: 95 (!) 106  89  Resp: 18 16  17   Temp: 98.6 F (37 C) 98.5 F (36.9 C)  98.9 F (37.2 C)  TempSrc: Oral Oral  Oral  SpO2: 99% 97%  97%  Weight:   82 kg   Height:        Intake/Output Summary (Last 24 hours) at  02/20/2019 1117 Last data filed at 02/20/2019 [id number] Gross per 24 hour  Intake 280 ml  Output 1740 ml  Net -1460 ml   Filed Weights   02/16/19 0300 02/18/19 0542 02/20/19 0405  Weight: 83.3 kg 82.6 kg 82 kg    Examination:  General: No acute distress. Cardiovascular: Heart sounds show Arthur Berry regular rate, and rhythm. Lungs: Clear to auscultation bilaterally  Abdomen: Soft, nontender, nondistended  Neurological: lethargic.  Withdraws from painful stimuli with all extremities.  Inconsistently follows commands. Skin: unstageable L hip decubitus ulcer, appears improved from prior images, does not appear infected   Data Reviewed: I have personally reviewed following labs and imaging studies  CBC: Recent Labs  Lab 02/15/19 0339 02/16/19 0916 02/17/19 0650 02/18/19 0329 02/20/19 0429  WBC 13.6*  13.3* 14.1* 12.5* 15.8*  NEUTROABS 9.9* 9.0* 9.5* 8.3*  --   HGB 10.2* 10.0* 10.0* 10.3* 10.6*  HCT 31.4* 30.3* 29.1* 31.2* 31.5*  MCV 96.6 94.1 91.8 91.2 94.3  PLT 330 312 318 370 375   Basic Metabolic Panel: Recent Labs  Lab 02/16/19 0916 02/17/19 0650 02/18/19 0329 02/19/19 0403 02/20/19 0429  NA 142 136 138 137 140  K 3.2* 2.9* 3.0* 3.4* 3.7  CL 111 103 103 101 102  CO2 GLUCOSE 181* 131* 176* 283* 116*  BUN 12 9 6* 10 14  CREATININE 0.78 0.67 0.64 0.77 0.82  CALCIUM 8.5* 8.1* 8.2* 8.3* 8.6*  MG  --   --  1.4* 1.9 1.9   GFR: Estimated Creatinine Clearance: 80.2 mL/min (by C-G formula based on SCr of 0.82 mg/dL). Liver Function Tests: Recent Labs  Lab 02/20/19 0429  AST 19  ALT 22  ALKPHOS 115  BILITOT 0.3  PROT 5.2*  ALBUMIN 1.8*   No results for input(s): LIPASE, AMYLASE in the last 168 hours. No results for input(s): AMMONIA in the last 168 hours. Coagulation Profile: No results for input(s): INR, PROTIME in the last 168 hours. Cardiac Enzymes: No results for input(s): CKTOTAL, CKMB, CKMBINDEX, TROPONINI in the last 168 hours. BNP (last 3  results) No results for input(s): PROBNP in the last 8760 hours. HbA1C: No results for input(s): HGBA1C in the last 72 hours. CBG: Recent Labs  Lab 02/19/19 0747 02/19/19 1245 02/19/19 1741 02/19/19 2140 02/20/19 0758  GLUCAP 238* 278* 193* 276* 104*   Lipid Profile: No results for input(s): CHOL, HDL, LDLCALC, TRIG, CHOLHDL, LDLDIRECT in the last 72 hours. Thyroid Function Tests: No results for input(s): TSH, T4TOTAL, FREET4, T3FREE, THYROIDAB in the last 72 hours. Anemia Panel: No results for input(s): VITAMINB12, FOLATE, FERRITIN, TIBC, IRON, RETICCTPCT in the last 72 hours. Sepsis Labs: Recent Labs  Lab 02/13/19 1445  LATICACIDVEN 1.2    Recent Results (from the past 240 hour(s))  Blood Culture (routine x 2)     Status: None   Collection Time: 02/13/19 10:02 AM  Result Value Ref Range Status   Specimen Description BLOOD RIGHT ANTECUBITAL  Final   Special Requests   Final    BOTTLES DRAWN AEROBIC AND ANAEROBIC Blood Culture adequate volume   Culture   Final    NO GROWTH 5 DAYS Performed at [facility], [address], [place], [place] [address]    Report Status 02/18/2019 FINAL  Final  Blood Culture (routine x 2)     Status: None   Collection Time: 02/13/19 10:30 AM  Result Value Ref Range Status   Specimen Description BLOOD LEFT HAND  Final   Special Requests   Final    BOTTLES DRAWN AEROBIC ONLY Blood Culture results may not be optimal due to an inadequate volume of blood received in culture bottles   Culture   Final    NO GROWTH 5 DAYS Performed at [facility], [address], [place], [place] [address]    Report Status 02/18/2019 FINAL  Final  Urine culture     Status: None   Collection Time: 02/13/19 10:30 AM  Result Value Ref Range Status   Specimen Description URINE, CLEAN CATCH  Final   Special Requests NONE  Final   Culture   Final    NO GROWTH Performed at [facility], [address] [place], [place] [address]    Report  Status 02/14/2019 FINAL  Final  Novel Coronavirus, NAA (hospital order; send-out to ref lab)     Status: None   Collection Time: 02/13/19  6:30 PM  Result Value Ref Range Status   SARS-CoV-2, NAA NOT DETECTED NOT DETECTED Final    Comment: Negative (Not Detected) results do not exclude infection caused by SARS CoV 2 and should not be used as the sole basis for treatment or other patient management decisions. Optimum specimen types and timing for peak viral levels during infections caused  by SARS CoV 2 have not been determined. Collection of multiple specimens (types and time points) from the same patient may be necessary to detect the virus. Improper specimen collection and handling, sequence variability underlying assay primers and or probes, or the presence of organisms in  quantities less than the limit of detection of the assay may lead to false negative results. Positive and negative predictive values of testing are highly dependent on prevalence. False negative results are more likely when prevalence of disease is high. (NOTE) The expected result is Negative (Not Detected). The SARS CoV 2 test is intended for the presumptive qualitative  detection of nucleic acid from SARS CoV 2 in upper and lower  respir atory specimens. Testing methodology is real time RT PCR. Test results must be correlated with clinical presentation and  evaluated in the context of other laboratory and epidemiologic data.  Test performance can be affected because the epidemiology and  clinical spectrum of infection caused by SARS CoV 2 is not fully  known. For example, the optimum types of specimens to collect and  when during the course of infection these specimens are most likely  to contain detectable viral RNA may not be known. This test has not been Food and Drug Administration (FDA) cleared or  approved and has been authorized by FDA under an Emergency Use  Authorization (EUA). The test is only authorized  for the duration of  the declaration that circumstances exist justifying the authorization  of emergency use of in vitro diagnostic tests for detection and or  diagnosis of SARS CoV 2 under Section 564(b)(1) of the Act, 21 U.S.C.  section [phone] 3(b)(1), unless the authorization is terminated or   revoked sooner. [facility] is certified under the  Clinical Laboratory Improvement Amendments of 1988 (CLIA), 42 U.S.C.  section [phone], to perform high complexity tests. Performed at [facility] CLIA [id number] [address], [place], [place] [address] Laboratory Director: Turner Daniels, MD    Coronavirus Source NASOPHARYNGEAL  Final    Comment: Performed at [facility], [address] [place], [place] [address]  MRSA PCR Screening     Status: Abnormal   Collection Time: 02/13/19  8:55 PM  Result Value Ref Range Status   MRSA by PCR POSITIVE (Felicia Both) NEGATIVE Final    Comment:        The GeneXpert MRSA Assay (FDA approved for NASAL specimens only), is one component of [facility] comprehensive MRSA colonization surveillance program. It is not intended to diagnose MRSA infection nor to guide or monitor treatment for MRSA infections. RESULT CALLED TO, READ BACK BY AND VERIFIED WITHGavin Potters RN 02/13/19 2337 JDW Performed at [facility], [address], [place], [place] [address]   Respiratory Panel by PCR     Status: Abnormal   Collection Time: 02/14/19  8:08 AM  Result Value Ref Range Status   Adenovirus NOT DETECTED NOT  DETECTED Final   Coronavirus 229E NOT DETECTED NOT DETECTED Final    Comment: (NOTE) The Coronavirus on the Respiratory Panel, DOES NOT test for the novel  Coronavirus (2019 nCoV)    Coronavirus HKU1 NOT DETECTED NOT DETECTED Final   Coronavirus NL63 NOT DETECTED NOT DETECTED Final   Coronavirus OC43 NOT DETECTED NOT DETECTED Final   Metapneumovirus NOT DETECTED NOT DETECTED Final   Rhinovirus /  Enterovirus DETECTED (Sharisse Rantz) NOT DETECTED Final   Influenza Sayge Salvato NOT DETECTED NOT DETECTED Final   Influenza B NOT DETECTED NOT DETECTED Final   Parainfluenza Virus 1 NOT DETECTED NOT DETECTED Final   Parainfluenza Virus 2 NOT DETECTED NOT DETECTED Final   Parainfluenza Virus 3 NOT DETECTED NOT DETECTED Final   Parainfluenza Virus 4 NOT DETECTED NOT DETECTED Final   Respiratory Syncytial Virus NOT DETECTED NOT DETECTED Final   Bordetella pertussis NOT DETECTED NOT DETECTED Final   Chlamydophila pneumoniae NOT DETECTED NOT DETECTED Final   Mycoplasma pneumoniae NOT DETECTED NOT DETECTED Final    Comment: Performed at [facility], [address] [place], [place] [address]         Radiology Studies: No results found.      Scheduled Meds:  amLODipine  2.5 mg Oral Daily   aspirin EC  81 mg Oral Daily   carvedilol  3.125 mg Oral BID WC   collagenase   Topical Daily   enoxaparin (LOVENOX) injection  40 mg Subcutaneous Q24H   feeding supplement (ENSURE ENLIVE)  237 mL Oral BID BM   insulin aspart  0-15 Units Subcutaneous TID WC   insulin aspart  0-5 Units Subcutaneous QHS   insulin glargine  13 Units Subcutaneous BID   lisinopril  20 mg Oral Daily   multivitamin with minerals  1 tablet Oral Daily   pantoprazole (PROTONIX) IV  40 mg Intravenous Q24H   phenytoin (DILANTIN) IV  100 mg Intravenous Q12H   sodium chloride flush  10-40 mL Intracatheter Q12H   Continuous Infusions:  sodium chloride 75 mL/hr at 02/18/19 1804   levETIRAcetam 500 mg (02/20/19 1046)     LOS: 7 days    Time spent: over 30 min    Lacretia Nicks, MD [facility] Pager AMION  If 7PM-7AM, please contact night-coverage [email or web address] Password [email or web address] 02/20/2019, 11:17 AM

## 2019-02-20 NOTE — Progress Notes (Signed)
EEG Complete:  Pending Results 

## 2019-02-20 NOTE — Progress Notes (Signed)
MD made CSW aware that patient's sister Jasmine December wanted to speak with CSW.   CSW consulted with Jasmine December regarding patient's plan of care and notified her that palliative consult was in and that once their assessment is complete they will notify family of recommendations. Jasmine December expressed she does not want patient to go to back to [facility] and stated preference of SNF as [facility]. CSW made Jasmine December aware of [facility] declining to take patient due to wandering behaviors, and that only memory care SNF is [facility] in which they are taking no new patients at this time. Jasmine December stated there is no family patient can stay with as he has multiple brothers and sisters but all are older with health issues. Jasmine December is unsure of where patient will be discharged to and is hopeful of a direction of discharge plan from Palliative. Jasmine December expresses understanding her brother is in poor healht and she wishes he was not suffering any longer.   CSW will continue to assist in discharge placement pending Palliative consult and recs.    [place], [place] [phone]

## 2019-02-21 DIAGNOSIS — I639 Cerebral infarction, unspecified: Secondary | ICD-10-CM

## 2019-02-21 LAB — BASIC METABOLIC PANEL
Anion gap: 10 (ref 5–15)
BUN: 10 mg/dL (ref 8–23)
CO2: 29 mmol/L (ref 22–32)
Calcium: 8.5 mg/dL — ABNORMAL LOW (ref 8.9–10.3)
Chloride: 100 mmol/L (ref 98–111)
Creatinine, Ser: 0.72 mg/dL (ref 0.61–1.24)
GFR calc Af Amer: >60 mL/min (ref >60)
GFR calc non Af Amer: >60 mL/min (ref >60)
Glucose, Bld: 146 mg/dL — ABNORMAL HIGH (ref 70–99)
Potassium: 3.5 mmol/L (ref 3.5–5.1)
Sodium: 139 mmol/L (ref 135–145)

## 2019-02-21 LAB — CBC
HCT: 30.2 % — ABNORMAL LOW (ref 39.0–52.0)
Hemoglobin: 10.1 g/dL — ABNORMAL LOW (ref 13.0–17.0)
MCH: 31.7 pg (ref 26.0–34.0)
MCHC: 33.4 g/dL (ref 30.0–36.0)
MCV: 94.7 fL (ref 80.0–100.0)
Platelets: 329 10*3/uL (ref 150–400)
RBC: 3.19 MIL/uL — ABNORMAL LOW (ref 4.22–5.81)
RDW: 13.2 % (ref 11.5–15.5)
WBC: 12.4 10*3/uL — ABNORMAL HIGH (ref 4.0–10.5)
nRBC: 0 % (ref 0.0–0.2)

## 2019-02-21 LAB — GLUCOSE, CAPILLARY
Glucose-Capillary: 129 mg/dL — ABNORMAL HIGH (ref 70–99)
Glucose-Capillary: 157 mg/dL — ABNORMAL HIGH (ref 70–99)
Glucose-Capillary: 158 mg/dL — ABNORMAL HIGH (ref 70–99)
Glucose-Capillary: 168 mg/dL — ABNORMAL HIGH (ref 70–99)
Glucose-Capillary: 93 mg/dL (ref 70–99)
Glucose-Capillary: 93 mg/dL (ref 70–99)

## 2019-02-21 MED ORDER — ENSURE ENLIVE PO LIQD
237.0000 mL | Freq: Three times a day (TID) | ORAL | Status: DC
  Administered 2019-02-23 – 2019-02-24 (×2): 237 mL via ORAL

## 2019-02-21 MED ORDER — LORAZEPAM 2 MG/ML IJ SOLN: 0.5000 mg | Freq: Once | INTRAMUSCULAR | Status: DC | PRN

## 2019-02-21 NOTE — Progress Notes (Signed)
Pt did not want the shake, pt asked for sprite. Pt took down half a cup of thickened sprite.

## 2019-02-21 NOTE — Progress Notes (Signed)
  Speech Language Pathology Treatment: Dysphagia  Patient Details Name: Arthur Berry MRN: [id number] DOB: May 14, 1946 Today's Date: 02/21/2019 Time: 1245-8099 SLP Time Calculation (min) (ACUTE ONLY): 8 min  Assessment / Plan / Recommendation Clinical Impression  Pt presents with stable function, consistent with last observation with PO. Pt continues to need moderate verbal and tactile cueing to raise awareness to cup/spoon/straw. Tolerating nectar thick liquids adequately, observed with Ensure today and he was also able to sip from a straw without cough. Thin water still elicited an immediate cough. Pt is able to masticate soft solids but intake appears poor. He may not sustain arousal or attention through meals. Recommend pt continue current diet; will plan to f/u 1x a week while admitted given potential for ongoing improvement, though suspect this diet will be needed through admission.    HPI HPI: Arthur Berry is a 73 y.o. male with medical history significant of type 2 diabetes on insulin and metformin, dementia, long-term nursing home resident, hypertension, history of seizure who is brought to the emergency room with altered mental status. Found to have Sepsis, reportedly pt had negative COVID-19 test within a week, but no documentation. Current test pending. Reportedly concern from RN and family regarding pts swallowing, But SIL reports pt was able to eat and drink well, but needed asssit with self feeding at facility, was on finger foods. Rn reprots difficulty feeding pt due to oral holding and anterior spillage. Prior SLP notes from 10/2018 reports recent fall with mild head injury, recommended to consume regular/thin diet, swallow appeared [facility]       SLP Plan  Continue with current plan of care       Recommendations  Diet recommendations: Dysphagia 2 (fine chop);Nectar-thick liquid Liquids provided via: Straw;Cup Medication Administration: Whole meds with puree Supervision: Full  supervision/cueing for compensatory strategies Compensations: Slow rate;Small sips/bites;Multiple dry swallows after each bite/sip Postural Changes and/or Swallow Maneuvers: Seated upright 90 degrees                Oral Care Recommendations: Oral care BID Follow up Recommendations: Skilled Nursing facility SLP Visit Diagnosis: Dysphagia, oropharyngeal phase (R13.12) Plan: Continue with current plan of care       GO               Arthur Ditty, MA CCC-SLP  Acute Rehabilitation Services Pager [phone] Office [phone]  Arthur Berry 02/21/2019, 10:31 AM

## 2019-02-21 NOTE — Progress Notes (Signed)
Occupational Therapy Treatment Patient Details Name: Arthur Berry MRN: [id number] DOB: 1946-04-07 Today's Date: 02/21/2019    History of present illness Arthur Berry is a 73 y.o. male with medical history significant of type 2 diabetes on insulin and metformin, dementia, long-term nursing home resident, hypertension, history of seizure who is brought to the emergency room with altered mental status. Found to have Sepsis, reportedly pt had negative COVID-19 test within a week, but no documentation.   OT comments  Pt. Seen for skilled PT/OT co tx to facilitate with increased safety and participation with bed mobility and adls.  Pt. Remains limited with initiation of tasks sustaining sitting. At this time able to sit un supported approx. 5-7 seconds without physical assistance to provide support.    Follow Up Recommendations  SNF    Equipment Recommendations  None recommended by OT    Recommendations for Other Services      Precautions / Restrictions Precautions Precautions: Fall Restrictions Weight Bearing Restrictions: No       Mobility Bed Mobility Overal bed mobility: Needs Assistance Bed Mobility: Rolling;Supine to Sit;Sit to Supine Rolling: +2 for physical assistance;Total assist Sidelying to sit: Total assist;+2 for physical assistance Supine to sit: Total assist;+2 for physical assistance Sit to supine: Total assist;+2 for physical assistance   General bed mobility comments: cues for sequencing; total A +2 for all aspects of bed mobility  Transfers Overall transfer level: Needs assistance   Transfers: Sit to/from Stand;Lateral/Scoot Transfers Sit to Stand: Total assist;+2 physical assistance;From elevated surface        Lateral/Scoot Transfers: Total assist;+2 physical assistance General transfer comment: deferred for patient/therapist safety; will need hoyer lift for OOB    Balance Overall balance assessment: Needs assistance Sitting-balance support: Feet  supported;Bilateral upper extremity supported Sitting balance-Leahy Scale: Poor Sitting balance - Comments: grossly max A to maintain sitting balance; at times min guard for safety however unable to tolerate challenges Postural control: Right lateral lean   Standing balance-Leahy Scale: Zero Standing balance comment: unable to stand wtih total A                           ADL either performed or assessed with clinical judgement   ADL Overall ADL's : Needs assistance/impaired     Grooming: Bed level;Total assistance;Standing Grooming Details (indicate cue type and reason): attempted bed level and supported and un supported sitting eob                               General ADL Comments: Total A for ADLs and bed mobility     Vision       Perception     Praxis      Cognition Arousal/Alertness: Awake/alert Behavior During Therapy: Flat affect Overall Cognitive Status: History of cognitive impairments - at baseline                                 General Comments: pt keeps eyes closed most of the time         Exercises     Shoulder Instructions       General Comments      Pertinent Vitals/ Pain       Pain Assessment: Faces Faces Pain Scale: Hurts even more Pain Location: hands (R hand appears more painful than L) and pain with  sitting up  Pain Descriptors / Indicators: Grimacing;Guarding;Moaning Pain Intervention(s): Limited activity within patient's tolerance;Monitored during session;Repositioned  Home Living                                          Prior Functioning/Environment              Frequency  Min 2X/week        Progress Toward Goals  OT Goals(current goals can now be found in the care plan section)  Progress towards OT goals: Progressing toward goals     Plan Discharge plan remains appropriate    Co-evaluation    PT/OT/SLP Co-Evaluation/Treatment: Yes Reason for Co-Treatment:  Necessary to address cognition/behavior during functional activity;For patient/therapist safety;To address functional/ADL transfers PT goals addressed during session: Mobility/safety with mobility;Strengthening/ROM OT goals addressed during session: ADL's and self-care      AM-PAC OT "6 Clicks" Daily Activity     Outcome Measure   Help from another person eating meals?: Total Help from another person taking care of personal grooming?: Total Help from another person toileting, which includes using toliet, bedpan, or urinal?: Total Help from another person bathing (including washing, rinsing, drying)?: Total Help from another person to put on and taking off regular upper body clothing?: Total Help from another person to put on and taking off regular lower body clothing?: Total 6 Click Score: 6    End of Session    OT Visit Diagnosis: Unsteadiness on feet (R26.81);Other abnormalities of gait and mobility (R26.89);Muscle weakness (generalized) (M62.81);Pain   Activity Tolerance Patient limited by fatigue;Patient limited by lethargy;Patient limited by pain   Patient Left in bed;with call bell/phone within reach;with bed alarm set   Nurse Communication Other (comment)(alerted rn to approx. 22mm in length skin break noted between shoulder blades. present when pt. assisted into sitting wanted rn aware. pt. also would need help finishing b.fast)        Time: 4536-4680 OT Time Calculation (min): 23 min  Charges: OT General Charges $OT Visit: 1 Visit OT Treatments $Self Care/Home Management : 8-22 mins  Robet Leu, COTA/L 02/21/2019, 10:34 AM

## 2019-02-21 NOTE — Progress Notes (Signed)
Physical Therapy Treatment Patient Details Name: Arthur Berry MRN: [id number] DOB: 03/21/1946 Today's Date: 02/21/2019    History of Present Illness JILES JACOX is a 73 y.o. male with medical history significant of type 2 diabetes on insulin and metformin, dementia, long-term nursing home resident, hypertension, history of seizure who is brought to the emergency room with altered mental status. Found to have Sepsis, reportedly pt had negative COVID-19 test within a week, but no documentation.    PT Comments    Patient seen for mobility progression. Pt continues to require total A +2 for bed mobility and will need hoyer lift for OOB. This session focused on bed mobiltiy and sitting balance EOB. Pt with more pain/edema in hands, especially R hand, this session.  Continue to progress as tolerated.    Follow Up Recommendations  SNF     Equipment Recommendations  None recommended by PT    Recommendations for Other Services       Precautions / Restrictions Precautions Precautions: Fall Restrictions Weight Bearing Restrictions: No    Mobility  Bed Mobility Overal bed mobility: Needs Assistance Bed Mobility: Rolling;Supine to Sit;Sit to Supine Rolling: +2 for physical assistance;Total assist Sidelying to sit: Total assist;+2 for physical assistance Supine to sit: Total assist;+2 for physical assistance Sit to supine: Total assist;+2 for physical assistance   General bed mobility comments: cues for sequencing; total A +2 for all aspects of bed mobility  Transfers Overall transfer level: Needs assistance   Transfers: Sit to/from Stand;Lateral/Scoot Transfers Sit to Stand: Total assist;+2 physical assistance;From elevated surface        Lateral/Scoot Transfers: Total assist;+2 physical assistance General transfer comment: deferred for patient/therapist safety; will need hoyer lift for OOB  Ambulation/Gait                 Stairs             Wheelchair  Mobility    Modified Rankin (Stroke Patients Only)       Balance Overall balance assessment: Needs assistance Sitting-balance support: Feet supported;Bilateral upper extremity supported Sitting balance-Leahy Scale: Poor Sitting balance - Comments: grossly max A to maintain sitting balance; at times pt able to maintain sitting balance for 5-7 seconds with min guard for safety however unable to tolerate challenges Postural control: Right lateral lean;Posterior lean                                  Cognition Arousal/Alertness: Awake/alert Behavior During Therapy: Flat affect Overall Cognitive Status: History of cognitive impairments - at baseline                                 General Comments: pt keeps eyes closed most of the time       Exercises      General Comments General comments (skin integrity, edema, etc.): bilat UE edema; R hand edematous and pt maintains extension due to pain attempting grip      Pertinent Vitals/Pain Pain Assessment: Faces Faces Pain Scale: Hurts even more Pain Location: hands (R hand appears more painful than L) and pain with sitting up  Pain Descriptors / Indicators: Grimacing;Guarding;Moaning Pain Intervention(s): Limited activity within patient's tolerance;Monitored during session;Repositioned    Home Living                      Prior Function  PT Goals (current goals can now be found in the care plan section) Progress towards PT goals: Not progressing toward goals - comment    Frequency    Min 2X/week      PT Plan Current plan remains appropriate    Co-evaluation PT/OT/SLP Co-Evaluation/Treatment: Yes Reason for Co-Treatment: Necessary to address cognition/behavior during functional activity;For patient/therapist safety;To address functional/ADL transfers PT goals addressed during session: Mobility/safety with mobility;Strengthening/ROM OT goals addressed during session: ADL's  and self-care      AM-PAC PT "6 Clicks" Mobility   Outcome Measure  Help needed turning from your back to your side while in a flat bed without using bedrails?: Total Help needed moving from lying on your back to sitting on the side of a flat bed without using bedrails?: Total Help needed moving to and from a bed to a chair (including a wheelchair)?: Total Help needed standing up from a chair using your arms (e.g., wheelchair or bedside chair)?: Total Help needed to walk in hospital room?: Total Help needed climbing 3-5 steps with a railing? : Total 6 Click Score: 6    End of Session Equipment Utilized During Treatment: Gait belt Activity Tolerance: Patient tolerated treatment well Patient left: in bed;with call bell/phone within reach;with bed alarm set Nurse Communication: Mobility status PT Visit Diagnosis: Other symptoms and signs involving the nervous system (R29.898);Other abnormalities of gait and mobility (R26.89);Muscle weakness (generalized) (M62.81)     Time: [id number] PT Time Calculation (min) (ACUTE ONLY): 23 min  Charges:  $Therapeutic Activity: 8-22 mins                     Erline LevineKellyn Shenoa Hattabaugh, PTA Acute Rehabilitation Services Pager: [phone] Office: [phone]     Carolynne EdouardKellyn R [place] 02/21/2019, 11:44 AM

## 2019-02-21 NOTE — Progress Notes (Signed)
Palliative Note:   Patient in bed. He will awake and respond "what" when name is called. Opens eyes briefly and then closes. Opens mouth when instructed, otherwise will not follow other commands. Tongue dry with some exudate noted. Appears comfortable with signs of distress.   Updates provided to sister, Jasmine December. She is aware patient continues to have poor response and po intake. She verbalizes understanding of overall functional condition and again expresses quality of life is more important to her and her brother over quantity. Support given. She verbalizes they are ok with plans on returning to [place] if that is the only option at discharge with hopes of having hospice support available.   All questions answered to best of my ability. Family aware to contact our team with any further questions or concerns.   The above conversation was completed via telephone due to the visitor restrictions during the COVID-19 pandemic. Thorough chart review and discussion with necessary members of the care team was completed as part of assessment.   Examination: -No acute distress, dementia at baseline, no appropriate verbal response -S1 S2 RRR, lungs clear, diminished bases bilaterally -left hip unstageable wound, dressing clean, dry, intact, no edema   Plan: -DNR/DNI -No escalation of care -D/C to SNF/Memory Care once medically stable per attending with community hospice support -PMT will continue to support as needed  Total Time: 35 min.   Greater than 50%  of this time was spent counseling and coordinating care related to the above assessment and plan.  Willette Alma, AGPCNP-BC Palliative Medicine Team  Phone: [phone] Pager: [phone] Amion: Thea Alken

## 2019-02-21 NOTE — Plan of Care (Signed)
  Problem: Clinical Measurements: Goal: Diagnostic test results will improve Outcome: Progressing Goal: Respiratory complications will improve Outcome: Progressing Goal: Cardiovascular complication will be avoided Outcome: Progressing   Problem: Pain Managment: Goal: General experience of comfort will improve Outcome: Progressing   Problem: Safety: Goal: Ability to remain free from injury will improve Outcome: Progressing   

## 2019-02-21 NOTE — NC FL2 (Addendum)
[place] MEDICAID FL2 LEVEL OF CARE SCREENING TOOL     IDENTIFICATION  Patient Name: Arthur Berry Birthdate: 28-Feb-1946 Sex: male Admission Date (Current Location): 02/13/2019  [facility] and [place] Number:  Producer, television/film/video and Address:  [facility], [address], [place], [place] [address]      Provider Number: [id number]  Attending Physician Name and Address:  Zigmund Daniel., *  Relative Name and Phone Number:  [name] (brother) [phone]    Current Level of Care: Hospital Recommended Level of Care: Memory Care Prior Approval Number:    Date Approved/Denied: 10/24/17 PASRR Number: [id number]  Discharge Plan: Other (Comment)(memory care)    Current Diagnoses: Patient Active Problem List   Diagnosis Date Noted  . Pressure injury of skin 02/14/2019  . Sepsis (HCC) 02/13/2019  . Dehydration with hypernatremia 02/13/2019  . Altered mental status 02/13/2019  . Acute metabolic encephalopathy 11/05/2018  . Hypernatremia 11/01/2018  . Status post total replacement of right hip 11/12/2017  . Closed right hip fracture, initial encounter (HCC) 10/23/2017  . Closed subcapital fracture of right femur (HCC)   . Adjustment reaction with aggression 05/14/2017  . Type 2 diabetes mellitus with other circulatory complications (HCC) 06/19/2015  . Thrombocytopenia (HCC) 06/19/2015  . Cerebral infarction due to unspecified mechanism   . Dilantin toxicity 06/18/2015  . Phenytoin toxicity   . Stroke (HCC) 06/16/2015  . Fall 06/16/2015  . Epilepsy (HCC) 06/16/2015  . BPH (benign prostatic hyperplasia) 06/16/2015  . Leukocytosis 06/16/2015  . CVA (cerebral infarction) 06/16/2015  . CVA (cerebral vascular accident) (HCC) 06/15/2015  . GERD (gastroesophageal reflux disease) 04/23/2014  . Potassium (K) deficiency 12/11/2012  . Precordial pain 12/11/2012  . Dehydration 04/15/2012  . AKI (acute kidney injury) (HCC) 04/14/2012  . HLD  (hyperlipidemia) 06/01/2010  . Essential hypertension, benign 06/01/2010  . DYSPNEA 06/01/2010  . Diabetes mellitus without complication (HCC) 11/04/2009  . RECTAL BLEEDING 11/04/2009  . PERSONAL HX COLONIC POLYPS 11/04/2009  Dementia with behavioral disturbance (HCC)                                                                          02/24/2019  Orientation RESPIRATION BLADDER Height & Weight     Self  Normal Incontinent Weight: 179 lb 7.3 oz (81.4 kg) Height:   (175.3 cm)  BEHAVIORAL SYMPTOMS/MOOD NEUROLOGICAL BOWEL NUTRITION STATUS      Incontinent Diet recommendations: Dysphagia 2 (fine chop);Nectar-thick liquid Liquids provided via: Straw;Cup Medication Administration: Whole meds with puree Supervision: Full supervision/cueing for compensatory strategies Compensations: Slow rate;Small sips/bites;Multiple dry swallows after each bite/sip Postural Changes and/or Swallow Maneuvers: Seated upright 90 degrees   AMBULATORY STATUS COMMUNICATION OF NEEDS Skin   Extensive Assist Verbally PU Stage and Appropriate Care(unstagable ulcer on hip, foam dressing changed daily)                       Personal Care Assistance Level of Assistance  Bathing, Feeding, Dressing Bathing Assistance: Maximum assistance Feeding assistance: Maximum assistance Dressing Assistance: Maximum assistance     Functional Limitations Info  Sight, Hearing, Speech Sight Info: Adequate Hearing Info: Adequate Speech Info: Adequate    SPECIAL CARE FACTORS  FREQUENCY  PT (By licensed PT), OT (By licensed OT)     PT Frequency: min 5x weekly OT Frequency: min 5x weekly            Contractures Contractures Info: Not present    Additional Factors Info  Code Status, Allergies, Insulin Sliding Scale Code Status Info: DNR Allergies Info: NKA   Insulin Sliding Scale Info: 0-9 units every 4 hours; Lantus 10 units 2x/day Isolation Precautions Info: Droplet/contact precautions     Discharge  Medications: STOP taking these medications       azithromycin 500 MG tablet Commonly known as:  ZITHROMAX   escitalopram 10 MG tablet Commonly known as:  LEXAPRO   galantamine 16 MG 24 hr capsule Commonly known as:  RAZADYNE ER   levETIRAcetam 500 MG tablet Commonly known as:  KEPPRA   LORazepam 0.5 MG tablet Commonly known as:  ATIVAN   mirtazapine 30 MG tablet Commonly known as:  REMERON   phenytoin 100 MG ER capsule Commonly known as:  DILANTIN   risperiDONE 2 MG tablet Commonly known as:  RISPERDAL             TAKE these medications       acetaminophen 500 MG tablet Commonly known as:  TYLENOL Take 500 mg by mouth every 4 (four) hours as needed for mild pain.   alum & mag hydroxide-simeth 200-200-20 MG/5ML suspension Commonly known as:  MAALOX/MYLANTA Take 30 mLs by mouth every 6 (six) hours as needed for indigestion or heartburn.   amLODipine 2.5 MG tablet Commonly known as:  NORVASC Take 2.5 mg by mouth daily.   aspirin EC 81 MG tablet Take 81 mg by mouth daily.   carvedilol 3.125 MG tablet Commonly known as:  COREG Take 3.125 mg by mouth 2 (two) times daily with a meal.   collagenase ointment Commonly known as:  SANTYL Apply topically daily.   feeding supplement (ENSURE ENLIVE) Liqd Take 237 mLs by mouth 3 (three) times daily between meals.   guaifenesin 100 MG/5ML syrup Commonly known as:  ROBITUSSIN Take 200 mg by mouth 4 (four) times daily as needed for cough.   insulin aspart 100 UNIT/ML injection Commonly known as:  novoLOG Inject 0-15 Units into the skin 4 (four) times daily -  before meals and at bedtime. Mealtime sliding scale: Blood Glucose 121 - 150: 1 units  BG 151 - 200: 2 units  BG 201 - 250: 3 units  BG 251 - 300: 5 units  BG 301 - 350: 7 units  BG 351 - 400: 9 units   Bedtime sliding scale: BG 70 - 200: 0 units  BG 201 - 250: 2 units  BG 251 - 300: 3 units  BG 301 - 350: 4 units  BG 351 - 400: 5  units What changed:    how much to take  additional instructions   insulin glargine 100 UNIT/ML injection Commonly known as:  LANTUS Inject 0.15 mLs (15 Units total) into the skin at bedtime. What changed:    how much to take  when to take this  additional instructions   lisinopril 20 MG tablet Commonly known as:  PRINIVIL,ZESTRIL Take 20 mg by mouth daily.   loperamide 2 MG capsule Commonly known as:  IMODIUM Take 2 mg by mouth every 4 (four) hours as needed for diarrhea or loose stools.   magnesium hydroxide 400 MG/5ML suspension Commonly known as:  MILK OF MAGNESIA Take 30 mLs by mouth at bedtime as needed  for mild constipation.   magnesium oxide 400 MG tablet Commonly known as:  MAG-OX Take 400 mg by mouth daily.   Melatonin 3 MG Tabs Take 6 mg by mouth at bedtime.   metFORMIN 500 MG tablet Commonly known as:  GLUCOPHAGE Take 500 mg by mouth 2 (two) times daily.   multivitamin with minerals Tabs tablet Take 1 tablet by mouth daily for 30 days.   neomycin-bacitracin-polymyxin ointment Commonly known as:  NEOSPORIN Apply 1 application topically daily as needed for wound care.   pantoprazole 40 MG tablet Commonly known as:  PROTONIX Take 1 tablet (40 mg total) by mouth daily.   phenytoin 50 MG tablet Commonly known as:  DILANTIN Chew 2 tablets (100 mg total) by mouth 2 (two) times daily for 30 days.   Resource ThickenUp Clear Powd Use as needed for nectar thick liquids   VITAMIN D3 PO Take 2,000 Units by mouth at bedtime.      Relevant Imaging Results:  Relevant Lab Results:   Additional Information SS#: [id number]  Baldemar LenisElizabeth M Paisley, LCSW

## 2019-02-21 NOTE — Procedures (Signed)
ELECTROENCEPHALOGRAM REPORT   Patient: Arthur Berry       Room #: 6N17C EEG No. ID: [id number] Age: 73 y.o.        Sex: male Referring Physician: Lowell Guitar Report Date:  02/21/2019        Interpreting Physician: Thana Farr  History: Arthur Berry is an 73 y.o. male with a history of seizures presenting with AMS  Medications:  Norvasc, ASA, Coreg, Santyl, Insulin, Lisinopril, MVI, Dilantin  Conditions of Recording:  This is a 21 channel routine scalp EEG performed with bipolar and monopolar montages arranged in accordance to the international 10/20 system of electrode placement. One channel was dedicated to EKG recording.  The patient is in the altered and at times uncooperative state.  Description:  The background activity is slow and poorly organized.  It consists of a low voltage, polymorphic delta activity that is diffusely distributed.  There is some occasional theta activity intermixed as well.   At times this activity is not continuous and short periods of attenuation are noted.  These periods are synchronous and last from 1-3 seconds.  There is some lead artifact frontally on the left but despite this there is felt to be intermittent left frontal sharp waves noted with phase reversal at F7.    Hyperventilation was not performed.  Intermittent photic stimulation was performed but failed to illicit any change in the tracing.    IMPRESSION: This is an abnormal electroencephalogram secondary to a slow, at times, discontinuous background rhythm with intermittent left frontal sharp waves noted.  This is consistent with the patient's history of seizures.     Thana Farr, MD Neurology [phone] 02/21/2019, 9:02 AM

## 2019-02-21 NOTE — Progress Notes (Signed)
Nutrition Follow-up  DOCUMENTATION CODES:   Not applicable  INTERVENTION:   -Increase Ensure Enlive po to TID, each supplement provides 350 kcal and 20 grams of protein -Increase Magic Cup TID with meals, each supplement provides 290 kcals and 9 grams protein -Hormel Shake TID with meals, each supplement provides 520 kcals and 22 grams protein -Continue MVI with minerals daily -Continue feeding assistance with meals -Pt with no documented bowel movement since admission; consider bowl regimen  NUTRITION DIAGNOSIS:   Increased nutrient needs related to wound healing as evidenced by estimated needs.  Ongoing  GOAL:   Patient will meet greater than or equal to 90% of their needs  Progressing  MONITOR:   Diet advancement, PO intake, Supplement acceptance, Labs, Weight trends, Skin, I & O's  REASON FOR ASSESSMENT:   Low Braden    ASSESSMENT:   Arthur Berry a 73 y.o.malewith medical history significant oftype 2 diabetes on insulin and metformin, dementia, long-term nursing home resident, hypertension, history of seizure who is brought to the emergency room with altered mental status. Patient is currently altered and unable to provide any history. Discussed with patient's brother who had seen him 3 weeks ago before stay home order and had no complaints. At baseline, patient is total dependent and is risk of fall on attempted ambulation. He has some behavior disturbances due to cognitive dysfunction as per his brother.  4/6- s/p BSE- advanced to dysphagia 1 diet with honey thick liquids, COVID test negative, PICC placed 4/7- s/p repeat BSE- advanced to dysphagia 1 diet with nectar thick liquids 4/9- s/p repeat BSE- advanced to dysphagia 2 diet with nectar thick liquids  Reviewed I/O's: -550 ml x 24 hours and +1.5 L since admission  UOP: 550 ml x 24 hours  Intake has declined since last visit. Noted pt consuming 0-5% of meals. Observed meal tray, open Ensure, and  opened nectar thick milk on tray table.   Per SLP notes, current diet will likely be pt's baseline.   Palliative consult noted; plan for no escalation of care and comfort measures once transitioned to SNF. Family does not desire any form or artifical feeding.   Labs reviewed: CBGS: 129-168 (0-9 units insulin aspart every 4 hours and 10 units insulin glargine BID with meals).   Diet Order:   Diet Order            DIET DYS 2 Room service appropriate? No; Fluid consistency: Nectar Thick  Diet effective now              EDUCATION NEEDS:   Not appropriate for education at this time  Skin:  Skin Assessment: Skin Integrity Issues: Skin Integrity Issues:: Unstageable Unstageable: lt hip  Last BM:  Unknown  Height:   Ht Readings from Last 1 Encounters:  02/13/19 5\' 9"  (1.753 m)    Weight:   Wt Readings from Last 1 Encounters:  02/21/19 81.4 kg    Ideal Body Weight:  72.7 kg  BMI:  Body mass index is 26.5 kg/m.  Estimated Nutritional Needs:   Kcal:  2100-2300  Protein:  110-125 grams  Fluid:  > 2.1 L    Lygia Olaes A. Mayford Knife, RD, LDN, CDCES Registered Dietitian II Certified Diabetes Care and Education Specialist Pager: [phone] After hours Pager: [phone]

## 2019-02-21 NOTE — Progress Notes (Signed)
PROGRESS NOTE    Arthur BurySamuel W Dunker  VWU:[id number] DOB: [birth year] DOA: 02/13/2019 PCP: Zeb Comfortaks, [place]   Brief Narrative:  Arthur RyderSamuel W Leckrone is Arthur Berry 73 y.o.malewith medical history significant oftype 2 diabetes on insulin and metformin, dementia with behavioral issues, HTN, seizure disorder, long-term nursing home resident, hypertension, history of seizure who is brought to the emergency room with altered mental status. At baseline, patient is total care and is risk of fall on attempted ambulation. He has some behavior disturbances due to cognitive dysfunction as per his brother.  in ED : temp 100.2, WBC 16.3, RR was up to 23, BUN 40, Cr 1.35, Glucose 323, Sodium 149, CXR showed clear lungs.   4/3- Metapneumovirus +   Assessment & Plan:   Principal Problem:   Sepsis (HCC) Active Problems:   Diabetes mellitus without complication (HCC)   HLD (hyperlipidemia)   Essential hypertension, benign   GERD (gastroesophageal reflux disease)   CVA (cerebral vascular accident) (HCC)   Epilepsy (HCC)   Acute metabolic encephalopathy   Dehydration with hypernatremia   Altered mental status   Pressure injury of skin    Sepsis due to meta pneumovirus - blood cultures, UA and Urine culture neg - no infiltrates on CXR - not hypoxic - leukocytosis improving - S/p 5 days vanc/cefepime    Dehydration with hypernatremia - has resolved with IV hydration  Acute metabolic encephalopathy with underlying dementia - due to infection and electrolyte abnormalities - unclear what his baseline is as family has not been here to see how he's doing, but sounds like he's typically disoriented and with poor short term memory (ex: he doesn't remember his meals, but does remember his siblings).  Doesn't typically know date either.  Per his sister's report, in march it seemed like his vision was very poor.  His brother noted that he does sleep [place] lot and he didn't typically say much when he visited him.  He has  persistent lethargy which has limited his ability to take meds and participate with staff.  He was awake when I entered his room today (first time this has happened, but still inconsistently following commands)  - Potentially sedating meds are on hold -> keppra and dilantin currently continued as these were PTA meds for seizures (will discuss possible adjustments with neurology) - Lexapro, Galantamine, Remeron, Risperdal and Ativan are on hold  Goals of Care: Pt lethargic since I've been taking care of him, unclear how far this is from baseline.  On review of chart, appears like this has been the case since his admission.  Inconsistent PO intake related to this.  Seen by palliative care and [facility]  Hypokalemia - replace and follow    Diabetes mellitus without complication  - Continue lantus 10 units BID  - q4 SSI until pt PO intake more reliable    Essential hypertension, benign - resume Lisinopril Amlodipine and Coreg today    GERD (gastroesophageal reflux disease) - on Protonix daily    CVA (cerebral vascular accident) - on Aspirin as outpt    Epilepsy (HCC) -cont Keppra and Depakote (will discuss dosing with neurology in setting of lethargy) - addendum, discussed with neurology, will stop keppra and continue to monitor, follow EEG - (abnormal, 2/2 slow discontinuous background rhythm with intermittent L frontal sharp waves noted.  Consistent with the patient's history of seizures).  Pt with hx sz since he was child.  Last seizure years ago.  Poor oral  intake - appreciate RD input   Dysphagia - dysphagia 2, nectar thick liquid, see speech     Pressure injury of skin  - Left hip, unstageable.  Initially had considered surgery c/s for debridement, but appears improved from initial photos taken.  Doesn't appear grossly infected or appear to need surgical debridement at this time.  Will continue to monitor.     DVT  prophylaxis: lovenox Code Status: DNR Family Communication: discussed with sister Disposition Plan: pending SNF placement  Consultants:   none  Procedures:  none  Antimicrobials:  Anti-infectives (From admission, onward)   Start     Dose/Rate Route Frequency Ordered Stop   02/17/19 1000  vancomycin (VANCOCIN) IVPB 750 mg/150 ml premix  Status:  Discontinued     750 mg 150 mL/hr over 60 Minutes Intravenous Every 12 hours 02/16/19 2233 02/18/19 0836   02/14/19 1000  ceFEPIme (MAXIPIME) 2 g in sodium chloride 0.9 % 100 mL IVPB  Status:  Discontinued     2 g 200 mL/hr over 30 Minutes Intravenous Every 24 hours 02/13/19 1214 02/14/19 0848   02/14/19 1000  vancomycin (VANCOCIN) 1,250 mg in sodium chloride 0.9 % 250 mL IVPB  Status:  Discontinued     1,250 mg 166.7 mL/hr over 90 Minutes Intravenous Every 24 hours 02/13/19 1214 02/14/19 0854   02/14/19 1000  ceFEPIme (MAXIPIME) 2 g in sodium chloride 0.9 % 100 mL IVPB  Status:  Discontinued     2 g 200 mL/hr over 30 Minutes Intravenous Every 12 hours 02/14/19 0848 02/18/19 0836   02/14/19 1000  vancomycin (VANCOCIN) 1,000 mg in sodium chloride 0.9 % 250 mL IVPB  Status:  Discontinued     1,000 mg 250 mL/hr over 60 Minutes Intravenous Every 12 hours 02/14/19 0854 02/16/19 2233   02/13/19 1000  vancomycin (VANCOCIN) IVPB 1000 mg/200 mL premix  Status:  Discontinued     1,000 mg 200 mL/hr over 60 Minutes Intravenous  Once 02/13/19 0956 02/13/19 0959   02/13/19 1000  ceFEPIme (MAXIPIME) 2 g in sodium chloride 0.9 % 100 mL IVPB     2 g 200 mL/hr over 30 Minutes Intravenous  Once 02/13/19 0956 02/13/19 1106   02/13/19 1000  vancomycin (VANCOCIN) 1,500 mg in sodium chloride 0.9 % 500 mL IVPB     1,500 mg 250 mL/hr over 120 Minutes Intravenous  Once 02/13/19 0959 02/15/19 0817     Subjective: Eyes open today. No complaints. Inconsistently following commands.  Objective: Vitals:   02/20/19 1506 02/20/19 2124 02/21/19 0430 02/21/19  0500  BP: 126/72 135/68 (!) 141/83   Pulse: 68 72 81   Resp: Temp: 98.6 F (37 C) 98.7 F (37.1 C) 99.9 F (37.7 C)   TempSrc: Oral Oral Axillary   SpO2: 99% 95% 97%   Weight:    81.4 kg  Height:        Intake/Output Summary (Last 24 hours) at 02/21/2019 1130 Last data filed at 02/21/2019 0851 Gross per 24 hour  Intake 50 ml  Output 550 ml  Net -500 ml   Filed Weights   02/18/19 0542 02/20/19 0405 02/21/19 0500  Weight: 82.6 kg 82 kg 81.4 kg    Examination:  General: No acute distress. Cardiovascular: Heart sounds show Layna Roeper regular rate, and rhythm Lungs: Clear to auscultation bilaterally Abdomen: Soft, nontender, nondistended  Neurological: eyes open, still inconsistently following commands. Skin: wound not seen today Extremities: No clubbing or cyanosis. No edema  Data Reviewed: I have personally reviewed following labs and imaging studies  CBC: Recent Labs  Lab 02/15/19 0339 02/16/19 0916 02/17/19 0650 02/18/19 0329 02/20/19 0429 02/21/19 1032  WBC 13.6* 13.3* 14.1* 12.5* 15.8* 12.4*  NEUTROABS 9.9* 9.0* 9.5* 8.3*  --   --   HGB 10.2* 10.0* 10.0* 10.3* 10.6* 10.1*  HCT 31.4* 30.3* 29.1* 31.2* 31.5* 30.2*  MCV 96.6 94.1 91.8 91.2 94.3 94.7  PLT 330 312 318 370 375 329   Basic Metabolic Panel: Recent Labs  Lab 02/17/19 0650 02/18/19 0329 02/19/19 0403 02/20/19 0429 02/21/19 1032  NA 136 138 137 140 139  K 2.9* 3.0* 3.4* 3.7 3.5  CL 103 103 101 102 100  CO2 GLUCOSE 131* 176* 283* 116* 146*  BUN 9 6* CREATININE 0.67 0.64 0.77 0.82 0.72  CALCIUM 8.1* 8.2* 8.3* 8.6* 8.5*  MG  --  1.4* 1.9 1.9  --    GFR: Estimated Creatinine Clearance: 82.2 mL/min (by C-G formula based on SCr of 0.72 mg/dL). Liver Function Tests: Recent Labs  Lab 02/20/19 0429  AST 19  ALT 22  ALKPHOS 115  BILITOT 0.3  PROT 5.2*  ALBUMIN 1.8*   No results for input(s): LIPASE, AMYLASE in the last 168 hours. No results for input(s):  AMMONIA in the last 168 hours. Coagulation Profile: No results for input(s): INR, PROTIME in the last 168 hours. Cardiac Enzymes: No results for input(s): CKTOTAL, CKMB, CKMBINDEX, TROPONINI in the last 168 hours. BNP (last 3 results) No results for input(s): PROBNP in the last 8760 hours. HbA1C: No results for input(s): HGBA1C in the last 72 hours. CBG: Recent Labs  Lab 02/20/19 1616 02/20/19 2121 02/21/19 0012 02/21/19 0426 02/21/19 0823  GLUCAP 117* 90 157* 158* 129*   Lipid Profile: No results for input(s): CHOL, HDL, LDLCALC, TRIG, CHOLHDL, LDLDIRECT in the last 72 hours. Thyroid Function Tests: No results for input(s): TSH, T4TOTAL, FREET4, T3FREE, THYROIDAB in the last 72 hours. Anemia Panel: No results for input(s): VITAMINB12, FOLATE, FERRITIN, TIBC, IRON, RETICCTPCT in the last 72 hours. Sepsis Labs: No results for input(s): PROCALCITON, LATICACIDVEN in the last 168 hours.  Recent Results (from the past 240 hour(s))  Blood Culture (routine x 2)     Status: None   Collection Time: 02/13/19 10:02 AM  Result Value Ref Range Status   Specimen Description BLOOD RIGHT ANTECUBITAL  Final   Special Requests   Final    BOTTLES DRAWN AEROBIC AND ANAEROBIC Blood Culture adequate volume   Culture   Final    NO GROWTH 5 DAYS Performed at [facility], [address], [place], [place] [address]    Report Status 02/18/2019 FINAL  Final  Blood Culture (routine x 2)     Status: None   Collection Time: 02/13/19 10:30 AM  Result Value Ref Range Status   Specimen Description BLOOD LEFT HAND  Final   Special Requests   Final    BOTTLES DRAWN AEROBIC ONLY Blood Culture results may not be optimal due to an inadequate volume of blood received in culture bottles   Culture   Final    NO GROWTH 5 DAYS Performed at [facility], [address] [place], [place] [address]    Report Status 02/18/2019 FINAL  Final  Urine culture     Status: None   Collection Time: 02/13/19  10:30 AM  Result Value Ref Range Status   Specimen Description  URINE, CLEAN CATCH  Final   Special Requests NONE  Final   Culture   Final    NO GROWTH Performed at [facility], [address], [place], [place] [address]    Report Status 02/14/2019 FINAL  Final  Novel Coronavirus, NAA (hospital order; send-out to ref lab)     Status: None   Collection Time: 02/13/19  6:30 PM  Result Value Ref Range Status   SARS-CoV-2, NAA NOT DETECTED NOT DETECTED Final    Comment: Negative (Not Detected) results do not exclude infection caused by SARS CoV 2 and should not be used as the sole basis for treatment or other patient management decisions. Optimum specimen types and timing for peak viral levels during infections caused  by SARS CoV 2 have not been determined. Collection of multiple specimens (types and time points) from the same patient may be necessary to detect the virus. Improper specimen collection and handling, sequence variability underlying assay primers and or probes, or the presence of organisms in  quantities less than the limit of detection of the assay may lead to false negative results. Positive and negative predictive values of testing are highly dependent on prevalence. False negative results are more likely when prevalence of disease is high. (NOTE) The expected result is Negative (Not Detected). The SARS CoV 2 test is intended for the presumptive qualitative  detection of nucleic acid from SARS CoV 2 in upper and lower  respir atory specimens. Testing methodology is real time RT PCR. Test results must be correlated with clinical presentation and  evaluated in the context of other laboratory and epidemiologic data.  Test performance can be affected because the epidemiology and  clinical spectrum of infection caused by SARS CoV 2 is not fully  known. For example, the optimum types of specimens to collect and  when during the course of infection these specimens are most  likely  to contain detectable viral RNA may not be known. This test has not been Food and Drug Administration (FDA) cleared or  approved and has been authorized by FDA under an Emergency Use  Authorization (EUA). The test is only authorized for the duration of  the declaration that circumstances exist justifying the authorization  of emergency use of in vitro diagnostic tests for detection and or  diagnosis of SARS CoV 2 under Section 564(b)(1) of the Act, 21 U.S.C.  section [phone] 3(b)(1), unless the authorization is terminated or   revoked sooner. [facility] is certified under the  Clinical Laboratory Improvement Amendments of 1988 (CLIA), 42 U.S.C.  section [phone], to perform high complexity tests. Performed at [facility] CLIA [id number] [address], [place], [place] [address] Laboratory Director: Turner Daniels, MD    Coronavirus Source NASOPHARYNGEAL  Final    Comment: Performed at [facility], [address] [place], [place] [address]  MRSA PCR Screening     Status: Abnormal   Collection Time: 02/13/19  8:55 PM  Result Value Ref Range Status   MRSA by PCR POSITIVE (Jenisis Harmsen) NEGATIVE Final    Comment:        The GeneXpert MRSA Assay (FDA approved for NASAL specimens only), is one component of Brenton Joines comprehensive MRSA colonization surveillance program. It is not intended to diagnose MRSA infection nor to guide or monitor treatment for MRSA infections. RESULT CALLED TO, READ BACK BY AND VERIFIED WITHGavin Potters RN 02/13/19 2337 JDW Performed at [facility]  Lab, [address], [place], [place] [address]   Respiratory Panel by PCR     Status: Abnormal   Collection Time: 02/14/19  8:08 AM  Result Value Ref Range Status   Adenovirus NOT DETECTED NOT DETECTED Final   Coronavirus 229E NOT DETECTED NOT DETECTED Final    Comment: (NOTE) The Coronavirus on the Respiratory Panel, DOES NOT test for the novel  Coronavirus  (2019 nCoV)    Coronavirus HKU1 NOT DETECTED NOT DETECTED Final   Coronavirus NL63 NOT DETECTED NOT DETECTED Final   Coronavirus OC43 NOT DETECTED NOT DETECTED Final   Metapneumovirus NOT DETECTED NOT DETECTED Final   Rhinovirus / Enterovirus DETECTED (Paytience Bures) NOT DETECTED Final   Influenza Kagan Hietpas NOT DETECTED NOT DETECTED Final   Influenza B NOT DETECTED NOT DETECTED Final   Parainfluenza Virus 1 NOT DETECTED NOT DETECTED Final   Parainfluenza Virus 2 NOT DETECTED NOT DETECTED Final   Parainfluenza Virus 3 NOT DETECTED NOT DETECTED Final   Parainfluenza Virus 4 NOT DETECTED NOT DETECTED Final   Respiratory Syncytial Virus NOT DETECTED NOT DETECTED Final   Bordetella pertussis NOT DETECTED NOT DETECTED Final   Chlamydophila pneumoniae NOT DETECTED NOT DETECTED Final   Mycoplasma pneumoniae NOT DETECTED NOT DETECTED Final    Comment: Performed at [facility], [address] [place], [place] [address]         Radiology Studies: No results found.      Scheduled Meds:  amLODipine  2.5 mg Oral Daily   aspirin EC  81 mg Oral Daily   carvedilol  3.125 mg Oral BID WC   collagenase   Topical Daily   enoxaparin (LOVENOX) injection  40 mg Subcutaneous Q24H   feeding supplement (ENSURE ENLIVE)  237 mL Oral BID BM   insulin aspart  0-9 Units Subcutaneous Q4H   insulin glargine  10 Units Subcutaneous BID   lisinopril  20 mg Oral Daily   multivitamin with minerals  1 tablet Oral Daily   pantoprazole (PROTONIX) IV  40 mg Intravenous Q24H   phenytoin (DILANTIN) IV  100 mg Intravenous Q12H   sodium chloride flush  10-40 mL Intracatheter Q12H   Continuous Infusions:  sodium chloride 75 mL/hr at 02/21/19 0626     LOS: 8 days    Time spent: over 30 min    Lacretia Nicks, MD [facility] Pager AMION  If 7PM-7AM, please contact night-coverage [email or web address] Password [email or web address] 02/21/2019, 11:30 AM

## 2019-02-22 ENCOUNTER — Inpatient Hospital Stay (HOSPITAL_COMMUNITY): Payer: Medicare Other

## 2019-02-22 LAB — GLUCOSE, CAPILLARY
Glucose-Capillary: 101 mg/dL — ABNORMAL HIGH (ref 70–99)
Glucose-Capillary: 102 mg/dL — ABNORMAL HIGH (ref 70–99)
Glucose-Capillary: 109 mg/dL — ABNORMAL HIGH (ref 70–99)
Glucose-Capillary: 111 mg/dL — ABNORMAL HIGH (ref 70–99)
Glucose-Capillary: 134 mg/dL — ABNORMAL HIGH (ref 70–99)
Glucose-Capillary: 163 mg/dL — ABNORMAL HIGH (ref 70–99)
Glucose-Capillary: 98 mg/dL (ref 70–99)

## 2019-02-22 LAB — CBC
HCT: 31.6 % — ABNORMAL LOW (ref 39.0–52.0)
Hemoglobin: 10.2 g/dL — ABNORMAL LOW (ref 13.0–17.0)
MCH: 30.4 pg (ref 26.0–34.0)
MCHC: 32.3 g/dL (ref 30.0–36.0)
MCV: 94 fL (ref 80.0–100.0)
Platelets: 320 10*3/uL (ref 150–400)
RBC: 3.36 MIL/uL — ABNORMAL LOW (ref 4.22–5.81)
RDW: 13.1 % (ref 11.5–15.5)
WBC: 11.8 10*3/uL — ABNORMAL HIGH (ref 4.0–10.5)
nRBC: 0 % (ref 0.0–0.2)

## 2019-02-22 LAB — BASIC METABOLIC PANEL
Anion gap: 11 (ref 5–15)
BUN: 7 mg/dL — ABNORMAL LOW (ref 8–23)
CO2: 29 mmol/L (ref 22–32)
Calcium: 8.6 mg/dL — ABNORMAL LOW (ref 8.9–10.3)
Chloride: 101 mmol/L (ref 98–111)
Creatinine, Ser: 0.69 mg/dL (ref 0.61–1.24)
GFR calc Af Amer: >60 mL/min (ref >60)
GFR calc non Af Amer: >60 mL/min (ref >60)
Glucose, Bld: 103 mg/dL — ABNORMAL HIGH (ref 70–99)
Potassium: 3.2 mmol/L — ABNORMAL LOW (ref 3.5–5.1)
Sodium: 141 mmol/L (ref 135–145)

## 2019-02-22 LAB — MAGNESIUM: Magnesium: 1.7 mg/dL (ref 1.7–2.4)

## 2019-02-22 MED ORDER — ENSURE ENLIVE PO LIQD: 237.0000 mL | Freq: Three times a day (TID) | ORAL | 12 refills | Status: AC

## 2019-02-22 MED ORDER — PHENYTOIN 50 MG PO CHEW: 100.0000 mg | CHEWABLE_TABLET | Freq: Two times a day (BID) | ORAL | 0 refills | Status: AC

## 2019-02-22 MED ORDER — ADULT MULTIVITAMIN W/MINERALS CH: 1.0000 | ORAL_TABLET | Freq: Every day | ORAL | 0 refills | Status: AC

## 2019-02-22 MED ORDER — RESOURCE THICKENUP CLEAR PO POWD: ORAL | 0 refills | Status: AC

## 2019-02-22 MED ORDER — PHENYTOIN 125 MG/5ML PO SUSP
100.0000 mg | Freq: Two times a day (BID) | ORAL | Status: DC
  Filled 2019-02-22: qty 4

## 2019-02-22 MED ORDER — COLLAGENASE 250 UNIT/GM EX OINT: TOPICAL_OINTMENT | Freq: Every day | CUTANEOUS | 0 refills | Status: AC

## 2019-02-22 MED ORDER — PHENYTOIN 50 MG PO CHEW
100.0000 mg | CHEWABLE_TABLET | Freq: Two times a day (BID) | ORAL | Status: DC
  Administered 2019-02-22 – 2019-02-23 (×4): 100 mg via ORAL
  Filled 2019-02-22 (×6): qty 2

## 2019-02-22 MED ORDER — POTASSIUM CHLORIDE CRYS ER 20 MEQ PO TBCR
40.0000 meq | EXTENDED_RELEASE_TABLET | ORAL | Status: AC
  Administered 2019-02-22 (×2): 40 meq via ORAL
  Filled 2019-02-22 (×2): qty 2

## 2019-02-22 NOTE — Progress Notes (Addendum)
CSW notified pt medically stable for discharge. Attempted multiple calls to [facility] to confirm discharge. Left voicemail for call back. Will facilitate discharge once confirmed. Pt's brother agrees with plan.   *Update:  Was able to reach admissions coordinator [phone]). [facility] will not be able to receive patient due to fax machine not being operational and was advised by facility supervisor, they will not to admit residents back until Monday 02/24/2019 from any medical center. CSW acknowledged and relayed to RN and brother of situation. Will continue to assist as needed.

## 2019-02-22 NOTE — Plan of Care (Signed)
  Problem: Pain Managment: Goal: General experience of comfort will improve Outcome: Progressing   

## 2019-02-22 NOTE — Discharge Summary (Addendum)
Physician Discharge Summary  Arthur Berry ZOX:[id number] DOB: 1946/11/11 DOA: 02/13/2019  PCP: Zeb Comfort  Admit date: 02/13/2019 Discharge date: 02/24/2019  Time spent: 50 minutes  Recommendations for Outpatient Follow-up:  1. Comfort measures per hospice 2. Follow up dilantin level next Wednesday  (4/15) to ensure appropriate dilantin dose 3. Continue wound care as noted below 4. Patient will need assistance feeding, please assist with meals  5. Follow blood sugars closely with inconsistent PO intake and insulin   Discharge Diagnoses:  Principal Problem:   Sepsis (HCC) Active Problems:   Diabetes mellitus without complication (HCC)   HLD (hyperlipidemia)   Essential hypertension, benign   GERD (gastroesophageal reflux disease)   CVA (cerebral vascular accident) (HCC)   Epilepsy (HCC)   Acute metabolic encephalopathy   Dehydration with hypernatremia   Altered mental status   Pressure injury of skin   Dementia with behavioral disturbance (HCC)   Discharge Condition: stable  Diet recommendation: as noted per speech recommendations below  Filed Weights   02/20/19 0405 02/21/19 0500 02/22/19 0500  Weight: 82 kg 81.4 kg 81.5 kg    History of present illness:  Arthur Square Appleis a 73 y.o.malewith medical history significant oftype 2 diabetes on insulin and metformin, dementiawith behavioral issues,HTN, seizure disorder,long-term nursing home resident, hypertension, history of seizure who is brought to the emergency room with altered mental status. At baseline, patient is totalcareand is risk of fall on attempted ambulation. He has some behavior disturbances due to cognitive dysfunction as per his brother. in ED : temp 100.2, WBC 16.3, RR was up to 23, BUN 40, Cr 1.35, Glucose 323, Sodium 149, CXR showed clear lungs.   4/3- Metapneumovirus +   He was admitted for altered mental status with concern for infection.  He had negative COVID testing.  He was positive  for metapneumovirus.  His antibiotics were discontinued after 5 days.  He had persistent lethargy during his hospitalization with poor PO intake.  His lethargy has slightly improved with discontinuing of his keppra.  He had an EEG which showed a seizure predisposition and MRI brain which showed evidence of small vessel disease/possible strokes (per discussion with neurology).  Plan at this time is discharge with hospice.   See below for additional details  Hospital Course:  Sepsis due to meta pneumovirus - blood cultures, UA and Urine culture neg - no infiltrates on CXR - not hypoxic - leukocytosis improving - S/p 5 days vanc/cefepime  Dehydration with hypernatremia - has resolved with IV hydration  Acute metabolic encephalopathy with underlying dementia - due to infection and electrolyte abnormalities - unclear what his baseline is as family has not been here to see how he's doing, but sounds like he's typically disoriented and with poor short term memory (ex: he doesn't remember his meals, but does remember his siblings).  Doesn't typically know date either.  Per his sister's report, in march it seemed like his vision was very poor.  His brother noted that he does sleep a lot and he didn't typically say much when he visited him.  He has persistent lethargy which has limited his ability to eat, take meds and participate with staff.   - He seems to have improved with discontinuing his keppra - Potentially sedating meds are on hold  - Lexapro, Galantamine, Remeron, Risperdal and Ativan are on hold - MRI with evidence of strokes/small vessel disease (per discussion with neurology, see official report)  Goals of Care: Pt lethargic since I've been taking care  of him, unclear how far this is from baseline.  On review of chart, appears like this has been the case since his admission.  Inconsistent PO intake related to this.  Seen by palliative care and plan is to eventually d/c to [facility] with hospice.  Per palliative care discussion, pt is DNR, goal for comfort once discharged with hospice support, no artificial feedings or rehospitalizations.  Hypokalemia - replace and follow  Diabetes mellitus without complication  - continue prior to admission diabetes meds - follow blood sugars closely with inconsistent PO intake  Essential hypertension, benign - resume Lisinopril Amlodipineand Coregtoday  GERD (gastroesophageal reflux disease) - on Protonix daily  CVA (cerebral vascular accident) - on Aspirin as outpt  Epilepsy (HCC) -cont dilantin  - discussed with neurology, will stop keppra and continue to monitor, follow EEG - (abnormal, 2/2 slow discontinuous background rhythm with intermittent L frontal sharp waves noted.  Consistent with the patient's history of seizures).  Pt with hx sz since he was child.  Last seizure years ago. - follow dilantin level on 4/15 to ensure adequate dose  Poor oral intake - appreciate RD input   Dysphagia - dysphagia 2, nectar thick liquid, see recs below  Pressure injury of skin - Left hip, unstageable.  Initially had considered surgery c/s for debridement, but appears improved from initial photos taken.  Doesn't appear grossly infected or appear to need surgical debridement at this time.  - continue wound care per recs below  Procedures: PICC 4/6, removed prior to d/c  EEG IMPRESSION: This is an abnormal electroencephalogram secondary to a slow, at times, discontinuous background rhythm with intermittent left frontal sharp waves noted.  This is consistent with the patient's history of seizures.   Consultations: none  Discharge Exam: Vitals:   02/23/19 2031 02/24/19 0420  BP: (!) 150/87 (!) 148/88  Pulse: 98 90  Resp: 17 18  Temp: 98.8 F (37.1 C) 98.5 F (36.9 C)  SpO2: 97% 98%   Lethargic this AM. Denies pain.  General: No acute distress. Cardiovascular: Heart sounds show a regular  rate, and rhythm.  Lungs: Clear to auscultation bilaterally  Abdomen: Soft, nontender, nondistended Neurological: Lethargic, responds to verbal stimuli  Skin: L hip decub   Discharge Instructions   Discharge Instructions    Discharge diet:   Complete by:  As directed    Diet recommendations: Dysphagia 2 (fine chop);Nectar-thick liquid Liquids provided via: Straw;Cup Medication Administration: Whole meds with puree Supervision: Full supervision/cueing for compensatory strategies Compensations: Slow rate;Small sips/bites;Multiple dry swallows after each bite/sip Postural Changes and/or Swallow Maneuvers: Seated upright 90 degrees            Oral Care Recommendations: Oral care BID Follow up Recommendations: Skilled Nursing facility SLP Visit Diagnosis: Dysphagia, oropharyngeal phase (R13.12) Plan: Continue with current plan of care   Discharge instructions   Complete by:  As directed    You were seen for concern for infection.  You had metapneumovirus on the respiratory viral panel.    Your antibiotics have been discontinued.  You had an EEG that showed a seizure predisposition (but no activity).  You had an MRI that showed evidence of strokes and small vessel disease.  You had persistent lethargy since admission.  This has improved with stopping your keppra.  Due to persistent altered mental status, poor oral intake, and lack of improvement, you were seen by palliative care.  Based on discussion with family, we're planning to get you to [facility] with  Hospice.  We're planning for discharge to [facility] with Hospice following.   Discharge wound care:   Complete by:  As directed    Wound care to left hip Unstageable pressure injury:  Cleanse with normal saline, pat gently dry. Apply a thin layer (1/8 inch) of collagenase/Santyl to the nonviable tissue. Top with saline moistened gauze 4x4 (opened). Top with dry gauze and cover with an ABD pad.  Secure  with medipore tape. Change daily. Turn patient off of left side as able.   Increase activity slowly   Complete by:  As directed      Allergies as of 02/24/2019   No Known Allergies     Medication List    STOP taking these medications   azithromycin 500 MG tablet Commonly known as:  ZITHROMAX   escitalopram 10 MG tablet Commonly known as:  LEXAPRO   galantamine 16 MG 24 hr capsule Commonly known as:  RAZADYNE ER   levETIRAcetam 500 MG tablet Commonly known as:  KEPPRA   LORazepam 0.5 MG tablet Commonly known as:  ATIVAN   mirtazapine 30 MG tablet Commonly known as:  REMERON   phenytoin 100 MG ER capsule Commonly known as:  DILANTIN   risperiDONE 2 MG tablet Commonly known as:  RISPERDAL     TAKE these medications   acetaminophen 500 MG tablet Commonly known as:  TYLENOL Take 500 mg by mouth every 4 (four) hours as needed for mild pain.   alum & mag hydroxide-simeth 200-200-20 MG/5ML suspension Commonly known as:  MAALOX/MYLANTA Take 30 mLs by mouth every 6 (six) hours as needed for indigestion or heartburn.   amLODipine 2.5 MG tablet Commonly known as:  NORVASC Take 2.5 mg by mouth daily.   aspirin EC 81 MG tablet Take 81 mg by mouth daily.   carvedilol 3.125 MG tablet Commonly known as:  COREG Take 3.125 mg by mouth 2 (two) times daily with a meal.   collagenase ointment Commonly known as:  SANTYL Apply topically daily.   feeding supplement (ENSURE ENLIVE) Liqd Take 237 mLs by mouth 3 (three) times daily between meals.   guaifenesin 100 MG/5ML syrup Commonly known as:  ROBITUSSIN Take 200 mg by mouth 4 (four) times daily as needed for cough.   insulin aspart 100 UNIT/ML injection Commonly known as:  novoLOG Inject 0-15 Units into the skin 4 (four) times daily -  before meals and at bedtime. Mealtime sliding scale: Blood Glucose 121 - 150: 1 units  BG 151 - 200: 2 units  BG 201 - 250: 3 units  BG 251 - 300: 5 units  BG 301 - 350: 7 units  BG  351 - 400: 9 units   Bedtime sliding scale: BG 70 - 200: 0 units  BG 201 - 250: 2 units  BG 251 - 300: 3 units  BG 301 - 350: 4 units  BG 351 - 400: 5 units What changed:    how much to take  additional instructions   insulin glargine 100 UNIT/ML injection Commonly known as:  LANTUS Inject 0.15 mLs (15 Units total) into the skin at bedtime. What changed:    how much to take  when to take this  additional instructions   lisinopril 20 MG tablet Commonly known as:  PRINIVIL,ZESTRIL Take 20 mg by mouth daily.   loperamide 2 MG capsule Commonly known as:  IMODIUM Take 2 mg by mouth every 4 (four) hours as needed for diarrhea or loose stools.   magnesium  hydroxide 400 MG/5ML suspension Commonly known as:  MILK OF MAGNESIA Take 30 mLs by mouth at bedtime as needed for mild constipation.   magnesium oxide 400 MG tablet Commonly known as:  MAG-OX Take 400 mg by mouth daily.   Melatonin 3 MG Tabs Take 6 mg by mouth at bedtime.   metFORMIN 500 MG tablet Commonly known as:  GLUCOPHAGE Take 500 mg by mouth 2 (two) times daily.   multivitamin with minerals Tabs tablet Take 1 tablet by mouth daily for 30 days.   neomycin-bacitracin-polymyxin ointment Commonly known as:  NEOSPORIN Apply 1 application topically daily as needed for wound care.   pantoprazole 40 MG tablet Commonly known as:  PROTONIX Take 1 tablet (40 mg total) by mouth daily.   phenytoin 50 MG tablet Commonly known as:  DILANTIN Chew 2 tablets (100 mg total) by mouth 2 (two) times daily for 30 days.   Resource ThickenUp Clear Powd Use as needed for nectar thick liquids   VITAMIN D3 PO Take 2,000 Units by mouth at bedtime.            Discharge Care Instructions  (From admission, onward)         Start     Ordered   02/22/19 0000  Discharge wound care:    Comments:  Wound care to left hip Unstageable pressure injury:  Cleanse with normal saline, pat gently dry. Apply a thin layer (1/8 inch)  of collagenase/Santyl to the nonviable tissue. Top with saline moistened gauze 4x4 (opened). Top with dry gauze and cover with an ABD pad.  Secure with medipore tape. Change daily. Turn patient off of left side as able.   02/22/19 0952         No Known Allergies    The results of significant diagnostics from this hospitalization (including imaging, microbiology, ancillary and laboratory) are listed below for reference.    Significant Diagnostic Studies: Ct Head Wo Contrast  Result Date: 02/13/2019 CLINICAL DATA:  AMS- unclear causeLess responsive, double pneumonia 1 week ago EXAM: CT HEAD WITHOUT CONTRAST TECHNIQUE: Contiguous axial images were obtained from the base of the skull through the vertex without intravenous contrast. COMPARISON:  12/22/2027 and older exams. FINDINGS: Brain: There are no parenchymal masses, no evidence of a recent infarct and no intracranial hemorrhage. There are no extra-axial masses. Developmental anomalies including agenesis of the corpus callosum, large left sided interhemispheric cyst and ventriculomegaly are unchanged. White matter hypoattenuation consistent with chronic microvascular ischemic change is also stable. Vascular: No hyperdense vessel or unexpected calcification. Skull: Normal. Negative for fracture or focal lesion. Sinuses/Orbits: Globes and orbits are unremarkable. There is mucosal thickening with dependent fluid in the left maxillary sphenoid and left frontal sinuses and posterior ethmoid air cells. Additional mucosal thickening is noted along the remaining ethmoid air cells and in the posterior right maxillary sinus. Many of the left mastoid air cells are opacified with fluid attenuation. Clear right mastoid air cells. Other: None. IMPRESSION: 1. No acute intracranial abnormalities. 2. Chronic changes from developmental anomalies and white matter changes consistent with chronic microvascular ischemic change, stable from the prior exam. 3. No significant  sinus disease including air-fluid levels. Acute sinusitis should be considered in the proper clinical setting. Electronically Signed   By: Amie Portland M.D.   On: 02/13/2019 11:43   [name]  Result Date: 02/22/2019 CLINICAL DATA:  Initial evaluation for acute altered mental status, history of dementia, seizure disorder. EXAM: MRI HEAD WITHOUT CONTRAST TECHNIQUE: Multiplanar,  multiecho pulse sequences of the brain and surrounding structures were obtained without intravenous contrast. COMPARISON:  Comparison made with prior CT from 02/13/2019 as well as MRI from 11/01/2018. FINDINGS: Brain: Chronic changes including agenesis of the corpus callosum with large left-sided inter hemispheric cyst and cerebellar atrophy again seen. Associated colpocephaly with unchanged size and configuration of the ventricles. Underlying chronic cerebral white matter changes noted. Small remote right thalamic lacunar infarct with associated chronic hemosiderin staining. Additional punctate focus of chronic microhemorrhage noted within the left periatrial white matter. There is ill-defined diffusion abnormality involving the left frontal cortical gray matter, with involvement of the underlying subcortical and periventricular white matter (series 5, image 100). 11 mm focus of more intense diffusion abnormality seen within the underlying subcortical white matter of this region (series 5, image 98). Inferior extension into the inferior left frontal region (series 5, image 87). Associated T2/FLAIR signal abnormality with loss of gray-white matter differentiation seen within this region (series 9, image 23). No significant regional mass effect. Changes are nonspecific, but could reflect sequelae of acute to early subacute ischemia or seizure. Few scattered foci of petechial hemorrhage seen within this region without frank hemorrhagic transformation (series 12, image 40). No other evidence for acute or subacute ischemia.  Gray-white matter differentiation otherwise maintained. No mass lesion or midline shift. No extra-axial fluid collection. Pituitary gland suprasellar region normal. Vascular: Major intracranial vascular flow voids are maintained at the skull base. Skull and upper cervical spine: Craniocervical junction within normal limits. Multilevel cervical spondylolysis noted within the upper cervical spine with mild stenosis at C3-4 and C4-5. Bone marrow signal intensity within normal limits. No scalp soft tissue abnormality. Sinuses/Orbits: Globes and orbital soft tissues demonstrate no acute finding. Moderate mucosal thickening with air-fluid levels noted within the sphenoid sinuses bilaterally. Mucosal thickening noted within the posterior ethmoidal air cells as well, left greater than right. Left mastoid effusion with fluid signal within the left middle ear cavity. No visible abnormality at the left nasopharynx. Other: None. IMPRESSION: 1. Scattered diffusion abnormality involving the cortical and subcortical anterior left frontal lobe as above. Findings are nonspecific, and could reflect sequelae of seizure or acute to subacute ischemia. Correlation with symptomatology and EEG suggested. Associated scattered small volume petechial hemorrhage within this region without hemorrhagic transformation. 2. Additional chronic changes as above with underlying with agenesis of the corpus callosum with associated inter hemispheric cyst and chronic cerebellar atrophy. 3. Acute sphenoid ethmoidal sinusitis. 4. Left mastoid effusion with associated middle ear effusion. Clinical correlation for possible otomastoiditis recommended. Electronically Signed   By: Rise Mu M.D.   On: 02/22/2019 01:29   Dg Chest Port 1 View  Result Date: 02/13/2019 CLINICAL DATA:  Shortness of breath and fever. Altered mental status. EXAM: PORTABLE CHEST 1 VIEW COMPARISON:  11/01/2018 FINDINGS: Artifact overlies the chest. Heart size is normal.  Mediastinal shadows show chronic mass-effect upon the trachea displacing it towards the right, most likely secondary to thyroid goiter. The lungs are clear by radiography. No consolidation or collapse. No effusions. IMPRESSION: No active disease. Electronically Signed   By: Paulina Fusi M.D.   On: 02/13/2019 10:53   [place] Ekg Site Rite  Result Date: 02/17/2019 If Site Rite image not attached, placement could not be confirmed due to current cardiac rhythm.   Microbiology: No results found for this or any previous visit (from the past 240 hour(s)).   Labs: Basic Metabolic Panel: Recent Labs  Lab 02/18/19 0329 02/19/19 0403 02/20/19 0429 02/21/19 1032 02/22/19  0502 02/24/19 0309  NA 138 137 140 139 141 139  K 3.0* 3.4* 3.7 3.5 3.2* 3.2*  CL 103 101 102 100 101 100  CO2 27 29 29 29 29 30   GLUCOSE 176* 283* 116* 146* 103* 142*  BUN 6* 10 14 10  7* 7*  CREATININE 0.64 0.77 0.82 0.72 0.69 0.71  CALCIUM 8.2* 8.3* 8.6* 8.5* 8.6* 8.5*  MG 1.4* 1.9 1.9  --  1.7 1.7   Liver Function Tests: Recent Labs  Lab 02/20/19 0429  AST 19  ALT 22  ALKPHOS 115  BILITOT 0.3  PROT 5.2*  ALBUMIN 1.8*   No results for input(s): LIPASE, AMYLASE in the last 168 hours. No results for input(s): AMMONIA in the last 168 hours. CBC: Recent Labs  Lab 02/18/19 0329 02/20/19 0429 02/21/19 1032 02/22/19 0502 02/24/19 0309  WBC 12.5* 15.8* 12.4* 11.8* 11.5*  NEUTROABS 8.3*  --   --   --   --   HGB 10.3* 10.6* 10.1* 10.2* 11.1*  HCT 31.2* 31.5* 30.2* 31.6* 33.1*  MCV 91.2 94.3 94.7 94.0 93.5  PLT 370 375 329 320 325   Cardiac Enzymes: No results for input(s): CKTOTAL, CKMB, CKMBINDEX, TROPONINI in the last 168 hours. BNP: BNP (last 3 results) No results for input(s): BNP in the last 8760 hours.  ProBNP (last 3 results) No results for input(s): PROBNP in the last 8760 hours.  CBG: Recent Labs  Lab 02/23/19 1758 02/23/19 2029 02/24/19 0014 02/24/19 0419 02/24/19 0828  GLUCAP 104* 225* 203*  82 91       Signed:  Lacretia Nicksaldwell Powell MD.  [facility] 02/24/2019, 10:45 AM

## 2019-02-22 NOTE — Plan of Care (Signed)

## 2019-02-23 LAB — GLUCOSE, CAPILLARY
Glucose-Capillary: 107 mg/dL — ABNORMAL HIGH (ref 70–99)
Glucose-Capillary: 72 mg/dL (ref 70–99)
Glucose-Capillary: 135 mg/dL — ABNORMAL HIGH (ref 70–99)
Glucose-Capillary: 104 mg/dL — ABNORMAL HIGH (ref 70–99)
Glucose-Capillary: 225 mg/dL — ABNORMAL HIGH (ref 70–99)

## 2019-02-23 NOTE — Progress Notes (Signed)
Pt discharged yesterday.  Seen at bedside today, eating with nurse tech.  Occasionally spitting out some of his food. 10% of meal eaten, charted. Denies any pain.  Continues to be more interactive than previous days, but still with some lethargy and needs to be stimulated to interact. Unable to d/c to [facility] over the weekend, plan for d/c Tomorrow on Monday. See d/c summary from 4/11.

## 2019-02-24 DIAGNOSIS — F0391 Unspecified dementia with behavioral disturbance: Secondary | ICD-10-CM

## 2019-02-24 DIAGNOSIS — F03918 Unspecified dementia, unspecified severity, with other behavioral disturbance: Secondary | ICD-10-CM

## 2019-02-24 LAB — MAGNESIUM: Magnesium: 1.7 mg/dL (ref 1.7–2.4)

## 2019-02-24 LAB — BASIC METABOLIC PANEL
Anion gap: 9 (ref 5–15)
BUN: 7 mg/dL — ABNORMAL LOW (ref 8–23)
CO2: 30 mmol/L (ref 22–32)
Calcium: 8.5 mg/dL — ABNORMAL LOW (ref 8.9–10.3)
Chloride: 100 mmol/L (ref 98–111)
Creatinine, Ser: 0.71 mg/dL (ref 0.61–1.24)
GFR calc Af Amer: >60 mL/min (ref >60)
GFR calc non Af Amer: >60 mL/min (ref >60)
Glucose, Bld: 142 mg/dL — ABNORMAL HIGH (ref 70–99)
Potassium: 3.2 mmol/L — ABNORMAL LOW (ref 3.5–5.1)
Sodium: 139 mmol/L (ref 135–145)

## 2019-02-24 LAB — CBC
HCT: 33.1 % — ABNORMAL LOW (ref 39.0–52.0)
Hemoglobin: 11.1 g/dL — ABNORMAL LOW (ref 13.0–17.0)
MCH: 31.4 pg (ref 26.0–34.0)
MCHC: 33.5 g/dL (ref 30.0–36.0)
MCV: 93.5 fL (ref 80.0–100.0)
Platelets: 325 10*3/uL (ref 150–400)
RBC: 3.54 MIL/uL — ABNORMAL LOW (ref 4.22–5.81)
RDW: 13.2 % (ref 11.5–15.5)
WBC: 11.5 10*3/uL — ABNORMAL HIGH (ref 4.0–10.5)
nRBC: 0 % (ref 0.0–0.2)

## 2019-02-24 LAB — GLUCOSE, CAPILLARY
Glucose-Capillary: 203 mg/dL — ABNORMAL HIGH (ref 70–99)
Glucose-Capillary: 82 mg/dL (ref 70–99)
Glucose-Capillary: 91 mg/dL (ref 70–99)

## 2019-02-24 MED ORDER — POTASSIUM CHLORIDE CRYS ER 20 MEQ PO TBCR
40.0000 meq | EXTENDED_RELEASE_TABLET | Freq: Once | ORAL | Status: AC
  Administered 2019-02-24: 40 meq via ORAL
  Filled 2019-02-24: qty 2

## 2019-02-24 NOTE — Discharge Instructions (Signed)
Dehydration, Adult    Dehydration is a condition in which there is not enough fluid or water in the body. This happens when you lose more fluids than you take in. Important organs, such as the kidneys, brain, and heart, cannot function without a proper amount of fluids. Any loss of fluids from the body can lead to dehydration.  Dehydration can range from mild to severe. This condition should be treated right away to prevent it from becoming severe.  What are the causes?  This condition may be caused by:   Vomiting.   Diarrhea.   Excessive sweating, such as from heat exposure or exercise.   Not drinking enough fluid, especially:  ? When ill.  ? While doing activity that requires a lot of energy.   Excessive urination.   Fever.   Infection.   Certain medicines, such as medicines that cause the body to lose excess fluid (diuretics).   Inability to access safe drinking water.   Reduced physical ability to get adequate water and food.  What increases the risk?  This condition is more likely to develop in people:   Who have a poorly controlled long-term (chronic) illness, such as diabetes, heart disease, or kidney disease.   Who are age 65 or older.   Who are disabled.   Who live in a place with high altitude.   Who play endurance sports.  What are the signs or symptoms?  Symptoms of mild dehydration may include:   Thirst.   Dry lips.   Slightly dry mouth.   Dry, warm skin.   Dizziness.  Symptoms of moderate dehydration may include:   Very dry mouth.   Muscle cramps.   Dark urine. Urine may be the color of tea.   Decreased urine production.   Decreased tear production.   Heartbeat that is irregular or faster than normal (palpitations).   Headache.   Light-headedness, especially when you stand up from a sitting position.   Fainting (syncope).  Symptoms of severe dehydration may include:   Changes in skin, such as:  ? Cold and clammy skin.  ? Blotchy (mottled) or pale skin.  ? Skin that does  not quickly return to normal after being lightly pinched and released (poor skin turgor).   Changes in body fluids, such as:  ? Extreme thirst.  ? No tear production.  ? Inability to sweat when body temperature is high, such as in hot weather.  ? Very little urine production.   Changes in vital signs, such as:  ? Weak pulse.  ? Pulse that is more than 100 beats a minute when sitting still.  ? Rapid breathing.  ? Low blood pressure.   Other changes, such as:  ? Sunken eyes.  ? Cold hands and feet.  ? Confusion.  ? Lack of energy (lethargy).  ? Difficulty waking up from sleep.  ? Short-term weight loss.  ? Unconsciousness.  How is this diagnosed?  This condition is diagnosed based on your symptoms and a physical exam. Blood and urine tests may be done to help confirm the diagnosis.  How is this treated?  Treatment for this condition depends on the severity. Mild or moderate dehydration can often be treated at home. Treatment should be started right away. Do not wait until dehydration becomes severe. Severe dehydration is an emergency and it needs to be treated in a hospital.  Treatment for mild dehydration may include:   Drinking more fluids.   Replacing salts and minerals in   your blood (electrolytes) that you may have lost.  Treatment for moderate dehydration may include:   Drinking an oral rehydration solution (ORS). This is a drink that helps you replace fluids and electrolytes (rehydrate). It can be found at pharmacies and retail stores.  Treatment for severe dehydration may include:   Receiving fluids through an IV tube.   Receiving an electrolyte solution through a feeding tube that is passed through your nose and into your stomach (nasogastric tube, or NG tube).   Correcting any abnormalities in electrolytes.   Treating the underlying cause of dehydration.  Follow these instructions at home:   If directed by your health care provider, drink an ORS:  ? Make an ORS by following instructions on the  package.  ? Start by drinking small amounts, about  cup (120 mL) every 5-10 minutes.  ? Slowly increase how much you drink until you have taken the amount recommended by your health care provider.   Drink enough clear fluid to keep your urine clear or pale yellow. If you were told to drink an ORS, finish the ORS first, then start slowly drinking other clear fluids. Drink fluids such as:  ? Water. Do not drink only water. Doing that can lead to having too little salt (sodium) in the body (hyponatremia).  ? Ice chips.  ? Fruit juice that you have added water to (diluted fruit juice).  ? Low-calorie sports drinks.   Avoid:  ? Alcohol.  ? Drinks that contain a lot of sugar. These include high-calorie sports drinks, fruit juice that is not diluted, and soda.  ? Caffeine.  ? Foods that are greasy or contain a lot of fat or sugar.   Take over-the-counter and prescription medicines only as told by your health care provider.   Do not take sodium tablets. This can lead to having too much sodium in the body (hypernatremia).   Eat foods that contain a healthy balance of electrolytes, such as bananas, oranges, potatoes, tomatoes, and spinach.   Keep all follow-up visits as told by your health care provider. This is important.  Contact a health care provider if:   You have abdominal pain that:  ? Gets worse.  ? Stays in one area (localizes).   You have a rash.   You have a stiff neck.   You are more irritable than usual.   You are sleepier or more difficult to wake up than usual.   You feel weak or dizzy.   You feel very thirsty.   You have urinated only a small amount of very dark urine over 6-8 hours.  Get help right away if:   You have symptoms of severe dehydration.   You cannot drink fluids without vomiting.   Your symptoms get worse with treatment.   You have a fever.   You have a severe headache.   You have vomiting or diarrhea that:  ? Gets worse.  ? Does not go away.   You have blood or green matter  (bile) in your vomit.   You have blood in your stool. This may cause stool to look black and tarry.   You have not urinated in 6-8 hours.   You faint.   Your heart rate while sitting still is over 100 beats a minute.   You have trouble breathing.  This information is not intended to replace advice given to you by your health care provider. Make sure you discuss any questions you have with your health care   provider.  Document Released: 10/30/2005 Document Revised: 05/26/2016 Document Reviewed: 12/24/2015  Elsevier Interactive Patient Education  2019 [employer]  Hyponatremia  Hyponatremia is when the amount of salt (sodium) in your blood is too low. When sodium levels are low, your cells absorb extra water and they swell. The swelling happens throughout the body, but it mostly affects the brain.  What are the causes?  This condition may be caused by:   Heart, kidney, or liver problems.   Thyroid problems.   Adrenal gland problems.   Metabolic conditions, such as syndrome of inappropriate antidiuretic hormone (SIADH).   Severe vomiting and diarrhea.   Certain medicines or illegal drugs.   Dehydration.   Drinking too much water.   Eating a diet that is low in sodium.   Large burns on your body.   Sweating.  What increases the risk?  This condition is more likely to develop in people who:   Have long-term (chronic) kidney disease.   Have heart failure.   Have a medical condition that causes frequent or excessive diarrhea.   Have metabolic conditions, such as Addison disease or SIADH.   Take certain medicines that affect the sodium and fluid balance in the blood. Some of these medicine types include:  ? Diuretics.  ? NSAIDs.  ? Some opioid pain medicines.  ? Some antidepressants.  ? Some seizure prevention medicines.  What are the signs or symptoms?  Symptoms of this condition include:   Nausea and  vomiting.   Confusion.   Lethargy.   Agitation.   Headache.   Seizures.   Unconsciousness.   Appetite loss.   Muscle weakness and cramping.   Feeling weak or light-headed.   Having a rapid heart rate.   Fainting, in severe cases.  How is this diagnosed?  This condition is diagnosed with a medical history and physical exam. You will also have other tests, including:   Blood tests.   Urine tests.  How is this treated?  Treatment for this condition depends on the cause. Treatment may include:   Fluids given through an IV tube that is inserted into one of your veins.   Medicines to correct the sodium imbalance. If medicines are causing the condition, the medicines will need to be adjusted.   Limiting water or fluid intake to get the correct sodium balance.  Follow these instructions at home:   Take medicines only as directed by your health care provider. Many medicines can make this condition worse. Talk with your health care provider about any medicines that you are currently taking.   Carefully follow a recommended diet as directed by your health care provider.   Carefully follow instructions from your health care provider about fluid restrictions.   Keep all follow-up visits as directed by your health care provider. This is important.   Do not drink alcohol.  Contact a health care provider if:   You develop worsening nausea, fatigue, headache, confusion, or weakness.   Your symptoms go away and then return.   You have problems following the recommended diet.  Get help right away if:   You have a seizure.   You faint.   You have ongoing diarrhea or vomiting.  This information is not intended to replace advice given to you by your health care provider. Make sure you discuss any questions you have with your health care provider.  Document Released: 10/20/2002 Document Revised: 04/06/2016 Document Reviewed: 11/19/2014  Elsevier Interactive Patient Education  2019 [employer] Inc[employer]

## 2019-02-24 NOTE — TOC Transition Note (Addendum)
Transition of Care [facility]) - CM/SW Discharge Note   Patient Details  Name: Arthur Berry MRN: [id number] Date of Birth: September 25, 1946  [facility] CM/SW Contact:  Gildardo Griffes, LCSW Phone Number: 02/24/2019, 10:58 AM   Clinical Narrative:     Patient will DC to: [facility] Anticipated DC date: 02/24/2019 Family notified: [name] Transport by: Sharin Mons  Per MD patient ready for DC to [facility]. RN, patient, patient's family, and facility notified of DC. Discharge Summary sent to facility. No number for report as this is an ALF. DC packet on chart. Ambulance transport requested for patient. [facility] to follow patient for hospice needs, aware of discharge today. CSW signing off.  [place], [place] [phone]   Final next level of care: Assisted Living(hospice to follow) Barriers to Discharge: No Barriers Identified   Patient Goals and CMS Choice Patient states their goals for this hospitalization and ongoing recovery are:: to go back to ALF with hospice CMS Medicare.gov Compare Post Acute Care list provided to:: Patient Represenative (must comment)([name] (brother)) Choice offered to / list presented to : Sibling  Discharge Placement PASRR number recieved: 02/21/19            Patient chooses bed at: [facility] Patient to be transferred to facility by: [facility] Name of family member notified: [name] Patient and family notified of of transfer: 02/24/19  Discharge Plan and Services In-house Referral: Clinical Social Work   Post Acute Care Choice: Skilled Nursing Facility          DME Arranged: N/A DME Agency: NA HH Arranged: NA([facility] will arrange) HH Agency: NA   Social Determinants of Health (SDOH) Interventions     Readmission Risk Interventions Readmission Risk Prevention Plan 02/14/2019  Transportation Screening Complete  Medication Review [employer]) Complete  PCP or Specialist appointment within 3-5 days of discharge  Complete  HRI or Home Care Consult Not Complete  HRI or Home Care Consult Pt Refusal Comments N/A  SW Recovery Care/Counseling Consult Complete  Palliative Care Screening Not Applicable  Skilled Nursing Facility Complete  Some recent data might be hidden

## 2019-02-24 NOTE — Progress Notes (Signed)
Physical Therapy Treatment Patient Details Name: Arthur Berry MRN: [id number] DOB: 1946/04/21 Today's Date: 02/24/2019    History of Present Illness Arthur Berry is a 73 y.o. male with medical history significant of type 2 diabetes on insulin and metformin, dementia, long-term nursing home resident, hypertension, history of seizure who is brought to the emergency room with altered mental status. Found to have Sepsis, reportedly pt had negative COVID-19 test within a week, but no documentation.    PT Comments    Patient seen for activity progression. Session focused on Bilateral extremity ROM, sitting tolerance and trunk control work at EOB. Patient tolerated well but minimally engaged throughout session. Modest if any progression towards goals. Current POC remains appropriate.   Follow Up Recommendations  SNF     Equipment Recommendations  None recommended by PT    Recommendations for Other Services       Precautions / Restrictions Precautions Precautions: Fall Restrictions Weight Bearing Restrictions: No    Mobility  Bed Mobility Overal bed mobility: Needs Assistance Bed Mobility: Rolling;Supine to Sit;Sit to Supine Rolling: +2 for physical assistance;Total assist   Supine to sit: Total assist;+2 for physical assistance Sit to supine: Total assist;+2 for physical assistance   General bed mobility comments: VCs for sequencing; total A +2 for all aspects of bed mobility  Transfers                 General transfer comment: deferred for patient safety  Ambulation/Gait                 Stairs             Wheelchair Mobility    Modified Rankin (Stroke Patients Only)       Balance Overall balance assessment: Needs assistance Sitting-balance support: Feet supported;Bilateral upper extremity supported Sitting balance-Leahy Scale: Poor Sitting balance - Comments: Max assist at EOB, tolerated trunk ROM and stretch ~ 6 minutes Postural control:  Right lateral lean;Posterior lean                                  Cognition Arousal/Alertness: Awake/alert Behavior During Therapy: Flat affect Overall Cognitive Status: History of cognitive impairments - at baseline                                 General Comments: pt keeps eyes closed most of the time       Exercises      General Comments        Pertinent Vitals/Pain Pain Assessment: Faces Faces Pain Scale: Hurts little more Pain Location: generalized Pain Descriptors / Indicators: Grimacing;Guarding;Moaning Pain Intervention(s): Monitored during session    Home Living                      Prior Function            PT Goals (current goals can now be found in the care plan section) Acute Rehab PT Goals Patient Stated Goal: family agreeable to SNF [name] per chart PT Goal Formulation: With family Time For Goal Achievement: 03/04/19 Potential to Achieve Goals: Fair Progress towards PT goals: Not progressing toward goals - comment    Frequency    Min 2X/week      PT Plan Current plan remains appropriate    Co-evaluation  AM-PAC PT "6 Clicks" Mobility   Outcome Measure  Help needed turning from your back to your side while in a flat bed without using bedrails?: Total Help needed moving from lying on your back to sitting on the side of a flat bed without using bedrails?: Total Help needed moving to and from a bed to a chair (including a wheelchair)?: Total Help needed standing up from a chair using your arms (e.g., wheelchair or bedside chair)?: Total Help needed to walk in hospital room?: Total Help needed climbing 3-5 steps with a railing? : Total 6 Click Score: 6    End of Session Equipment Utilized During Treatment: Gait belt Activity Tolerance: Patient tolerated treatment well Patient left: in bed;with call bell/phone within reach;with bed alarm set Nurse Communication:  Mobility status PT Visit Diagnosis: Other symptoms and signs involving the nervous system (R29.898);Other abnormalities of gait and mobility (R26.89);Muscle weakness (generalized) (M62.81)     Time: [id number] PT Time Calculation (min) (ACUTE ONLY): 16 min  Charges:  $Therapeutic Activity: 8-22 mins                     Charlotte Crumbevon Wauneta Silveria, PT DPT  Board Certified Neurologic Specialist Acute Rehabilitation Services Pager [phone] Office [phone]    Arthur Berry 02/24/2019, 8:51 AM

## 2019-02-25 DIAGNOSIS — G300 Alzheimer's disease with early onset: Secondary | ICD-10-CM | POA: Diagnosis not present

## 2019-02-25 DIAGNOSIS — G40909 Epilepsy, unspecified, not intractable, without status epilepticus: Secondary | ICD-10-CM | POA: Diagnosis not present

## 2019-02-25 DIAGNOSIS — S7291XA Unspecified fracture of right femur, initial encounter for closed fracture: Secondary | ICD-10-CM | POA: Diagnosis not present

## 2019-02-25 DIAGNOSIS — K219 Gastro-esophageal reflux disease without esophagitis: Secondary | ICD-10-CM | POA: Diagnosis not present

## 2019-02-25 DIAGNOSIS — G4700 Insomnia, unspecified: Secondary | ICD-10-CM | POA: Diagnosis not present

## 2019-02-25 DIAGNOSIS — F0150 Vascular dementia without behavioral disturbance: Secondary | ICD-10-CM | POA: Diagnosis not present

## 2019-02-25 DIAGNOSIS — R509 Fever, unspecified: Secondary | ICD-10-CM | POA: Diagnosis not present

## 2019-02-25 DIAGNOSIS — L89309 Pressure ulcer of unspecified buttock, unspecified stage: Secondary | ICD-10-CM | POA: Diagnosis not present

## 2019-02-25 DIAGNOSIS — R05 Cough: Secondary | ICD-10-CM | POA: Diagnosis not present

## 2019-02-25 DIAGNOSIS — I129 Hypertensive chronic kidney disease with stage 1 through stage 4 chronic kidney disease, or unspecified chronic kidney disease: Secondary | ICD-10-CM | POA: Diagnosis not present

## 2019-02-25 DIAGNOSIS — N189 Chronic kidney disease, unspecified: Secondary | ICD-10-CM | POA: Diagnosis not present

## 2019-02-25 DIAGNOSIS — G40509 Epileptic seizures related to external causes, not intractable, without status epilepticus: Secondary | ICD-10-CM | POA: Diagnosis not present

## 2019-02-25 DIAGNOSIS — E11649 Type 2 diabetes mellitus with hypoglycemia without coma: Secondary | ICD-10-CM | POA: Diagnosis not present

## 2019-02-25 DIAGNOSIS — E785 Hyperlipidemia, unspecified: Secondary | ICD-10-CM | POA: Diagnosis not present

## 2019-02-25 DIAGNOSIS — I2510 Atherosclerotic heart disease of native coronary artery without angina pectoris: Secondary | ICD-10-CM | POA: Diagnosis not present

## 2019-02-25 DIAGNOSIS — I69351 Hemiplegia and hemiparesis following cerebral infarction affecting right dominant side: Secondary | ICD-10-CM | POA: Diagnosis not present

## 2019-02-25 DIAGNOSIS — L89229 Pressure ulcer of left hip, unspecified stage: Secondary | ICD-10-CM | POA: Diagnosis not present

## 2019-02-25 DIAGNOSIS — I10 Essential (primary) hypertension: Secondary | ICD-10-CM | POA: Diagnosis not present

## 2019-02-25 DIAGNOSIS — E1122 Type 2 diabetes mellitus with diabetic chronic kidney disease: Secondary | ICD-10-CM | POA: Diagnosis not present

## 2019-02-25 DIAGNOSIS — Z741 Need for assistance with personal care: Secondary | ICD-10-CM | POA: Diagnosis not present

## 2019-02-26 DIAGNOSIS — E870 Hyperosmolality and hypernatremia: Secondary | ICD-10-CM | POA: Diagnosis not present

## 2019-02-26 DIAGNOSIS — D649 Anemia, unspecified: Secondary | ICD-10-CM | POA: Diagnosis not present

## 2019-02-26 DIAGNOSIS — R569 Unspecified convulsions: Secondary | ICD-10-CM | POA: Diagnosis not present

## 2019-02-26 DIAGNOSIS — Z79899 Other long term (current) drug therapy: Secondary | ICD-10-CM | POA: Diagnosis not present

## 2019-02-27 DIAGNOSIS — N189 Chronic kidney disease, unspecified: Secondary | ICD-10-CM | POA: Diagnosis not present

## 2019-02-27 DIAGNOSIS — I129 Hypertensive chronic kidney disease with stage 1 through stage 4 chronic kidney disease, or unspecified chronic kidney disease: Secondary | ICD-10-CM | POA: Diagnosis not present

## 2019-02-27 DIAGNOSIS — F0150 Vascular dementia without behavioral disturbance: Secondary | ICD-10-CM | POA: Diagnosis not present

## 2019-02-27 DIAGNOSIS — E1122 Type 2 diabetes mellitus with diabetic chronic kidney disease: Secondary | ICD-10-CM | POA: Diagnosis not present

## 2019-02-27 DIAGNOSIS — G40909 Epilepsy, unspecified, not intractable, without status epilepticus: Secondary | ICD-10-CM | POA: Diagnosis not present

## 2019-02-27 DIAGNOSIS — I69351 Hemiplegia and hemiparesis following cerebral infarction affecting right dominant side: Secondary | ICD-10-CM | POA: Diagnosis not present

## 2019-03-04 DIAGNOSIS — F321 Major depressive disorder, single episode, moderate: Secondary | ICD-10-CM | POA: Diagnosis not present

## 2019-03-04 DIAGNOSIS — R419 Unspecified symptoms and signs involving cognitive functions and awareness: Secondary | ICD-10-CM | POA: Diagnosis not present

## 2019-03-04 DIAGNOSIS — F5105 Insomnia due to other mental disorder: Secondary | ICD-10-CM | POA: Diagnosis not present

## 2019-03-04 DIAGNOSIS — F323 Major depressive disorder, single episode, severe with psychotic features: Secondary | ICD-10-CM | POA: Diagnosis not present

## 2019-03-05 DIAGNOSIS — E1122 Type 2 diabetes mellitus with diabetic chronic kidney disease: Secondary | ICD-10-CM | POA: Diagnosis not present

## 2019-03-05 DIAGNOSIS — M79675 Pain in left toe(s): Secondary | ICD-10-CM | POA: Diagnosis not present

## 2019-03-05 DIAGNOSIS — M79674 Pain in right toe(s): Secondary | ICD-10-CM | POA: Diagnosis not present

## 2019-03-05 DIAGNOSIS — F0150 Vascular dementia without behavioral disturbance: Secondary | ICD-10-CM | POA: Diagnosis not present

## 2019-03-05 DIAGNOSIS — D649 Anemia, unspecified: Secondary | ICD-10-CM | POA: Diagnosis not present

## 2019-03-05 DIAGNOSIS — G40909 Epilepsy, unspecified, not intractable, without status epilepticus: Secondary | ICD-10-CM | POA: Diagnosis not present

## 2019-03-05 DIAGNOSIS — B351 Tinea unguium: Secondary | ICD-10-CM | POA: Diagnosis not present

## 2019-03-05 DIAGNOSIS — I129 Hypertensive chronic kidney disease with stage 1 through stage 4 chronic kidney disease, or unspecified chronic kidney disease: Secondary | ICD-10-CM | POA: Diagnosis not present

## 2019-03-05 DIAGNOSIS — R569 Unspecified convulsions: Secondary | ICD-10-CM | POA: Diagnosis not present

## 2019-03-05 DIAGNOSIS — N189 Chronic kidney disease, unspecified: Secondary | ICD-10-CM | POA: Diagnosis not present

## 2019-03-05 DIAGNOSIS — Z79899 Other long term (current) drug therapy: Secondary | ICD-10-CM | POA: Diagnosis not present

## 2019-03-05 DIAGNOSIS — I69351 Hemiplegia and hemiparesis following cerebral infarction affecting right dominant side: Secondary | ICD-10-CM | POA: Diagnosis not present

## 2019-03-05 DIAGNOSIS — E870 Hyperosmolality and hypernatremia: Secondary | ICD-10-CM | POA: Diagnosis not present

## 2019-03-07 DIAGNOSIS — G40909 Epilepsy, unspecified, not intractable, without status epilepticus: Secondary | ICD-10-CM | POA: Diagnosis not present

## 2019-03-07 DIAGNOSIS — F0150 Vascular dementia without behavioral disturbance: Secondary | ICD-10-CM | POA: Diagnosis not present

## 2019-03-07 DIAGNOSIS — N189 Chronic kidney disease, unspecified: Secondary | ICD-10-CM | POA: Diagnosis not present

## 2019-03-07 DIAGNOSIS — E1122 Type 2 diabetes mellitus with diabetic chronic kidney disease: Secondary | ICD-10-CM | POA: Diagnosis not present

## 2019-03-07 DIAGNOSIS — I129 Hypertensive chronic kidney disease with stage 1 through stage 4 chronic kidney disease, or unspecified chronic kidney disease: Secondary | ICD-10-CM | POA: Diagnosis not present

## 2019-03-07 DIAGNOSIS — I69351 Hemiplegia and hemiparesis following cerebral infarction affecting right dominant side: Secondary | ICD-10-CM | POA: Diagnosis not present

## 2019-03-10 DIAGNOSIS — N189 Chronic kidney disease, unspecified: Secondary | ICD-10-CM | POA: Diagnosis not present

## 2019-03-10 DIAGNOSIS — F0150 Vascular dementia without behavioral disturbance: Secondary | ICD-10-CM | POA: Diagnosis not present

## 2019-03-10 DIAGNOSIS — E1122 Type 2 diabetes mellitus with diabetic chronic kidney disease: Secondary | ICD-10-CM | POA: Diagnosis not present

## 2019-03-10 DIAGNOSIS — G40909 Epilepsy, unspecified, not intractable, without status epilepticus: Secondary | ICD-10-CM | POA: Diagnosis not present

## 2019-03-10 DIAGNOSIS — I69351 Hemiplegia and hemiparesis following cerebral infarction affecting right dominant side: Secondary | ICD-10-CM | POA: Diagnosis not present

## 2019-03-10 DIAGNOSIS — I129 Hypertensive chronic kidney disease with stage 1 through stage 4 chronic kidney disease, or unspecified chronic kidney disease: Secondary | ICD-10-CM | POA: Diagnosis not present

## 2019-03-14 DEATH — deceased

## 2020-04-08 IMAGING — CR DG FEMUR 2+V*R*
4 series · 4 of 4 positions shown · non-contrast
Comparison: 11/21/2017

CLINICAL DATA: Un witnessed fall with right leg pain, initial
encounter

EXAM:
RIGHT FEMUR 2 VIEWS

[x femur proximal ap right (1 of 4)]
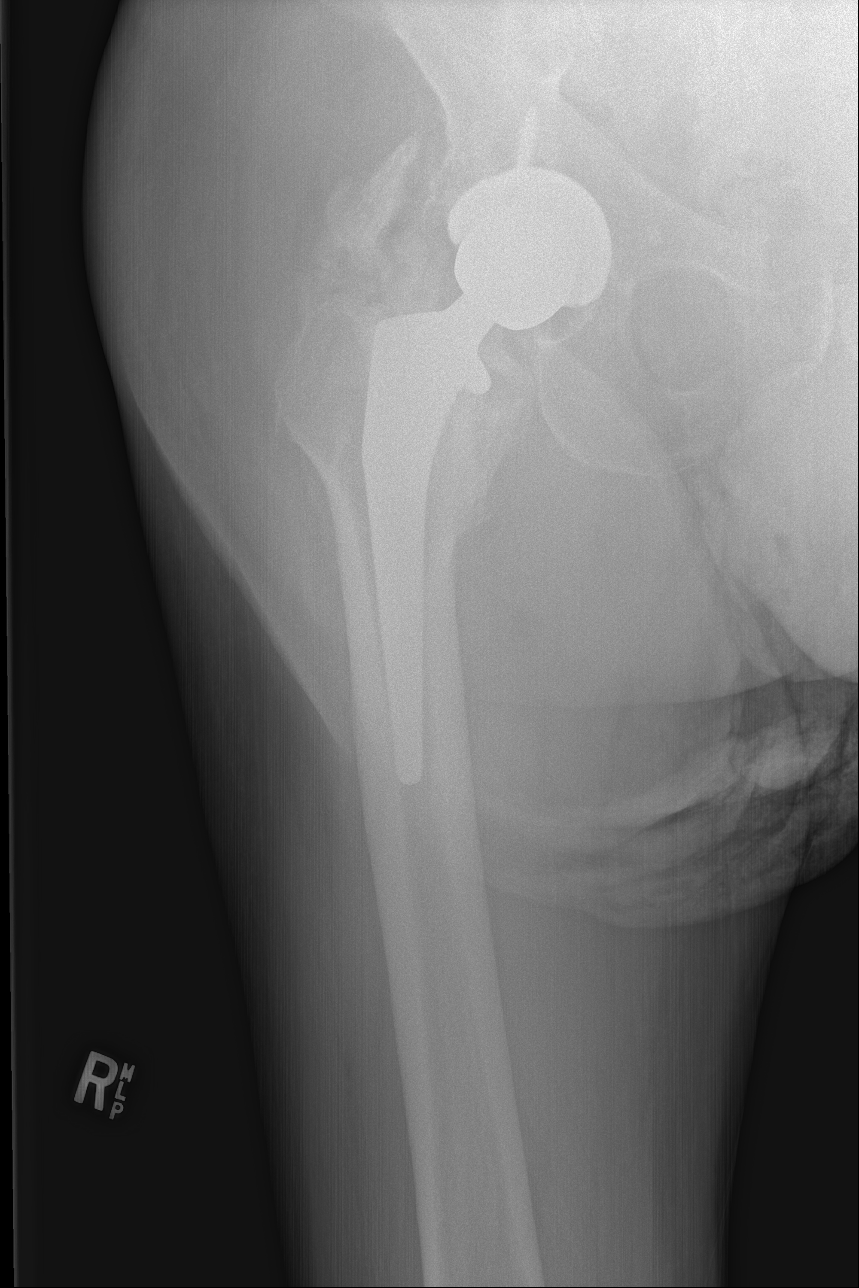

[x femur proximal ap right (2 of 4)]
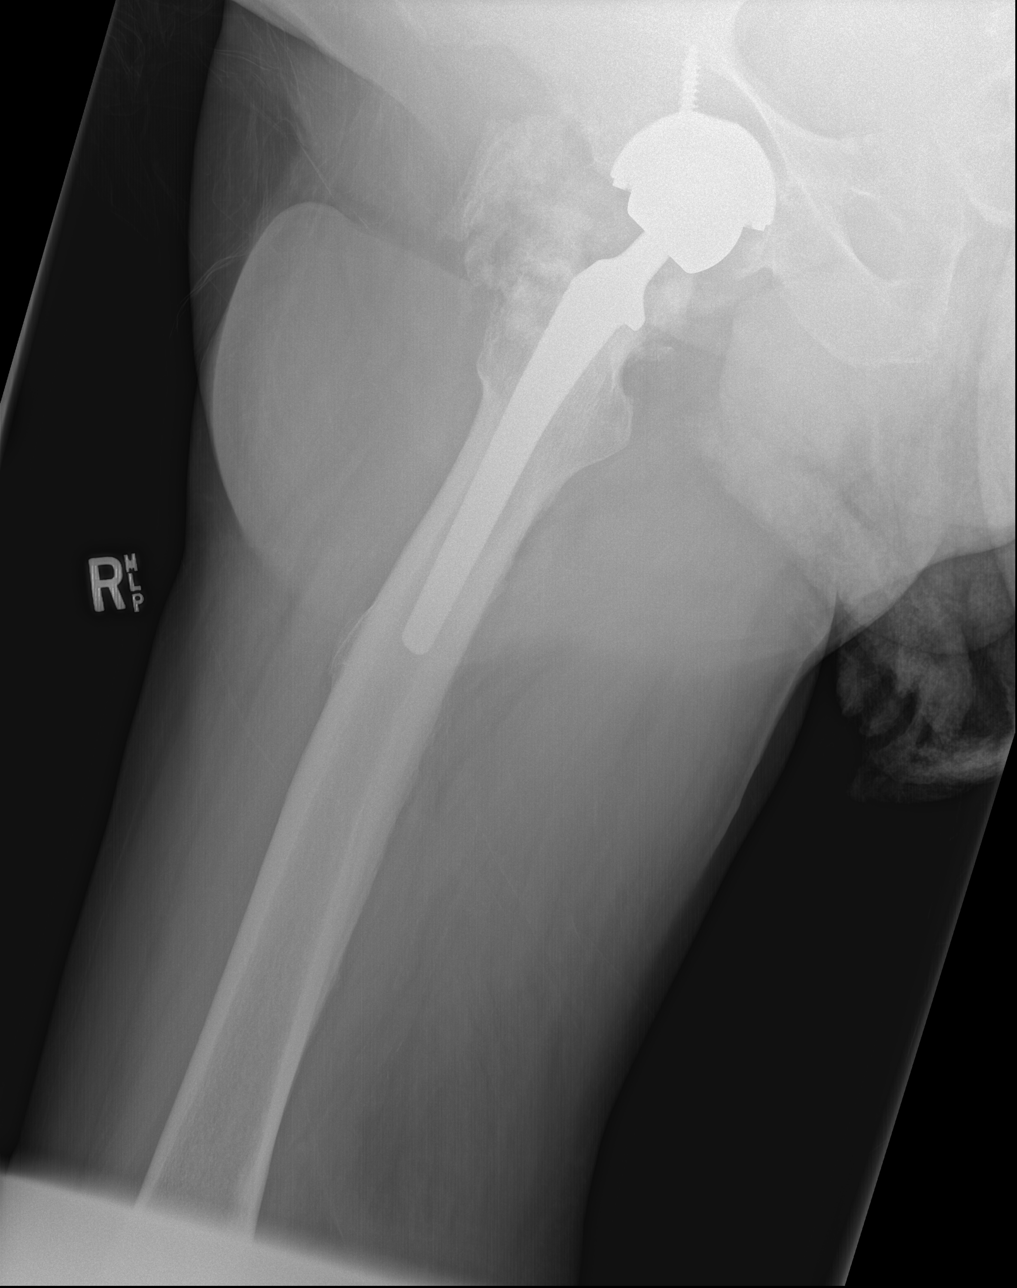

[x femur proximal ap right (3 of 4)]
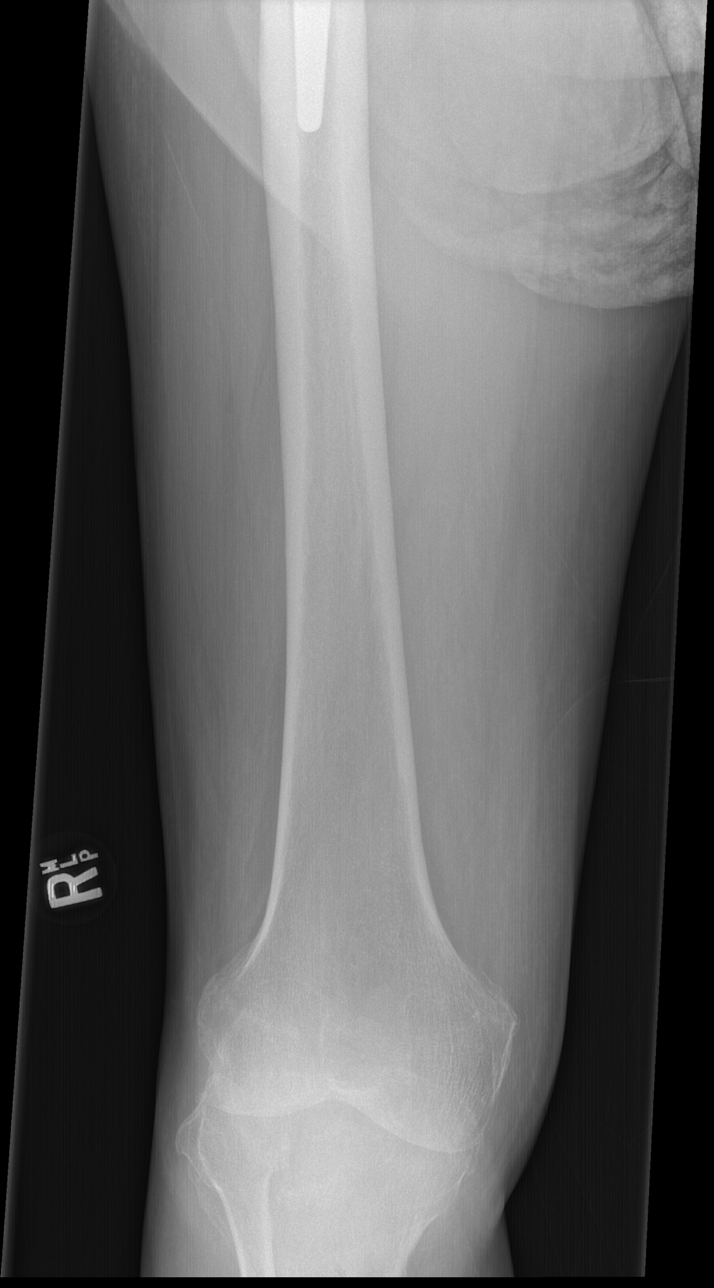

[x femur proximal ap right (4 of 4)]
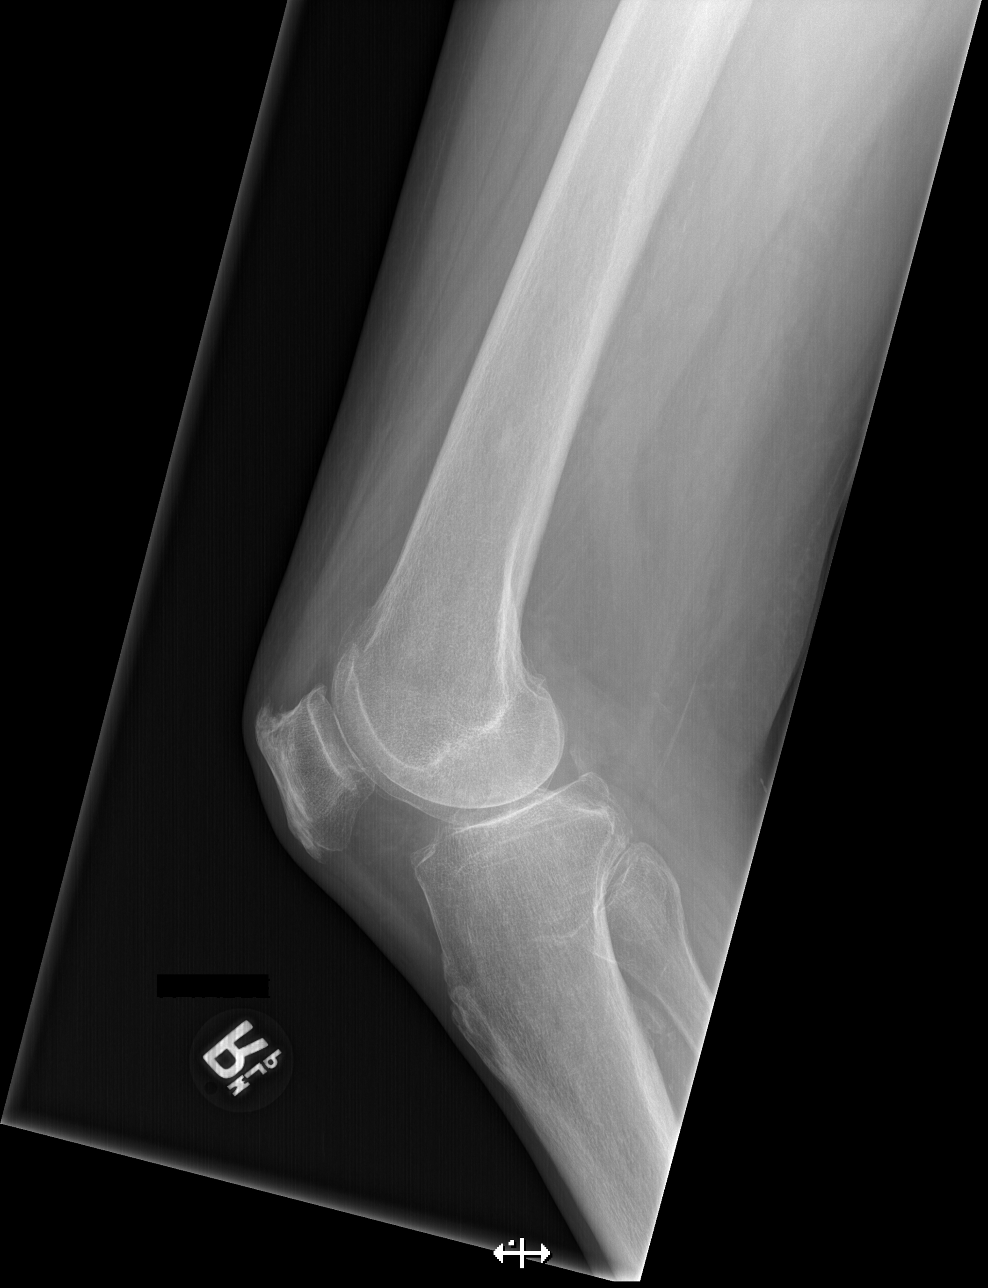

[4 of 4 positions shown; findings below may reference images not displayed]

FINDINGS: Right hip prosthesis is noted. Some dystrophic calcifications are
noted along the hip joint increased from the prior exam. No
loosening or dislocation is seen. The more distal femur is within
normal limits. Degenerative changes about the knee joint are noted.
No soft tissue abnormality is seen.
IMPRESSION: No acute abnormality noted.
